# Patient Record
Sex: Male | Born: 1960 | Race: Black or African American | Hispanic: No | Marital: Single | Smoking: Never smoker
Health system: Southern US, Community
[De-identification: ages and names within clinical notes are randomized; demographics above are authoritative.]

## PROBLEM LIST (undated history)

## (undated) ENCOUNTER — Encounter

## (undated) ENCOUNTER — Encounter: Attending: Gastroenterology | Primary: Gastroenterology

## (undated) ENCOUNTER — Non-Acute Institutional Stay: Payer: PRIVATE HEALTH INSURANCE | Attending: Gastroenterology | Primary: Gastroenterology

## (undated) ENCOUNTER — Telehealth

## (undated) ENCOUNTER — Ambulatory Visit: Payer: PRIVATE HEALTH INSURANCE | Attending: Gastroenterology | Primary: Gastroenterology

## (undated) ENCOUNTER — Encounter: Payer: PRIVATE HEALTH INSURANCE | Attending: Gastroenterology | Primary: Gastroenterology

## (undated) ENCOUNTER — Ambulatory Visit

## (undated) ENCOUNTER — Telehealth: Attending: Gastroenterology | Primary: Gastroenterology

## (undated) ENCOUNTER — Ambulatory Visit: Payer: PRIVATE HEALTH INSURANCE

## (undated) ENCOUNTER — Ambulatory Visit: Attending: Gastroenterology | Primary: Gastroenterology

## (undated) DIAGNOSIS — I10 Essential (primary) hypertension: Secondary | ICD-10-CM

## (undated) DIAGNOSIS — I610 Nontraumatic intracerebral hemorrhage in hemisphere, subcortical: Secondary | ICD-10-CM

## (undated) DIAGNOSIS — Z973 Presence of spectacles and contact lenses: Secondary | ICD-10-CM

## (undated) DIAGNOSIS — Z8719 Personal history of other diseases of the digestive system: Secondary | ICD-10-CM

## (undated) DIAGNOSIS — IMO0002 Reserved for concepts with insufficient information to code with codable children: Secondary | ICD-10-CM

## (undated) DIAGNOSIS — M1990 Unspecified osteoarthritis, unspecified site: Secondary | ICD-10-CM

## (undated) DIAGNOSIS — K219 Gastro-esophageal reflux disease without esophagitis: Secondary | ICD-10-CM

## (undated) DIAGNOSIS — E785 Hyperlipidemia, unspecified: Secondary | ICD-10-CM

## (undated) HISTORY — DX: Reserved for concepts with insufficient information to code with codable children: IMO0002

## (undated) HISTORY — PX: APPENDECTOMY: SHX54

## (undated) HISTORY — PX: OTHER SURGICAL HISTORY: SHX169

## (undated) HISTORY — DX: Nontraumatic intracerebral hemorrhage in hemisphere, subcortical: I61.0

## (undated) HISTORY — DX: Gastro-esophageal reflux disease without esophagitis: K21.9

## (undated) HISTORY — DX: Hyperlipidemia, unspecified: E78.5

## (undated) HISTORY — PX: COLECTOMY: SHX59

## (undated) HISTORY — DX: Personal history of other diseases of the digestive system: Z87.19

## (undated) HISTORY — PX: HERNIA REPAIR: SHX51

## (undated) HISTORY — DX: Essential (primary) hypertension: I10

## (undated) HISTORY — DX: Unspecified osteoarthritis, unspecified site: M19.90

---
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
## 1997-11-23 ENCOUNTER — Inpatient Hospital Stay (HOSPITAL_COMMUNITY): Admission: EM | Admit: 1997-11-23 | Discharge: 1997-11-26 | Payer: Self-pay | Admitting: Emergency Medicine

## 1998-01-14 ENCOUNTER — Emergency Department (HOSPITAL_COMMUNITY): Admission: EM | Admit: 1998-01-14 | Discharge: 1998-01-14 | Payer: Self-pay | Admitting: Emergency Medicine

## 1998-01-14 ENCOUNTER — Ambulatory Visit (HOSPITAL_COMMUNITY): Admission: RE | Admit: 1998-01-14 | Discharge: 1998-01-14 | Payer: Self-pay | Admitting: General Surgery

## 1998-09-23 ENCOUNTER — Emergency Department (HOSPITAL_COMMUNITY): Admission: EM | Admit: 1998-09-23 | Discharge: 1998-09-23 | Payer: Self-pay | Admitting: Internal Medicine

## 1998-12-17 ENCOUNTER — Emergency Department (HOSPITAL_COMMUNITY): Admission: EM | Admit: 1998-12-17 | Discharge: 1998-12-17 | Payer: Self-pay | Admitting: Emergency Medicine

## 1999-02-25 ENCOUNTER — Encounter (INDEPENDENT_AMBULATORY_CARE_PROVIDER_SITE_OTHER): Payer: Self-pay | Admitting: Specialist

## 1999-02-25 ENCOUNTER — Other Ambulatory Visit: Admission: RE | Admit: 1999-02-25 | Discharge: 1999-02-25 | Payer: Self-pay | Admitting: Gastroenterology

## 2004-12-14 ENCOUNTER — Emergency Department (HOSPITAL_COMMUNITY): Admission: EM | Admit: 2004-12-14 | Discharge: 2004-12-14 | Payer: Self-pay | Admitting: Emergency Medicine

## 2004-12-20 ENCOUNTER — Inpatient Hospital Stay (HOSPITAL_COMMUNITY): Admission: EM | Admit: 2004-12-20 | Discharge: 2004-12-27 | Payer: Self-pay | Admitting: Emergency Medicine

## 2005-01-03 ENCOUNTER — Ambulatory Visit (HOSPITAL_COMMUNITY): Admission: AD | Admit: 2005-01-03 | Discharge: 2005-01-03 | Payer: Self-pay | Admitting: *Deleted

## 2005-02-22 ENCOUNTER — Emergency Department (HOSPITAL_COMMUNITY): Admission: EM | Admit: 2005-02-22 | Discharge: 2005-02-23 | Payer: Self-pay | Admitting: Emergency Medicine

## 2005-02-27 ENCOUNTER — Encounter: Admission: RE | Admit: 2005-02-27 | Discharge: 2005-02-27 | Payer: Self-pay | Admitting: *Deleted

## 2005-03-01 ENCOUNTER — Emergency Department (HOSPITAL_COMMUNITY): Admission: EM | Admit: 2005-03-01 | Discharge: 2005-03-01 | Payer: Self-pay | Admitting: Emergency Medicine

## 2005-05-20 ENCOUNTER — Encounter: Admission: RE | Admit: 2005-05-20 | Discharge: 2005-05-20 | Payer: Self-pay | Admitting: *Deleted

## 2005-06-12 ENCOUNTER — Encounter: Admission: RE | Admit: 2005-06-12 | Discharge: 2005-06-12 | Payer: Self-pay | Admitting: Surgery

## 2006-03-02 IMAGING — CR DG CHEST 1V PORT
1 series · 1 of 1 positions shown · non-contrast
Comparison: None

CLINICAL DATA: PICC placement

PORTABLE CHEST - 1 VIEW:

[view not recorded]
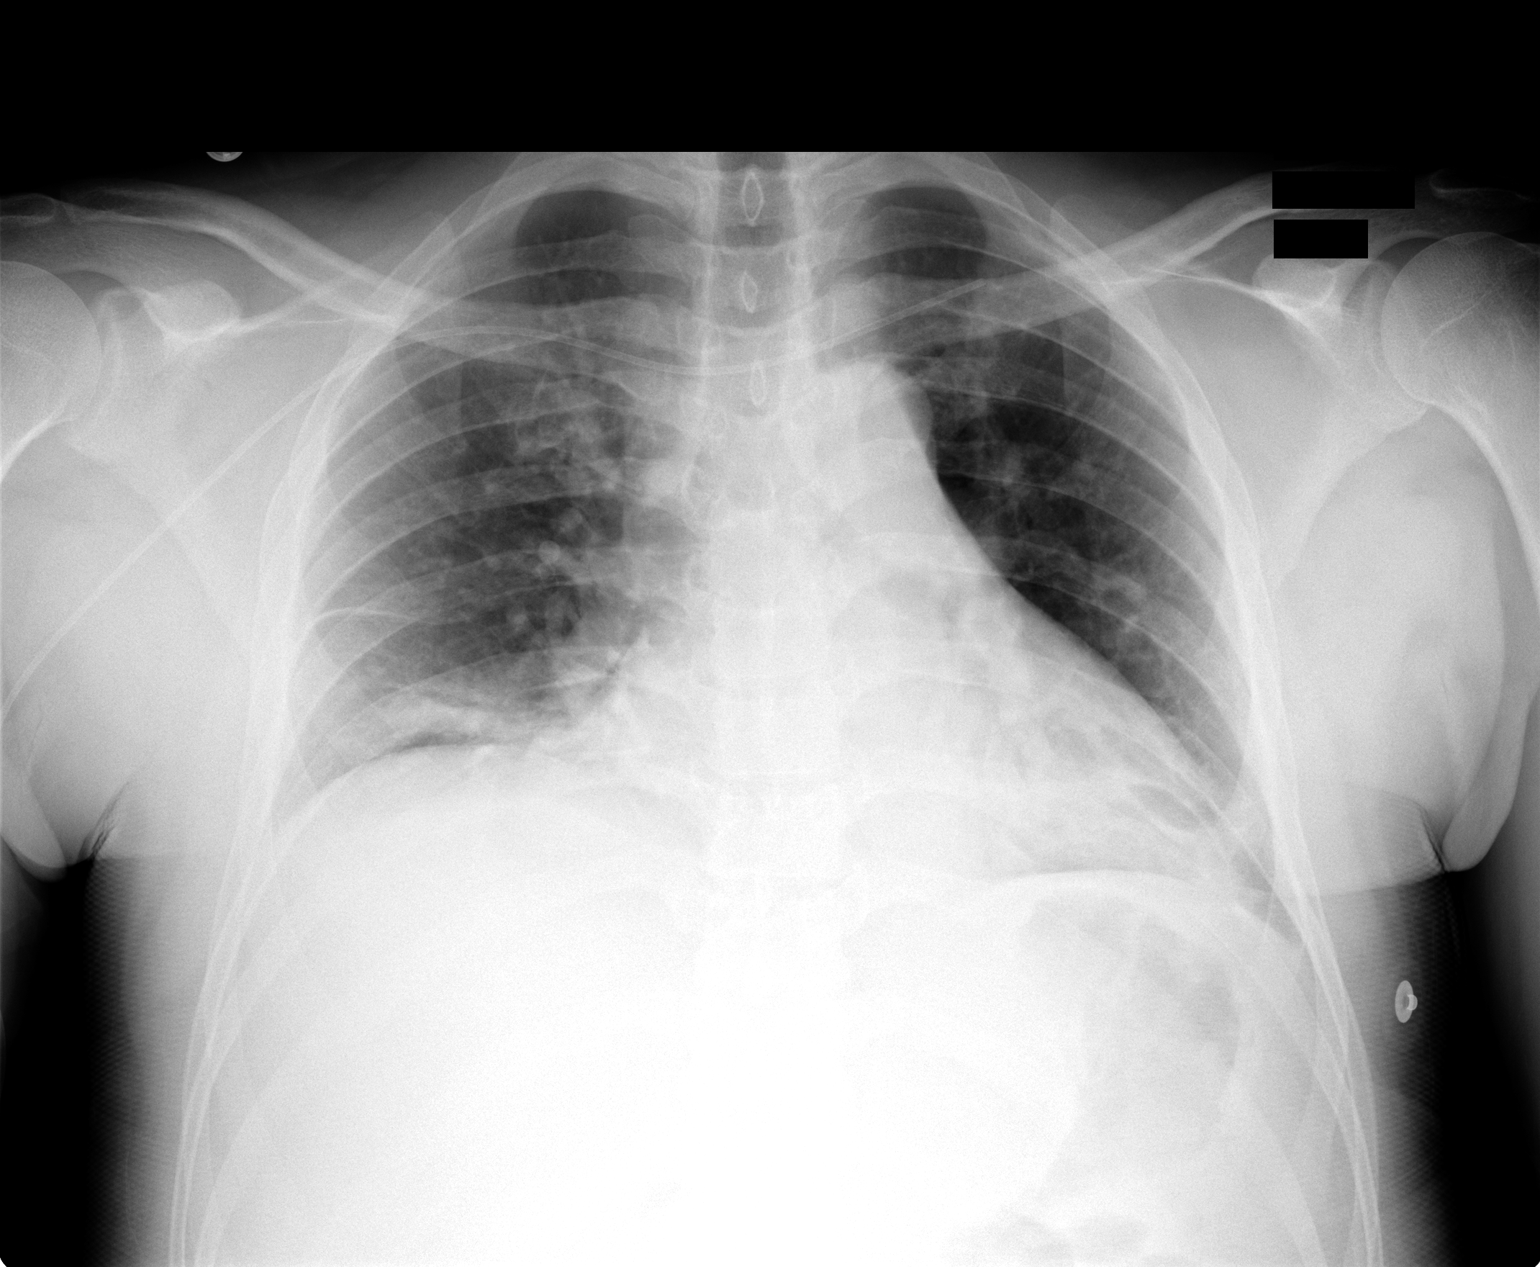

[1 of 1 positions shown; findings below may reference images not displayed]

FINDINGS: Right PICC line is in place. This crosses the midline and the tip
lies in the left subclavian vein. There is cardiomegaly. Bibasilar atelectasis
or infiltrates present.
IMPRESSION: Right PICC line crosses the midline with the tip in the left subclavian vein.
Recommend pulling back 12-15 cm and readvancing.

Bibasilar atelectasis or pneumonia. Cardiomegaly.

## 2006-06-22 ENCOUNTER — Emergency Department (HOSPITAL_COMMUNITY): Admission: EM | Admit: 2006-06-22 | Discharge: 2006-06-22 | Payer: Self-pay | Admitting: Emergency Medicine

## 2006-08-17 ENCOUNTER — Emergency Department (HOSPITAL_COMMUNITY): Admission: EM | Admit: 2006-08-17 | Discharge: 2006-08-17 | Payer: Self-pay | Admitting: Emergency Medicine

## 2006-08-23 ENCOUNTER — Emergency Department (HOSPITAL_COMMUNITY): Admission: EM | Admit: 2006-08-23 | Discharge: 2006-08-23 | Payer: Self-pay | Admitting: Emergency Medicine

## 2006-09-06 ENCOUNTER — Emergency Department (HOSPITAL_COMMUNITY): Admission: EM | Admit: 2006-09-06 | Discharge: 2006-09-06 | Payer: Self-pay | Admitting: Emergency Medicine

## 2007-01-18 ENCOUNTER — Emergency Department (HOSPITAL_COMMUNITY): Admission: EM | Admit: 2007-01-18 | Discharge: 2007-01-18 | Payer: Self-pay | Admitting: Emergency Medicine

## 2007-02-22 ENCOUNTER — Inpatient Hospital Stay (HOSPITAL_COMMUNITY): Admission: EM | Admit: 2007-02-22 | Discharge: 2007-02-23 | Payer: Self-pay | Admitting: Emergency Medicine

## 2007-10-11 ENCOUNTER — Emergency Department (HOSPITAL_COMMUNITY): Admission: EM | Admit: 2007-10-11 | Discharge: 2007-10-11 | Payer: Self-pay | Admitting: Emergency Medicine

## 2007-10-25 ENCOUNTER — Emergency Department (HOSPITAL_COMMUNITY): Admission: EM | Admit: 2007-10-25 | Discharge: 2007-10-25 | Payer: Self-pay | Admitting: Emergency Medicine

## 2007-10-29 ENCOUNTER — Ambulatory Visit: Payer: Self-pay | Admitting: Internal Medicine

## 2007-10-29 ENCOUNTER — Encounter: Payer: Self-pay | Admitting: Family Medicine

## 2007-11-01 ENCOUNTER — Ambulatory Visit: Payer: Self-pay | Admitting: *Deleted

## 2007-11-11 ENCOUNTER — Ambulatory Visit: Payer: Self-pay | Admitting: Internal Medicine

## 2007-11-12 IMAGING — CR DG ABDOMEN ACUTE W/ 1V CHEST
4 series · 4 of 4 positions shown · non-contrast
Comparison: 08/23/2006

CLINICAL DATA: Nausea and vomiting. History of ulcerative colitis with total
colectomy and prior small bowel resection.

ABDOMEN SERIES - 2 VIEW & CHEST - 1 VIEW

[w chest pa]
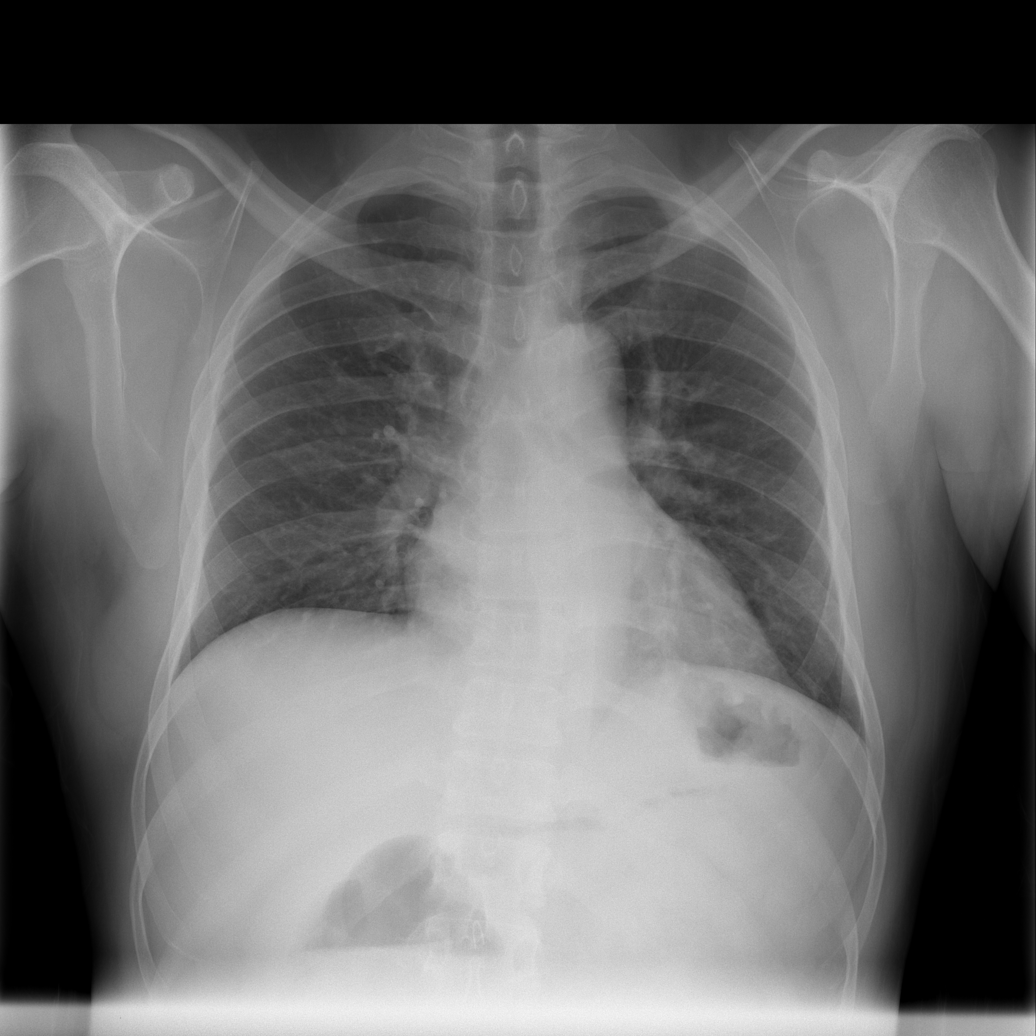

[w abdomen upright *]
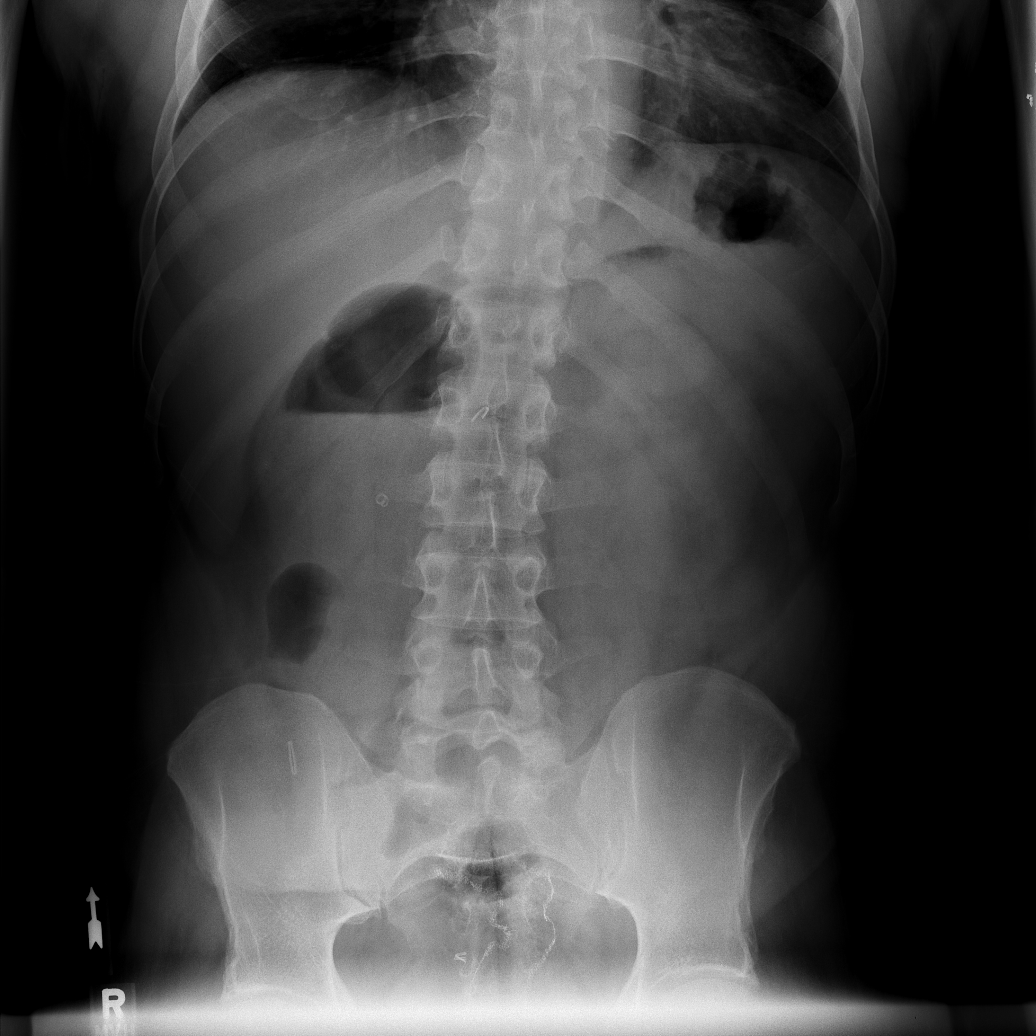

[t abdomen supine (1 of 2)]
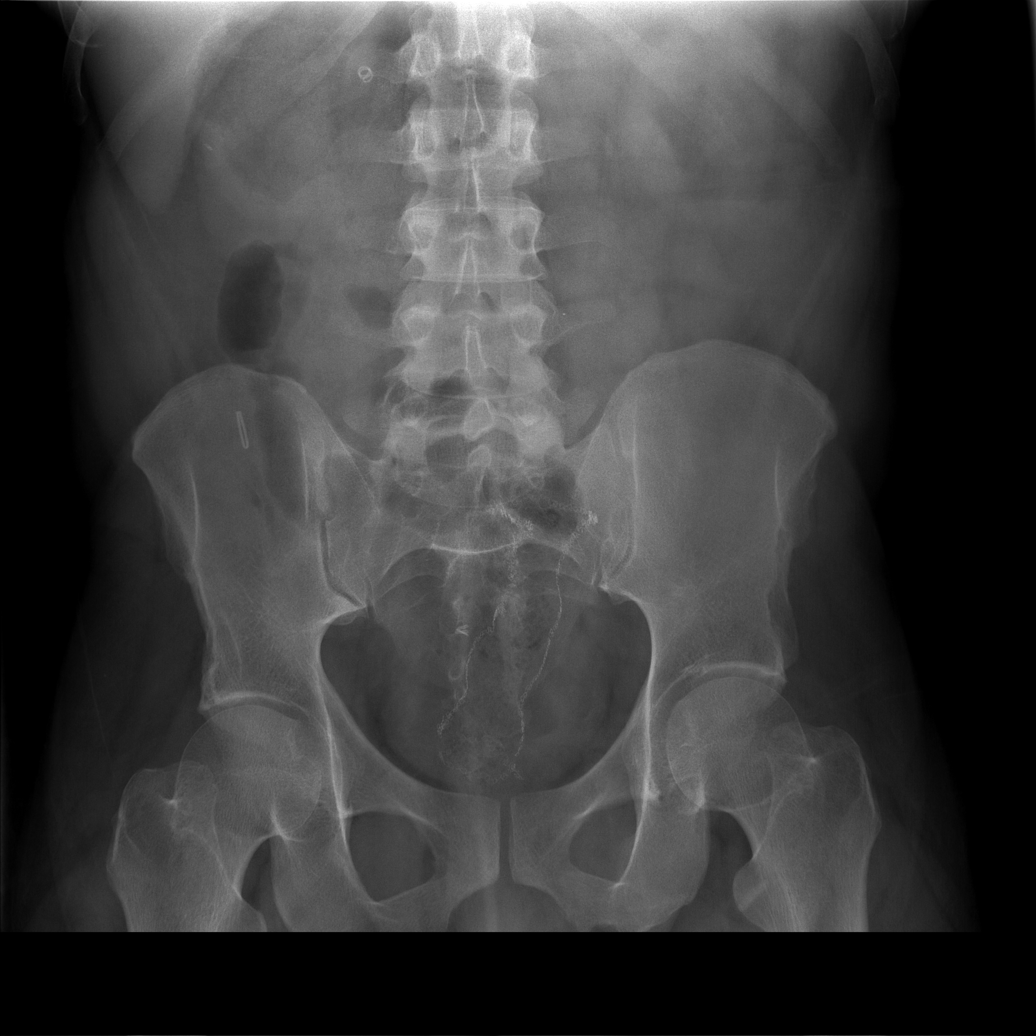

[t abdomen supine (2 of 2)]
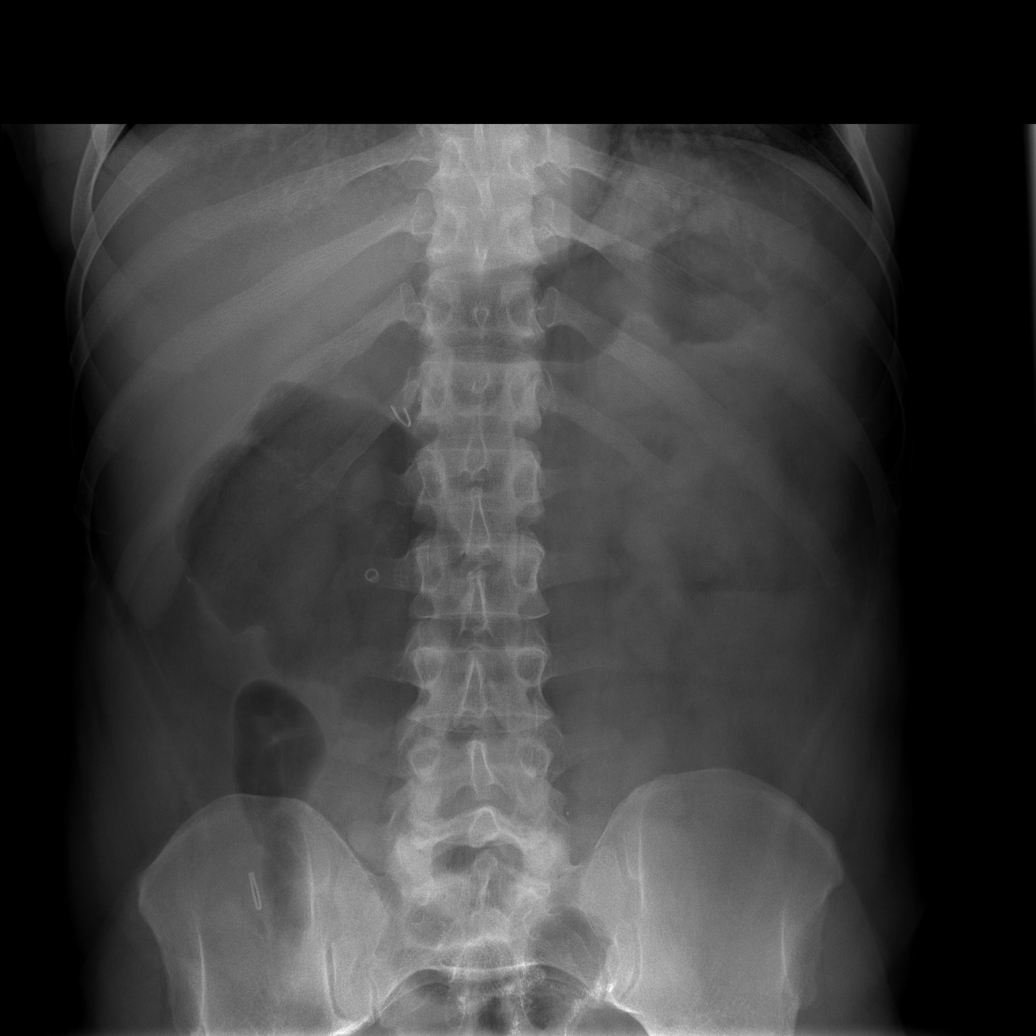

[4 of 4 positions shown; findings below may reference images not displayed]

FINDINGS: The lungs appear clear. The heart and mediastinum appear
unremarkable. No free intraperitoneal gas is identified. Clips are noted in the
pelvis and in the right upper quadrant. A chronically distended loop of small
bowel in the right upper quadrant with chronic air-fluid level is not
significantly changed from multiple prior examinations.

IMPRESSION

1. Stable chronic distention of a small bowel loop in the right abdomen with
chronic air-fluid level. No typical findings of obstruction. Postoperative
findings are present in the abdomen. Lungs appear clear.

## 2007-11-26 ENCOUNTER — Emergency Department (HOSPITAL_COMMUNITY): Admission: EM | Admit: 2007-11-26 | Discharge: 2007-11-26 | Payer: Self-pay | Admitting: Emergency Medicine

## 2007-12-22 ENCOUNTER — Emergency Department (HOSPITAL_COMMUNITY): Admission: EM | Admit: 2007-12-22 | Discharge: 2007-12-22 | Payer: Self-pay | Admitting: Emergency Medicine

## 2010-10-29 NOTE — H&P (Signed)
NAMELINCON, SAHLIN                 ACCOUNT NO.:  [id number]   MEDICAL RECORD NO.:  [id number]          PATIENT TYPE:  EMS   LOCATION:  ED                           FACILITY:  [facility]   PHYSICIAN:  Aquilla Hacker, M.D. DATE OF BIRTH:  02/19/1961   DATE OF ADMISSION:  02/22/2007  DATE OF DISCHARGE:                              HISTORY & PHYSICAL   PRIMARY CARE PHYSICIAN:  Unassigned.   CHIEF COMPLAINT:  Diarrhea.   HISTORY OF PRESENT ILLNESS:  Mr. Montoya is a 50 year old gentleman with a  past medical history of ulcerative colitis and is status post multiple  abdominal related surgeries who states that over the past 5 days he has  been having approximately 15-20 bowel movements a day.  He has also  experienced at least 2 episodes of vomiting over the past several days.  He has experienced diffuse abdominal cramps off and on over the past  several days.  His ability to sleep has been compromised secondary to  his having the numerous bowel movements.  He has been taking Lomotil  without relief.  He indicates that at baseline, he has approximately 7-9  bowel movements a day.  However, this is increased over the past 5 days.  He has noted an occasional blood streak in his stool, but he states that  this is not an appreciable amount of blood present.  With regards to the  abdominal cramps, he states that their intensity fluctuates.  He denies  any fevers or chills.  He states that he was seen approximately 3 weeks  ago in the ER with similar complaints.   PAST MEDICAL HISTORY:  1. Ulcerative colitis.  The patient is treated for this at the      [place].  2. Hypertension.  3. Glaucoma.  4. Steroid-dependent diabetes mellitus.  5. Pouchitis.  The patient states that he had a J pouch made from his      ileum.   PAST SURGICAL HISTORY:  1. Ileoanal anastomosis.  The patient indicates that his rectum was      completely removed.  2. Ileostomy bag was  placed in 2003, secondary to the patient having a      total colectomy.  The ileostomy bag has since been removed.  3. Partial small intestine resection.  4. Hernia repair times 5 or 6.  5. Tonsillectomy.   ALLERGIES:  1. ASACOL.  2. SULFA. Both of these cause the patient to feel hot and flushed.  3. PENICILLIN causes shortness of breath.   HOME MEDICATIONS:  1. Ciprofloxacin 500 mg p.o. b.i.d. for 5 years.  2. Atenolol.  3. Enalapril.  4. Pepcid over-the-counter.  5. Imodium.  6. Flagyl p.r.n.  7. Bentyl p.r.n.   SOCIAL HISTORY:  The patient works as a Tourist information centre manager.  Cigarettes:  Never.  Alcohol:  The patient drinks 3-4 beers a week.   FAMILY HISTORY:  Mother had diabetes mellitus.  Father diabetes  mellitus.   REVIEW OF SYSTEMS:  As per HPI.   PHYSICAL EXAMINATION:  GENERAL:  The patient is  awake.  He is talkative.  He is in no obvious respiratory distress .  VITAL SIGNS:  His temperature is 97.4, blood pressure 117/78, heart rate  94, respirations 18, O2 sat 97% on room air.  HEENT:  Normocephalic atraumatic.  Anicteric.  Extraocular movements are  intact.  Pupils are equally reactive to light.  Oral mucosa is pink,  dry.  No thrush.  No exudates.  NECK:  Supple.  No lymphadenopathy.  No thyromegaly.  CARDIAC:  S1 S2 present.  Regular rate and rhythm.  Heart sounds are  slightly distant.  RESPIRATORY:  No crackles or wheezes.  ABDOMEN:  There is a large well healed old midline surgical scar  present.  The patient also has multiple smaller old surgical wounds  present.  His abdomen is flat, soft.  Positive generalized tenderness in  all four quadrants, some slight involuntary guarding.  Bowel sounds are  present x4 quadrants.   LABORATORY:  Urinalysis is negative.  Lipase 20.  Sodium 142, potassium  4, chloride 113, CO2 26, glucose 88, BUN 10, creatinine 1.18.  Bilirubin  total 1, alk phos 67, SGOT 21, SGPT 18, total protein 6.8, albumin 3.8,  calcium 9.6.   White blood cell count is 5.1, hemoglobin 13.1, hematocrit  38.3, platelets 190.   ASSESSMENT/PLAN:  1. Acute on chronic diarrhea.  The etiology of this is questionable.      The differential includes acute gastroenteritis, versus duodenitis,      versus a relationship to the chronic antibiotics that the patient      has been taking, versus some otherwise undetermined etiology      related to the patient's prior multiple abdominal surgeries.  We      will check the patient's stools for Clostridium difficile as well      as Shigella, salmonella.  We will start the patient empirically on      Flagyl.  We will provide the patient with as needed pain      medications and we will consider a CT scan of the patient's      abdomen.  2. Hypertension.  The patient's blood pressure is currently stable.      We will therefore resume his previously prescribed antihypertensive      medications.  3. Nausea and vomiting.  We will provide the patient with as needed      Zofran for now.  4. Dehydration.  We will provide gentle intravenous fluid hydration      for now.  5. Gastrointestinal prophylaxis.  We will provide Protonix.  6. History of numerous abdominal surgeries.  Again, the relationship      that this is currently playing with regards to the patient's      abdominal pain as well as his diarrhea is questionable, therefore,      we will monitor this for now.     Aquilla Hacker, M.D.  Electronically Signed    OR/MEDQ  D:  02/22/2007  T:  02/22/2007  Job:  [id number]

## 2010-11-01 NOTE — Discharge Summary (Signed)
NAMEARIAS, WEINERT                 ACCOUNT NO.:  [id number]   MEDICAL RECORD NO.:  [id number]          PATIENT TYPE:  INP   LOCATION:  1245                         FACILITY[facility]  [facility]   PHYSICIAN:  Darrelyn Hillock, MDDATE OF BIRTH:  December 02, 1960   DATE OF ADMISSION:  12/20/2004  DATE OF DISCHARGE:  12/27/2004                                 DISCHARGE SUMMARY   ADMISSION DIAGNOSES:  1.  Small-bowel obstruction.  2.  Ventral hernia.   DISCHARGE DIAGNOSES:  1.  Small-bowel obstruction.  2.  Ventral hernia.  3.  Infected Gore-Tex and abdominal wall infection and large seroma.   Discharge is to home.  Condition on discharge is good and improved. Follow-  up is with me 2 weeks after discharge.   MEDICATIONS AT DISCHARGE:  Vancomycin 1 g IV daily.   HOSPITAL COURSE:  The patient was admitted, seen in the emergency room,  found to have what appeared to be a partial small-bowel obstruction with  ventral hernia. Was taken to the operating room where I found a large seroma  and infected Gore-Tex mesh that was removed. He was started on vancomycin  for MRSA with the wound. Over the next few days his NG tube was removed, his  diet was slowly advanced, and he was ready for discharge home.      Darrelyn Hillock, MD  Electronically Signed     KRH/MEDQ  D:  01/23/2005  T:  01/23/2005  Job:  [id number]

## 2010-11-01 NOTE — Op Note (Signed)
Matthew Francis, Matthew Francis                 ACCOUNT NO.:  [id number]   MEDICAL RECORD NO.:  [id number]          PATIENT TYPE:  INP   LOCATION:  5170                         FACILITY:  [facility]   PHYSICIAN:  Darrelyn Hillock, MDDATE OF BIRTH:  06-Jan-1961   DATE OF PROCEDURE:  12/21/2004  DATE OF DISCHARGE:                                 OPERATIVE REPORT   PREOPERATIVE DIAGNOSIS:  Probable ventral hernia.   POSTOPERATIVE DIAGNOSIS:  Infected mesh and seroma.   PROCEDURE:  Laparotomy, removal of previously placed Gore-Tex and drainage  of abdominal wall abscess.   SURGEON:  Darrelyn Hillock, MD.   ASSISTANTEdsel Petrin. Dalbert Batman, M.D.   DESCRIPTION OF PROCEDURE:  The patient was taken to the operating room,  placed in supine position after adequate anesthesia was induced using  endotracheal tube. The abdomen was prepped and draped in the normal sterile  fashion. Using the previously vertical midline incision, I dissected down to  the fascia superiorly and this was opened. A large amount of purulent  material was found in between the two layers of Gore-Tex mesh and this was  evacuated and sent for culture. On further examination, the area of fullness  in the left abdomen was more consistent with edema of the abdominal wall and  cellulitis and there was no evidence of ventral hernia. In the right lower  abdomen, a separate incision was made over this as well and that showed what  appeared to be an old seroma. Again there was no ventral hernia. After the  majority of the Gore-Tex mesh was removed, I closed him primarily with  interrupted #1 Novofil and #5 Ethibond retention sutures. The skin was left  open and packed. The patient tolerated the procedure well and went to PACU  in good condition.       KRH/MEDQ  D:  12/25/2004  T:  12/25/2004  Job:  [id number]

## 2010-11-01 NOTE — H&P (Signed)
Matthew Francis, Matthew Francis                 ACCOUNT NO.:  [id number]   MEDICAL RECORD NO.:  [id number]          PATIENT TYPE:  INP   LOCATION:  0105                         FACILITY:  [facility]   PHYSICIAN:  Darrelyn Hillock, MDDATE OF BIRTH:  1960-09-10   DATE OF ADMISSION:  12/20/2004  DATE OF DISCHARGE:                                HISTORY & PHYSICAL   CHIEF COMPLAINT:  Abdominal pain.  History of ventral hernia.   The patient presents as a 50 year old black male, status post what appears  to be a total abdominal colectomy for ulcerative colitis at [facility].  Per history, the patient underwent a diverting ileostomy and then a staged J-  pouch procedure.  Patient also underwent either laparoscopic or open ventral  hernia repair with mesh.  The patient presented to the emergency room six  days ago with abdominal discomfort, and CT scan showed a ventral hernia  which was easily reducible and no evidence of small bowel obstruction.  The  patient was discharged at that time.  The patient represents today with  continued pain, worsening in the left upper quadrant.  Acute abdominal  series shows ileus versus early partial small bowel obstruction.  The  patient is quite tender in his left upper quadrant, by his own history.   Under conscious sedation in the emergency room, I was able to mostly produce  the hernia in his left upper quadrant and right periumbilical regions.  The  patient tolerated this well with 150 mg of Demerol and 5 of Versed.  The  patient is now being admitted for observation and probable open ventral  hernia repair with mesh.   PAST MEDICAL HISTORY:  Significant for ulcerative colitis.   MEDICATIONS:  Per the patient, include Cipro only.   REVIEW OF SYSTEMS:  Significant for abdominal pain and mild nausea.  No  emesis.  No evidence of bowel obstruction.  Negative for respiratory,  cardiac, or GU symptomatology.   PHYSICAL EXAMINATION:  VITAL SIGNS:  Temperature  100.4, heart rate 104,  blood pressure 127/70.  GENERAL:  He is an age-appropriate black male in mild distress.  HEENT:  Benign.  Normocephalic and atraumatic.  Pupils are equal, round and  reactive to light and accommodation.  NECK:  Soft and supple without thyromegaly or cervical adenopathy.  LUNGS:  Clear to auscultation and percussion x2.  HEART:  Regular rate and rhythm without murmurs, rubs or gallops.  Normal  PMI.  ABDOMEN:  Soft but tender in the left upper quadrant with a difficult-to-  reduce hernia.  Was reproducible with conscious sedation, at least  partially.  He also has a separate hernia in the right periumbilical region.  EXTREMITIES:  Normal muscular tone and normal deep tendon reflexes.  There  is no evidence of axillary, supraclavicular, or inguinal adenopathy.   IMPRESSION:  History of ulcerative colitis, status post total abdominal  colectomy and J-pouch, probable ventral hernia.   PLAN:  Admission.  IV fluids.  Antibiotics.  Probable ventral hernia repair  in the next 24-48 hours.       KRH/MEDQ  D:  12/20/2004  T:  12/20/2004  Job:  [id number]

## 2011-01-29 ENCOUNTER — Encounter (INDEPENDENT_AMBULATORY_CARE_PROVIDER_SITE_OTHER): Payer: Self-pay | Admitting: Surgery

## 2011-02-06 ENCOUNTER — Emergency Department (HOSPITAL_COMMUNITY)
Admission: EM | Admit: 2011-02-06 | Discharge: 2011-02-06 | Disposition: A | Discharge status: Home / Self Care | Payer: Self-pay | Attending: Emergency Medicine | Admitting: Emergency Medicine

## 2011-02-06 DIAGNOSIS — K5289 Other specified noninfective gastroenteritis and colitis: Secondary | ICD-10-CM | POA: Insufficient documentation

## 2011-02-06 DIAGNOSIS — R197 Diarrhea, unspecified: Secondary | ICD-10-CM | POA: Insufficient documentation

## 2011-02-06 DIAGNOSIS — K625 Hemorrhage of anus and rectum: Secondary | ICD-10-CM | POA: Insufficient documentation

## 2011-02-06 DIAGNOSIS — I10 Essential (primary) hypertension: Secondary | ICD-10-CM | POA: Insufficient documentation

## 2011-02-06 DIAGNOSIS — K6289 Other specified diseases of anus and rectum: Secondary | ICD-10-CM | POA: Insufficient documentation

## 2011-02-06 DIAGNOSIS — K649 Unspecified hemorrhoids: Secondary | ICD-10-CM | POA: Insufficient documentation

## 2011-02-06 LAB — CBC
MCH: 29.4 pg (ref 26.0–34.0)
MCHC: 33 g/dL (ref 30.0–36.0)
Platelets: 178 10*3/uL (ref 150–400)

## 2011-02-06 LAB — URINALYSIS, ROUTINE W REFLEX MICROSCOPIC
Leukocytes, UA: NEGATIVE
Protein, ur: NEGATIVE mg/dL
Urobilinogen, UA: 0.2 mg/dL (ref 0.0–1.0)

## 2011-02-06 LAB — DIFFERENTIAL
Basophils Relative: 1 % (ref 0–1)
Eosinophils Absolute: 0.3 10*3/uL (ref 0.0–0.7)
Monocytes Absolute: 0.6 10*3/uL (ref 0.1–1.0)
Monocytes Relative: 11 % (ref 3–12)
Neutrophils Relative %: 43 % (ref 43–77)

## 2011-02-06 LAB — BASIC METABOLIC PANEL
Calcium: 9.4 mg/dL (ref 8.4–10.5)
GFR calc Af Amer: >60 mL/min (ref >60)
GFR calc non Af Amer: >60 mL/min (ref >60)
Sodium: 139 mEq/L (ref 135–145)

## 2011-02-06 LAB — OCCULT BLOOD X 1 CARD TO LAB, STOOL: Fecal Occult Bld: POSITIVE

## 2011-02-07 ENCOUNTER — Encounter (INDEPENDENT_AMBULATORY_CARE_PROVIDER_SITE_OTHER): Payer: Self-pay | Admitting: Surgery

## 2011-02-11 ENCOUNTER — Encounter (INDEPENDENT_AMBULATORY_CARE_PROVIDER_SITE_OTHER): Payer: Self-pay | Admitting: Surgery

## 2011-02-11 ENCOUNTER — Ambulatory Visit (INDEPENDENT_AMBULATORY_CARE_PROVIDER_SITE_OTHER): Payer: Self-pay | Admitting: Surgery

## 2011-02-11 VITALS — BP 150/110 | HR 68 | Temp 97.4°F | Ht 71.0 in | Wt 211.0 lb

## 2011-02-11 DIAGNOSIS — K4090 Unilateral inguinal hernia, without obstruction or gangrene, not specified as recurrent: Secondary | ICD-10-CM

## 2011-02-11 NOTE — Progress Notes (Signed)
Chief Complaint  Patient presents with  . Other    new pt- eval [facility]    HPI Matthew Francis is a 50 y.o. male.   HPI This is a very pleasant 50 year old gentleman who presents with a left groin bulge. He has noticed this bulge since April. He reports he is able to reduce it in the evenings. He has had mild discomfort but no tract symptoms. Otherwise he is without complaints. Past Medical History  Diagnosis Date  . Hypertension   . Inguinal hernia   . Hyperlipidemia   . Ulcer     ulcerative colitis  . Diabetes mellitus   . GERD (gastroesophageal reflux disease)   . Arthritis     Past Surgical History  Procedure Date  . Colectomy     Family History  Problem Relation Age of Onset  . Cancer Sister     Social History History  Substance Use Topics  . Smoking status: Never Smoker   . Smokeless tobacco: Never Used  . Alcohol Use: Yes    Allergies  Allergen Reactions  . Asacol (Mesalamine)   . Penicillins   . Sulfa Antibiotics     Current Outpatient Prescriptions  Medication Sig Dispense Refill  . atenolol (TENORMIN) 25 MG tablet Take 25 mg by mouth daily.        . ciprofloxacin (CIPRO) 500 MG/5ML (10%) suspension Take 500 mg by mouth 2 (two) times daily.        Marland Kitchen loperamide (IMODIUM) 1 MG/5ML solution Take by mouth 3 (three) times daily as needed.        . ranitidine (ZANTAC) 300 MG tablet Take 300 mg by mouth 2 (two) times daily.          Review of Systems Review of Systems  Constitutional: Negative.   HENT: Negative.   Eyes: Negative.   Respiratory: Negative.   Cardiovascular: Negative.   Gastrointestinal: Positive for heartburn and diarrhea.  Genitourinary: Negative.   Musculoskeletal: Positive for joint pain.  Skin: Negative.   Neurological: Negative.   Endo/Heme/Allergies: Negative.   Psychiatric/Behavioral: Negative.   All other systems reviewed and are negative.    Blood pressure 150/110, pulse 68, temperature 97.4 F (36.3 C), temperature source  Temporal, height 5' 11"  (1.803 m), weight 211 lb (95.709 kg).  Physical Exam Physical Exam  Constitutional: He is oriented to person, place, and time. He appears well-developed and well-nourished. No distress.  HENT:  Head: Normocephalic and atraumatic.  Right Ear: External ear normal.  Nose: Nose normal.  Mouth/Throat: Oropharynx is clear and moist. No oropharyngeal exudate.  Eyes: Conjunctivae are normal. Pupils are equal, round, and reactive to light. Left eye exhibits no discharge. No scleral icterus.  Neck: Normal range of motion. Neck supple. No JVD present. No tracheal deviation present. No thyromegaly present.  Cardiovascular: Normal rate, regular rhythm and intact distal pulses.   Murmur heard. Respiratory: Effort normal and breath sounds normal. No respiratory distress. He has no wheezes.  GI: Soft. He exhibits no distension and no mass. There is no tenderness. There is no rebound and no guarding. A hernia is present. Hernia confirmed positive in the ventral area and confirmed positive in the left inguinal area.       Multiple well healed scars  Musculoskeletal: Normal range of motion. He exhibits no edema and no tenderness.  Lymphadenopathy:    He has no cervical adenopathy.  Neurological: He is alert and oriented to person, place, and time.  Skin: Skin is warm. No  rash noted. No erythema.  Psychiatric: Thought content normal.     Data Reviewed   Assessment    Easily reducible left inguinal hernia    Plan       Repair is recommended with mesh. I discussed this with him in detail. Given his previous abdominal surgeries L. proceed with this open and not laparoscopic. He understands the risks of surgery which include but not limited to bleeding, infection, injury to surrounding structures, recurrence, nerve entrapment, chronic pain, et Ronney Asters. He wishes to proceed with surgery    BLACKMAN,DOUGLAS A 02/11/2011, 11:40 AM

## 2011-03-11 LAB — URINALYSIS, ROUTINE W REFLEX MICROSCOPIC
Bilirubin Urine: NEGATIVE
Ketones, ur: NEGATIVE
Nitrite: NEGATIVE
Urobilinogen, UA: 0.2
pH: 5

## 2011-03-11 LAB — COMPREHENSIVE METABOLIC PANEL
ALT: 23
Calcium: 9.2
Glucose, Bld: 73
Sodium: 140
Total Protein: 7

## 2011-03-11 LAB — CBC
Hemoglobin: 13
MCHC: 34.3
RDW: 14.6

## 2011-03-11 LAB — DIFFERENTIAL
Basophils Absolute: 0.1
Basophils Relative: 1
Eosinophils Absolute: 0.1
Eosinophils Relative: 2
Lymphocytes Relative: 42
Lymphs Abs: 2.1
Monocytes Absolute: 0.4
Monocytes Relative: 8
Myelocytes: 0
Neutro Abs: 2.2
Neutrophils Relative %: 47

## 2011-03-28 LAB — DIFFERENTIAL
Basophils Absolute: 0
Basophils Relative: 0
Lymphocytes Relative: 24
Monocytes Absolute: 0.2
Neutro Abs: 3.5
Neutrophils Relative %: 69

## 2011-03-28 LAB — URINALYSIS, ROUTINE W REFLEX MICROSCOPIC
Glucose, UA: NEGATIVE
Hgb urine dipstick: NEGATIVE
Ketones, ur: NEGATIVE
Protein, ur: NEGATIVE

## 2011-03-28 LAB — COMPREHENSIVE METABOLIC PANEL
Albumin: 3.8
BUN: 10
Chloride: 113 — ABNORMAL HIGH
Creatinine, Ser: 1.18
Glucose, Bld: 88
Total Bilirubin: 1
Total Protein: 6.8

## 2011-03-28 LAB — CBC
HCT: 38.3 — ABNORMAL LOW
Hemoglobin: 13.1
MCV: 89
Platelets: 190
RDW: 14.7 — ABNORMAL HIGH

## 2011-03-28 LAB — STOOL CULTURE

## 2011-03-28 LAB — CLOSTRIDIUM DIFFICILE EIA: C difficile Toxins A+B, EIA: NEGATIVE

## 2011-03-31 LAB — COMPREHENSIVE METABOLIC PANEL
AST: 21
BUN: 5 — ABNORMAL LOW
CO2: 21
Calcium: 9.3
Chloride: 111
Creatinine, Ser: 1.26
GFR calc Af Amer: >60
GFR calc non Af Amer: >60
Glucose, Bld: 112 — ABNORMAL HIGH
Total Bilirubin: 0.8

## 2011-03-31 LAB — CBC
HCT: 39.4
Hemoglobin: 13.5
MCHC: 34.2
MCV: 89.2
RBC: 4.42
WBC: 4.7

## 2011-03-31 LAB — DIFFERENTIAL
Basophils Absolute: 0
Eosinophils Relative: 5
Lymphocytes Relative: 24
Lymphs Abs: 1.1
Neutrophils Relative %: 64

## 2011-05-01 ENCOUNTER — Encounter (HOSPITAL_COMMUNITY): Payer: Self-pay | Admitting: Emergency Medicine

## 2011-05-01 ENCOUNTER — Emergency Department (HOSPITAL_COMMUNITY): Payer: Self-pay

## 2011-05-01 ENCOUNTER — Emergency Department (HOSPITAL_COMMUNITY)
Admission: EM | Admit: 2011-05-01 | Discharge: 2011-05-01 | Disposition: A | Discharge status: Home / Self Care | Payer: Self-pay | Attending: Emergency Medicine | Admitting: Emergency Medicine

## 2011-05-01 DIAGNOSIS — K6289 Other specified diseases of anus and rectum: Secondary | ICD-10-CM | POA: Insufficient documentation

## 2011-05-01 DIAGNOSIS — I10 Essential (primary) hypertension: Secondary | ICD-10-CM | POA: Insufficient documentation

## 2011-05-01 DIAGNOSIS — R109 Unspecified abdominal pain: Secondary | ICD-10-CM | POA: Insufficient documentation

## 2011-05-01 DIAGNOSIS — K219 Gastro-esophageal reflux disease without esophagitis: Secondary | ICD-10-CM | POA: Insufficient documentation

## 2011-05-01 DIAGNOSIS — Z79899 Other long term (current) drug therapy: Secondary | ICD-10-CM | POA: Insufficient documentation

## 2011-05-01 DIAGNOSIS — E119 Type 2 diabetes mellitus without complications: Secondary | ICD-10-CM | POA: Insufficient documentation

## 2011-05-01 DIAGNOSIS — E785 Hyperlipidemia, unspecified: Secondary | ICD-10-CM | POA: Insufficient documentation

## 2011-05-01 LAB — COMPREHENSIVE METABOLIC PANEL
AST: 23 U/L (ref 0–37)
CO2: 23 mEq/L (ref 19–32)
Calcium: 10.1 mg/dL (ref 8.4–10.5)
Creatinine, Ser: 1.1 mg/dL (ref 0.50–1.35)
GFR calc Af Amer: >90 mL/min (ref >90)
GFR calc non Af Amer: 78 mL/min — ABNORMAL LOW (ref >90)
Total Protein: 7.4 g/dL (ref 6.0–8.3)

## 2011-05-01 LAB — CBC
Hemoglobin: 13.4 g/dL (ref 13.0–17.0)
MCH: 30 pg (ref 26.0–34.0)
MCHC: 33.3 g/dL (ref 30.0–36.0)
MCV: 90.2 fL (ref 78.0–100.0)
RBC: 4.47 MIL/uL (ref 4.22–5.81)

## 2011-05-01 LAB — DIFFERENTIAL
Basophils Relative: 1 % (ref 0–1)
Eosinophils Absolute: 0.1 10*3/uL (ref 0.0–0.7)
Eosinophils Relative: 2 % (ref 0–5)
Lymphs Abs: 1.5 10*3/uL (ref 0.7–4.0)
Monocytes Absolute: 0.4 10*3/uL (ref 0.1–1.0)
Monocytes Relative: 8 % (ref 3–12)

## 2011-05-01 LAB — URINALYSIS, ROUTINE W REFLEX MICROSCOPIC
Bilirubin Urine: NEGATIVE
Glucose, UA: NEGATIVE mg/dL
Hgb urine dipstick: NEGATIVE
Nitrite: NEGATIVE
Specific Gravity, Urine: 1.011 (ref 1.005–1.030)
pH: 5 (ref 5.0–8.0)

## 2011-05-01 MED ORDER — ONDANSETRON HCL 4 MG/2ML IJ SOLN
4.0000 mg | INTRAMUSCULAR | Status: DC | PRN
  Administered 2011-05-01: 4 mg via INTRAVENOUS
  Filled 2011-05-01: qty 2

## 2011-05-01 MED ORDER — SODIUM CHLORIDE 0.9 % IV SOLN
INTRAVENOUS | Status: DC
  Administered 2011-05-01 (×2): via INTRAVENOUS

## 2011-05-01 MED ORDER — FENTANYL CITRATE 0.05 MG/ML IJ SOLN: 50.0000 ug | INTRAMUSCULAR | Status: DC | PRN

## 2011-05-01 MED ORDER — FENTANYL CITRATE 0.05 MG/ML IJ SOLN
100.0000 ug | INTRAMUSCULAR | Status: AC | PRN
  Administered 2011-05-01 (×2): 100 ug via INTRAVENOUS
  Filled 2011-05-01 (×2): qty 2

## 2011-05-01 MED ORDER — DICYCLOMINE HCL 10 MG/ML IM SOLN
20.0000 mg | Freq: Once | INTRAMUSCULAR | Status: AC
  Administered 2011-05-01: 20 mg via INTRAMUSCULAR
  Filled 2011-05-01: qty 2

## 2011-05-01 MED ORDER — HYDROMORPHONE HCL PF 1 MG/ML IJ SOLN
1.0000 mg | Freq: Once | INTRAMUSCULAR | Status: AC
  Administered 2011-05-01: 1 mg via INTRAVENOUS
  Filled 2011-05-01: qty 1

## 2011-05-01 MED ORDER — OXYCODONE-ACETAMINOPHEN 5-325 MG PO TABS: ORAL_TABLET | ORAL | Status: AC

## 2011-05-01 MED ORDER — DICYCLOMINE HCL 20 MG PO TABS: 20.0000 mg | ORAL_TABLET | Freq: Four times a day (QID) | ORAL | Status: DC

## 2011-05-01 NOTE — ED Provider Notes (Signed)
History     CSN: [id number] Arrival date & time: 05/01/2011  9:17 AM   Chief Complaint  Patient presents with  . Abdominal Pain  . Rectal Pain    HPI Pt was seen at 1005.  Per pt, c/o gradual onset and persistence of constant generalized abd "pain" x1 week.  Describes the pain as "spasms," and "like when my UC flairs up."  Has been assoc with nausea, loose stools, and occas blood in stools.  Denies fevers, no vomiting, no rash, no CP/SOB, no back pain.      Past Medical History  Diagnosis Date  . Hypertension   . Inguinal hernia   . Hyperlipidemia   . Ulcer     ulcerative colitis  . Diabetes mellitus   . GERD (gastroesophageal reflux disease)   . Arthritis   . Ulcerative colitis     Past Surgical History  Procedure Date  . Colectomy   . Ileoanal anastomosis   . Appendectomy     Family History  Problem Relation Age of Onset  . Cancer Sister     History  Substance Use Topics  . Smoking status: Never Smoker   . Smokeless tobacco: Never Used  . Alcohol Use: 2.4 oz/week    4 Cans of beer per week    Review of Systems ROS: Statement: All systems negative except as marked or noted in the HPI; Constitutional: Negative for fever and chills. ; ; Eyes: Negative for eye pain, redness and discharge. ; ; ENMT: Negative for ear pain, hoarseness, nasal congestion, sinus pressure and sore throat. ; ; Cardiovascular: Negative for chest pain, palpitations, diaphoresis, dyspnea and peripheral edema. ; ; Respiratory: Negative for cough, wheezing and stridor. ; ; Gastrointestinal:  +nausea, diarrhea, blood in stool, abd pain. Negative for vomiting, hematemesis, jaundice and rectal bleeding.; Genitourinary: Negative for dysuria, flank pain and hematuria. ; ; Musculoskeletal: Negative for back pain and neck pain. Negative for swelling and trauma.; ; Skin: Negative for pruritus, rash, abrasions, blisters, bruising and skin lesion.; ; Neuro: Negative for headache, lightheadedness and neck  stiffness. Negative for weakness, altered level of consciousness , altered mental status, extremity weakness, paresthesias, involuntary movement, seizure and syncope.     Allergies  Asacol; Penicillins; Sulfa antibiotics; and Iodinated diagnostic agents  Home Medications   Current Outpatient Rx  Name Route Sig Dispense Refill  . ACETAMINOPHEN 500 MG PO TABS Oral Take 1,000 mg by mouth every 6 (six) hours as needed. Pain      . ATENOLOL 25 MG PO TABS Oral Take 25 mg by mouth daily.      Marland Kitchen CIPROFLOXACIN HCL 500 MG PO TABS Oral Take 500 mg by mouth 2 (two) times daily.      Marland Kitchen LOPERAMIDE HCL 2 MG PO CAPS Oral Take 4 mg by mouth 3 (three) times daily.      Marland Kitchen PRAMOXINE HCL 1 % RE OINT Rectal Place 1 application rectally every 2 (two) hours as needed. hemorrhoids      . RANITIDINE HCL 300 MG PO TABS Oral Take 300 mg by mouth 2 (two) times daily.      Marland Kitchen DICYCLOMINE HCL 20 MG PO TABS Oral Take 1 tablet (20 mg total) by mouth every 6 (six) hours. 15 tablet 0  . OXYCODONE-ACETAMINOPHEN 5-325 MG PO TABS  1 or 2 tabs PO q6h prn pain 20 tablet 0    BP 120/69  Pulse 88  Temp(Src) 98.2 F (36.8 C) (Oral)  Resp 20  Ht 5'  11" (1.803 m)  Wt 200 lb (90.719 kg)  BMI 27.89 kg/m2  SpO2 95%  Physical Exam 1010: Physical examination:  Nursing notes reviewed; Vital signs and O2 SAT reviewed;  Constitutional: Well developed, Well nourished, Well hydrated, Uncomfortable appearing; Head:  Normocephalic, atraumatic; Eyes: EOMI, PERRL, No scleral icterus; ENMT: Mouth and pharynx normal, Mucous membranes moist; Neck: Supple, Full range of motion, No lymphadenopathy; Cardiovascular: Regular rate and rhythm, No murmur, rub, or gallop; Respiratory: Breath sounds clear & equal bilaterally, No rales, rhonchi, wheezes, or rub, Normal respiratory effort/excursion; Chest: Nontender, Movement normal; Abdomen: Soft, +diffusely tender to palp.  No rebound or guarding, Nondistended, Normal bowel sounds, multiple well healed  surgical scars on abd wall; Extremities: Pulses normal, No tenderness, No edema, No calf edema or asymmetry.; Neuro: AA&Ox3, Major CN grossly intact.  No gross focal motor or sensory deficits in extremities.; Skin: Color normal, Warm, Dry, no rash.    ED Course  Procedures   MDM  MDM Reviewed: nursing note and vitals Interpretation: labs, CT scan and x-ray   Results for orders placed during the hospital encounter of 05/01/11  CBC      Component Value Range   WBC 5.1  4.0 - 10.5 (K/uL)   RBC 4.47  4.22 - 5.81 (MIL/uL)   Hemoglobin 13.4  13.0 - 17.0 (g/dL)   HCT 40.3  39.0 - 52.0 (%)   MCV 90.2  78.0 - 100.0 (fL)   MCH 30.0  26.0 - 34.0 (pg)   MCHC 33.3  30.0 - 36.0 (g/dL)   RDW 14.7  11.5 - 15.5 (%)   Platelets 151  150 - 400 (K/uL)  DIFFERENTIAL      Component Value Range   Neutrophils Relative 61  43 - 77 (%)   Neutro Abs 3.1  1.7 - 7.7 (K/uL)   Lymphocytes Relative 29  12 - 46 (%)   Lymphs Abs 1.5  0.7 - 4.0 (K/uL)   Monocytes Relative 8  3 - 12 (%)   Monocytes Absolute 0.4  0.1 - 1.0 (K/uL)   Eosinophils Relative 2  0 - 5 (%)   Eosinophils Absolute 0.1  0.0 - 0.7 (K/uL)   Basophils Relative 1  0 - 1 (%)   Basophils Absolute 0.0  0.0 - 0.1 (K/uL)  LACTIC ACID, PLASMA      Component Value Range   Lactic Acid, Venous 1.6  0.5 - 2.2 (mmol/L)  LIPASE, BLOOD      Component Value Range   Lipase 24  11 - 59 (U/L)  URINALYSIS, ROUTINE W REFLEX MICROSCOPIC      Component Value Range   Color, Urine YELLOW  YELLOW    Appearance CLEAR  CLEAR    Specific Gravity, Urine 1.011  1.005 - 1.030    pH 5.0  5.0 - 8.0    Glucose, UA NEGATIVE  NEGATIVE (mg/dL)   Hgb urine dipstick NEGATIVE  NEGATIVE    Bilirubin Urine NEGATIVE  NEGATIVE    Ketones, ur NEGATIVE  NEGATIVE (mg/dL)   Protein, ur NEGATIVE  NEGATIVE (mg/dL)   Urobilinogen, UA 0.2  0.0 - 1.0 (mg/dL)   Nitrite NEGATIVE  NEGATIVE    Leukocytes, UA NEGATIVE  NEGATIVE   COMPREHENSIVE METABOLIC PANEL      Component Value  Range   Sodium 138  135 - 145 (mEq/L)   Potassium 3.9  3.5 - 5.1 (mEq/L)   Chloride 104  96 - 112 (mEq/L)   CO2 23  19 - 32 (mEq/L)  Glucose, Bld 91  70 - 99 (mg/dL)   BUN 10  6 - 23 (mg/dL)   Creatinine, Ser 1.10  0.50 - 1.35 (mg/dL)   Calcium 10.1  8.4 - 10.5 (mg/dL)   Total Protein 7.4  6.0 - 8.3 (g/dL)   Albumin 3.9  3.5 - 5.2 (g/dL)   AST 23  0 - 37 (U/L)   ALT 15  0 - 53 (U/L)   Alkaline Phosphatase 73  39 - 117 (U/L)   Total Bilirubin 0.5  0.3 - 1.2 (mg/dL)   GFR calc non Af Amer 78 (*) >90 (mL/min)   GFR calc Af Amer >90  >90 (mL/min)   Ct Abdomen Pelvis Wo Contrast  05/01/2011  *RADIOLOGY REPORT*  Clinical Data: Abdominal pain, history of ulcer colitis with colectomy  CT ABDOMEN AND PELVIS WITHOUT CONTRAST  Technique:  Multidetector CT imaging of the abdomen and pelvis was performed following the standard protocol without intravenous contrast.  Comparison: CT abdomen pelvis of 02/23/1999  Findings: The lung bases are clear.  There is mild cardiomegaly present.  The liver is unremarkable in the unenhanced state.  No calcified gallstones are seen.  The pancreas is normal in size and the pancreatic duct is not dilated.  The adrenal glands and spleen are unremarkable.  The stomach is moderately fluid distended with no abnormality noted.  On this unenhanced study, no renal calculi are seen and there is no evidence of hydronephrosis.  The abdominal aorta is normal in caliber.  No adenopathy is seen.  Multiple sutures are noted within the rectosigmoid colon from prior surgery for ulcerative colitis.  Much of the colon however has been resected.  The anastomosis of small bowel with rectosigmoid colon is unremarkable.  There is only minimal prominence of the mucosa of the remaining portion of the rectosigmoid colon but no definite edema is seen and this area not well distended.  No abnormality of small bowel is seen.  A prior ostomy site is noted with scarring in the superficial soft tissues of  the right lower quadrant.  The urinary bladder is unremarkable.  The distal ureters are normal in caliber.  The prostate is normal in size.  No fluid is seen within the pelvis.  No bony abnormality is seen.  IMPRESSION:  1.  The mucosa of the remaining portion of the rectosigmoid colon is minimally prominent but no definite edema is seen and no bowel obstruction is noted. 2.  Mild cardiomegaly.  Original Report Authenticated By: Joretta Bachelor, M.D.   Dg Abd Acute W/chest  05/01/2011  *RADIOLOGY REPORT*  Clinical Data: Rule out small bowel obstruction  ACUTE ABDOMEN SERIES (ABDOMEN 2 VIEW & CHEST 1 VIEW)  Comparison: 10/11/2007  Findings: Cardiomediastinal silhouette is stable.  No acute infiltrate or pleural effusion.  No pulmonary edema.  There is nonspecific nonobstructive bowel gas pattern.  No free abdominal air is noted.  Multiple sutures are again noted within midline pelvis.  No free abdominal air is noted.  IMPRESSION: No acute disease.  Nonspecific nonobstructive bowel gas pattern. No free abdominal air.  Original Report Authenticated By: Lahoma Crocker, M.D.   [id number]:  Improved after dilaudid IV.  Ambulating the hallways with steady upright gait, easy resps, VSS.  Dx testing d/w pt and family.  Questions answered.  Verb understanding.  States he "needs steroids and antibiotics when I get like this."  T/C to GI Dr. Collene Mares, case discussed, including:  HPI, pertinent PM/SHx, VS/PE, dx testing, ED course and treatment;  states there is no indication to start abx or steroids at this time with these lab and CT findings today; tx would be to start a 5-ASA suppository but pt states he is allergic to it, pt can be d/c to f/u at his GI MD office in [facility].  T/C to GI MD d/w pt and family, verb understanding.  Agreeable to d/c home.         [place], DO 05/02/11 2117

## 2011-05-01 NOTE — ED Notes (Signed)
Patient transported to X-ray 

## 2011-05-02 LAB — URINE CULTURE
Colony Count: NO GROWTH
Culture  Setup Time: 201211151412

## 2011-05-09 ENCOUNTER — Encounter (HOSPITAL_COMMUNITY): Payer: Self-pay | Admitting: *Deleted

## 2011-05-09 ENCOUNTER — Emergency Department (HOSPITAL_COMMUNITY)
Admission: EM | Admit: 2011-05-09 | Discharge: 2011-05-09 | Disposition: A | Discharge status: Home / Self Care | Payer: Self-pay | Attending: Emergency Medicine | Admitting: Emergency Medicine

## 2011-05-09 DIAGNOSIS — K219 Gastro-esophageal reflux disease without esophagitis: Secondary | ICD-10-CM | POA: Insufficient documentation

## 2011-05-09 DIAGNOSIS — Z79899 Other long term (current) drug therapy: Secondary | ICD-10-CM | POA: Insufficient documentation

## 2011-05-09 DIAGNOSIS — K5190 Ulcerative colitis, unspecified, without complications: Secondary | ICD-10-CM | POA: Insufficient documentation

## 2011-05-09 DIAGNOSIS — M129 Arthropathy, unspecified: Secondary | ICD-10-CM | POA: Insufficient documentation

## 2011-05-09 DIAGNOSIS — E785 Hyperlipidemia, unspecified: Secondary | ICD-10-CM | POA: Insufficient documentation

## 2011-05-09 DIAGNOSIS — E119 Type 2 diabetes mellitus without complications: Secondary | ICD-10-CM | POA: Insufficient documentation

## 2011-05-09 DIAGNOSIS — I10 Essential (primary) hypertension: Secondary | ICD-10-CM | POA: Insufficient documentation

## 2011-05-09 MED ORDER — PREDNISONE 20 MG PO TABS: ORAL_TABLET | ORAL | Status: AC

## 2011-05-09 MED ORDER — HYDROMORPHONE HCL PF 2 MG/ML IJ SOLN
2.0000 mg | Freq: Once | INTRAMUSCULAR | Status: AC
  Administered 2011-05-09: 2 mg via INTRAMUSCULAR
  Filled 2011-05-09: qty 1

## 2011-05-09 MED ORDER — PREDNISONE 20 MG PO TABS
60.0000 mg | ORAL_TABLET | Freq: Once | ORAL | Status: AC
  Administered 2011-05-09: 60 mg via ORAL
  Filled 2011-05-09: qty 3

## 2011-05-09 MED ORDER — HYDROMORPHONE HCL PF 1 MG/ML IJ SOLN: 1.0000 mg | Freq: Once | INTRAMUSCULAR | Status: DC

## 2011-05-09 MED ORDER — GI COCKTAIL ~~LOC~~
30.0000 mL | Freq: Once | ORAL | Status: AC
  Administered 2011-05-09: 30 mL via ORAL
  Filled 2011-05-09: qty 30

## 2011-05-09 NOTE — ED Notes (Signed)
Pt in c/o abd pain and diarrhea since last Thursday, also noted blood in stools, states he was dx with ulcerative colitis but symptoms are not improving

## 2011-05-09 NOTE — ED Provider Notes (Signed)
History     CSN: [id number] Arrival date & time: 05/09/2011  2:49 AM   First MD Initiated Contact with Patient 05/09/11 0345      Chief Complaint  Patient presents with  . Abdominal Pain    (Consider location/radiation/quality/duration/timing/severity/associated sxs/prior treatment) Patient is a 50 y.o. male presenting with abdominal pain. The history is provided by the patient.  Abdominal Pain The primary symptoms of the illness include abdominal pain, nausea and diarrhea. The primary symptoms of the illness do not include fever, shortness of breath or dysuria. Episode onset: About a week ago. The onset of the illness was gradual. The problem has not changed since onset. Associated with: Loose stools. The patient has had a change in bowel habit. Risk factors for an acute abdominal problem include a history of abdominal surgery. Symptoms associated with the illness do not include chills, anorexia, diaphoresis, heartburn, constipation, urgency, hematuria, frequency or back pain. Significant associated medical issues include inflammatory bowel disease.   patient with long-standing history of ulcerative colitis status post colectomy. His symptoms feel like a typical flare that he gets 3-4 times year. When he has a flare he is typically prescribed steroids to help with symptoms. Patient was seen here last week for the symptoms was not given steroids. He did take Percocet and Bentyl as prescribed with some minimal relief. Symptoms are moderate in nature. No new symptoms. No fevers or chills. Some nausea but no vomiting. No blood in stools. Pain described as sharp and burning in nature, hurts all over his abdomen. No radiation. No aggravating factors and no known alleviating factors otherwise. Patient had a CT scan and lab work on previous visit and declines any repeat blood work or CT scan at this time. No constipation.  Past Medical History  Diagnosis Date  . Hypertension   . Inguinal hernia   .  Hyperlipidemia   . Ulcer     ulcerative colitis  . Diabetes mellitus   . GERD (gastroesophageal reflux disease)   . Arthritis   . Ulcerative colitis     Past Surgical History  Procedure Date  . Colectomy   . Ileoanal anastomosis   . Appendectomy     Family History  Problem Relation Age of Onset  . Cancer Sister     History  Substance Use Topics  . Smoking status: Never Smoker   . Smokeless tobacco: Never Used  . Alcohol Use: 2.4 oz/week    4 Cans of beer per week      Review of Systems  Constitutional: Negative for fever, chills and diaphoresis.  HENT: Negative for neck pain and neck stiffness.   Eyes: Negative for pain.  Respiratory: Negative for shortness of breath.   Cardiovascular: Negative for chest pain.  Gastrointestinal: Positive for nausea, abdominal pain and diarrhea. Negative for heartburn, constipation, blood in stool and anorexia.  Genitourinary: Negative for dysuria, urgency, frequency and hematuria.  Musculoskeletal: Negative for back pain.  Skin: Negative for rash.  Neurological: Negative for headaches.  All other systems reviewed and are negative.    Allergies  Asacol; Penicillins; Sulfa antibiotics; and Iodinated diagnostic agents  Home Medications   Current Outpatient Rx  Name Route Sig Dispense Refill  . ACETAMINOPHEN 500 MG PO TABS Oral Take 1,000 mg by mouth every 6 (six) hours as needed. Pain      . ATENOLOL 25 MG PO TABS Oral Take 25 mg by mouth daily.      Marland Kitchen CIPROFLOXACIN HCL 500 MG PO TABS Oral  Take 500 mg by mouth 2 (two) times daily.      Marland Kitchen DICYCLOMINE HCL 20 MG PO TABS Oral Take 1 tablet (20 mg total) by mouth every 6 (six) hours. 15 tablet 0  . LOPERAMIDE HCL 2 MG PO CAPS Oral Take 4 mg by mouth 3 (three) times daily.      . OXYCODONE-ACETAMINOPHEN 5-325 MG PO TABS  1 or 2 tabs PO q6h prn pain 20 tablet 0  . PRAMOXINE HCL 1 % RE OINT Rectal Place 1 application rectally every 2 (two) hours as needed. hemorrhoids      .  RANITIDINE HCL 300 MG PO TABS Oral Take 300 mg by mouth 2 (two) times daily.        BP 154/111  Pulse 72  Temp(Src) 97.7 F (36.5 C) (Oral)  Resp 20  SpO2 100%  Physical Exam  Constitutional: He is oriented to person, place, and time. He appears well-developed and well-nourished.  HENT:  Head: Normocephalic and atraumatic.  Eyes: Conjunctivae and EOM are normal. Pupils are equal, round, and reactive to light.  Neck: Trachea normal. Neck supple. No thyromegaly present.  Cardiovascular: Normal rate, regular rhythm, S1 normal, S2 normal and normal pulses.     No systolic murmur is present   No diastolic murmur is present  Pulses:      Radial pulses are 2+ on the right side, and 2+ on the left side.  Pulmonary/Chest: Effort normal and breath sounds normal. He has no wheezes. He has no rhonchi. He has no rales. He exhibits no tenderness.  Abdominal: Soft. Normal appearance and bowel sounds are normal. There is no CVA tenderness and negative Murphy's sign.       Mild diffuse abdominal tenderness. Bowel sounds present. No distention. No rebound or guarding or peritonitis. Multiple abdominal scars present  Musculoskeletal:       BLE:s Calves nontender, no cords or erythema, negative Homans sign  Neurological: He is alert and oriented to person, place, and time. He has normal strength. No cranial nerve deficit or sensory deficit. GCS eye subscore is 4. GCS verbal subscore is 5. GCS motor subscore is 6.  Skin: Skin is warm and dry. No rash noted. He is not diaphoretic.  Psychiatric: His speech is normal.       Cooperative and appropriate    ED Course  Procedures (including critical care time)  Results for orders placed during the hospital encounter of 05/01/11  CBC      Component Value Range   WBC 5.1  4.0 - 10.5 (K/uL)   RBC 4.47  4.22 - 5.81 (MIL/uL)   Hemoglobin 13.4  13.0 - 17.0 (g/dL)   HCT 78.2  95.6 - 21.3 (%)   MCV 90.2  78.0 - 100.0 (fL)   MCH 30.0  26.0 - 34.0 (pg)   MCHC  33.3  30.0 - 36.0 (g/dL)   RDW 08.6  57.8 - 46.9 (%)   Platelets 151  150 - 400 (K/uL)  DIFFERENTIAL      Component Value Range   Neutrophils Relative 61  43 - 77 (%)   Neutro Abs 3.1  1.7 - 7.7 (K/uL)   Lymphocytes Relative 29  12 - 46 (%)   Lymphs Abs 1.5  0.7 - 4.0 (K/uL)   Monocytes Relative 8  3 - 12 (%)   Monocytes Absolute 0.4  0.1 - 1.0 (K/uL)   Eosinophils Relative 2  0 - 5 (%)   Eosinophils Absolute 0.1  0.0 -  0.7 (K/uL)   Basophils Relative 1  0 - 1 (%)   Basophils Absolute 0.0  0.0 - 0.1 (K/uL)  LACTIC ACID, PLASMA      Component Value Range   Lactic Acid, Venous 1.6  0.5 - 2.2 (mmol/L)  LIPASE, BLOOD      Component Value Range   Lipase 24  11 - 59 (U/L)  URINALYSIS, ROUTINE W REFLEX MICROSCOPIC      Component Value Range   Color, Urine YELLOW  YELLOW    Appearance CLEAR  CLEAR    Specific Gravity, Urine 1.011  1.005 - 1.030    pH 5.0  5.0 - 8.0    Glucose, UA NEGATIVE  NEGATIVE (mg/dL)   Hgb urine dipstick NEGATIVE  NEGATIVE    Bilirubin Urine NEGATIVE  NEGATIVE    Ketones, ur NEGATIVE  NEGATIVE (mg/dL)   Protein, ur NEGATIVE  NEGATIVE (mg/dL)   Urobilinogen, UA 0.2  0.0 - 1.0 (mg/dL)   Nitrite NEGATIVE  NEGATIVE    Leukocytes, UA NEGATIVE  NEGATIVE   URINE CULTURE      Component Value Range   Specimen Description URINE, RANDOM     Special Requests NONE     Setup Time [id number]     Colony Count NO GROWTH     Culture NO GROWTH     Report Status 05/02/2011 FINAL    COMPREHENSIVE METABOLIC PANEL      Component Value Range   Sodium 138  135 - 145 (mEq/L)   Potassium 3.9  3.5 - 5.1 (mEq/L)   Chloride 104  96 - 112 (mEq/L)   CO2 23  19 - 32 (mEq/L)   Glucose, Bld 91  70 - 99 (mg/dL)   BUN 10  6 - 23 (mg/dL)   Creatinine, Ser 9.56  0.50 - 1.35 (mg/dL)   Calcium 21.3  8.4 - 10.5 (mg/dL)   Total Protein 7.4  6.0 - 8.3 (g/dL)   Albumin 3.9  3.5 - 5.2 (g/dL)   AST 23  0 - 37 (U/L)   ALT 15  0 - 53 (U/L)   Alkaline Phosphatase 73  39 - 117 (U/L)   Total  Bilirubin 0.5  0.3 - 1.2 (mg/dL)   GFR calc non Af Amer 78 (*) >90 (mL/min)   GFR calc Af Amer >90  >90 (mL/min)   Ct Abdomen Pelvis Wo Contrast  05/01/2011  *RADIOLOGY REPORT*  Clinical Data: Abdominal pain, history of ulcer colitis with colectomy  CT ABDOMEN AND PELVIS WITHOUT CONTRAST  Technique:  Multidetector CT imaging of the abdomen and pelvis was performed following the standard protocol without intravenous contrast.  Comparison: CT abdomen pelvis of 02/23/1999  Findings: The lung bases are clear.  There is mild cardiomegaly present.  The liver is unremarkable in the unenhanced state.  No calcified gallstones are seen.  The pancreas is normal in size and the pancreatic duct is not dilated.  The adrenal glands and spleen are unremarkable.  The stomach is moderately fluid distended with no abnormality noted.  On this unenhanced study, no renal calculi are seen and there is no evidence of hydronephrosis.  The abdominal aorta is normal in caliber.  No adenopathy is seen.  Multiple sutures are noted within the rectosigmoid colon from prior surgery for ulcerative colitis.  Much of the colon however has been resected.  The anastomosis of small bowel with rectosigmoid colon is unremarkable.  There is only minimal prominence of the mucosa of the remaining portion of the rectosigmoid  colon but no definite edema is seen and this area not well distended.  No abnormality of small bowel is seen.  A prior ostomy site is noted with scarring in the superficial soft tissues of the right lower quadrant.  The urinary bladder is unremarkable.  The distal ureters are normal in caliber.  The prostate is normal in size.  No fluid is seen within the pelvis.  No bony abnormality is seen.  IMPRESSION:  1.  The mucosa of the remaining portion of the rectosigmoid colon is minimally prominent but no definite edema is seen and no bowel obstruction is noted. 2.  Mild cardiomegaly.  Original Report Authenticated By: Juline Patch,  M.D.   Dg Abd Acute W/chest  05/01/2011  *RADIOLOGY REPORT*  Clinical Data: Rule out small bowel obstruction  ACUTE ABDOMEN SERIES (ABDOMEN 2 VIEW & CHEST 1 VIEW)  Comparison: 10/11/2007  Findings: Cardiomediastinal silhouette is stable.  No acute infiltrate or pleural effusion.  No pulmonary edema.  There is nonspecific nonobstructive bowel gas pattern.  No free abdominal air is noted.  Multiple sutures are again noted within midline pelvis.  No free abdominal air is noted.  IMPRESSION: No acute disease.  Nonspecific nonobstructive bowel gas pattern. No free abdominal air.  Original Report Authenticated By: Natasha Mead, M.D.       MDM   Clinical presentation consistent with flare of ulcerative colitis and chronic recurrent symptoms. Old records reviewed as above. I do not feel that there is indication for repeat of CT scan or blood work at this time and patient concurs.  IM Dilaudid PO prednisone provided. No tachycardia or clinical dehydration. No fever or symptoms to suggest abscess or infectious etiology for pain. Patient is followed by GI at [facility] and agrees to close followup. Still for discharge home at this time with prednisone taper to. He agrees to continue sitz baths at home and declines any suppositories or Asacol.         Sunnie Nielsen, MD 05/09/11 [phone]

## 2011-05-14 ENCOUNTER — Emergency Department (HOSPITAL_COMMUNITY)
Admission: EM | Admit: 2011-05-14 | Discharge: 2011-05-14 | Disposition: A | Discharge status: Home / Self Care | Payer: Self-pay | Attending: Emergency Medicine | Admitting: Emergency Medicine

## 2011-05-14 ENCOUNTER — Encounter (HOSPITAL_COMMUNITY): Payer: Self-pay

## 2011-05-14 DIAGNOSIS — R197 Diarrhea, unspecified: Secondary | ICD-10-CM | POA: Insufficient documentation

## 2011-05-14 DIAGNOSIS — Z79899 Other long term (current) drug therapy: Secondary | ICD-10-CM | POA: Insufficient documentation

## 2011-05-14 DIAGNOSIS — R109 Unspecified abdominal pain: Secondary | ICD-10-CM | POA: Insufficient documentation

## 2011-05-14 DIAGNOSIS — E119 Type 2 diabetes mellitus without complications: Secondary | ICD-10-CM | POA: Insufficient documentation

## 2011-05-14 DIAGNOSIS — E785 Hyperlipidemia, unspecified: Secondary | ICD-10-CM | POA: Insufficient documentation

## 2011-05-14 DIAGNOSIS — K219 Gastro-esophageal reflux disease without esophagitis: Secondary | ICD-10-CM | POA: Insufficient documentation

## 2011-05-14 DIAGNOSIS — I10 Essential (primary) hypertension: Secondary | ICD-10-CM | POA: Insufficient documentation

## 2011-05-14 LAB — URINALYSIS, ROUTINE W REFLEX MICROSCOPIC
Ketones, ur: NEGATIVE mg/dL
Leukocytes, UA: NEGATIVE
Nitrite: NEGATIVE
Protein, ur: NEGATIVE mg/dL
pH: 5.5 (ref 5.0–8.0)

## 2011-05-14 LAB — COMPREHENSIVE METABOLIC PANEL
Alkaline Phosphatase: 73 U/L (ref 39–117)
BUN: 14 mg/dL (ref 6–23)
Chloride: 103 mEq/L (ref 96–112)
GFR calc Af Amer: 86 mL/min — ABNORMAL LOW (ref >90)
GFR calc non Af Amer: 74 mL/min — ABNORMAL LOW (ref >90)
Glucose, Bld: 93 mg/dL (ref 70–99)
Potassium: 3.7 mEq/L (ref 3.5–5.1)
Total Bilirubin: 0.9 mg/dL (ref 0.3–1.2)

## 2011-05-14 LAB — DIFFERENTIAL
Lymphs Abs: 1.6 10*3/uL (ref 0.7–4.0)
Monocytes Relative: 8 % (ref 3–12)
Neutro Abs: 6.4 10*3/uL (ref 1.7–7.7)
Neutrophils Relative %: 74 % (ref 43–77)

## 2011-05-14 LAB — CBC
Hemoglobin: 14.3 g/dL (ref 13.0–17.0)
MCH: 30.2 pg (ref 26.0–34.0)
RBC: 4.73 MIL/uL (ref 4.22–5.81)

## 2011-05-14 LAB — LIPASE, BLOOD: Lipase: 22 U/L (ref 11–59)

## 2011-05-14 MED ORDER — OXYCODONE-ACETAMINOPHEN 5-325 MG PO TABS: 2.0000 | ORAL_TABLET | Freq: Four times a day (QID) | ORAL | Status: AC | PRN

## 2011-05-14 MED ORDER — METRONIDAZOLE 500 MG PO TABS: 500.0000 mg | ORAL_TABLET | Freq: Three times a day (TID) | ORAL | Status: AC

## 2011-05-14 MED ORDER — OXYCODONE-ACETAMINOPHEN 5-325 MG PO TABS
2.0000 | ORAL_TABLET | Freq: Once | ORAL | Status: AC
  Administered 2011-05-14: 2 via ORAL
  Filled 2011-05-14: qty 2

## 2011-05-14 MED ORDER — HYDROMORPHONE HCL PF 1 MG/ML IJ SOLN
1.0000 mg | Freq: Once | INTRAMUSCULAR | Status: AC
  Administered 2011-05-14: 1 mg via INTRAVENOUS

## 2011-05-14 MED ORDER — METRONIDAZOLE 500 MG PO TABS
500.0000 mg | ORAL_TABLET | Freq: Once | ORAL | Status: AC
Start: 2011-05-14 — End: 2011-05-14
  Administered 2011-05-14: 500 mg via ORAL
  Filled 2011-05-14: qty 1

## 2011-05-14 MED ORDER — SODIUM CHLORIDE 0.9 % IV BOLUS (SEPSIS)
1000.0000 mL | Freq: Once | INTRAVENOUS | Status: AC
  Administered 2011-05-14: 1000 mL via INTRAVENOUS

## 2011-05-14 MED ORDER — DICYCLOMINE HCL 10 MG PO CAPS
20.0000 mg | ORAL_CAPSULE | ORAL | Status: AC
  Administered 2011-05-14: 20 mg via ORAL
  Filled 2011-05-14 (×2): qty 2

## 2011-05-14 MED ORDER — HYDROMORPHONE HCL PF 1 MG/ML IJ SOLN
INTRAMUSCULAR | Status: AC
  Filled 2011-05-14: qty 1

## 2011-05-14 MED ORDER — HYDROMORPHONE HCL PF 1 MG/ML IJ SOLN
1.0000 mg | Freq: Once | INTRAMUSCULAR | Status: AC
  Administered 2011-05-14: 1 mg via INTRAVENOUS
  Filled 2011-05-14: qty 1

## 2011-05-14 MED ORDER — ONDANSETRON HCL 4 MG/2ML IJ SOLN
4.0000 mg | Freq: Once | INTRAMUSCULAR | Status: AC
  Administered 2011-05-14: 4 mg via INTRAVENOUS
  Filled 2011-05-14: qty 2

## 2011-05-14 MED ORDER — DICYCLOMINE HCL 20 MG PO TABS: 20.0000 mg | ORAL_TABLET | Freq: Four times a day (QID) | ORAL | Status: DC | PRN

## 2011-05-14 NOTE — ED Notes (Signed)
Pt does not have iv in L hand at this time pt does not have abd incision.

## 2011-05-14 NOTE — ED Notes (Signed)
Pt aware of need of urine sample. Pt has urinal

## 2011-05-14 NOTE — ED Notes (Signed)
Patient reports that this is his 3rd visit in 2 weeks for this ulcerative colitis. Did get some relief with steroids but now having persistent diarrhea

## 2011-05-15 NOTE — ED Provider Notes (Signed)
History     CSN: [id number] Arrival date & time: 05/14/2011  3:15 PM   First MD Initiated Contact with Patient 05/14/11 1519      Chief Complaint  Patient presents with  . Abdominal Pain    (Consider location/radiation/quality/duration/timing/severity/associated sxs/prior treatment) HPI Patient is a 50 yo M with history of ulcerative colitis who complains of 10/10 abdominal pain as well as diarrhea, bloody stools and nausea for the past 2 weeks.  This is the patient's 3rd ED visit over that time period.  He has not seen a GI doctor though this was discussed at his last visit 2 days ago.  Since that time patient admits some improvement of his symptoms with prednisone use.  He denies fevers, chest pain, shortness of breath, or lightheadedness.  Bowel movements worsen his pain and nothing really seems to help. There are no other associated or modifying factors. Patient was last imaged 2 weeks ago and actually feels better than he did at that time. Past Medical History  Diagnosis Date  . Hypertension   . Inguinal hernia   . Hyperlipidemia   . Ulcer     ulcerative colitis  . Diabetes mellitus   . GERD (gastroesophageal reflux disease)   . Arthritis   . Ulcerative colitis     Past Surgical History  Procedure Date  . Colectomy   . Ileoanal anastomosis   . Appendectomy     Family History  Problem Relation Age of Onset  . Cancer Sister     History  Substance Use Topics  . Smoking status: Never Smoker   . Smokeless tobacco: Never Used  . Alcohol Use: 2.4 oz/week    4 Cans of beer per week      Review of Systems  Constitutional: Negative.   HENT: Negative.   Eyes: Negative.   Respiratory: Negative.   Cardiovascular: Negative.   Gastrointestinal: Positive for nausea, abdominal pain, diarrhea, blood in stool and rectal pain.  Genitourinary: Negative.   Musculoskeletal: Negative.   Skin: Negative.   Neurological: Negative.   Hematological: Negative.     Psychiatric/Behavioral: Negative.   All other systems reviewed and are negative.    Allergies  Asacol; Penicillins; Sulfa antibiotics; and Iodinated diagnostic agents  Home Medications   Current Outpatient Rx  Name Route Sig Dispense Refill  . ACETAMINOPHEN 500 MG PO TABS Oral Take 1,000 mg by mouth every 6 (six) hours as needed. Pain      . ATENOLOL 25 MG PO TABS Oral Take 25 mg by mouth daily.      Marland Kitchen CIPROFLOXACIN HCL 500 MG PO TABS Oral Take 500 mg by mouth 2 (two) times daily. Patient takes this on a regular basis has been on it for years uses it as an anit inflammatory    . DICYCLOMINE HCL 20 MG PO TABS Oral Take 1 tablet (20 mg total) by mouth every 6 (six) hours. 15 tablet 0  . LOPERAMIDE HCL 2 MG PO CAPS Oral Take 4 mg by mouth 3 (three) times daily.      Marland Kitchen PRAMOXINE HCL 1 % RE OINT Rectal Place 1 application rectally every 2 (two) hours as needed. hemorrhoids      . PREDNISONE 20 MG PO TABS  Day 1 through 4 take 3 tablets daily, day 5 through 8 take 2 tablets daily, day 9 through 12 take 1 tablet daily, day 13-16 take half tablet daily 28 tablet 0  . RANITIDINE HCL 300 MG PO TABS Oral Take 300  mg by mouth 2 (two) times daily.      Marland Kitchen DICYCLOMINE HCL 20 MG PO TABS Oral Take 1 tablet (20 mg total) by mouth 4 (four) times daily as needed. 28 tablet 0  . METRONIDAZOLE 500 MG PO TABS Oral Take 1 tablet (500 mg total) by mouth 3 (three) times daily. 30 tablet 0  . OXYCODONE-ACETAMINOPHEN 5-325 MG PO TABS Oral Take 2 tablets by mouth every 6 (six) hours as needed for pain. 30 tablet 0    BP 138/75  Pulse 77  Temp(Src) 98.3 F (36.8 C) (Oral)  Resp 18  SpO2 98%  Physical Exam  Nursing note and vitals reviewed. Constitutional: He is oriented to person, place, and time. He appears well-developed and well-nourished. No distress.  HENT:  Head: Normocephalic and atraumatic.  Eyes: Conjunctivae and EOM are normal. Pupils are equal, round, and reactive to light.  Neck: Normal range  of motion.  Cardiovascular: Normal rate, regular rhythm, normal heart sounds and intact distal pulses.  Exam reveals no gallop and no friction rub.   No murmur heard. Pulmonary/Chest: Effort normal. No respiratory distress. He has no wheezes. He has no rales.  Abdominal: Soft. Bowel sounds are normal. He exhibits no distension. There is generalized tenderness. There is no rebound and no guarding.  Genitourinary:       Rectum appeared normal with skin irritation surrounding this from frequent stool. No evidence of fistula or hemorrhoids  Musculoskeletal: Normal range of motion. He exhibits no edema and no tenderness.  Neurological: He is alert and oriented to person, place, and time. No cranial nerve deficit. He exhibits normal muscle tone. Coordination normal.  Skin: Skin is warm and dry. No rash noted.  Psychiatric: He has a normal mood and affect.    ED Course  Procedures (including critical care time)  Labs Reviewed  COMPREHENSIVE METABOLIC PANEL - Abnormal; Notable for the following:    GFR calc non Af Amer 74 (*)    GFR calc Af Amer 86 (*)    All other components within normal limits  CBC  DIFFERENTIAL  LIPASE, BLOOD  URINALYSIS, ROUTINE W REFLEX MICROSCOPIC   No results found.   1. Abdominal pain   2. Diarrhea       MDM  Patient was examined by myself.  He had abdominal tenderness that was diffuse without an acute abdomen.  CBC, CMP, lipase, UA were all normal.  Patient was given IVF and pain medication.  Patient had improvement in his symptoms.  I spoke with Dr. Russella Dar from [facility] where patient was last followed a few years ago by Dr. Arlyce Dice.  Patient had requested Flagyl prescription as this normally helps him.  Dr. Russella Dar and I discussed this.  He stated that the patient could follow-up in clinic with Dr. Arlyce Dice.  Though flagyl is normally given in Crohn's rather than [place] Dr. Russella Dar had no problem with the patient receiving this.  Patient was given a brief course of  percocet as well.  He was instructed to follow-up with GI.  Patient was able to tolerate po meds and was discharged home in good condition with flagyl and percocet prescriptions.        Cyndra Numbers, MD 05/15/11 [phone]

## 2011-05-16 ENCOUNTER — Telehealth: Payer: Self-pay | Admitting: Gastroenterology

## 2011-05-16 NOTE — Telephone Encounter (Signed)
Pt scheduled to see Amy Esterwood PA 05/20/11@1 :30pm. Left message for pt to call back.

## 2011-05-16 NOTE — Telephone Encounter (Signed)
Spoke with pt and offered him an appt for today with Dr. Deatra Ina but pt states he does not have transportation. Would like an appt with [facility] when available.

## 2011-05-19 NOTE — Telephone Encounter (Signed)
Spoke with pt and he is aware of appt date and time.

## 2011-05-20 ENCOUNTER — Ambulatory Visit: Payer: Self-pay | Admitting: Physician Assistant

## 2011-07-22 ENCOUNTER — Encounter (HOSPITAL_COMMUNITY): Payer: Self-pay | Admitting: Family Medicine

## 2011-07-22 ENCOUNTER — Emergency Department (HOSPITAL_COMMUNITY): Payer: Self-pay

## 2011-07-22 ENCOUNTER — Emergency Department (HOSPITAL_COMMUNITY)
Admission: EM | Admit: 2011-07-22 | Discharge: 2011-07-22 | Disposition: A | Discharge status: Home / Self Care | Payer: Self-pay | Attending: Emergency Medicine | Admitting: Emergency Medicine

## 2011-07-22 DIAGNOSIS — R110 Nausea: Secondary | ICD-10-CM | POA: Insufficient documentation

## 2011-07-22 DIAGNOSIS — L905 Scar conditions and fibrosis of skin: Secondary | ICD-10-CM | POA: Insufficient documentation

## 2011-07-22 DIAGNOSIS — Z9889 Other specified postprocedural states: Secondary | ICD-10-CM | POA: Insufficient documentation

## 2011-07-22 DIAGNOSIS — E785 Hyperlipidemia, unspecified: Secondary | ICD-10-CM | POA: Insufficient documentation

## 2011-07-22 DIAGNOSIS — I10 Essential (primary) hypertension: Secondary | ICD-10-CM | POA: Insufficient documentation

## 2011-07-22 DIAGNOSIS — Z79899 Other long term (current) drug therapy: Secondary | ICD-10-CM | POA: Insufficient documentation

## 2011-07-22 DIAGNOSIS — E119 Type 2 diabetes mellitus without complications: Secondary | ICD-10-CM | POA: Insufficient documentation

## 2011-07-22 DIAGNOSIS — K219 Gastro-esophageal reflux disease without esophagitis: Secondary | ICD-10-CM | POA: Insufficient documentation

## 2011-07-22 DIAGNOSIS — K5190 Ulcerative colitis, unspecified, without complications: Secondary | ICD-10-CM | POA: Insufficient documentation

## 2011-07-22 DIAGNOSIS — R197 Diarrhea, unspecified: Secondary | ICD-10-CM | POA: Insufficient documentation

## 2011-07-22 DIAGNOSIS — R109 Unspecified abdominal pain: Secondary | ICD-10-CM | POA: Insufficient documentation

## 2011-07-22 LAB — CBC
Hemoglobin: 13.5 g/dL (ref 13.0–17.0)
MCHC: 33.5 g/dL (ref 30.0–36.0)
RDW: 14.1 % (ref 11.5–15.5)
WBC: 4.7 10*3/uL (ref 4.0–10.5)

## 2011-07-22 LAB — COMPREHENSIVE METABOLIC PANEL
ALT: 15 U/L (ref 0–53)
AST: 22 U/L (ref 0–37)
Albumin: 3.8 g/dL (ref 3.5–5.2)
Alkaline Phosphatase: 75 U/L (ref 39–117)
Potassium: 3.6 mEq/L (ref 3.5–5.1)
Sodium: 139 mEq/L (ref 135–145)
Total Protein: 7.8 g/dL (ref 6.0–8.3)

## 2011-07-22 MED ORDER — ONDANSETRON HCL 4 MG/2ML IJ SOLN
4.0000 mg | Freq: Once | INTRAMUSCULAR | Status: AC
  Administered 2011-07-22: 4 mg via INTRAVENOUS
  Filled 2011-07-22: qty 2

## 2011-07-22 MED ORDER — SODIUM CHLORIDE 0.9 % IV BOLUS (SEPSIS)
1000.0000 mL | Freq: Once | INTRAVENOUS | Status: AC
  Administered 2011-07-22: 1000 mL via INTRAVENOUS

## 2011-07-22 MED ORDER — HYDROMORPHONE HCL PF 2 MG/ML IJ SOLN
2.0000 mg | Freq: Once | INTRAMUSCULAR | Status: AC
  Administered 2011-07-22: 2 mg via INTRAVENOUS
  Filled 2011-07-22: qty 1

## 2011-07-22 MED ORDER — OXYCODONE-ACETAMINOPHEN 5-325 MG PO TABS: 2.0000 | ORAL_TABLET | ORAL | Status: AC | PRN

## 2011-07-22 MED ORDER — PREDNISONE 10 MG PO TABS: 40.0000 mg | ORAL_TABLET | Freq: Every day | ORAL | Status: DC

## 2011-07-22 MED ORDER — MORPHINE SULFATE 4 MG/ML IJ SOLN
6.0000 mg | Freq: Once | INTRAMUSCULAR | Status: DC
  Filled 2011-07-22: qty 1

## 2011-07-22 MED ORDER — HYDROMORPHONE HCL PF 2 MG/ML IJ SOLN
INTRAMUSCULAR | Status: AC
  Administered 2011-07-22: 2 mg
  Filled 2011-07-22: qty 1

## 2011-07-22 MED ORDER — ONDANSETRON HCL 4 MG PO TABS: 8.0000 mg | ORAL_TABLET | Freq: Three times a day (TID) | ORAL | Status: AC | PRN

## 2011-07-22 MED ORDER — SODIUM CHLORIDE 0.9 % IV SOLN
Freq: Once | INTRAVENOUS | Status: AC
  Administered 2011-07-22: 09:00:00 via INTRAVENOUS

## 2011-07-22 NOTE — ED Notes (Signed)
Patient transported to CT 

## 2011-07-22 NOTE — ED Provider Notes (Signed)
History     CSN: [id number]  Arrival date & time 07/22/11  0730   First MD Initiated Contact with Patient 07/22/11 [phone]      Chief Complaint  Patient presents with  . Abdominal Pain    (Consider location/radiation/quality/duration/timing/severity/associated sxs/prior treatment) Patient is a 51 y.o. male presenting with abdominal pain. The history is provided by the patient.  Abdominal Pain The primary symptoms of the illness include abdominal pain.   the patient reports a long-standing history of ulcerative colitis status post hemicolectomy [place] several years ago.  He reports that time he had a J-pouch and developed chronic pouchitis and is on ciprofloxacin daily and has been for several years.  He currently is without a gastroenterologist.  He reports worsening abdominal cramping intermittent nausea and persistent diarrhea for approximately 2-3 weeks.  He reports now he is feeling generally weak and feels as though he may be dehydrated.  He reports this feels similar to prior flares of ulcerative colitis.  He denies fever or chills.  He does report increasing pain in his right lower quadrant.  He no longer sees his doctor in [place].  He called her gastroenterologist several months ago in [place] however that Dr. is not taking new patients.  Nothing worsens the symptoms.  Nothing improves his symptoms.  Symptoms are constant.  Past Medical History  Diagnosis Date  . Hypertension   . Inguinal hernia   . Hyperlipidemia   . Ulcer     ulcerative colitis  . Diabetes mellitus   . GERD (gastroesophageal reflux disease)   . Arthritis   . Ulcerative colitis     Past Surgical History  Procedure Date  . Colectomy   . Ileoanal anastomosis   . Appendectomy     Family History  Problem Relation Age of Onset  . Cancer Sister     History  Substance Use Topics  . Smoking status: Never Smoker   . Smokeless tobacco: Never Used  . Alcohol Use: 2.4 oz/week    4 Cans of beer  per week      Review of Systems  Gastrointestinal: Positive for abdominal pain.  All other systems reviewed and are negative.    Allergies  Asacol; Penicillins; Sulfa antibiotics; and Iodinated diagnostic agents  Home Medications   Current Outpatient Rx  Name Route Sig Dispense Refill  . ACETAMINOPHEN 500 MG PO TABS Oral Take 1,000 mg by mouth every 6 (six) hours as needed. Pain      . ATENOLOL 25 MG PO TABS Oral Take 25 mg by mouth daily.      Marland Kitchen CIPROFLOXACIN HCL 500 MG PO TABS Oral Take 500 mg by mouth 2 (two) times daily. Patient takes this on a regular basis has been on it for years uses it as an anit inflammatory    . DICYCLOMINE HCL 20 MG PO TABS Oral Take 1 tablet (20 mg total) by mouth every 6 (six) hours. 15 tablet 0  . DICYCLOMINE HCL 20 MG PO TABS Oral Take 1 tablet (20 mg total) by mouth 4 (four) times daily as needed. 28 tablet 0  . LOPERAMIDE HCL 2 MG PO CAPS Oral Take 4 mg by mouth 3 (three) times daily.      Marland Kitchen PRAMOXINE HCL 1 % RE OINT Rectal Place 1 application rectally every 2 (two) hours as needed. hemorrhoids      . RANITIDINE HCL 300 MG PO TABS Oral Take 300 mg by mouth 2 (two) times daily.  BP 137/98  Pulse 75  Temp(Src) 97.9 F (36.6 C) (Oral)  Resp 18  Ht 5' 11"  (1.803 m)  SpO2 100%  Physical Exam  Nursing note and vitals reviewed. Constitutional: He is oriented to person, place, and time. He appears well-developed and well-nourished.  HENT:  Head: Normocephalic and atraumatic.  Eyes: EOM are normal.  Neck: Normal range of motion.  Cardiovascular: Normal rate, regular rhythm, normal heart sounds and intact distal pulses.   Pulmonary/Chest: Effort normal and breath sounds normal. No respiratory distress.  Abdominal: Soft. He exhibits no distension. There is no tenderness.       Midline abdominal scar consistent with prior hemicolectomy.  No erythema or drainage from prior surgical scars.  No abdominal erythema.  Patient is tender in his  right lower quadrant without guarding or rebound  Musculoskeletal: Normal range of motion.  Neurological: He is alert and oriented to person, place, and time.  Skin: Skin is warm and dry.  Psychiatric: He has a normal mood and affect. Judgment normal.    ED Course  Procedures (including critical care time)  Labs Reviewed  COMPREHENSIVE METABOLIC PANEL - Abnormal; Notable for the following:    GFR calc non Af Amer 68 (*)    GFR calc Af Amer 79 (*)    All other components within normal limits  CBC  LIPASE, BLOOD   Ct Abdomen Pelvis Wo Contrast  07/22/2011  *RADIOLOGY REPORT*  Clinical Data: Diffuse abdominal pain  CT ABDOMEN AND PELVIS WITHOUT CONTRAST  Technique:  Multidetector CT imaging of the abdomen and pelvis was performed following the standard protocol without intravenous contrast.  Comparison: 05/01/2011  Findings: Liver, gallbladder, spleen, pancreas, adrenal glands, kidneys are within normal limits.  Status post sub total colectomy is again noted.  Small bowel anastomosis to the rectosigmoid is stable in appearance without evidence of focal recurrence.  There is very minimal wall thickening surrounding the rectum which is unchanged and therefore a chronic finding.  No obvious inflammatory changes adjacent to the rectum or sigmoid which remains.  Stranding in the right lower quadrant involving the sub tendinous fat and musculature is stable.  No free fluid.  Left inguinal hernia contains only adipose tissue.  IMPRESSION: Stable appearance of the abdomen pelvis with chronic and postoperative changes.  Original Report Authenticated By: Jamas Lav, M.D.    I personally reviewed his CT scan   1. Abdominal pain   2. Ulcerative colitis       MDM  The patient feels much better at this time.  This may represent a small flare of his ulcerative colitis.  He will be given gastroenterology followup.  He was discharged home with antibiotics pain medicine and a short course of  prednisone.        Hoy Morn, MD 07/22/11 1150

## 2011-07-22 NOTE — ED Notes (Signed)
Pt reports having severe abdominal pain with nausea and diarrhea x1 week. Reports hx of ulcerative colitis, states meds aren't working.

## 2011-07-28 ENCOUNTER — Encounter: Payer: Self-pay | Admitting: Gastroenterology

## 2011-08-02 ENCOUNTER — Emergency Department (HOSPITAL_COMMUNITY)
Admission: EM | Admit: 2011-08-02 | Discharge: 2011-08-02 | Disposition: A | Discharge status: Home / Self Care | Payer: Self-pay | Attending: Emergency Medicine | Admitting: Emergency Medicine

## 2011-08-02 ENCOUNTER — Encounter (HOSPITAL_COMMUNITY): Payer: Self-pay | Admitting: *Deleted

## 2011-08-02 DIAGNOSIS — R109 Unspecified abdominal pain: Secondary | ICD-10-CM | POA: Insufficient documentation

## 2011-08-02 DIAGNOSIS — K5190 Ulcerative colitis, unspecified, without complications: Secondary | ICD-10-CM | POA: Insufficient documentation

## 2011-08-02 DIAGNOSIS — R10819 Abdominal tenderness, unspecified site: Secondary | ICD-10-CM | POA: Insufficient documentation

## 2011-08-02 DIAGNOSIS — Z79899 Other long term (current) drug therapy: Secondary | ICD-10-CM | POA: Insufficient documentation

## 2011-08-02 LAB — CBC
HCT: 46.5 % (ref 39.0–52.0)
Hemoglobin: 16.3 g/dL (ref 13.0–17.0)
MCH: 28.6 pg (ref 26.0–34.0)
MCHC: 35.1 g/dL (ref 30.0–36.0)
MCV: 81.7 fL (ref 78.0–100.0)

## 2011-08-02 LAB — DIFFERENTIAL
Basophils Relative: 1 % (ref 0–1)
Eosinophils Absolute: 0 10*3/uL (ref 0.0–0.7)
Monocytes Absolute: 0.4 10*3/uL (ref 0.1–1.0)
Monocytes Relative: 7 % (ref 3–12)
Neutro Abs: 4.1 10*3/uL (ref 1.7–7.7)

## 2011-08-02 LAB — BASIC METABOLIC PANEL
BUN: 10 mg/dL (ref 6–23)
Chloride: 103 mEq/L (ref 96–112)
Creatinine, Ser: 1.09 mg/dL (ref 0.50–1.35)
GFR calc Af Amer: >90 mL/min (ref >90)

## 2011-08-02 MED ORDER — METHYLPREDNISOLONE SODIUM SUCC 125 MG IJ SOLR
125.0000 mg | Freq: Once | INTRAMUSCULAR | Status: AC
  Administered 2011-08-02: 125 mg via INTRAVENOUS
  Filled 2011-08-02: qty 2

## 2011-08-02 MED ORDER — PERCOCET 5-325 MG PO TABS: 1.0000 | ORAL_TABLET | ORAL | Status: AC | PRN

## 2011-08-02 MED ORDER — HYDROMORPHONE HCL PF 1 MG/ML IJ SOLN
1.0000 mg | Freq: Once | INTRAMUSCULAR | Status: AC
  Administered 2011-08-02: 1 mg via INTRAVENOUS
  Filled 2011-08-02: qty 1

## 2011-08-02 MED ORDER — PREDNISONE 10 MG PO TABS: 10.0000 mg | ORAL_TABLET | Freq: Every day | ORAL | Status: DC

## 2011-08-02 MED ORDER — ONDANSETRON HCL 4 MG/2ML IJ SOLN
4.0000 mg | Freq: Once | INTRAMUSCULAR | Status: AC
  Administered 2011-08-02: 4 mg via INTRAVENOUS
  Filled 2011-08-02: qty 2

## 2011-08-02 MED ORDER — PROMETHAZINE HCL 25 MG PO TABS: 25.0000 mg | ORAL_TABLET | Freq: Three times a day (TID) | ORAL | Status: DC | PRN

## 2011-08-02 MED ORDER — SODIUM CHLORIDE 0.9 % IV BOLUS (SEPSIS)
1000.0000 mL | Freq: Once | INTRAVENOUS | Status: AC
  Administered 2011-08-02: 1000 mL via INTRAVENOUS

## 2011-08-02 NOTE — ED Provider Notes (Signed)
History     CSN: [id number]  Arrival date & time 08/02/11  1324   First MD Initiated Contact with Patient 08/02/11 1421      Chief Complaint  Patient presents with  . Abdominal Pain    (Consider location/radiation/quality/duration/timing/severity/associated sxs/prior treatment) HPI Comments: Patient with ulcerative colitis s/p colectomy reports increased abdominal pain and diarrhea, associated nausea since February 11.  Is having 20-25 bowel movements/day, all liquid.  Has had decreased urinary output, only urinating once a day.  Oral intake has decreased over the past 2 days due to pain and nausea. Temperature has been 99.7 at highest, has stayed between 99.1-99.7. Patient was seen in ED Feb 5 and d/c home with prednisone and pain medication.  States this this helped a little bit, but the prednisone dose was lower than what he usually takes and it usually helps when he gets steroids by IV while in the ED.  States this feels like prior UC flare.    The history is provided by the patient.    Past Medical History  Diagnosis Date  . Hypertension   . Inguinal hernia   . Hyperlipidemia   . Ulcer     ulcerative colitis  . Diabetes mellitus   . GERD (gastroesophageal reflux disease)   . Arthritis   . Ulcerative colitis     Past Surgical History  Procedure Date  . Colectomy   . Ileoanal anastomosis   . Appendectomy     Family History  Problem Relation Age of Onset  . Cancer Sister     History  Substance Use Topics  . Smoking status: Never Smoker   . Smokeless tobacco: Never Used  . Alcohol Use: 2.4 oz/week    4 Cans of beer per week      Review of Systems  Constitutional: Negative for chills.  Gastrointestinal: Negative for vomiting.  All other systems reviewed and are negative.    Allergies  Asacol; Penicillins; Sulfa antibiotics; and Iodinated diagnostic agents  Home Medications   Current Outpatient Rx  Name Route Sig Dispense Refill  . ACETAMINOPHEN 500  MG PO TABS Oral Take 1,000 mg by mouth every 6 (six) hours as needed. Pain      . ATENOLOL 25 MG PO TABS Oral Take 25 mg by mouth daily.      Marland Kitchen LANSOPRAZOLE 30 MG PO CPDR Oral Take 30 mg by mouth daily.    Marland Kitchen LOPERAMIDE HCL 2 MG PO CAPS Oral Take 4 mg by mouth 3 (three) times daily.      Marland Kitchen PRAMOXINE HCL 1 % RE OINT Rectal Place 1 application rectally every 2 (two) hours as needed. hemorrhoids     . RANITIDINE HCL 300 MG PO TABS Oral Take 300 mg by mouth 2 (two) times daily.      Marland Kitchen CIPROFLOXACIN HCL 500 MG PO TABS Oral Take 500 mg by mouth 2 (two) times daily. Patient takes this on a regular basis has been on it for years uses it as an anit inflammatory    . OXYCODONE-ACETAMINOPHEN 5-325 MG PO TABS Oral Take 2 tablets by mouth every 4 (four) hours as needed for pain. 15 tablet 0    BP 129/97  Pulse 96  Temp(Src) 98.5 F (36.9 C) (Oral)  Resp 20  SpO2 100%  Physical Exam  Nursing note and vitals reviewed. Constitutional: He is oriented to person, place, and time. He appears well-developed and well-nourished.  HENT:  Head: Normocephalic and atraumatic.  Neck: Neck supple.  Cardiovascular: Normal rate, regular rhythm and normal heart sounds.   Pulmonary/Chest: Breath sounds normal. No respiratory distress. He has no wheezes. He has no rales. He exhibits no tenderness.  Abdominal: Soft. Bowel sounds are normal. He exhibits no distension. There is tenderness in the right lower quadrant, suprapubic area and left lower quadrant. There is no rigidity, no rebound and no guarding.       Moderate diffuse lower abdominal pain  Neurological: He is alert and oriented to person, place, and time.  Psychiatric: He has a normal mood and affect. His behavior is normal. Judgment and thought content normal.    ED Course  Procedures (including critical care time)  Labs Reviewed  BASIC METABOLIC PANEL - Abnormal; Notable for the following:    GFR calc non Af Amer 79 (*)    All other components within  normal limits  CBC  DIFFERENTIAL   No results found.  Patient reports he is feeling much better after solu-medrol and pain medication.  I have discussed patient with Dr Wyvonnia Dusky who agrees with workup and plan.  Patient agrees with d/c home.  He has GI follow up schedule with Dr Deatra Ina for later this month.  Patient states what usually works for him is a 2 week prednisone taper.    1. Ulcerative colitis       MDM  Patient with hx ulcerative colitis recently seen in ED p/w continued pain, increased watery stools and nausea.  Patient's abdominal exam is nonfocal, patient is afebrile, pt improved quickly with medications in ED.  Patient had CT scan on previous visit on 07/22/11 that showed no abnormalities.  I did not rescan the patient as presentation did not suggest any worsening of his course.  Patient states he does better with longer steroid taper and I have d/c him home with this as well as pain and nausea medications.  Patient has follow up scheduled with Dr Deatra Ina later this month.  Patient verbalizes understanding and agrees with plan.         [place], [place] 08/02/11 1950

## 2011-08-02 NOTE — Discharge Instructions (Signed)
Please follow up with Dr Deatra Ina as previously planned.  Return to the ER immediately if you develop fevers greater than 100.4, uncontrolled pain, inability to tolerate fluids by mouth.  You may return to the ER at any time for worsening condition or any new symptoms that concern you.    Ulcerative Colitis Ulcerative colitis is a long lasting swelling and soreness (inflammation) of the colon (large intestine). In patients with ulcerative colitis, sores (ulcers) and inflammation of the inner lining of the colon lead to illness. Ulcerative colitis can also cause problems outside the digestive tract.  Ulcerative colitis is closely related to another condition of inflammation of the intestines called Crohn's disease. Together, they are frequently referred to as inflammatory bowel disease (IBD). Ulcerative colitis and Crohn's diseases are conditions that can last years to decades. Men and women are affected equally. They most commonly begin during adolescence and early adulthood. SYMPTOMS  Common symptoms of ulcerative colitis include rectal bleeding and diarrhea. There is a wide range of symptoms among patients with this disease depending on how severe the disease is. Some of these symptoms are:  Abdominal pain or cramping.   Diarrhea.   Fever.   Tiredness (fatigue).   Weight loss.   Night sweats.   Rectal pain.   Feeling the immediate need to have a bowel movement (rectal urgency).  CAUSES  Ulcerative colitis is caused by increased activity of the immune system in the intestines. The immune system is the system that protects the body against disease such as harmful bacteria, viruses, fungi, and other foreign invaders. When the immune system overacts, it causes inflammation. The cause of the increased immune system activity is not known. This over activity causes long-lasting inflammation and ulceration. This condition may be passed down from your parents (inherited). Brothers, sisters, children,  and parents of patients with IBD are more likely to develop these diseases. It is not contagious. This means you cannot catch it from someone else. DIAGNOSIS  Your caregiver may suspect ulcerative colitis based on your symptoms and exam. Blood tests may confirm that there is a problem. You may be asked to submit a stool specimen for examination. X-rays and CT scans may be necessary. Ultimately, the diagnosis is usually made after a flexible tube is inserted via your anus and your colon is examined under sedation (colonoscopy). With this test, the specialist can take a tiny tissue sample from inside the bowel (biopsy). Examination of this biopsy tissue under a microscopy can reveal ulcerative colitis as the cause of your symptoms. TREATMENT   There is no cure for ulcerative colitis.   Complications such as massive bleeding from the colon (hemorrhage), development of a hole in the colon (perforation), or the development of precancerous or cancerous changes of the colon may require surgery.   Medications are often used to decrease inflammation and control the immune system. These include medicines related to aspirin, steroid medications, and newer and stronger medications to slow down the immune system. Some medications may be used as suppositories or enemas. A number of other medications are used or have been studied. Your caregiver will make specific recommendations.  HOME CARE INSTRUCTIONS   There is no cure for ulcerative colitis disease. The best treatment is frequent checkups with your caregiver. Periodic reevaluation is important.   Symptoms such as diarrhea can be controlled with medications. Avoid foods that have a laxative effect such fresh fruit and vegetables and dairy products. During flare ups, you can rest your bowel by staying  away from solid foods. Drink clear liquids frequently during the day. Electrolyte or rehydrating fluids are best. Your caregiver can help you with suggestions. Drink  often to prevent dehydration. When diarrhea has cleared, eat smaller meals and more often. Avoid food additives and stimulants such as caffeine (coffee, tea, many sodas, or chocolate). Avoid dairy products. Enzyme supplements may help if you develop intolerance to a sugar in dairy products (lactose). Ask your caregiver or dietitian about specific dietary instructions.   If you had surgery, be sure you understand your care instructions thoroughly, including proper care of any surgical wounds.   Take any medications exactly as prescribed.   Try to maintain a positive attitude. Learn relaxation techniques such as self hypnosis, mental imaging, and muscle relaxation. If possible, avoid stresses that aggravate your condition. Exercise regularly. Follow your diet. Always get plenty of rest.  SEEK MEDICAL CARE IF:   Your symptoms fail to improve after a week or two of new treatment.   You experience continued weight loss.   You have ongoing crampy digestion or loose bowels.   You develop a new skin rash, skin sores, or eye problems.  SEEK IMMEDIATE MEDICAL CARE IF:   You have worsening of your symptoms or develop new symptoms.   You have an oral temperature above 102 F (38.9 C), not controlled by medicine.   You develop bloody diarrhea.   You have severe abdominal pain.  Document Released: 03/12/2005 Document Revised: 02/12/2011 Document Reviewed: 02/09/2007 [facility] Patient Information 2012 [employer].

## 2011-08-02 NOTE — ED Provider Notes (Signed)
Medical screening examination/treatment/procedure(s) were performed by non-physician practitioner and as supervising physician I was immediately available for consultation/collaboration.   Ezequiel Essex, MD 08/02/11 [phone]

## 2011-08-02 NOTE — ED Notes (Signed)
Pt in c/o abd pain with diarrhea and nausea, history of ulcerative colitis

## 2011-08-05 ENCOUNTER — Emergency Department (HOSPITAL_COMMUNITY)
Admission: EM | Admit: 2011-08-05 | Discharge: 2011-08-05 | Disposition: A | Discharge status: Home / Self Care | Payer: Self-pay | Attending: Emergency Medicine | Admitting: Emergency Medicine

## 2011-08-05 ENCOUNTER — Encounter (HOSPITAL_COMMUNITY): Payer: Self-pay | Admitting: *Deleted

## 2011-08-05 DIAGNOSIS — R110 Nausea: Secondary | ICD-10-CM | POA: Insufficient documentation

## 2011-08-05 DIAGNOSIS — Z79899 Other long term (current) drug therapy: Secondary | ICD-10-CM | POA: Insufficient documentation

## 2011-08-05 DIAGNOSIS — K625 Hemorrhage of anus and rectum: Secondary | ICD-10-CM | POA: Insufficient documentation

## 2011-08-05 DIAGNOSIS — M129 Arthropathy, unspecified: Secondary | ICD-10-CM | POA: Insufficient documentation

## 2011-08-05 DIAGNOSIS — K219 Gastro-esophageal reflux disease without esophagitis: Secondary | ICD-10-CM | POA: Insufficient documentation

## 2011-08-05 DIAGNOSIS — E119 Type 2 diabetes mellitus without complications: Secondary | ICD-10-CM | POA: Insufficient documentation

## 2011-08-05 DIAGNOSIS — K5190 Ulcerative colitis, unspecified, without complications: Secondary | ICD-10-CM | POA: Insufficient documentation

## 2011-08-05 DIAGNOSIS — E785 Hyperlipidemia, unspecified: Secondary | ICD-10-CM | POA: Insufficient documentation

## 2011-08-05 DIAGNOSIS — I10 Essential (primary) hypertension: Secondary | ICD-10-CM | POA: Insufficient documentation

## 2011-08-05 DIAGNOSIS — R10819 Abdominal tenderness, unspecified site: Secondary | ICD-10-CM | POA: Insufficient documentation

## 2011-08-05 DIAGNOSIS — K921 Melena: Secondary | ICD-10-CM | POA: Insufficient documentation

## 2011-08-05 LAB — PROTIME-INR
INR: 0.89 (ref 0.00–1.49)
Prothrombin Time: 12.2 seconds (ref 11.6–15.2)

## 2011-08-05 LAB — DIFFERENTIAL
Basophils Relative: 0 % (ref 0–1)
Eosinophils Absolute: 0 10*3/uL (ref 0.0–0.7)
Eosinophils Relative: 0 % (ref 0–5)
Monocytes Relative: 4 % (ref 3–12)
Neutrophils Relative %: 83 % — ABNORMAL HIGH (ref 43–77)

## 2011-08-05 LAB — COMPREHENSIVE METABOLIC PANEL
Albumin: 4.2 g/dL (ref 3.5–5.2)
Alkaline Phosphatase: 70 U/L (ref 39–117)
BUN: 13 mg/dL (ref 6–23)
Calcium: 10.1 mg/dL (ref 8.4–10.5)
Creatinine, Ser: 1.22 mg/dL (ref 0.50–1.35)
GFR calc Af Amer: 79 mL/min — ABNORMAL LOW (ref >90)
Potassium: 4.3 mEq/L (ref 3.5–5.1)
Total Protein: 7.8 g/dL (ref 6.0–8.3)

## 2011-08-05 LAB — SAMPLE TO BLOOD BANK

## 2011-08-05 LAB — CBC
Hemoglobin: 13.6 g/dL (ref 13.0–17.0)
MCH: 30.4 pg (ref 26.0–34.0)
MCHC: 33.6 g/dL (ref 30.0–36.0)
MCV: 90.4 fL (ref 78.0–100.0)

## 2011-08-05 LAB — APTT: aPTT: 32 seconds (ref 24–37)

## 2011-08-05 MED ORDER — HYDROMORPHONE HCL PF 1 MG/ML IJ SOLN
2.0000 mg | Freq: Once | INTRAMUSCULAR | Status: AC
  Administered 2011-08-05: 2 mg via INTRAVENOUS
  Filled 2011-08-05: qty 2

## 2011-08-05 MED ORDER — SODIUM CHLORIDE 0.9 % IV BOLUS (SEPSIS)
1000.0000 mL | Freq: Once | INTRAVENOUS | Status: AC
  Administered 2011-08-05: 1000 mL via INTRAVENOUS

## 2011-08-05 MED ORDER — ONDANSETRON HCL 4 MG/2ML IJ SOLN
4.0000 mg | Freq: Once | INTRAMUSCULAR | Status: AC
  Administered 2011-08-05: 4 mg via INTRAVENOUS
  Filled 2011-08-05: qty 2

## 2011-08-05 MED ORDER — OXYCODONE-ACETAMINOPHEN 5-325 MG PO TABS: 2.0000 | ORAL_TABLET | ORAL | Status: AC | PRN

## 2011-08-05 MED ORDER — SODIUM CHLORIDE 0.9 % IV SOLN: Freq: Once | INTRAVENOUS | Status: DC

## 2011-08-05 NOTE — ED Notes (Signed)
Pt states he was seen on sat for the same thing. Pt states he having diarrhea and pink tinge to water. Pt states he is having abdominal pain and nausea but no vomiting. Pt states he was seen on sat and the pain has not gotten any better

## 2011-08-05 NOTE — ED Provider Notes (Signed)
History     CSN: [id number]  Arrival date & time 08/05/11  1718   First MD Initiated Contact with Patient 08/05/11 1904      Chief Complaint  Patient presents with  . Rectal Bleeding    (Consider location/radiation/quality/duration/timing/severity/associated sxs/prior treatment) Patient is a 51 y.o. male presenting with hematochezia. The history is provided by the patient.  Rectal Bleeding    patient presents with lower abdominal pain x4 days. History of ulcerative colitis seen here recently for same and placed on prednisone and antibiotics. Patient denies vomiting notes nausea. No bloody stools noted. Denies any dizziness when standing. Pain is sharp in nature. No urinary symptoms at this time. Pain medication get some transient relief and nothing makes the symptoms worse  Past Medical History  Diagnosis Date  . Hypertension   . Inguinal hernia   . Hyperlipidemia   . Ulcer     ulcerative colitis  . Diabetes mellitus   . GERD (gastroesophageal reflux disease)   . Arthritis   . Ulcerative colitis     Past Surgical History  Procedure Date  . Colectomy   . Ileoanal anastomosis   . Appendectomy     Family History  Problem Relation Age of Onset  . Cancer Sister     History  Substance Use Topics  . Smoking status: Never Smoker   . Smokeless tobacco: Never Used  . Alcohol Use: 2.4 oz/week    4 Cans of beer per week      Review of Systems  Gastrointestinal: Positive for hematochezia.  All other systems reviewed and are negative.    Allergies  Asacol; Penicillins; Sulfa antibiotics; and Iodinated diagnostic agents  Home Medications   Current Outpatient Rx  Name Route Sig Dispense Refill  . ATENOLOL 25 MG PO TABS Oral Take 25 mg by mouth daily.      Marland Kitchen CIPROFLOXACIN HCL 500 MG PO TABS Oral Take 500 mg by mouth 2 (two) times daily. Patient takes this on a regular basis has been on it for years uses it as an anit inflammatory    . LANSOPRAZOLE 30 MG PO CPDR  Oral Take 30 mg by mouth daily.    Marland Kitchen LOPERAMIDE HCL 2 MG PO CAPS Oral Take 4 mg by mouth 3 (three) times daily.      Marland Kitchen PERCOCET 5-325 MG PO TABS Oral Take 1 tablet by mouth every 4 (four) hours as needed for pain. 20 tablet 0    Dispense as written.  Marland Kitchen PRAMOXINE HCL 1 % RE OINT Rectal Place 1 application rectally every 2 (two) hours as needed. hemorrhoids     . PREDNISONE 10 MG PO TABS Oral Take 10 mg by mouth See admin instructions. Started on 2/17 for 14 days. Tapered dose. Pt states he took his day two of 61m.    .Marland KitchenPROMETHAZINE HCL 25 MG PO TABS Oral Take 1 tablet (25 mg total) by mouth every 8 (eight) hours as needed for nausea. 20 tablet 0  . RANITIDINE HCL 300 MG PO TABS Oral Take 150 mg by mouth as needed. For acid reflux      BP 125/93  Pulse 88  Temp(Src) 98.8 F (37.1 C) (Oral)  Resp 20  Ht 5' 11"  (1.803 m)  Wt 198 lb (89.812 kg)  BMI 27.62 kg/m2  SpO2 97%  Physical Exam  Nursing note and vitals reviewed. Constitutional: He is oriented to person, place, and time. Vital signs are normal. He appears well-developed and well-nourished.  Non-toxic  appearance. No distress.  HENT:  Head: Normocephalic and atraumatic.  Eyes: Conjunctivae, EOM and lids are normal. Pupils are equal, round, and reactive to light.  Neck: Normal range of motion. Neck supple. No tracheal deviation present. No mass present.  Cardiovascular: Normal rate, regular rhythm and normal heart sounds.  Exam reveals no gallop.   No murmur heard. Pulmonary/Chest: Effort normal and breath sounds normal. No stridor. No respiratory distress. He has no decreased breath sounds. He has no wheezes. He has no rhonchi. He has no rales.  Abdominal: Soft. Normal appearance and bowel sounds are normal. He exhibits no distension. There is tenderness in the left upper quadrant and left lower quadrant. There is no rigidity, no rebound, no guarding and no CVA tenderness.  Musculoskeletal: Normal range of motion. He exhibits no  edema and no tenderness.  Neurological: He is alert and oriented to person, place, and time. He has normal strength. No cranial nerve deficit or sensory deficit. GCS eye subscore is 4. GCS verbal subscore is 5. GCS motor subscore is 6.  Skin: Skin is warm and dry. No abrasion and no rash noted.  Psychiatric: He has a normal mood and affect. His speech is normal and behavior is normal.    ED Course  Procedures (including critical care time)   Labs Reviewed  CBC  DIFFERENTIAL  COMPREHENSIVE METABOLIC PANEL  PROTIME-INR  APTT  SAMPLE TO BLOOD BANK   No results found.   No diagnosis found.    MDM  Patient given IV fluids and pain medication. He feels better at this time. He will continue his prednisone and will follow up with his gastroenterologist as needed        Leota Jacobsen, MD 08/05/11 2150

## 2011-08-05 NOTE — Discharge Instructions (Signed)
Ulcerative Colitis Ulcerative colitis is a long lasting swelling and soreness (inflammation) of the colon (large intestine). In patients with ulcerative colitis, sores (ulcers) and inflammation of the inner lining of the colon lead to illness. Ulcerative colitis can also cause problems outside the digestive tract.  Ulcerative colitis is closely related to another condition of inflammation of the intestines called Crohn's disease. Together, they are frequently referred to as inflammatory bowel disease (IBD). Ulcerative colitis and Crohn's diseases are conditions that can last years to decades. Men and women are affected equally. They most commonly begin during adolescence and early adulthood. SYMPTOMS  Common symptoms of ulcerative colitis include rectal bleeding and diarrhea. There is a wide range of symptoms among patients with this disease depending on how severe the disease is. Some of these symptoms are:  Abdominal pain or cramping.   Diarrhea.   Fever.   Tiredness (fatigue).   Weight loss.   Night sweats.   Rectal pain.   Feeling the immediate need to have a bowel movement (rectal urgency).  CAUSES  Ulcerative colitis is caused by increased activity of the immune system in the intestines. The immune system is the system that protects the body against disease such as harmful bacteria, viruses, fungi, and other foreign invaders. When the immune system overacts, it causes inflammation. The cause of the increased immune system activity is not known. This over activity causes long-lasting inflammation and ulceration. This condition may be passed down from your parents (inherited). Brothers, sisters, children, and parents of patients with IBD are more likely to develop these diseases. It is not contagious. This means you cannot catch it from someone else. DIAGNOSIS  Your caregiver may suspect ulcerative colitis based on your symptoms and exam. Blood tests may confirm that there is a problem.  You may be asked to submit a stool specimen for examination. X-rays and CT scans may be necessary. Ultimately, the diagnosis is usually made after a flexible tube is inserted via your anus and your colon is examined under sedation (colonoscopy). With this test, the specialist can take a tiny tissue sample from inside the bowel (biopsy). Examination of this biopsy tissue under a microscopy can reveal ulcerative colitis as the cause of your symptoms. TREATMENT   There is no cure for ulcerative colitis.   Complications such as massive bleeding from the colon (hemorrhage), development of a hole in the colon (perforation), or the development of precancerous or cancerous changes of the colon may require surgery.   Medications are often used to decrease inflammation and control the immune system. These include medicines related to aspirin, steroid medications, and newer and stronger medications to slow down the immune system. Some medications may be used as suppositories or enemas. A number of other medications are used or have been studied. Your caregiver will make specific recommendations.  HOME CARE INSTRUCTIONS   There is no cure for ulcerative colitis disease. The best treatment is frequent checkups with your caregiver. Periodic reevaluation is important.   Symptoms such as diarrhea can be controlled with medications. Avoid foods that have a laxative effect such fresh fruit and vegetables and dairy products. During flare ups, you can rest your bowel by staying away from solid foods. Drink clear liquids frequently during the day. Electrolyte or rehydrating fluids are best. Your caregiver can help you with suggestions. Drink often to prevent dehydration. When diarrhea has cleared, eat smaller meals and more often. Avoid food additives and stimulants such as caffeine (coffee, tea, many sodas, or chocolate).  Avoid dairy products. Enzyme supplements may help if you develop intolerance to a sugar in dairy  products (lactose). Ask your caregiver or dietitian about specific dietary instructions.   If you had surgery, be sure you understand your care instructions thoroughly, including proper care of any surgical wounds.   Take any medications exactly as prescribed.   Try to maintain a positive attitude. Learn relaxation techniques such as self hypnosis, mental imaging, and muscle relaxation. If possible, avoid stresses that aggravate your condition. Exercise regularly. Follow your diet. Always get plenty of rest.  SEEK MEDICAL CARE IF:   Your symptoms fail to improve after a week or two of new treatment.   You experience continued weight loss.   You have ongoing crampy digestion or loose bowels.   You develop a new skin rash, skin sores, or eye problems.  SEEK IMMEDIATE MEDICAL CARE IF:   You have worsening of your symptoms or develop new symptoms.   You have an oral temperature above 102 F (38.9 C), not controlled by medicine.   You develop bloody diarrhea.   You have severe abdominal pain.  Document Released: 03/12/2005 Document Revised: 02/12/2011 Document Reviewed: 02/09/2007 [facility] Patient Information 2012 [place].

## 2011-08-13 ENCOUNTER — Ambulatory Visit: Payer: Self-pay | Admitting: Gastroenterology

## 2011-09-02 ENCOUNTER — Ambulatory Visit: Payer: Self-pay | Admitting: Gastroenterology

## 2011-10-02 ENCOUNTER — Emergency Department (HOSPITAL_COMMUNITY)
Admission: EM | Admit: 2011-10-02 | Discharge: 2011-10-03 | Disposition: A | Discharge status: Home / Self Care | Payer: Self-pay | Attending: Emergency Medicine | Admitting: Emergency Medicine

## 2011-10-02 ENCOUNTER — Encounter (HOSPITAL_COMMUNITY): Payer: Self-pay | Admitting: Emergency Medicine

## 2011-10-02 DIAGNOSIS — E119 Type 2 diabetes mellitus without complications: Secondary | ICD-10-CM | POA: Insufficient documentation

## 2011-10-02 DIAGNOSIS — Z79899 Other long term (current) drug therapy: Secondary | ICD-10-CM | POA: Insufficient documentation

## 2011-10-02 DIAGNOSIS — E785 Hyperlipidemia, unspecified: Secondary | ICD-10-CM | POA: Insufficient documentation

## 2011-10-02 DIAGNOSIS — I10 Essential (primary) hypertension: Secondary | ICD-10-CM | POA: Insufficient documentation

## 2011-10-02 DIAGNOSIS — M7989 Other specified soft tissue disorders: Secondary | ICD-10-CM | POA: Insufficient documentation

## 2011-10-02 DIAGNOSIS — L98499 Non-pressure chronic ulcer of skin of other sites with unspecified severity: Secondary | ICD-10-CM | POA: Insufficient documentation

## 2011-10-02 DIAGNOSIS — M129 Arthropathy, unspecified: Secondary | ICD-10-CM | POA: Insufficient documentation

## 2011-10-02 DIAGNOSIS — R609 Edema, unspecified: Secondary | ICD-10-CM | POA: Insufficient documentation

## 2011-10-02 DIAGNOSIS — K219 Gastro-esophageal reflux disease without esophagitis: Secondary | ICD-10-CM | POA: Insufficient documentation

## 2011-10-02 MED ORDER — CEPHALEXIN 500 MG PO CAPS
500.0000 mg | ORAL_CAPSULE | Freq: Once | ORAL | Status: AC
  Administered 2011-10-02: 500 mg via ORAL
  Filled 2011-10-02: qty 1

## 2011-10-02 MED ORDER — OXYCODONE-ACETAMINOPHEN 5-325 MG PO TABS
1.0000 | ORAL_TABLET | Freq: Once | ORAL | Status: AC
  Administered 2011-10-02: 1 via ORAL
  Filled 2011-10-02: qty 1

## 2011-10-02 NOTE — ED Provider Notes (Signed)
History     CSN: [id number]  Arrival date & time 10/02/11  2220   First MD Initiated Contact with Patient 10/02/11 2234      Chief Complaint  Patient presents with  . Wound Check  . Wound Infection    (Consider location/radiation/quality/duration/timing/severity/associated sxs/prior treatment) Patient is a 51 y.o. male presenting with wound check. The history is provided by the patient.  Wound Check   the pt is a 46 y male with ulcerative colitis.  He c/o right lower ext. Skin lesion for 4 d with mild temp increase. No hx of trauma. No n/v/ chills, sweating.  Takes cipro chronically for his UC.    Past Medical History  Diagnosis Date  . Hypertension   . Inguinal hernia   . Hyperlipidemia   . Ulcer     ulcerative colitis  . Diabetes mellitus   . GERD (gastroesophageal reflux disease)   . Arthritis   . Ulcerative colitis     Past Surgical History  Procedure Date  . Colectomy   . Ileoanal anastomosis   . Appendectomy     Family History  Problem Relation Age of Onset  . Cancer Sister     History  Substance Use Topics  . Smoking status: Never Smoker   . Smokeless tobacco: Never Used  . Alcohol Use: 2.4 oz/week    4 Cans of beer per week      Review of Systems  Constitutional: Positive for fever. Negative for chills.  Gastrointestinal: Negative for nausea and vomiting.  Musculoskeletal:       Right lower leg skin lesion  Skin:       2 cm erythematous lesion with central ulceration approx. 3 mm.     Allergies  Asacol; Penicillins; Sulfa antibiotics; and Iodinated diagnostic agents  Home Medications   Current Outpatient Rx  Name Route Sig Dispense Refill  . ATENOLOL 25 MG PO TABS Oral Take 25 mg by mouth daily.      Marland Kitchen CIPROFLOXACIN HCL 500 MG PO TABS Oral Take 500 mg by mouth 2 (two) times daily. Patient takes this on a regular basis has been on it for years uses it as an anit inflammatory    . FAMOTIDINE 20 MG PO TABS Oral Take 20 mg by mouth 2 (two)  times daily.    Marland Kitchen LOPERAMIDE HCL 2 MG PO CAPS Oral Take 4 mg by mouth 3 (three) times daily.        BP 150/113  Pulse 114  Resp 20  Wt 187 lb 3.2 oz (84.913 kg)  SpO2 99%  Physical Exam  Vitals reviewed. Constitutional: He is oriented to person, place, and time. He appears well-developed and well-nourished. No distress.  HENT:  Head: Normocephalic and atraumatic.  Eyes: Conjunctivae are normal.  Neck: Normal range of motion.  Pulmonary/Chest: Effort normal.  Abdominal: He exhibits no distension.  Musculoskeletal: Normal range of motion. He exhibits edema and tenderness.       Medial prox. Tibia. 2 cm diam. Erythematous lesion with minimal swelling and mild ttp around small 3 mm central ulceration.    Neurological: He is alert and oriented to person, place, and time.  Skin: Skin is warm and dry. There is erythema.  Psychiatric: He has a normal mood and affect. Thought content normal.    ED Course  Procedures (including critical care time) Superficial skin ulceration  In pt with UC Not toxic. No distress.   Labs Reviewed - No data to display No results found.  No diagnosis found.    MDM  Superficial skin ulceration , right lower leg.        Barbara Cower, MD 10/02/11 [phone]

## 2011-10-02 NOTE — ED Notes (Addendum)
Pt presented to the ER with c/o right leg pain 3-4/10 at this time secondary to the wound to the same area,pt states it is possible spider bite, noted swelling,redness and open with some puss d/c from the site. 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

## 2011-10-03 MED ORDER — CEPHALEXIN 500 MG PO CAPS: 500.0000 mg | ORAL_CAPSULE | Freq: Four times a day (QID) | ORAL | Status: AC

## 2011-10-03 MED ORDER — BACITRACIN ZINC 500 UNIT/GM EX OINT
TOPICAL_OINTMENT | CUTANEOUS | Status: AC
  Filled 2011-10-03: qty 0.9

## 2011-10-03 MED ORDER — OXYCODONE-ACETAMINOPHEN 5-325 MG PO TABS: 1.0000 | ORAL_TABLET | Freq: Four times a day (QID) | ORAL | Status: AC | PRN

## 2011-10-03 NOTE — Discharge Instructions (Signed)
Take medications as prescribed.  Follow up with your doctor for recheck in 3-5 days.  Return the emergency department for worsening condition or new concerning symptoms.

## 2011-10-26 ENCOUNTER — Encounter (HOSPITAL_COMMUNITY): Payer: Self-pay | Admitting: Emergency Medicine

## 2011-10-26 ENCOUNTER — Emergency Department (HOSPITAL_COMMUNITY)
Admission: EM | Admit: 2011-10-26 | Discharge: 2011-10-27 | Disposition: A | Discharge status: Home / Self Care | Payer: Self-pay | Attending: Emergency Medicine | Admitting: Emergency Medicine

## 2011-10-26 DIAGNOSIS — K625 Hemorrhage of anus and rectum: Secondary | ICD-10-CM | POA: Insufficient documentation

## 2011-10-26 DIAGNOSIS — K219 Gastro-esophageal reflux disease without esophagitis: Secondary | ICD-10-CM | POA: Insufficient documentation

## 2011-10-26 DIAGNOSIS — E785 Hyperlipidemia, unspecified: Secondary | ICD-10-CM | POA: Insufficient documentation

## 2011-10-26 DIAGNOSIS — E119 Type 2 diabetes mellitus without complications: Secondary | ICD-10-CM | POA: Insufficient documentation

## 2011-10-26 DIAGNOSIS — R109 Unspecified abdominal pain: Secondary | ICD-10-CM | POA: Insufficient documentation

## 2011-10-26 DIAGNOSIS — K5190 Ulcerative colitis, unspecified, without complications: Secondary | ICD-10-CM | POA: Insufficient documentation

## 2011-10-26 DIAGNOSIS — I10 Essential (primary) hypertension: Secondary | ICD-10-CM | POA: Insufficient documentation

## 2011-10-26 NOTE — ED Notes (Signed)
Pt alert, nad, arrives from home, c/o bright rectal bleeding, onset a week ago, seen several times with same c/o hx of several bowel sx, resp even unlabored, skin pwd

## 2011-10-27 ENCOUNTER — Emergency Department (HOSPITAL_COMMUNITY): Payer: Self-pay

## 2011-10-27 LAB — BASIC METABOLIC PANEL
BUN: 9 mg/dL (ref 6–23)
Chloride: 105 mEq/L (ref 96–112)
GFR calc Af Amer: 79 mL/min — ABNORMAL LOW (ref >90)
GFR calc non Af Amer: 69 mL/min — ABNORMAL LOW (ref >90)
Potassium: 3.9 mEq/L (ref 3.5–5.1)

## 2011-10-27 LAB — DIFFERENTIAL
Basophils Absolute: 0 10*3/uL (ref 0.0–0.1)
Basophils Relative: 0 % (ref 0–1)
Eosinophils Absolute: 0.1 10*3/uL (ref 0.0–0.7)
Neutro Abs: 2.4 10*3/uL (ref 1.7–7.7)
Neutrophils Relative %: 46 % (ref 43–77)

## 2011-10-27 LAB — CBC
MCH: 30.7 pg (ref 26.0–34.0)
MCHC: 33.7 g/dL (ref 30.0–36.0)
Platelets: 164 10*3/uL (ref 150–400)
RDW: 14.3 % (ref 11.5–15.5)

## 2011-10-27 LAB — OCCULT BLOOD, POC DEVICE: Fecal Occult Bld: POSITIVE

## 2011-10-27 MED ORDER — DICYCLOMINE HCL 20 MG PO TABS: 20.0000 mg | ORAL_TABLET | Freq: Four times a day (QID) | ORAL | Status: DC | PRN

## 2011-10-27 MED ORDER — ONDANSETRON HCL 4 MG/2ML IJ SOLN
4.0000 mg | Freq: Once | INTRAMUSCULAR | Status: AC
  Administered 2011-10-27: 4 mg via INTRAVENOUS
  Filled 2011-10-27: qty 2

## 2011-10-27 MED ORDER — HYDROMORPHONE HCL PF 1 MG/ML IJ SOLN
1.0000 mg | Freq: Once | INTRAMUSCULAR | Status: AC
  Administered 2011-10-27: 1 mg via INTRAVENOUS
  Filled 2011-10-27: qty 1

## 2011-10-27 MED ORDER — PREDNISONE 20 MG PO TABS: 60.0000 mg | ORAL_TABLET | Freq: Every day | ORAL | Status: AC

## 2011-10-27 MED ORDER — OXYCODONE-ACETAMINOPHEN 5-325 MG PO TABS: 2.0000 | ORAL_TABLET | ORAL | Status: AC | PRN

## 2011-10-27 MED ORDER — IOHEXOL 300 MG/ML  SOLN
100.0000 mL | Freq: Once | INTRAMUSCULAR | Status: AC | PRN
  Administered 2011-10-27: 100 mL via INTRAVENOUS

## 2011-10-27 MED ORDER — ONDANSETRON HCL 4 MG PO TABS: 4.0000 mg | ORAL_TABLET | Freq: Four times a day (QID) | ORAL | Status: AC

## 2011-10-27 NOTE — ED Notes (Signed)
Patient transported to CT and returned

## 2011-10-27 NOTE — Discharge Instructions (Signed)
Please take the patient as prescribed. Contact Dr. Deatra Ina for followup in the next week. Return to the emergency department for fevers, vomiting, worsening pain or other concerning symptoms.  Ulcerative Colitis Ulcerative colitis is a long lasting swelling and soreness (inflammation) of the colon (large intestine). In patients with ulcerative colitis, sores (ulcers) and inflammation of the inner lining of the colon lead to illness. Ulcerative colitis can also cause problems outside the digestive tract.  Ulcerative colitis is closely related to another condition of inflammation of the intestines called Crohn's disease. Together, they are frequently referred to as inflammatory bowel disease (IBD). Ulcerative colitis and Crohn's diseases are conditions that can last years to decades. Men and women are affected equally. They most commonly begin during adolescence and early adulthood. SYMPTOMS  Common symptoms of ulcerative colitis include rectal bleeding and diarrhea. There is a wide range of symptoms among patients with this disease depending on how severe the disease is. Some of these symptoms are:  Abdominal pain or cramping.   Diarrhea.   Fever.   Tiredness (fatigue).   Weight loss.   Night sweats.   Rectal pain.   Feeling the immediate need to have a bowel movement (rectal urgency).  CAUSES  Ulcerative colitis is caused by increased activity of the immune system in the intestines. The immune system is the system that protects the body against disease such as harmful bacteria, viruses, fungi, and other foreign invaders. When the immune system overacts, it causes inflammation. The cause of the increased immune system activity is not known. This over activity causes long-lasting inflammation and ulceration. This condition may be passed down from your parents (inherited). Brothers, sisters, children, and parents of patients with IBD are more likely to develop these diseases. It is not  contagious. This means you cannot catch it from someone else. DIAGNOSIS  Your caregiver may suspect ulcerative colitis based on your symptoms and exam. Blood tests may confirm that there is a problem. You may be asked to submit a stool specimen for examination. X-rays and CT scans may be necessary. Ultimately, the diagnosis is usually made after a flexible tube is inserted via your anus and your colon is examined under sedation (colonoscopy). With this test, the specialist can take a tiny tissue sample from inside the bowel (biopsy). Examination of this biopsy tissue under a microscopy can reveal ulcerative colitis as the cause of your symptoms. TREATMENT   There is no cure for ulcerative colitis.   Complications such as massive bleeding from the colon (hemorrhage), development of a hole in the colon (perforation), or the development of precancerous or cancerous changes of the colon may require surgery.   Medications are often used to decrease inflammation and control the immune system. These include medicines related to aspirin, steroid medications, and newer and stronger medications to slow down the immune system. Some medications may be used as suppositories or enemas. A number of other medications are used or have been studied. Your caregiver will make specific recommendations.  HOME CARE INSTRUCTIONS   There is no cure for ulcerative colitis disease. The best treatment is frequent checkups with your caregiver. Periodic reevaluation is important.   Symptoms such as diarrhea can be controlled with medications. Avoid foods that have a laxative effect such fresh fruit and vegetables and dairy products. During flare ups, you can rest your bowel by staying away from solid foods. Drink clear liquids frequently during the day. Electrolyte or rehydrating fluids are best. Your caregiver can help you with  suggestions. Drink often to prevent dehydration. When diarrhea has cleared, eat smaller meals and more  often. Avoid food additives and stimulants such as caffeine (coffee, tea, many sodas, or chocolate). Avoid dairy products. Enzyme supplements may help if you develop intolerance to a sugar in dairy products (lactose). Ask your caregiver or dietitian about specific dietary instructions.   If you had surgery, be sure you understand your care instructions thoroughly, including proper care of any surgical wounds.   Take any medications exactly as prescribed.   Try to maintain a positive attitude. Learn relaxation techniques such as self hypnosis, mental imaging, and muscle relaxation. If possible, avoid stresses that aggravate your condition. Exercise regularly. Follow your diet. Always get plenty of rest.  SEEK MEDICAL CARE IF:   Your symptoms fail to improve after a week or two of new treatment.   You experience continued weight loss.   You have ongoing crampy digestion or loose bowels.   You develop a new skin rash, skin sores, or eye problems.  SEEK IMMEDIATE MEDICAL CARE IF:   You have worsening of your symptoms or develop new symptoms.   You have an oral temperature above 102 F (38.9 C), not controlled by medicine.   You develop bloody diarrhea.   You have severe abdominal pain.  Document Released: 03/12/2005 Document Revised: 05/22/2011 Document Reviewed: 02/09/2007 [facility] Patient Information 2012 [place].

## 2011-10-27 NOTE — ED Provider Notes (Signed)
History     CSN: [id number]  Arrival date & time 10/26/11  2116   First MD Initiated Contact with Patient 10/26/11 2310      Chief Complaint  Patient presents with  . Rectal Bleeding    (Consider location/radiation/quality/duration/timing/severity/associated sxs/prior treatment) HPI 51 year old male presents to emergency room complaining of one week of intermittent rectal bleeding. Patient with history of ulcerative colitis. Patient currently does not have a gastroenterologist, but has been referred to [facility], Dr. Deatra Ina. He has not had the funds to have his first evaluation there. Patient also complains of pain in his left inguinal region secondary to a hernia. Patient has been unable to schedule surgical repair of his inguinal hernia. Patient reports he is currently wearing his binder all the time to help with pain. Patient also complaining of a "knot" in his mid lower abdomen just adjacent to one of his surgical scars. This knot has been increasing in pain over the last day. Patient reports he has frank blood at times with bowel movements and other times no blood. He has had no fever. Patient has chronic diarrhea status post colectomy. Patient has been had nausea without vomiting. Patient has had decreased appetite. Patient is followed at [facility] by Dr Launa Flight   Past Medical History  Diagnosis Date  . Hypertension   . Inguinal hernia   . Hyperlipidemia   . Ulcer     ulcerative colitis  . Diabetes mellitus   . GERD (gastroesophageal reflux disease)   . Arthritis   . Ulcerative colitis     Past Surgical History  Procedure Date  . Colectomy   . Ileoanal anastomosis   . Appendectomy     Family History  Problem Relation Age of Onset  . Cancer Sister     History  Substance Use Topics  . Smoking status: Never Smoker   . Smokeless tobacco: Never Used  . Alcohol Use: 2.4 oz/week    4 Cans of beer per week      Review of Systems  All other systems reviewed and are  negative.    Allergies  Asacol; Penicillins; Sulfa antibiotics; and Iodinated diagnostic agents  Home Medications   Current Outpatient Rx  Name Route Sig Dispense Refill  . ATENOLOL 25 MG PO TABS Oral Take 25 mg by mouth daily.      Marland Kitchen CIPROFLOXACIN HCL 500 MG PO TABS Oral Take 500 mg by mouth 2 (two) times daily. Patient takes this on a regular basis has been on it for years uses it as an anit inflammatory    . FAMOTIDINE 20 MG PO TABS Oral Take 20 mg by mouth 2 (two) times daily.    Marland Kitchen LOPERAMIDE HCL 2 MG PO CAPS Oral Take 4 mg by mouth 3 (three) times daily.        BP 149/89  Pulse 87  Temp(Src) 98 F (36.7 C) (Oral)  Resp 16  Wt 187 lb (84.823 kg)  SpO2 99%  Physical Exam  Nursing note and vitals reviewed. Constitutional: He appears well-developed and well-nourished. He appears distressed.  HENT:  Head: Normocephalic and atraumatic.  Eyes: Conjunctivae and EOM are normal. Pupils are equal, round, and reactive to light.  Neck: Normal range of motion. Neck supple. No JVD present. No tracheal deviation present. No thyromegaly present.  Cardiovascular: Normal rate, regular rhythm, normal heart sounds and intact distal pulses.  Exam reveals no gallop and no friction rub.   No murmur heard. Pulmonary/Chest: Effort normal and breath sounds  normal. No stridor. No respiratory distress. He has no wheezes. He has no rales. He exhibits no tenderness.  Abdominal: Soft. He exhibits mass. He exhibits no distension. There is tenderness. There is no rebound and no guarding.       Has hyperactive bowel sounds. Multiple surgical scars noted. Patient with hard 3 cm mass just medial to a surgical scar in his right mid lower abdomen that is tender to palpation and I am unable to reduce if it is a incisional hernia. Patient noted to have left inguinal hernia that is reducible. Patient with diffuse abdominal pain over entire abdomen without rebound or guarding  Genitourinary: Guaiac positive stool.    Musculoskeletal: Normal range of motion. He exhibits no edema and no tenderness.  Lymphadenopathy:    He has no cervical adenopathy.  Skin: Skin is warm and dry. No rash noted. No erythema. No pallor.    ED Course  Procedures (including critical care time)  Labs Reviewed  CBC - Abnormal; Notable for the following:    HCT 38.6 (*)    All other components within normal limits  BASIC METABOLIC PANEL - Abnormal; Notable for the following:    GFR calc non Af Amer 69 (*)    GFR calc Af Amer 79 (*)    All other components within normal limits  DIFFERENTIAL  OCCULT BLOOD, POC DEVICE   Ct Abdomen Pelvis W Contrast  10/27/2011  *RADIOLOGY REPORT*  Clinical Data: Abdominal pain.  Rectal bleeding.  History of ulcerative colitis post colectomy.  CT ABDOMEN AND PELVIS WITH CONTRAST  Technique:  Multidetector CT imaging of the abdomen and pelvis was performed following the standard protocol during bolus administration of intravenous contrast.  Contrast: 135m OMNIPAQUE IOHEXOL 300 MG/ML  SOLN  Comparison: 07/22/2011  Findings: Dependent atelectasis in the lung bases.  The liver, spleen, gallbladder, pancreas, adrenal glands, abdominal aorta, kidneys, and retroperitoneal lymph nodes are unremarkable. No free air or free fluid in the abdomen.  Stomach and small bowel are not distended.  Pelvis:  Postoperative changes with colonic resection and ileal anal anastomoses.  No bowel obstruction.  No free air or free fluid in the pelvis.  Prostate gland is not enlarged.  Bladder wall is not thickened.  Fat in the inguinal canals.  No significant changes since the previous study.  IMPRESSION: Postoperative changes with colonic resection and ileal anal anastomoses.  Stable appearance since previous study.  No focal acute process demonstrated in the abdomen or pelvis.  Original Report Authenticated By: WNeale Burly M.D.     1. Rectal bleeding   2. Ulcerative colitis   3. Abdominal pain       MDM   Patient with ulcerative colitis with rectal bleeding in stool for the last 7-10 days. Normal H&H. Concern for possible incisional hernia. To have CT abdomen pelvis    5:02 AM No acute findings on workup today.  Will d/c on prednisone, percocet, bentyl.  Pt to f/u with Dr KDeatra Ina    OKalman Drape MD 10/27/11 [id number]

## 2011-12-01 ENCOUNTER — Emergency Department (HOSPITAL_COMMUNITY)
Admission: EM | Admit: 2011-12-01 | Discharge: 2011-12-01 | Disposition: A | Discharge status: Home / Self Care | Payer: Self-pay | Attending: Emergency Medicine | Admitting: Emergency Medicine

## 2011-12-01 ENCOUNTER — Encounter (HOSPITAL_COMMUNITY): Payer: Self-pay | Admitting: *Deleted

## 2011-12-01 DIAGNOSIS — Z79899 Other long term (current) drug therapy: Secondary | ICD-10-CM | POA: Insufficient documentation

## 2011-12-01 DIAGNOSIS — K219 Gastro-esophageal reflux disease without esophagitis: Secondary | ICD-10-CM | POA: Insufficient documentation

## 2011-12-01 DIAGNOSIS — W260XXA Contact with knife, initial encounter: Secondary | ICD-10-CM | POA: Insufficient documentation

## 2011-12-01 DIAGNOSIS — I10 Essential (primary) hypertension: Secondary | ICD-10-CM | POA: Insufficient documentation

## 2011-12-01 DIAGNOSIS — Z882 Allergy status to sulfonamides status: Secondary | ICD-10-CM | POA: Insufficient documentation

## 2011-12-01 DIAGNOSIS — W261XXA Contact with sword or dagger, initial encounter: Secondary | ICD-10-CM | POA: Insufficient documentation

## 2011-12-01 DIAGNOSIS — S61211A Laceration without foreign body of left index finger without damage to nail, initial encounter: Secondary | ICD-10-CM

## 2011-12-01 DIAGNOSIS — Z880 Allergy status to penicillin: Secondary | ICD-10-CM | POA: Insufficient documentation

## 2011-12-01 DIAGNOSIS — K5190 Ulcerative colitis, unspecified, without complications: Secondary | ICD-10-CM | POA: Insufficient documentation

## 2011-12-01 DIAGNOSIS — Z91041 Radiographic dye allergy status: Secondary | ICD-10-CM | POA: Insufficient documentation

## 2011-12-01 DIAGNOSIS — E119 Type 2 diabetes mellitus without complications: Secondary | ICD-10-CM | POA: Insufficient documentation

## 2011-12-01 DIAGNOSIS — S61209A Unspecified open wound of unspecified finger without damage to nail, initial encounter: Secondary | ICD-10-CM | POA: Insufficient documentation

## 2011-12-01 DIAGNOSIS — Z980 Intestinal bypass and anastomosis status: Secondary | ICD-10-CM | POA: Insufficient documentation

## 2011-12-01 MED ORDER — LIDOCAINE HCL (PF) 2 % IJ SOLN: 10.0000 mL | Freq: Once | INTRAMUSCULAR | Status: DC

## 2011-12-01 MED ORDER — LIDOCAINE HCL 2 % IJ SOLN
INTRAMUSCULAR | Status: AC
  Filled 2011-12-01: qty 1

## 2011-12-01 NOTE — ED Notes (Signed)
Pt accidentally cut left index finger with a pocket knife.  Thinks tetanus shot is uptodate

## 2011-12-01 NOTE — ED Provider Notes (Signed)
History   This chart was scribed for Matthew Fuel, MD by [name]. The patient was seen in room STRE2/STRE2. Patient's care was started at 1819.     CSN: [id number]  Arrival date & time 12/01/11  1819   None     Chief Complaint  Patient presents with  . Laceration    left index    (Consider location/radiation/quality/duration/timing/severity/associated sxs/prior treatment) Patient is a 51 y.o. male presenting with skin laceration. The history is provided by the patient. No language interpreter was used.  Laceration  The incident occurred 1 to 2 hours ago. The laceration is located on the left hand.   Matthew Francis is a 51 y.o. male who presents to the Emergency Department complaining of a laceration and mild pain to the left index finger after patient accidentally cut himself with a pocket knife. Patient says that wound bled when laceration first occurred and is still draining a minimal amount of blood.   Patient with h/o HTN, inguinal hernia, hyperlipidemia, ulcer, diabetes, GERD, arthritis, and ulcerative colitis. Patient with surgical h/o colectomy, ileoanal anastomosis and appendectomy. Patient has never smoked.   Past Medical History  Diagnosis Date  . Hypertension   . Inguinal hernia   . Hyperlipidemia   . Ulcer     ulcerative colitis  . Diabetes mellitus   . GERD (gastroesophageal reflux disease)   . Arthritis   . Ulcerative colitis     Past Surgical History  Procedure Date  . Colectomy   . Ileoanal anastomosis   . Appendectomy     Family History  Problem Relation Age of Onset  . Cancer Sister     History  Substance Use Topics  . Smoking status: Never Smoker   . Smokeless tobacco: Never Used  . Alcohol Use: 2.4 oz/week    4 Cans of beer per week      Review of Systems A complete 10 system review of systems was obtained and all systems are negative except as noted in the HPI and PMH.   Allergies  Asacol; Penicillins; Sulfa antibiotics; and  Iodinated diagnostic agents  Home Medications   Current Outpatient Rx  Name Route Sig Dispense Refill  . ATENOLOL 25 MG PO TABS Oral Take 25 mg by mouth daily.      Marland Kitchen CIPROFLOXACIN HCL 500 MG PO TABS Oral Take 500 mg by mouth 2 (two) times daily. Patient takes this on a regular basis has been on it for years uses it as an anit inflammatory    . FAMOTIDINE 20 MG PO TABS Oral Take 20 mg by mouth 2 (two) times daily.    Marland Kitchen LOPERAMIDE HCL 2 MG PO CAPS Oral Take 4 mg by mouth 3 (three) times daily.        BP 129/93  Pulse 92  Temp 98.3 F (36.8 C) (Oral)  Resp 18  SpO2 99%  Physical Exam  Nursing note and vitals reviewed. Constitutional: He is oriented to person, place, and time. He appears well-developed and well-nourished. No distress.  HENT:  Head: Normocephalic and atraumatic.  Eyes: Conjunctivae and EOM are normal.  Neck: Neck supple.  Cardiovascular: Normal rate.   Pulmonary/Chest: Effort normal.  Musculoskeletal: Normal range of motion.       Laceration to distal phalanx of the left finger, radial side.  Neurological: He is alert and oriented to person, place, and time. No sensory deficit.  Skin: Skin is dry.  Psychiatric: He has a normal mood and affect. His behavior  is normal.    ED Course  Procedures (including critical care time) DIAGNOSTIC STUDIES: Oxygen Saturation is 99% on room air, normal by my interpretation.    COORDINATION OF CARE: 7:26PM- Patient informed of current plan for treatment and evaluation and agrees with plan at this time.   7:45PM- Performed laceration repair with sutures. Recommended patient take Tylenol or Ibuprofen to manage pain. Informed patient that he should not immerse sutured finger in water. Informed patient that he can visit the ED, PCP or Urgent Care to have stiches removed in 1 week.  LACERATION REPAIR Performed by: Matthew Fuel MD Consent: Verbal consent obtained. Risks and benefits: risks, benefits and alternatives were  discussed Patient identity confirmed: provided demographic data Time out performed prior to procedure Prepped and Draped in normal sterile fashion Wound explored  Laceration Location: distal phalanx of left finger, radial side  Laceration Length: 2 cm  No Foreign Bodies seen or palpated  Anesthesia: local infiltration  Local anesthetic: lidocaine 2*% w/o epinephrine  Anesthetic total: 10 ml  Irrigation method: syringe Amount of cleaning: standard  Skin closure: Close  Number of sutures or staples: 4 (5.0 Prolene)  Technique: Simple  Patient tolerance: Patient tolerated the procedure well with no immediate complications.  Labs Reviewed - No data to display No results found.   1. Laceration of index finger of left hand without complication       MDM  Laceration left second finger requiring suture closure.      I personally performed the services described in this documentation, which was scribed in my presence. The recorded information has been reviewed and considered.      Matthew Fuel, MD 47/15/95 [id number]

## 2011-12-01 NOTE — Discharge Instructions (Signed)
Stitches need to be removed in 7 days. That can be done here, at an urgent care center, or at your doctor's office.  Laceration Care, Adult A laceration is a cut or lesion that goes through all layers of the skin and into the tissue just beneath the skin. TREATMENT  Some lacerations may not require closure. Some lacerations may not be able to be closed due to an increased risk of infection. It is important to see your caregiver as soon as possible after an injury to minimize the risk of infection and maximize the opportunity for successful closure. If closure is appropriate, pain medicines may be given, if needed. The wound will be cleaned to help prevent infection. Your caregiver will use stitches (sutures), staples, wound glue (adhesive), or skin adhesive strips to repair the laceration. These tools bring the skin edges together to allow for faster healing and a better cosmetic outcome. However, all wounds will heal with a scar. Once the wound has healed, scarring can be minimized by covering the wound with sunscreen during the day for 1 full year. HOME CARE INSTRUCTIONS  For sutures or staples:  Keep the wound clean and dry.   If you were given a bandage (dressing), you should change it at least once a day. Also, change the dressing if it becomes wet or dirty, or as directed by your caregiver.   Wash the wound with soap and water 2 times a day. Rinse the wound off with water to remove all soap. Pat the wound dry with a clean towel.   After cleaning, apply a thin layer of the antibiotic ointment as recommended by your caregiver. This will help prevent infection and keep the dressing from sticking.   You may shower as usual after the first 24 hours. Do not soak the wound in water until the sutures are removed.   Only take over-the-counter or prescription medicines for pain, discomfort, or fever as directed by your caregiver.   Get your sutures or staples removed as directed by your caregiver.    For skin adhesive strips:  Keep the wound clean and dry.   Do not get the skin adhesive strips wet. You may bathe carefully, using caution to keep the wound dry.   If the wound gets wet, pat it dry with a clean towel.   Skin adhesive strips will fall off on their own. You may trim the strips as the wound heals. Do not remove skin adhesive strips that are still stuck to the wound. They will fall off in time.  For wound adhesive:  You may briefly wet your wound in the shower or bath. Do not soak or scrub the wound. Do not swim. Avoid periods of heavy perspiration until the skin adhesive has fallen off on its own. After showering or bathing, gently pat the wound dry with a clean towel.   Do not apply liquid medicine, cream medicine, or ointment medicine to your wound while the skin adhesive is in place. This may loosen the film before your wound is healed.   If a dressing is placed over the wound, be careful not to apply tape directly over the skin adhesive. This may cause the adhesive to be pulled off before the wound is healed.   Avoid prolonged exposure to sunlight or tanning lamps while the skin adhesive is in place. Exposure to ultraviolet light in the first year will darken the scar.   The skin adhesive will usually remain in place for 5 to  10 days, then naturally fall off the skin. Do not pick at the adhesive film.  You may need a tetanus shot if:  You cannot remember when you had your last tetanus shot.   You have never had a tetanus shot.  If you get a tetanus shot, your arm may swell, get red, and feel warm to the touch. This is common and not a problem. If you need a tetanus shot and you choose not to have one, there is a rare chance of getting tetanus. Sickness from tetanus can be serious. SEEK MEDICAL CARE IF:   You have redness, swelling, or increasing pain in the wound.   You see a red line that goes away from the wound.   You have yellowish-white fluid (pus) coming from  the wound.   You have a fever.   You notice a bad smell coming from the wound or dressing.   Your wound breaks open before or after sutures have been removed.   You notice something coming out of the wound such as wood or glass.   Your wound is on your hand or foot and you cannot move a finger or toe.  SEEK IMMEDIATE MEDICAL CARE IF:   Your pain is not controlled with prescribed medicine.   You have severe swelling around the wound causing pain and numbness or a change in color in your arm, hand, leg, or foot.   Your wound splits open and starts bleeding.   You have worsening numbness, weakness, or loss of function of any joint around or beyond the wound.   You develop painful lumps near the wound or on the skin anywhere on your body.  MAKE SURE YOU:   Understand these instructions.   Will watch your condition.   Will get help right away if you are not doing well or get worse.  Document Released: 06/02/2005 Document Revised: 05/22/2011 Document Reviewed: 11/26/2010 [facility] Patient Information 2012 [place].

## 2011-12-01 NOTE — ED Notes (Signed)
Dr. Roxanne Mins at bedside to suture pt

## 2011-12-01 NOTE — ED Notes (Signed)
No rx given, pt voiced understanding to f/u in 1 week for stitch removal.

## 2011-12-25 ENCOUNTER — Emergency Department (HOSPITAL_COMMUNITY)
Admission: EM | Admit: 2011-12-25 | Discharge: 2011-12-25 | Disposition: A | Discharge status: Home / Self Care | Payer: Self-pay | Attending: Emergency Medicine | Admitting: Emergency Medicine

## 2011-12-25 ENCOUNTER — Emergency Department (HOSPITAL_COMMUNITY): Payer: Self-pay

## 2011-12-25 DIAGNOSIS — K5190 Ulcerative colitis, unspecified, without complications: Secondary | ICD-10-CM | POA: Insufficient documentation

## 2011-12-25 DIAGNOSIS — E119 Type 2 diabetes mellitus without complications: Secondary | ICD-10-CM | POA: Insufficient documentation

## 2011-12-25 DIAGNOSIS — R110 Nausea: Secondary | ICD-10-CM | POA: Insufficient documentation

## 2011-12-25 DIAGNOSIS — R109 Unspecified abdominal pain: Secondary | ICD-10-CM | POA: Insufficient documentation

## 2011-12-25 DIAGNOSIS — E785 Hyperlipidemia, unspecified: Secondary | ICD-10-CM | POA: Insufficient documentation

## 2011-12-25 DIAGNOSIS — K219 Gastro-esophageal reflux disease without esophagitis: Secondary | ICD-10-CM | POA: Insufficient documentation

## 2011-12-25 DIAGNOSIS — M129 Arthropathy, unspecified: Secondary | ICD-10-CM | POA: Insufficient documentation

## 2011-12-25 DIAGNOSIS — I10 Essential (primary) hypertension: Secondary | ICD-10-CM | POA: Insufficient documentation

## 2011-12-25 LAB — CBC WITH DIFFERENTIAL/PLATELET
Lymphocytes Relative: 30 % (ref 12–46)
Lymphs Abs: 1.4 10*3/uL (ref 0.7–4.0)
Neutrophils Relative %: 62 % (ref 43–77)
Platelets: 211 10*3/uL (ref 150–400)
RBC: 4.6 MIL/uL (ref 4.22–5.81)
WBC: 4.6 10*3/uL (ref 4.0–10.5)

## 2011-12-25 LAB — COMPREHENSIVE METABOLIC PANEL
ALT: 12 U/L (ref 0–53)
Alkaline Phosphatase: 77 U/L (ref 39–117)
CO2: 24 mEq/L (ref 19–32)
Chloride: 104 mEq/L (ref 96–112)
GFR calc Af Amer: 81 mL/min — ABNORMAL LOW (ref >90)
GFR calc non Af Amer: 70 mL/min — ABNORMAL LOW (ref >90)
Glucose, Bld: 101 mg/dL — ABNORMAL HIGH (ref 70–99)
Potassium: 4.2 mEq/L (ref 3.5–5.1)
Sodium: 138 mEq/L (ref 135–145)
Total Bilirubin: 0.5 mg/dL (ref 0.3–1.2)

## 2011-12-25 LAB — URINE MICROSCOPIC-ADD ON

## 2011-12-25 LAB — URINALYSIS, ROUTINE W REFLEX MICROSCOPIC
Bilirubin Urine: NEGATIVE
Ketones, ur: NEGATIVE mg/dL
Nitrite: NEGATIVE
pH: 6 (ref 5.0–8.0)

## 2011-12-25 MED ORDER — HYDROMORPHONE HCL PF 1 MG/ML IJ SOLN
1.0000 mg | Freq: Once | INTRAMUSCULAR | Status: AC
  Administered 2011-12-25: 1 mg via INTRAVENOUS
  Filled 2011-12-25: qty 1

## 2011-12-25 MED ORDER — SODIUM CHLORIDE 0.9 % IV BOLUS (SEPSIS)
1000.0000 mL | Freq: Once | INTRAVENOUS | Status: AC
  Administered 2011-12-25: 1000 mL via INTRAVENOUS

## 2011-12-25 MED ORDER — SODIUM CHLORIDE 0.9 % IV SOLN
INTRAVENOUS | Status: DC
  Administered 2011-12-25: 19:00:00 via INTRAVENOUS

## 2011-12-25 MED ORDER — PREDNISONE 20 MG PO TABS: 20.0000 mg | ORAL_TABLET | Freq: Every day | ORAL | Status: AC

## 2011-12-25 MED ORDER — PREDNISONE 20 MG PO TABS: 20.0000 mg | ORAL_TABLET | Freq: Every day | ORAL | Status: DC

## 2011-12-25 MED ORDER — ONDANSETRON HCL 8 MG PO TABS: 8.0000 mg | ORAL_TABLET | ORAL | Status: AC | PRN

## 2011-12-25 MED ORDER — METHYLPREDNISOLONE SODIUM SUCC 125 MG IJ SOLR
125.0000 mg | Freq: Once | INTRAMUSCULAR | Status: AC
  Administered 2011-12-25: 125 mg via INTRAVENOUS
  Filled 2011-12-25: qty 2

## 2011-12-25 MED ORDER — OXYCODONE-ACETAMINOPHEN 5-325 MG PO TABS: 1.0000 | ORAL_TABLET | Freq: Four times a day (QID) | ORAL | Status: AC | PRN

## 2011-12-25 MED ORDER — ONDANSETRON HCL 4 MG/2ML IJ SOLN
4.0000 mg | Freq: Once | INTRAMUSCULAR | Status: AC
  Administered 2011-12-25: 4 mg via INTRAVENOUS
  Filled 2011-12-25: qty 2

## 2011-12-25 NOTE — ED Notes (Signed)
Pt made aware of need for urine and given a urinal.  Pt states "I cant tell you the last time I had to pee, but I'll try."

## 2011-12-25 NOTE — ED Provider Notes (Signed)
History     CSN: [id number]  Arrival date & time 12/25/11  1636   First MD Initiated Contact with Patient 12/25/11 1705      No chief complaint on file.   (Consider location/radiation/quality/duration/timing/severity/associated sxs/prior treatment) HPI.... lower abdominal pain for approximately one week.  Pain is crampy, colicky, associated with nausea. Status post diagnosis of ulcerative colitis in 1995 leading to J-pouch at [facility] in 2003 patient has had multiple surgeries and multiple CT scans. No fever sweats or chills, no bloody stool, no hematemesis. Steroids usually makes symptoms better. No radiation of pain. Pain is moderate  Past Medical History  Diagnosis Date  . Hypertension   . Inguinal hernia   . Hyperlipidemia   . Ulcer     ulcerative colitis  . Diabetes mellitus   . GERD (gastroesophageal reflux disease)   . Arthritis   . Ulcerative colitis     Past Surgical History  Procedure Date  . Colectomy   . Ileoanal anastomosis   . Appendectomy     Family History  Problem Relation Age of Onset  . Cancer Sister     History  Substance Use Topics  . Smoking status: Never Smoker   . Smokeless tobacco: Never Used  . Alcohol Use: 2.4 oz/week    4 Cans of beer per week      Review of Systems  All other systems reviewed and are negative.    Allergies  Asacol; Penicillins; Sulfa antibiotics; and Iodinated diagnostic agents  Home Medications   Current Outpatient Rx  Name Route Sig Dispense Refill  . CIPROFLOXACIN HCL 500 MG PO TABS Oral Take 500 mg by mouth 2 (two) times daily. Patient takes this on a regular basis has been on it for years uses it as an anit inflammatory    . FAMOTIDINE 20 MG PO TABS Oral Take 20 mg by mouth 2 (two) times daily.    Marland Kitchen LOPERAMIDE HCL 2 MG PO CAPS Oral Take 4 mg by mouth 3 (three) times daily.      . ADULT MULTIVITAMIN W/MINERALS CH Oral Take 1 tablet by mouth daily.    . ATENOLOL 25 MG PO TABS Oral Take 25 mg by  mouth daily.      Marland Kitchen ONDANSETRON HCL 8 MG PO TABS Oral Take 1 tablet (8 mg total) by mouth every 4 (four) hours as needed for nausea. 10 tablet 0  . OXYCODONE-ACETAMINOPHEN 5-325 MG PO TABS Oral Take 1-2 tablets by mouth every 6 (six) hours as needed for pain. 30 tablet 0  . PREDNISONE 20 MG PO TABS Oral Take 1 tablet (20 mg total) by mouth daily. 25 tablet 0    60 mg for 5 days, 40 mg for 5 days, 20 mg for 5 da ...  . PREDNISONE 20 MG PO TABS Oral Take 1 tablet (20 mg total) by mouth daily. 25 tablet 0    BP 131/83  Pulse 93  Temp 98.8 F (37.1 C)  Resp 16  SpO2 100%  Physical Exam  Nursing note and vitals reviewed. Constitutional: He is oriented to person, place, and time. He appears well-developed and well-nourished.  HENT:  Head: Normocephalic and atraumatic.  Eyes: Conjunctivae and EOM are normal. Pupils are equal, round, and reactive to light.  Neck: Normal range of motion. Neck supple.  Cardiovascular: Normal rate and regular rhythm.   Pulmonary/Chest: Effort normal and breath sounds normal.  Abdominal: Soft. Bowel sounds are normal.       Minimal  lower abdominal tenderness.  Musculoskeletal: Normal range of motion.  Neurological: He is alert and oriented to person, place, and time.  Skin: Skin is warm and dry.  Psychiatric: He has a normal mood and affect.    ED Course  Procedures (including critical care time)  Labs Reviewed  COMPREHENSIVE METABOLIC PANEL - Abnormal; Notable for the following:    Glucose, Bld 101 (*)     GFR calc non Af Amer 70 (*)     GFR calc Af Amer 81 (*)     All other components within normal limits  URINALYSIS, ROUTINE W REFLEX MICROSCOPIC - Abnormal; Notable for the following:    APPearance CLOUDY (*)     Leukocytes, UA SMALL (*)     All other components within normal limits  URINE MICROSCOPIC-ADD ON - Abnormal; Notable for the following:    Squamous Epithelial / LPF MANY (*)     All other components within normal limits  CBC WITH  DIFFERENTIAL   Dg Abd Acute W/chest  12/25/2011  *RADIOLOGY REPORT*  Clinical Data: 51 year old male with severe abdominal pain and nausea.  History of ulcerative colitis.  ACUTE ABDOMEN SERIES (ABDOMEN 2 VIEW & CHEST 1 VIEW)  Comparison: 10/27/2011 and earlier.  Findings: Improved lung volumes.  No pneumothorax or pneumoperitoneum.  Cardiac size and mediastinal contours are within normal limits.  Except for mild retrocardiac streaky opacity which most resembles atelectasis the lungs are clear.  Stable bowel gas pattern in this patient status post subtotal colectomy with reanastomosis. Unchanged gas filled residual large bowel segment in the mid abdomen.  No dilated small bowel loops. Postoperative changes to the mid abdomen pelvis again noted. No acute osseous abnormality identified.  IMPRESSION: 1.  Stable and nonobstructed bowel gas pattern in this patient who is status post sub total colectomy with reanastomosis.  No free air. 2.  Mild retrocardiac atelectasis.  Original Report Authenticated By: Harley Hallmark, M.D.     1. Ulcerative colitis       MDM  Patient feeling better after IV fluids and pain medication. No acute abdomen. X-ray shows no free air. Discharged on Percocet, Zofran, prednisone for 2 weeks         Donnetta Hutching, MD 12/25/11 2127

## 2011-12-25 NOTE — ED Notes (Signed)
C/O Abd. Pain, nausea, diarrhea x 2 days. Hx of ulcerative colitis

## 2012-01-02 ENCOUNTER — Emergency Department (HOSPITAL_COMMUNITY)
Admission: EM | Admit: 2012-01-02 | Discharge: 2012-01-02 | Disposition: A | Discharge status: Home / Self Care | Payer: Self-pay | Attending: Emergency Medicine | Admitting: Emergency Medicine

## 2012-01-02 ENCOUNTER — Encounter (HOSPITAL_COMMUNITY): Payer: Self-pay | Admitting: *Deleted

## 2012-01-02 DIAGNOSIS — I10 Essential (primary) hypertension: Secondary | ICD-10-CM | POA: Insufficient documentation

## 2012-01-02 DIAGNOSIS — K219 Gastro-esophageal reflux disease without esophagitis: Secondary | ICD-10-CM | POA: Insufficient documentation

## 2012-01-02 DIAGNOSIS — Z8739 Personal history of other diseases of the musculoskeletal system and connective tissue: Secondary | ICD-10-CM | POA: Insufficient documentation

## 2012-01-02 DIAGNOSIS — E119 Type 2 diabetes mellitus without complications: Secondary | ICD-10-CM | POA: Insufficient documentation

## 2012-01-02 DIAGNOSIS — R109 Unspecified abdominal pain: Secondary | ICD-10-CM | POA: Insufficient documentation

## 2012-01-02 DIAGNOSIS — Z79899 Other long term (current) drug therapy: Secondary | ICD-10-CM | POA: Insufficient documentation

## 2012-01-02 DIAGNOSIS — Z9089 Acquired absence of other organs: Secondary | ICD-10-CM | POA: Insufficient documentation

## 2012-01-02 DIAGNOSIS — E785 Hyperlipidemia, unspecified: Secondary | ICD-10-CM | POA: Insufficient documentation

## 2012-01-02 LAB — COMPREHENSIVE METABOLIC PANEL
BUN: 13 mg/dL (ref 6–23)
CO2: 25 mEq/L (ref 19–32)
Chloride: 105 mEq/L (ref 96–112)
Creatinine, Ser: 1.22 mg/dL (ref 0.50–1.35)
GFR calc Af Amer: 79 mL/min — ABNORMAL LOW (ref >90)
GFR calc non Af Amer: 69 mL/min — ABNORMAL LOW (ref >90)
Glucose, Bld: 95 mg/dL (ref 70–99)
Total Bilirubin: 0.7 mg/dL (ref 0.3–1.2)

## 2012-01-02 LAB — CBC WITH DIFFERENTIAL/PLATELET
Basophils Relative: 1 % (ref 0–1)
HCT: 41.2 % (ref 39.0–52.0)
Hemoglobin: 13.5 g/dL (ref 13.0–17.0)
Lymphocytes Relative: 30 % (ref 12–46)
MCHC: 32.8 g/dL (ref 30.0–36.0)
Monocytes Absolute: 0.3 10*3/uL (ref 0.1–1.0)
Monocytes Relative: 7 % (ref 3–12)
Neutro Abs: 2.5 10*3/uL (ref 1.7–7.7)

## 2012-01-02 LAB — URINALYSIS, ROUTINE W REFLEX MICROSCOPIC
Ketones, ur: NEGATIVE mg/dL
Leukocytes, UA: NEGATIVE
Nitrite: NEGATIVE
Protein, ur: NEGATIVE mg/dL

## 2012-01-02 LAB — LIPASE, BLOOD: Lipase: 35 U/L (ref 11–59)

## 2012-01-02 MED ORDER — PREDNISONE 10 MG PO TABS: 20.0000 mg | ORAL_TABLET | Freq: Every day | ORAL | Status: DC

## 2012-01-02 MED ORDER — OXYCODONE-ACETAMINOPHEN 5-325 MG PO TABS: 2.0000 | ORAL_TABLET | ORAL | Status: AC | PRN

## 2012-01-02 MED ORDER — HYDROMORPHONE HCL PF 1 MG/ML IJ SOLN
1.0000 mg | Freq: Once | INTRAMUSCULAR | Status: AC
  Administered 2012-01-02: 1 mg via INTRAVENOUS
  Filled 2012-01-02: qty 1

## 2012-01-02 MED ORDER — SODIUM CHLORIDE 0.9 % IV BOLUS (SEPSIS)
1000.0000 mL | Freq: Once | INTRAVENOUS | Status: AC
  Administered 2012-01-02 (×2): 1000 mL via INTRAVENOUS

## 2012-01-02 MED ORDER — PROMETHAZINE HCL 25 MG PO TABS: 25.0000 mg | ORAL_TABLET | Freq: Four times a day (QID) | ORAL | Status: DC | PRN

## 2012-01-02 MED ORDER — ONDANSETRON HCL 4 MG/2ML IJ SOLN
4.0000 mg | Freq: Once | INTRAMUSCULAR | Status: AC
  Administered 2012-01-02: 4 mg via INTRAVENOUS
  Filled 2012-01-02: qty 2

## 2012-01-02 MED ORDER — SODIUM CHLORIDE 0.9 % IV BOLUS (SEPSIS)
1000.0000 mL | Freq: Once | INTRAVENOUS | Status: AC
  Administered 2012-01-02: 1000 mL via INTRAVENOUS

## 2012-01-02 NOTE — ED Notes (Signed)
Patient given urinal.

## 2012-01-02 NOTE — ED Provider Notes (Signed)
History     CSN: [id number]  Arrival date & time 01/02/12  1621   First MD Initiated Contact with Patient 01/02/12 1731      Chief Complaint  Patient presents with  . Abdominal Pain    (Consider location/radiation/quality/duration/timing/severity/associated sxs/prior treatment) HPI Comments: Patient presents with abdominal pain that he states feels like an exacerbation of his ulcerative colitis. He states she's had ulcerative colitis for about 12 years and has intermittent flareups. He describes an abdominal bloating as well as a constant burning feeling throughout his abdomen particularly in the right lower abdomen. He's had some nausea and vomiting some intermittent diarrhea. He has some intermittent bloody stools which she also says is similar to his past ulcerative colitis flareups. He had a little bit more blood than usual yesterday but denies any blood in his stools today. Denies any fevers or chills. Denies any urinary symptoms. Denies any dizziness. He was given a prescription for prednisone which she typically takes for his ulcerative colitis flareups but states he hasn't been able to keep his medications down including the Percocet and he was not able to get a Zofran filled given that it was too expensive  Patient is a 51 y.o. male presenting with abdominal pain.  Abdominal Pain The primary symptoms of the illness include abdominal pain, nausea, vomiting and diarrhea. The primary symptoms of the illness do not include fever, fatigue or shortness of breath.  Symptoms associated with the illness do not include chills, diaphoresis, hematuria, frequency or back pain.    Past Medical History  Diagnosis Date  . Hypertension   . Inguinal hernia   . Hyperlipidemia   . Ulcer     ulcerative colitis  . Diabetes mellitus   . GERD (gastroesophageal reflux disease)   . Arthritis   . Ulcerative colitis     Past Surgical History  Procedure Date  . Colectomy   . Ileoanal anastomosis     . Appendectomy     Family History  Problem Relation Age of Onset  . Cancer Sister     History  Substance Use Topics  . Smoking status: Never Smoker   . Smokeless tobacco: Never Used  . Alcohol Use: 2.4 oz/week    4 Cans of beer per week      Review of Systems  Constitutional: Negative for fever, chills, diaphoresis and fatigue.  HENT: Negative for congestion, rhinorrhea and sneezing.   Eyes: Negative.   Respiratory: Negative for cough, chest tightness and shortness of breath.   Cardiovascular: Negative for chest pain and leg swelling.  Gastrointestinal: Positive for nausea, vomiting, abdominal pain, diarrhea, blood in stool and abdominal distention.  Genitourinary: Negative for frequency, hematuria, flank pain and difficulty urinating.  Musculoskeletal: Negative for back pain and arthralgias.  Skin: Negative for rash.  Neurological: Negative for dizziness, speech difficulty, weakness, numbness and headaches.    Allergies  Asacol; Penicillins; Sulfa antibiotics; and Iodinated diagnostic agents  Home Medications   Current Outpatient Rx  Name Route Sig Dispense Refill  . ACETAMINOPHEN 500 MG PO TABS Oral Take 1,000 mg by mouth every 6 (six) hours as needed. pain    . ATENOLOL 25 MG PO TABS Oral Take 25 mg by mouth daily.      Marland Kitchen CIPROFLOXACIN HCL 500 MG PO TABS Oral Take 500 mg by mouth 2 (two) times daily. Patient takes this on a regular basis has been on it for years uses it as an anit inflammatory    . FAMOTIDINE 20  MG PO TABS Oral Take 20 mg by mouth 2 (two) times daily.    Marland Kitchen LOPERAMIDE HCL 2 MG PO CAPS Oral Take 4 mg by mouth 3 (three) times daily.      . ADULT MULTIVITAMIN W/MINERALS CH Oral Take 1 tablet by mouth daily.    . OXYCODONE-ACETAMINOPHEN 5-325 MG PO TABS Oral Take 1-2 tablets by mouth every 6 (six) hours as needed for pain. 30 tablet 0  . PREDNISONE 20 MG PO TABS Oral Take 1 tablet (20 mg total) by mouth daily. 25 tablet 0    60 mg for 5 days, 40 mg for 5  days, 20 mg for 5 da ...  . ONDANSETRON HCL 8 MG PO TABS Oral Take 1 tablet (8 mg total) by mouth every 4 (four) hours as needed for nausea. 10 tablet 0  . OXYCODONE-ACETAMINOPHEN 5-325 MG PO TABS Oral Take 2 tablets by mouth every 4 (four) hours as needed for pain. 15 tablet 0  . PREDNISONE 10 MG PO TABS Oral Take 2 tablets (20 mg total) by mouth daily. 10 tablet 0  . PROMETHAZINE HCL 25 MG PO TABS Oral Take 1 tablet (25 mg total) by mouth every 8 (eight) hours as needed for nausea. 20 tablet 0  . PROMETHAZINE HCL 25 MG PO TABS Oral Take 1 tablet (25 mg total) by mouth every 6 (six) hours as needed for nausea. 30 tablet 0    BP 141/85  Pulse 66  Temp 98.7 F (37.1 C) (Oral)  Resp 18  SpO2 100%  Physical Exam  Constitutional: He is oriented to person, place, and time. He appears well-developed and well-nourished.  HENT:  Head: Normocephalic and atraumatic.  Mouth/Throat: Oropharynx is clear and moist.  Eyes: Pupils are equal, round, and reactive to light.  Neck: Normal range of motion. Neck supple.  Cardiovascular: Normal rate, regular rhythm and normal heart sounds.   Pulmonary/Chest: Effort normal and breath sounds normal. No respiratory distress. He has no wheezes. He has no rales. He exhibits no tenderness.  Abdominal: Soft. Bowel sounds are normal. There is tenderness (Mild diffuse tenderness). There is no rebound and no guarding.  Musculoskeletal: Normal range of motion. He exhibits no edema.  Lymphadenopathy:    He has no cervical adenopathy.  Neurological: He is alert and oriented to person, place, and time.  Skin: Skin is warm and dry. No rash noted.  Psychiatric: He has a normal mood and affect.    ED Course  Procedures (including critical care time)  Results for orders placed during the hospital encounter of 01/02/12  CBC WITH DIFFERENTIAL      Component Value Range   WBC 4.1  4.0 - 10.5 K/uL   RBC 4.47  4.22 - 5.81 MIL/uL   Hemoglobin 13.5  13.0 - 17.0 g/dL   HCT  29.5  62.1 - 30.8 %   MCV 92.2  78.0 - 100.0 fL   MCH 30.2  26.0 - 34.0 pg   MCHC 32.8  30.0 - 36.0 g/dL   RDW 65.7  84.6 - 96.2 %   Platelets 170  150 - 400 K/uL   Neutrophils Relative 61  43 - 77 %   Neutro Abs 2.5  1.7 - 7.7 K/uL   Lymphocytes Relative 30  12 - 46 %   Lymphs Abs 1.2  0.7 - 4.0 K/uL   Monocytes Relative 7  3 - 12 %   Monocytes Absolute 0.3  0.1 - 1.0 K/uL   Eosinophils Relative  3  0 - 5 %   Eosinophils Absolute 0.1  0.0 - 0.7 K/uL   Basophils Relative 1  0 - 1 %   Basophils Absolute 0.0  0.0 - 0.1 K/uL  COMPREHENSIVE METABOLIC PANEL      Component Value Range   Sodium 140  135 - 145 mEq/L   Potassium 4.3  3.5 - 5.1 mEq/L   Chloride 105  96 - 112 mEq/L   CO2 25  19 - 32 mEq/L   Glucose, Bld 95  70 - 99 mg/dL   BUN 13  6 - 23 mg/dL   Creatinine, Ser 1.61  0.50 - 1.35 mg/dL   Calcium 9.7  8.4 - 09.6 mg/dL   Total Protein 7.5  6.0 - 8.3 g/dL   Albumin 3.8  3.5 - 5.2 g/dL   AST 18  0 - 37 U/L   ALT 10  0 - 53 U/L   Alkaline Phosphatase 72  39 - 117 U/L   Total Bilirubin 0.7  0.3 - 1.2 mg/dL   GFR calc non Af Amer 69 (*) >90 mL/min   GFR calc Af Amer 79 (*) >90 mL/min  LIPASE, BLOOD      Component Value Range   Lipase 35  11 - 59 U/L  URINALYSIS, ROUTINE W REFLEX MICROSCOPIC      Component Value Range   Color, Urine YELLOW  YELLOW   APPearance CLOUDY (*) CLEAR   Specific Gravity, Urine 1.023  1.005 - 1.030   pH 6.0  5.0 - 8.0   Glucose, UA NEGATIVE  NEGATIVE mg/dL   Hgb urine dipstick NEGATIVE  NEGATIVE   Bilirubin Urine NEGATIVE  NEGATIVE   Ketones, ur NEGATIVE  NEGATIVE mg/dL   Protein, ur NEGATIVE  NEGATIVE mg/dL   Urobilinogen, UA 0.2  0.0 - 1.0 mg/dL   Nitrite NEGATIVE  NEGATIVE   Leukocytes, UA NEGATIVE  NEGATIVE      1. Abdominal pain       MDM  She was given IV fluids, pain medicines, nausea medicines. He's feeling much better here his abdominal pain has resolved. Do not feel that further imaging is indicated as his pain is consistent  with his past flareups. We'll prescribe him Phenergan and one more course of pain medicines and five-day course of prednisone        Rolan Bucco, MD 01/02/12 2118

## 2012-01-02 NOTE — ED Notes (Signed)
Pt c/o abd pain states was seen here last Friday for same, has hx of Ulcerative Colitis, states percocet causing n/v even with eating. Pt also reports blood in stools more than with usual flare.

## 2012-01-02 NOTE — ED Notes (Signed)
Urinals given pt unable to void at this time

## 2012-01-29 ENCOUNTER — Emergency Department (HOSPITAL_COMMUNITY): Payer: Self-pay

## 2012-01-29 ENCOUNTER — Encounter (HOSPITAL_COMMUNITY): Payer: Self-pay | Admitting: Emergency Medicine

## 2012-01-29 ENCOUNTER — Emergency Department (HOSPITAL_COMMUNITY)
Admission: EM | Admit: 2012-01-29 | Discharge: 2012-01-29 | Disposition: A | Discharge status: Home / Self Care | Payer: Self-pay | Attending: Emergency Medicine | Admitting: Emergency Medicine

## 2012-01-29 DIAGNOSIS — R197 Diarrhea, unspecified: Secondary | ICD-10-CM | POA: Insufficient documentation

## 2012-01-29 DIAGNOSIS — R109 Unspecified abdominal pain: Secondary | ICD-10-CM | POA: Insufficient documentation

## 2012-01-29 DIAGNOSIS — K5190 Ulcerative colitis, unspecified, without complications: Secondary | ICD-10-CM | POA: Insufficient documentation

## 2012-01-29 DIAGNOSIS — E119 Type 2 diabetes mellitus without complications: Secondary | ICD-10-CM | POA: Insufficient documentation

## 2012-01-29 DIAGNOSIS — I10 Essential (primary) hypertension: Secondary | ICD-10-CM | POA: Insufficient documentation

## 2012-01-29 DIAGNOSIS — E785 Hyperlipidemia, unspecified: Secondary | ICD-10-CM | POA: Insufficient documentation

## 2012-01-29 DIAGNOSIS — Z79899 Other long term (current) drug therapy: Secondary | ICD-10-CM | POA: Insufficient documentation

## 2012-01-29 DIAGNOSIS — K219 Gastro-esophageal reflux disease without esophagitis: Secondary | ICD-10-CM | POA: Insufficient documentation

## 2012-01-29 LAB — COMPREHENSIVE METABOLIC PANEL
ALT: 11 U/L (ref 0–53)
AST: 23 U/L (ref 0–37)
CO2: 26 mEq/L (ref 19–32)
Chloride: 103 mEq/L (ref 96–112)
Creatinine, Ser: 1.21 mg/dL (ref 0.50–1.35)
GFR calc non Af Amer: 69 mL/min — ABNORMAL LOW (ref >90)
Total Bilirubin: 0.4 mg/dL (ref 0.3–1.2)

## 2012-01-29 LAB — CBC WITH DIFFERENTIAL/PLATELET
Basophils Absolute: 0 10*3/uL (ref 0.0–0.1)
Basophils Relative: 1 % (ref 0–1)
Eosinophils Absolute: 0.1 10*3/uL (ref 0.0–0.7)
MCH: 30.5 pg (ref 26.0–34.0)
MCHC: 33.1 g/dL (ref 30.0–36.0)
Neutrophils Relative %: 64 % (ref 43–77)
Platelets: 179 10*3/uL (ref 150–400)
RBC: 4.56 MIL/uL (ref 4.22–5.81)
RDW: 14.5 % (ref 11.5–15.5)

## 2012-01-29 LAB — URINALYSIS, ROUTINE W REFLEX MICROSCOPIC
Leukocytes, UA: NEGATIVE
Protein, ur: NEGATIVE mg/dL
Urobilinogen, UA: 0.2 mg/dL (ref 0.0–1.0)

## 2012-01-29 MED ORDER — HYDROMORPHONE HCL PF 1 MG/ML IJ SOLN
1.0000 mg | Freq: Once | INTRAMUSCULAR | Status: AC
  Administered 2012-01-29: 1 mg via INTRAVENOUS
  Filled 2012-01-29: qty 1

## 2012-01-29 MED ORDER — SODIUM CHLORIDE 0.9 % IV BOLUS (SEPSIS)
1000.0000 mL | Freq: Once | INTRAVENOUS | Status: AC
  Administered 2012-01-29: 1000 mL via INTRAVENOUS

## 2012-01-29 MED ORDER — SODIUM CHLORIDE 0.9 % IV SOLN
INTRAVENOUS | Status: DC
  Administered 2012-01-29: 18:00:00 via INTRAVENOUS

## 2012-01-29 MED ORDER — OXYCODONE-ACETAMINOPHEN 5-325 MG PO TABS: 2.0000 | ORAL_TABLET | ORAL | Status: DC | PRN

## 2012-01-29 MED ORDER — METHYLPREDNISOLONE SODIUM SUCC 125 MG IJ SOLR
125.0000 mg | Freq: Once | INTRAMUSCULAR | Status: AC
  Administered 2012-01-29: 125 mg via INTRAVENOUS
  Filled 2012-01-29: qty 2

## 2012-01-29 MED ORDER — ONDANSETRON HCL 4 MG/2ML IJ SOLN
4.0000 mg | Freq: Once | INTRAMUSCULAR | Status: AC
  Administered 2012-01-29: 4 mg via INTRAVENOUS
  Filled 2012-01-29: qty 2

## 2012-01-29 MED ORDER — ONDANSETRON HCL 4 MG/2ML IJ SOLN: 4.0000 mg | Freq: Once | INTRAMUSCULAR | Status: DC

## 2012-01-29 MED ORDER — PREDNISONE 10 MG PO TABS: 20.0000 mg | ORAL_TABLET | Freq: Every day | ORAL | Status: DC

## 2012-01-29 MED ORDER — SODIUM CHLORIDE 0.9 % IV SOLN: 1000.0000 mL | INTRAVENOUS | Status: DC

## 2012-01-29 MED ORDER — IOHEXOL 300 MG/ML  SOLN
80.0000 mL | Freq: Once | INTRAMUSCULAR | Status: AC | PRN
  Administered 2012-01-29: 80 mL via INTRAVENOUS

## 2012-01-29 NOTE — ED Notes (Signed)
MD at bedside. Dr. Allen at bedside.  

## 2012-01-29 NOTE — ED Notes (Signed)
Patient transported to CT 

## 2012-01-29 NOTE — ED Provider Notes (Signed)
History     CSN: [id number]  Arrival date & time 01/29/12  1447   First MD Initiated Contact with Patient 01/29/12 1607      Chief Complaint  Patient presents with  . Abdominal Pain  . Diarrhea  . Dysuria    (Consider location/radiation/quality/duration/timing/severity/associated sxs/prior treatment) The history is provided by the patient.   Patient here with abdominal pain and feels like his prior exacerbation of ulcerative colitis. He is stated that he has drainage from a small area of his right lower quadrant. No fever, vomiting. Some nausea. No bloody stools. Has been seen multiple times in the past for this and usually can go home after having IV fluids, steroids, pain medications. Does have a gastroenterologist whom he sees. Has used his home medications without relief. Past Medical History  Diagnosis Date  . Hypertension   . Inguinal hernia   . Hyperlipidemia   . Ulcer     ulcerative colitis  . Diabetes mellitus   . GERD (gastroesophageal reflux disease)   . Arthritis   . Ulcerative colitis     Past Surgical History  Procedure Date  . Colectomy   . Ileoanal anastomosis   . Appendectomy     Family History  Problem Relation Age of Onset  . Cancer Sister     History  Substance Use Topics  . Smoking status: Never Smoker   . Smokeless tobacco: Never Used  . Alcohol Use: 2.4 oz/week    4 Cans of beer per week      Review of Systems  All other systems reviewed and are negative.    Allergies  Asacol; Penicillins; Sulfa antibiotics; and Iodinated diagnostic agents  Home Medications   Current Outpatient Rx  Name Route Sig Dispense Refill  . ACETAMINOPHEN 500 MG PO TABS Oral Take 1,000 mg by mouth every 6 (six) hours as needed. pain    . CIPROFLOXACIN HCL 500 MG PO TABS Oral Take 500 mg by mouth 2 (two) times daily. Patient takes this on a regular basis has been on it for years uses it as an anit inflammatory    . FAMOTIDINE 20 MG PO TABS Oral Take 20  mg by mouth 2 (two) times daily.    Marland Kitchen LOPERAMIDE HCL 2 MG PO CAPS Oral Take 4 mg by mouth 3 (three) times daily.      Marland Kitchen METOPROLOL TARTRATE 25 MG PO TABS Oral Take 25 mg by mouth daily.    . ADULT MULTIVITAMIN W/MINERALS CH Oral Take 1 tablet by mouth daily.    Marland Kitchen PREDNISONE 10 MG PO TABS Oral Take 2 tablets (20 mg total) by mouth daily. 10 tablet 0    BP 136/85  Pulse 70  Temp 98.8 F (37.1 C) (Oral)  Resp 17  SpO2 100%  Physical Exam  Nursing note and vitals reviewed. Constitutional: He is oriented to person, place, and time. He appears well-developed and well-nourished.  Non-toxic appearance. No distress.  HENT:  Head: Normocephalic and atraumatic.  Eyes: Conjunctivae, EOM and lids are normal. Pupils are equal, round, and reactive to light.  Neck: Normal range of motion. Neck supple. No tracheal deviation present. No mass present.  Cardiovascular: Normal rate, regular rhythm and normal heart sounds.  Exam reveals no gallop.   No murmur heard. Pulmonary/Chest: Effort normal and breath sounds normal. No stridor. No respiratory distress. He has no decreased breath sounds. He has no wheezes. He has no rhonchi. He has no rales.  Abdominal: Soft. Normal appearance and  bowel sounds are normal. He exhibits no distension. There is no tenderness. There is no rebound and no CVA tenderness.       Small draining area at right lq without erythema--drainage has been yellow  Musculoskeletal: Normal range of motion. He exhibits no edema and no tenderness.  Neurological: He is alert and oriented to person, place, and time. He has normal strength. No cranial nerve deficit or sensory deficit. GCS eye subscore is 4. GCS verbal subscore is 5. GCS motor subscore is 6.  Skin: Skin is warm and dry. No abrasion and no rash noted.  Psychiatric: He has a normal mood and affect. His speech is normal and behavior is normal.    ED Course  Procedures (including critical care time)   Labs Reviewed    COMPREHENSIVE METABOLIC PANEL  CBC WITH DIFFERENTIAL  URINALYSIS, ROUTINE W REFLEX MICROSCOPIC   No results found.   No diagnosis found.    MDM  Pt given pain meds and steroids, feels better, abd ct neg, pts abd rechecked and benign--stable for d/c        Leota Jacobsen, MD 01/29/12 2019

## 2012-01-29 NOTE — ED Notes (Signed)
Pt states he has some pus drainage from an old surgical scar to his R lower abd. Pt has a drsg on wound. Small amount of yellow pus drainage noted.

## 2012-01-29 NOTE — ED Notes (Signed)
Pt presenting to ed with c/o abdominal pain pt states feels like a possible colitis flare. Pt states he noticed yellowish drainage from his old incision site pt states it looks like the site has opened back up. Pt also states positive diarrhea with small amount of blood. Pt states positive nausea no vomiting. Pt states dysuria and difficulty urinating

## 2012-02-06 ENCOUNTER — Emergency Department (HOSPITAL_COMMUNITY)
Admission: EM | Admit: 2012-02-06 | Discharge: 2012-02-06 | Disposition: A | Discharge status: Home / Self Care | Payer: Self-pay | Attending: Emergency Medicine | Admitting: Emergency Medicine

## 2012-02-06 ENCOUNTER — Telehealth (INDEPENDENT_AMBULATORY_CARE_PROVIDER_SITE_OTHER): Payer: Self-pay | Admitting: General Surgery

## 2012-02-06 ENCOUNTER — Encounter (HOSPITAL_COMMUNITY): Payer: Self-pay | Admitting: Emergency Medicine

## 2012-02-06 ENCOUNTER — Emergency Department (HOSPITAL_COMMUNITY): Payer: Self-pay

## 2012-02-06 DIAGNOSIS — I10 Essential (primary) hypertension: Secondary | ICD-10-CM | POA: Insufficient documentation

## 2012-02-06 DIAGNOSIS — T148XXA Other injury of unspecified body region, initial encounter: Secondary | ICD-10-CM

## 2012-02-06 DIAGNOSIS — Z9089 Acquired absence of other organs: Secondary | ICD-10-CM | POA: Insufficient documentation

## 2012-02-06 DIAGNOSIS — E785 Hyperlipidemia, unspecified: Secondary | ICD-10-CM | POA: Insufficient documentation

## 2012-02-06 DIAGNOSIS — K219 Gastro-esophageal reflux disease without esophagitis: Secondary | ICD-10-CM | POA: Insufficient documentation

## 2012-02-06 DIAGNOSIS — L089 Local infection of the skin and subcutaneous tissue, unspecified: Secondary | ICD-10-CM | POA: Insufficient documentation

## 2012-02-06 DIAGNOSIS — Z8739 Personal history of other diseases of the musculoskeletal system and connective tissue: Secondary | ICD-10-CM | POA: Insufficient documentation

## 2012-02-06 DIAGNOSIS — E119 Type 2 diabetes mellitus without complications: Secondary | ICD-10-CM | POA: Insufficient documentation

## 2012-02-06 DIAGNOSIS — Z79899 Other long term (current) drug therapy: Secondary | ICD-10-CM | POA: Insufficient documentation

## 2012-02-06 LAB — CBC WITH DIFFERENTIAL/PLATELET
Basophils Absolute: 0 10*3/uL (ref 0.0–0.1)
Basophils Relative: 1 % (ref 0–1)
Eosinophils Relative: 1 % (ref 0–5)
Lymphocytes Relative: 34 % (ref 12–46)
MCHC: 33.3 g/dL (ref 30.0–36.0)
Neutro Abs: 2.9 10*3/uL (ref 1.7–7.7)
Platelets: 190 10*3/uL (ref 150–400)
RDW: 14.4 % (ref 11.5–15.5)
WBC: 5.1 10*3/uL (ref 4.0–10.5)

## 2012-02-06 LAB — BASIC METABOLIC PANEL WITH GFR
BUN: 9 mg/dL (ref 6–23)
CO2: 28 meq/L (ref 19–32)
Calcium: 10.2 mg/dL (ref 8.4–10.5)
Chloride: 101 meq/L (ref 96–112)
Creatinine, Ser: 1.12 mg/dL (ref 0.50–1.35)
GFR calc Af Amer: 88 mL/min — ABNORMAL LOW
GFR calc non Af Amer: 76 mL/min — ABNORMAL LOW
Glucose, Bld: 108 mg/dL — ABNORMAL HIGH (ref 70–99)
Potassium: 3.9 meq/L (ref 3.5–5.1)
Sodium: 138 meq/L (ref 135–145)

## 2012-02-06 MED ORDER — HYDROMORPHONE HCL PF 1 MG/ML IJ SOLN
1.0000 mg | Freq: Once | INTRAMUSCULAR | Status: AC
  Administered 2012-02-06: 1 mg via INTRAVENOUS
  Filled 2012-02-06: qty 1

## 2012-02-06 MED ORDER — CEPHALEXIN 500 MG PO CAPS: 500.0000 mg | ORAL_CAPSULE | Freq: Four times a day (QID) | ORAL | Status: AC

## 2012-02-06 MED ORDER — OXYCODONE-ACETAMINOPHEN 5-325 MG PO TABS: 1.0000 | ORAL_TABLET | Freq: Four times a day (QID) | ORAL | Status: AC | PRN

## 2012-02-06 NOTE — Telephone Encounter (Signed)
Dr. Effie Shy called to see if we could see this gentleman in our clinic next week for evaluation of a chronically draining abdominal wound.  He says that he is not toxic and he did not request surgery consult today.  I offered to come in to the ER to see the patient tonight but he said that was not necessary.  I explained that we do not have a system to guarantee that he is seen in the office by any particular date.  His best option would be to see if he could be seen in our urgent office on Monday.

## 2012-02-06 NOTE — ED Notes (Signed)
Pt reports having an object protruding from his abdomen.  Pt reports noticing this wound about 2 weeks ago.  States that he was seen here and was diagnosed with a surface wound.  Pt rates pain 10/10.  Wound tender to touch, swelling and drainage noticed.

## 2012-02-06 NOTE — ED Provider Notes (Signed)
Medical screening examination/treatment/procedure(s) were conducted as a shared visit with non-physician practitioner(s) and myself.  I personally evaluated the patient during the encounter He has hx of UC.  He c/o rlq pain and tenderness with a white cord like object coming from area where he previously had an abscess.   No fever but + chills and sweating.  Mild ttp over rlq.  No guarding. No signs infection.  Will check labs and do [place] to check for subq abscess.  Cheri Guppy, MD 02/06/12 [phone]

## 2012-02-06 NOTE — ED Provider Notes (Signed)
History     CSN: [id number]  Arrival date & time 02/06/12  1139   First MD Initiated Contact with Patient 02/06/12 1534      Chief Complaint  Patient presents with  . Wound Infection    (Consider location/radiation/quality/duration/timing/severity/associated sxs/prior treatment) HPI Comments: Matthew Francis 51 y.o. male   The chief complaint is: Patient presents with:   Wound Infection   The patient has medical history significant for:   Past Medical History:   Hypertension                                                 Inguinal hernia                                              Hyperlipidemia                                               Ulcer                                                          Comment:ulcerative colitis   Diabetes mellitus                                            GERD (gastroesophageal reflux disease)                       Arthritis                                                    Ulcerative colitis                                          Patient presents for wound infection that was evaluated 01/29/12. He states he had a boil on the right side on his body that burst last week. He was evaluated by Dr. Freida Busman who did a scan to rule out a deeper abdominal infection. Patient states that his wound has gotten worse and draining yellowish pus and blood. He also states that foreign body that appears like nylon string is coming out of the infection site. He states he has tried wound care and tylenol for his symptoms without improved. Associated symptoms chills, night sweats, nausea. Denies fever, vomiting, or diarrhea.       The history is provided by the patient.    Past Medical History  Diagnosis Date  . Hypertension   . Inguinal hernia   . Hyperlipidemia   . Ulcer     ulcerative colitis  .  Diabetes mellitus   . GERD (gastroesophageal reflux disease)   . Arthritis   . Ulcerative colitis     Past Surgical History  Procedure Date  .  Colectomy   . Ileoanal anastomosis   . Appendectomy     Family History  Problem Relation Age of Onset  . Cancer Sister     History  Substance Use Topics  . Smoking status: Never Smoker   . Smokeless tobacco: Never Used  . Alcohol Use: 2.4 oz/week    4 Cans of beer per week      Review of Systems  Constitutional: Positive for chills and diaphoresis. Negative for fever.  Gastrointestinal: Positive for nausea, vomiting and abdominal pain. Negative for diarrhea, constipation, blood in stool, abdominal distention and anal bleeding.  Skin: Positive for wound.    Allergies  Asacol; Penicillins; Sulfa antibiotics; and Iodinated diagnostic agents  Home Medications   Current Outpatient Rx  Name Route Sig Dispense Refill  . ACETAMINOPHEN 500 MG PO TABS Oral Take 1,000 mg by mouth every 6 (six) hours as needed. pain    . CIPROFLOXACIN HCL 500 MG PO TABS Oral Take 500 mg by mouth 2 (two) times daily. Patient takes this on a regular basis has been on it for years uses it as an anit inflammatory    . FAMOTIDINE 20 MG PO TABS Oral Take 20 mg by mouth 2 (two) times daily.    Marland Kitchen LOPERAMIDE HCL 2 MG PO CAPS Oral Take 4 mg by mouth 3 (three) times daily.      Marland Kitchen METOPROLOL TARTRATE 25 MG PO TABS Oral Take 25 mg by mouth daily.    . ADULT MULTIVITAMIN W/MINERALS CH Oral Take 1 tablet by mouth daily.    . OXYCODONE-ACETAMINOPHEN 5-325 MG PO TABS Oral Take 2 tablets by mouth every 4 (four) hours as needed for pain. 10 tablet 0  . PREDNISONE 10 MG PO TABS Oral Take 2 tablets (20 mg total) by mouth daily. 10 tablet 0  . PREDNISONE 10 MG PO TABS Oral Take 2 tablets (20 mg total) by mouth daily. 10 tablet 0    BP 118/83  Pulse 84  Temp 98.7 F (37.1 C) (Oral)  Resp 20  Ht 5\' 11"  (1.803 m)  SpO2 98%  Physical Exam  Nursing note and vitals reviewed. Constitutional: He appears well-developed and well-nourished. No distress.  HENT:  Head: Normocephalic and atraumatic.  Mouth/Throat:  Oropharynx is clear and moist.  Eyes: Conjunctivae and EOM are normal. No scleral icterus.  Neck: Normal range of motion. Neck supple.  Cardiovascular: Normal rate, regular rhythm and normal heart sounds.   Pulmonary/Chest: Effort normal and breath sounds normal.  Abdominal: Soft. Bowel sounds are normal. There is tenderness.    Lymphadenopathy:    He has no cervical adenopathy.  Neurological: He is alert.    ED Course  Procedures (including critical care time)   Labs Reviewed  CBC WITH DIFFERENTIAL  BASIC METABOLIC PANEL   Results for orders placed during the hospital encounter of 02/06/12  CBC WITH DIFFERENTIAL      Component Value Range   WBC 5.1  4.0 - 10.5 K/uL   RBC 4.78  4.22 - 5.81 MIL/uL   Hemoglobin 14.8  13.0 - 17.0 g/dL   HCT 40.9  81.1 - 91.4 %   MCV 92.9  78.0 - 100.0 fL   MCH 31.0  26.0 - 34.0 pg   MCHC 33.3  30.0 - 36.0 g/dL   RDW 78.2  11.5 - 15.5 %   Platelets 190  150 - 400 K/uL   Neutrophils Relative 58  43 - 77 %   Neutro Abs 2.9  1.7 - 7.7 K/uL   Lymphocytes Relative 34  12 - 46 %   Lymphs Abs 1.7  0.7 - 4.0 K/uL   Monocytes Relative 6  3 - 12 %   Monocytes Absolute 0.3  0.1 - 1.0 K/uL   Eosinophils Relative 1  0 - 5 %   Eosinophils Absolute 0.1  0.0 - 0.7 K/uL   Basophils Relative 1  0 - 1 %   Basophils Absolute 0.0  0.0 - 0.1 K/uL  BASIC METABOLIC PANEL      Component Value Range   Sodium 138  135 - 145 mEq/L   Potassium 3.9  3.5 - 5.1 mEq/L   Chloride 101  96 - 112 mEq/L   CO2 28  19 - 32 mEq/L   Glucose, Bld 108 (*) 70 - 99 mg/dL   BUN 9  6 - 23 mg/dL   Creatinine, Ser 4.09  0.50 - 1.35 mg/dL   Calcium 81.1  8.4 - 91.4 mg/dL   GFR calc non Af Amer 76 (*) >90 mL/min   GFR calc Af Amer 88 (*) >90 mL/min    US Pelvis Limited  02/06/2012  *RADIOLOGY REPORT*  Clinical Data: 51 year old male with history of numerous prior abdominal surgeries.  Draining area in the right lower quadrant, reportedly present at the time of CT scan 8 days ago  (01/29/2012).  US PELVIS LIMITED OR FOLLOW UP  Comparison: CT abdomen pelvis 01/29/2012 and earlier.  Findings: At the clinical area of yellowish fluid drainage there is a hypo echoic tract identified which extends through the right ventral abdominal wall.  Furthermore, there is evidence of a small plastic catheter (technologist reports this resembles a pediatric feeding tube).  Interestingly, no foreign body in this area was identified on the recent comparison.  There is a linear area in the right lower quadrant on that study which resembles scarring but may in fact represent a fistulous tract into the abdomen (right of midline series 2 images 54 through 59).  IMPRESSION: 1.  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 2.  Small catheter type tubing identified within the presumed fistulous tract, both at the bedside and by ultrasound (image 1). This can not be identified on the recent CT.  Study discussed by telephone with Dr. Mancel Bale on 02/06/2012 at 1715 hours.   Original Report Authenticated By: Harley Hallmark, M.D.      1. Wound infection       MDM  Patient presented with wound infection after being assessed in ED 01/29/12. CT abdomen pelvis done on this visit negative. Patient in pain 8/10.Pain and nausea medication given in the ED. Abdominal ultrasound ordered to rule out abscess. CBC, BMP: unremarkable. Ultrasound: suspicious for fistula secondary to surgical materials. Patient will follow-up with [facility] and see Dr. Kae Heller on Monday at 3:30 in urgent care. Patient will be discharged on percocet and ABX with instructions about wound care. No red flags for peritonitis or acute abdomen. Return precautions given in discharge summary.         Pixie Casino, PA-C 02/06/12 2018

## 2012-02-06 NOTE — ED Notes (Signed)
Pt reports still being in pain without any relief from Dilaudid.

## 2012-02-06 NOTE — Telephone Encounter (Signed)
I offered to have him come into our clinic Monday at 3pm for evaluation if the ER physician did not need [place] to see him tonight.

## 2012-02-06 NOTE — ED Notes (Signed)
Patient came to [facility] ED a week ago for an infected right-sided abdominal wound and colitis flare up.  Patient states wound has not improved and is now draining pus and blood.  Pt. Reports that there is a foreign object coming out of the wound.  Pt. Has been cleaning the infected site with alcohol, topical anti-biotic ointment and keeping the area covered with a bandaid.

## 2012-02-07 NOTE — ED Provider Notes (Signed)
Medical screening examination/treatment/procedure(s) were conducted as a shared visit with non-physician practitioner(s) and myself.  I personally evaluated the patient during the encounter  Auria Mckinlay, MD 02/07/12 0711 

## 2012-02-09 ENCOUNTER — Encounter (INDEPENDENT_AMBULATORY_CARE_PROVIDER_SITE_OTHER): Payer: Self-pay | Admitting: General Surgery

## 2012-02-09 ENCOUNTER — Ambulatory Visit (INDEPENDENT_AMBULATORY_CARE_PROVIDER_SITE_OTHER): Payer: Self-pay | Admitting: General Surgery

## 2012-02-09 ENCOUNTER — Telehealth (INDEPENDENT_AMBULATORY_CARE_PROVIDER_SITE_OTHER): Payer: Self-pay | Admitting: General Surgery

## 2012-02-09 VITALS — BP 148/88 | HR 82 | Temp 97.4°F | Resp 18 | Ht 71.0 in | Wt 169.4 lb

## 2012-02-09 DIAGNOSIS — M795 Residual foreign body in soft tissue: Secondary | ICD-10-CM

## 2012-02-09 HISTORY — DX: Residual foreign body in soft tissue: M79.5

## 2012-02-09 NOTE — Telephone Encounter (Signed)
Addendum to previous encounter...patient was ok with appt time of 3:00 02/09/12 and stated he would be here

## 2012-02-09 NOTE — Telephone Encounter (Signed)
Called patient to give him and appt time per staff message from dr.Layton to be seen this day for abd wound drainage

## 2012-02-09 NOTE — Progress Notes (Signed)
Patient ID: Matthew Francis, male   DOB: Oct 20, 1960, 51 y.o.   MRN: [id number]  Chief Complaint  Patient presents with  . Follow-up    reche abd wound/drainage    HPI Matthew Francis is a 51 y.o. male.   HPI  He is referred by Dr. Eulis Foster from the emergency department for evaluation of a right lower quadrant wound with a protruding foreign body. He has a very complex abdominal surgical history. He underwent a total abdominal colectomy with IPAA for ulcerative colitis many years ago. This was complicated by an incisional hernia. He has had multiple incisional hernia repairs. He had a severe infection from one of these hernia repairs and eventually had to have the prosthetic mesh extracted. It was replaced with biologic mesh and he did fairly well from that. He had a chronic draining right lower quadrant abdominal wound but eventually sealed but has reopened and is painful. The CT scan did not demonstrate any definite foreign body but it was visible at the bedside as well as on ultrasound.  Past Medical History  Diagnosis Date  . Hypertension   . Inguinal hernia   . Hyperlipidemia   . Ulcer     ulcerative colitis  . Diabetes mellitus   . GERD (gastroesophageal reflux disease)   . Arthritis   . Ulcerative colitis   . H/O ulcerative colitis     Past Surgical History  Procedure Date  . Ileoanal anastomosis   . Appendectomy   . Colectomy     Proctocolectomy with IPAA  . Colectomy   . Hernia repair     Multiple incisional hernias.    Family History  Problem Relation Age of Onset  . Cancer Sister     Social History History  Substance Use Topics  . Smoking status: Never Smoker   . Smokeless tobacco: Never Used  . Alcohol Use: 2.4 oz/week    4 Cans of beer per week    Allergies  Allergen Reactions  . Asacol (Mesalamine) Swelling    Throat   . Penicillins Swelling    In throat  . Sulfa Antibiotics Swelling    throat  . Iodinated Diagnostic Agents     [facility]. Rapid heartbeat and dyspnea    Current Outpatient Prescriptions  Medication Sig Dispense Refill  . acetaminophen (TYLENOL) 500 MG tablet Take 1,000 mg by mouth every 6 (six) hours as needed. pain      . cephALEXin (KEFLEX) 500 MG capsule Take 1 capsule (500 mg total) by mouth 4 (four) times daily.  28 capsule  0  . ciprofloxacin (CIPRO) 500 MG tablet Take 500 mg by mouth 2 (two) times daily. Patient takes this on a regular basis has been on it for years uses it as an anit inflammatory      . famotidine (PEPCID) 20 MG tablet Take 20 mg by mouth 2 (two) times daily.      Marland Kitchen loperamide (IMODIUM) 2 MG capsule Take 4 mg by mouth 3 (three) times daily.        . metoprolol tartrate (LOPRESSOR) 25 MG tablet Take 25 mg by mouth daily.      . Multiple Vitamin (MULTIVITAMIN WITH MINERALS) TABS Take 1 tablet by mouth daily.      Marland Kitchen oxyCODONE-acetaminophen (PERCOCET/ROXICET) 5-325 MG per tablet Take 1 tablet by mouth every 6 (six) hours as needed for pain.  15 tablet  0    Review of Systems Review of Systems  Constitutional: Negative for fever and chills.  Gastrointestinal: Positive for abdominal pain (rlq area) and diarrhea.    Blood pressure 148/88, pulse 82, temperature 97.4 F (36.3 C), temperature source Temporal, resp. rate 18, height 5' 11"  (1.803 m), weight 169 lb 6.4 oz (76.839 kg).  Physical Exam Physical Exam  Constitutional: He appears well-developed and well-nourished. No distress.  Cardiovascular: Normal rate and regular rhythm.   Pulmonary/Chest: Effort normal and breath sounds normal.  Abdominal: Soft.       Sunken in midline scar. Small right lower quadrant wound with whitish protruding foreign body it is not able to be removed at the bedside.  Genitourinary:       Moderate-sized left inguinal bulge that is reducible. Smaller right inguinal bulge.    Data Reviewed CT scan, ultrasound, emergency department visit note  Assessment    Chronic draining right lower quadrant  wound now with visible foreign body.    Plan    I recommended going to the operating room for an abdominal wound exploration and removal of foreign body and he is agreeable to this. We discussed the procedure and the risks. Risks include but limited to bleeding, infection, poor wound healing, need further operations, injury to intestine.       Hudson Lehmkuhl J 02/09/2012, 4:40 PM

## 2012-02-09 NOTE — Patient Instructions (Signed)
We will schedule your surgery as soon as possible.

## 2012-02-10 ENCOUNTER — Encounter (HOSPITAL_BASED_OUTPATIENT_CLINIC_OR_DEPARTMENT_OTHER): Payer: Self-pay | Admitting: Anesthesiology

## 2012-02-10 ENCOUNTER — Encounter (HOSPITAL_BASED_OUTPATIENT_CLINIC_OR_DEPARTMENT_OTHER): Payer: Self-pay | Admitting: *Deleted

## 2012-02-10 NOTE — Progress Notes (Signed)
Very nice young man with multiple abd surgeries for ulcerative colitis-chronic diarrhea. Cannot keep job due to this. Will come back for labs Had MRSA 2007-abd wound-treated-this wound cultured-not mrsa

## 2012-02-10 NOTE — Anesthesia Preprocedure Evaluation (Addendum)
Anesthesia Evaluation  Patient identified by MRN, date of birth, ID band Patient awake    Reviewed: Allergy & Precautions, H&P , NPO status , Patient's Chart, lab work & pertinent test results, reviewed documented beta blocker date and time   Airway Mallampati: I TM Distance: >3 FB Neck ROM: Full    Dental   Pulmonary neg pulmonary ROS,  breath sounds clear to auscultation  Pulmonary exam normal       Cardiovascular hypertension, On Medications and On Home Beta Blockers Rhythm:regular     Neuro/Psych negative neurological ROS  negative psych ROS   GI/Hepatic Neg liver ROS, PUD, GERD-  Medicated and Controlled,  Endo/Other  negative endocrine ROS  Renal/GU negative Renal ROS  negative genitourinary   Musculoskeletal   Abdominal   Peds  Hematology negative hematology ROS (+)   Anesthesia Other Findings See surgeon's H&P   Reproductive/Obstetrics negative OB ROS                          Anesthesia Physical Anesthesia Plan  ASA: II  Anesthesia Plan: General   Post-op Pain Management:    Induction: Intravenous  Airway Management Planned: LMA  Additional Equipment:   Intra-op Plan:   Post-operative Plan: Extubation in OR  Informed Consent: I have reviewed the patients History and Physical, chart, labs and discussed the procedure including the risks, benefits and alternatives for the proposed anesthesia with the patient or authorized representative who has indicated his/her understanding and acceptance.   Dental Advisory Given  Plan Discussed with: CRNA and Surgeon  Anesthesia Plan Comments:        Anesthesia Quick Evaluation

## 2012-02-11 ENCOUNTER — Telehealth (INDEPENDENT_AMBULATORY_CARE_PROVIDER_SITE_OTHER): Payer: Self-pay

## 2012-02-11 NOTE — Telephone Encounter (Signed)
Pt calling requesting refill of pain medication.  His surgery was cancelled and rescheduled for 02/17/12, and he is out of medicine.  Pls advise.

## 2012-02-12 ENCOUNTER — Telehealth (INDEPENDENT_AMBULATORY_CARE_PROVIDER_SITE_OTHER): Payer: Self-pay

## 2012-02-12 NOTE — Telephone Encounter (Signed)
Made patient aware Rx for Vicodin 5/325 #30 was called to [facility] [address].

## 2012-02-13 ENCOUNTER — Encounter (HOSPITAL_BASED_OUTPATIENT_CLINIC_OR_DEPARTMENT_OTHER): Payer: Self-pay | Admitting: *Deleted

## 2012-02-13 ENCOUNTER — Encounter (HOSPITAL_BASED_OUTPATIENT_CLINIC_OR_DEPARTMENT_OTHER)
Admission: RE | Admit: 2012-02-13 | Discharge: 2012-02-13 | Disposition: A | Discharge status: Home / Self Care | Payer: Self-pay | Source: Ambulatory Visit | Attending: General Surgery | Admitting: General Surgery

## 2012-02-13 ENCOUNTER — Other Ambulatory Visit: Payer: Self-pay

## 2012-02-13 LAB — BASIC METABOLIC PANEL
GFR calc Af Amer: 73 mL/min — ABNORMAL LOW (ref >90)
GFR calc non Af Amer: 63 mL/min — ABNORMAL LOW (ref >90)
Potassium: 4.3 mEq/L (ref 3.5–5.1)
Sodium: 138 mEq/L (ref 135–145)

## 2012-02-17 ENCOUNTER — Encounter (HOSPITAL_BASED_OUTPATIENT_CLINIC_OR_DEPARTMENT_OTHER): Payer: Self-pay | Admitting: Anesthesiology

## 2012-02-17 ENCOUNTER — Ambulatory Visit (HOSPITAL_BASED_OUTPATIENT_CLINIC_OR_DEPARTMENT_OTHER)
Admission: RE | Admit: 2012-02-17 | Discharge: 2012-02-17 | Disposition: A | Discharge status: Home / Self Care | Payer: Self-pay | Source: Ambulatory Visit | Attending: General Surgery | Admitting: General Surgery

## 2012-02-17 ENCOUNTER — Encounter (HOSPITAL_BASED_OUTPATIENT_CLINIC_OR_DEPARTMENT_OTHER)
Admission: RE | Disposition: A | Discharge status: Home / Self Care | Payer: Self-pay | Source: Ambulatory Visit | Attending: General Surgery

## 2012-02-17 ENCOUNTER — Other Ambulatory Visit (INDEPENDENT_AMBULATORY_CARE_PROVIDER_SITE_OTHER): Payer: Self-pay | Admitting: General Surgery

## 2012-02-17 ENCOUNTER — Encounter (HOSPITAL_BASED_OUTPATIENT_CLINIC_OR_DEPARTMENT_OTHER): Payer: Self-pay | Admitting: Certified Registered Nurse Anesthetist

## 2012-02-17 ENCOUNTER — Ambulatory Visit (HOSPITAL_BASED_OUTPATIENT_CLINIC_OR_DEPARTMENT_OTHER): Payer: Self-pay | Admitting: Anesthesiology

## 2012-02-17 ENCOUNTER — Encounter (HOSPITAL_BASED_OUTPATIENT_CLINIC_OR_DEPARTMENT_OTHER): Payer: Self-pay | Admitting: *Deleted

## 2012-02-17 DIAGNOSIS — I10 Essential (primary) hypertension: Secondary | ICD-10-CM | POA: Insufficient documentation

## 2012-02-17 DIAGNOSIS — M795 Residual foreign body in soft tissue: Secondary | ICD-10-CM

## 2012-02-17 DIAGNOSIS — K219 Gastro-esophageal reflux disease without esophagitis: Secondary | ICD-10-CM | POA: Insufficient documentation

## 2012-02-17 DIAGNOSIS — E119 Type 2 diabetes mellitus without complications: Secondary | ICD-10-CM | POA: Insufficient documentation

## 2012-02-17 DIAGNOSIS — Z9089 Acquired absence of other organs: Secondary | ICD-10-CM | POA: Insufficient documentation

## 2012-02-17 DIAGNOSIS — M129 Arthropathy, unspecified: Secondary | ICD-10-CM | POA: Insufficient documentation

## 2012-02-17 DIAGNOSIS — E785 Hyperlipidemia, unspecified: Secondary | ICD-10-CM | POA: Insufficient documentation

## 2012-02-17 HISTORY — PX: FOREIGN BODY REMOVAL ABDOMINAL: SHX5319

## 2012-02-17 HISTORY — DX: Presence of spectacles and contact lenses: Z97.3

## 2012-02-17 SURGERY — REMOVAL, FOREIGN BODY, ABDOMEN
Anesthesia: General | Site: Abdomen | Wound class: Contaminated

## 2012-02-17 MED ORDER — METOCLOPRAMIDE HCL 5 MG/ML IJ SOLN: 10.0000 mg | Freq: Once | INTRAMUSCULAR | Status: DC | PRN

## 2012-02-17 MED ORDER — HYDROMORPHONE HCL PF 1 MG/ML IJ SOLN
0.2500 mg | INTRAMUSCULAR | Status: DC | PRN
  Administered 2012-02-17 (×3): 0.5 mg via INTRAVENOUS

## 2012-02-17 MED ORDER — ONDANSETRON HCL 4 MG/2ML IJ SOLN
INTRAMUSCULAR | Status: DC | PRN
  Administered 2012-02-17: 4 mg via INTRAVENOUS

## 2012-02-17 MED ORDER — BUPIVACAINE HCL (PF) 0.5 % IJ SOLN
INTRAMUSCULAR | Status: DC | PRN
  Administered 2012-02-17: 10 mL

## 2012-02-17 MED ORDER — MIDAZOLAM HCL 2 MG/2ML IJ SOLN: 1.0000 mg | INTRAMUSCULAR | Status: DC | PRN

## 2012-02-17 MED ORDER — MIDAZOLAM HCL 5 MG/5ML IJ SOLN
INTRAMUSCULAR | Status: DC | PRN
  Administered 2012-02-17: 2 mg via INTRAVENOUS

## 2012-02-17 MED ORDER — OXYCODONE HCL 5 MG/5ML PO SOLN: 5.0000 mg | Freq: Once | ORAL | Status: AC | PRN

## 2012-02-17 MED ORDER — DEXAMETHASONE SODIUM PHOSPHATE 4 MG/ML IJ SOLN
INTRAMUSCULAR | Status: DC | PRN
  Administered 2012-02-17: 10 mg via INTRAVENOUS

## 2012-02-17 MED ORDER — VANCOMYCIN HCL IN DEXTROSE 1-5 GM/200ML-% IV SOLN
1000.0000 mg | INTRAVENOUS | Status: AC
  Administered 2012-02-17: 1000 mg via INTRAVENOUS

## 2012-02-17 MED ORDER — HYDROCODONE-ACETAMINOPHEN 5-325 MG PO TABS: 1.0000 | ORAL_TABLET | Freq: Four times a day (QID) | ORAL | Status: DC | PRN

## 2012-02-17 MED ORDER — HYDROCODONE-ACETAMINOPHEN 5-325 MG PO TABS: 1.0000 | ORAL_TABLET | Freq: Four times a day (QID) | ORAL | Status: AC | PRN

## 2012-02-17 MED ORDER — LACTATED RINGERS IV SOLN
INTRAVENOUS | Status: DC
  Administered 2012-02-17: 14:00:00 via INTRAVENOUS

## 2012-02-17 MED ORDER — OXYCODONE HCL 5 MG PO TABS
5.0000 mg | ORAL_TABLET | Freq: Once | ORAL | Status: AC | PRN
  Administered 2012-02-17: 5 mg via ORAL

## 2012-02-17 MED ORDER — FENTANYL CITRATE 0.05 MG/ML IJ SOLN: 50.0000 ug | INTRAMUSCULAR | Status: DC | PRN

## 2012-02-17 MED ORDER — PROPOFOL 10 MG/ML IV BOLUS
INTRAVENOUS | Status: DC | PRN
  Administered 2012-02-17: 20 mg via INTRAVENOUS

## 2012-02-17 MED ORDER — METOCLOPRAMIDE HCL 5 MG/ML IJ SOLN
INTRAMUSCULAR | Status: DC | PRN
  Administered 2012-02-17: 10 mg via INTRAVENOUS

## 2012-02-17 MED ORDER — FENTANYL CITRATE 0.05 MG/ML IJ SOLN
INTRAMUSCULAR | Status: DC | PRN
  Administered 2012-02-17 (×2): 50 ug via INTRAVENOUS

## 2012-02-17 SURGICAL SUPPLY — 41 items
APL SKNCLS STERI-STRIP NONHPOA (GAUZE/BANDAGES/DRESSINGS)
BENZOIN TINCTURE PRP APPL 2/3 (GAUZE/BANDAGES/DRESSINGS) IMPLANT
BLADE SURG 10 STRL SS (BLADE) ×2 IMPLANT
BLADE SURG ROTATE 9660 (MISCELLANEOUS) IMPLANT
CANISTER SUCTION 1200CC (MISCELLANEOUS) IMPLANT
CHLORAPREP W/TINT 26ML (MISCELLANEOUS) ×2 IMPLANT
CLEANER CAUTERY TIP 5X5 PAD (MISCELLANEOUS) ×1 IMPLANT
CLOTH BEACON ORANGE TIMEOUT ST (SAFETY) ×2 IMPLANT
COVER MAYO STAND STRL (DRAPES) ×2 IMPLANT
COVER TABLE BACK 60X90 (DRAPES) ×2 IMPLANT
DECANTER SPIKE VIAL GLASS SM (MISCELLANEOUS) IMPLANT
DRAPE PED LAPAROTOMY (DRAPES) ×2 IMPLANT
DRSG TEGADERM 4X4.75 (GAUZE/BANDAGES/DRESSINGS) IMPLANT
ELECT REM PT RETURN 9FT ADLT (ELECTROSURGICAL) ×2
ELECTRODE REM PT RTRN 9FT ADLT (ELECTROSURGICAL) ×1 IMPLANT
GAUZE SPONGE 4X4 12PLY STRL LF (GAUZE/BANDAGES/DRESSINGS) IMPLANT
GAUZE SPONGE 4X4 16PLY XRAY LF (GAUZE/BANDAGES/DRESSINGS) IMPLANT
GLOVE BIO SURGEON STRL SZ 6.5 (GLOVE) ×2 IMPLANT
GLOVE BIOGEL PI IND STRL 7.0 (GLOVE) IMPLANT
GLOVE BIOGEL PI IND STRL 8.5 (GLOVE) ×1 IMPLANT
GLOVE BIOGEL PI INDICATOR 7.0 (GLOVE) ×1
GLOVE BIOGEL PI INDICATOR 8.5 (GLOVE) ×1
GLOVE ECLIPSE 8.0 STRL XLNG CF (GLOVE) ×2 IMPLANT
GOWN PREVENTION PLUS XLARGE (GOWN DISPOSABLE) ×2 IMPLANT
NDL HYPO 25X1 1.5 SAFETY (NEEDLE) ×1 IMPLANT
NEEDLE HYPO 25X1 1.5 SAFETY (NEEDLE) ×2 IMPLANT
NS IRRIG 1000ML POUR BTL (IV SOLUTION) ×1 IMPLANT
PACK BASIN DAY SURGERY FS (CUSTOM PROCEDURE TRAY) ×2 IMPLANT
PAD CLEANER CAUTERY TIP 5X5 (MISCELLANEOUS) ×1
PENCIL BUTTON HOLSTER BLD 10FT (ELECTRODE) ×2 IMPLANT
STRIP CLOSURE SKIN 1/2X4 (GAUZE/BANDAGES/DRESSINGS) IMPLANT
SUT MON AB 4-0 PC3 18 (SUTURE) IMPLANT
SUT PROLENE 2 0 CT2 30 (SUTURE) IMPLANT
SUT VIC AB 4-0 SH 18 (SUTURE) IMPLANT
SUT VIC AB 4-0 SH 27 (SUTURE)
SUT VIC AB 4-0 SH 27XANBCTRL (SUTURE) IMPLANT
SYR CONTROL 10ML LL (SYRINGE) ×2 IMPLANT
TOWEL OR 17X24 6PK STRL BLUE (TOWEL DISPOSABLE) ×2 IMPLANT
TUBE CONNECTING 20X1/4 (TUBING) IMPLANT
WATER STERILE IRR 1000ML POUR (IV SOLUTION) IMPLANT
YANKAUER SUCT BULB TIP NO VENT (SUCTIONS) IMPLANT

## 2012-02-17 NOTE — Anesthesia Postprocedure Evaluation (Signed)
Anesthesia Post Note  Patient: Matthew Francis  Procedure(s) Performed: Procedure(s) (LRB): REMOVAL FOREIGN BODY ABDOMINAL (N/A)  Anesthesia type: General  Patient location: PACU  Post pain: Pain level controlled  Post assessment: Patient's Cardiovascular Status Stable  Last Vitals:  Filed Vitals:   02/17/12 1635  BP: 133/80  Pulse: 53  Temp: 36.7 C  Resp: 16    Post vital signs: Reviewed and stable  Level of consciousness: alert  Complications: No apparent anesthesia complications

## 2012-02-17 NOTE — Anesthesia Procedure Notes (Signed)
Procedure Name: LMA Insertion Date/Time: 02/17/2012 2:49 PM Performed by: Kay Shippy D Pre-anesthesia Checklist: Patient identified, Emergency Drugs available, Suction available and Patient being monitored Patient Re-evaluated:Patient Re-evaluated prior to inductionOxygen Delivery Method: Circle System Utilized Preoxygenation: Pre-oxygenation with 100% oxygen Intubation Type: IV induction Ventilation: Mask ventilation without difficulty LMA: LMA inserted LMA Size: 5.0 Number of attempts: 1 Airway Equipment and Method: bite block Placement Confirmation: positive ETCO2 Tube secured with: Tape Dental Injury: Teeth and Oropharynx as per pre-operative assessment

## 2012-02-17 NOTE — H&P (View-Only) (Signed)
Patient ID: Matthew Francis, male   DOB: December 02, 1960, 51 y.o.   MRN: [id number]  Chief Complaint  Patient presents with  . Follow-up    reche abd wound/drainage    HPI Matthew Francis is a 51 y.o. male.   HPI  He is referred by Dr. Eulis Foster from the emergency department for evaluation of a right lower quadrant wound with a protruding foreign body. He has a very complex abdominal surgical history. He underwent a total abdominal colectomy with IPAA for ulcerative colitis many years ago. This was complicated by an incisional hernia. He has had multiple incisional hernia repairs. He had a severe infection from one of these hernia repairs and eventually had to have the prosthetic mesh extracted. It was replaced with biologic mesh and he did fairly well from that. He had a chronic draining right lower quadrant abdominal wound but eventually sealed but has reopened and is painful. The CT scan did not demonstrate any definite foreign body but it was visible at the bedside as well as on ultrasound.  Past Medical History  Diagnosis Date  . Hypertension   . Inguinal hernia   . Hyperlipidemia   . Ulcer     ulcerative colitis  . Diabetes mellitus   . GERD (gastroesophageal reflux disease)   . Arthritis   . Ulcerative colitis   . H/O ulcerative colitis     Past Surgical History  Procedure Date  . Ileoanal anastomosis   . Appendectomy   . Colectomy     Proctocolectomy with IPAA  . Colectomy   . Hernia repair     Multiple incisional hernias.    Family History  Problem Relation Age of Onset  . Cancer Sister     Social History History  Substance Use Topics  . Smoking status: Never Smoker   . Smokeless tobacco: Never Used  . Alcohol Use: 2.4 oz/week    4 Cans of beer per week    Allergies  Allergen Reactions  . Asacol (Mesalamine) Swelling    Throat   . Penicillins Swelling    In throat  . Sulfa Antibiotics Swelling    throat  . Iodinated Diagnostic Agents     [facility] at [facility]. Rapid heartbeat and dyspnea    Current Outpatient Prescriptions  Medication Sig Dispense Refill  . acetaminophen (TYLENOL) 500 MG tablet Take 1,000 mg by mouth every 6 (six) hours as needed. pain      . cephALEXin (KEFLEX) 500 MG capsule Take 1 capsule (500 mg total) by mouth 4 (four) times daily.  28 capsule  0  . ciprofloxacin (CIPRO) 500 MG tablet Take 500 mg by mouth 2 (two) times daily. Patient takes this on a regular basis has been on it for years uses it as an anit inflammatory      . famotidine (PEPCID) 20 MG tablet Take 20 mg by mouth 2 (two) times daily.      Marland Kitchen loperamide (IMODIUM) 2 MG capsule Take 4 mg by mouth 3 (three) times daily.        . metoprolol tartrate (LOPRESSOR) 25 MG tablet Take 25 mg by mouth daily.      . Multiple Vitamin (MULTIVITAMIN WITH MINERALS) TABS Take 1 tablet by mouth daily.      Marland Kitchen oxyCODONE-acetaminophen (PERCOCET/ROXICET) 5-325 MG per tablet Take 1 tablet by mouth every 6 (six) hours as needed for pain.  15 tablet  0    Review of Systems Review of Systems  Constitutional: Negative for fever and chills.  Gastrointestinal: Positive for abdominal pain (rlq area) and diarrhea.    Blood pressure 148/88, pulse 82, temperature 97.4 F (36.3 C), temperature source Temporal, resp. rate 18, height 5' 11"  (1.803 m), weight 169 lb 6.4 oz (76.839 kg).  Physical Exam Physical Exam  Constitutional: He appears well-developed and well-nourished. No distress.  Cardiovascular: Normal rate and regular rhythm.   Pulmonary/Chest: Effort normal and breath sounds normal.  Abdominal: Soft.       Sunken in midline scar. Small right lower quadrant wound with whitish protruding foreign body it is not able to be removed at the bedside.  Genitourinary:       Moderate-sized left inguinal bulge that is reducible. Smaller right inguinal bulge.    Data Reviewed CT scan, ultrasound, emergency department visit note  Assessment    Chronic draining right lower quadrant  wound now with visible foreign body.    Plan    I recommended going to the operating room for an abdominal wound exploration and removal of foreign body and he is agreeable to this. We discussed the procedure and the risks. Risks include but limited to bleeding, infection, poor wound healing, need further operations, injury to intestine.       Naomee Nowland J 02/09/2012, 4:40 PM

## 2012-02-17 NOTE — Op Note (Signed)
Operative Note  Matthew Francis male 51 y.o. 02/17/2012  PREOPERATIVE DX:  Foreign body abdominal wall  POSTOPERATIVE DX:  Same (suture-?PTFE)  PROCEDURE:  Abdominal wall expiration and removal of foreign body         Surgeon: Odis Hollingshead   Assistants: Jiles Harold, PA-S  Anesthesia: General LMA anesthesia  Indications:   This is a 51 year old male which had multiple abdominal hernia repairs in the past. Some of the mesh as been removed in the past. He says a problem with a chronic small nonhealing wound in the right lower quadrant. Recently, he noticed white material extruding from the wound. When I saw him in the office it appeared he had a plastic-type foreign body that was protruding through the skin. It could not be removed in the office because he was so uncomfortable for him with manipulation. He is now brought to the operating room profound wall exploration and foreign body removal.    Procedure Detail:  He was seen in the holding area in the right lower quadrant abdominal wall LaMarca migrations. He was then brought to the operating room placed supine on the operating table and given a general anesthetic. The abdominal wall was sterilely prepped and draped.  The foreign body was grasped with a hemostat. Using some careful blunt and sharp dissection I created a track around the foreign body and retracted into the abdominal wall. Eventually I was able free it up. I then was able to remove what appeared to be a suture. Possibly PTFE suture as it had the consistency of plastic.  The wounds and hemostatic. I anesthetized the wound with Marcaine local anesthetic. I then packed the open wound with saline moistened gauze followed by dry dressing as the wound was contaminated.  He tolerated the procedure well without any apparent complications and was taken to the recovery room in satisfactory condition.   Estimated Blood Loss:  Minimal         Drains: none  Blood Given: none     Specimens: foreign body        Complications:  * No complications entered in OR log *         Disposition: PACU - hemodynamically stable.         Condition: stable

## 2012-02-17 NOTE — Interval H&P Note (Signed)
History and Physical Interval Note:  02/17/2012 2:39 PM  Matthew Francis  has presented today for surgery, with the diagnosis of Foreign body in abdominal wall  The various methods of treatment have been discussed with the patient and family. After consideration of risks, benefits and other options for treatment, the patient has consented to  Procedure(s) (LRB) with comments: REMOVAL FOREIGN BODY ABDOMINAL (N/A) - abdominal wound exploration and removal of foreign body as a surgical intervention .  The patient's history has been reviewed, patient examined, no change in status, stable for surgery.  I have reviewed the patient's chart and labs.  Questions were answered to the patient's satisfaction.     Gracelynn Bircher Lenna Sciara

## 2012-02-17 NOTE — Progress Notes (Signed)
Patient c/o itching and slight red rash noted above IV site. Vancomycin stopped.  Will notify surgeon

## 2012-02-17 NOTE — Transfer of Care (Signed)
Immediate Anesthesia Transfer of Care Note  Patient: Matthew Francis  Procedure(s) Performed: Procedure(s) (LRB) with comments: REMOVAL FOREIGN BODY ABDOMINAL (N/A) - abdominal wound exploration and removal of foreign body  Patient Location: PACU  Anesthesia Type: General  Level of Consciousness: awake, alert , oriented and patient cooperative  Airway & Oxygen Therapy: Patient Spontanous Breathing and Patient connected to face mask oxygen  Post-op Assessment: Report given to PACU RN and Post -op Vital signs reviewed and stable  Post vital signs: Reviewed and stable  Complications: No apparent anesthesia complications

## 2012-02-18 ENCOUNTER — Encounter (HOSPITAL_BASED_OUTPATIENT_CLINIC_OR_DEPARTMENT_OTHER): Payer: Self-pay | Admitting: General Surgery

## 2012-02-18 LAB — POCT HEMOGLOBIN-HEMACUE: Hemoglobin: 12.7 g/dL — ABNORMAL LOW (ref 13.0–17.0)

## 2012-02-18 NOTE — Addendum Note (Signed)
Addendum  created 02/18/12 0845 by Jewel Baize Dejae Bernet, CRNA   Modules edited:Anesthesia Responsible Staff

## 2012-02-18 NOTE — Addendum Note (Signed)
Addendum  created 02/18/12 0845 by Mikki Ziff D Adlene Adduci, CRNA   Modules edited:Anesthesia Responsible Staff    

## 2012-02-20 ENCOUNTER — Encounter (HOSPITAL_BASED_OUTPATIENT_CLINIC_OR_DEPARTMENT_OTHER): Payer: Self-pay

## 2012-02-27 ENCOUNTER — Encounter (INDEPENDENT_AMBULATORY_CARE_PROVIDER_SITE_OTHER): Payer: Self-pay | Admitting: General Surgery

## 2012-03-02 ENCOUNTER — Encounter (HOSPITAL_COMMUNITY): Payer: Self-pay

## 2012-03-02 ENCOUNTER — Emergency Department (HOSPITAL_COMMUNITY)
Admission: EM | Admit: 2012-03-02 | Discharge: 2012-03-02 | Disposition: A | Discharge status: Home / Self Care | Payer: Self-pay | Attending: Emergency Medicine | Admitting: Emergency Medicine

## 2012-03-02 ENCOUNTER — Emergency Department (HOSPITAL_COMMUNITY): Payer: Self-pay

## 2012-03-02 DIAGNOSIS — Z9089 Acquired absence of other organs: Secondary | ICD-10-CM | POA: Insufficient documentation

## 2012-03-02 DIAGNOSIS — R112 Nausea with vomiting, unspecified: Secondary | ICD-10-CM | POA: Insufficient documentation

## 2012-03-02 DIAGNOSIS — I10 Essential (primary) hypertension: Secondary | ICD-10-CM | POA: Insufficient documentation

## 2012-03-02 DIAGNOSIS — R109 Unspecified abdominal pain: Secondary | ICD-10-CM | POA: Insufficient documentation

## 2012-03-02 DIAGNOSIS — Z79899 Other long term (current) drug therapy: Secondary | ICD-10-CM | POA: Insufficient documentation

## 2012-03-02 LAB — COMPREHENSIVE METABOLIC PANEL
ALT: 9 U/L (ref 0–53)
AST: 16 U/L (ref 0–37)
Albumin: 3.4 g/dL — ABNORMAL LOW (ref 3.5–5.2)
Alkaline Phosphatase: 62 U/L (ref 39–117)
BUN: 7 mg/dL (ref 6–23)
CO2: 24 mEq/L (ref 19–32)
Calcium: 9.4 mg/dL (ref 8.4–10.5)
Chloride: 106 mEq/L (ref 96–112)
Creatinine, Ser: 1.19 mg/dL (ref 0.50–1.35)
GFR calc Af Amer: 82 mL/min — ABNORMAL LOW (ref >90)
GFR calc non Af Amer: 71 mL/min — ABNORMAL LOW (ref >90)
Glucose, Bld: 104 mg/dL — ABNORMAL HIGH (ref 70–99)
Potassium: 4 mEq/L (ref 3.5–5.1)
Sodium: 139 mEq/L (ref 135–145)
Total Bilirubin: 0.6 mg/dL (ref 0.3–1.2)
Total Protein: 6.6 g/dL (ref 6.0–8.3)

## 2012-03-02 LAB — CBC WITH DIFFERENTIAL/PLATELET
Basophils Absolute: 0 10*3/uL (ref 0.0–0.1)
Basophils Relative: 1 % (ref 0–1)
Eosinophils Absolute: 0.1 10*3/uL (ref 0.0–0.7)
Eosinophils Relative: 2 % (ref 0–5)
HCT: 37.3 % — ABNORMAL LOW (ref 39.0–52.0)
Hemoglobin: 12.6 g/dL — ABNORMAL LOW (ref 13.0–17.0)
Lymphocytes Relative: 27 % (ref 12–46)
Lymphs Abs: 1.1 10*3/uL (ref 0.7–4.0)
MCH: 30.5 pg (ref 26.0–34.0)
MCHC: 33.8 g/dL (ref 30.0–36.0)
MCV: 90.3 fL (ref 78.0–100.0)
Monocytes Absolute: 0.5 10*3/uL (ref 0.1–1.0)
Monocytes Relative: 12 % (ref 3–12)
Neutro Abs: 2.4 10*3/uL (ref 1.7–7.7)
Neutrophils Relative %: 59 % (ref 43–77)
Platelets: 179 10*3/uL (ref 150–400)
RBC: 4.13 MIL/uL — ABNORMAL LOW (ref 4.22–5.81)
RDW: 14.3 % (ref 11.5–15.5)
WBC: 4.1 10*3/uL (ref 4.0–10.5)

## 2012-03-02 LAB — LIPASE, BLOOD: Lipase: 23 U/L (ref 11–59)

## 2012-03-02 MED ORDER — HYDROMORPHONE HCL PF 1 MG/ML IJ SOLN
1.0000 mg | Freq: Once | INTRAMUSCULAR | Status: AC
  Administered 2012-03-02: 1 mg via INTRAVENOUS
  Filled 2012-03-02: qty 1

## 2012-03-02 MED ORDER — INFLUENZA VIRUS VACC SPLIT PF IM SUSP
0.5000 mL | Freq: Once | INTRAMUSCULAR | Status: AC
  Administered 2012-03-02: 0.5 mL via INTRAMUSCULAR
  Filled 2012-03-02: qty 0.5

## 2012-03-02 MED ORDER — OXYCODONE-ACETAMINOPHEN 5-325 MG PO TABS: 1.0000 | ORAL_TABLET | Freq: Four times a day (QID) | ORAL | Status: DC | PRN

## 2012-03-02 MED ORDER — MORPHINE SULFATE 4 MG/ML IJ SOLN: 6.0000 mg | Freq: Once | INTRAMUSCULAR | Status: DC

## 2012-03-02 MED ORDER — SODIUM CHLORIDE 0.9 % IV BOLUS (SEPSIS)
1000.0000 mL | Freq: Once | INTRAVENOUS | Status: AC
  Administered 2012-03-02: 1000 mL via INTRAVENOUS

## 2012-03-02 MED ORDER — INFLUENZA VIRUS VACC SPLIT PF IM SUSP: 0.5000 mL | INTRAMUSCULAR | Status: DC

## 2012-03-02 MED ORDER — ONDANSETRON HCL 4 MG/2ML IJ SOLN
4.0000 mg | Freq: Once | INTRAMUSCULAR | Status: AC
  Administered 2012-03-02: 4 mg via INTRAVENOUS
  Filled 2012-03-02: qty 2

## 2012-03-02 MED ORDER — PROMETHAZINE HCL 25 MG PO TABS: 25.0000 mg | ORAL_TABLET | Freq: Four times a day (QID) | ORAL | Status: DC | PRN

## 2012-03-02 NOTE — ED Notes (Signed)
Pt presents with no acute distress- generalized stomach pain and N/V without fever.  Recent stomach surgery 2 weeks ago for removal of FB

## 2012-03-02 NOTE — Progress Notes (Signed)
confirmed with [facility] in [facility] that pt is eligible for annual [facility] medication assistance program

## 2012-03-02 NOTE — ED Provider Notes (Signed)
History     CSN: [id number]  Arrival date & time 03/02/12  1213   First MD Initiated Contact with Patient 03/02/12 1234      Chief Complaint  Patient presents with  . Nausea  . Emesis  . Abdominal Pain    abdominal surgery at [facility] for removal of FB    (Consider location/radiation/quality/duration/timing/severity/associated sxs/prior treatment) HPI  The patient is 51 yo male that presents with abdominal pain.  The patient has an extensive history of GI surgeries including a foreign body removal from the abdomen 2 weeks ago.  The abdominal pain along with intermittent emesis and diarrhea began about one week ago.  The patient cannot pinpoint any specific trigger for his symptoms and has not found much relief with acetaminophen and loperamide.  The patient also reports headache. The patient denies fever, night sweats, dizziness, congestion, chest pain SOB, hematemesis, hematochezia, constipation, and urinary symptoms.  The patient reports eating a sandwich today without emesis.  He also reports drinking lots of fluids including water, Gatorade, and tea.    Past Medical History  Diagnosis Date  . Hypertension   . Inguinal hernia   . Hyperlipidemia   . Ulcer     ulcerative colitis  . GERD (gastroesophageal reflux disease)   . Arthritis   . Ulcerative colitis   . H/O ulcerative colitis   . Wears glasses     Past Surgical History  Procedure Date  . Ileoanal anastomosis   . Appendectomy   . Colectomy     Proctocolectomy with IPAA  . Colectomy   . Hernia repair     Multiple incisional hernias.  . Foreign body removal abdominal 02/17/2012    Procedure: REMOVAL FOREIGN BODY ABDOMINAL;  Surgeon: Adolph Pollack, MD;  Location: [facility];  Service: General;  Laterality: N/A;  abdominal wound exploration and removal of foreign body    Family History  Problem Relation Age of Onset  . Cancer Sister     History  Substance Use Topics  . Smoking status: Never Smoker    . Smokeless tobacco: Never Used  . Alcohol Use: 2.4 oz/week    4 Cans of beer per week     occ      Review of Systems  All other systems negative except as documented in the HPI. All pertinent positives and negatives as reviewed in the HPI.   Allergies  Asacol; Penicillins; Sulfa antibiotics; and Iodinated diagnostic agents  Home Medications   Current Outpatient Rx  Name Route Sig Dispense Refill  . ACETAMINOPHEN 500 MG PO TABS Oral Take 500 mg by mouth every 6 (six) hours as needed. pain    . CIPROFLOXACIN HCL 500 MG PO TABS Oral Take 500 mg by mouth 2 (two) times daily. Patient takes this on a regular basis has been on it for years uses it as an anit inflammatory    . FAMOTIDINE 20 MG PO TABS Oral Take 20 mg by mouth 2 (two) times daily.    Marland Kitchen HYDROCODONE-ACETAMINOPHEN 5-325 MG PO TABS Oral Take 1-2 tablets by mouth every 6 (six) hours as needed. Pain    . LOPERAMIDE HCL 2 MG PO CAPS Oral Take 4 mg by mouth 3 (three) times daily.     Marland Kitchen METOPROLOL TARTRATE 25 MG PO TABS Oral Take 25 mg by mouth daily.    . ADULT MULTIVITAMIN W/MINERALS CH Oral Take 1 tablet by mouth daily.      BP 122/83  Pulse 85  Temp 98.2 F (36.8 C)  Resp 20  SpO2 99%  Physical Exam  Nursing note and vitals reviewed. Constitutional: He appears well-developed and well-nourished. No distress.  HENT:  Head: Normocephalic and atraumatic.  Mouth/Throat: Oropharynx is clear and moist.  Eyes: Conjunctivae normal and EOM are normal. Pupils are equal, round, and reactive to light.  Neck: Normal range of motion. Neck supple. No thyromegaly present.  Cardiovascular: Normal rate, regular rhythm and normal heart sounds.   Pulmonary/Chest: Effort normal and breath sounds normal. No respiratory distress. He has no wheezes. He has no rales. He exhibits no tenderness.  Abdominal: Soft. Bowel sounds are normal. He exhibits no distension and no mass. There is tenderness. There is no rigidity, no rebound and no  guarding. No hernia.         Several post surgical scars on the abdomen. The patient is tender over the area where this object was removed. There is no rigidity or guarding.   Lymphadenopathy:    He has no cervical adenopathy.  Skin: Skin is warm and dry. No rash noted. He is not diaphoretic.    ED Course  Procedures (including critical care time)   Patient assessed and vitals stable.  CBC, CMP, Lipase, and UA to assess for infection and electrolyte abnormalities.  IV Fluids given for hydration.  Zofran given for symptomatic relief.  I spoke with Dr. Ezzard Standing, who is on for general surgery and he felt that based on the patient's testing and vital signs of the followup in the office.  Patient is advised return here for any worsening in his condition.  Patient is feeling improved after pain medications.  MDM  MDM Reviewed: nursing note and vitals Interpretation: labs and x-ray Consults: general surgery     Date: 03/02/2012  Rate: 66  Rhythm: normal sinus rhythm  QRS Axis: normal  Intervals: normal  ST/T Wave abnormalities: normal  Conduction Disutrbances:none  Narrative Interpretation:   Old EKG Reviewed: unchanged            Carlyle Dolly, PA-C 03/02/12 1743

## 2012-03-02 NOTE — Progress Notes (Signed)
ED Cm spoke with pt who prefers to [facility]/12 to purchase phenergan and percocet for $9 each (price quoted by [facility] outpatient pharmacist). Refused to wait to have [facility] pharmacy fill phenergan rx (25 mg for 10 tabs) Pt states he has to get home to get "food in my house"  "I will return tomorrow." CM reviewed list of self pay [place] providers, medicine resources and financial assistance. Pt voiced understanding and appreciation of services and resources offered

## 2012-03-05 NOTE — ED Provider Notes (Signed)
Medical screening examination/treatment/procedure(s) were performed by non-physician practitioner and as supervising physician I was immediately available for consultation/collaboration.   Itzy Adler Y. Jewelz Kobus, MD 03/05/12 1425 

## 2012-03-08 ENCOUNTER — Encounter (INDEPENDENT_AMBULATORY_CARE_PROVIDER_SITE_OTHER): Payer: Self-pay | Admitting: General Surgery

## 2012-03-08 ENCOUNTER — Ambulatory Visit (INDEPENDENT_AMBULATORY_CARE_PROVIDER_SITE_OTHER): Payer: Self-pay | Admitting: General Surgery

## 2012-03-08 VITALS — BP 116/80 | HR 84 | Temp 97.1°F | Resp 16 | Ht 71.0 in | Wt 171.6 lb

## 2012-03-08 DIAGNOSIS — Z09 Encounter for follow-up examination after completed treatment for conditions other than malignant neoplasm: Secondary | ICD-10-CM

## 2012-03-08 NOTE — Progress Notes (Signed)
Operation:  Removal of right lower quadrant abdominal wall foreign body  Date:  February 17, 2012  Pathology: Suture material  HPI:  He is here for his first postoperative visit. The first week he was doing very well and had no pain. Last week however he had an episode of severe discomfort and ended up in the emergency department for this. His wound is completely healed but he feels a tender nodule just beneath the skin.   Physical Exam: Abdomen-multiple scars are present. In the right lower quadrant there is a small scar with a firm nodule just deep to it.  Assessment: Status post removal of foreign body from abdominal wall-he may be having some fibrosis or scarring versus chronic inflammatory changes. I did not aggressively debride this area at the time.  Plan: Warm moist compresses and massage as to the area. He continues to have symptoms and we talked about doing an aggressive debridement of the abdominal wall area and leaving an open wound to heal in by secondary intention. He understands that there is no guarantee that will help him with his pain. I've asked him to call me back if he does not get relief of the pain.

## 2012-03-08 NOTE — Patient Instructions (Signed)
Warm moist compresses to the area.  Call if you are still having problems after 2 weeks.

## 2012-03-15 ENCOUNTER — Telehealth (INDEPENDENT_AMBULATORY_CARE_PROVIDER_SITE_OTHER): Payer: Self-pay | Admitting: General Surgery

## 2012-03-15 DIAGNOSIS — R109 Unspecified abdominal pain: Secondary | ICD-10-CM

## 2012-03-15 DIAGNOSIS — R110 Nausea: Secondary | ICD-10-CM

## 2012-03-15 MED ORDER — TRAMADOL HCL 50 MG PO TABS: 50.0000 mg | ORAL_TABLET | ORAL | Status: AC | PRN

## 2012-03-15 NOTE — Telephone Encounter (Signed)
MR Rapozo CALLED IN TO REPORT TO DR. Abbey Chatters  THAT HE HAS AN ALLERGY TO TRAMADOL, WHICH WAS CALLED TO PHARMACY EARLIER TODAY. I NOTIFIED DR. Abbey Chatters AND HE SAID HE WOULD WRITE A RX AND LEAVE AT FRONT DESK FOR PICK-UP/ MESSAGE LEFT ON PHONE RE PICK-UP/GY

## 2012-03-15 NOTE — Telephone Encounter (Signed)
Rx for Tramadol 50mg  #30 w/ 1 refill sent to pharmacy.

## 2012-03-15 NOTE — Telephone Encounter (Signed)
Pt called to report he is in a lot of pain.  No fever, but some new swelling.  He states the Tylenol he has been taking no longer is effective for pain control.  (He has severe nausea and vomiting with Hydrocodone.)  If Dr. Abbey Chatters can order him some pain medication, please call to [facility]:  [phone].

## 2012-03-15 NOTE — Telephone Encounter (Signed)
Pt cannot take Tramadol.  Rx for Percocet 5/325 #30 written.  Pt contacted to p/u.

## 2012-03-24 ENCOUNTER — Telehealth (INDEPENDENT_AMBULATORY_CARE_PROVIDER_SITE_OTHER): Payer: Self-pay

## 2012-03-24 NOTE — Telephone Encounter (Signed)
Patient called in requesting a refill on his pain medication.  Patient rec'd an RX for Oxycodone 5/325mg  on 03/02/12, #15 from [facility], [place], [place] and Hydrocodone 5/500mg  02/17/12,  # 30 from Dr. Abbey Chatters.

## 2012-03-31 ENCOUNTER — Other Ambulatory Visit (INDEPENDENT_AMBULATORY_CARE_PROVIDER_SITE_OTHER): Payer: Self-pay | Admitting: General Surgery

## 2012-03-31 DIAGNOSIS — IMO0002 Reserved for concepts with insufficient information to code with codable children: Secondary | ICD-10-CM

## 2012-03-31 MED ORDER — HYDROCODONE-ACETAMINOPHEN 5-500 MG PO TABS: 2.0000 | ORAL_TABLET | Freq: Four times a day (QID) | ORAL | Status: AC | PRN

## 2012-04-12 ENCOUNTER — Ambulatory Visit (INDEPENDENT_AMBULATORY_CARE_PROVIDER_SITE_OTHER): Payer: Self-pay | Admitting: General Surgery

## 2012-04-26 ENCOUNTER — Encounter (INDEPENDENT_AMBULATORY_CARE_PROVIDER_SITE_OTHER): Payer: Self-pay | Admitting: General Surgery

## 2017-06-03 ENCOUNTER — Ambulatory Visit
Admission: RE | Admit: 2017-06-03 | Discharge: 2017-06-03 | Disposition: A | Discharge status: Home / Self Care | Payer: Commercial Managed Care - PPO | Attending: Gastroenterology | Admitting: Gastroenterology

## 2017-06-03 DIAGNOSIS — K91850 Pouchitis: Principal | ICD-10-CM

## 2017-06-03 DIAGNOSIS — I10 Essential (primary) hypertension: Secondary | ICD-10-CM

## 2017-06-03 MED ORDER — CIPROFLOXACIN 500 MG TABLET: 500 mg | tablet | Freq: Two times a day (BID) | 2 refills | 0 days | Status: AC

## 2017-06-03 MED ORDER — HYOSCYAMINE ER 0.375 MG TABLET,EXTENDED RELEASE,12 HR
ORAL_TABLET | Freq: Two times a day (BID) | ORAL | 2 refills | 0.00000 days | Status: CP | PRN
Start: 2017-06-03 — End: 2018-01-16

## 2017-06-03 MED ORDER — LOPERAMIDE 2 MG CAPSULE
ORAL_CAPSULE | Freq: Four times a day (QID) | ORAL | 3 refills | 0.00000 days | Status: CP | PRN
Start: 2017-06-03 — End: 2017-09-01

## 2017-06-03 MED ORDER — LOPERAMIDE 2 MG CAPSULE: 2 mg | capsule | Freq: Four times a day (QID) | 0 refills | 0 days | Status: AC

## 2017-06-03 MED ORDER — DESIPRAMINE 10 MG TABLET
ORAL_TABLET | Freq: Every day | ORAL | 3 refills | 0.00000 days | Status: CP
Start: 2017-06-03 — End: 2017-09-01

## 2017-06-03 MED ORDER — CIPROFLOXACIN 500 MG TABLET
ORAL_TABLET | Freq: Two times a day (BID) | ORAL | 2 refills | 0.00000 days | Status: CP
Start: 2017-06-03 — End: 2017-06-03

## 2017-06-03 MED ORDER — AMLODIPINE 5 MG TABLET
ORAL_TABLET | Freq: Every day | ORAL | 2 refills | 0 days | Status: CP
Start: 2017-06-03 — End: 2017-08-06

## 2017-06-03 MED ORDER — OMEPRAZOLE 20 MG CAPSULE,DELAYED RELEASE
ORAL_CAPSULE | Freq: Every day | ORAL | 2 refills | 0.00000 days | Status: CP
Start: 2017-06-03 — End: 2017-12-02

## 2017-08-06 MED ORDER — AMLODIPINE 5 MG TABLET
ORAL_TABLET | 2 refills | 0 days | Status: CP
Start: 2017-08-06 — End: 2018-04-15

## 2017-08-27 MED ORDER — CIPROFLOXACIN 500 MG TABLET
ORAL_TABLET | Freq: Two times a day (BID) | ORAL | 2 refills | 0.00000 days | Status: CP
Start: 2017-08-27 — End: 2017-12-02

## 2017-09-24 MED ORDER — AMLODIPINE 5 MG TABLET
ORAL_TABLET | 0 refills | 0 days | Status: CP
Start: 2017-09-24 — End: 2017-10-21

## 2017-10-22 MED ORDER — AMLODIPINE 5 MG TABLET
ORAL_TABLET | 0 refills | 0 days | Status: CP
Start: 2017-10-22 — End: 2017-11-27

## 2017-11-27 MED ORDER — AMLODIPINE 5 MG TABLET
ORAL_TABLET | 0 refills | 0 days | Status: CP
Start: 2017-11-27 — End: 2017-12-02

## 2017-12-02 ENCOUNTER — Encounter
Admit: 2017-12-02 | Discharge: 2017-12-03 | Payer: PRIVATE HEALTH INSURANCE | Attending: Gastroenterology | Primary: Gastroenterology

## 2017-12-02 DIAGNOSIS — K219 Gastro-esophageal reflux disease without esophagitis: Secondary | ICD-10-CM

## 2017-12-02 DIAGNOSIS — K91850 Pouchitis: Principal | ICD-10-CM

## 2017-12-02 DIAGNOSIS — I10 Essential (primary) hypertension: Secondary | ICD-10-CM

## 2017-12-02 MED ORDER — AMLODIPINE 5 MG TABLET
ORAL_TABLET | Freq: Every day | ORAL | 2 refills | 0 days | Status: CP
Start: 2017-12-02 — End: 2018-04-15

## 2017-12-02 MED ORDER — CIPROFLOXACIN 500 MG TABLET
ORAL_TABLET | Freq: Two times a day (BID) | ORAL | 2 refills | 0 days | Status: CP
Start: 2017-12-02 — End: 2018-08-13

## 2017-12-02 MED ORDER — HYOSCYAMINE ER 0.375 MG TABLET,EXTENDED RELEASE,12 HR
ORAL_TABLET | Freq: Two times a day (BID) | ORAL | 3 refills | 0 days | Status: CP | PRN
Start: 2017-12-02 — End: 2018-03-02

## 2017-12-02 MED ORDER — OMEPRAZOLE 20 MG CAPSULE,DELAYED RELEASE
ORAL_CAPSULE | Freq: Every day | ORAL | 2 refills | 0 days | Status: CP
Start: 2017-12-02 — End: 2019-01-11

## 2017-12-02 MED ORDER — DESIPRAMINE 10 MG TABLET
ORAL_TABLET | Freq: Every day | ORAL | 3 refills | 0.00000 days | Status: CP
Start: 2017-12-02 — End: 2018-04-15

## 2017-12-09 MED ORDER — CHOLECALCIFEROL (VITAMIN D3) 1,250 MCG (50,000 UNIT) CAPSULE
ORAL_CAPSULE | ORAL | 0 refills | 0.00000 days | Status: CP
Start: 2017-12-09 — End: 2018-04-19

## 2018-01-18 MED ORDER — HYOSCYAMINE ER 0.375 MG TABLET,EXTENDED RELEASE,12 HR
ORAL_TABLET | 2 refills | 0 days | Status: CP
Start: 2018-01-18 — End: 2018-04-15

## 2018-03-31 ENCOUNTER — Emergency Department (HOSPITAL_COMMUNITY)
Admission: EM | Admit: 2018-03-31 | Discharge: 2018-03-31 | Disposition: A | Discharge status: Home / Self Care | Payer: 59 | Attending: Emergency Medicine | Admitting: Emergency Medicine

## 2018-03-31 ENCOUNTER — Other Ambulatory Visit: Payer: Self-pay

## 2018-03-31 ENCOUNTER — Encounter (HOSPITAL_COMMUNITY): Payer: Self-pay | Admitting: Emergency Medicine

## 2018-03-31 ENCOUNTER — Emergency Department (HOSPITAL_COMMUNITY): Payer: 59

## 2018-03-31 DIAGNOSIS — K5190 Ulcerative colitis, unspecified, without complications: Secondary | ICD-10-CM | POA: Diagnosis not present

## 2018-03-31 DIAGNOSIS — I129 Hypertensive chronic kidney disease with stage 1 through stage 4 chronic kidney disease, or unspecified chronic kidney disease: Secondary | ICD-10-CM | POA: Diagnosis not present

## 2018-03-31 DIAGNOSIS — R1030 Lower abdominal pain, unspecified: Secondary | ICD-10-CM

## 2018-03-31 DIAGNOSIS — Z79899 Other long term (current) drug therapy: Secondary | ICD-10-CM | POA: Insufficient documentation

## 2018-03-31 DIAGNOSIS — N189 Chronic kidney disease, unspecified: Secondary | ICD-10-CM

## 2018-03-31 DIAGNOSIS — R197 Diarrhea, unspecified: Secondary | ICD-10-CM | POA: Insufficient documentation

## 2018-03-31 DIAGNOSIS — K529 Noninfective gastroenteritis and colitis, unspecified: Secondary | ICD-10-CM

## 2018-03-31 LAB — URINALYSIS, ROUTINE W REFLEX MICROSCOPIC
BILIRUBIN URINE: NEGATIVE
Glucose, UA: NEGATIVE mg/dL
Hgb urine dipstick: NEGATIVE
KETONES UR: NEGATIVE mg/dL
LEUKOCYTES UA: NEGATIVE
NITRITE: NEGATIVE
PH: 6 (ref 5.0–8.0)
Protein, ur: NEGATIVE mg/dL
Specific Gravity, Urine: 1.011 (ref 1.005–1.030)

## 2018-03-31 LAB — COMPREHENSIVE METABOLIC PANEL
ALT: 15 U/L (ref 0–44)
AST: 27 U/L (ref 15–41)
Albumin: 4.5 g/dL (ref 3.5–5.0)
Alkaline Phosphatase: 75 U/L (ref 38–126)
Anion gap: 9 (ref 5–15)
BILIRUBIN TOTAL: 1 mg/dL (ref 0.3–1.2)
BUN: 10 mg/dL (ref 6–20)
CHLORIDE: 108 mmol/L (ref 98–111)
CO2: 27 mmol/L (ref 22–32)
CREATININE: 1.29 mg/dL — AB (ref 0.61–1.24)
Calcium: 10.5 mg/dL — ABNORMAL HIGH (ref 8.9–10.3)
Glucose, Bld: 129 mg/dL — ABNORMAL HIGH (ref 70–99)
POTASSIUM: 4.1 mmol/L (ref 3.5–5.1)
Sodium: 144 mmol/L (ref 135–145)
TOTAL PROTEIN: 8.5 g/dL — AB (ref 6.5–8.1)

## 2018-03-31 LAB — LIPASE, BLOOD: LIPASE: 44 U/L (ref 11–51)

## 2018-03-31 LAB — CBC
HEMATOCRIT: 45.3 % (ref 39.0–52.0)
HEMOGLOBIN: 14.4 g/dL (ref 13.0–17.0)
MCH: 30 pg (ref 26.0–34.0)
MCHC: 31.8 g/dL (ref 30.0–36.0)
MCV: 94.4 fL (ref 80.0–100.0)
NRBC: 0 % (ref 0.0–0.2)
Platelets: 201 10*3/uL (ref 150–400)
RBC: 4.8 MIL/uL (ref 4.22–5.81)
RDW: 14.2 % (ref 11.5–15.5)
WBC: 5.7 10*3/uL (ref 4.0–10.5)

## 2018-03-31 LAB — C-REACTIVE PROTEIN: CRP: 1.4 mg/dL — AB (ref <1.0)

## 2018-03-31 MED ORDER — METRONIDAZOLE 500 MG PO TABS: 500.0000 mg | ORAL_TABLET | Freq: Three times a day (TID) | ORAL | 0 refills | Status: AC

## 2018-03-31 MED ORDER — METHYLPREDNISOLONE SODIUM SUCC 40 MG IJ SOLR
40.0000 mg | Freq: Once | INTRAMUSCULAR | Status: AC
  Administered 2018-03-31: 40 mg via INTRAVENOUS
  Filled 2018-03-31: qty 1

## 2018-03-31 MED ORDER — HYDROMORPHONE HCL 1 MG/ML IJ SOLN
1.0000 mg | Freq: Once | INTRAMUSCULAR | Status: AC
  Administered 2018-03-31: 1 mg via INTRAVENOUS
  Filled 2018-03-31: qty 1

## 2018-03-31 MED ORDER — CIPROFLOXACIN HCL 500 MG PO TABS
500.0000 mg | ORAL_TABLET | Freq: Once | ORAL | Status: AC
  Administered 2018-03-31: 500 mg via ORAL
  Filled 2018-03-31: qty 1

## 2018-03-31 MED ORDER — METRONIDAZOLE IN NACL 5-0.79 MG/ML-% IV SOLN
500.0000 mg | Freq: Once | INTRAVENOUS | Status: AC
  Administered 2018-03-31: 500 mg via INTRAVENOUS
  Filled 2018-03-31: qty 100

## 2018-03-31 MED ORDER — SODIUM CHLORIDE 0.9 % IV BOLUS
2000.0000 mL | Freq: Once | INTRAVENOUS | Status: AC
  Administered 2018-03-31: 2000 mL via INTRAVENOUS

## 2018-03-31 MED ORDER — PANTOPRAZOLE SODIUM 40 MG PO TBEC
40.0000 mg | DELAYED_RELEASE_TABLET | Freq: Every day | ORAL | Status: DC
  Administered 2018-03-31: 40 mg via ORAL
  Filled 2018-03-31: qty 1

## 2018-03-31 MED ORDER — HYDROCODONE-ACETAMINOPHEN 5-325 MG PO TABS: 1.0000 | ORAL_TABLET | Freq: Four times a day (QID) | ORAL | 0 refills | Status: DC | PRN

## 2018-03-31 MED ORDER — CIPROFLOXACIN HCL 500 MG PO TABS: 500.0000 mg | ORAL_TABLET | Freq: Two times a day (BID) | ORAL | 0 refills | Status: DC

## 2018-03-31 NOTE — ED Provider Notes (Signed)
[facility]-EMERGENCY DEPT Provider Note   CSN: [id number] Arrival date & time: 03/31/18  1449     History   Chief Complaint Chief Complaint  Patient presents with  . Abdominal Pain    HPI Matthew Francis is a 57 y.o. male.  HPI  Patient is a 57 year old male with a history of ulcerative colitis status post total colectomy and 3-stage IPAA, hyperlipidemia, hypertension, and GERD presenting for lower abdominal pain, and mucus type diarrhea.  Patient reports that this is been progressively worsening over the last 2 weeks and reports that it feels identical to his prior ulcerative colitis flares.  Patient reports most recently approximately 10 stools per day.  Patient reports that he has tried a number of medication changes with his gastroenterologist, Dr. Gwenith Spitz at [facility] including trying to taper from ciprofloxacin twice daily to Cipro ofloxacin once daily.  Patient reports that he has seen streaks of blood in the stool but mostly watery mucus.  He reports decreased urination over the past 48 hours.  Patient denies fevers.  Patient reports nausea without vomiting.  Past Medical History:  Diagnosis Date  . Arthritis   . GERD (gastroesophageal reflux disease)   . H/O ulcerative colitis   . Hyperlipidemia   . Hypertension   . Inguinal hernia   . Ulcer    ulcerative colitis  . Ulcerative colitis   . Wears glasses     Patient Active Problem List   Diagnosis Date Noted  . 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 02/09/2012    Past Surgical History:  Procedure Laterality Date  . APPENDECTOMY    . COLECTOMY     Proctocolectomy with IPAA  . COLECTOMY    . FOREIGN BODY REMOVAL ABDOMINAL  02/17/2012   Procedure: REMOVAL FOREIGN BODY ABDOMINAL;  Surgeon: Adolph Pollack, MD;  Location: [facility];  Service: General;  Laterality: N/A;  abdominal wound exploration and removal of foreign body  . HERNIA REPAIR     Multiple incisional  hernias.  . ileoanal anastomosis          Home Medications    Prior to Admission medications   Medication Sig Start Date End Date Taking? Authorizing Provider  acetaminophen (TYLENOL) 500 MG tablet Take 500 mg by mouth every 6 (six) hours as needed. pain    [provider]  ciprofloxacin (CIPRO) 500 MG tablet Take 500 mg by mouth 2 (two) times daily. Patient takes this on a regular basis has been on it for years uses it as an anit inflammatory    [provider]  famotidine (PEPCID) 20 MG tablet Take 20 mg by mouth 2 (two) times daily.    [provider]  HYDROcodone-acetaminophen (NORCO/VICODIN) 5-325 MG per tablet Take 1-2 tablets by mouth every 6 (six) hours as needed. Pain 02/17/12   Avel Peace, MD  loperamide (IMODIUM) 2 MG capsule Take 4 mg by mouth 3 (three) times daily.     [provider]  metoprolol tartrate (LOPRESSOR) 25 MG tablet Take 25 mg by mouth daily.    [provider]  Multiple Vitamin (MULTIVITAMIN WITH MINERALS) TABS Take 1 tablet by mouth daily.    [provider]  oxyCODONE-acetaminophen (PERCOCET/ROXICET) 5-325 MG per tablet Take 1 tablet by mouth every 6 (six) hours as needed for pain. 03/02/12   Lawyer, Cristal Deer, PA-C  promethazine (PHENERGAN) 25 MG tablet Take 1 tablet (25 mg total) by mouth every 6 (six) hours as needed for  nausea. 03/02/12   Charlestine Night, PA-C    Family History Family History  Problem Relation Age of Onset  . Cancer Sister     Social History Social History   Tobacco Use  . Smoking status: Never Smoker  . Smokeless tobacco: Never Used  Substance Use Topics  . Alcohol use: Yes    Alcohol/week: 4.0 standard drinks    Types: 4 Cans of beer per week    Comment: 4 cans of beer per week  . Drug use: No     Allergies   Asacol [mesalamine]; Penicillins; Sulfa antibiotics; and Iodinated diagnostic agents   Review of Systems Review of Systems  Constitutional: Negative  for chills and fever.  HENT: Negative for congestion and sore throat.   Eyes: Negative for visual disturbance.  Respiratory: Negative for cough, chest tightness and shortness of breath.   Cardiovascular: Negative for chest pain.  Gastrointestinal: Positive for abdominal pain, blood in stool, diarrhea and nausea. Negative for vomiting.  Genitourinary: Negative for dysuria and flank pain.  Musculoskeletal: Negative for back pain and myalgias.  Skin: Negative for rash.  Neurological: Negative for dizziness, syncope, light-headedness and headaches.     Physical Exam Updated Vital Signs BP 130/90   Pulse 91   Temp 98.1 F (36.7 C)   Resp 14   SpO2 99%   Physical Exam  Constitutional: He appears well-developed and well-nourished. No distress.  HENT:  Head: Normocephalic and atraumatic.  Mouth/Throat: Oropharynx is clear and moist.  Eyes: Pupils are equal, round, and reactive to light. Conjunctivae and EOM are normal.  Neck: Normal range of motion. Neck supple.  Cardiovascular: Normal rate, regular rhythm, S1 normal and S2 normal.  No murmur heard. Pulmonary/Chest: Effort normal and breath sounds normal. He has no wheezes. He has no rales.  Abdominal: Soft. He exhibits no distension. Bowel sounds are increased. There is tenderness. There is no guarding.  Well-healed ventral abdominal surgical scar present.  Lower abdominal tenderness to palpation without guarding or rebound.  No focal nature to tenderness.  Musculoskeletal: Normal range of motion. He exhibits no edema or deformity.  Lymphadenopathy:    He has no cervical adenopathy.  Neurological: He is alert.  Cranial nerves grossly intact. Patient moves extremities symmetrically and with good coordination.  Skin: Skin is warm and dry. No rash noted. No erythema.  Psychiatric: He has a normal mood and affect. His behavior is normal. Judgment and thought content normal.  Nursing note and vitals reviewed.    ED Treatments /  Results  Labs (all labs ordered are listed, but only abnormal results are displayed) Labs Reviewed  COMPREHENSIVE METABOLIC PANEL - Abnormal; Notable for the following components:      Result Value   Glucose, Bld 129 (*)    Creatinine, Ser 1.29 (*)    Calcium 10.5 (*)    Total Protein 8.5 (*)    All other components within normal limits  URINALYSIS, ROUTINE W REFLEX MICROSCOPIC - Abnormal; Notable for the following components:   Color, Urine STRAW (*)    All other components within normal limits  C-REACTIVE PROTEIN - Abnormal; Notable for the following components:   CRP 1.4 (*)    All other components within normal limits  LIPASE, BLOOD  CBC    EKG None  Radiology Dg Abdomen Acute W/chest  Result Date: 03/31/2018 CLINICAL DATA:  57 year old male with lower abdominal pain and diarrhea. History of ulcerative colitis. EXAM: DG ABDOMEN ACUTE W/ 1V CHEST COMPARISON:  03/02/2012 chest  and abdominal radiographs. FINDINGS: Lung volumes and mediastinal contours are stable and within normal limits. Visualized tracheal air column is within normal limits. No pneumothorax or pneumoperitoneum. Both lungs appear stable in clear. Postoperative changes with staple lines in the pelvis. Surgical clips in the right abdomen. Similar prominent right upper quadrant large bowel gas. Nondilated small bowel gas in the right lower abdomen. Paucity of bowel gas elsewhere. Abdominal and pelvic visceral contours appear stable. No acute osseous abnormality identified. IMPRESSION: 1. Similar bowel-gas pattern to that in 2013 without evidence of obstruction or free air. 2.  No acute cardiopulmonary abnormality. Electronically Signed   By: Odessa Fleming M.D.   On: 03/31/2018 18:48    Procedures Procedures (including critical care time)  Medications Ordered in ED Medications  sodium chloride 0.9 % bolus 2,000 mL (has no administration in time range)  HYDROmorphone (DILAUDID) injection 1 mg (has no administration in time  range)  metroNIDAZOLE (FLAGYL) IVPB 500 mg (has no administration in time range)  methylPREDNISolone sodium succinate (SOLU-MEDROL) 40 mg/mL injection 40 mg (has no administration in time range)     Initial Impression / Assessment and Plan / ED Course  I have reviewed the triage vital signs and the nursing notes.  Pertinent labs & imaging results that were available during my care of the patient were reviewed by me and considered in my medical decision making (see chart for details).     Patient is nontoxic-appearing, afebrile, and with nonsurgical abdomen.  Differential diagnosis includes UC flare, bowel obstruction, infectious colitis.  Given patient's history and symptoms consistent with prior, will initiate therapy consistent with prior ulcerative colitis flares.  Lab Results  Component Value Date   CREATININE 1.29 (H) 03/31/2018   CREATININE 1.19 03/02/2012   CREATININE 1.31 02/13/2012   Work-up revealed slightly elevated creatinine, but not elevated BUN.  No leukocytosis.  Total calcium and protein slightly elevated.  CRP 1.4.  X-ray acute abdomen which is demonstrating nonobstructive bowel gas pattern and postsurgical changes.  Patient takes daily ciprofloxacin, and increases to twice daily with flares.  Reading prior gastroenterology notes from [facility], patient is placed on Flagyl during acute flares.  Patient has previously had tapering steroids for acute flares, however this does not appear to be most recent practice per gastroenterology notes.  Patient agreed to call his gastroenterologist first thing tomorrow to update on emergency department visit, and discuss further management.  Return precautions were given for any fever, worsening pain, focal pain, intractable nausea or vomiting, or bright red blood per rectum.  Patient is in understanding and agrees with the plan of care.  This is a shared visit with Dr. Tilden Fossa. Patient was independently evaluated/examined by this  attending physician. Attending physician consulted in evaluation and discharge management.  Final Clinical Impressions(s) / ED Diagnoses   Final diagnoses:  Lower abdominal pain  Diarrhea, unspecified type  IBD (inflammatory bowel disease)  Chronic kidney disease, unspecified CKD stage    ED Discharge Orders         Ordered    ciprofloxacin (CIPRO) 500 MG tablet  2 times daily     03/31/18 2010    metroNIDAZOLE (FLAGYL) 500 MG tablet  3 times daily     03/31/18 2010    HYDROcodone-acetaminophen (NORCO/VICODIN) 5-325 MG tablet  Every 6 hours PRN     03/31/18 2012           Delia Chimes 04/01/18 0041    Tilden Fossa, MD 04/04/18 [phone]

## 2018-03-31 NOTE — Discharge Instructions (Addendum)
Please see the information and instructions below regarding your visit.  Your diagnoses today include:  1. Lower abdominal pain   2. Diarrhea, unspecified type   3. IBD (inflammatory bowel disease)     Tests performed today include: See side panel of your discharge paperwork for testing performed today. Vital signs are listed at the bottom of these instructions.   Medications prescribed:    Take any prescribed medications only as prescribed, and any over the counter medications only as directed on the packaging.  Please start taking ciprofloxacin twice a day for the next 7 to 10 days.  You will take Flagyl 3 times a day for the next 7-10 days.  Please be in close contact with your gastroenterologist during this period of medical management.  Please take all of your antibiotics until finished.   You may develop abdominal discomfort or nausea from the antibiotic. If this occurs, you may take it with food. Some patients also get diarrhea with antibiotics. You may help offset this with probiotics which you can buy or get in yogurt. Do not eat or take the probiotics until 2 hours after your antibiotic.    Some people develop allergies to antibiotics. Symptoms of antibiotic allergy can be mild and include a flat rash and itching. They can also be more serious and include:  ?Hives - Hives are raised, red patches of skin that are usually very itchy.  ?Lip or tongue swelling  ?Trouble swallowing or breathing  ?Blistering of the skin or mouth.  If you have any of these serious symptoms, please seek emergency medical care immediately.  You have been prescribed Norco for pain. This is an opioid pain medication. You may take this medication every 4-6 hours as needed for pain. Only take this medication if you need it for breakthrough pain.   Do not combine this medication with Tylenol, as it may increase the risk of liver problems.  Do not combine this medication with alcohol.  Please be advised  to avoid driving or operating heavy machinery while taking this medication, as it may make you drowsy or impair judgment.    Home care instructions:  Please follow any educational materials contained in this packet.   Follow-up instructions: Please follow-up with Dr. Gwenith Spitz as soon as possible, preferably calling him tomorrow to let me know you are in the emergency department.  Return instructions:  Please return to the Emergency Department if you experience worsening symptoms.  Return to the emergency department immediately if you have worsening pain, you see bright red blood mixed into the stool, you have persistent vomiting, chest pain, shortness of breath, or feeling that you are going to pass out. Please return if you have any other emergent concerns.  Additional Information:   Your vital signs today were: BP (!) 148/98    Pulse 71    Temp 98.1 F (36.7 C)    Resp 15    SpO2 100%  If your blood pressure (BP) was elevated on multiple readings during this visit above 130 for the top number or above 80 for the bottom number, please have this repeated by your primary care provider within one month. --------------  Thank you for allowing [place] to participate in your care today.

## 2018-03-31 NOTE — ED Notes (Signed)
ED Provider at bedside. 

## 2018-03-31 NOTE — ED Triage Notes (Signed)
Patient c/o lower abdominal pain with diarrhea x2 weeks. Hx colitis. Denies N/V.

## 2018-04-15 ENCOUNTER — Encounter
Admit: 2018-04-15 | Discharge: 2018-04-15 | Payer: PRIVATE HEALTH INSURANCE | Attending: Gastroenterology | Primary: Gastroenterology

## 2018-04-15 DIAGNOSIS — K91850 Pouchitis: Principal | ICD-10-CM

## 2018-04-15 DIAGNOSIS — R7309 Other abnormal glucose: Secondary | ICD-10-CM

## 2018-04-15 MED ORDER — HYOSCYAMINE ER 0.375 MG TABLET,EXTENDED RELEASE,12 HR
ORAL_TABLET | Freq: Two times a day (BID) | ORAL | 2 refills | 0.00000 days | Status: CP | PRN
Start: 2018-04-15 — End: 2018-07-14

## 2018-04-15 MED ORDER — DESIPRAMINE 10 MG TABLET
ORAL_TABLET | Freq: Every day | ORAL | 3 refills | 0.00000 days | Status: SS
Start: 2018-04-15 — End: 2018-09-01

## 2018-04-15 MED ORDER — CEFDINIR 300 MG CAPSULE
ORAL_CAPSULE | Freq: Two times a day (BID) | ORAL | 0 refills | 0 days | Status: CP
Start: 2018-04-15 — End: 2018-04-21

## 2018-04-15 MED ORDER — AMLODIPINE 5 MG TABLET
ORAL_TABLET | Freq: Every day | ORAL | 2 refills | 0 days | Status: CP
Start: 2018-04-15 — End: ?

## 2018-04-19 MED ORDER — CHOLECALCIFEROL (VITAMIN D3) 1,250 MCG (50,000 UNIT) CAPSULE
ORAL_CAPSULE | ORAL | 0 refills | 0 days | Status: CP
Start: 2018-04-19 — End: 2018-10-27

## 2018-04-21 MED ORDER — CEFDINIR 300 MG CAPSULE
ORAL_CAPSULE | Freq: Every day | ORAL | 5 refills | 0 days | Status: SS
Start: 2018-04-21 — End: 2018-09-01

## 2018-06-14 ENCOUNTER — Encounter (HOSPITAL_COMMUNITY): Payer: Self-pay | Admitting: *Deleted

## 2018-06-14 ENCOUNTER — Emergency Department (HOSPITAL_COMMUNITY)
Admission: EM | Admit: 2018-06-14 | Discharge: 2018-06-14 | Disposition: A | Discharge status: Home / Self Care | Payer: 59 | Attending: Emergency Medicine | Admitting: Emergency Medicine

## 2018-06-14 DIAGNOSIS — I10 Essential (primary) hypertension: Secondary | ICD-10-CM | POA: Diagnosis not present

## 2018-06-14 DIAGNOSIS — Z79899 Other long term (current) drug therapy: Secondary | ICD-10-CM | POA: Diagnosis not present

## 2018-06-14 DIAGNOSIS — R109 Unspecified abdominal pain: Secondary | ICD-10-CM

## 2018-06-14 DIAGNOSIS — R252 Cramp and spasm: Secondary | ICD-10-CM | POA: Diagnosis not present

## 2018-06-14 LAB — URINALYSIS, ROUTINE W REFLEX MICROSCOPIC
Bacteria, UA: NONE SEEN
Bilirubin Urine: NEGATIVE
Glucose, UA: NEGATIVE mg/dL
Hgb urine dipstick: NEGATIVE
KETONES UR: NEGATIVE mg/dL
Leukocytes, UA: NEGATIVE
Nitrite: NEGATIVE
PH: 5 (ref 5.0–8.0)
Protein, ur: 100 mg/dL — AB
Specific Gravity, Urine: 1.028 (ref 1.005–1.030)

## 2018-06-14 LAB — COMPREHENSIVE METABOLIC PANEL
ALBUMIN: 4.5 g/dL (ref 3.5–5.0)
ALK PHOS: 77 U/L (ref 38–126)
ALT: 29 U/L (ref 0–44)
AST: 33 U/L (ref 15–41)
Anion gap: 9 (ref 5–15)
BUN: 11 mg/dL (ref 6–20)
CALCIUM: 10 mg/dL (ref 8.9–10.3)
CO2: 24 mmol/L (ref 22–32)
CREATININE: 1.22 mg/dL (ref 0.61–1.24)
Chloride: 106 mmol/L (ref 98–111)
GFR calc Af Amer: >60 mL/min (ref >60)
GFR calc non Af Amer: >60 mL/min (ref >60)
GLUCOSE: 100 mg/dL — AB (ref 70–99)
Potassium: 4.3 mmol/L (ref 3.5–5.1)
SODIUM: 139 mmol/L (ref 135–145)
Total Bilirubin: 0.9 mg/dL (ref 0.3–1.2)
Total Protein: 8.7 g/dL — ABNORMAL HIGH (ref 6.5–8.1)

## 2018-06-14 LAB — CBC
HCT: 46.5 % (ref 39.0–52.0)
Hemoglobin: 14.8 g/dL (ref 13.0–17.0)
MCH: 30.5 pg (ref 26.0–34.0)
MCHC: 31.8 g/dL (ref 30.0–36.0)
MCV: 95.7 fL (ref 80.0–100.0)
PLATELETS: 219 10*3/uL (ref 150–400)
RBC: 4.86 MIL/uL (ref 4.22–5.81)
RDW: 14.3 % (ref 11.5–15.5)
WBC: 6.6 10*3/uL (ref 4.0–10.5)
nRBC: 0 % (ref 0.0–0.2)

## 2018-06-14 LAB — LIPASE, BLOOD: Lipase: 40 U/L (ref 11–51)

## 2018-06-14 MED ORDER — DICYCLOMINE HCL 20 MG PO TABS: 20.0000 mg | ORAL_TABLET | Freq: Two times a day (BID) | ORAL | 0 refills | Status: DC | PRN

## 2018-06-14 MED ORDER — DICYCLOMINE HCL 10 MG PO CAPS
10.0000 mg | ORAL_CAPSULE | Freq: Once | ORAL | Status: AC
  Administered 2018-06-14: 10 mg via ORAL
  Filled 2018-06-14: qty 1

## 2018-06-14 NOTE — ED Provider Notes (Signed)
[facility]-EMERGENCY DEPT Provider Note   CSN: [id number] Arrival date & time: 06/14/18  1600     History   Chief Complaint Chief Complaint  Patient presents with  . Abdominal Pain    HPI Matthew Francis Traum is a 57 y.o. male resenting for evaluation of abdominal pain.  Patient states with past 4 days, he has been having right-sided abdominal spasm.  Patient states spasms are worse when he tries to use his stomach muscles.  He has had 11 stomach surgeries due to colitis, infections, and hernias.  He is currently followed with [facility].  He reports a history of ulcerative colitis, GERD, and hypertension.  Patient states he has had similar spasms before, which have done well with Bentyl.  He has not needed Bentyl for many years.  He denies fall, trauma, or injury.  He denies fevers, chills, chest pain, shortness breath, nausea, vomiting, urinary symptoms, abnormal bowel movements.  He has taken Tylenol with mild improvement of his symptoms.  He has not tried anything else.  HPI  Past Medical History:  Diagnosis Date  . Arthritis   . GERD (gastroesophageal reflux disease)   . H/O ulcerative colitis   . Hyperlipidemia   . Hypertension   . Inguinal hernia   . Ulcer    ulcerative colitis  . Ulcerative colitis   . Wears glasses     Patient Active Problem List   Diagnosis Date Noted  . 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 02/09/2012    Past Surgical History:  Procedure Laterality Date  . APPENDECTOMY    . COLECTOMY     Proctocolectomy with IPAA  . COLECTOMY    . FOREIGN BODY REMOVAL ABDOMINAL  02/17/2012   Procedure: REMOVAL FOREIGN BODY ABDOMINAL;  Surgeon: Adolph Pollackodd J Rosenbower, MD;  Location: [facility];  Service: General;  Laterality: N/A;  abdominal wound exploration and removal of foreign body  . HERNIA REPAIR     Multiple incisional hernias.  . ileoanal anastomosis          Home Medications    Prior to Admission  medications   Medication Sig Start Date End Date Taking? Authorizing Provider  acetaminophen (TYLENOL) 500 MG tablet Take 500 mg by mouth every 6 (six) hours as needed for mild pain.    Yes [provider]  amLODipine (NORVASC) 5 MG tablet Take 5 mg by mouth daily. 02/19/18  Yes [provider]  cetirizine (ZYRTEC) 10 MG tablet Take 10 mg by mouth daily as needed for allergies.    Yes [provider]  ciprofloxacin (CIPRO) 500 MG tablet Take 1 tablet (500 mg total) by mouth 2 (two) times daily. Take twice daily for one week, then resume once daily. Patient takes this on a regular basis has been on it for years uses it as an anti inflammatory 03/31/18  Yes Dayton ScrapeMurray, Alyssa B, PA-C  desipramine (NOPRAMIN) 10 MG tablet Take 10 mg by mouth daily.   Yes [provider]  esomeprazole (NEXIUM) 20 MG capsule Take 20 mg by mouth daily as needed (indigestion).   Yes [provider]  famotidine (PEPCID) 20 MG tablet Take 20 mg by mouth daily as needed for heartburn or indigestion.   Yes [provider]  Glucosamine HCl (GLUCOSAMINE PO) Take 1 tablet by mouth daily.   Yes [provider]  hyoscyamine (LEVBID) 0.375 MG 12 hr tablet Take 0.375 mg by mouth every 12 (twelve) hours as needed for cramping.  Yes [provider]  Multiple Vitamin (MULTIVITAMIN WITH MINERALS) TABS Take 1 tablet by mouth daily.   Yes [provider]  Omega-3 Fatty Acids (FISH OIL) 1000 MG CAPS Take 1 capsule by mouth 2 (two) times daily.   Yes [provider]  omeprazole (PRILOSEC) 20 MG capsule Take 20 mg by mouth daily. 03/24/18  Yes [provider]  psyllium (METAMUCIL) 58.6 % packet Take 1 packet by mouth daily.   Yes [provider]  Cholecalciferol (VITAMIN D3) 50000 units CAPS Take 50,000 Units by mouth once a week. 03/05/18   [provider]  dicyclomine (BENTYL) 20 MG tablet Take 1 tablet (20 mg total) by mouth 2 (two)  times daily as needed for spasms. 06/14/18   Shakiyla Kook, PA-C  HYDROcodone-acetaminophen (NORCO/VICODIN) 5-325 MG tablet Take 1 tablet by mouth every 6 (six) hours as needed. Patient not taking: Reported on 06/14/2018 03/31/18   Aviva KluverMurray, Alyssa B, PA-C  loperamide (IMODIUM A-Francis) 2 MG tablet Take 2 mg by mouth 2 (two) times daily.     [provider]  oxyCODONE-acetaminophen (PERCOCET/ROXICET) 5-325 MG per tablet Take 1 tablet by mouth every 6 (six) hours as needed for pain. Patient not taking: Reported on 03/31/2018 03/02/12   Charlestine NightLawyer, Christopher, PA-C  promethazine (PHENERGAN) 25 MG tablet Take 1 tablet (25 mg total) by mouth every 6 (six) hours as needed for nausea. Patient not taking: Reported on 03/31/2018 03/02/12   Charlestine NightLawyer, Christopher, PA-C    Family History Family History  Problem Relation Age of Onset  . Cancer Sister     Social History Social History   Tobacco Use  . Smoking status: Never Smoker  . Smokeless tobacco: Never Used  Substance Use Topics  . Alcohol use: Yes    Alcohol/week: 4.0 standard drinks    Types: 4 Cans of beer per week    Comment: 4 cans of beer per week  . Drug use: No     Allergies   Asacol [mesalamine]; Penicillins; Sulfa antibiotics; Bee venom; and Iodinated diagnostic agents   Review of Systems Review of Systems  Gastrointestinal: Positive for abdominal pain.  All other systems reviewed and are negative.    Physical Exam Updated Vital Signs BP (!) 144/106   Pulse 94   Temp 97.8 F (36.6 C) (Oral)   Resp 18   Ht 5\' 10"  (1.778 m)   Wt 89.8 kg   SpO2 100%   BMI 28.41 kg/m   Physical Exam Vitals signs and nursing note reviewed.  Constitutional:      General: He is not in acute distress.    Appearance: He is well-developed.     Comments: Appears nontoxic  HENT:     Head: Normocephalic and atraumatic.  Eyes:     Conjunctiva/sclera: Conjunctivae normal.     Pupils: Pupils are equal, round, and reactive to light.    Neck:     Musculoskeletal: Normal range of motion and neck supple.  Cardiovascular:     Rate and Rhythm: Normal rate and regular rhythm.  Pulmonary:     Effort: Pulmonary effort is normal. No respiratory distress.     Breath sounds: Normal breath sounds. No wheezing.  Abdominal:     General: There is no distension.     Palpations: Abdomen is soft.     Tenderness: There is abdominal tenderness.     Comments: Multiple surgical scars noted.  Mild tenderness palpation of the right mid abdomen without rigidity, guarding, distention.  Negative rebound.  No masses.  No obvious hernia.  Musculoskeletal: Normal range of motion.  Skin:    General: Skin is warm and dry.     Capillary Refill: Capillary refill takes less than 2 seconds.  Neurological:     Mental Status: He is alert and oriented to person, place, and time.      ED Treatments / Results  Labs (all labs ordered are listed, but only abnormal results are displayed) Labs Reviewed  COMPREHENSIVE METABOLIC PANEL - Abnormal; Notable for the following components:      Result Value   Glucose, Bld 100 (*)    Total Protein 8.7 (*)    All other components within normal limits  URINALYSIS, ROUTINE W REFLEX MICROSCOPIC - Abnormal; Notable for the following components:   Protein, ur 100 (*)    All other components within normal limits  LIPASE, BLOOD  CBC    EKG None  Radiology No results found.  Procedures Procedures (including critical care time)  Medications Ordered in ED Medications  dicyclomine (BENTYL) capsule 10 mg (10 mg Oral Given 06/14/18 2024)     Initial Impression / Assessment and Plan / ED Course  I have reviewed the triage vital signs and the nursing notes.  Pertinent labs & imaging results that were available during my care of the patient were reviewed by me and considered in my medical decision making (see chart for details).     Patient presenting for evaluation of abdominal spasms.  Physical  examination, he is afebrile not tachycardic.  Appears nontoxic.  Mild tenderness palpation of the abdomen.  Labs reassuring, no leukocytosis.  Kidney, liver, pancreatic function reassuring.  Urine without infection.  At this time, doubt intra-abdominal infection, perforation, obstruction, or surgical abdomen.  Patient mildly tachycardic, this improved without intervention.  Will give Bentyl and reassess.  On reassessment, patient reports asthma is much improved.  Discussed importance of follow-up with his GI doctor for further evaluation.  Prescription for Bentyl given.  Discussed use of Tylenol and heating pads for further pain control.  At this time, patient appears safe for discharge.  Return precautions given.  Patient states he understands agrees plan.   Final Clinical Impressions(s) / ED Diagnoses   Final diagnoses:  Abdominal spasms    ED Discharge Orders         Ordered    dicyclomine (BENTYL) 20 MG tablet  2 times daily PRN     06/14/18 2057           Alveria Apley, PA-C 06/14/18 2238    Azalia Bilis, MD 06/14/18 2345

## 2018-06-14 NOTE — ED Triage Notes (Signed)
Pt complains of right sided abdominal pain x 4 days. Pt believes the pain is muscular. Pt has had multiple abdominal surgeries and has IBS, which he states is also flaring up.

## 2018-06-14 NOTE — Discharge Instructions (Addendum)
Continue taking home medications. Use bentyl as needed for spasm Use tylenol as needed for pain.  Follow up with your stomach doctor for further evaluation of your symptoms.  Return to the ER with any new, worsening, or concerning symptoms.

## 2018-08-13 MED ORDER — CIPROFLOXACIN 500 MG TABLET
ORAL_TABLET | Freq: Two times a day (BID) | ORAL | 1 refills | 0.00000 days | Status: CP
Start: 2018-08-13 — End: 2018-10-28

## 2018-08-23 DIAGNOSIS — R197 Diarrhea, unspecified: Principal | ICD-10-CM

## 2018-08-30 DIAGNOSIS — K91850 Pouchitis: Principal | ICD-10-CM

## 2018-08-30 DIAGNOSIS — Z7982 Long term (current) use of aspirin: Principal | ICD-10-CM

## 2018-08-30 DIAGNOSIS — Z9049 Acquired absence of other specified parts of digestive tract: Principal | ICD-10-CM

## 2018-08-30 DIAGNOSIS — Z980 Intestinal bypass and anastomosis status: Principal | ICD-10-CM

## 2018-08-30 DIAGNOSIS — Z882 Allergy status to sulfonamides status: Principal | ICD-10-CM

## 2018-08-30 DIAGNOSIS — K219 Gastro-esophageal reflux disease without esophagitis: Principal | ICD-10-CM

## 2018-08-30 DIAGNOSIS — R197 Diarrhea, unspecified: Principal | ICD-10-CM

## 2018-08-30 DIAGNOSIS — Z79899 Other long term (current) drug therapy: Principal | ICD-10-CM

## 2018-08-30 DIAGNOSIS — Z8719 Personal history of other diseases of the digestive system: Principal | ICD-10-CM

## 2018-08-30 DIAGNOSIS — I10 Essential (primary) hypertension: Principal | ICD-10-CM

## 2018-08-30 DIAGNOSIS — Z880 Allergy status to penicillin: Principal | ICD-10-CM

## 2018-09-01 ENCOUNTER — Encounter
Admit: 2018-09-01 | Discharge: 2018-09-01 | Payer: PRIVATE HEALTH INSURANCE | Attending: Anesthesiology | Primary: Anesthesiology

## 2018-09-01 ENCOUNTER — Encounter: Admit: 2018-09-01 | Discharge: 2018-09-01 | Payer: PRIVATE HEALTH INSURANCE

## 2018-09-01 DIAGNOSIS — K91850 Pouchitis: Principal | ICD-10-CM

## 2018-09-01 DIAGNOSIS — R197 Diarrhea, unspecified: Principal | ICD-10-CM

## 2018-09-01 DIAGNOSIS — Z7982 Long term (current) use of aspirin: Principal | ICD-10-CM

## 2018-09-01 DIAGNOSIS — Z880 Allergy status to penicillin: Principal | ICD-10-CM

## 2018-09-01 DIAGNOSIS — Z9049 Acquired absence of other specified parts of digestive tract: Principal | ICD-10-CM

## 2018-09-01 DIAGNOSIS — Z882 Allergy status to sulfonamides status: Principal | ICD-10-CM

## 2018-09-01 DIAGNOSIS — Z79899 Other long term (current) drug therapy: Principal | ICD-10-CM

## 2018-09-01 DIAGNOSIS — I10 Essential (primary) hypertension: Principal | ICD-10-CM

## 2018-09-01 DIAGNOSIS — K219 Gastro-esophageal reflux disease without esophagitis: Principal | ICD-10-CM

## 2018-09-01 DIAGNOSIS — Z980 Intestinal bypass and anastomosis status: Principal | ICD-10-CM

## 2018-09-01 DIAGNOSIS — K5190 Ulcerative colitis, unspecified, without complications: Principal | ICD-10-CM

## 2018-09-01 DIAGNOSIS — Z8719 Personal history of other diseases of the digestive system: Principal | ICD-10-CM

## 2018-09-01 MED ORDER — HYOSCYAMINE ER 0.375 MG TABLET,EXTENDED RELEASE,12 HR: 0 mg | tablet | Freq: Two times a day (BID) | 12 refills | 0 days | Status: AC

## 2018-09-01 MED ORDER — BUDESONIDE DR - ER 3 MG CAPSULE,DELAYED,EXTENDED RELEASE
ORAL_CAPSULE | Freq: Every morning | ORAL | 0 refills | 0.00000 days | Status: CP
Start: 2018-09-01 — End: 2018-09-01

## 2018-09-01 MED ORDER — BUDESONIDE DR - ER 3 MG CAPSULE,DELAYED,EXTENDED RELEASE: 9 mg | capsule | Freq: Every morning | 0 refills | 0 days | Status: AC

## 2018-09-01 MED ORDER — HYOSCYAMINE ER 0.375 MG TABLET,EXTENDED RELEASE,12 HR
ORAL_TABLET | Freq: Two times a day (BID) | ORAL | 12 refills | 0.00000 days | Status: CP | PRN
Start: 2018-09-01 — End: 2018-09-01

## 2018-09-09 DIAGNOSIS — I10 Essential (primary) hypertension: Principal | ICD-10-CM

## 2018-09-09 DIAGNOSIS — Z9049 Acquired absence of other specified parts of digestive tract: Principal | ICD-10-CM

## 2018-09-09 DIAGNOSIS — K91850 Pouchitis: Principal | ICD-10-CM

## 2018-09-09 DIAGNOSIS — K5190 Ulcerative colitis, unspecified, without complications: Principal | ICD-10-CM

## 2018-09-09 DIAGNOSIS — K219 Gastro-esophageal reflux disease without esophagitis: Principal | ICD-10-CM

## 2018-10-12 ENCOUNTER — Ambulatory Visit (INDEPENDENT_AMBULATORY_CARE_PROVIDER_SITE_OTHER): Payer: Self-pay | Admitting: Licensed Clinical Social Worker

## 2018-10-12 ENCOUNTER — Encounter (HOSPITAL_COMMUNITY): Payer: Self-pay | Admitting: Licensed Clinical Social Worker

## 2018-10-12 DIAGNOSIS — F1420 Cocaine dependence, uncomplicated: Secondary | ICD-10-CM
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
## 2018-10-19 ENCOUNTER — Telehealth
Admit: 2018-10-19 | Discharge: 2018-10-20 | Payer: PRIVATE HEALTH INSURANCE | Attending: Gastroenterology | Primary: Gastroenterology

## 2018-10-19 MED ORDER — TINIDAZOLE 250 MG TABLET
ORAL_TABLET | Freq: Two times a day (BID) | ORAL | 0 refills | 0 days | Status: CP
Start: 2018-10-19 — End: 2018-11-18

## 2018-10-19 MED ORDER — DIPHENOXYLATE-ATROPINE 2.5 MG-0.025 MG TABLET
ORAL_TABLET | Freq: Four times a day (QID) | ORAL | 6 refills | 0 days | Status: CP
Start: 2018-10-19 — End: 2018-11-18

## 2018-10-28 ENCOUNTER — Telehealth
Admit: 2018-10-28 | Discharge: 2018-10-29 | Payer: PRIVATE HEALTH INSURANCE | Attending: Gastroenterology | Primary: Gastroenterology

## 2018-10-28 MED ORDER — CIPROFLOXACIN 500 MG TABLET
ORAL_TABLET | Freq: Two times a day (BID) | ORAL | 1 refills | 0 days | Status: CP
Start: 2018-10-28 — End: 2019-02-10

## 2018-11-15 MED ORDER — BUDESONIDE DR - ER 3 MG CAPSULE,DELAYED,EXTENDED RELEASE
ORAL_CAPSULE | Freq: Every morning | ORAL | 0 refills | 0.00000 days | Status: CP
Start: 2018-11-15 — End: 2019-02-07

## 2018-12-13 MED ORDER — HYDROCORTISONE 2.5 % TOPICAL CREAM WITH PERINEAL APPLICATOR
Freq: Two times a day (BID) | RECTAL | 0 refills | 0.00000 days | Status: CP
Start: 2018-12-13 — End: ?

## 2019-01-11 MED ORDER — OMEPRAZOLE 20 MG CAPSULE,DELAYED RELEASE
ORAL_CAPSULE | 2 refills | 0 days | Status: CP
Start: 2019-01-11 — End: ?

## 2019-01-18 MED ORDER — CHOLECALCIFEROL (VITAMIN D3) 1,250 MCG (50,000 UNIT) CAPSULE
ORAL_CAPSULE | 0 refills | 0 days | Status: CP
Start: 2019-01-18 — End: 2019-02-07

## 2019-02-03 MED ORDER — DOXYCYCLINE HYCLATE 100 MG TABLET
ORAL_TABLET | Freq: Two times a day (BID) | ORAL | 0 refills | 14 days | Status: CP
Start: 2019-02-03 — End: 2019-02-17

## 2019-02-03 MED ORDER — CEFDINIR 300 MG CAPSULE
ORAL_CAPSULE | Freq: Two times a day (BID) | ORAL | 0 refills | 14 days | Status: CP
Start: 2019-02-03 — End: 2019-02-03

## 2019-02-03 MED ORDER — AMITRIPTYLINE 25 MG TABLET
ORAL_TABLET | Freq: Every evening | ORAL | 3 refills | 90 days | Status: CP
Start: 2019-02-03 — End: 2020-02-03

## 2019-02-03 MED ORDER — TRAMADOL 50 MG TABLET
ORAL_TABLET | Freq: Four times a day (QID) | ORAL | 0 refills | 15 days | Status: CP | PRN
Start: 2019-02-03 — End: 2019-02-04

## 2019-02-04 MED ORDER — TRAMADOL 50 MG TABLET
ORAL_TABLET | Freq: Four times a day (QID) | ORAL | 0 refills | 15.00000 days | Status: CP | PRN
Start: 2019-02-04 — End: ?

## 2019-02-07 MED ORDER — BUDESONIDE DR - ER 3 MG CAPSULE,DELAYED,EXTENDED RELEASE: 9 mg | capsule | Freq: Every morning | 0 refills | 60 days | Status: AC

## 2019-02-07 MED ORDER — CHOLECALCIFEROL (VITAMIN D3) 1,250 MCG (50,000 UNIT) CAPSULE
ORAL_CAPSULE | Freq: Every day | ORAL | 0 refills | 0.00000 days | Status: CP
Start: 2019-02-07 — End: 2019-03-05

## 2019-02-07 MED ORDER — BUDESONIDE DR - ER 3 MG CAPSULE,DELAYED,EXTENDED RELEASE
ORAL_CAPSULE | Freq: Every morning | ORAL | 1 refills | 0.00000 days | Status: CP
Start: 2019-02-07 — End: 2019-05-08

## 2019-02-10 ENCOUNTER — Encounter
Admit: 2019-02-10 | Discharge: 2019-02-11 | Payer: PRIVATE HEALTH INSURANCE | Attending: Gastroenterology | Primary: Gastroenterology

## 2019-02-10 DIAGNOSIS — E611 Iron deficiency: Secondary | ICD-10-CM

## 2019-02-10 DIAGNOSIS — K91850 Pouchitis: Principal | ICD-10-CM

## 2019-02-10 DIAGNOSIS — E559 Vitamin D deficiency, unspecified: Secondary | ICD-10-CM

## 2019-02-10 MED ORDER — CIPROFLOXACIN 500 MG TABLET
ORAL_TABLET | Freq: Two times a day (BID) | ORAL | 0 refills | 90.00000 days | Status: CP
Start: 2019-02-10 — End: 2019-05-11

## 2019-02-10 MED ORDER — DICYCLOMINE 10 MG CAPSULE
ORAL_CAPSULE | Freq: Four times a day (QID) | ORAL | 11 refills | 30.00000 days | Status: CP
Start: 2019-02-10 — End: 2020-02-10

## 2019-02-25 MED ORDER — PREDNISONE 10 MG TABLET
ORAL_TABLET | 0 refills | 0 days | Status: CP
Start: 2019-02-25 — End: ?

## 2019-03-01 ENCOUNTER — Encounter: Admit: 2019-03-01 | Discharge: 2019-03-02 | Payer: PRIVATE HEALTH INSURANCE

## 2019-03-01 DIAGNOSIS — K91850 Pouchitis: Secondary | ICD-10-CM

## 2019-03-01 DIAGNOSIS — K4090 Unilateral inguinal hernia, without obstruction or gangrene, not specified as recurrent: Secondary | ICD-10-CM

## 2019-03-01 DIAGNOSIS — K56699 Other intestinal obstruction unspecified as to partial versus complete obstruction: Secondary | ICD-10-CM

## 2019-03-01 DIAGNOSIS — Z9049 Acquired absence of other specified parts of digestive tract: Secondary | ICD-10-CM

## 2019-03-04 DIAGNOSIS — R7303 Prediabetes: Secondary | ICD-10-CM

## 2019-03-04 MED ORDER — LIRAGLUTIDE 0.6 MG/0.1 ML (18 MG/3 ML) SUBCUTANEOUS PEN INJECTOR
SUBCUTANEOUS | 0 refills | 28 days | Status: CP
Start: 2019-03-04 — End: 2019-04-01

## 2019-03-14 ENCOUNTER — Telehealth
Admit: 2019-03-14 | Discharge: 2019-03-15 | Payer: PRIVATE HEALTH INSURANCE | Attending: Gastroenterology | Primary: Gastroenterology

## 2019-03-14 DIAGNOSIS — K91850 Pouchitis: Secondary | ICD-10-CM

## 2019-03-14 DIAGNOSIS — E559 Vitamin D deficiency, unspecified: Secondary | ICD-10-CM

## 2019-03-23 MED ORDER — PEN NEEDLE, DIABETIC 32 GAUGE X 5/32" (4 MM)
Freq: Every day | 0 refills | 1 days | Status: CP
Start: 2019-03-23 — End: ?

## 2019-04-07 ENCOUNTER — Encounter
Admit: 2019-04-07 | Discharge: 2019-04-08 | Payer: PRIVATE HEALTH INSURANCE | Attending: Gastroenterology | Primary: Gastroenterology

## 2019-04-07 MED ORDER — LEVOFLOXACIN 500 MG TABLET: 500 mg | tablet | Freq: Every day | 0 refills | 10 days | Status: AC

## 2019-04-25 MED ORDER — AMLODIPINE 5 MG TABLET
ORAL_TABLET | Freq: Every day | ORAL | 2 refills | 90.00000 days | Status: CP
Start: 2019-04-25 — End: ?

## 2019-05-04 ENCOUNTER — Ambulatory Visit: Admit: 2019-05-04 | Discharge: 2019-05-04 | Payer: PRIVATE HEALTH INSURANCE

## 2019-05-04 ENCOUNTER — Ambulatory Visit
Admit: 2019-05-04 | Discharge: 2019-05-04 | Payer: PRIVATE HEALTH INSURANCE | Attending: Gastroenterology | Primary: Gastroenterology

## 2019-05-04 DIAGNOSIS — K91850 Pouchitis: Principal | ICD-10-CM

## 2019-05-04 DIAGNOSIS — E611 Iron deficiency: Principal | ICD-10-CM

## 2019-05-04 MED ORDER — CIPROFLOXACIN 500 MG TABLET
ORAL_TABLET | Freq: Two times a day (BID) | ORAL | 0 refills | 90 days | Status: CP
Start: 2019-05-04 — End: 2019-08-02

## 2019-07-22 MED ORDER — AMLODIPINE 5 MG TABLET
ORAL_TABLET | Freq: Every day | ORAL | 2 refills | 90 days | Status: CP
Start: 2019-07-22 — End: ?

## 2019-08-20 ENCOUNTER — Ambulatory Visit: Payer: Self-pay | Attending: Internal Medicine

## 2019-08-20 DIAGNOSIS — Z23 Encounter for immunization: Secondary | ICD-10-CM | POA: Insufficient documentation

## 2019-08-20 NOTE — Progress Notes (Signed)
   Covid-19 Vaccination Clinic  Name:  Matthew Francis    MRN: [id number] DOB: [birth year]  08/20/2019  [name] was observed post Covid-19 immunization for 15 minutes without incident. He was provided with Vaccine Information Sheet and instruction to access the V-Safe system.   [name] was instructed to call 911 with any severe reactions post vaccine: Marland Kitchen Difficulty breathing  . Swelling of face and throat  . A fast heartbeat  . A bad rash all over body  . Dizziness and weakness   Immunizations Administered    Name Date Dose VIS Date Route   Pfizer COVID-19 Vaccine 08/20/2019  9:12 AM 0.3 mL 05/27/2019 Intramuscular   Manufacturer: [place]   Lot: [id number]   [place]: [id number]

## 2019-09-10 ENCOUNTER — Ambulatory Visit: Payer: Self-pay | Attending: Internal Medicine

## 2019-09-10 DIAGNOSIS — Z23 Encounter for immunization: Secondary | ICD-10-CM

## 2019-09-10 NOTE — Progress Notes (Signed)
   Covid-19 Vaccination Clinic  Name:  MANUAL NAVARRA    MRN: [id number] DOB: 26-Jan-1961  09/10/2019  Mr. Tillman was observed post Covid-19 immunization for 15 minutes without incident. He was provided with Vaccine Information Sheet and instruction to access the V-Safe system.   Mr. Clugston was instructed to call 911 with any severe reactions post vaccine: Marland Kitchen Difficulty breathing  . Swelling of face and throat  . A fast heartbeat  . A bad rash all over body  . Dizziness and weakness   Immunizations Administered    Name Date Dose VIS Date Route   Pfizer COVID-19 Vaccine 09/10/2019  9:51 AM 0.3 mL 05/27/2019 Intramuscular   Manufacturer: [place]   Lot: [id number]   [place]: [id number]

## 2019-10-03 MED ORDER — CHOLECALCIFEROL (VITAMIN D3) 1,250 MCG (50,000 UNIT) CAPSULE
ORAL_CAPSULE | ORAL | 0 refills | 28.00000 days | Status: CP
Start: 2019-10-03 — End: 2020-10-02

## 2019-11-01 MED ORDER — BUDESONIDE DR - ER 3 MG CAPSULE,DELAYED,EXTENDED RELEASE
ORAL_CAPSULE | Freq: Every morning | ORAL | 1 refills | 30.00000 days | Status: CP
Start: 2019-11-01 — End: 2020-10-31

## 2019-11-23 ENCOUNTER — Ambulatory Visit: Admit: 2019-11-23 | Attending: Gastroenterology | Primary: Gastroenterology

## 2019-12-28 ENCOUNTER — Ambulatory Visit
Admit: 2019-12-28 | Discharge: 2019-12-29 | Payer: PRIVATE HEALTH INSURANCE | Attending: Gastroenterology | Primary: Gastroenterology

## 2019-12-28 DIAGNOSIS — R7309 Other abnormal glucose: Principal | ICD-10-CM

## 2019-12-28 DIAGNOSIS — E611 Iron deficiency: Principal | ICD-10-CM

## 2019-12-28 DIAGNOSIS — K91850 Pouchitis: Principal | ICD-10-CM

## 2019-12-28 MED ORDER — PREDNISONE 10 MG TABLET
ORAL_TABLET | 0 refills | 0 days | Status: CP
Start: 2019-12-28 — End: ?

## 2019-12-29 MED ORDER — POLYETHYLENE GLYCOL 3350 17 GRAM/DOSE ORAL POWDER
0 refills | 0 days | Status: CP
Start: 2019-12-29 — End: ?
  Filled 2020-01-02: qty 238, 1d supply, fill #0

## 2020-01-02 MED FILL — POLYETHYLENE GLYCOL 3350 17 GRAM/DOSE ORAL POWDER: 1 days supply | Qty: 238 | Fill #0 | Status: AC

## 2020-01-06 MED ORDER — BUDESONIDE DR - ER 3 MG CAPSULE,DELAYED,EXTENDED RELEASE
ORAL_CAPSULE | Freq: Every morning | ORAL | 1 refills | 30 days | Status: CP
Start: 2020-01-06 — End: 2021-01-05

## 2020-01-17 ENCOUNTER — Emergency Department (HOSPITAL_COMMUNITY): Payer: 59

## 2020-01-17 ENCOUNTER — Encounter (HOSPITAL_COMMUNITY): Payer: Self-pay | Admitting: Internal Medicine

## 2020-01-17 ENCOUNTER — Other Ambulatory Visit: Payer: Self-pay

## 2020-01-17 ENCOUNTER — Inpatient Hospital Stay (HOSPITAL_COMMUNITY)
Admission: EM | Admit: 2020-01-17 | Discharge: 2020-01-24 | DRG: 872 | Disposition: A | Discharge status: Home / Self Care | Payer: 59 | Attending: Internal Medicine | Admitting: Internal Medicine

## 2020-01-17 DIAGNOSIS — Z91041 Radiographic dye allergy status: Secondary | ICD-10-CM | POA: Diagnosis not present

## 2020-01-17 DIAGNOSIS — D649 Anemia, unspecified: Secondary | ICD-10-CM | POA: Diagnosis present

## 2020-01-17 DIAGNOSIS — R6520 Severe sepsis without septic shock: Secondary | ICD-10-CM

## 2020-01-17 DIAGNOSIS — N179 Acute kidney failure, unspecified: Secondary | ICD-10-CM | POA: Diagnosis present

## 2020-01-17 DIAGNOSIS — R739 Hyperglycemia, unspecified: Secondary | ICD-10-CM | POA: Diagnosis present

## 2020-01-17 DIAGNOSIS — Z1612 Extended spectrum beta lactamase (ESBL) resistance: Secondary | ICD-10-CM | POA: Diagnosis present

## 2020-01-17 DIAGNOSIS — K91850 Pouchitis: Secondary | ICD-10-CM | POA: Diagnosis present

## 2020-01-17 DIAGNOSIS — B9620 Unspecified Escherichia coli [E. coli] as the cause of diseases classified elsewhere: Secondary | ICD-10-CM | POA: Diagnosis not present

## 2020-01-17 DIAGNOSIS — A4151 Sepsis due to Escherichia coli [E. coli]: Secondary | ICD-10-CM | POA: Diagnosis not present

## 2020-01-17 DIAGNOSIS — D696 Thrombocytopenia, unspecified: Secondary | ICD-10-CM | POA: Diagnosis present

## 2020-01-17 DIAGNOSIS — K51911 Ulcerative colitis, unspecified with rectal bleeding: Secondary | ICD-10-CM | POA: Diagnosis present

## 2020-01-17 DIAGNOSIS — R7881 Bacteremia: Secondary | ICD-10-CM | POA: Insufficient documentation

## 2020-01-17 DIAGNOSIS — K5289 Other specified noninfective gastroenteritis and colitis: Secondary | ICD-10-CM | POA: Diagnosis not present

## 2020-01-17 DIAGNOSIS — E785 Hyperlipidemia, unspecified: Secondary | ICD-10-CM | POA: Diagnosis present

## 2020-01-17 DIAGNOSIS — B9629 Other Escherichia coli [E. coli] as the cause of diseases classified elsewhere: Secondary | ICD-10-CM | POA: Diagnosis present

## 2020-01-17 DIAGNOSIS — K219 Gastro-esophageal reflux disease without esophagitis: Secondary | ICD-10-CM | POA: Diagnosis present

## 2020-01-17 DIAGNOSIS — E872 Acidosis: Secondary | ICD-10-CM | POA: Diagnosis present

## 2020-01-17 DIAGNOSIS — R6510 Systemic inflammatory response syndrome (SIRS) of non-infectious origin without acute organ dysfunction: Secondary | ICD-10-CM

## 2020-01-17 DIAGNOSIS — Z882 Allergy status to sulfonamides status: Secondary | ICD-10-CM

## 2020-01-17 DIAGNOSIS — Z9049 Acquired absence of other specified parts of digestive tract: Secondary | ICD-10-CM | POA: Diagnosis not present

## 2020-01-17 DIAGNOSIS — Z20822 Contact with and (suspected) exposure to covid-19: Secondary | ICD-10-CM | POA: Diagnosis present

## 2020-01-17 DIAGNOSIS — I10 Essential (primary) hypertension: Secondary | ICD-10-CM | POA: Diagnosis present

## 2020-01-17 DIAGNOSIS — Z79899 Other long term (current) drug therapy: Secondary | ICD-10-CM | POA: Diagnosis not present

## 2020-01-17 DIAGNOSIS — A419 Sepsis, unspecified organism: Principal | ICD-10-CM | POA: Diagnosis present

## 2020-01-17 DIAGNOSIS — I4891 Unspecified atrial fibrillation: Secondary | ICD-10-CM | POA: Diagnosis not present

## 2020-01-17 DIAGNOSIS — Z888 Allergy status to other drugs, medicaments and biological substances status: Secondary | ICD-10-CM

## 2020-01-17 DIAGNOSIS — Z7952 Long term (current) use of systemic steroids: Secondary | ICD-10-CM | POA: Diagnosis not present

## 2020-01-17 DIAGNOSIS — N390 Urinary tract infection, site not specified: Secondary | ICD-10-CM | POA: Diagnosis present

## 2020-01-17 DIAGNOSIS — Z91030 Bee allergy status: Secondary | ICD-10-CM | POA: Diagnosis not present

## 2020-01-17 DIAGNOSIS — Z880 Allergy status to penicillin: Secondary | ICD-10-CM | POA: Diagnosis not present

## 2020-01-17 DIAGNOSIS — K5190 Ulcerative colitis, unspecified, without complications: Secondary | ICD-10-CM | POA: Diagnosis present

## 2020-01-17 DIAGNOSIS — K51919 Ulcerative colitis, unspecified with unspecified complications: Secondary | ICD-10-CM | POA: Diagnosis not present

## 2020-01-17 HISTORY — DX: Sepsis, unspecified organism: R65.20

## 2020-01-17 HISTORY — DX: Thrombocytopenia, unspecified: D69.6

## 2020-01-17 LAB — CBC WITH DIFFERENTIAL/PLATELET
Abs Immature Granulocytes: 0.07 10*3/uL (ref 0.00–0.07)
Basophils Absolute: 0 10*3/uL (ref 0.0–0.1)
Basophils Relative: 0 %
Eosinophils Absolute: 0 10*3/uL (ref 0.0–0.5)
Eosinophils Relative: 0 %
HCT: 40.6 % (ref 39.0–52.0)
Hemoglobin: 12.9 g/dL — ABNORMAL LOW (ref 13.0–17.0)
Immature Granulocytes: 1 %
Lymphocytes Relative: 9 %
Lymphs Abs: 0.8 10*3/uL (ref 0.7–4.0)
MCH: 30.9 pg (ref 26.0–34.0)
MCHC: 31.8 g/dL (ref 30.0–36.0)
MCV: 97.1 fL (ref 80.0–100.0)
Monocytes Absolute: 0.1 10*3/uL (ref 0.1–1.0)
Monocytes Relative: 1 %
Neutro Abs: 8.2 10*3/uL — ABNORMAL HIGH (ref 1.7–7.7)
Neutrophils Relative %: 89 %
Platelets: 128 10*3/uL — ABNORMAL LOW (ref 150–400)
RBC: 4.18 MIL/uL — ABNORMAL LOW (ref 4.22–5.81)
RDW: 14.9 % (ref 11.5–15.5)
WBC: 9.2 10*3/uL (ref 4.0–10.5)
nRBC: 0 % (ref 0.0–0.2)

## 2020-01-17 LAB — CBC
HCT: 38.4 % — ABNORMAL LOW (ref 39.0–52.0)
Hemoglobin: 12.1 g/dL — ABNORMAL LOW (ref 13.0–17.0)
MCH: 31.8 pg (ref 26.0–34.0)
MCHC: 31.5 g/dL (ref 30.0–36.0)
MCV: 100.8 fL — ABNORMAL HIGH (ref 80.0–100.0)
Platelets: 129 10*3/uL — ABNORMAL LOW (ref 150–400)
RBC: 3.81 MIL/uL — ABNORMAL LOW (ref 4.22–5.81)
RDW: 15.3 % (ref 11.5–15.5)
WBC: 20.7 10*3/uL — ABNORMAL HIGH (ref 4.0–10.5)
nRBC: 0 % (ref 0.0–0.2)

## 2020-01-17 LAB — CREATININE, SERUM
Creatinine, Ser: 2.04 mg/dL — ABNORMAL HIGH (ref 0.61–1.24)
GFR calc Af Amer: 41 mL/min — ABNORMAL LOW (ref >60)
GFR calc non Af Amer: 35 mL/min — ABNORMAL LOW (ref >60)

## 2020-01-17 LAB — COMPREHENSIVE METABOLIC PANEL
ALT: 64 U/L — ABNORMAL HIGH (ref 0–44)
AST: 77 U/L — ABNORMAL HIGH (ref 15–41)
Albumin: 3.9 g/dL (ref 3.5–5.0)
Alkaline Phosphatase: 83 U/L (ref 38–126)
Anion gap: 12 (ref 5–15)
BUN: 19 mg/dL (ref 6–20)
CO2: 20 mmol/L — ABNORMAL LOW (ref 22–32)
Calcium: 9.7 mg/dL (ref 8.9–10.3)
Chloride: 108 mmol/L (ref 98–111)
Creatinine, Ser: 1.58 mg/dL — ABNORMAL HIGH (ref 0.61–1.24)
GFR calc Af Amer: 56 mL/min — ABNORMAL LOW (ref >60)
GFR calc non Af Amer: 48 mL/min — ABNORMAL LOW (ref >60)
Glucose, Bld: 154 mg/dL — ABNORMAL HIGH (ref 70–99)
Potassium: 3.8 mmol/L (ref 3.5–5.1)
Sodium: 140 mmol/L (ref 135–145)
Total Bilirubin: 1.5 mg/dL — ABNORMAL HIGH (ref 0.3–1.2)
Total Protein: 7.1 g/dL (ref 6.5–8.1)

## 2020-01-17 LAB — SARS CORONAVIRUS 2 BY RT PCR (HOSPITAL ORDER, PERFORMED IN ~~LOC~~ HOSPITAL LAB): SARS Coronavirus 2: NEGATIVE

## 2020-01-17 LAB — URINALYSIS, ROUTINE W REFLEX MICROSCOPIC
Bilirubin Urine: NEGATIVE
Glucose, UA: NEGATIVE mg/dL
Ketones, ur: NEGATIVE mg/dL
Nitrite: NEGATIVE
Protein, ur: 30 mg/dL — AB
Specific Gravity, Urine: 1.006 (ref 1.005–1.030)
pH: 5 (ref 5.0–8.0)

## 2020-01-17 LAB — LACTIC ACID, PLASMA
Lactic Acid, Venous: 3 mmol/L (ref 0.5–1.9)
Lactic Acid, Venous: 2.6 mmol/L (ref 0.5–1.9)
Lactic Acid, Venous: 2.6 mmol/L (ref 0.5–1.9)

## 2020-01-17 LAB — LIPASE, BLOOD: Lipase: 34 U/L (ref 11–51)

## 2020-01-17 LAB — PROTIME-INR
INR: 0.9 (ref 0.8–1.2)
Prothrombin Time: 11.7 seconds (ref 11.4–15.2)

## 2020-01-17 LAB — APTT: aPTT: 27 seconds (ref 24–36)

## 2020-01-17 LAB — PROCALCITONIN: Procalcitonin: 24.62 ng/mL

## 2020-01-17 MED ORDER — PANTOPRAZOLE SODIUM 40 MG PO TBEC
40.0000 mg | DELAYED_RELEASE_TABLET | Freq: Every day | ORAL | Status: DC
  Administered 2020-01-18 – 2020-01-24 (×7): 40 mg via ORAL
  Filled 2020-01-17 (×7): qty 1

## 2020-01-17 MED ORDER — FISH OIL 1000 MG PO CAPS: 1.0000 | ORAL_CAPSULE | Freq: Two times a day (BID) | ORAL | Status: DC

## 2020-01-17 MED ORDER — VANCOMYCIN HCL 1750 MG/350ML IV SOLN
1750.0000 mg | Freq: Once | INTRAVENOUS | Status: AC
  Administered 2020-01-17: 1750 mg via INTRAVENOUS
  Filled 2020-01-17: qty 350

## 2020-01-17 MED ORDER — EZETIMIBE 10 MG PO TABS
10.0000 mg | ORAL_TABLET | Freq: Every day | ORAL | Status: DC
  Filled 2020-01-17: qty 1

## 2020-01-17 MED ORDER — ACETAMINOPHEN 325 MG PO TABS
650.0000 mg | ORAL_TABLET | Freq: Once | ORAL | Status: AC
  Administered 2020-01-17: 650 mg via ORAL
  Filled 2020-01-17: qty 2

## 2020-01-17 MED ORDER — ASCORBIC ACID 500 MG PO TABS
500.0000 mg | ORAL_TABLET | Freq: Every day | ORAL | Status: DC
  Administered 2020-01-18 – 2020-01-24 (×7): 500 mg via ORAL
  Filled 2020-01-17 (×7): qty 1

## 2020-01-17 MED ORDER — ONDANSETRON HCL 4 MG PO TABS: 4.0000 mg | ORAL_TABLET | Freq: Four times a day (QID) | ORAL | Status: DC | PRN

## 2020-01-17 MED ORDER — GABAPENTIN 300 MG PO CAPS
300.0000 mg | ORAL_CAPSULE | Freq: Two times a day (BID) | ORAL | Status: DC
  Administered 2020-01-17 – 2020-01-24 (×14): 300 mg via ORAL
  Filled 2020-01-17 (×14): qty 1

## 2020-01-17 MED ORDER — HYDRALAZINE HCL 25 MG PO TABS
25.0000 mg | ORAL_TABLET | Freq: Two times a day (BID) | ORAL | Status: DC
  Administered 2020-01-17: 25 mg via ORAL
  Filled 2020-01-17: qty 1

## 2020-01-17 MED ORDER — METRONIDAZOLE IN NACL 5-0.79 MG/ML-% IV SOLN
500.0000 mg | Freq: Once | INTRAVENOUS | Status: AC
  Administered 2020-01-17: 500 mg via INTRAVENOUS
  Filled 2020-01-17: qty 100

## 2020-01-17 MED ORDER — VITAMIN D3 1.25 MG (50000 UT) PO CAPS: 50000.0000 [IU] | ORAL_CAPSULE | ORAL | Status: DC

## 2020-01-17 MED ORDER — ATENOLOL 25 MG PO TABS
25.0000 mg | ORAL_TABLET | Freq: Every day | ORAL | Status: DC
  Filled 2020-01-17: qty 1

## 2020-01-17 MED ORDER — VANCOMYCIN HCL 1250 MG/250ML IV SOLN: 1250.0000 mg | INTRAVENOUS | Status: DC

## 2020-01-17 MED ORDER — ONDANSETRON HCL 4 MG/2ML IJ SOLN
4.0000 mg | Freq: Once | INTRAMUSCULAR | Status: AC
  Administered 2020-01-17: 4 mg via INTRAVENOUS
  Filled 2020-01-17: qty 2

## 2020-01-17 MED ORDER — VITAMIN D (ERGOCALCIFEROL) 1.25 MG (50000 UNIT) PO CAPS
50000.0000 [IU] | ORAL_CAPSULE | ORAL | Status: DC
  Administered 2020-01-19: 50000 [IU] via ORAL
  Filled 2020-01-17: qty 1

## 2020-01-17 MED ORDER — SODIUM CHLORIDE 0.9 % IV BOLUS: 1000.0000 mL | Freq: Once | INTRAVENOUS | Status: DC

## 2020-01-17 MED ORDER — HYDROMORPHONE HCL 1 MG/ML IJ SOLN
0.5000 mg | INTRAMUSCULAR | Status: DC | PRN
  Administered 2020-01-17 – 2020-01-18 (×5): 0.5 mg via INTRAVENOUS
  Filled 2020-01-17 (×5): qty 1

## 2020-01-17 MED ORDER — ROSUVASTATIN CALCIUM 10 MG PO TABS
10.0000 mg | ORAL_TABLET | Freq: Every day | ORAL | Status: DC
  Filled 2020-01-17: qty 1

## 2020-01-17 MED ORDER — PSYLLIUM 95 % PO PACK
1.0000 | PACK | Freq: Every day | ORAL | Status: DC | PRN
  Filled 2020-01-17: qty 1

## 2020-01-17 MED ORDER — SODIUM CHLORIDE 0.9 % IV SOLN: INTRAVENOUS | Status: AC

## 2020-01-17 MED ORDER — SODIUM CHLORIDE 0.9 % IV SOLN
2.0000 g | Freq: Three times a day (TID) | INTRAVENOUS | Status: DC
  Administered 2020-01-18: 2 g via INTRAVENOUS
  Filled 2020-01-17: qty 2

## 2020-01-17 MED ORDER — OMEGA-3-ACID ETHYL ESTERS 1 G PO CAPS
1.0000 g | ORAL_CAPSULE | Freq: Two times a day (BID) | ORAL | Status: DC
  Administered 2020-01-17 – 2020-01-24 (×14): 1 g via ORAL
  Filled 2020-01-17 (×14): qty 1

## 2020-01-17 MED ORDER — LACTATED RINGERS IV BOLUS
500.0000 mL | Freq: Once | INTRAVENOUS | Status: AC
  Administered 2020-01-17: 500 mL via INTRAVENOUS

## 2020-01-17 MED ORDER — VANCOMYCIN HCL IN DEXTROSE 1-5 GM/200ML-% IV SOLN: 1000.0000 mg | Freq: Once | INTRAVENOUS | Status: DC

## 2020-01-17 MED ORDER — ENOXAPARIN SODIUM 40 MG/0.4ML ~~LOC~~ SOLN
40.0000 mg | SUBCUTANEOUS | Status: DC
  Administered 2020-01-18 – 2020-01-23 (×6): 40 mg via SUBCUTANEOUS
  Filled 2020-01-17 (×5): qty 0.4

## 2020-01-17 MED ORDER — AMLODIPINE BESYLATE 5 MG PO TABS: 5.0000 mg | ORAL_TABLET | Freq: Every day | ORAL | Status: DC

## 2020-01-17 MED ORDER — ONDANSETRON HCL 4 MG/2ML IJ SOLN: 4.0000 mg | Freq: Four times a day (QID) | INTRAMUSCULAR | Status: DC | PRN

## 2020-01-17 MED ORDER — LACTATED RINGERS IV BOLUS (SEPSIS)
1000.0000 mL | Freq: Once | INTRAVENOUS | Status: AC
  Administered 2020-01-17: 1000 mL via INTRAVENOUS

## 2020-01-17 MED ORDER — PSYLLIUM 58.6 % PO PACK: 1.0000 | PACK | Freq: Every day | ORAL | Status: DC | PRN

## 2020-01-17 MED ORDER — HYDROMORPHONE HCL 1 MG/ML IJ SOLN
1.0000 mg | Freq: Once | INTRAMUSCULAR | Status: AC
  Administered 2020-01-17: 1 mg via INTRAVENOUS
  Filled 2020-01-17: qty 1

## 2020-01-17 MED ORDER — LOPERAMIDE HCL 2 MG PO CAPS
2.0000 mg | ORAL_CAPSULE | Freq: Two times a day (BID) | ORAL | Status: DC
  Administered 2020-01-17 – 2020-01-22 (×10): 2 mg via ORAL
  Filled 2020-01-17 (×9): qty 1

## 2020-01-17 MED ORDER — SODIUM CHLORIDE 0.9 % IV SOLN
2.0000 g | Freq: Once | INTRAVENOUS | Status: AC
  Administered 2020-01-17: 2 g via INTRAVENOUS
  Filled 2020-01-17: qty 2

## 2020-01-17 MED ORDER — HYDROCORTISONE NA SUCCINATE PF 100 MG IJ SOLR
50.0000 mg | Freq: Three times a day (TID) | INTRAMUSCULAR | Status: DC
  Administered 2020-01-17 – 2020-01-19 (×5): 50 mg via INTRAVENOUS
  Filled 2020-01-17: qty 1
  Filled 2020-01-17 (×4): qty 2
  Filled 2020-01-17: qty 1

## 2020-01-17 MED ORDER — DICYCLOMINE HCL 20 MG PO TABS
20.0000 mg | ORAL_TABLET | Freq: Two times a day (BID) | ORAL | Status: DC | PRN
  Administered 2020-01-18 – 2020-01-24 (×5): 20 mg via ORAL
  Filled 2020-01-17 (×8): qty 1

## 2020-01-17 MED ORDER — VITAMIN B-12 100 MCG PO TABS
100.0000 ug | ORAL_TABLET | Freq: Every day | ORAL | Status: DC
  Administered 2020-01-18 – 2020-01-24 (×7): 100 ug via ORAL
  Filled 2020-01-17 (×7): qty 1

## 2020-01-17 MED ORDER — ADULT MULTIVITAMIN W/MINERALS CH
1.0000 | ORAL_TABLET | Freq: Every day | ORAL | Status: DC
  Administered 2020-01-18 – 2020-01-24 (×7): 1 via ORAL
  Filled 2020-01-17 (×7): qty 1

## 2020-01-17 NOTE — ED Triage Notes (Signed)
Pt arrived via EMS c/o lower back pain and malodorous urine since yesterday, back pain radiating to abd.   130 HR  36 RR 99.9 temp

## 2020-01-17 NOTE — H&P (Addendum)
History and Physical    Matthew Francis [id number] DOB: [birth year] DOA: 01/17/2020  PCP: Charline Bills, MD  Patient coming from: Home.  Chief Complaint: Back pain and fever chills.  HPI: Matthew Francis is a 59 y.o. male with history of ulcerative colitis with total colectomy in 9532 complicated by pouchitis for which patient was recently started on prednisone by patient's gastroenterologist in [facility].  Patient is also on Cipro for the same reason.  Start developing fever chills and low back pain since this morning.  Pain was coming in waves with radiation to the lower abdomen.  Given the symptom the patient decided to come to the ER.  Patient did not have any productive cough vomiting or chest pain.  ED Course: In the ER patient had a fever 100.3 with lactic acid of 3 labs show platelets of 128 which appears to be new when compared to his old labs AST and ALT were elevated at 77 and 64 total bili 1.5 creatinine 1.5 UA is consistent with UTI.  CT abdomen pelvis done without contrast shows perinephric stranding.  Patient was started on empiric antibiotics for sepsis secondary UTI.  EKG shows sinus tachycardia.  Covid test was negative.  Review of Systems: As per HPI, rest all negative.   Past Medical History:  Diagnosis Date  . Arthritis   . GERD (gastroesophageal reflux disease)   . H/O ulcerative colitis   . Hyperlipidemia   . Hypertension   . Inguinal hernia   . Ulcer    ulcerative colitis  . Ulcerative colitis   . Wears glasses     Past Surgical History:  Procedure Laterality Date  . APPENDECTOMY    . COLECTOMY     Proctocolectomy with IPAA  . COLECTOMY    . FOREIGN BODY REMOVAL ABDOMINAL  02/17/2012   Procedure: REMOVAL FOREIGN BODY ABDOMINAL;  Surgeon: Odis Hollingshead, MD;  Location: [facility];  Service: General;  Laterality: N/A;  abdominal wound exploration and removal of foreign body  . HERNIA REPAIR     Multiple incisional hernias.  .  ileoanal anastomosis       reports that he has never smoked. He has never used smokeless tobacco. He reports current alcohol use of about 4.0 standard drinks of alcohol per week. He reports that he does not use drugs.  Allergies  Allergen Reactions  . Asacol [Mesalamine] Swelling    Throat   . Penicillins Anaphylaxis    Has patient had a PCN reaction causing immediate rash, facial/tongue/throat swelling, SOB or lightheadedness with hypotension: Yes Has patient had a PCN reaction causing severe rash involving mucus membranes or skin necrosis: No Has patient had a PCN reaction that required hospitalization: Yes Has patient had a PCN reaction occurring within the last 10 years: No If all of the above answers are "NO", then may proceed with Cephalosporin use.  . Sulfa Antibiotics Anaphylaxis  . Bee Venom Hives and Swelling  . Iodinated Diagnostic Agents     Pt reports allergy at [place]. Rapid heartbeat and dyspnea    Family History  Problem Relation Age of Onset  . Cancer Sister     Prior to Admission medications   Medication Sig Start Date End Date Taking? Authorizing Provider  acetaminophen (TYLENOL) 500 MG tablet Take 500 mg by mouth every 6 (six) hours as needed for mild pain.    Yes [provider]  amLODipine (NORVASC) 5 MG tablet Take 5 mg by mouth  daily. 02/19/18  Yes [provider]  ascorbic acid (VITAMIN C) 500 MG tablet Take 500 mg by mouth daily.   Yes [provider]  atenolol (TENORMIN) 25 MG tablet Take 1 tablet by mouth daily.   Yes [provider]  cetirizine (ZYRTEC) 10 MG tablet Take 10 mg by mouth daily as needed for allergies.    Yes [provider]  Cholecalciferol (VITAMIN D3) 50000 units CAPS Take 50,000 Units by mouth once a week. 03/05/18  Yes [provider]  ciprofloxacin (CIPRO) 500 MG tablet Take 1 tablet (500 mg total) by mouth 2 (two) times daily. Take twice daily for one week, then resume once  daily. Patient takes this on a regular basis has been on it for years uses it as an anti inflammatory 03/31/18  Yes Valere Dross, Alyssa B, PA-C  dicyclomine (BENTYL) 20 MG tablet Take 1 tablet (20 mg total) by mouth 2 (two) times daily as needed for spasms. 06/14/18  Yes Caccavale, Sophia, PA-C  esomeprazole (NEXIUM) 20 MG capsule Take 20 mg by mouth daily.    Yes [provider]  ezetimibe (ZETIA) 10 MG tablet Take 10 mg by mouth daily. 01/06/20  Yes [provider]  gabapentin (NEURONTIN) 300 MG capsule Take 300 mg by mouth 2 (two) times daily.  11/28/19  Yes [provider]  Glucosamine HCl (GLUCOSAMINE PO) Take 1 tablet by mouth daily.   Yes [provider]  hydrALAZINE (APRESOLINE) 25 MG tablet Take 25 mg by mouth in the morning and at bedtime. 10/10/19  Yes [provider]  loperamide (IMODIUM A-D) 2 MG tablet Take 2 mg by mouth 2 (two) times daily.    Yes [provider]  Multiple Vitamin (MULTIVITAMIN WITH MINERALS) TABS Take 1 tablet by mouth daily.   Yes [provider]  Omega-3 Fatty Acids (FISH OIL) 1000 MG CAPS Take 1 capsule by mouth 2 (two) times daily.   Yes [provider]  predniSONE (DELTASONE) 10 MG tablet Take 10 mg by mouth See admin instructions. Take Prednisone 40 mg daily by mouth for 2 weeks, then 30 mg, 20 mg, 15 mg, 10 mg, 5 mg, daily each for 1 week. Patient sates he is on the 30 mg course as of today 01-17-20 12/28/19  Yes [provider]  psyllium (METAMUCIL) 58.6 % packet Take 1 packet by mouth daily as needed (For fiber).    Yes [provider]  rosuvastatin (CRESTOR) 10 MG tablet Take 1 tablet by mouth daily.  12/08/19  Yes [provider]  Turmeric (QC TUMERIC COMPLEX PO) Take 1 tablet by mouth daily as needed (For inflammatory).   Yes [provider]  vitamin B-12 (CYANOCOBALAMIN) 100 MCG tablet Take 100 mcg by mouth daily.   Yes [provider]    HYDROcodone-acetaminophen (NORCO/VICODIN) 5-325 MG tablet Take 1 tablet by mouth every 6 (six) hours as needed. Patient not taking: Reported on 01/17/2020 03/31/18   Langston Masker B, PA-C  omeprazole (PRILOSEC) 20 MG capsule Take 20 mg by mouth daily. Patient not taking: Reported on 01/17/2020 03/24/18   [provider]  oxyCODONE-acetaminophen (PERCOCET/ROXICET) 5-325 MG per tablet Take 1 tablet by mouth every 6 (six) hours as needed for pain. Patient not taking: Reported on 03/31/2018 03/02/12   Dalia Heading, PA-C  promethazine (PHENERGAN) 25 MG tablet Take 1 tablet (25 mg total) by mouth every 6 (six) hours as needed for nausea. Patient not taking: Reported on 03/31/2018 03/02/12   Dalia Heading,  PA-C    Physical Exam: Constitutional: Moderately built and nourished. Vitals:   01/17/20 1745 01/17/20 1830 01/17/20 2000 01/17/20 2130  BP: 129/77 (!) 144/94 (!) 132/91 (!) 149/100  Pulse: (!) 124 (!) 114 (!) 103 93  Resp: 18 (!) 21 18 (!) 25  Temp:      TempSrc:      SpO2: 91% 94% 97% 95%   Eyes: Anicteric no pallor. ENMT: No discharge from the ears eyes nose or mouth. Neck: No mass felt.  No neck rigidity. Respiratory: No rhonchi or crepitations. Cardiovascular: S1-S2 heard. Abdomen: Soft nontender bowel sounds present. Musculoskeletal: No edema. Skin: No rash. Neurologic: Alert awake oriented to time place and person.  Moves all extremities. Psychiatric: Appears normal.  Normal affect.   Labs on Admission: I have personally reviewed following labs and imaging studies  CBC: Recent Labs  Lab 01/17/20 1547  WBC 9.2  NEUTROABS 8.2*  HGB 12.9*  HCT 40.6  MCV 97.1  PLT 528*   Basic Metabolic Panel: Recent Labs  Lab 01/17/20 1547  NA 140  K 3.8  CL 108  CO2 20*  GLUCOSE 154*  BUN 19  CREATININE 1.58*  CALCIUM 9.7   GFR: CrCl cannot be calculated (Unknown ideal weight.). Liver Function Tests: Recent Labs  Lab 01/17/20 1547  AST 77*  ALT 64*   ALKPHOS 83  BILITOT 1.5*  PROT 7.1  ALBUMIN 3.9   Recent Labs  Lab 01/17/20 1548  LIPASE 34   No results for input(s): AMMONIA in the last 168 hours. Coagulation Profile: Recent Labs  Lab 01/17/20 1547  INR 0.9   Cardiac Enzymes: No results for input(s): CKTOTAL, CKMB, CKMBINDEX, TROPONINI in the last 168 hours. BNP (last 3 results) No results for input(s): PROBNP in the last 8760 hours. HbA1C: No results for input(s): HGBA1C in the last 72 hours. CBG: No results for input(s): GLUCAP in the last 168 hours. Lipid Profile: No results for input(s): CHOL, HDL, LDLCALC, TRIG, CHOLHDL, LDLDIRECT in the last 72 hours. Thyroid Function Tests: No results for input(s): TSH, T4TOTAL, FREET4, T3FREE, THYROIDAB in the last 72 hours. Anemia Panel: No results for input(s): VITAMINB12, FOLATE, FERRITIN, TIBC, IRON, RETICCTPCT in the last 72 hours. Urine analysis:    Component Value Date/Time   COLORURINE YELLOW 01/17/2020 1547   APPEARANCEUR CLEAR 01/17/2020 1547   LABSPEC 1.006 01/17/2020 1547   PHURINE 5.0 01/17/2020 1547   GLUCOSEU NEGATIVE 01/17/2020 1547   HGBUR LARGE (A) 01/17/2020 1547   BILIRUBINUR NEGATIVE 01/17/2020 1547   KETONESUR NEGATIVE 01/17/2020 1547   PROTEINUR 30 (A) 01/17/2020 1547   UROBILINOGEN 0.2 01/29/2012 1733   NITRITE NEGATIVE 01/17/2020 1547   LEUKOCYTESUR MODERATE (A) 01/17/2020 1547   Sepsis Labs: @LABRCNTIP (procalcitonin:4,lacticidven:4) ) Recent Results (from the past 240 hour(s))  SARS Coronavirus 2 by RT PCR (hospital order, performed in [place] hospital lab) Nasopharyngeal Nasopharyngeal Swab     Status: None   Collection Time: 01/17/20  6:40 PM   Specimen: Nasopharyngeal Swab  Result Value Ref Range Status   SARS Coronavirus 2 NEGATIVE NEGATIVE Final    Comment: (NOTE) SARS-CoV-2 target nucleic acids are NOT DETECTED.  The SARS-CoV-2 RNA is generally detectable in upper and lower respiratory specimens during the acute phase of  infection. The lowest concentration of SARS-CoV-2 viral copies this assay can detect is 250 copies / mL. A negative result does not preclude SARS-CoV-2 infection and should not be used as the sole basis for treatment or other patient management decisions.  A negative result may occur with improper specimen collection / handling, submission of specimen other than nasopharyngeal swab, presence of viral mutation(s) within the areas targeted by this assay, and inadequate number of viral copies (<250 copies / mL). A negative result must be combined with clinical observations, patient history, and epidemiological information.  Fact Sheet for Patients:   StrictlyIdeas.no  Fact Sheet for Healthcare Providers: BankingDealers.co.za  This test is not yet approved or  cleared by the [place] FDA and has been authorized for detection and/or diagnosis of SARS-CoV-2 by FDA under an Emergency Use Authorization (EUA).  This EUA will remain in effect (meaning this test can be used) for the duration of the COVID-19 declaration under Section 564(b)(1) of the Act, 21 U.S.C. section 360bbb-3(b)(1), unless the authorization is terminated or revoked sooner.  Performed at [facility], [place] [address] [place], [place] [address]      Radiological Exams on Admission: CT ABDOMEN PELVIS WO CONTRAST  Result Date: 01/17/2020 CLINICAL DATA:  59 year old male with sepsis. EXAM: CT ABDOMEN AND PELVIS WITHOUT CONTRAST TECHNIQUE: Multidetector CT imaging of the abdomen and pelvis was performed following the standard protocol without IV contrast. COMPARISON:  CT abdomen pelvis dated 01/29/2012. FINDINGS: Evaluation of this exam is limited in the absence of intravenous contrast. Lower chest: The visualized lung bases are clear. No intra-abdominal free air or free fluid. Hepatobiliary: No focal liver abnormality is seen. No gallstones, gallbladder wall  thickening, or biliary dilatation. Pancreas: Unremarkable. No pancreatic ductal dilatation or surrounding inflammatory changes. Spleen: Normal in size without focal abnormality. Adrenals/Urinary Tract: The adrenal glands unremarkable. There is no hydronephrosis or nephrolithiasis on either side. Mild bilateral perinephric stranding, nonspecific. Correlation with urinalysis recommended to exclude UTI. The visualized ureters and urinary bladder appear unremarkable. Stomach/Bowel: Postsurgical changes of colectomy. There is no bowel obstruction or active inflammation. Vascular/Lymphatic: The abdominal aorta and IVC are unremarkable. No portal venous gas. There is no adenopathy. Reproductive: The prostate and seminal vesicles are grossly unremarkable. Other: Midline vertical anterior abdominal wall incisional scar. Musculoskeletal: No acute or significant osseous findings. IMPRESSION: 1. No acute intra-abdominopelvic pathology. 2. Mild nonspecific perinephric stranding. Correlation with urinalysis recommended to exclude UTI. 3. Postsurgical changes of colectomy. No bowel obstruction. Electronically Signed   By: Anner Crete M.D.   On: 01/17/2020 17:53   DG Chest 2 View  Result Date: 01/17/2020 CLINICAL DATA:  59 year old male with sepsis. EXAM: CHEST - 2 VIEW COMPARISON:  Chest radiograph dated 12/25/2004. FINDINGS: Increased density over the posterior lower lobes, seen on the lateral view, may be artifactual and related to superimposition of the soft tissues or atelectasis. Infiltrate is less likely but not excluded. Clinical correlation is recommended. No lobar consolidation, pleural effusion, or pneumothorax. Top-normal cardiac silhouette. No acute osseous pathology. IMPRESSION: Atelectasis versus developing infiltrate involving the posterior lower lobes. Clinical correlation recommended. Electronically Signed   By: Anner Crete M.D.   On: 01/17/2020 16:05    EKG: Independently reviewed.  Sinus  tachycardia.  Assessment/Plan Principal Problem:   Sepsis ([place]) Active Problems:   Acute lower UTI   ARF (acute renal failure) (HCC)   Thrombocytopenia (HCC)   Essential hypertension   Ulcerative colitis ([place])    1. Sepsis secondary to urinary tract infection for which patient is placed on empiric antibiotics follow cultures continue hydration follow procalcitonin lactic acid levels. 2. Acute renal failure likely from sepsis.  Continue with aggressive hydration follow intake output metabolic panel. 3. Hypertension recently started on hydralazine  and will continue patient's beta-blockers and amlodipine. 4. History of ulcerative colitis status post total colectomy with pouchitis presently started on prednisone for which I will keep patient on IV hydrocortisone as stress dose.  Patient also on scheduled dose of Imodium which will be continued.  Patient also started on Cipro presently on empiric antibiotics for sepsis. 5. Hyperglycemia likely from recent starting of prednisone. 6. Anemia appears to be chronic follow CBC. 7. Thrombocytopenia appears to be new likely from septic picture.  Follow CBC closely. 8. Elevated LFTs could be from sepsis.  Will hold statins and Zetia for now.  If there is no further worsening may restart statins. 9. Non-anion gap metabolic acidosis likely from sepsis.  Follow metabolic panel.  Since patient has septic picture on presentation will need more than 2 midnight stay in inpatient status.   DVT prophylaxis: Lovenox. Code Status: Full code. Family Communication: Discussed with patient. Disposition Plan: Home. Consults called: None. Admission status: Inpatient.   Rise Patience MD [facility] Pager [phone].  If 7PM-7AM, please contact night-coverage [email or web address] Password [facility]  01/17/2020, 10:21 PM

## 2020-01-17 NOTE — Sepsis Progress Note (Signed)
Notified bedside nurse of need to administer antibiotics and fluid bolus.

## 2020-01-17 NOTE — Progress Notes (Addendum)
A consult was received from an ED physician for cefepime per pharmacy dosing.  The patient's profile has been reviewed for ht/wt/allergies/indication/available labs.   A one time order has been placed for cefepime 2 g per EDP. Will order vancomyin 1750 mg iv once (previouis ht: 70 in. Wt 93 kg).  Further antibiotics/pharmacy consults should be ordered by admitting physician if indicated.                       Thank you, Napoleon Form 01/17/2020  3:48 PM

## 2020-01-17 NOTE — Progress Notes (Signed)
Notified bedside nurse of need to draw repeat lactic acid. 

## 2020-01-17 NOTE — Progress Notes (Addendum)
Pharmacy Antibiotic Note  Matthew Francis is a 59 y.o. male admitted on 01/17/2020 with sepsis-presumed urinary source.  Pharmacy has been consulted for Cefepime dosing.  Tm 1002.F WBC WNL, LA elevated Scr slightly elevated from patient's baseline (~1.2) Unknown source- imaging, cx data pending  Plan: Cefepime 2gm IV q12h Monitor renal function and cx data    Height: 5' 10.75" (179.7 cm) Weight: 104.3 kg (230 lb) IBW/kg (Calculated) : 74.73  Temp (24hrs), Avg:100.2 F (37.9 C), Min:100.2 F (37.9 C), Max:100.2 F (37.9 C)  Recent Labs  Lab 01/17/20 1547 01/17/20 1800  WBC 9.2  --   CREATININE 1.58*  --   LATICACIDVEN 3.0* 2.6*    Estimated Creatinine Clearance: 63.9 mL/min (A) (by C-G formula based on SCr of 1.58 mg/dL (H)).    Allergies  Allergen Reactions  . Asacol [Mesalamine] Swelling    Throat   . Penicillins Anaphylaxis    Has patient had a PCN reaction causing immediate rash, facial/tongue/throat swelling, SOB or lightheadedness with hypotension: Yes Has patient had a PCN reaction causing severe rash involving mucus membranes or skin necrosis: No Has patient had a PCN reaction that required hospitalization: Yes Has patient had a PCN reaction occurring within the last 10 years: No If all of the above answers are "NO", then may proceed with Cephalosporin use.  . Sulfa Antibiotics Anaphylaxis  . Bee Venom Hives and Swelling  . Iodinated Diagnostic Agents     Pt reports allergy at [place]. Rapid heartbeat and dyspnea    Antimicrobials this admission: 8/3 Cefepime >>  8/3 Vancomycin x1 8/3 Flagyl x1  Dose adjustments this admission:  Microbiology results: 8/3 BCx:  8/3 UCx:   8/3 COVID: negative  Thank you for allowing pharmacy to be a part of this patient's care.  Netta Cedars PharmD, BCPS 01/17/2020 11:02 PM

## 2020-01-17 NOTE — Progress Notes (Signed)
Notified bedside nurse of need to administer antibiotics.  

## 2020-01-17 NOTE — ED Notes (Signed)
Lactic acid 3.0

## 2020-01-17 NOTE — ED Provider Notes (Addendum)
[facility] DEPT Provider Note   CSN: [id number] Arrival date & time: 01/17/20  1458     History No chief complaint on file.   Matthew Francis is a 59 y.o. 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 Back pain "where my kidneys are".  Was initially mild, laid down and it felt better. Woke up this morning and it was severe. Pain is constant but intermittently spasm like comes in waves, radiates to lower abdomen.  Associated with subjective fevers, chills, nausea.  Reports changes in stools.  Usually stools are mushy with occasional blood but states stools now are watery with small amount of blood.  Also reports urine has felt more warm than usual with some cloudiness.  Denies cough, chest pain or SOB. Fully vaccinated for COVID. Of note patient seen last month by GI for "pouchitis", he was started on prednisone taper.  Scheduled to have repeat pouchoscopy on 8/13.   HPI     Past Medical History:  Diagnosis Date  . Arthritis   . GERD (gastroesophageal reflux disease)   . H/O ulcerative colitis   . Hyperlipidemia   . Hypertension   . Inguinal hernia   . Ulcer    ulcerative colitis  . Ulcerative colitis   . Wears glasses     Patient Active Problem List   Diagnosis Date Noted  . 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 02/09/2012    Past Surgical History:  Procedure Laterality Date  . APPENDECTOMY    . COLECTOMY     Proctocolectomy with IPAA  . COLECTOMY    . FOREIGN BODY REMOVAL ABDOMINAL  02/17/2012   Procedure: REMOVAL FOREIGN BODY ABDOMINAL;  Surgeon: Odis Hollingshead, MD;  Location: [facility];  Service: General;  Laterality: N/A;  abdominal wound exploration and removal of foreign body  . HERNIA REPAIR     Multiple incisional hernias.  . ileoanal anastomosis         Family  History  Problem Relation Age of Onset  . Cancer Sister     Social History   Tobacco Use  . Smoking status: Never Smoker  . Smokeless tobacco: Never Used  Substance Use Topics  . Alcohol use: Yes    Alcohol/week: 4.0 standard drinks    Types: 4 Cans of beer per week    Comment: 4 cans of beer per week  . Drug use: No    Home Medications Prior to Admission medications   Medication Sig Start Date End Date Taking? Authorizing Provider  acetaminophen (TYLENOL) 500 MG tablet Take 500 mg by mouth every 6 (six) hours as needed for mild pain.    Yes [provider]  amLODipine (NORVASC) 5 MG tablet Take 5 mg by mouth daily. 02/19/18  Yes [provider]  ascorbic acid (VITAMIN C) 500 MG tablet Take 500 mg by mouth daily.   Yes [provider]  atenolol (TENORMIN) 25 MG tablet Take 1 tablet by mouth daily.   Yes [provider]  cetirizine (ZYRTEC) 10 MG tablet Take 10 mg by mouth daily as needed for allergies.    Yes [provider]  Cholecalciferol (VITAMIN D3) 50000 units CAPS Take 50,000 Units by mouth once a week. 03/05/18  Yes [provider]  ciprofloxacin (CIPRO) 500 MG tablet Take 1 tablet (500 mg total) by mouth 2 (  two) times daily. Take twice daily for one week, then resume once daily. Patient takes this on a regular basis has been on it for years uses it as an anti inflammatory 03/31/18  Yes Valere Dross, Alyssa B, PA-C  dicyclomine (BENTYL) 20 MG tablet Take 1 tablet (20 mg total) by mouth 2 (two) times daily as needed for spasms. 06/14/18  Yes Caccavale, Sophia, PA-C  esomeprazole (NEXIUM) 20 MG capsule Take 20 mg by mouth daily.    Yes [provider]  ezetimibe (ZETIA) 10 MG tablet Take 10 mg by mouth daily. 01/06/20  Yes [provider]  gabapentin (NEURONTIN) 300 MG capsule Take 300 mg by mouth 2 (two) times daily.  11/28/19  Yes [provider]  Glucosamine HCl (GLUCOSAMINE PO) Take 1 tablet by mouth  daily.   Yes [provider]  hydrALAZINE (APRESOLINE) 25 MG tablet Take 25 mg by mouth in the morning and at bedtime. 10/10/19  Yes [provider]  loperamide (IMODIUM A-D) 2 MG tablet Take 2 mg by mouth 2 (two) times daily.    Yes [provider]  Multiple Vitamin (MULTIVITAMIN WITH MINERALS) TABS Take 1 tablet by mouth daily.   Yes [provider]  Omega-3 Fatty Acids (FISH OIL) 1000 MG CAPS Take 1 capsule by mouth 2 (two) times daily.   Yes [provider]  predniSONE (DELTASONE) 10 MG tablet Take 10 mg by mouth See admin instructions. Take Prednisone 40 mg daily by mouth for 2 weeks, then 30 mg, 20 mg, 15 mg, 10 mg, 5 mg, daily each for 1 week. Patient sates he is on the 30 mg course as of today 01-17-20 12/28/19  Yes [provider]  psyllium (METAMUCIL) 58.6 % packet Take 1 packet by mouth daily as needed (For fiber).    Yes [provider]  rosuvastatin (CRESTOR) 10 MG tablet Take 1 tablet by mouth daily.  12/08/19  Yes [provider]  Turmeric (QC TUMERIC COMPLEX PO) Take 1 tablet by mouth daily as needed (For inflammatory).   Yes [provider]  vitamin B-12 (CYANOCOBALAMIN) 100 MCG tablet Take 100 mcg by mouth daily.   Yes [provider]  HYDROcodone-acetaminophen (NORCO/VICODIN) 5-325 MG tablet Take 1 tablet by mouth every 6 (six) hours as needed. Patient not taking: Reported on 01/17/2020 03/31/18   Langston Masker B, PA-C  omeprazole (PRILOSEC) 20 MG capsule Take 20 mg by mouth daily. Patient not taking: Reported on 01/17/2020 03/24/18   [provider]  oxyCODONE-acetaminophen (PERCOCET/ROXICET) 5-325 MG per tablet Take 1 tablet by mouth every 6 (six) hours as needed for pain. Patient not taking: Reported on 03/31/2018 03/02/12   Dalia Heading, PA-C  promethazine (PHENERGAN) 25 MG tablet Take 1 tablet (25 mg total) by mouth every 6 (six) hours as needed for nausea. Patient not taking:  Reported on 03/31/2018 03/02/12   Dalia Heading, PA-C    Allergies    Asacol [mesalamine], Penicillins, Sulfa antibiotics, Bee venom, and Iodinated diagnostic agents  Review of Systems   Review of Systems  Constitutional: Positive for chills, diaphoresis and fever.  Gastrointestinal: Positive for abdominal pain, diarrhea and nausea.  Genitourinary: Positive for difficulty urinating.  Musculoskeletal: Positive for back pain.  Allergic/Immunologic: Positive for immunocompromised state.  All other systems reviewed and are negative.   Physical Exam Updated Vital Signs BP 129/77   Pulse (!) 124   Temp 100.2 F (37.9 C) (Oral)   Resp 18   SpO2 91%   Physical Exam  Vitals and nursing note reviewed.  Constitutional:      General: He is not in acute distress.    Appearance: He is well-developed.     Comments: Non toxic.  Wants a towel to cover his eyes from light states light is making his abdominal pain worse   HENT:     Head: Normocephalic and atraumatic.     Right Ear: External ear normal.     Left Ear: External ear normal.     Nose: Nose normal.  Eyes:     General: No scleral icterus.    Conjunctiva/sclera: Conjunctivae normal.  Cardiovascular:     Rate and Rhythm: Regular rhythm. Tachycardia present.     Heart sounds: Normal heart sounds. No murmur heard.      Comments: Sinus tachycardia on monitor.  No LE edema  Pulmonary:     Effort: Pulmonary effort is normal.     Breath sounds: Normal breath sounds.  Abdominal:     Palpations: Abdomen is soft.     Tenderness: There is abdominal tenderness. There is right CVA tenderness and left CVA tenderness.     Comments: Large ventral surgical scar noted.  Diffuse but mostly periumbilical and lower abdominal tenderness with subjective guarding. Unable to auscultate bowel sounds. Negative McBurney's. Negative Murphy's.    Musculoskeletal:        General: No deformity. Normal range of motion.     Cervical back: Normal range  of motion and neck supple.  Skin:    General: Skin is warm and dry.     Capillary Refill: Capillary refill takes less than 2 seconds.  Neurological:     Mental Status: He is alert and oriented to person, place, and time.  Psychiatric:        Behavior: Behavior normal.        Thought Content: Thought content normal.        Judgment: Judgment normal.     ED Results / Procedures / Treatments   Labs (all labs ordered are listed, but only abnormal results are displayed) Labs Reviewed  COMPREHENSIVE METABOLIC PANEL - Abnormal; Notable for the following components:      Result Value   CO2 20 (*)    Glucose, Bld 154 (*)    Creatinine, Ser 1.58 (*)    AST 77 (*)    ALT 64 (*)    Total Bilirubin 1.5 (*)    GFR calc non Af Amer 48 (*)    GFR calc Af Amer 56 (*)    All other components within normal limits  LACTIC ACID, PLASMA - Abnormal; Notable for the following components:   Lactic Acid, Venous 3.0 (*)    All other components within normal limits  CBC WITH DIFFERENTIAL/PLATELET - Abnormal; Notable for the following components:   RBC 4.18 (*)    Hemoglobin 12.9 (*)    Platelets 128 (*)    Neutro Abs 8.2 (*)    All other components within normal limits  URINALYSIS, ROUTINE W REFLEX MICROSCOPIC - Abnormal; Notable for the following components:   Hgb urine dipstick LARGE (*)    Protein, ur 30 (*)    Leukocytes,Ua MODERATE (*)    Bacteria, UA RARE (*)    All other components within normal limits  CULTURE, BLOOD (ROUTINE X 2)  CULTURE, BLOOD (ROUTINE X 2)  URINE CULTURE  SARS CORONAVIRUS 2 BY RT PCR (HOSPITAL ORDER, [facility])  C DIFFICILE QUICK SCREEN W PCR REFLEX  PROTIME-INR  APTT  LIPASE, BLOOD  LACTIC ACID, PLASMA    EKG EKG Interpretation  Date/Time:  Tuesday January 17 2020 15:30:48 EDT Ventricular Rate:  133 PR Interval:    QRS Duration: 81 QT Interval:  280 QTC Calculation: 417 R Axis:   67 Text Interpretation: Sinus tachycardia  Consider left ventricular hypertrophy Borderline T abnormalities, inferior leads SINCE LAST TRACING HEART RATE HAS INCREASED Confirmed by Malvin Johns [phone]) on 01/17/2020 5:19:09 PM   Radiology CT ABDOMEN PELVIS WO CONTRAST  Result Date: 01/17/2020 CLINICAL DATA:  59 year old male with sepsis. EXAM: CT ABDOMEN AND PELVIS WITHOUT CONTRAST TECHNIQUE: Multidetector CT imaging of the abdomen and pelvis was performed following the standard protocol without IV contrast. COMPARISON:  CT abdomen pelvis dated 01/29/2012. FINDINGS: Evaluation of this exam is limited in the absence of intravenous contrast. Lower chest: The visualized lung bases are clear. No intra-abdominal free air or free fluid. Hepatobiliary: No focal liver abnormality is seen. No gallstones, gallbladder wall thickening, or biliary dilatation. Pancreas: Unremarkable. No pancreatic ductal dilatation or surrounding inflammatory changes. Spleen: Normal in size without focal abnormality. Adrenals/Urinary Tract: The adrenal glands unremarkable. There is no hydronephrosis or nephrolithiasis on either side. Mild bilateral perinephric stranding, nonspecific. Correlation with urinalysis recommended to exclude UTI. The visualized ureters and urinary bladder appear unremarkable. Stomach/Bowel: Postsurgical changes of colectomy. There is no bowel obstruction or active inflammation. Vascular/Lymphatic: The abdominal aorta and IVC are unremarkable. No portal venous gas. There is no adenopathy. Reproductive: The prostate and seminal vesicles are grossly unremarkable. Other: Midline vertical anterior abdominal wall incisional scar. Musculoskeletal: No acute or significant osseous findings. IMPRESSION: 1. No acute intra-abdominopelvic pathology. 2. Mild nonspecific perinephric stranding. Correlation with urinalysis recommended to exclude UTI. 3. Postsurgical changes of colectomy. No bowel obstruction. Electronically Signed   By: Anner Crete M.D.   On: 01/17/2020  17:53   DG Chest 2 View  Result Date: 01/17/2020 CLINICAL DATA:  59 year old male with sepsis. EXAM: CHEST - 2 VIEW COMPARISON:  Chest radiograph dated 12/25/2004. FINDINGS: Increased density over the posterior lower lobes, seen on the lateral view, may be artifactual and related to superimposition of the soft tissues or atelectasis. Infiltrate is less likely but not excluded. Clinical correlation is recommended. No lobar consolidation, pleural effusion, or pneumothorax. Top-normal cardiac silhouette. No acute osseous pathology. IMPRESSION: Atelectasis versus developing infiltrate involving the posterior lower lobes. Clinical correlation recommended. Electronically Signed   By: Anner Crete M.D.   On: 01/17/2020 16:05    Procedures .Critical Care Performed by: Kinnie Feil, PA-C Authorized by: Kinnie Feil, PA-C   Critical care provider statement:    Critical care time (minutes):  45   Critical care was necessary to treat or prevent imminent or life-threatening deterioration of the following conditions:  Sepsis   Critical care was time spent personally by me on the following activities:  Discussions with consultants, evaluation of patient's response to treatment, examination of patient, ordering and performing treatments and interventions, ordering and review of laboratory studies, ordering and review of radiographic studies, pulse oximetry, re-evaluation of patient's condition, obtaining history from patient or surrogate, review of old charts and development of treatment plan with patient or surrogate   I assumed direction of critical care for this patient from another provider in my specialty: no     (including critical care time)  Medications Ordered in ED Medications  vancomycin (VANCOREADY) IVPB 1750 mg/350 mL (has no administration in time range)  lactated ringers bolus 500 mL (has no administration  in time range)  lactated ringers bolus 1,000 mL (1,000 mLs Intravenous  New Bag/Given 01/17/20 1703)  ceFEPIme (MAXIPIME) 2 g in sodium chloride 0.9 % 100 mL IVPB (2 g Intravenous New Bag/Given 01/17/20 1803)  metroNIDAZOLE (FLAGYL) IVPB 500 mg (500 mg Intravenous New Bag/Given 01/17/20 1703)  acetaminophen (TYLENOL) tablet 650 mg (650 mg Oral Given 01/17/20 1703)  HYDROmorphone (DILAUDID) injection 1 mg (1 mg Intravenous Given 01/17/20 1703)  ondansetron (ZOFRAN) injection 4 mg (4 mg Intravenous Given 01/17/20 1704)    ED Course  I have reviewed the triage vital signs and the nursing notes.  Pertinent labs & imaging results that were available during my care of the patient were reviewed by me and considered in my medical decision making (see chart for details).  Clinical Course as of Jan 17 1843  Tue Jan 17, 2020  1653 Chalmers Guest): MODERATE [CG]  1653 RBC / HPF: 21-50 [CG]  1653 WBC, UA: 21-50 [CG]  1653 Bacteria, UA(!): RARE [CG]  1653 Squamous Epithelial / LPF: 0-5 [CG]  1654 Increased density over the posterior lower lobes, seen on the lateral view, may be artifactual and related to superimposition of the soft tissues or atelectasis. Infiltrate is less likely but not excluded. Clinical correlation is recommended. No lobar consolidation, pleural effusion, or pneumothorax. Top-normal cardiac silhouette. No acute osseous pathology.  DG Chest 2 View [CG]  1728 Pulse Rate(!): 133 [CG]  1728 Resp(!): 36 [CG]  1728 Temp: 100.2 F (37.9 C) [CG]  1728 WBC: 9.2 [CG]  1728 Prior to IVF   Lactic Acid, Venous(!!): 3.0 [CG]  1728 Creatinine(!): 1.58 [CG]  1728 AST(!): 77 [CG]  1728 ALT(!): 64 [CG]  1728 Total Bilirubin(!): 1.5 [CG]  1728 GFR, Est African American(!): 56 [CG]  1728 Lipase: 34 [CG]  1729 Sinus tachycardia Consider left ventricular hypertrophy Borderline T abnormalities, inferior leads SINCE LAST TRACING HEART RATE HAS INCREASED Confirmed by Malvin Johns [phone]) on 01/17/2020 5:19:09 PM  ED EKG 12-Lead [CG]  1748 BP: 129/77 [CG]  1802  IMPRESSION: 1. No acute intra-abdominopelvic pathology. 2. Mild nonspecific perinephric stranding. Correlation with urinalysis recommended to exclude UTI. 3. Postsurgical changes of colectomy. No bowel obstruction.  CT ABDOMEN PELVIS WO CONTRAST [CG]    Clinical Course User Index [CG] Arlean Hopping   MDM Rules/Calculators/A&P                          Available medical records including recent GI visit last month, nursing and triage notes reviewed to obtain more history and assist with MDM.   Patient meets SIRS criteria with tachycardia, tachypnea. Oral temp 100.2 but he has rigors and chills here.  HD stable.   Sepsis order set utilized. Source of infection unclear but likely intraabdominal given urinary symptoms. GI source also possible given history of refractory UC on chronic ciprofloxacin, changes in stool, prednisone taper, abdominal pain.   Intraabdominal antibiotics cefepime, flagyl, vancomycin ordered. C. Diff ordered.  1 L LR ordered. Appear uncomfortable but non toxic, HD stable, pending lactic acid and other labs.    Dilaudid, APAP, zofran ordered.   Continuous cardiac monitoring, pulse ox. NPO.   CTAP wo contrast ordered given extensive abdominal surgeries, tenderness, changes in BM.    1830: ER work up personally interpreted.    Lactic acidosis 3.0, normal WBC.  Slight elevation in creatinine but less than 0.5, LFT, total bilirubin.   UA with moderate leukocyties, 21-50 WBC and RBCs, rare bacteria.  CTAP shows bilateral hydronephrosis, no stones.  CXR with questionable infiltrate but CT with clear lower lobes, no respiratory symptoms.   1638: Patient re-evaluated and no clinical decline.  Actual improvement in tachcyardia, hypertension. Patient feels "better'.  No hypotension and overall clinical improvement with IVF. No signs of septic shock to warrant 30 cc/kg IVF. 500 cc LR ordered.   Will admit for severe sepsis due to UTI/pyelonephritis.  Pending  blood and urine cultures, repeat lactic.   Final Clinical Impression(s) / ED Diagnoses Final diagnoses:  SIRS (systemic inflammatory response syndrome) [facility])    Rx / DC Orders ED Discharge Orders    None         Kinnie Feil, PA-C 01/17/20 1844    Malvin Johns, MD 01/17/20 1931

## 2020-01-18 DIAGNOSIS — R7881 Bacteremia: Secondary | ICD-10-CM

## 2020-01-18 DIAGNOSIS — B9620 Unspecified Escherichia coli [E. coli] as the cause of diseases classified elsewhere: Secondary | ICD-10-CM | POA: Insufficient documentation

## 2020-01-18 LAB — C DIFFICILE QUICK SCREEN W PCR REFLEX
C Diff antigen: NEGATIVE
C Diff interpretation: NOT DETECTED
C Diff toxin: NEGATIVE

## 2020-01-18 LAB — BLOOD CULTURE ID PANEL (REFLEXED) - BCID2

## 2020-01-18 LAB — BASIC METABOLIC PANEL
Anion gap: 11 (ref 5–15)
BUN: 22 mg/dL — ABNORMAL HIGH (ref 6–20)
CO2: 19 mmol/L — ABNORMAL LOW (ref 22–32)
Calcium: 9.5 mg/dL (ref 8.9–10.3)
Chloride: 109 mmol/L (ref 98–111)
Creatinine, Ser: 1.75 mg/dL — ABNORMAL HIGH (ref 0.61–1.24)
GFR calc Af Amer: 49 mL/min — ABNORMAL LOW (ref >60)
GFR calc non Af Amer: 43 mL/min — ABNORMAL LOW (ref >60)
Glucose, Bld: 229 mg/dL — ABNORMAL HIGH (ref 70–99)
Potassium: 4.3 mmol/L (ref 3.5–5.1)
Sodium: 139 mmol/L (ref 135–145)

## 2020-01-18 LAB — LACTIC ACID, PLASMA: Lactic Acid, Venous: 1.7 mmol/L (ref 0.5–1.9)

## 2020-01-18 LAB — CBC
HCT: 37.8 % — ABNORMAL LOW (ref 39.0–52.0)
Hemoglobin: 11.9 g/dL — ABNORMAL LOW (ref 13.0–17.0)
MCH: 31.2 pg (ref 26.0–34.0)
MCHC: 31.5 g/dL (ref 30.0–36.0)
MCV: 99.2 fL (ref 80.0–100.0)
Platelets: 110 10*3/uL — ABNORMAL LOW (ref 150–400)
RBC: 3.81 MIL/uL — ABNORMAL LOW (ref 4.22–5.81)
RDW: 15.3 % (ref 11.5–15.5)
WBC: 20.8 10*3/uL — ABNORMAL HIGH (ref 4.0–10.5)
nRBC: 0 % (ref 0.0–0.2)

## 2020-01-18 LAB — HIV ANTIBODY (ROUTINE TESTING W REFLEX): HIV Screen 4th Generation wRfx: NONREACTIVE

## 2020-01-18 MED ORDER — HYDROMORPHONE HCL 1 MG/ML IJ SOLN
0.5000 mg | INTRAMUSCULAR | Status: DC | PRN
  Administered 2020-01-18 – 2020-01-19 (×10): 0.5 mg via INTRAVENOUS
  Filled 2020-01-18: qty 0.5
  Filled 2020-01-18 (×2): qty 1
  Filled 2020-01-18 (×2): qty 0.5
  Filled 2020-01-18: qty 1
  Filled 2020-01-18: qty 0.5
  Filled 2020-01-18 (×3): qty 1

## 2020-01-18 MED ORDER — SODIUM CHLORIDE 0.9 % IV BOLUS
1000.0000 mL | Freq: Once | INTRAVENOUS | Status: AC
  Administered 2020-01-18: 1000 mL via INTRAVENOUS

## 2020-01-18 MED ORDER — LABETALOL HCL 5 MG/ML IV SOLN
10.0000 mg | INTRAVENOUS | Status: DC | PRN
  Administered 2020-01-19: 10 mg via INTRAVENOUS
  Filled 2020-01-18: qty 4

## 2020-01-18 MED ORDER — SODIUM CHLORIDE 0.9 % IV SOLN: 2.0000 g | Freq: Two times a day (BID) | INTRAVENOUS | Status: DC

## 2020-01-18 MED ORDER — SODIUM CHLORIDE 0.9 % IV SOLN
1.0000 g | Freq: Three times a day (TID) | INTRAVENOUS | Status: DC
  Administered 2020-01-18 – 2020-01-24 (×19): 1 g via INTRAVENOUS
  Filled 2020-01-18 (×24): qty 1

## 2020-01-18 MED ORDER — HYDRALAZINE HCL 25 MG PO TABS: 25.0000 mg | ORAL_TABLET | ORAL | Status: DC | PRN

## 2020-01-18 NOTE — Progress Notes (Addendum)
PROGRESS NOTE    Matthew Francis   [id number]  DOB: 1960-11-17  DOA: 01/17/2020     1  PCP: Charline Bills, MD  CC: Abdominal pain, fevers, chills, back pain  Hospital Course: Matthew Francis is a 59 y.o. male with history of ulcerative colitis with total colectomy in 7564 complicated by pouchitis for which patient was recently started on prednisone by patient's gastroenterologist in [facility].  Patient is also on Cipro for anti-inflammatory properties for the same reason.   He began developing fevers, chills, low back pain, and worsening abdominal pain which prompted him to come to the ER for further evaluation. He denied any cough, shortness of breath, vomiting, nor chest pain. In the ER he was found to be mildly febrile, 100.3 with elevated lactic acid, 3. He also had leukocytosis, 20.7 and elevated creatinine, 1.58. Urinalysis was concerning for UTI and as he appeared septic, he was started on vancomycin, cefepime, Flagyl. Blood cultures were also collected. COVID-19 test was also performed and was negative. Soon after admission, blood cultures returned positive for 3/4 bottles with ESBL E. coli. His antibiotics were modified to monotherapy meropenem.   Interval History:  Seen in the ER, was admitted overnight. He was continuing to endorse ongoing abdominal pain as well as lower back pain. Overall he was feeling better than when he came in but still requiring Dilaudid for his pain control.  Old records reviewed in assessment of this patient  ROS: Constitutional: positive for chills, fevers and malaise, Respiratory: negative for cough, Cardiovascular: negative for chest pain and Gastrointestinal: positive for abdominal pain  Assessment & Plan: Severe sepsis ([facility] [place]) - met SIRS on admission with source of infection at this time felt to be urinary and has now translocated to blood (E. Coli ESBL). HR 133, RR 36, WBC 20.7, Lactic 3 initially - s/p vanc, cefepime, and Flagyl on  admission. This was changed to meropenem as blood cultures resulted with ESBL E. coli -Continue trending lactic until normalized -Continue fluids -Continue pain control -Will repeat blood cultures in a.m. and follow for clearance -Started on hydrocortisone on admission as well for severe sepsis, will continue for now and taper quickly as able  Acute lower UTI -See severe sepsis  AKI (acute kidney injury) ([place]) -Continue fluids -Repeat BMP in a.m.  Thrombocytopenia ([place]) -Considered due to sepsis -Will trend as infection is treated  Essential hypertension -Use labetalol or hydralazine as needed. 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 Patient on chronic Cipro for anti-inflammatory properties and intermittent prednisone -Continue hydrocortisone for now in setting of severe sepsis  Bacteremia due to Gram-negative bacteria -See severe sepsis -Admission cultures growing 3/4 bottles with ESBL E. coli -Repeat blood cultures on 01/19/2020 and follow for clearance   Antimicrobials: Vancomycin, cefepime, Flagyl 01/17/2020 -01/18/2020 Meropenem 01/18/2020>> present  DVT prophylaxis: Lovenox Code Status: Full Family Communication: None present Disposition Plan:  Status is: Inpatient  Remains inpatient appropriate because:Ongoing active pain requiring inpatient pain management, Unsafe d/c plan, IV treatments appropriate due to intensity of illness or inability to take PO and Inpatient level of care appropriate due to severity of illness   Dispo: The patient is from: Home              Anticipated d/c is to: Home              Anticipated d/c date is: > 3 days  Patient currently is not medically stable to d/c.       Objective: Blood pressure 131/88, pulse 82, temperature 98 F (36.7 C), resp. rate 18, height 5' 10.75" (1.797 m), weight 104.3 kg, SpO2 100 %.   Examination: General appearance: Adult man laying in bed appearing moderately uncomfortable but in no obvious distress Head: Normocephalic, without obvious abnormality, atraumatic Eyes: EOMI Lungs: clear to auscultation bilaterally Heart: Tachycardic, regular rhythm, S1-S2 present Abdomen: Vertical scar appreciated from epigastrium to umbilicus with indentation in the midline and other scattered scars. Soft, nondistended, bowel sounds present. Tenderness to palpation throughout with no rebound or guarding.  Back: Bilateral CVA tenderness Extremities: No edema Skin: mobility and turgor normal Neurologic: Grossly normal   Consultants:   None  Procedures:   None  Data Reviewed: I have personally reviewed following labs and imaging studies Results for orders placed or performed during the hospital encounter of 01/17/20 (from the past 24 hour(s))  Lactic acid, plasma     Status: Abnormal   Collection Time: 01/17/20  6:00 PM  Result Value Ref Range   Lactic Acid, Venous 2.6 (HH) 0.5 - 1.9 mmol/L  SARS Coronavirus 2 by RT PCR (hospital order, performed in [place] hospital lab) Nasopharyngeal Nasopharyngeal Swab     Status: None   Collection Time: 01/17/20  6:40 PM   Specimen: Nasopharyngeal Swab  Result Value Ref Range   SARS Coronavirus 2 NEGATIVE NEGATIVE  CBC     Status: Abnormal   Collection Time: 01/17/20 10:20 PM  Result Value Ref Range   WBC 20.7 (H) 4.0 - 10.5 K/uL   RBC 3.81 (L) 4.22 - 5.81 MIL/uL   Hemoglobin 12.1 (L) 13.0 - 17.0 g/dL   HCT 38.4 (L) 39 - 52 %   MCV 100.8 (H) 80.0 - 100.0 fL   MCH 31.8 26.0 - 34.0 pg   MCHC 31.5 30.0 - 36.0 g/dL   RDW 15.3 11.5 - 15.5 %   Platelets 129 (L) 150 - 400 K/uL   nRBC 0.0 0.0 - 0.2 %  Creatinine, serum     Status: Abnormal   Collection Time: 01/17/20 10:20 PM  Result Value Ref Range   Creatinine, Ser 2.04 (H) 0.61 - 1.24 mg/dL   GFR calc non Af Amer 35 (L) >60 mL/min   GFR calc Af Amer 41 (L) >60 mL/min   Procalcitonin     Status: None   Collection Time: 01/17/20 10:20 PM  Result Value Ref Range   Procalcitonin 24.62 ng/mL  Lactic acid, plasma     Status: Abnormal   Collection Time: 01/17/20 10:23 PM  Result Value Ref Range   Lactic Acid, Venous 2.6 (HH) 0.5 - 1.9 mmol/L  Lactic acid, plasma     Status: None   Collection Time: 01/18/20 12:23 AM  Result Value Ref Range   Lactic Acid, Venous 1.7 0.5 - 1.9 mmol/L  Basic metabolic panel     Status: Abnormal   Collection Time: 01/18/20  5:24 AM  Result Value Ref Range   Sodium 139 135 - 145 mmol/L   Potassium 4.3 3.5 - 5.1 mmol/L   Chloride 109 98 - 111 mmol/L   CO2 19 (L) 22 - 32 mmol/L   Glucose, Bld 229 (H) 70 - 99 mg/dL   BUN 22 (H) 6 - 20 mg/dL   Creatinine, Ser 1.75 (H) 0.61 - 1.24 mg/dL   Calcium 9.5 8.9 - 10.3 mg/dL   GFR calc non Af Amer 43 (L) >60 mL/min  GFR calc Af Amer 49 (L) >60 mL/min   Anion gap 11 5 - 15  CBC     Status: Abnormal   Collection Time: 01/18/20  5:24 AM  Result Value Ref Range   WBC 20.8 (H) 4.0 - 10.5 K/uL   RBC 3.81 (L) 4.22 - 5.81 MIL/uL   Hemoglobin 11.9 (L) 13.0 - 17.0 g/dL   HCT 37.8 (L) 39 - 52 %   MCV 99.2 80.0 - 100.0 fL   MCH 31.2 26.0 - 34.0 pg   MCHC 31.5 30.0 - 36.0 g/dL   RDW 15.3 11.5 - 15.5 %   Platelets 110 (L) 150 - 400 K/uL   nRBC 0.0 0.0 - 0.2 %  HIV Antibody (routine testing w rflx)     Status: None   Collection Time: 01/18/20  5:24 AM  Result Value Ref Range   HIV Screen 4th Generation wRfx Non Reactive Non Reactive    Recent Results (from the past 240 hour(s))  C Difficile Quick Screen w PCR reflex     Status: None   Collection Time: 01/17/20 12:54 PM   Specimen: STOOL  Result Value Ref Range Status   C Diff antigen NEGATIVE NEGATIVE Final   C Diff toxin NEGATIVE NEGATIVE Final   C Diff interpretation No C. difficile detected.  Final    Comment: Performed at [facility], [place] [address], [place], [place] [address]  Blood Culture (routine x 2)      Status: None (Preliminary result)   Collection Time: 01/17/20  3:48 PM   Specimen: BLOOD RIGHT HAND  Result Value Ref Range Status   Specimen Description   Final    BLOOD RIGHT HAND Performed at [facility] [address], [place], [place] [address]    Special Requests   Final    BOTTLES DRAWN AEROBIC AND ANAEROBIC Blood Culture adequate volume Performed at [facility] [address], [place], [place] [address]    Culture  Setup Time   Final    GRAM NEGATIVE RODS ANAEROBIC BOTTLE ONLY Organism ID to follow CRITICAL RESULT CALLED TO, READ BACK BY AND VERIFIED WITH: D. Wofford PharmD 9:15 01/18/20 ([email or web address]) Performed at [facility], [place] [address] [place], [place] [address]    Culture GRAM NEGATIVE RODS  Final   Report Status PENDING  Incomplete  Blood Culture (routine x 2)     Status: None (Preliminary result)   Collection Time: 01/17/20  3:48 PM   Specimen: BLOOD LEFT HAND  Result Value Ref Range Status   Specimen Description   Final    BLOOD LEFT HAND Performed at [facility] [address] [place], [place] [address]    Special Requests   Final    BOTTLES DRAWN AEROBIC AND ANAEROBIC Blood Culture adequate volume Performed at [facility] [address] [place], [place] [address]    Culture  Setup Time   Final    GRAM NEGATIVE RODS IN BOTH AEROBIC AND ANAEROBIC BOTTLES CRITICAL VALUE NOTED.  VALUE IS CONSISTENT WITH PREVIOUSLY REPORTED AND CALLED VALUE. Performed at [facility], [place] [address] [place], [place] [address]    Culture GRAM NEGATIVE RODS  Final   Report Status PENDING  Incomplete  Blood Culture ID Panel (Reflexed)     Status: Abnormal   Collection Time: 01/17/20  3:48 PM  Result Value Ref Range Status   Enterococcus faecalis NOT DETECTED NOT DETECTED Final  Enterococcus Faecium NOT DETECTED NOT DETECTED Final   Listeria monocytogenes NOT DETECTED NOT DETECTED  Final   Staphylococcus species NOT DETECTED NOT DETECTED Final   Staphylococcus aureus (BCID) NOT DETECTED NOT DETECTED Final   Staphylococcus epidermidis NOT DETECTED NOT DETECTED Final   Staphylococcus lugdunensis NOT DETECTED NOT DETECTED Final   Streptococcus species NOT DETECTED NOT DETECTED Final   Streptococcus agalactiae NOT DETECTED NOT DETECTED Final   Streptococcus pneumoniae NOT DETECTED NOT DETECTED Final   Streptococcus pyogenes NOT DETECTED NOT DETECTED Final   A.calcoaceticus-baumannii NOT DETECTED NOT DETECTED Final   Bacteroides fragilis NOT DETECTED NOT DETECTED Final   Enterobacterales DETECTED (A) NOT DETECTED Final    Comment: Enterobacterales represent a large order of gram negative bacteria, not a single organism.   Enterobacter cloacae complex NOT DETECTED NOT DETECTED Final   Escherichia coli DETECTED (A) NOT DETECTED Final    Comment: CRITICAL RESULT CALLED TO, READ BACK BY AND VERIFIED WITH: D. Wofford PharmD 9:15 01/18/20 ([email or web address])    Klebsiella aerogenes NOT DETECTED NOT DETECTED Final   Klebsiella oxytoca NOT DETECTED NOT DETECTED Final   Klebsiella pneumoniae NOT DETECTED NOT DETECTED Final   Proteus species NOT DETECTED NOT DETECTED Final   Salmonella species NOT DETECTED NOT DETECTED Final   Serratia marcescens NOT DETECTED NOT DETECTED Final   Haemophilus influenzae NOT DETECTED NOT DETECTED Final   Neisseria meningitidis NOT DETECTED NOT DETECTED Final   Pseudomonas aeruginosa NOT DETECTED NOT DETECTED Final   Stenotrophomonas maltophilia NOT DETECTED NOT DETECTED Final   Candida albicans NOT DETECTED NOT DETECTED Final   Candida auris NOT DETECTED NOT DETECTED Final   Candida glabrata NOT DETECTED NOT DETECTED Final   Candida krusei NOT DETECTED NOT DETECTED Final   Candida parapsilosis NOT DETECTED NOT DETECTED Final   Candida tropicalis NOT DETECTED NOT DETECTED Final   Cryptococcus neoformans/gattii NOT DETECTED NOT DETECTED Final   CTX-M ESBL  DETECTED (A) NOT DETECTED Final    Comment: CRITICAL RESULT CALLED TO, READ BACK BY AND VERIFIED WITH: D. Wofford PharmD 9:15 01/18/20 ([email or web address]) (NOTE) Extended spectrum beta-lactamase detected. Recommend a carbapenem as initial therapy.      Carbapenem resistance IMP NOT DETECTED NOT DETECTED Final   Carbapenem resistance KPC NOT DETECTED NOT DETECTED Final   Carbapenem resistance NDM NOT DETECTED NOT DETECTED Final   Carbapenem resist OXA 48 LIKE NOT DETECTED NOT DETECTED Final   Carbapenem resistance VIM NOT DETECTED NOT DETECTED Final    Comment: Performed at [facility], [place] [address], [place], [place] [address]  SARS Coronavirus 2 by RT PCR (hospital order, performed in [facility] hospital lab) Nasopharyngeal Nasopharyngeal Swab     Status: None   Collection Time: 01/17/20  6:40 PM   Specimen: Nasopharyngeal Swab  Result Value Ref Range Status   SARS Coronavirus 2 NEGATIVE NEGATIVE Final    Comment: (NOTE) SARS-CoV-2 target nucleic acids are NOT DETECTED.  The SARS-CoV-2 RNA is generally detectable in upper and lower respiratory specimens during the acute phase of infection. The lowest concentration of SARS-CoV-2 viral copies this assay can detect is 250 copies / mL. A negative result does not preclude SARS-CoV-2 infection and should not be used as the sole basis for treatment or other patient management decisions.  A negative result may occur with improper specimen collection / handling, submission of specimen other than nasopharyngeal swab, presence of viral mutation(s) within the areas targeted by this assay, and inadequate number of viral copies (<250 copies / mL).  A negative result must be combined with clinical observations, patient history, and epidemiological information.  Fact Sheet for Patients:   StrictlyIdeas.no  Fact Sheet for Healthcare Providers: BankingDealers.co.za  This test is not yet approved or   cleared by the [place] FDA and has been authorized for detection and/or diagnosis of SARS-CoV-2 by FDA under an Emergency Use Authorization (EUA).  This EUA will remain in effect (meaning this test can be used) for the duration of the COVID-19 declaration under Section 564(b)(1) of the Act, 21 U.S.C. section 360bbb-3(b)(1), unless the authorization is terminated or revoked sooner.  Performed at [facility], [place] [address], [place], [place] [address]      Radiology Studies: CT ABDOMEN PELVIS WO CONTRAST  Result Date: 01/17/2020 CLINICAL DATA:  59 year old male with sepsis. EXAM: CT ABDOMEN AND PELVIS WITHOUT CONTRAST TECHNIQUE: Multidetector CT imaging of the abdomen and pelvis was performed following the standard protocol without IV contrast. COMPARISON:  CT abdomen pelvis dated 01/29/2012. FINDINGS: Evaluation of this exam is limited in the absence of intravenous contrast. Lower chest: The visualized lung bases are clear. No intra-abdominal free air or free fluid. Hepatobiliary: No focal liver abnormality is seen. No gallstones, gallbladder wall thickening, or biliary dilatation. Pancreas: Unremarkable. No pancreatic ductal dilatation or surrounding inflammatory changes. Spleen: Normal in size without focal abnormality. Adrenals/Urinary Tract: The adrenal glands unremarkable. There is no hydronephrosis or nephrolithiasis on either side. Mild bilateral perinephric stranding, nonspecific. Correlation with urinalysis recommended to exclude UTI. The visualized ureters and urinary bladder appear unremarkable. Stomach/Bowel: Postsurgical changes of colectomy. There is no bowel obstruction or active inflammation. Vascular/Lymphatic: The abdominal aorta and IVC are unremarkable. No portal venous gas. There is no adenopathy. Reproductive: The prostate and seminal vesicles are grossly unremarkable. Other: Midline vertical anterior abdominal wall incisional scar. Musculoskeletal: No  acute or significant osseous findings. IMPRESSION: 1. No acute intra-abdominopelvic pathology. 2. Mild nonspecific perinephric stranding. Correlation with urinalysis recommended to exclude UTI. 3. Postsurgical changes of colectomy. No bowel obstruction. Electronically Signed   By: Anner Crete M.D.   On: 01/17/2020 17:53   DG Chest 2 View  Result Date: 01/17/2020 CLINICAL DATA:  59 year old male with sepsis. EXAM: CHEST - 2 VIEW COMPARISON:  Chest radiograph dated 12/25/2004. FINDINGS: Increased density over the posterior lower lobes, seen on the lateral view, may be artifactual and related to superimposition of the soft tissues or atelectasis. Infiltrate is less likely but not excluded. Clinical correlation is recommended. No lobar consolidation, pleural effusion, or pneumothorax. Top-normal cardiac silhouette. No acute osseous pathology. IMPRESSION: Atelectasis versus developing infiltrate involving the posterior lower lobes. Clinical correlation recommended. Electronically Signed   By: Anner Crete M.D.   On: 01/17/2020 16:05   CT ABDOMEN PELVIS WO CONTRAST  Final Result    DG Chest 2 View  Final Result       Scheduled Meds: . ascorbic acid  500 mg Oral Daily  . enoxaparin (LOVENOX) injection  40 mg Subcutaneous Q24H  . gabapentin  300 mg Oral BID  . hydrocortisone sod succinate (SOLU-CORTEF) inj  50 mg Intravenous Q8H  . loperamide  2 mg Oral BID  . multivitamin with minerals  1 tablet Oral Daily  . omega-3 acid ethyl esters  1 g Oral BID  . pantoprazole  40 mg Oral Daily  . vitamin B-12  100 mcg Oral Daily  . [START ON 01/19/2020] Vitamin D (Ergocalciferol)  50,000 Units Oral Q7 days   PRN Meds: dicyclomine, hydrALAZINE, HYDROmorphone (DILAUDID) injection,  labetalol, ondansetron **OR** ondansetron (ZOFRAN) IV, psyllium Continuous Infusions: . sodium chloride 125 mL/hr at 01/18/20 1659  . meropenem (MERREM) IV Stopped (01/18/20 1202)      LOS: 1 day  Time spent: Greater  than 50% of the 35 minute visit was spent in counseling/coordination of care for the patient as laid out in the A&P.   Dwyane Dee, MD [facility] 01/18/2020, 5:21 PM   Contact via secure chat.  To contact the attending provider between 7A-7P or the covering provider during after hours 7P-7A, please log into the web site [email or web address] and access using universal [place] password for that web site. If you do not have the password, please call the hospital operator.

## 2020-01-18 NOTE — Assessment & Plan Note (Addendum)
-  See severe sepsis -Admission cultures growing 3/4 bottles with ESBL E. coli -Repeat blood cultures on 01/19/2020 and follow for clearance -Appreciate ID consult.  7-day course of meropenem recommended.  End date 01/24/2020

## 2020-01-18 NOTE — Assessment & Plan Note (Addendum)
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 Patient on chronic Cipro for anti-inflammatory properties and intermittent prednisone - recently has been on long prednisone taper at home; on admission was on prednisone 30 mg daily (see med rec) - resume prednisone 30 mg daily on 8/9; continue taper as previously doing at discharge

## 2020-01-18 NOTE — Plan of Care (Signed)

## 2020-01-18 NOTE — Assessment & Plan Note (Addendum)
-  Renal function improved with IVF; now maintaining diet -Repeat BMP in a.m.

## 2020-01-18 NOTE — ED Notes (Signed)
Patients cousin Emilia Beck called to let the patient know that his two phones and car are at his moms house.

## 2020-01-18 NOTE — ED Notes (Signed)
Patient received lunch tray 

## 2020-01-18 NOTE — Assessment & Plan Note (Signed)
-  Use labetalol or hydralazine as needed. Hold scheduled medications in setting of severe sepsis

## 2020-01-18 NOTE — Assessment & Plan Note (Addendum)
-  Considered due to sepsis; platelets have been recovering with infection being treated

## 2020-01-18 NOTE — Assessment & Plan Note (Signed)
-  See severe sepsis

## 2020-01-18 NOTE — Hospital Course (Addendum)
Matthew Francis is a 59 y.o. male with history of ulcerative colitis with total colectomy in 6301 complicated by pouchitis for which patient was recently started on prednisone by patient's gastroenterologist in [facility].  Patient is also on Cipro for anti-inflammatory properties for the same reason.   He began developing fevers, chills, low back pain, and worsening abdominal pain which prompted him to come to the ER for further evaluation. He denied any cough, shortness of breath, vomiting, nor chest pain. In the ER he was found to be mildly febrile, 100.3 with elevated lactic acid, 3. He also had leukocytosis, 20.7 and elevated creatinine, 1.58. Urinalysis was concerning for UTI and as he appeared septic, he was started on vancomycin, cefepime, Flagyl. Blood cultures were also collected. COVID-19 test was also performed and was negative. Soon after admission, blood cultures returned positive for 3/4 bottles with ESBL E. coli. His antibiotics were modified to monotherapy meropenem. Repeat blood cultures were drawn on 01/19/2020.  Infectious disease was also consulted for further recommendations regarding length of course of antibiotics.  He was recommended to complete a 7-day course total of meropenem, for end date of 01/24/2020.  He also converted into A. fib with RVR during hospitalization.  This was considered provoked in setting of severe sepsis infection.  He had no prior history of A. fib.  He was started on a Cardizem infusion and spontaneously converted back to NSR on 01/20/2020.  Cardizem infusion was tapered after conversion and he remained in NSR and was able to be transferred out of SDU to tele.

## 2020-01-18 NOTE — ED Notes (Signed)
Patient received breakfast tray 

## 2020-01-18 NOTE — Assessment & Plan Note (Addendum)
-  met SIRS on admission with source of infection at this time felt to be urinary and has now translocated to blood (E. Coli ESBL). HR 133, RR 36, WBC 20.7, Lactic 3 initially -Hydrocortisone initially started on admission has been tapered off.  BP remained stable - s/p vanc, cefepime, [name]. This was changed to meropenem as blood cultures resulted with ESBL E. coli -Lactic normalized with trending -IVF off, he is eating well -Continue pain control.  Pain has been difficult to control with modification to regimen as needed -Cultures repeated on 01/19/2020.  Continue following. Remain NGTD -Tentative stop date for meropenem will be 01/24/2020 (7-day course) as per ID recommendation.

## 2020-01-18 NOTE — Progress Notes (Signed)
PHARMACY - PHYSICIAN COMMUNICATION CRITICAL VALUE ALERT - BLOOD CULTURE IDENTIFICATION (BCID)  Matthew Francis is an 59 y.o. male who presented to [facility] on 01/17/2020 with a chief complaint of urosepsis  Assessment:  3/4 BCx bottles with ESBL E coli (suspect urinary source)  Name of physician (or Provider) Contacted: [place]  Current antibiotics: Cefepime  Changes to prescribed antibiotics recommended: Switch cefepime to meropenem Recommendations accepted by provider  Results for orders placed or performed during the hospital encounter of 01/17/20  Blood Culture ID Panel (Reflexed) (Collected: 01/17/2020  3:48 PM)  Result Value Ref Range   Enterococcus faecalis NOT DETECTED NOT DETECTED   Enterococcus Faecium NOT DETECTED NOT DETECTED   Listeria monocytogenes NOT DETECTED NOT DETECTED   Staphylococcus species NOT DETECTED NOT DETECTED   Staphylococcus aureus (BCID) NOT DETECTED NOT DETECTED   Staphylococcus epidermidis NOT DETECTED NOT DETECTED   Staphylococcus lugdunensis NOT DETECTED NOT DETECTED   Streptococcus species NOT DETECTED NOT DETECTED   Streptococcus agalactiae NOT DETECTED NOT DETECTED   Streptococcus pneumoniae NOT DETECTED NOT DETECTED   Streptococcus pyogenes NOT DETECTED NOT DETECTED   A.calcoaceticus-baumannii NOT DETECTED NOT DETECTED   Bacteroides fragilis NOT DETECTED NOT DETECTED   Enterobacterales DETECTED (A) NOT DETECTED   Enterobacter cloacae complex NOT DETECTED NOT DETECTED   Escherichia coli DETECTED (A) NOT DETECTED   Klebsiella aerogenes NOT DETECTED NOT DETECTED   Klebsiella oxytoca NOT DETECTED NOT DETECTED   Klebsiella pneumoniae NOT DETECTED NOT DETECTED   Proteus species NOT DETECTED NOT DETECTED   Salmonella species NOT DETECTED NOT DETECTED   Serratia marcescens NOT DETECTED NOT DETECTED   Haemophilus influenzae NOT DETECTED NOT DETECTED   Neisseria meningitidis NOT DETECTED NOT DETECTED   Pseudomonas aeruginosa NOT DETECTED NOT DETECTED    Stenotrophomonas maltophilia NOT DETECTED NOT DETECTED   Candida albicans NOT DETECTED NOT DETECTED   Candida auris NOT DETECTED NOT DETECTED   Candida glabrata NOT DETECTED NOT DETECTED   Candida krusei NOT DETECTED NOT DETECTED   Candida parapsilosis NOT DETECTED NOT DETECTED   Candida tropicalis NOT DETECTED NOT DETECTED   Cryptococcus neoformans/gattii NOT DETECTED NOT DETECTED   CTX-M ESBL DETECTED (A) NOT DETECTED   Carbapenem resistance IMP NOT DETECTED NOT DETECTED   Carbapenem resistance KPC NOT DETECTED NOT DETECTED   Carbapenem resistance NDM NOT DETECTED NOT DETECTED   Carbapenem resist OXA 48 LIKE NOT DETECTED NOT DETECTED   Carbapenem resistance VIM NOT DETECTED NOT DETECTED    Cadey Bazile A 01/18/2020  9:32 AM

## 2020-01-19 DIAGNOSIS — N390 Urinary tract infection, site not specified: Secondary | ICD-10-CM

## 2020-01-19 DIAGNOSIS — R6520 Severe sepsis without septic shock: Secondary | ICD-10-CM

## 2020-01-19 DIAGNOSIS — B9629 Other Escherichia coli [E. coli] as the cause of diseases classified elsewhere: Secondary | ICD-10-CM

## 2020-01-19 DIAGNOSIS — I4891 Unspecified atrial fibrillation: Secondary | ICD-10-CM

## 2020-01-19 DIAGNOSIS — K5289 Other specified noninfective gastroenteritis and colitis: Secondary | ICD-10-CM

## 2020-01-19 DIAGNOSIS — Z1612 Extended spectrum beta lactamase (ESBL) resistance: Secondary | ICD-10-CM

## 2020-01-19 DIAGNOSIS — D696 Thrombocytopenia, unspecified: Secondary | ICD-10-CM

## 2020-01-19 DIAGNOSIS — N179 Acute kidney failure, unspecified: Secondary | ICD-10-CM

## 2020-01-19 DIAGNOSIS — I10 Essential (primary) hypertension: Secondary | ICD-10-CM

## 2020-01-19 DIAGNOSIS — A4151 Sepsis due to Escherichia coli [E. coli]: Secondary | ICD-10-CM

## 2020-01-19 HISTORY — DX: Urinary tract infection, site not specified: N39.0

## 2020-01-19 HISTORY — DX: Urinary tract infection, site not specified: B96.29

## 2020-01-19 LAB — BASIC METABOLIC PANEL
Anion gap: 6 (ref 5–15)
BUN: 18 mg/dL (ref 6–20)
CO2: 22 mmol/L (ref 22–32)
Calcium: 9.3 mg/dL (ref 8.9–10.3)
Chloride: 110 mmol/L (ref 98–111)
Creatinine, Ser: 1.51 mg/dL — ABNORMAL HIGH (ref 0.61–1.24)
GFR calc Af Amer: 59 mL/min — ABNORMAL LOW (ref >60)
GFR calc non Af Amer: 51 mL/min — ABNORMAL LOW (ref >60)
Glucose, Bld: 282 mg/dL — ABNORMAL HIGH (ref 70–99)
Potassium: 3.9 mmol/L (ref 3.5–5.1)
Sodium: 138 mmol/L (ref 135–145)

## 2020-01-19 LAB — URINE CULTURE: Culture: >=100000 — AB

## 2020-01-19 LAB — CBC WITH DIFFERENTIAL/PLATELET
Abs Immature Granulocytes: 0.23 10*3/uL — ABNORMAL HIGH (ref 0.00–0.07)
Basophils Absolute: 0 10*3/uL (ref 0.0–0.1)
Basophils Relative: 0 %
Eosinophils Absolute: 0 10*3/uL (ref 0.0–0.5)
Eosinophils Relative: 0 %
HCT: 37.4 % — ABNORMAL LOW (ref 39.0–52.0)
Hemoglobin: 11.7 g/dL — ABNORMAL LOW (ref 13.0–17.0)
Immature Granulocytes: 2 %
Lymphocytes Relative: 4 %
Lymphs Abs: 0.6 10*3/uL — ABNORMAL LOW (ref 0.7–4.0)
MCH: 31.1 pg (ref 26.0–34.0)
MCHC: 31.3 g/dL (ref 30.0–36.0)
MCV: 99.5 fL (ref 80.0–100.0)
Monocytes Absolute: 0.4 10*3/uL (ref 0.1–1.0)
Monocytes Relative: 3 %
Neutro Abs: 13.9 10*3/uL — ABNORMAL HIGH (ref 1.7–7.7)
Neutrophils Relative %: 91 %
Platelets: 119 10*3/uL — ABNORMAL LOW (ref 150–400)
RBC: 3.76 MIL/uL — ABNORMAL LOW (ref 4.22–5.81)
RDW: 15 % (ref 11.5–15.5)
WBC: 15.1 10*3/uL — ABNORMAL HIGH (ref 4.0–10.5)
nRBC: 0 % (ref 0.0–0.2)

## 2020-01-19 LAB — MAGNESIUM: Magnesium: 2.5 mg/dL — ABNORMAL HIGH (ref 1.7–2.4)

## 2020-01-19 LAB — PROCALCITONIN: Procalcitonin: 8.57 ng/mL

## 2020-01-19 LAB — PHOSPHORUS: Phosphorus: 3.2 mg/dL (ref 2.5–4.6)

## 2020-01-19 LAB — MRSA PCR SCREENING: MRSA by PCR: POSITIVE — AB

## 2020-01-19 MED ORDER — MUPIROCIN 2 % EX OINT
1.0000 "application " | TOPICAL_OINTMENT | Freq: Two times a day (BID) | CUTANEOUS | Status: AC
  Administered 2020-01-19 – 2020-01-23 (×10): 1 via NASAL
  Filled 2020-01-19 (×2): qty 22

## 2020-01-19 MED ORDER — CHLORHEXIDINE GLUCONATE CLOTH 2 % EX PADS
6.0000 | MEDICATED_PAD | Freq: Every day | CUTANEOUS | Status: AC
  Administered 2020-01-20 – 2020-01-24 (×5): 6 via TOPICAL

## 2020-01-19 MED ORDER — SODIUM CHLORIDE 0.9 % IV BOLUS
500.0000 mL | Freq: Once | INTRAVENOUS | Status: AC
  Administered 2020-01-19: 500 mL via INTRAVENOUS

## 2020-01-19 MED ORDER — METOPROLOL TARTRATE 5 MG/5ML IV SOLN
5.0000 mg | INTRAVENOUS | Status: AC | PRN
  Administered 2020-01-19 (×2): 5 mg via INTRAVENOUS

## 2020-01-19 MED ORDER — DILTIAZEM HCL-DEXTROSE 125-5 MG/125ML-% IV SOLN (PREMIX)
5.0000 mg/h | INTRAVENOUS | Status: DC
  Administered 2020-01-19: 12.5 mg/h via INTRAVENOUS
  Administered 2020-01-19: 5 mg/h via INTRAVENOUS
  Administered 2020-01-20: 12.5 mg/h via INTRAVENOUS
  Filled 2020-01-19 (×3): qty 125

## 2020-01-19 MED ORDER — ACETAMINOPHEN 500 MG PO TABS
1000.0000 mg | ORAL_TABLET | Freq: Four times a day (QID) | ORAL | Status: DC | PRN
  Administered 2020-01-20 – 2020-01-22 (×3): 1000 mg via ORAL
  Filled 2020-01-19 (×3): qty 2

## 2020-01-19 MED ORDER — METOPROLOL TARTRATE 5 MG/5ML IV SOLN
INTRAVENOUS | Status: AC
  Filled 2020-01-19: qty 5

## 2020-01-19 MED ORDER — METOPROLOL TARTRATE 5 MG/5ML IV SOLN: 5.0000 mg | INTRAVENOUS | Status: DC | PRN

## 2020-01-19 MED ORDER — FENTANYL CITRATE (PF) 100 MCG/2ML IJ SOLN
25.0000 ug | INTRAMUSCULAR | Status: DC | PRN
  Administered 2020-01-19: 25 ug via INTRAVENOUS
  Filled 2020-01-19: qty 2

## 2020-01-19 MED ORDER — TRAMADOL HCL 50 MG PO TABS
50.0000 mg | ORAL_TABLET | Freq: Four times a day (QID) | ORAL | Status: DC | PRN
  Administered 2020-01-19 – 2020-01-20 (×3): 50 mg via ORAL
  Filled 2020-01-19 (×4): qty 1

## 2020-01-19 MED ORDER — ORAL CARE MOUTH RINSE: 15.0000 mL | Freq: Two times a day (BID) | OROMUCOSAL | Status: DC

## 2020-01-19 MED ORDER — HYDROMORPHONE HCL 1 MG/ML IJ SOLN
1.0000 mg | INTRAMUSCULAR | Status: DC | PRN
  Administered 2020-01-19 – 2020-01-20 (×5): 1 mg via INTRAVENOUS
  Filled 2020-01-19 (×5): qty 1

## 2020-01-19 MED ORDER — ORAL CARE MOUTH RINSE
15.0000 mL | Freq: Two times a day (BID) | OROMUCOSAL | Status: DC
  Administered 2020-01-19 – 2020-01-24 (×10): 15 mL via OROMUCOSAL

## 2020-01-19 MED ORDER — CHLORHEXIDINE GLUCONATE CLOTH 2 % EX PADS
6.0000 | MEDICATED_PAD | Freq: Every day | CUTANEOUS | Status: DC
  Administered 2020-01-19 – 2020-01-22 (×4): 6 via TOPICAL

## 2020-01-19 NOTE — Assessment & Plan Note (Addendum)
-  Patient converted into A. fib on night of 01/18/2020.  No prior history of A. fib and he has had a cardiac work-up in the past for other reasons with no arrhythmia noted at that time -Etiology is considered in setting of severe sepsis infection with bacteremia.  No further work-up at this time unless reoccurs outside setting of severe infection.  Discussed with patient as well -Continue Cardizem and will titrate off as able; converted to NSR on 01/20/2020 - remains in NSR, occasional sinus tach not unexpected in setting of resolving infection and some deconditioning - transfer to tele

## 2020-01-19 NOTE — Consult Note (Signed)
[name] for Infectious Disease    Date of Admission:  01/17/2020    Total days of antibiotics 3        Day 2 meropenem              Reason for Consult: ESBL E. coli bacteremia complicating a UTI    Referring Provider: Dr. Dwyane Dee  Assessment: He is improving on therapy for ESBL producing E. coli bacteremia and UTI.  I recommend a total of 7 days of IV meropenem.  If he is ready for discharge before completion he could have a midline catheter placed to complete therapy at home.  He had a PICC line in the past when he had MRSA infection and managed it well.  Plan: 1. Continue meropenem for 5 more days  Principal Problem:   Severe sepsis ([place]) Active Problems:   E coli bacteremia   UTI due to extended-spectrum beta lactamase (ESBL) producing Escherichia coli   AKI (acute kidney injury) ([place])   Thrombocytopenia (HCC)   Essential hypertension   Ulcerative colitis ([place])   Atrial fibrillation with RVR (HCC)   Scheduled Meds: . ascorbic acid  500 mg Oral Daily  . Chlorhexidine Gluconate Cloth  6 each Topical Daily  . [START ON 01/20/2020] Chlorhexidine Gluconate Cloth  6 each Topical Q0600  . enoxaparin (LOVENOX) injection  40 mg Subcutaneous Q24H  . gabapentin  300 mg Oral BID  . loperamide  2 mg Oral BID  . mouth rinse  15 mL Mouth Rinse BID  . multivitamin with minerals  1 tablet Oral Daily  . mupirocin ointment  1 application Nasal BID  . omega-3 acid ethyl esters  1 g Oral BID  . pantoprazole  40 mg Oral Daily  . vitamin B-12  100 mcg Oral Daily  . Vitamin D (Ergocalciferol)  50,000 Units Oral Q7 days   Continuous Infusions: . diltiazem (CARDIZEM) infusion 12.5 mg/hr (01/19/20 1508)  . meropenem (MERREM) IV Stopped (01/19/20 1029)   PRN Meds:.dicyclomine, hydrALAZINE, HYDROmorphone (DILAUDID) injection, labetalol, metoprolol tartrate, ondansetron **OR** ondansetron (ZOFRAN) IV, psyllium  HPI: Matthew Francis is a 59 y.o. male with history of  ulcerative colitis.  He recently developed extreme fatigue, fever, chills and a warm sensation upon urination.  He had some lower back and low abdominal pain leading to admission 2 days ago.  His temperature was 100.2 degrees.  He had pyuria.  He was started on empiric antibiotic therapy.  Blood and urine cultures are growing an ESBL producing E. coli.  He was changed to meropenem yesterday.  He is now afebrile and feeling much better.  He has been on oral ciprofloxacin since 2003.  He tells me that his gastroenterologist is using it for its anti-inflammatory properties.  Not surprisingly his E. coli is resistant to quinolone antibiotics.   Review of Systems: Review of Systems  Constitutional: Negative for chills, diaphoresis and fever.  Gastrointestinal: Positive for diarrhea. Negative for abdominal pain, nausea and vomiting.  Genitourinary: Negative for dysuria, frequency and urgency.    Past Medical History:  Diagnosis Date  . Arthritis   . GERD (gastroesophageal reflux disease)   . H/O ulcerative colitis   . Hyperlipidemia   . Hypertension   . Inguinal hernia   . Ulcer    ulcerative colitis  . Ulcerative colitis   . Wears glasses     Social History   Tobacco Use  . Smoking status: Never Smoker  .  Smokeless tobacco: Never Used  Vaping Use  . Vaping Use: Never used  Substance Use Topics  . Alcohol use: Yes    Alcohol/week: 4.0 standard drinks    Types: 4 Cans of beer per week    Comment: 4 cans of beer per week  . Drug use: No    Family History  Problem Relation Age of Onset  . Cancer Sister    Allergies  Allergen Reactions  . Asacol [Mesalamine] Swelling    Throat   . Penicillins Anaphylaxis    Has patient had a PCN reaction causing immediate rash, facial/tongue/throat swelling, SOB or lightheadedness with hypotension: Yes Has patient had a PCN reaction causing severe rash involving mucus membranes or skin necrosis: No Has patient had a PCN reaction that required  hospitalization: Yes Has patient had a PCN reaction occurring within the last 10 years: No If all of the above answers are "NO", then may proceed with Cephalosporin use.  . Sulfa Antibiotics Anaphylaxis  . Bee Venom Hives and Swelling  . Iodinated Diagnostic Agents     Pt reports allergy at [place]. Rapid heartbeat and dyspnea    OBJECTIVE: Blood pressure (!) 112/91, pulse (!) 107, temperature 97.8 F (36.6 C), temperature source Oral, resp. rate 11, height 5' 10.75" (1.797 m), weight 104.3 kg, SpO2 98 %.  Physical Exam Constitutional:      Comments: He is quite jovial and very talkative.  Cardiovascular:     Rate and Rhythm: Tachycardia present. Rhythm irregular.     Heart sounds: No murmur heard.   Pulmonary:     Effort: Pulmonary effort is normal.     Breath sounds: Normal breath sounds.  Abdominal:     Palpations: Abdomen is soft.     Tenderness: There is no abdominal tenderness. There is no right CVA tenderness or left CVA tenderness.     Comments: Healed incisions.  Psychiatric:        Mood and Affect: Mood normal.     Lab Results Lab Results  Component Value Date   WBC 15.1 (H) 01/19/2020   HGB 11.7 (L) 01/19/2020   HCT 37.4 (L) 01/19/2020   MCV 99.5 01/19/2020   PLT 119 (L) 01/19/2020    Lab Results  Component Value Date   CREATININE 1.51 (H) 01/19/2020   BUN 18 01/19/2020   NA 138 01/19/2020   K 3.9 01/19/2020   CL 110 01/19/2020   CO2 22 01/19/2020    Lab Results  Component Value Date   ALT 64 (H) 01/17/2020   AST 77 (H) 01/17/2020   ALKPHOS 83 01/17/2020   BILITOT 1.5 (H) 01/17/2020     Microbiology: Recent Results (from the past 240 hour(s))  C Difficile Quick Screen w PCR reflex     Status: None   Collection Time: 01/17/20 12:54 PM   Specimen: STOOL  Result Value Ref Range Status   C Diff antigen NEGATIVE NEGATIVE Final   C Diff toxin NEGATIVE NEGATIVE Final   C Diff interpretation No C. difficile detected.  Final    Comment:  Performed at [facility], [place] [address] [place], [place] [address]  Blood Culture (routine x 2)     Status: None (Preliminary result)   Collection Time: 01/17/20  3:48 PM   Specimen: BLOOD RIGHT HAND  Result Value Ref Range Status   Specimen Description   Final    BLOOD RIGHT HAND Performed at [facility] [address] [place], [place] [address]  Special Requests   Final    BOTTLES DRAWN AEROBIC AND ANAEROBIC Blood Culture adequate volume Performed at [facility] [address], [place], [place] [address]    Culture  Setup Time   Final    GRAM NEGATIVE RODS ANAEROBIC BOTTLE ONLY Organism ID to follow CRITICAL RESULT CALLED TO, READ BACK BY AND VERIFIED WITH: D. Wofford PharmD 9:15 01/18/20 ([email or web address]) Performed at [facility], [place] [address] [place], [place] [address]    Culture GRAM NEGATIVE RODS  Final   Report Status PENDING  Incomplete  Blood Culture (routine x 2)     Status: Abnormal (Preliminary result)   Collection Time: 01/17/20  3:48 PM   Specimen: BLOOD LEFT HAND  Result Value Ref Range Status   Specimen Description   Final    BLOOD LEFT HAND Performed at [facility] [address] [place], [place] [address]    Special Requests   Final    BOTTLES DRAWN AEROBIC AND ANAEROBIC Blood Culture adequate volume Performed at [facility] [address], [place], [place] [address]    Culture  Setup Time   Final    GRAM NEGATIVE RODS IN BOTH AEROBIC AND ANAEROBIC BOTTLES CRITICAL VALUE NOTED.  VALUE IS CONSISTENT WITH PREVIOUSLY REPORTED AND CALLED VALUE. Performed at [facility], [place] [address] [place], [place] [address]    Culture ESCHERICHIA COLI (A)  Final   Report Status PENDING  Incomplete  Urine culture     Status: Abnormal   Collection Time: 01/17/20  3:48 PM   Specimen: In/Out Cath Urine  Result Value Ref Range Status   Specimen Description    Final    IN/OUT CATH URINE Performed at [facility] [address], [place], [place] [address]    Special Requests   Final    NONE Performed at [facility], [place] [address] [place],  [address]    Culture (A)  Final    >=100,000 COLONIES/mL ESCHERICHIA COLI Confirmed Extended Spectrum Beta-Lactamase Producer (ESBL).  In bloodstream infections from ESBL organisms, carbapenems are preferred over piperacillin/tazobactam. They are shown to have a lower risk of mortality.    Report Status 01/19/2020 FINAL  Final   Organism ID, Bacteria ESCHERICHIA COLI (A)  Final      Susceptibility   Escherichia coli - MIC*    AMPICILLIN >=32 RESISTANT Resistant     CEFAZOLIN >=64 RESISTANT Resistant     CEFTRIAXONE >=64 RESISTANT Resistant     CIPROFLOXACIN >=4 RESISTANT Resistant     GENTAMICIN <=1 SENSITIVE Sensitive     IMIPENEM <=0.25 SENSITIVE Sensitive     NITROFURANTOIN 32 SENSITIVE Sensitive     TRIMETH/SULFA >=320 RESISTANT Resistant     AMPICILLIN/SULBACTAM >=32 RESISTANT Resistant     PIP/TAZO 8 SENSITIVE Sensitive     * >=100,000 COLONIES/mL ESCHERICHIA COLI  Blood Culture ID Panel (Reflexed)     Status: Abnormal   Collection Time: 01/17/20  3:48 PM  Result Value Ref Range Status   Enterococcus faecalis NOT DETECTED NOT DETECTED Final   Enterococcus Faecium NOT DETECTED NOT DETECTED Final   Listeria monocytogenes NOT DETECTED NOT DETECTED Final   Staphylococcus species NOT DETECTED NOT DETECTED Final   Staphylococcus aureus (BCID) NOT DETECTED NOT DETECTED Final   Staphylococcus epidermidis NOT DETECTED NOT DETECTED Final   Staphylococcus lugdunensis NOT DETECTED NOT DETECTED Final   Streptococcus species NOT DETECTED NOT DETECTED Final   Streptococcus agalactiae  NOT DETECTED NOT DETECTED Final   Streptococcus pneumoniae NOT DETECTED NOT DETECTED Final   Streptococcus pyogenes NOT DETECTED NOT DETECTED Final   A.calcoaceticus-baumannii  NOT DETECTED NOT DETECTED Final   Bacteroides fragilis NOT DETECTED NOT DETECTED Final   Enterobacterales DETECTED (A) NOT DETECTED Final    Comment: Enterobacterales represent a large order of gram negative bacteria, not a single organism.   Enterobacter cloacae complex NOT DETECTED NOT DETECTED Final   Escherichia coli DETECTED (A) NOT DETECTED Final    Comment: CRITICAL RESULT CALLED TO, READ BACK BY AND VERIFIED WITH: D. Wofford PharmD 9:15 01/18/20 ([email or web address])    Klebsiella aerogenes NOT DETECTED NOT DETECTED Final   Klebsiella oxytoca NOT DETECTED NOT DETECTED Final   Klebsiella pneumoniae NOT DETECTED NOT DETECTED Final   Proteus species NOT DETECTED NOT DETECTED Final   Salmonella species NOT DETECTED NOT DETECTED Final   Serratia marcescens NOT DETECTED NOT DETECTED Final   Haemophilus influenzae NOT DETECTED NOT DETECTED Final   Neisseria meningitidis NOT DETECTED NOT DETECTED Final   Pseudomonas aeruginosa NOT DETECTED NOT DETECTED Final   Stenotrophomonas maltophilia NOT DETECTED NOT DETECTED Final   Candida albicans NOT DETECTED NOT DETECTED Final   Candida auris NOT DETECTED NOT DETECTED Final   Candida glabrata NOT DETECTED NOT DETECTED Final   Candida krusei NOT DETECTED NOT DETECTED Final   Candida parapsilosis NOT DETECTED NOT DETECTED Final   Candida tropicalis NOT DETECTED NOT DETECTED Final   Cryptococcus neoformans/gattii NOT DETECTED NOT DETECTED Final   CTX-M ESBL DETECTED (A) NOT DETECTED Final    Comment: CRITICAL RESULT CALLED TO, READ BACK BY AND VERIFIED WITH: D. Wofford PharmD 9:15 01/18/20 ([email or web address]) (NOTE) Extended spectrum beta-lactamase detected. Recommend a carbapenem as initial therapy.      Carbapenem resistance IMP NOT DETECTED NOT DETECTED Final   Carbapenem resistance KPC NOT DETECTED NOT DETECTED Final   Carbapenem resistance NDM NOT DETECTED NOT DETECTED Final   Carbapenem resist OXA 48 LIKE NOT DETECTED NOT DETECTED Final   Carbapenem  resistance VIM NOT DETECTED NOT DETECTED Final    Comment: Performed at [facility], [place] [address] [place], [place] [address]  SARS Coronavirus 2 by RT PCR (hospital order, performed in [facility] hospital lab) Nasopharyngeal Nasopharyngeal Swab     Status: None   Collection Time: 01/17/20  6:40 PM   Specimen: Nasopharyngeal Swab  Result Value Ref Range Status   SARS Coronavirus 2 NEGATIVE NEGATIVE Final    Comment: (NOTE) SARS-CoV-2 target nucleic acids are NOT DETECTED.  The SARS-CoV-2 RNA is generally detectable in upper and lower respiratory specimens during the acute phase of infection. The lowest concentration of SARS-CoV-2 viral copies this assay can detect is 250 copies / mL. A negative result does not preclude SARS-CoV-2 infection and should not be used as the sole basis for treatment or other patient management decisions.  A negative result may occur with improper specimen collection / handling, submission of specimen other than nasopharyngeal swab, presence of viral mutation(s) within the areas targeted by this assay, and inadequate number of viral copies (<250 copies / mL). A negative result must be combined with clinical observations, patient history, and epidemiological information.  Fact Sheet for Patients:   StrictlyIdeas.no  Fact Sheet for Healthcare Providers: BankingDealers.co.za  This test is not yet approved or  cleared by the [place] FDA and has been authorized for detection and/or diagnosis of SARS-CoV-2 by FDA under an Emergency Use Authorization (EUA).  This EUA will  remain in effect (meaning this test can be used) for the duration of the COVID-19 declaration under Section 564(b)(1) of the Act, 21 U.S.C. section 360bbb-3(b)(1), unless the authorization is terminated or revoked sooner.  Performed at [facility], [place] [address] [place], [place] [address]   MRSA PCR  Screening     Status: Abnormal   Collection Time: 01/19/20  7:46 AM   Specimen: Nasal Mucosa; Nasopharyngeal  Result Value Ref Range Status   MRSA by PCR POSITIVE (A) NEGATIVE Final    Comment:        The GeneXpert MRSA Assay (FDA approved for NASAL specimens only), is one component of a comprehensive MRSA colonization surveillance program. It is not intended to diagnose MRSA infection nor to guide or monitor treatment for MRSA infections. RESULT CALLED TO, READ BACK BY AND VERIFIED WITH: H.SEGERS AT 1341 ON 01/19/20 BY N.THOMPSON Performed at [facility] [place], [place] [address], [place], [place] [address]     Michel Bickers, [facility] [phone] pager   [phone] cell 01/19/2020, 3:16 PM

## 2020-01-19 NOTE — Progress Notes (Signed)
VAST consulted to obtain 2nd IV access. Pt has had 5 Piv's in less than 3 days. If he continues to need multiple points of access, he should be considered for PICC or CL placement. Pt stated he previously had a PICC in his right arm for 8 months.

## 2020-01-19 NOTE — Progress Notes (Signed)
At around 2340 pt up to bathroom independently. Call from tele that HR 180's and in vtach. RN to patients room. Pt awake alert on toilet. Stated he felt his heart beating fast. Patient assisted back to bed lead placement checked. Pt was SOB, place don 4L o2. Pt remained with elevated heart rate after being placed into bed, monitor checked and on o2. On monitor patient appear to be in Afib. RRT was made aware and came and assessed patient. During RRT provider made aware EKG, 573m NS bolus, PRN labetolol given and 522mlopressor x2 given. Pt did not respond to treatment and was transferred to ICU room 1229 at around 01Kenwood EstatesReport given to DaAndee PolesRN.

## 2020-01-19 NOTE — Significant Event (Addendum)
Rapid Response Event Note  Reason for Call : Primary nurse called regarding patient in Afib, and 5 beats of V tach.    Initial Focused Assessment:  Upon arrival to room nurse reported that patient just ambulated to bathroom, he states  that his heart feels  like it was going to jump out of his chest.  Denies chest pain, increased shortness of breath at this time, primary nurse placed patient on 2 liters [place], due to being slightly short of breath with episode  He states again that I can feel my heart beating really fast, his heart rate  147 Afib per monitor and EKG      Interventions:  EKG : R 138 Afib RVR  Paged Provider Labetalol 10 mg   500 mL Bolus NS  Lopressor 5 mg x 2 Transfer to ICU for       Plan of Care:  Transfer to ICU for Cardizem Gtt and closer monitoring.    MD Notified:  M. Sharlet Salina NP  Call Time:  0005  Arrival Time: 0100 in ICU at transfer  End Time: [place] Keagon Glascoe MSN, RN-BC  [place]

## 2020-01-19 NOTE — TOC Initial Note (Signed)
Transition of Care [facility]) - Initial/Assessment Note    Patient Details  Name: Matthew Francis MRN: [id number] Date of Birth: 1960-07-31  [facility] CM/SW Contact:    Leeroy Cha, RN Phone Number: 01/19/2020, 7:56 AM  Clinical Narrative:                 Pt admitted with severe sepsis, wbc 15.1,from home has pcp, plan is to return to home. Iv abx, iv solu cortef. o2 at 2l/min via . Following for progression and tov needs. Expected Discharge Plan: Home/Self Care Barriers to Discharge: Continued Medical Work up   Patient Goals and CMS Choice Patient states their goals for this hospitalization and ongoing recovery are:: to go home and feel normal CMS Medicare.gov Compare Post Acute Care list provided to:: Patient    Expected Discharge Plan and Services Expected Discharge Plan: Home/Self Care   Discharge Planning Services: CM Consult   Living arrangements for the past 2 months: Single Family Home                                      Prior Living Arrangements/Services Living arrangements for the past 2 months: Single Family Home Lives with:: Self Patient language and need for interpreter reviewed:: Yes Do you feel safe going back to the place where you live?: Yes      Need for Family Participation in Patient Care: Yes (Comment) Care giver support system in place?: Yes (comment)   Criminal Activity/Legal Involvement Pertinent to Current Situation/Hospitalization: No - Comment as needed  Activities of Daily Living Home Assistive Devices/Equipment: Eyeglasses ADL Screening (condition at time of admission) Patient's cognitive ability adequate to safely complete daily activities?: Yes Is the patient deaf or have difficulty hearing?: No Does the patient have difficulty seeing, even when wearing glasses/contacts?: No Does the patient have difficulty concentrating, remembering, or making decisions?: No Patient able to express need for assistance with  ADLs?: Yes Does the patient have difficulty dressing or bathing?: No Independently performs ADLs?: Yes (appropriate for developmental age) Does the patient have difficulty walking or climbing stairs?: No Weakness of Legs: Both Weakness of Arms/Hands: Both  Permission Sought/Granted                  Emotional Assessment Appearance:: Appears stated age Attitude/Demeanor/Rapport: Engaged Affect (typically observed): Calm Orientation: : Oriented to Self, Oriented to Place, Oriented to  Time, Oriented to Situation Alcohol / Substance Use: Not Applicable Psych Involvement: No (comment)  Admission diagnosis:  SIRS (systemic inflammatory response syndrome) ([place]) [R65.10] Sepsis ([place]) [A41.9] Patient Active Problem List   Diagnosis Date Noted  . Bacteremia due to Gram-negative bacteria 01/18/2020  . Severe sepsis ([place]) 01/17/2020  . Acute lower UTI 01/17/2020  . AKI (acute kidney injury) ([place]) 01/17/2020  . Thrombocytopenia ([place]) 01/17/2020  . Essential hypertension 01/17/2020  . Ulcerative colitis ([place]) 01/17/2020  . 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 02/09/2012   PCP:  Charline Bills, MD Pharmacy:   [facility] [place], [place] [address] Phone: [phone] Fax: [phone]     Social Determinants of Health (SDOH) Interventions    Readmission Risk Interventions No flowsheet data found.

## 2020-01-19 NOTE — Progress Notes (Signed)
PROGRESS NOTE    GRAVES NIPP   [id number]  DOB: 07-30-1960  DOA: 01/17/2020     2  PCP: Charline Bills, MD  CC: Abdominal pain, fevers, chills, back pain  Hospital Course: Matthew Francis is a 59 y.o. male with history of ulcerative colitis with total colectomy in 5498 complicated by pouchitis for which patient was recently started on prednisone by patient's gastroenterologist in [facility].  Patient is also on Cipro for anti-inflammatory properties for the same reason.   He began developing fevers, chills, low back pain, and worsening abdominal pain which prompted him to come to the ER for further evaluation. He denied any cough, shortness of breath, vomiting, nor chest pain. In the ER he was found to be mildly febrile, 100.3 with elevated lactic acid, 3. He also had leukocytosis, 20.7 and elevated creatinine, 1.58. Urinalysis was concerning for UTI and as he appeared septic, he was started on vancomycin, cefepime, Flagyl. Blood cultures were also collected. COVID-19 test was also performed and was negative. Soon after admission, blood cultures returned positive for 3/4 bottles with ESBL E. coli. His antibiotics were modified to monotherapy meropenem. Repeat blood cultures were drawn on 01/19/2020.  Infectious disease was also consulted for further recommendations regarding length of course of antibiotics.   Interval History:  Developed A. fib overnight with RVR.  No prior history of such.  Discussed with him that this is likely due to his underlying infection and is provoked A. fib.  He was started on a Cardizem infusion which we will taper off as able.  He denies any chest pain but states he did feel his heart beating fast overnight when he was RVR.  He also did have a small bowel movement overnight and continues to have flatus. He does endorse that his abdominal pain is better than yesterday as well as back pain.  Old records reviewed in assessment of this  patient  ROS: Constitutional: positive for chills, fevers and malaise, Respiratory: negative for cough, Cardiovascular: negative for chest pain and Gastrointestinal: positive for abdominal pain  Assessment & Plan: Severe sepsis ([place]) - met SIRS on admission with source of infection at this time felt to be urinary and has now translocated to blood (E. Coli ESBL). HR 133, RR 36, WBC 20.7, Lactic 3 initially - s/p vanc, cefepime, [name]. This was changed to meropenem as blood cultures resulted with ESBL E. coli -Continue trending lactic until normalized -Continue fluids -Continue pain control -Will repeat blood cultures in a.m. and follow for clearance; cultures repeated 8/5 -Started on hydrocortisone on admission as well for severe sepsis, will continue for now and taper quickly as able  Acute lower UTI -See severe sepsis  AKI (acute kidney injury) ([place]) -Continue fluids -Repeat BMP in a.m.  Thrombocytopenia ([place]) -Considered due to sepsis -Will trend as infection is treated  Essential hypertension -Use labetalol or hydralazine as needed. 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 Patient on chronic Cipro for anti-inflammatory properties and intermittent prednisone -Continue hydrocortisone for now in setting of severe sepsis  Bacteremia due to Gram-negative bacteria -See severe sepsis -Admission cultures growing 3/4 bottles with ESBL E. coli -Repeat blood cultures on 01/19/2020 and follow for clearance - ID consult for rec's regarding length of course of abx in setting of patient on chronic cipro at home (see UC)  Atrial fibrillation with RVR ([place]) -Patient converted into A. fib  on night of 01/18/2020.  No prior history of A. fib and he has had a cardiac work-up in the past for other reasons with no arrhythmia noted at that time -Etiology is considered in setting of  severe sepsis infection with bacteremia.  No further work-up at this time unless reoccurs outside setting of severe infection.  Discussed with patient as well -Continue Cardizem and will titrate off as able   Antimicrobials: Vancomycin, cefepime, Flagyl 01/17/2020 -01/18/2020 Meropenem 01/18/2020>> present  DVT prophylaxis: Lovenox Code Status: Full Family Communication: None present Disposition Plan:  Status is: Inpatient  Remains inpatient appropriate because:Ongoing active pain requiring inpatient pain management, Unsafe d/c plan, IV treatments appropriate due to intensity of illness or inability to take PO and Inpatient level of care appropriate due to severity of illness   Dispo: The patient is from: Home              Anticipated d/c is to: Home              Anticipated d/c date is: > 3 days              Patient currently is not medically stable to d/c.   Objective: Blood pressure 131/87, pulse 99, temperature 97.8 F (36.6 C), temperature source Oral, resp. rate 20, height 5' 10.75" (1.797 m), weight 104.3 kg, SpO2 97 %.  Examination: General appearance: Adult man resting in bed in no distress and appears much more comfortable than yesterday. Head: Normocephalic, without obvious abnormality, atraumatic Eyes: EOMI Lungs: clear to auscultation bilaterally Heart: irregularly irregular rhythm and S1, S2 normal Abdomen: Vertical scar appreciated from epigastrium to umbilicus with indentation in the midline and other scattered scars. Soft, nondistended, bowel sounds present. Tenderness to palpation throughout which is much improved with no rebound or guarding.  Back: Bilateral CVA tenderness now minimal compared to previous exam Extremities: No edema Skin: mobility and turgor normal Neurologic: Grossly normal   Consultants:   ID  Procedures:   None  Data Reviewed: I have personally reviewed following labs and imaging studies Results for orders placed or performed during the  hospital encounter of 01/17/20 (from the past 24 hour(s))  Basic metabolic panel     Status: Abnormal   Collection Time: 01/19/20  2:23 AM  Result Value Ref Range   Sodium 138 135 - 145 mmol/L   Potassium 3.9 3.5 - 5.1 mmol/L   Chloride 110 98 - 111 mmol/L   CO2 22 22 - 32 mmol/L   Glucose, Bld 282 (H) 70 - 99 mg/dL   BUN 18 6 - 20 mg/dL   Creatinine, Ser 1.51 (H) 0.61 - 1.24 mg/dL   Calcium 9.3 8.9 - 10.3 mg/dL   GFR calc non Af Amer 51 (L) >60 mL/min   GFR calc Af Amer 59 (L) >60 mL/min   Anion gap 6 5 - 15  CBC with Differential/Platelet     Status: Abnormal   Collection Time: 01/19/20  2:23 AM  Result Value Ref Range   WBC 15.1 (H) 4.0 - 10.5 K/uL   RBC 3.76 (L) 4.22 - 5.81 MIL/uL   Hemoglobin 11.7 (L) 13.0 - 17.0 g/dL   HCT 37.4 (L) 39 - 52 %   MCV 99.5 80.0 - 100.0 fL   MCH 31.1 26.0 - 34.0 pg   MCHC 31.3 30.0 - 36.0 g/dL   RDW 15.0 11.5 - 15.5 %   Platelets 119 (L) 150 - 400 K/uL   nRBC 0.0 0.0 - 0.2 %  Neutrophils Relative % 91 %   Neutro Abs 13.9 (H) 1.7 - 7.7 K/uL   Lymphocytes Relative 4 %   Lymphs Abs 0.6 (L) 0.7 - 4.0 K/uL   Monocytes Relative 3 %   Monocytes Absolute 0.4 0 - 1 K/uL   Eosinophils Relative 0 %   Eosinophils Absolute 0.0 0 - 0 K/uL   Basophils Relative 0 %   Basophils Absolute 0.0 0 - 0 K/uL   Immature Granulocytes 2 %   Abs Immature Granulocytes 0.23 (H) 0.00 - 0.07 K/uL  Magnesium     Status: Abnormal   Collection Time: 01/19/20  2:23 AM  Result Value Ref Range   Magnesium 2.5 (H) 1.7 - 2.4 mg/dL  Phosphorus     Status: None   Collection Time: 01/19/20  2:23 AM  Result Value Ref Range   Phosphorus 3.2 2.5 - 4.6 mg/dL    Recent Results (from the past 240 hour(s))  C Difficile Quick Screen w PCR reflex     Status: None   Collection Time: 01/17/20 12:54 PM   Specimen: STOOL  Result Value Ref Range Status   C Diff antigen NEGATIVE NEGATIVE Final   C Diff toxin NEGATIVE NEGATIVE Final   C Diff interpretation No C. difficile detected.   Final    Comment: Performed at [facility], [place] [address], [place], [place] [address]  Blood Culture (routine x 2)     Status: None (Preliminary result)   Collection Time: 01/17/20  3:48 PM   Specimen: BLOOD RIGHT HAND  Result Value Ref Range Status   Specimen Description   Final    BLOOD RIGHT HAND Performed at [place] [address], [place], [place] [address]    Special Requests   Final    BOTTLES DRAWN AEROBIC AND ANAEROBIC Blood Culture adequate volume Performed at [place] [address] [place], [place] [address]    Culture  Setup Time   Final    GRAM NEGATIVE RODS ANAEROBIC BOTTLE ONLY Organism ID to follow CRITICAL RESULT CALLED TO, READ BACK BY AND VERIFIED WITH: D. Wofford PharmD 9:15 01/18/20 ([email or web address]) Performed at [facility], [place] [address], [place], [place] [address]    Culture GRAM NEGATIVE RODS  Final   Report Status PENDING  Incomplete  Blood Culture (routine x 2)     Status: Abnormal (Preliminary result)   Collection Time: 01/17/20  3:48 PM   Specimen: BLOOD LEFT HAND  Result Value Ref Range Status   Specimen Description   Final    BLOOD LEFT HAND Performed at [place] [address], [place], [place] [address]    Special Requests   Final    BOTTLES DRAWN AEROBIC AND ANAEROBIC Blood Culture adequate volume Performed at [place] [address], [place], [place] [address]    Culture  Setup Time   Final    GRAM NEGATIVE RODS IN BOTH AEROBIC AND ANAEROBIC BOTTLES CRITICAL VALUE NOTED.  VALUE IS CONSISTENT WITH PREVIOUSLY REPORTED AND CALLED VALUE. Performed at [facility], [place] [address] [place], [place] [address]    Culture ESCHERICHIA COLI (A)  Final   Report Status PENDING  Incomplete  Urine culture     Status: Abnormal   Collection Time: 01/17/20  3:48 PM   Specimen: In/Out Cath Urine  Result Value Ref Range Status    Specimen Description   Final    IN/OUT CATH URINE Performed  at [facility], [place] [address] [place], [place] [address]    Special Requests   Final    NONE Performed at [facility], [place] [address] [place], [place] [address]    Culture (A)  Final    >=100,000 COLONIES/mL ESCHERICHIA COLI Confirmed Extended Spectrum Beta-Lactamase Producer (ESBL).  In bloodstream infections from ESBL organisms, carbapenems are preferred over piperacillin/tazobactam. They are shown to have a lower risk of mortality.    Report Status 01/19/2020 FINAL  Final   Organism ID, Bacteria ESCHERICHIA COLI (A)  Final      Susceptibility   Escherichia coli - MIC*    AMPICILLIN >=32 RESISTANT Resistant     CEFAZOLIN >=64 RESISTANT Resistant     CEFTRIAXONE >=64 RESISTANT Resistant     CIPROFLOXACIN >=4 RESISTANT Resistant     GENTAMICIN <=1 SENSITIVE Sensitive     IMIPENEM <=0.25 SENSITIVE Sensitive     NITROFURANTOIN 32 SENSITIVE Sensitive     TRIMETH/SULFA >=320 RESISTANT Resistant     AMPICILLIN/SULBACTAM >=32 RESISTANT Resistant     PIP/TAZO 8 SENSITIVE Sensitive     * >=100,000 COLONIES/mL ESCHERICHIA COLI  Blood Culture ID Panel (Reflexed)     Status: Abnormal   Collection Time: 01/17/20  3:48 PM  Result Value Ref Range Status   Enterococcus faecalis NOT DETECTED NOT DETECTED Final   Enterococcus Faecium NOT DETECTED NOT DETECTED Final   Listeria monocytogenes NOT DETECTED NOT DETECTED Final   Staphylococcus species NOT DETECTED NOT DETECTED Final   Staphylococcus aureus (BCID) NOT DETECTED NOT DETECTED Final   Staphylococcus epidermidis NOT DETECTED NOT DETECTED Final   Staphylococcus lugdunensis NOT DETECTED NOT DETECTED Final   Streptococcus species NOT DETECTED NOT DETECTED Final   Streptococcus agalactiae NOT DETECTED NOT DETECTED Final   Streptococcus pneumoniae NOT DETECTED NOT DETECTED Final   Streptococcus pyogenes NOT DETECTED NOT DETECTED Final    A.calcoaceticus-baumannii NOT DETECTED NOT DETECTED Final   Bacteroides fragilis NOT DETECTED NOT DETECTED Final   Enterobacterales DETECTED (A) NOT DETECTED Final    Comment: Enterobacterales represent a large order of gram negative bacteria, not a single organism.   Enterobacter cloacae complex NOT DETECTED NOT DETECTED Final   Escherichia coli DETECTED (A) NOT DETECTED Final    Comment: CRITICAL RESULT CALLED TO, READ BACK BY AND VERIFIED WITH: D. Wofford PharmD 9:15 01/18/20 ([email or web address])    Klebsiella aerogenes NOT DETECTED NOT DETECTED Final   Klebsiella oxytoca NOT DETECTED NOT DETECTED Final   Klebsiella pneumoniae NOT DETECTED NOT DETECTED Final   Proteus species NOT DETECTED NOT DETECTED Final   Salmonella species NOT DETECTED NOT DETECTED Final   Serratia marcescens NOT DETECTED NOT DETECTED Final   Haemophilus influenzae NOT DETECTED NOT DETECTED Final   Neisseria meningitidis NOT DETECTED NOT DETECTED Final   Pseudomonas aeruginosa NOT DETECTED NOT DETECTED Final   Stenotrophomonas maltophilia NOT DETECTED NOT DETECTED Final   Candida albicans NOT DETECTED NOT DETECTED Final   Candida auris NOT DETECTED NOT DETECTED Final   Candida glabrata NOT DETECTED NOT DETECTED Final   Candida krusei NOT DETECTED NOT DETECTED Final   Candida parapsilosis NOT DETECTED NOT DETECTED Final   Candida tropicalis NOT DETECTED NOT DETECTED Final   Cryptococcus neoformans/gattii NOT DETECTED NOT DETECTED Final   CTX-M ESBL DETECTED (A) NOT DETECTED Final    Comment: CRITICAL RESULT CALLED TO, READ BACK BY AND VERIFIED WITH: D. Wofford PharmD 9:15 01/18/20 ([email or web address]) (NOTE) Extended spectrum beta-lactamase detected. Recommend a carbapenem as initial therapy.  Carbapenem resistance IMP NOT DETECTED NOT DETECTED Final   Carbapenem resistance KPC NOT DETECTED NOT DETECTED Final   Carbapenem resistance NDM NOT DETECTED NOT DETECTED Final   Carbapenem resist OXA 48 LIKE NOT DETECTED NOT DETECTED  Final   Carbapenem resistance VIM NOT DETECTED NOT DETECTED Final    Comment: Performed at [facility], [place] [address] [place], [place] [address]  SARS Coronavirus 2 by RT PCR (hospital order, performed in [facility] hospital lab) Nasopharyngeal Nasopharyngeal Swab     Status: None   Collection Time: 01/17/20  6:40 PM   Specimen: Nasopharyngeal Swab  Result Value Ref Range Status   SARS Coronavirus 2 NEGATIVE NEGATIVE Final    Comment: (NOTE) SARS-CoV-2 target nucleic acids are NOT DETECTED.  The SARS-CoV-2 RNA is generally detectable in upper and lower respiratory specimens during the acute phase of infection. The lowest concentration of SARS-CoV-2 viral copies this assay can detect is 250 copies / mL. A negative result does not preclude SARS-CoV-2 infection and should not be used as the sole basis for treatment or other patient management decisions.  A negative result may occur with improper specimen collection / handling, submission of specimen other than nasopharyngeal swab, presence of viral mutation(s) within the areas targeted by this assay, and inadequate number of viral copies (<250 copies / mL). A negative result must be combined with clinical observations, patient history, and epidemiological information.  Fact Sheet for Patients:   StrictlyIdeas.no  Fact Sheet for Healthcare Providers: BankingDealers.co.za  This test is not yet approved or  cleared by the [place] FDA and has been authorized for detection and/or diagnosis of SARS-CoV-2 by FDA under an Emergency Use Authorization (EUA).  This EUA will remain in effect (meaning this test can be used) for the duration of the COVID-19 declaration under Section 564(b)(1) of the Act, 21 U.S.C. section 360bbb-3(b)(1), unless the authorization is terminated or revoked sooner.  Performed at [facility], [place] [address], [place], [place] [address]       Radiology Studies: CT ABDOMEN PELVIS WO CONTRAST  Result Date: 01/17/2020 CLINICAL DATA:  59 year old male with sepsis. EXAM: CT ABDOMEN AND PELVIS WITHOUT CONTRAST TECHNIQUE: Multidetector CT imaging of the abdomen and pelvis was performed following the standard protocol without IV contrast. COMPARISON:  CT abdomen pelvis dated 01/29/2012. FINDINGS: Evaluation of this exam is limited in the absence of intravenous contrast. Lower chest: The visualized lung bases are clear. No intra-abdominal free air or free fluid. Hepatobiliary: No focal liver abnormality is seen. No gallstones, gallbladder wall thickening, or biliary dilatation. Pancreas: Unremarkable. No pancreatic ductal dilatation or surrounding inflammatory changes. Spleen: Normal in size without focal abnormality. Adrenals/Urinary Tract: The adrenal glands unremarkable. There is no hydronephrosis or nephrolithiasis on either side. Mild bilateral perinephric stranding, nonspecific. Correlation with urinalysis recommended to exclude UTI. The visualized ureters and urinary bladder appear unremarkable. Stomach/Bowel: Postsurgical changes of colectomy. There is no bowel obstruction or active inflammation. Vascular/Lymphatic: The abdominal aorta and IVC are unremarkable. No portal venous gas. There is no adenopathy. Reproductive: The prostate and seminal vesicles are grossly unremarkable. Other: Midline vertical anterior abdominal wall incisional scar. Musculoskeletal: No acute or significant osseous findings. IMPRESSION: 1. No acute intra-abdominopelvic pathology. 2. Mild nonspecific perinephric stranding. Correlation with urinalysis recommended to exclude UTI. 3. Postsurgical changes of colectomy. No bowel obstruction. Electronically Signed   By: Anner Crete M.D.   On: 01/17/2020 17:53   DG Chest 2 View  Result Date: 01/17/2020 CLINICAL DATA:  59 year old male with sepsis. EXAM: CHEST - 2 VIEW COMPARISON:  Chest radiograph dated 12/25/2004.  FINDINGS: Increased density over the posterior lower lobes, seen on the lateral view, may be artifactual and related to superimposition of the soft tissues or atelectasis. Infiltrate is less likely but not excluded. Clinical correlation is recommended. No lobar consolidation, pleural effusion, or pneumothorax. Top-normal cardiac silhouette. No acute osseous pathology. IMPRESSION: Atelectasis versus developing infiltrate involving the posterior lower lobes. Clinical correlation recommended. Electronically Signed   By: Anner Crete M.D.   On: 01/17/2020 16:05   CT ABDOMEN PELVIS WO CONTRAST  Final Result    DG Chest 2 View  Final Result       Scheduled Meds: . ascorbic acid  500 mg Oral Daily  . Chlorhexidine Gluconate Cloth  6 each Topical Daily  . enoxaparin (LOVENOX) injection  40 mg Subcutaneous Q24H  . gabapentin  300 mg Oral BID  . loperamide  2 mg Oral BID  . mouth rinse  15 mL Mouth Rinse BID  . multivitamin with minerals  1 tablet Oral Daily  . omega-3 acid ethyl esters  1 g Oral BID  . pantoprazole  40 mg Oral Daily  . vitamin B-12  100 mcg Oral Daily  . Vitamin D (Ergocalciferol)  50,000 Units Oral Q7 days   PRN Meds: dicyclomine, hydrALAZINE, HYDROmorphone (DILAUDID) injection, labetalol, metoprolol tartrate, ondansetron **OR** ondansetron (ZOFRAN) IV, psyllium Continuous Infusions: . diltiazem (CARDIZEM) infusion 10 mg/hr (01/19/20 1001)  . meropenem (MERREM) IV Stopped (01/19/20 1053)      LOS: 2 days  Time spent: Greater than 50% of the 35 minute visit was spent in counseling/coordination of care for the patient as laid out in the A&P.   Dwyane Dee, MD [facility] 01/19/2020, 1:04 PM   Contact via secure chat.  To contact the attending provider between 7A-7P or the covering provider during after hours 7P-7A, please log into the web site [email or web address] and access using universal [place] password for that web site. If you do not have the password,  please call the hospital operator.

## 2020-01-20 DIAGNOSIS — B9620 Unspecified Escherichia coli [E. coli] as the cause of diseases classified elsewhere: Secondary | ICD-10-CM

## 2020-01-20 LAB — CBC WITH DIFFERENTIAL/PLATELET
Abs Immature Granulocytes: 0.05 10*3/uL (ref 0.00–0.07)
Basophils Absolute: 0 10*3/uL (ref 0.0–0.1)
Basophils Relative: 0 %
Eosinophils Absolute: 0.1 10*3/uL (ref 0.0–0.5)
Eosinophils Relative: 1 %
HCT: 39.5 % (ref 39.0–52.0)
Hemoglobin: 12 g/dL — ABNORMAL LOW (ref 13.0–17.0)
Immature Granulocytes: 1 %
Lymphocytes Relative: 17 %
Lymphs Abs: 1.6 10*3/uL (ref 0.7–4.0)
MCH: 30.8 pg (ref 26.0–34.0)
MCHC: 30.4 g/dL (ref 30.0–36.0)
MCV: 101.3 fL — ABNORMAL HIGH (ref 80.0–100.0)
Monocytes Absolute: 0.5 10*3/uL (ref 0.1–1.0)
Monocytes Relative: 6 %
Neutro Abs: 7 10*3/uL (ref 1.7–7.7)
Neutrophils Relative %: 75 %
Platelets: 129 10*3/uL — ABNORMAL LOW (ref 150–400)
RBC: 3.9 MIL/uL — ABNORMAL LOW (ref 4.22–5.81)
RDW: 15.1 % (ref 11.5–15.5)
WBC: 9.3 10*3/uL (ref 4.0–10.5)
nRBC: 0 % (ref 0.0–0.2)

## 2020-01-20 LAB — BASIC METABOLIC PANEL
Anion gap: 10 (ref 5–15)
BUN: 17 mg/dL (ref 6–20)
CO2: 22 mmol/L (ref 22–32)
Calcium: 9.9 mg/dL (ref 8.9–10.3)
Chloride: 110 mmol/L (ref 98–111)
Creatinine, Ser: 1.52 mg/dL — ABNORMAL HIGH (ref 0.61–1.24)
GFR calc Af Amer: 59 mL/min — ABNORMAL LOW (ref >60)
GFR calc non Af Amer: 50 mL/min — ABNORMAL LOW (ref >60)
Glucose, Bld: 235 mg/dL — ABNORMAL HIGH (ref 70–99)
Potassium: 4 mmol/L (ref 3.5–5.1)
Sodium: 142 mmol/L (ref 135–145)

## 2020-01-20 LAB — MAGNESIUM: Magnesium: 2.2 mg/dL (ref 1.7–2.4)

## 2020-01-20 LAB — CULTURE, BLOOD (ROUTINE X 2)
Special Requests: ADEQUATE
Special Requests: ADEQUATE

## 2020-01-20 LAB — PHOSPHORUS: Phosphorus: 2 mg/dL — ABNORMAL LOW (ref 2.5–4.6)

## 2020-01-20 LAB — PROCALCITONIN: Procalcitonin: 6.81 ng/mL

## 2020-01-20 MED ORDER — LOPERAMIDE HCL 2 MG PO CAPS
2.0000 mg | ORAL_CAPSULE | Freq: Once | ORAL | Status: AC
  Administered 2020-01-20: 2 mg via ORAL
  Filled 2020-01-20: qty 1

## 2020-01-20 MED ORDER — K PHOS MONO-SOD PHOS DI & MONO 155-852-130 MG PO TABS
500.0000 mg | ORAL_TABLET | Freq: Once | ORAL | Status: AC
  Administered 2020-01-20: 500 mg via ORAL
  Filled 2020-01-20: qty 2

## 2020-01-20 MED ORDER — MELATONIN 3 MG PO TABS
9.0000 mg | ORAL_TABLET | Freq: Every evening | ORAL | Status: DC | PRN
  Administered 2020-01-20 – 2020-01-22 (×4): 9 mg via ORAL
  Filled 2020-01-20 (×4): qty 3

## 2020-01-20 MED ORDER — HYDROMORPHONE HCL 2 MG/ML IJ SOLN
1.5000 mg | INTRAMUSCULAR | Status: DC | PRN
  Administered 2020-01-20 – 2020-01-21 (×8): 1.5 mg via INTRAVENOUS
  Filled 2020-01-20 (×8): qty 1

## 2020-01-20 MED ORDER — SENNOSIDES-DOCUSATE SODIUM 8.6-50 MG PO TABS
1.0000 | ORAL_TABLET | Freq: Two times a day (BID) | ORAL | Status: DC
  Filled 2020-01-20: qty 1

## 2020-01-20 NOTE — Progress Notes (Signed)
Patient ID: Matthew Francis, male   DOB: [birth year], 59 y.o.   MRN: [id number]         [facility]  Date of Admission:  01/17/2020           Day 3 meropenem ASSESSMENT: He is improving on therapy for ESBL E. coli bacteremia.  PLAN: 1. Continue meropenem for 4 more days 2. Please call me for any infectious disease questions this weekend  Principal Problem:   E coli bacteremia Active Problems:   UTI due to extended-spectrum beta lactamase (ESBL) producing Escherichia coli   Severe sepsis (HCC)   AKI (acute kidney injury) (HCC)   Thrombocytopenia (HCC)   Essential hypertension   Ulcerative colitis ([place])   Atrial fibrillation with RVR (HCC)   Scheduled Meds: . ascorbic acid  500 mg Oral Daily  . Chlorhexidine Gluconate Cloth  6 each Topical Daily  . Chlorhexidine Gluconate Cloth  6 each Topical Q0600  . enoxaparin (LOVENOX) injection  40 mg Subcutaneous Q24H  . gabapentin  300 mg Oral BID  . loperamide  2 mg Oral BID  . mouth rinse  15 mL Mouth Rinse BID  . multivitamin with minerals  1 tablet Oral Daily  . mupirocin ointment  1 application Nasal BID  . omega-3 acid ethyl esters  1 g Oral BID  . pantoprazole  40 mg Oral Daily  . senna-docusate  1 tablet Oral BID  . vitamin B-12  100 mcg Oral Daily  . Vitamin D (Ergocalciferol)  50,000 Units Oral Q7 days   Continuous Infusions: . diltiazem (CARDIZEM) infusion 10 mg/hr (01/20/20 1000)  . meropenem (MERREM) IV Stopped (01/20/20 0956)   PRN Meds:.acetaminophen, dicyclomine, hydrALAZINE, HYDROmorphone (DILAUDID) injection, labetalol, melatonin, metoprolol tartrate, ondansetron **OR** ondansetron (ZOFRAN) IV, psyllium, traMADol   SUBJECTIVE: He had a bad night with episodes of lower abdominal pain but is feeling much better now.  Review of Systems: Review of Systems  Constitutional: Negative for fever.  Respiratory: Negative for cough.   Cardiovascular: Negative for chest pain.    Gastrointestinal: Positive for abdominal pain and diarrhea. Negative for nausea and vomiting.  Genitourinary: Negative for dysuria.    Allergies  Allergen Reactions  . Asacol [Mesalamine] Swelling    Throat   . Penicillins Anaphylaxis    Has patient had a PCN reaction causing immediate rash, facial/tongue/throat swelling, SOB or lightheadedness with hypotension: Yes Has patient had a PCN reaction causing severe rash involving mucus membranes or skin necrosis: No Has patient had a PCN reaction that required hospitalization: Yes Has patient had a PCN reaction occurring within the last 10 years: No If all of the above answers are "NO", then may proceed with Cephalosporin use.  . Sulfa Antibiotics Anaphylaxis  . Bee Venom Hives and Swelling  . Iodinated Diagnostic Agents     Pt reports allergy at [facility]. Rapid heartbeat and dyspnea    OBJECTIVE: Vitals:   01/20/20 0630 01/20/20 0800 01/20/20 0900 01/20/20 1000  BP: 115/70 120/73 122/85 129/90  Pulse: (!) 109 (!) 59 87 77  Resp: 10  16 19   Temp:      TempSrc:      SpO2: 97% 98% 96% 99%  Weight:      Height:       Body mass index is 32.31 kg/m.  Physical Exam Constitutional:      General: He is not in acute distress.    Comments: He is in good spirits.  Cardiovascular:  Rate and Rhythm: Normal rate and regular rhythm.     Heart sounds: No murmur heard.   Pulmonary:     Effort: Pulmonary effort is normal.     Breath sounds: Normal breath sounds.  Abdominal:     Palpations: Abdomen is soft.     Tenderness: There is no abdominal tenderness.  Psychiatric:        Mood and Affect: Mood normal.     Lab Results Lab Results  Component Value Date   WBC 9.3 01/20/2020   HGB 12.0 (L) 01/20/2020   HCT 39.5 01/20/2020   MCV 101.3 (H) 01/20/2020   PLT 129 (L) 01/20/2020    Lab Results  Component Value Date   CREATININE 1.52 (H) 01/20/2020   BUN 17 01/20/2020   NA 142 01/20/2020   K 4.0 01/20/2020   CL 110  01/20/2020   CO2 22 01/20/2020    Lab Results  Component Value Date   ALT 64 (H) 01/17/2020   AST 77 (H) 01/17/2020   ALKPHOS 83 01/17/2020   BILITOT 1.5 (H) 01/17/2020     Microbiology: Recent Results (from the past 240 hour(s))  C Difficile Quick Screen w PCR reflex     Status: None   Collection Time: 01/17/20 12:54 PM   Specimen: STOOL  Result Value Ref Range Status   C Diff antigen NEGATIVE NEGATIVE Final   C Diff toxin NEGATIVE NEGATIVE Final   C Diff interpretation No C. difficile detected.  Final    Comment: Performed at [facility], [place] [address], [place], [place] [address]  Blood Culture (routine x 2)     Status: Abnormal   Collection Time: 01/17/20  3:48 PM   Specimen: BLOOD RIGHT HAND  Result Value Ref Range Status   Specimen Description   Final    BLOOD RIGHT HAND Performed at [facility] [address], [place], [place] [address]    Special Requests   Final    BOTTLES DRAWN AEROBIC AND ANAEROBIC Blood Culture adequate volume Performed at [facility] [address], [place], [place] [address]    Culture  Setup Time   Final    GRAM NEGATIVE RODS ANAEROBIC BOTTLE ONLY Organism ID to follow CRITICAL RESULT CALLED TO, READ BACK BY AND VERIFIED WITH: D. Wofford PharmD 9:15 01/18/20 ([email or web address])    Culture (A)  Final    ESCHERICHIA COLI SUSCEPTIBILITIES PERFORMED ON PREVIOUS CULTURE WITHIN THE LAST 5 DAYS. Performed at [facility], [place] [address] [place], [place] [address]    Report Status 01/20/2020 FINAL  Final  Blood Culture (routine x 2)     Status: Abnormal   Collection Time: 01/17/20  3:48 PM   Specimen: BLOOD LEFT HAND  Result Value Ref Range Status   Specimen Description   Final    BLOOD LEFT HAND Performed at [facility] [address], [place], [place] [address]    Special Requests   Final    BOTTLES DRAWN AEROBIC AND ANAEROBIC Blood Culture adequate  volume Performed at [facility] [address] [place], [place] [address]    Culture  Setup Time   Final    GRAM NEGATIVE RODS IN BOTH AEROBIC AND ANAEROBIC BOTTLES CRITICAL VALUE NOTED.  VALUE IS CONSISTENT WITH PREVIOUSLY REPORTED AND CALLED VALUE. Performed at [facility], [place] [address] [place], [place] [address]    Culture (A)  Final    ESCHERICHIA COLI Confirmed Extended  Spectrum Beta-Lactamase Producer (ESBL).  In bloodstream infections from ESBL organisms, carbapenems are preferred over piperacillin/tazobactam. They are shown to have a lower risk of mortality.    Report Status 01/20/2020 FINAL  Final   Organism ID, Bacteria ESCHERICHIA COLI  Final      Susceptibility   Escherichia coli - MIC*    AMPICILLIN >=32 RESISTANT Resistant     CEFAZOLIN >=64 RESISTANT Resistant     CEFEPIME >=32 RESISTANT Resistant     CEFTAZIDIME >=64 RESISTANT Resistant     CEFTRIAXONE >=64 RESISTANT Resistant     CIPROFLOXACIN >=4 RESISTANT Resistant     GENTAMICIN <=1 SENSITIVE Sensitive     IMIPENEM <=0.25 SENSITIVE Sensitive     TRIMETH/SULFA >=320 RESISTANT Resistant     AMPICILLIN/SULBACTAM >=32 RESISTANT Resistant     PIP/TAZO 8 SENSITIVE Sensitive     * ESCHERICHIA COLI  Urine culture     Status: Abnormal   Collection Time: 01/17/20  3:48 PM   Specimen: In/Out Cath Urine  Result Value Ref Range Status   Specimen Description   Final    IN/OUT CATH URINE Performed at [facility] [address], [place], [place] [address]    Special Requests   Final    NONE Performed at [facility], [place] [address], [place], [place] [address]    Culture (A)  Final    >=100,000 COLONIES/mL ESCHERICHIA COLI Confirmed Extended Spectrum Beta-Lactamase Producer (ESBL).  In bloodstream infections from ESBL organisms, carbapenems are preferred over piperacillin/tazobactam. They are shown to have a lower risk of mortality.    Report  Status 01/19/2020 FINAL  Final   Organism ID, Bacteria ESCHERICHIA COLI (A)  Final      Susceptibility   Escherichia coli - MIC*    AMPICILLIN >=32 RESISTANT Resistant     CEFAZOLIN >=64 RESISTANT Resistant     CEFTRIAXONE >=64 RESISTANT Resistant     CIPROFLOXACIN >=4 RESISTANT Resistant     GENTAMICIN <=1 SENSITIVE Sensitive     IMIPENEM <=0.25 SENSITIVE Sensitive     NITROFURANTOIN 32 SENSITIVE Sensitive     TRIMETH/SULFA >=320 RESISTANT Resistant     AMPICILLIN/SULBACTAM >=32 RESISTANT Resistant     PIP/TAZO 8 SENSITIVE Sensitive     * >=100,000 COLONIES/mL ESCHERICHIA COLI  Blood Culture ID Panel (Reflexed)     Status: Abnormal   Collection Time: 01/17/20  3:48 PM  Result Value Ref Range Status   Enterococcus faecalis NOT DETECTED NOT DETECTED Final   Enterococcus Faecium NOT DETECTED NOT DETECTED Final   Listeria monocytogenes NOT DETECTED NOT DETECTED Final   Staphylococcus species NOT DETECTED NOT DETECTED Final   Staphylococcus aureus (BCID) NOT DETECTED NOT DETECTED Final   Staphylococcus epidermidis NOT DETECTED NOT DETECTED Final   Staphylococcus lugdunensis NOT DETECTED NOT DETECTED Final   Streptococcus species NOT DETECTED NOT DETECTED Final   Streptococcus agalactiae NOT DETECTED NOT DETECTED Final   Streptococcus pneumoniae NOT DETECTED NOT DETECTED Final   Streptococcus pyogenes NOT DETECTED NOT DETECTED Final   A.calcoaceticus-baumannii NOT DETECTED NOT DETECTED Final   Bacteroides fragilis NOT DETECTED NOT DETECTED Final   Enterobacterales DETECTED (A) NOT DETECTED Final    Comment: Enterobacterales represent a large order of gram negative bacteria, not a single organism.   Enterobacter cloacae complex NOT DETECTED NOT DETECTED Final   Escherichia coli DETECTED (A) NOT DETECTED Final    Comment: CRITICAL RESULT CALLED TO, READ BACK BY AND VERIFIED WITH: D. Wofford PharmD 9:15 01/18/20 ([email or web address])  Klebsiella aerogenes NOT DETECTED NOT DETECTED Final    Klebsiella oxytoca NOT DETECTED NOT DETECTED Final   Klebsiella pneumoniae NOT DETECTED NOT DETECTED Final   Proteus species NOT DETECTED NOT DETECTED Final   Salmonella species NOT DETECTED NOT DETECTED Final   Serratia marcescens NOT DETECTED NOT DETECTED Final   Haemophilus influenzae NOT DETECTED NOT DETECTED Final   Neisseria meningitidis NOT DETECTED NOT DETECTED Final   Pseudomonas aeruginosa NOT DETECTED NOT DETECTED Final   Stenotrophomonas maltophilia NOT DETECTED NOT DETECTED Final   Candida albicans NOT DETECTED NOT DETECTED Final   Candida auris NOT DETECTED NOT DETECTED Final   Candida glabrata NOT DETECTED NOT DETECTED Final   Candida krusei NOT DETECTED NOT DETECTED Final   Candida parapsilosis NOT DETECTED NOT DETECTED Final   Candida tropicalis NOT DETECTED NOT DETECTED Final   Cryptococcus neoformans/gattii NOT DETECTED NOT DETECTED Final   CTX-M ESBL DETECTED (A) NOT DETECTED Final    Comment: CRITICAL RESULT CALLED TO, READ BACK BY AND VERIFIED WITH: D. Wofford PharmD 9:15 01/18/20 ([email or web address]) (NOTE) Extended spectrum beta-lactamase detected. Recommend a carbapenem as initial therapy.      Carbapenem resistance IMP NOT DETECTED NOT DETECTED Final   Carbapenem resistance KPC NOT DETECTED NOT DETECTED Final   Carbapenem resistance NDM NOT DETECTED NOT DETECTED Final   Carbapenem resist OXA 48 LIKE NOT DETECTED NOT DETECTED Final   Carbapenem resistance VIM NOT DETECTED NOT DETECTED Final    Comment: Performed at [facility], [place] [address] [place], [place] [address]  SARS Coronavirus 2 by RT PCR (hospital order, performed in [facility] hospital lab) Nasopharyngeal Nasopharyngeal Swab     Status: None   Collection Time: 01/17/20  6:40 PM   Specimen: Nasopharyngeal Swab  Result Value Ref Range Status   SARS Coronavirus 2 NEGATIVE NEGATIVE Final    Comment: (NOTE) SARS-CoV-2 target nucleic acids are NOT DETECTED.  The SARS-CoV-2 RNA is generally detectable  in upper and lower respiratory specimens during the acute phase of infection. The lowest concentration of SARS-CoV-2 viral copies this assay can detect is 250 copies / mL. A negative result does not preclude SARS-CoV-2 infection and should not be used as the sole basis for treatment or other patient management decisions.  A negative result may occur with improper specimen collection / handling, submission of specimen other than nasopharyngeal swab, presence of viral mutation(s) within the areas targeted by this assay, and inadequate number of viral copies (<250 copies / mL). A negative result must be combined with clinical observations, patient history, and epidemiological information.  Fact Sheet for Patients:   StrictlyIdeas.no  Fact Sheet for Healthcare Providers: BankingDealers.co.za  This test is not yet approved or  cleared by the [place] FDA and has been authorized for detection and/or diagnosis of SARS-CoV-2 by FDA under an Emergency Use Authorization (EUA).  This EUA will remain in effect (meaning this test can be used) for the duration of the COVID-19 declaration under Section 564(b)(1) of the Act, 21 U.S.C. section 360bbb-3(b)(1), unless the authorization is terminated or revoked sooner.  Performed at [facility], [place] [address] [place],  [address]   MRSA PCR Screening     Status: Abnormal   Collection Time: 01/19/20  7:46 AM   Specimen: Nasal Mucosa; Nasopharyngeal  Result Value Ref Range Status   MRSA by PCR POSITIVE (A) NEGATIVE Final    Comment:        The GeneXpert MRSA Assay (FDA approved for NASAL  specimens only), is one component of a comprehensive MRSA colonization surveillance program. It is not intended to diagnose MRSA infection nor to guide or monitor treatment for MRSA infections. RESULT CALLED TO, READ BACK BY AND VERIFIED WITH: H.SEGERS AT 1341 ON 01/19/20 BY  N.THOMPSON Performed at [facility], [place] [address], [place], [place] [address]     Michel Bickers, [facility] [phone] pager   [phone] cell 01/20/2020, 11:08 AM

## 2020-01-20 NOTE — Progress Notes (Signed)
PROGRESS NOTE    Matthew Francis   [id number]  DOB: 1961-05-24  DOA: 01/17/2020     3  PCP: Charline Bills, MD  CC: Abdominal pain, fevers, chills, back pain  Hospital Course: Matthew Francis is a 59 y.o. male with history of ulcerative colitis with total colectomy in 7564 complicated by pouchitis for which patient was recently started on prednisone by patient's gastroenterologist in [facility].  Patient is also on Cipro for anti-inflammatory properties for the same reason.   He began developing fevers, chills, low back pain, and worsening abdominal pain which prompted him to come to the ER for further evaluation. He denied any cough, shortness of breath, vomiting, nor chest pain. In the ER he was found to be mildly febrile, 100.3 with elevated lactic acid, 3. He also had leukocytosis, 20.7 and elevated creatinine, 1.58. Urinalysis was concerning for UTI and as he appeared septic, he was started on vancomycin, cefepime, Flagyl. Blood cultures were also collected. COVID-19 test was also performed and was negative. Soon after admission, blood cultures returned positive for 3/4 bottles with ESBL E. coli. His antibiotics were modified to monotherapy meropenem. Repeat blood cultures were drawn on 01/19/2020.  Infectious disease was also consulted for further recommendations regarding length of course of antibiotics.  He was recommended to complete a 7-day course total of meropenem, which would make the end date 01/25/2020 (first day of negative cultures considered 01/19/2020).  He also converted into A. fib with RVR during hospitalization.  This was considered provoked in setting of severe sepsis infection.  He had no prior history of A. fib.  He was started on a Cardizem infusion and spontaneously converted back to NSR on 01/20/2020.  Cardizem infusion was tapered after conversion.   Interval History:  Patient had difficulty getting help from staff yesterday evening he states.  Was trying to get  assistance using the bathroom, then ultimately soiled the bed and was then changed.  Validated the patient's concerns and complaints this morning and apologized on behalf of the hospital. He was forgiving and understood time constraints of staff.  We stated that we would try to address this to prevent any recurrence in the future. He also endorses ongoing abdominal pain and required addition of fentanyl overnight in addition to his Dilaudid.  Stated that we would increase his Dilaudid dose and try to remain on one opioid only.  He was also okay with this plan. Overall he feels much better than on admission with improved but as noted still ongoing abdominal pain.  Back pain has substantially improved as well.  He is also having bowel movements and passing flatus.  Denies any nausea or vomiting.  Old records reviewed in assessment of this patient  ROS: Constitutional: positive for chills, fevers and malaise, Respiratory: negative for cough, Cardiovascular: negative for chest pain and Gastrointestinal: positive for abdominal pain  Assessment & Plan: Severe sepsis ([place]) - met SIRS on admission with source of infection at this time felt to be urinary and has now translocated to blood (E. Coli ESBL). HR 133, RR 36, WBC 20.7, Lactic 3 initially - s/p vanc, cefepime, [name]. This was changed to meropenem as blood cultures resulted with ESBL E. coli -Lactic normalized with trending -Continue fluids -Continue pain control.  Pain has been difficult to control with modification to regimen as needed -Cultures repeated on 01/19/2020.  Continue following -Tentative stop date for meropenem will be 01/25/2020 (7-day course) as per ID recommendation. -Hydrocortisone  initially started on admission has been tapered off.  BP remained stable  Acute lower UTI -See severe sepsis  AKI (acute kidney injury) ([place]) -Continue fluids -Renal function improving with fluids -Repeat BMP in  a.m.  Thrombocytopenia ([place]) -Considered due to sepsis; platelets have been recovering with infection being treated  Essential hypertension -Use labetalol or hydralazine as needed. 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 Patient on chronic Cipro for anti-inflammatory properties and intermittent prednisone  E coli bacteremia -See severe sepsis -Admission cultures growing 3/4 bottles with ESBL E. coli -Repeat blood cultures on 01/19/2020 and follow for clearance -Appreciate ID consult.  7-day course of meropenem recommended.  End date 01/25/2020  Atrial fibrillation with RVR ([place]) -Patient converted into A. fib on night of 01/18/2020.  No prior history of A. fib and he has had a cardiac work-up in the past for other reasons with no arrhythmia noted at that time -Etiology is considered in setting of severe sepsis infection with bacteremia.  No further work-up at this time unless reoccurs outside setting of severe infection.  Discussed with patient as well -Continue Cardizem and will titrate off as able; converted to NSR on 01/20/2020  UTI due to extended-spectrum beta lactamase (ESBL) producing Escherichia coli - see severe sepsis   Antimicrobials: Vancomycin, cefepime, Flagyl 01/17/2020 -01/18/2020 Meropenem 01/18/2020>> present.  Tentative stop date 01/25/2020  DVT prophylaxis: Lovenox Code Status: Full Family Communication: None present Disposition Plan:  Status is: Inpatient  Remains inpatient appropriate because:Ongoing active pain requiring inpatient pain management, Unsafe d/c plan, IV treatments appropriate due to intensity of illness or inability to take PO and Inpatient level of care appropriate due to severity of illness   Dispo: The patient is from: Home              Anticipated d/c is to: Home              Anticipated d/c date is: > 3 days              Patient currently is not  medically stable to d/c.   Objective: Blood pressure 129/90, pulse 77, temperature 97.9 F (36.6 C), temperature source Oral, resp. rate 19, height 5' 10.75" (1.797 m), weight 104.3 kg, SpO2 99 %.  Examination: General appearance: Adult man resting in bed in no distress and appears much more comfortable than yesterday. Head: Normocephalic, without obvious abnormality, atraumatic Eyes: EOMI Lungs: clear to auscultation bilaterally Heart: irregularly irregular rhythm and S1, S2 normal Abdomen: Vertical scar appreciated from epigastrium to umbilicus with indentation in the midline and other scattered scars. Soft, nondistended, bowel sounds present. Tenderness to palpation throughout which is much improved with no rebound or guarding.  Back: Bilateral CVA tenderness now minimal compared to previous exam Extremities: No edema Skin: mobility and turgor normal Neurologic: Grossly normal   Consultants:   ID  Procedures:   None  Data Reviewed: I have personally reviewed following labs and imaging studies Results for orders placed or performed during the hospital encounter of 01/17/20 (from the past 24 hour(s))  Procalcitonin - Baseline     Status: None   Collection Time: 01/19/20  1:28 PM  Result Value Ref Range   Procalcitonin 8.57 ng/mL  Basic metabolic panel     Status: Abnormal   Collection Time: 01/20/20  2:22 AM  Result Value Ref Range   Sodium 142 135 - 145 mmol/L   Potassium 4.0 3.5 -  5.1 mmol/L   Chloride 110 98 - 111 mmol/L   CO2 22 22 - 32 mmol/L   Glucose, Bld 235 (H) 70 - 99 mg/dL   BUN 17 6 - 20 mg/dL   Creatinine, Ser 1.52 (H) 0.61 - 1.24 mg/dL   Calcium 9.9 8.9 - 10.3 mg/dL   GFR calc non Af Amer 50 (L) >60 mL/min   GFR calc Af Amer 59 (L) >60 mL/min   Anion gap 10 5 - 15  CBC with Differential/Platelet     Status: Abnormal   Collection Time: 01/20/20  2:22 AM  Result Value Ref Range   WBC 9.3 4.0 - 10.5 K/uL   RBC 3.90 (L) 4.22 - 5.81 MIL/uL   Hemoglobin  12.0 (L) 13.0 - 17.0 g/dL   HCT 39.5 39 - 52 %   MCV 101.3 (H) 80.0 - 100.0 fL   MCH 30.8 26.0 - 34.0 pg   MCHC 30.4 30.0 - 36.0 g/dL   RDW 15.1 11.5 - 15.5 %   Platelets 129 (L) 150 - 400 K/uL   nRBC 0.0 0.0 - 0.2 %   Neutrophils Relative % 75 %   Neutro Abs 7.0 1.7 - 7.7 K/uL   Lymphocytes Relative 17 %   Lymphs Abs 1.6 0.7 - 4.0 K/uL   Monocytes Relative 6 %   Monocytes Absolute 0.5 0 - 1 K/uL   Eosinophils Relative 1 %   Eosinophils Absolute 0.1 0 - 0 K/uL   Basophils Relative 0 %   Basophils Absolute 0.0 0 - 0 K/uL   Immature Granulocytes 1 %   Abs Immature Granulocytes 0.05 0.00 - 0.07 K/uL  Magnesium     Status: None   Collection Time: 01/20/20  2:22 AM  Result Value Ref Range   Magnesium 2.2 1.7 - 2.4 mg/dL  Phosphorus     Status: Abnormal   Collection Time: 01/20/20  2:22 AM  Result Value Ref Range   Phosphorus 2.0 (L) 2.5 - 4.6 mg/dL  Procalcitonin     Status: None   Collection Time: 01/20/20  2:22 AM  Result Value Ref Range   Procalcitonin 6.81 ng/mL    Recent Results (from the past 240 hour(s))  C Difficile Quick Screen w PCR reflex     Status: None   Collection Time: 01/17/20 12:54 PM   Specimen: STOOL  Result Value Ref Range Status   C Diff antigen NEGATIVE NEGATIVE Final   C Diff toxin NEGATIVE NEGATIVE Final   C Diff interpretation No C. difficile detected.  Final    Comment: Performed at [facility], [place] [address] [place], [place] [address]  Blood Culture (routine x 2)     Status: Abnormal   Collection Time: 01/17/20  3:48 PM   Specimen: BLOOD RIGHT HAND  Result Value Ref Range Status   Specimen Description   Final    BLOOD RIGHT HAND Performed at [facility] [address] [place], [place] [address]    Special Requests   Final    BOTTLES DRAWN AEROBIC AND ANAEROBIC Blood Culture adequate volume Performed at [facility] [address], [place], [place] [address]    Culture  Setup  Time   Final    GRAM NEGATIVE RODS ANAEROBIC BOTTLE ONLY Organism ID to follow CRITICAL RESULT CALLED TO, READ BACK BY AND VERIFIED WITH: D. Wofford PharmD 9:15 01/18/20 ([email or web address])    Culture (A)  Final    ESCHERICHIA COLI SUSCEPTIBILITIES PERFORMED ON PREVIOUS  CULTURE WITHIN THE LAST 5 DAYS. Performed at [facility], [place] [address], [place], [place] [address]    Report Status 01/20/2020 FINAL  Final  Blood Culture (routine x 2)     Status: Abnormal   Collection Time: 01/17/20  3:48 PM   Specimen: BLOOD LEFT HAND  Result Value Ref Range Status   Specimen Description   Final    BLOOD LEFT HAND Performed at [facility] [address] [place], [place] [address]    Special Requests   Final    BOTTLES DRAWN AEROBIC AND ANAEROBIC Blood Culture adequate volume Performed at [facility] [address] [place], [place] [address]    Culture  Setup Time   Final    GRAM NEGATIVE RODS IN BOTH AEROBIC AND ANAEROBIC BOTTLES CRITICAL VALUE NOTED.  VALUE IS CONSISTENT WITH PREVIOUSLY REPORTED AND CALLED VALUE. Performed at [facility], [place] [address] [place], [place] [address]    Culture (A)  Final    ESCHERICHIA COLI Confirmed Extended Spectrum Beta-Lactamase Producer (ESBL).  In bloodstream infections from ESBL organisms, carbapenems are preferred over piperacillin/tazobactam. They are shown to have a lower risk of mortality.    Report Status 01/20/2020 FINAL  Final   Organism ID, Bacteria ESCHERICHIA COLI  Final      Susceptibility   Escherichia coli - MIC*    AMPICILLIN >=32 RESISTANT Resistant     CEFAZOLIN >=64 RESISTANT Resistant     CEFEPIME >=32 RESISTANT Resistant     CEFTAZIDIME >=64 RESISTANT Resistant     CEFTRIAXONE >=64 RESISTANT Resistant     CIPROFLOXACIN >=4 RESISTANT Resistant     GENTAMICIN <=1 SENSITIVE Sensitive     IMIPENEM <=0.25 SENSITIVE Sensitive     TRIMETH/SULFA >=320 RESISTANT Resistant      AMPICILLIN/SULBACTAM >=32 RESISTANT Resistant     PIP/TAZO 8 SENSITIVE Sensitive     * ESCHERICHIA COLI  Urine culture     Status: Abnormal   Collection Time: 01/17/20  3:48 PM   Specimen: In/Out Cath Urine  Result Value Ref Range Status   Specimen Description   Final    IN/OUT CATH URINE Performed at [facility] [address] [place], [place] [address]    Special Requests   Final    NONE Performed at [facility], [place] [address], [place], [place] [address]    Culture (A)  Final    >=100,000 COLONIES/mL ESCHERICHIA COLI Confirmed Extended Spectrum Beta-Lactamase Producer (ESBL).  In bloodstream infections from ESBL organisms, carbapenems are preferred over piperacillin/tazobactam. They are shown to have a lower risk of mortality.    Report Status 01/19/2020 FINAL  Final   Organism ID, Bacteria ESCHERICHIA COLI (A)  Final      Susceptibility   Escherichia coli - MIC*    AMPICILLIN >=32 RESISTANT Resistant     CEFAZOLIN >=64 RESISTANT Resistant     CEFTRIAXONE >=64 RESISTANT Resistant     CIPROFLOXACIN >=4 RESISTANT Resistant     GENTAMICIN <=1 SENSITIVE Sensitive     IMIPENEM <=0.25 SENSITIVE Sensitive     NITROFURANTOIN 32 SENSITIVE Sensitive     TRIMETH/SULFA >=320 RESISTANT Resistant     AMPICILLIN/SULBACTAM >=32 RESISTANT Resistant     PIP/TAZO 8 SENSITIVE Sensitive     * >=100,000 COLONIES/mL ESCHERICHIA COLI  Blood Culture ID Panel (Reflexed)     Status: Abnormal   Collection Time: 01/17/20  3:48 PM  Result Value Ref Range Status  Enterococcus faecalis NOT DETECTED NOT DETECTED Final   Enterococcus Faecium NOT DETECTED NOT DETECTED Final   Listeria monocytogenes NOT DETECTED NOT DETECTED Final   Staphylococcus species NOT DETECTED NOT DETECTED Final   Staphylococcus aureus (BCID) NOT DETECTED NOT DETECTED Final   Staphylococcus epidermidis NOT DETECTED NOT DETECTED Final   Staphylococcus lugdunensis NOT DETECTED NOT DETECTED  Final   Streptococcus species NOT DETECTED NOT DETECTED Final   Streptococcus agalactiae NOT DETECTED NOT DETECTED Final   Streptococcus pneumoniae NOT DETECTED NOT DETECTED Final   Streptococcus pyogenes NOT DETECTED NOT DETECTED Final   A.calcoaceticus-baumannii NOT DETECTED NOT DETECTED Final   Bacteroides fragilis NOT DETECTED NOT DETECTED Final   Enterobacterales DETECTED (A) NOT DETECTED Final    Comment: Enterobacterales represent a large order of gram negative bacteria, not a single organism.   Enterobacter cloacae complex NOT DETECTED NOT DETECTED Final   Escherichia coli DETECTED (A) NOT DETECTED Final    Comment: CRITICAL RESULT CALLED TO, READ BACK BY AND VERIFIED WITH: D. Wofford PharmD 9:15 01/18/20 ([email or web address])    Klebsiella aerogenes NOT DETECTED NOT DETECTED Final   Klebsiella oxytoca NOT DETECTED NOT DETECTED Final   Klebsiella pneumoniae NOT DETECTED NOT DETECTED Final   Proteus species NOT DETECTED NOT DETECTED Final   Salmonella species NOT DETECTED NOT DETECTED Final   Serratia marcescens NOT DETECTED NOT DETECTED Final   Haemophilus influenzae NOT DETECTED NOT DETECTED Final   Neisseria meningitidis NOT DETECTED NOT DETECTED Final   Pseudomonas aeruginosa NOT DETECTED NOT DETECTED Final   Stenotrophomonas maltophilia NOT DETECTED NOT DETECTED Final   Candida albicans NOT DETECTED NOT DETECTED Final   Candida auris NOT DETECTED NOT DETECTED Final   Candida glabrata NOT DETECTED NOT DETECTED Final   Candida krusei NOT DETECTED NOT DETECTED Final   Candida parapsilosis NOT DETECTED NOT DETECTED Final   Candida tropicalis NOT DETECTED NOT DETECTED Final   Cryptococcus neoformans/gattii NOT DETECTED NOT DETECTED Final   CTX-M ESBL DETECTED (A) NOT DETECTED Final    Comment: CRITICAL RESULT CALLED TO, READ BACK BY AND VERIFIED WITH: D. Wofford PharmD 9:15 01/18/20 ([email or web address]) (NOTE) Extended spectrum beta-lactamase detected. Recommend a carbapenem as initial therapy.       Carbapenem resistance IMP NOT DETECTED NOT DETECTED Final   Carbapenem resistance KPC NOT DETECTED NOT DETECTED Final   Carbapenem resistance NDM NOT DETECTED NOT DETECTED Final   Carbapenem resist OXA 48 LIKE NOT DETECTED NOT DETECTED Final   Carbapenem resistance VIM NOT DETECTED NOT DETECTED Final    Comment: Performed at [facility], [place] [address], [place], [place] [address]  SARS Coronavirus 2 by RT PCR (hospital order, performed in [facility] hospital lab) Nasopharyngeal Nasopharyngeal Swab     Status: None   Collection Time: 01/17/20  6:40 PM   Specimen: Nasopharyngeal Swab  Result Value Ref Range Status   SARS Coronavirus 2 NEGATIVE NEGATIVE Final    Comment: (NOTE) SARS-CoV-2 target nucleic acids are NOT DETECTED.  The SARS-CoV-2 RNA is generally detectable in upper and lower respiratory specimens during the acute phase of infection. The lowest concentration of SARS-CoV-2 viral copies this assay can detect is 250 copies / mL. A negative result does not preclude SARS-CoV-2 infection and should not be used as the sole basis for treatment or other patient management decisions.  A negative result may occur with improper specimen collection / handling, submission of specimen other than nasopharyngeal swab, presence of viral mutation(s) within the areas targeted by this assay, and  inadequate number of viral copies (<250 copies / mL). A negative result must be combined with clinical observations, patient history, and epidemiological information.  Fact Sheet for Patients:   StrictlyIdeas.no  Fact Sheet for Healthcare Providers: BankingDealers.co.za  This test is not yet approved or  cleared by the [place] FDA and has been authorized for detection and/or diagnosis of SARS-CoV-2 by FDA under an Emergency Use Authorization (EUA).  This EUA will remain in effect (meaning this test can be used) for the duration of  the COVID-19 declaration under Section 564(b)(1) of the Act, 21 U.S.C. section 360bbb-3(b)(1), unless the authorization is terminated or revoked sooner.  Performed at [facility], [place] [address] [place], [place] [address]   MRSA PCR Screening     Status: Abnormal   Collection Time: 01/19/20  7:46 AM   Specimen: Nasal Mucosa; Nasopharyngeal  Result Value Ref Range Status   MRSA by PCR POSITIVE (A) NEGATIVE Final    Comment:        The GeneXpert MRSA Assay (FDA approved for NASAL specimens only), is one component of a comprehensive MRSA colonization surveillance program. It is not intended to diagnose MRSA infection nor to guide or monitor treatment for MRSA infections. RESULT CALLED TO, READ BACK BY AND VERIFIED WITH: H.SEGERS AT 1341 ON 01/19/20 BY N.THOMPSON Performed at [facility], [place] [address], [place], [place] [address]      Radiology Studies: No results found. CT ABDOMEN PELVIS WO CONTRAST  Final Result    DG Chest 2 View  Final Result       Scheduled Meds: . ascorbic acid  500 mg Oral Daily  . Chlorhexidine Gluconate Cloth  6 each Topical Daily  . Chlorhexidine Gluconate Cloth  6 each Topical Q0600  . enoxaparin (LOVENOX) injection  40 mg Subcutaneous Q24H  . gabapentin  300 mg Oral BID  . loperamide  2 mg Oral BID  . mouth rinse  15 mL Mouth Rinse BID  . multivitamin with minerals  1 tablet Oral Daily  . mupirocin ointment  1 application Nasal BID  . omega-3 acid ethyl esters  1 g Oral BID  . pantoprazole  40 mg Oral Daily  . senna-docusate  1 tablet Oral BID  . vitamin B-12  100 mcg Oral Daily  . Vitamin D (Ergocalciferol)  50,000 Units Oral Q7 days   PRN Meds: acetaminophen, dicyclomine, hydrALAZINE, HYDROmorphone (DILAUDID) injection, labetalol, melatonin, metoprolol tartrate, ondansetron **OR** ondansetron (ZOFRAN) IV, psyllium, traMADol Continuous Infusions: . diltiazem (CARDIZEM) infusion 10 mg/hr  (01/20/20 1000)  . meropenem (MERREM) IV Stopped (01/20/20 0956)      LOS: 3 days  Time spent: Greater than 50% of the 35 minute visit was spent in counseling/coordination of care for the patient as laid out in the A&P.   Matthew Dee, MD [facility] 01/20/2020, 12:06 PM   Contact via secure chat.  To contact the attending provider between 7A-7P or the covering provider during after hours 7P-7A, please log into the web site [email or web address] and access using universal [place] password for that web site. If you do not have the password, please call the hospital operator.

## 2020-01-20 NOTE — Assessment & Plan Note (Signed)
-   see severe sepsis

## 2020-01-21 LAB — BASIC METABOLIC PANEL
Anion gap: 10 (ref 5–15)
BUN: 14 mg/dL (ref 6–20)
CO2: 22 mmol/L (ref 22–32)
Calcium: 9.9 mg/dL (ref 8.9–10.3)
Chloride: 107 mmol/L (ref 98–111)
Creatinine, Ser: 1.42 mg/dL — ABNORMAL HIGH (ref 0.61–1.24)
GFR calc Af Amer: >60 mL/min (ref >60)
GFR calc non Af Amer: 55 mL/min — ABNORMAL LOW (ref >60)
Glucose, Bld: 142 mg/dL — ABNORMAL HIGH (ref 70–99)
Potassium: 3.8 mmol/L (ref 3.5–5.1)
Sodium: 139 mmol/L (ref 135–145)

## 2020-01-21 LAB — PHOSPHORUS: Phosphorus: 2.9 mg/dL (ref 2.5–4.6)

## 2020-01-21 LAB — CBC WITH DIFFERENTIAL/PLATELET
Abs Immature Granulocytes: 0.05 10*3/uL (ref 0.00–0.07)
Basophils Absolute: 0 10*3/uL (ref 0.0–0.1)
Basophils Relative: 0 %
Eosinophils Absolute: 0.1 10*3/uL (ref 0.0–0.5)
Eosinophils Relative: 2 %
HCT: 36.9 % — ABNORMAL LOW (ref 39.0–52.0)
Hemoglobin: 11.4 g/dL — ABNORMAL LOW (ref 13.0–17.0)
Immature Granulocytes: 1 %
Lymphocytes Relative: 19 %
Lymphs Abs: 1.2 10*3/uL (ref 0.7–4.0)
MCH: 30.9 pg (ref 26.0–34.0)
MCHC: 30.9 g/dL (ref 30.0–36.0)
MCV: 100 fL (ref 80.0–100.0)
Monocytes Absolute: 0.5 10*3/uL (ref 0.1–1.0)
Monocytes Relative: 7 %
Neutro Abs: 4.5 10*3/uL (ref 1.7–7.7)
Neutrophils Relative %: 71 %
Platelets: 141 10*3/uL — ABNORMAL LOW (ref 150–400)
RBC: 3.69 MIL/uL — ABNORMAL LOW (ref 4.22–5.81)
RDW: 14.6 % (ref 11.5–15.5)
WBC: 6.4 10*3/uL (ref 4.0–10.5)
nRBC: 0 % (ref 0.0–0.2)

## 2020-01-21 LAB — MAGNESIUM: Magnesium: 2.1 mg/dL (ref 1.7–2.4)

## 2020-01-21 LAB — PROCALCITONIN: Procalcitonin: 4.25 ng/mL

## 2020-01-21 MED ORDER — SENNOSIDES-DOCUSATE SODIUM 8.6-50 MG PO TABS: 1.0000 | ORAL_TABLET | Freq: Every evening | ORAL | Status: DC | PRN

## 2020-01-21 MED ORDER — HYDROMORPHONE HCL 1 MG/ML IJ SOLN
1.5000 mg | INTRAMUSCULAR | Status: DC | PRN
  Administered 2020-01-21 – 2020-01-22 (×4): 1.5 mg via INTRAVENOUS
  Filled 2020-01-21 (×4): qty 1.5

## 2020-01-21 NOTE — Progress Notes (Signed)
PROGRESS NOTE    Matthew Francis   [id number]  DOB: Jun 03, 1961  DOA: 01/17/2020     4  PCP: Charline Bills, MD  CC: Abdominal pain, fevers, chills, back pain  Hospital Course: Matthew Francis is a 59 y.o. male with history of ulcerative colitis with total colectomy in 6387 complicated by pouchitis for which patient was recently started on prednisone by patient's gastroenterologist in [facility].  Patient is also on Cipro for anti-inflammatory properties for the same reason.   He began developing fevers, chills, low back pain, and worsening abdominal pain which prompted him to come to the ER for further evaluation. He denied any cough, shortness of breath, vomiting, nor chest pain. In the ER he was found to be mildly febrile, 100.3 with elevated lactic acid, 3. He also had leukocytosis, 20.7 and elevated creatinine, 1.58. Urinalysis was concerning for UTI and as he appeared septic, he was started on vancomycin, cefepime, Flagyl. Blood cultures were also collected. COVID-19 test was also performed and was negative. Soon after admission, blood cultures returned positive for 3/4 bottles with ESBL E. coli. His antibiotics were modified to monotherapy meropenem. Repeat blood cultures were drawn on 01/19/2020.  Infectious disease was also consulted for further recommendations regarding length of course of antibiotics.  He was recommended to complete a 7-day course total of meropenem, for end date of 01/24/2020.  He also converted into A. fib with RVR during hospitalization.  This was considered provoked in setting of severe sepsis infection.  He had no prior history of A. fib.  He was started on a Cardizem infusion and spontaneously converted back to NSR on 01/20/2020.  Cardizem infusion was tapered after conversion and he remained in NSR and was able to be transferred out of SDU to tele.    Interval History:  No events overnight. Says he actually is finally starting to feel better. Still has abd pain  but more controlled now on current regimen; no constipation, actually some diarrhea consistent with his typical colitis. No nausea. Still some back pain B/L but getting better daily.   Old records reviewed in assessment of this patient  ROS: Constitutional: positive for chills, fevers and malaise, Respiratory: negative for cough, Cardiovascular: negative for chest pain and Gastrointestinal: positive for abdominal pain  Assessment & Plan: Severe sepsis ([place]) - met SIRS on admission with source of infection at this time felt to be urinary and has now translocated to blood (E. Coli ESBL). HR 133, RR 36, WBC 20.7, Lactic 3 initially -Hydrocortisone initially started on admission has been tapered off.  BP remained stable - s/p vanc, cefepime, [name]. This was changed to meropenem as blood cultures resulted with ESBL E. coli -Lactic normalized with trending -IVF off, he is eating well -Continue pain control.  Pain has been difficult to control with modification to regimen as needed -Cultures repeated on 01/19/2020.  Continue following. Remain NGTD -Tentative stop date for meropenem will be 01/24/2020 (7-day course) as per ID recommendation.  Acute lower UTI -See severe sepsis  AKI (acute kidney injury) ([place]) -Renal function improved with IVF; now maintaining diet -Repeat BMP in a.m.  Thrombocytopenia ([place]) -Considered due to sepsis; platelets have been recovering with infection being treated  Essential hypertension -Use labetalol or hydralazine as needed. 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 Patient on chronic Cipro for anti-inflammatory properties and intermittent prednisone  E coli bacteremia -  See severe sepsis -Admission cultures growing 3/4 bottles with ESBL E. coli -Repeat blood cultures on 01/19/2020 and follow for clearance -Appreciate ID consult.  7-day course of  meropenem recommended.  End date 01/24/2020  Atrial fibrillation with RVR ([name]) -Patient converted into A. fib on night of 01/18/2020.  No prior history of A. fib and he has had a cardiac work-up in the past for other reasons with no arrhythmia noted at that time -Etiology is considered in setting of severe sepsis infection with bacteremia.  No further work-up at this time unless reoccurs outside setting of severe infection.  Discussed with patient as well -Continue Cardizem and will titrate off as able; converted to NSR on 01/20/2020 - remains in NSR, occasional sinus tach not unexpected in setting of resolving infection and some deconditioning - transfer to tele  UTI due to extended-spectrum beta lactamase (ESBL) producing Escherichia coli - see severe sepsis   Antimicrobials: Vancomycin, cefepime, Flagyl 01/17/2020 -01/18/2020 Meropenem 01/18/2020>> present.  Tentative stop date 01/24/2020  DVT prophylaxis: Lovenox Code Status: Full Family Communication: None present Disposition Plan:  Status is: Inpatient  Remains inpatient appropriate because:Ongoing active pain requiring inpatient pain management, Unsafe d/c plan, IV treatments appropriate due to intensity of illness or inability to take PO and Inpatient level of care appropriate due to severity of illness   Dispo: The patient is from: Home              Anticipated d/c is to: Home              Anticipated d/c date is: 01/24/20              Patient currently is not medically stable to d/c.   Objective: Blood pressure (!) 134/98, pulse (!) 103, temperature 98.1 F (36.7 C), temperature source Oral, resp. rate 10, height 5' 10.75" (1.797 m), weight 104.3 kg, SpO2 95 %.  Examination: General appearance: Adult man resting in bed in no distress and appears much more comfortable than yesterday. Head: Normocephalic, without obvious abnormality, atraumatic Eyes: EOMI Lungs: clear to auscultation bilaterally Heart: regular rate and rhythm and  S1, S2 normal Abdomen: Vertical scar appreciated from epigastrium to umbilicus with indentation in the midline and other scattered scars. Soft, nondistended, bowel sounds present. Tenderness to palpation throughout which is much improved with no rebound or guarding.  Back: Bilateral CVA tenderness now minimal compared to previous exam Extremities: No edema Skin: mobility and turgor normal Neurologic: Grossly normal   Consultants:   ID  Procedures:   None  Data Reviewed: I have personally reviewed following labs and imaging studies Results for orders placed or performed during the hospital encounter of 01/17/20 (from the past 24 hour(s))  Basic metabolic panel     Status: Abnormal   Collection Time: 01/21/20  2:17 AM  Result Value Ref Range   Sodium 139 135 - 145 mmol/L   Potassium 3.8 3.5 - 5.1 mmol/L   Chloride 107 98 - 111 mmol/L   CO2 22 22 - 32 mmol/L   Glucose, Bld 142 (H) 70 - 99 mg/dL   BUN 14 6 - 20 mg/dL   Creatinine, Ser 1.42 (H) 0.61 - 1.24 mg/dL   Calcium 9.9 8.9 - 10.3 mg/dL   GFR calc non Af Amer 55 (L) >60 mL/min   GFR calc Af Amer >60 >60 mL/min   Anion gap 10 5 - 15  CBC with Differential/Platelet     Status: Abnormal   Collection Time: 01/21/20  2:17  AM  Result Value Ref Range   WBC 6.4 4.0 - 10.5 K/uL   RBC 3.69 (L) 4.22 - 5.81 MIL/uL   Hemoglobin 11.4 (L) 13.0 - 17.0 g/dL   HCT 36.9 (L) 39 - 52 %   MCV 100.0 80.0 - 100.0 fL   MCH 30.9 26.0 - 34.0 pg   MCHC 30.9 30.0 - 36.0 g/dL   RDW 14.6 11.5 - 15.5 %   Platelets 141 (L) 150 - 400 K/uL   nRBC 0.0 0.0 - 0.2 %   Neutrophils Relative % 71 %   Neutro Abs 4.5 1.7 - 7.7 K/uL   Lymphocytes Relative 19 %   Lymphs Abs 1.2 0.7 - 4.0 K/uL   Monocytes Relative 7 %   Monocytes Absolute 0.5 0 - 1 K/uL   Eosinophils Relative 2 %   Eosinophils Absolute 0.1 0 - 0 K/uL   Basophils Relative 0 %   Basophils Absolute 0.0 0 - 0 K/uL   Immature Granulocytes 1 %   Abs Immature Granulocytes 0.05 0.00 - 0.07 K/uL    Magnesium     Status: None   Collection Time: 01/21/20  2:17 AM  Result Value Ref Range   Magnesium 2.1 1.7 - 2.4 mg/dL  Phosphorus     Status: None   Collection Time: 01/21/20  2:17 AM  Result Value Ref Range   Phosphorus 2.9 2.5 - 4.6 mg/dL  Procalcitonin     Status: None   Collection Time: 01/21/20  2:17 AM  Result Value Ref Range   Procalcitonin 4.25 ng/mL    Recent Results (from the past 240 hour(s))  C Difficile Quick Screen w PCR reflex     Status: None   Collection Time: 01/17/20 12:54 PM   Specimen: STOOL  Result Value Ref Range Status   C Diff antigen NEGATIVE NEGATIVE Final   C Diff toxin NEGATIVE NEGATIVE Final   C Diff interpretation No C. difficile detected.  Final    Comment: Performed at [facility], [place] [address] [place], [place] [address]  Blood Culture (routine x 2)     Status: Abnormal   Collection Time: 01/17/20  3:48 PM   Specimen: BLOOD RIGHT HAND  Result Value Ref Range Status   Specimen Description   Final    BLOOD RIGHT HAND Performed at [facility] [address], [place], [place] [address]    Special Requests   Final    BOTTLES DRAWN AEROBIC AND ANAEROBIC Blood Culture adequate volume Performed at [facility] [address] [place], [place] [address]    Culture  Setup Time   Final    GRAM NEGATIVE RODS ANAEROBIC BOTTLE ONLY Organism ID to follow CRITICAL RESULT CALLED TO, READ BACK BY AND VERIFIED WITH: D. Wofford PharmD 9:15 01/18/20 ([email or web address])    Culture (A)  Final    ESCHERICHIA COLI SUSCEPTIBILITIES PERFORMED ON PREVIOUS CULTURE WITHIN THE LAST 5 DAYS. Performed at [facility], [place] [address] [place], [place] [address]    Report Status 01/20/2020 FINAL  Final  Blood Culture (routine x 2)     Status: Abnormal   Collection Time: 01/17/20  3:48 PM   Specimen: BLOOD LEFT HAND  Result Value Ref Range Status   Specimen Description   Final    BLOOD LEFT HAND Performed  at [facility] [address], [place], [place] [address]    Special Requests   Final    BOTTLES DRAWN AEROBIC  AND ANAEROBIC Blood Culture adequate volume Performed at [facility] [address], [place], [place] [address]    Culture  Setup Time   Final    GRAM NEGATIVE RODS IN BOTH AEROBIC AND ANAEROBIC BOTTLES CRITICAL VALUE NOTED.  VALUE IS CONSISTENT WITH PREVIOUSLY REPORTED AND CALLED VALUE. Performed at [facility], [place] [address] [place], [place] [address]    Culture (A)  Final    ESCHERICHIA COLI Confirmed Extended Spectrum Beta-Lactamase Producer (ESBL).  In bloodstream infections from ESBL organisms, carbapenems are preferred over piperacillin/tazobactam. They are shown to have a lower risk of mortality.    Report Status 01/20/2020 FINAL  Final   Organism ID, Bacteria ESCHERICHIA COLI  Final      Susceptibility   Escherichia coli - MIC*    AMPICILLIN >=32 RESISTANT Resistant     CEFAZOLIN >=64 RESISTANT Resistant     CEFEPIME >=32 RESISTANT Resistant     CEFTAZIDIME >=64 RESISTANT Resistant     CEFTRIAXONE >=64 RESISTANT Resistant     CIPROFLOXACIN >=4 RESISTANT Resistant     GENTAMICIN <=1 SENSITIVE Sensitive     IMIPENEM <=0.25 SENSITIVE Sensitive     TRIMETH/SULFA >=320 RESISTANT Resistant     AMPICILLIN/SULBACTAM >=32 RESISTANT Resistant     PIP/TAZO 8 SENSITIVE Sensitive     * ESCHERICHIA COLI  Urine culture     Status: Abnormal   Collection Time: 01/17/20  3:48 PM   Specimen: In/Out Cath Urine  Result Value Ref Range Status   Specimen Description   Final    IN/OUT CATH URINE Performed at [facility] [address] [place], [place] [address]    Special Requests   Final    NONE Performed at [facility], [place] [address] [place], [place] [address]    Culture (A)  Final    >=100,000 COLONIES/mL ESCHERICHIA COLI Confirmed Extended Spectrum Beta-Lactamase Producer (ESBL).   In bloodstream infections from ESBL organisms, carbapenems are preferred over piperacillin/tazobactam. They are shown to have a lower risk of mortality.    Report Status 01/19/2020 FINAL  Final   Organism ID, Bacteria ESCHERICHIA COLI (A)  Final      Susceptibility   Escherichia coli - MIC*    AMPICILLIN >=32 RESISTANT Resistant     CEFAZOLIN >=64 RESISTANT Resistant     CEFTRIAXONE >=64 RESISTANT Resistant     CIPROFLOXACIN >=4 RESISTANT Resistant     GENTAMICIN <=1 SENSITIVE Sensitive     IMIPENEM <=0.25 SENSITIVE Sensitive     NITROFURANTOIN 32 SENSITIVE Sensitive     TRIMETH/SULFA >=320 RESISTANT Resistant     AMPICILLIN/SULBACTAM >=32 RESISTANT Resistant     PIP/TAZO 8 SENSITIVE Sensitive     * >=100,000 COLONIES/mL ESCHERICHIA COLI  Blood Culture ID Panel (Reflexed)     Status: Abnormal   Collection Time: 01/17/20  3:48 PM  Result Value Ref Range Status   Enterococcus faecalis NOT DETECTED NOT DETECTED Final   Enterococcus Faecium NOT DETECTED NOT DETECTED Final   Listeria monocytogenes NOT DETECTED NOT DETECTED Final   Staphylococcus species NOT DETECTED NOT DETECTED Final   Staphylococcus aureus (BCID) NOT DETECTED NOT DETECTED Final   Staphylococcus epidermidis NOT DETECTED NOT DETECTED Final   Staphylococcus lugdunensis NOT DETECTED NOT DETECTED Final   Streptococcus species NOT DETECTED NOT DETECTED Final   Streptococcus agalactiae NOT DETECTED NOT DETECTED Final   Streptococcus pneumoniae NOT DETECTED NOT DETECTED Final   Streptococcus pyogenes NOT DETECTED NOT DETECTED Final  A.calcoaceticus-baumannii NOT DETECTED NOT DETECTED Final   Bacteroides fragilis NOT DETECTED NOT DETECTED Final   Enterobacterales DETECTED (A) NOT DETECTED Final    Comment: Enterobacterales represent a large order of gram negative bacteria, not a single organism.   Enterobacter cloacae complex NOT DETECTED NOT DETECTED Final   Escherichia coli DETECTED (A) NOT DETECTED Final    Comment:  CRITICAL RESULT CALLED TO, READ BACK BY AND VERIFIED WITH: D. Wofford PharmD 9:15 01/18/20 ([email or web address])    Klebsiella aerogenes NOT DETECTED NOT DETECTED Final   Klebsiella oxytoca NOT DETECTED NOT DETECTED Final   Klebsiella pneumoniae NOT DETECTED NOT DETECTED Final   Proteus species NOT DETECTED NOT DETECTED Final   Salmonella species NOT DETECTED NOT DETECTED Final   Serratia marcescens NOT DETECTED NOT DETECTED Final   Haemophilus influenzae NOT DETECTED NOT DETECTED Final   Neisseria meningitidis NOT DETECTED NOT DETECTED Final   Pseudomonas aeruginosa NOT DETECTED NOT DETECTED Final   Stenotrophomonas maltophilia NOT DETECTED NOT DETECTED Final   Candida albicans NOT DETECTED NOT DETECTED Final   Candida auris NOT DETECTED NOT DETECTED Final   Candida glabrata NOT DETECTED NOT DETECTED Final   Candida krusei NOT DETECTED NOT DETECTED Final   Candida parapsilosis NOT DETECTED NOT DETECTED Final   Candida tropicalis NOT DETECTED NOT DETECTED Final   Cryptococcus neoformans/gattii NOT DETECTED NOT DETECTED Final   CTX-M ESBL DETECTED (A) NOT DETECTED Final    Comment: CRITICAL RESULT CALLED TO, READ BACK BY AND VERIFIED WITH: D. Wofford PharmD 9:15 01/18/20 ([email or web address]) (NOTE) Extended spectrum beta-lactamase detected. Recommend a carbapenem as initial therapy.      Carbapenem resistance IMP NOT DETECTED NOT DETECTED Final   Carbapenem resistance KPC NOT DETECTED NOT DETECTED Final   Carbapenem resistance NDM NOT DETECTED NOT DETECTED Final   Carbapenem resist OXA 48 LIKE NOT DETECTED NOT DETECTED Final   Carbapenem resistance VIM NOT DETECTED NOT DETECTED Final    Comment: Performed at [facility], [place] [address] [place],  [address]  SARS Coronavirus 2 by RT PCR (hospital order, performed in [facility] hospital lab) Nasopharyngeal Nasopharyngeal Swab     Status: None   Collection Time: 01/17/20  6:40 PM   Specimen: Nasopharyngeal Swab  Result Value Ref Range Status    SARS Coronavirus 2 NEGATIVE NEGATIVE Final    Comment: (NOTE) SARS-CoV-2 target nucleic acids are NOT DETECTED.  The SARS-CoV-2 RNA is generally detectable in upper and lower respiratory specimens during the acute phase of infection. The lowest concentration of SARS-CoV-2 viral copies this assay can detect is 250 copies / mL. A negative result does not preclude SARS-CoV-2 infection and should not be used as the sole basis for treatment or other patient management decisions.  A negative result may occur with improper specimen collection / handling, submission of specimen other than nasopharyngeal swab, presence of viral mutation(s) within the areas targeted by this assay, and inadequate number of viral copies (<250 copies / mL). A negative result must be combined with clinical observations, patient history, and epidemiological information.  Fact Sheet for Patients:   StrictlyIdeas.no  Fact Sheet for Healthcare Providers: BankingDealers.co.za  This test is not yet approved or  cleared by the [place] FDA and has been authorized for detection and/or diagnosis of SARS-CoV-2 by FDA under an Emergency Use Authorization (EUA).  This EUA will remain in effect (meaning this test can be used) for the duration of the COVID-19 declaration under Section 564(b)(1) of the Act, 21 U.S.C. section  360bbb-3(b)(1), unless the authorization is terminated or revoked sooner.  Performed at [facility], [place] [address], [place], [place] [address]   MRSA PCR Screening     Status: Abnormal   Collection Time: 01/19/20  7:46 AM   Specimen: Nasal Mucosa; Nasopharyngeal  Result Value Ref Range Status   MRSA by PCR POSITIVE (A) NEGATIVE Final    Comment:        The GeneXpert MRSA Assay (FDA approved for NASAL specimens only), is one component of a comprehensive MRSA colonization surveillance program. It is not intended to diagnose  MRSA infection nor to guide or monitor treatment for MRSA infections. RESULT CALLED TO, READ BACK BY AND VERIFIED WITH: H.SEGERS AT 1341 ON 01/19/20 BY N.THOMPSON Performed at [facility], [place] [address] [place], [place] [address]   Culture, blood (routine x 2)     Status: None (Preliminary result)   Collection Time: 01/19/20 10:19 AM   Specimen: BLOOD RIGHT HAND  Result Value Ref Range Status   Specimen Description   Final    BLOOD RIGHT HAND Performed at [facility] [address] [place], [place] [address]    Special Requests   Final    BOTTLES DRAWN AEROBIC ONLY Blood Culture results may not be optimal due to an inadequate volume of blood received in culture bottles Performed at [facility] [address], [place], [place] [address]    Culture   Final    NO GROWTH 1 DAY Performed at [facility], [place] [address], [place], [place] [address]    Report Status PENDING  Incomplete  Culture, blood (routine x 2)     Status: None (Preliminary result)   Collection Time: 01/19/20 10:19 AM   Specimen: BLOOD LEFT HAND  Result Value Ref Range Status   Specimen Description   Final    BLOOD LEFT HAND Performed at [facility] [address] [place], [place] [address]    Special Requests   Final    BOTTLES DRAWN AEROBIC AND ANAEROBIC Blood Culture adequate volume Performed at [facility] [address], [place],  [address]    Culture   Final    NO GROWTH 1 DAY Performed at [facility], [place] [address] [place],  [address]    Report Status PENDING  Incomplete     Radiology Studies: No results found. CT ABDOMEN PELVIS WO CONTRAST  Final Result    DG Chest 2 View  Final Result       Scheduled Meds: . ascorbic acid  500 mg Oral Daily  . Chlorhexidine Gluconate Cloth  6 each Topical Daily  . Chlorhexidine Gluconate Cloth  6 each Topical Q0600  .  enoxaparin (LOVENOX) injection  40 mg Subcutaneous Q24H  . gabapentin  300 mg Oral BID  . loperamide  2 mg Oral BID  . mouth rinse  15 mL Mouth Rinse BID  . multivitamin with minerals  1 tablet Oral Daily  . mupirocin ointment  1 application Nasal BID  . omega-3 acid ethyl esters  1 g Oral BID  . pantoprazole  40 mg Oral Daily  . vitamin B-12  100 mcg Oral Daily  . Vitamin D (Ergocalciferol)  50,000 Units Oral Q7 days   PRN Meds: acetaminophen, dicyclomine, hydrALAZINE, HYDROmorphone (DILAUDID) injection, labetalol, melatonin, metoprolol tartrate, ondansetron **OR** ondansetron (ZOFRAN) IV, psyllium, senna-docusate, traMADol Continuous Infusions: . meropenem (MERREM) IV Stopped (01/21/20 0242)  LOS: 4 days  Time spent: Greater than 50% of the 35 minute visit was spent in counseling/coordination of care for the patient as laid out in the A&P.   Dwyane Dee, MD [facility] 01/21/2020, 7:58 AM   Contact via secure chat.  To contact the attending provider between 7A-7P or the covering provider during after hours 7P-7A, please log into the web site [email or web address] and access using universal [place] password for that web site. If you do not have the password, please call the hospital operator.

## 2020-01-22 LAB — CBC WITH DIFFERENTIAL/PLATELET
Abs Immature Granulocytes: 0.02 10*3/uL (ref 0.00–0.07)
Basophils Absolute: 0 10*3/uL (ref 0.0–0.1)
Basophils Relative: 0 %
Eosinophils Absolute: 0.1 10*3/uL (ref 0.0–0.5)
Eosinophils Relative: 2 %
HCT: 32.9 % — ABNORMAL LOW (ref 39.0–52.0)
Hemoglobin: 10.3 g/dL — ABNORMAL LOW (ref 13.0–17.0)
Immature Granulocytes: 0 %
Lymphocytes Relative: 24 %
Lymphs Abs: 1.4 10*3/uL (ref 0.7–4.0)
MCH: 30.7 pg (ref 26.0–34.0)
MCHC: 31.3 g/dL (ref 30.0–36.0)
MCV: 98.2 fL (ref 80.0–100.0)
Monocytes Absolute: 0.5 10*3/uL (ref 0.1–1.0)
Monocytes Relative: 9 %
Neutro Abs: 3.7 10*3/uL (ref 1.7–7.7)
Neutrophils Relative %: 65 %
Platelets: 150 10*3/uL (ref 150–400)
RBC: 3.35 MIL/uL — ABNORMAL LOW (ref 4.22–5.81)
RDW: 14.6 % (ref 11.5–15.5)
WBC: 5.7 10*3/uL (ref 4.0–10.5)
nRBC: 0 % (ref 0.0–0.2)

## 2020-01-22 LAB — BASIC METABOLIC PANEL
Anion gap: 9 (ref 5–15)
BUN: 16 mg/dL (ref 6–20)
CO2: 24 mmol/L (ref 22–32)
Calcium: 9.6 mg/dL (ref 8.9–10.3)
Chloride: 106 mmol/L (ref 98–111)
Creatinine, Ser: 1.33 mg/dL — ABNORMAL HIGH (ref 0.61–1.24)
GFR calc Af Amer: >60 mL/min (ref >60)
GFR calc non Af Amer: 59 mL/min — ABNORMAL LOW (ref >60)
Glucose, Bld: 221 mg/dL — ABNORMAL HIGH (ref 70–99)
Potassium: 3.7 mmol/L (ref 3.5–5.1)
Sodium: 139 mmol/L (ref 135–145)

## 2020-01-22 LAB — PHOSPHORUS: Phosphorus: 3.2 mg/dL (ref 2.5–4.6)

## 2020-01-22 LAB — MAGNESIUM: Magnesium: 2 mg/dL (ref 1.7–2.4)

## 2020-01-22 MED ORDER — LOPERAMIDE HCL 2 MG PO CAPS
2.0000 mg | ORAL_CAPSULE | Freq: Four times a day (QID) | ORAL | Status: DC | PRN
  Administered 2020-01-22 – 2020-01-24 (×7): 2 mg via ORAL
  Filled 2020-01-22 (×7): qty 1

## 2020-01-22 MED ORDER — HYDROMORPHONE HCL 1 MG/ML IJ SOLN
1.5000 mg | INTRAMUSCULAR | Status: DC | PRN
  Administered 2020-01-22 – 2020-01-24 (×7): 1.5 mg via INTRAVENOUS
  Filled 2020-01-22 (×7): qty 1.5

## 2020-01-22 MED ORDER — OXYCODONE HCL 5 MG PO TABS
5.0000 mg | ORAL_TABLET | ORAL | Status: DC | PRN
  Administered 2020-01-22 – 2020-01-24 (×5): 5 mg via ORAL
  Filled 2020-01-22 (×5): qty 1

## 2020-01-22 MED ORDER — ACETAMINOPHEN 500 MG PO TABS
1000.0000 mg | ORAL_TABLET | Freq: Four times a day (QID) | ORAL | Status: DC
  Administered 2020-01-22 – 2020-01-24 (×7): 1000 mg via ORAL
  Filled 2020-01-22 (×7): qty 2

## 2020-01-22 MED ORDER — ZOLPIDEM TARTRATE 5 MG PO TABS
5.0000 mg | ORAL_TABLET | Freq: Once | ORAL | Status: AC
  Administered 2020-01-23: 5 mg via ORAL
  Filled 2020-01-22: qty 1

## 2020-01-22 NOTE — Plan of Care (Signed)
  Problem: Education: Goal: Knowledge of General Education information will improve Description: Including pain rating scale, medication(s)/side effects and non-pharmacologic comfort measures 01/22/2020 0003 by Josepha Pigg, RN Outcome: Progressing 01/22/2020 0003 by Josepha Pigg, RN Outcome: Progressing   Problem: Health Behavior/Discharge Planning: Goal: Ability to manage health-related needs will improve 01/22/2020 0003 by Josepha Pigg, RN Outcome: Progressing 01/22/2020 0003 by Josepha Pigg, RN Outcome: Progressing   Problem: Clinical Measurements: Goal: Ability to maintain clinical measurements within normal limits will improve 01/22/2020 0003 by Josepha Pigg, RN Outcome: Progressing 01/22/2020 0003 by Josepha Pigg, RN Outcome: Progressing Goal: Will remain free from infection 01/22/2020 0003 by Josepha Pigg, RN Outcome: Progressing 01/22/2020 0003 by Josepha Pigg, RN Outcome: Progressing Goal: Diagnostic test results will improve 01/22/2020 0003 by Josepha Pigg, RN Outcome: Progressing 01/22/2020 0003 by Josepha Pigg, RN Outcome: Progressing Goal: Respiratory complications will improve 01/22/2020 0003 by Josepha Pigg, RN Outcome: Progressing 01/22/2020 0003 by Josepha Pigg, RN Outcome: Progressing Goal: Cardiovascular complication will be avoided 01/22/2020 0003 by Josepha Pigg, RN Outcome: Progressing 01/22/2020 0003 by Josepha Pigg, RN Outcome: Progressing   Problem: Activity: Goal: Risk for activity intolerance will decrease 01/22/2020 0003 by Josepha Pigg, RN Outcome: Progressing 01/22/2020 0003 by Josepha Pigg, RN Outcome: Progressing   Problem: Nutrition: Goal: Adequate nutrition will be maintained 01/22/2020 0003 by Josepha Pigg, RN Outcome: Progressing 01/22/2020 0003 by Josepha Pigg, RN Outcome: Progressing   Problem: Coping: Goal: Level of anxiety will decrease 01/22/2020  0003 by Josepha Pigg, RN Outcome: Progressing 01/22/2020 0003 by Josepha Pigg, RN Outcome: Progressing   Problem: Elimination: Goal: Will not experience complications related to bowel motility 01/22/2020 0003 by Josepha Pigg, RN Outcome: Progressing 01/22/2020 0003 by Josepha Pigg, RN Outcome: Progressing Goal: Will not experience complications related to urinary retention 01/22/2020 0003 by Josepha Pigg, RN Outcome: Progressing 01/22/2020 0003 by Josepha Pigg, RN Outcome: Progressing   Problem: Pain Managment: Goal: General experience of comfort will improve 01/22/2020 0003 by Josepha Pigg, RN Outcome: Progressing 01/22/2020 0003 by Josepha Pigg, RN Outcome: Progressing   Problem: Safety: Goal: Ability to remain free from injury will improve 01/22/2020 0003 by Josepha Pigg, RN Outcome: Progressing 01/22/2020 0003 by Josepha Pigg, RN Outcome: Progressing   Problem: Skin Integrity: Goal: Risk for impaired skin integrity will decrease 01/22/2020 0003 by Josepha Pigg, RN Outcome: Progressing 01/22/2020 0003 by Josepha Pigg, RN Outcome: Progressing

## 2020-01-22 NOTE — Progress Notes (Signed)
PROGRESS NOTE    BANNER HUCKABA   [id number]  DOB: Oct 22, 1960  DOA: 01/17/2020     5  PCP: Charline Bills, MD  CC: Abdominal pain, fevers, chills, back pain  Hospital Course: Matthew Francis is a 59 y.o. male with history of ulcerative colitis with total colectomy in 8984 complicated by pouchitis for which patient was recently started on prednisone by patient's gastroenterologist in [facility].  Patient is also on Cipro for anti-inflammatory properties for the same reason.   He began developing fevers, chills, low back pain, and worsening abdominal pain which prompted him to come to the ER for further evaluation. He denied any cough, shortness of breath, vomiting, nor chest pain. In the ER he was found to be mildly febrile, 100.3 with elevated lactic acid, 3. He also had leukocytosis, 20.7 and elevated creatinine, 1.58. Urinalysis was concerning for UTI and as he appeared septic, he was started on vancomycin, cefepime, Flagyl. Blood cultures were also collected. COVID-19 test was also performed and was negative. Soon after admission, blood cultures returned positive for 3/4 bottles with ESBL E. coli. His antibiotics were modified to monotherapy meropenem. Repeat blood cultures were drawn on 01/19/2020.  Infectious disease was also consulted for further recommendations regarding length of course of antibiotics.  He was recommended to complete a 7-day course total of meropenem, for end date of 01/24/2020.  He also converted into A. fib with RVR during hospitalization.  This was considered provoked in setting of severe sepsis infection.  He had no prior history of A. fib.  He was started on a Cardizem infusion and spontaneously converted back to NSR on 01/20/2020.  Cardizem infusion was tapered after conversion and he remained in NSR and was able to be transferred out of SDU to tele.    Interval History:  No events overnight.  Pain control still seems to be the biggest obstacle.  We had attempted  discussing reducing dose of Dilaudid which he was resistant to however was okay with starting on oral oxycodone and trying to limit frequency of use of Dilaudid.  He understands tentative plan is for discharging home which he cannot do if remaining on Dilaudid.  He states he plans to try and continuing tapering down.  Old records reviewed in assessment of this patient  ROS: Constitutional: positive for chills, fevers and malaise, Respiratory: negative for cough, Cardiovascular: negative for chest pain and Gastrointestinal: positive for abdominal pain  Assessment & Plan: Severe sepsis ([place]) - met SIRS on admission with source of infection at this time felt to be urinary and has now translocated to blood (E. Coli ESBL). HR 133, RR 36, WBC 20.7, Lactic 3 initially -Hydrocortisone initially started on admission has been tapered off.  BP remained stable - s/p vanc, cefepime, [name]. This was changed to meropenem as blood cultures resulted with ESBL E. coli -Lactic normalized with trending -IVF off, he is eating well -Continue pain control.  Pain has been difficult to control with modification to regimen as needed -Cultures repeated on 01/19/2020.  Continue following. Remain NGTD -Tentative stop date for meropenem will be 01/24/2020 (7-day course) as per ID recommendation.  Acute lower UTI -See severe sepsis  AKI (acute kidney injury) ([place]) -Renal function improved with IVF; now maintaining diet -Repeat BMP in a.m.  Thrombocytopenia ([place]) -Considered due to sepsis; platelets have been recovering with infection being treated  Essential hypertension -Use labetalol or hydralazine as needed. 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 Patient on chronic Cipro for anti-inflammatory properties and intermittent prednisone  E coli bacteremia -See severe sepsis -Admission cultures  growing 3/4 bottles with ESBL E. coli -Repeat blood cultures on 01/19/2020 and follow for clearance -Appreciate ID consult.  7-day course of meropenem recommended.  End date 01/24/2020  Atrial fibrillation with RVR ([place]) -Patient converted into A. fib on night of 01/18/2020.  No prior history of A. fib and he has had a cardiac work-up in the past for other reasons with no arrhythmia noted at that time -Etiology is considered in setting of severe sepsis infection with bacteremia.  No further work-up at this time unless reoccurs outside setting of severe infection.  Discussed with patient as well -Continue Cardizem and will titrate off as able; converted to NSR on 01/20/2020 - remains in NSR, occasional sinus tach not unexpected in setting of resolving infection and some deconditioning - transfer to tele  UTI due to extended-spectrum beta lactamase (ESBL) producing Escherichia coli - see severe sepsis   Antimicrobials: Vancomycin, cefepime, Flagyl 01/17/2020 -01/18/2020 Meropenem 01/18/2020>> present.  Tentative stop date 01/24/2020  DVT prophylaxis: Lovenox Code Status: Full Family Communication: None present Disposition Plan:  Status is: Inpatient  Remains inpatient appropriate because:Ongoing active pain requiring inpatient pain management, Unsafe d/c plan, IV treatments appropriate due to intensity of illness or inability to take PO and Inpatient level of care appropriate due to severity of illness   Dispo: The patient is from: Home              Anticipated d/c is to: Home              Anticipated d/c date is: 01/24/20              Patient currently is not medically stable to d/c.   Objective: Blood pressure 137/90, pulse 93, temperature 99 F (37.2 C), temperature source Oral, resp. rate 17, height 5' 10.5" (1.791 m), weight 108.3 kg, SpO2 99 %.  Examination: General appearance: Adult man resting in bed in no distress and appears much more comfortable than yesterday. Head: Normocephalic,  without obvious abnormality, atraumatic Eyes: EOMI Lungs: clear to auscultation bilaterally Heart: regular rate and rhythm and S1, S2 normal Abdomen: Vertical scar appreciated from epigastrium to umbilicus with indentation in the midline and other scattered scars. Soft, nondistended, bowel sounds present. Tenderness to palpation throughout which is much improved with no rebound or guarding.  Back: Bilateral CVA tenderness now minimal compared to previous exam Extremities: No edema Skin: mobility and turgor normal Neurologic: Grossly normal   Consultants:   ID  Procedures:   None  Data Reviewed: I have personally reviewed following labs and imaging studies Results for orders placed or performed during the hospital encounter of 01/17/20 (from the past 24 hour(s))  Basic metabolic panel     Status: Abnormal   Collection Time: 01/22/20  5:28 AM  Result Value Ref Range   Sodium 139 135 - 145 mmol/L   Potassium 3.7 3.5 - 5.1 mmol/L   Chloride 106 98 - 111 mmol/L   CO2 24 22 - 32 mmol/L   Glucose, Bld 221 (H) 70 - 99 mg/dL   BUN 16 6 - 20 mg/dL   Creatinine, Ser 1.33 (H) 0.61 - 1.24 mg/dL   Calcium 9.6 8.9 - 10.3 mg/dL   GFR calc non Af Amer 59 (L) >60 mL/min   GFR calc Af Amer >60 >60 mL/min  Anion gap 9 5 - 15  CBC with Differential/Platelet     Status: Abnormal   Collection Time: 01/22/20  5:28 AM  Result Value Ref Range   WBC 5.7 4.0 - 10.5 K/uL   RBC 3.35 (L) 4.22 - 5.81 MIL/uL   Hemoglobin 10.3 (L) 13.0 - 17.0 g/dL   HCT 32.9 (L) 39 - 52 %   MCV 98.2 80.0 - 100.0 fL   MCH 30.7 26.0 - 34.0 pg   MCHC 31.3 30.0 - 36.0 g/dL   RDW 14.6 11.5 - 15.5 %   Platelets 150 150 - 400 K/uL   nRBC 0.0 0.0 - 0.2 %   Neutrophils Relative % 65 %   Neutro Abs 3.7 1.7 - 7.7 K/uL   Lymphocytes Relative 24 %   Lymphs Abs 1.4 0.7 - 4.0 K/uL   Monocytes Relative 9 %   Monocytes Absolute 0.5 0 - 1 K/uL   Eosinophils Relative 2 %   Eosinophils Absolute 0.1 0 - 0 K/uL   Basophils Relative  0 %   Basophils Absolute 0.0 0 - 0 K/uL   Immature Granulocytes 0 %   Abs Immature Granulocytes 0.02 0.00 - 0.07 K/uL  Magnesium     Status: None   Collection Time: 01/22/20  5:28 AM  Result Value Ref Range   Magnesium 2.0 1.7 - 2.4 mg/dL  Phosphorus     Status: None   Collection Time: 01/22/20  5:28 AM  Result Value Ref Range   Phosphorus 3.2 2.5 - 4.6 mg/dL    Recent Results (from the past 240 hour(s))  C Difficile Quick Screen w PCR reflex     Status: None   Collection Time: 01/17/20 12:54 PM   Specimen: STOOL  Result Value Ref Range Status   C Diff antigen NEGATIVE NEGATIVE Final   C Diff toxin NEGATIVE NEGATIVE Final   C Diff interpretation No C. difficile detected.  Final    Comment: Performed at [facility], [place] [address] [place], [place] [address]  Blood Culture (routine x 2)     Status: Abnormal   Collection Time: 01/17/20  3:48 PM   Specimen: BLOOD RIGHT HAND  Result Value Ref Range Status   Specimen Description   Final    BLOOD RIGHT HAND Performed at [facility] [address], [place], [place] [address]    Special Requests   Final    BOTTLES DRAWN AEROBIC AND ANAEROBIC Blood Culture adequate volume Performed at [facility] [address], [place], [place] [address]    Culture  Setup Time   Final    GRAM NEGATIVE RODS ANAEROBIC BOTTLE ONLY Organism ID to follow CRITICAL RESULT CALLED TO, READ BACK BY AND VERIFIED WITH: D. Wofford PharmD 9:15 01/18/20 ([email or web address])    Culture (A)  Final    ESCHERICHIA COLI SUSCEPTIBILITIES PERFORMED ON PREVIOUS CULTURE WITHIN THE LAST 5 DAYS. Performed at [facility], [place] [address], [place], [place] [address]    Report Status 01/20/2020 FINAL  Final  Blood Culture (routine x 2)     Status: Abnormal   Collection Time: 01/17/20  3:48 PM   Specimen: BLOOD LEFT HAND  Result Value Ref Range Status   Specimen Description   Final    BLOOD LEFT HAND Performed at  [facility] [address], [place], [place] [address]    Special Requests   Final    BOTTLES DRAWN AEROBIC AND ANAEROBIC Blood Culture adequate  volume Performed at [facility], [place] [address], [place], [place] [address]    Culture  Setup Time   Final    GRAM NEGATIVE RODS IN BOTH AEROBIC AND ANAEROBIC BOTTLES CRITICAL VALUE NOTED.  VALUE IS CONSISTENT WITH PREVIOUSLY REPORTED AND CALLED VALUE. Performed at [facility], [place] [address], [place], [place] [address]    Culture (A)  Final    ESCHERICHIA COLI Confirmed Extended Spectrum Beta-Lactamase Producer (ESBL).  In bloodstream infections from ESBL organisms, carbapenems are preferred over piperacillin/tazobactam. They are shown to have a lower risk of mortality.    Report Status 01/20/2020 FINAL  Final   Organism ID, Bacteria ESCHERICHIA COLI  Final      Susceptibility   Escherichia coli - MIC*    AMPICILLIN >=32 RESISTANT Resistant     CEFAZOLIN >=64 RESISTANT Resistant     CEFEPIME >=32 RESISTANT Resistant     CEFTAZIDIME >=64 RESISTANT Resistant     CEFTRIAXONE >=64 RESISTANT Resistant     CIPROFLOXACIN >=4 RESISTANT Resistant     GENTAMICIN <=1 SENSITIVE Sensitive     IMIPENEM <=0.25 SENSITIVE Sensitive     TRIMETH/SULFA >=320 RESISTANT Resistant     AMPICILLIN/SULBACTAM >=32 RESISTANT Resistant     PIP/TAZO 8 SENSITIVE Sensitive     * ESCHERICHIA COLI  Urine culture     Status: Abnormal   Collection Time: 01/17/20  3:48 PM   Specimen: In/Out Cath Urine  Result Value Ref Range Status   Specimen Description   Final    IN/OUT CATH URINE Performed at [facility] [address], [place], [place] [address]    Special Requests   Final    NONE Performed at [facility], [place] [address], [place], [place] [address]    Culture (A)  Final    >=100,000 COLONIES/mL ESCHERICHIA COLI Confirmed Extended Spectrum Beta-Lactamase Producer (ESBL).   In bloodstream infections from ESBL organisms, carbapenems are preferred over piperacillin/tazobactam. They are shown to have a lower risk of mortality.    Report Status 01/19/2020 FINAL  Final   Organism ID, Bacteria ESCHERICHIA COLI (A)  Final      Susceptibility   Escherichia coli - MIC*    AMPICILLIN >=32 RESISTANT Resistant     CEFAZOLIN >=64 RESISTANT Resistant     CEFTRIAXONE >=64 RESISTANT Resistant     CIPROFLOXACIN >=4 RESISTANT Resistant     GENTAMICIN <=1 SENSITIVE Sensitive     IMIPENEM <=0.25 SENSITIVE Sensitive     NITROFURANTOIN 32 SENSITIVE Sensitive     TRIMETH/SULFA >=320 RESISTANT Resistant     AMPICILLIN/SULBACTAM >=32 RESISTANT Resistant     PIP/TAZO 8 SENSITIVE Sensitive     * >=100,000 COLONIES/mL ESCHERICHIA COLI  Blood Culture ID Panel (Reflexed)     Status: Abnormal   Collection Time: 01/17/20  3:48 PM  Result Value Ref Range Status   Enterococcus faecalis NOT DETECTED NOT DETECTED Final   Enterococcus Faecium NOT DETECTED NOT DETECTED Final   Listeria monocytogenes NOT DETECTED NOT DETECTED Final   Staphylococcus species NOT DETECTED NOT DETECTED Final   Staphylococcus aureus (BCID) NOT DETECTED NOT DETECTED Final   Staphylococcus epidermidis NOT DETECTED NOT DETECTED Final   Staphylococcus lugdunensis NOT DETECTED NOT DETECTED Final   Streptococcus species NOT DETECTED NOT DETECTED Final   Streptococcus agalactiae NOT DETECTED NOT DETECTED Final   Streptococcus pneumoniae NOT DETECTED NOT DETECTED Final   Streptococcus pyogenes NOT DETECTED NOT DETECTED Final   A.calcoaceticus-baumannii NOT DETECTED NOT  DETECTED Final   Bacteroides fragilis NOT DETECTED NOT DETECTED Final   Enterobacterales DETECTED (A) NOT DETECTED Final    Comment: Enterobacterales represent a large order of gram negative bacteria, not a single organism.   Enterobacter cloacae complex NOT DETECTED NOT DETECTED Final   Escherichia coli DETECTED (A) NOT DETECTED Final    Comment:  CRITICAL RESULT CALLED TO, READ BACK BY AND VERIFIED WITH: D. Wofford PharmD 9:15 01/18/20 ([email or web address])    Klebsiella aerogenes NOT DETECTED NOT DETECTED Final   Klebsiella oxytoca NOT DETECTED NOT DETECTED Final   Klebsiella pneumoniae NOT DETECTED NOT DETECTED Final   Proteus species NOT DETECTED NOT DETECTED Final   Salmonella species NOT DETECTED NOT DETECTED Final   Serratia marcescens NOT DETECTED NOT DETECTED Final   Haemophilus influenzae NOT DETECTED NOT DETECTED Final   Neisseria meningitidis NOT DETECTED NOT DETECTED Final   Pseudomonas aeruginosa NOT DETECTED NOT DETECTED Final   Stenotrophomonas maltophilia NOT DETECTED NOT DETECTED Final   Candida albicans NOT DETECTED NOT DETECTED Final   Candida auris NOT DETECTED NOT DETECTED Final   Candida glabrata NOT DETECTED NOT DETECTED Final   Candida krusei NOT DETECTED NOT DETECTED Final   Candida parapsilosis NOT DETECTED NOT DETECTED Final   Candida tropicalis NOT DETECTED NOT DETECTED Final   Cryptococcus neoformans/gattii NOT DETECTED NOT DETECTED Final   CTX-M ESBL DETECTED (A) NOT DETECTED Final    Comment: CRITICAL RESULT CALLED TO, READ BACK BY AND VERIFIED WITH: D. Wofford PharmD 9:15 01/18/20 ([email or web address]) (NOTE) Extended spectrum beta-lactamase detected. Recommend a carbapenem as initial therapy.      Carbapenem resistance IMP NOT DETECTED NOT DETECTED Final   Carbapenem resistance KPC NOT DETECTED NOT DETECTED Final   Carbapenem resistance NDM NOT DETECTED NOT DETECTED Final   Carbapenem resist OXA 48 LIKE NOT DETECTED NOT DETECTED Final   Carbapenem resistance VIM NOT DETECTED NOT DETECTED Final    Comment: Performed at [facility], [place] [address], [place], [place] [address]  SARS Coronavirus 2 by RT PCR (hospital order, performed in [facility] hospital lab) Nasopharyngeal Nasopharyngeal Swab     Status: None   Collection Time: 01/17/20  6:40 PM   Specimen: Nasopharyngeal Swab  Result Value Ref Range Status    SARS Coronavirus 2 NEGATIVE NEGATIVE Final    Comment: (NOTE) SARS-CoV-2 target nucleic acids are NOT DETECTED.  The SARS-CoV-2 RNA is generally detectable in upper and lower respiratory specimens during the acute phase of infection. The lowest concentration of SARS-CoV-2 viral copies this assay can detect is 250 copies / mL. A negative result does not preclude SARS-CoV-2 infection and should not be used as the sole basis for treatment or other patient management decisions.  A negative result may occur with improper specimen collection / handling, submission of specimen other than nasopharyngeal swab, presence of viral mutation(s) within the areas targeted by this assay, and inadequate number of viral copies (<250 copies / mL). A negative result must be combined with clinical observations, patient history, and epidemiological information.  Fact Sheet for Patients:   StrictlyIdeas.no  Fact Sheet for Healthcare Providers: BankingDealers.co.za  This test is not yet approved or  cleared by the [place] FDA and has been authorized for detection and/or diagnosis of SARS-CoV-2 by FDA under an Emergency Use Authorization (EUA).  This EUA will remain in effect (meaning this test can be used) for the duration of the COVID-19 declaration under Section 564(b)(1) of the Act, 21 U.S.C. section 360bbb-3(b)(1), unless the authorization  is terminated or revoked sooner.  Performed at [facility], [place] [address], [place], [place] [address]   MRSA PCR Screening     Status: Abnormal   Collection Time: 01/19/20  7:46 AM   Specimen: Nasal Mucosa; Nasopharyngeal  Result Value Ref Range Status   MRSA by PCR POSITIVE (A) NEGATIVE Final    Comment:        The GeneXpert MRSA Assay (FDA approved for NASAL specimens only), is one component of a comprehensive MRSA colonization surveillance program. It is not intended to diagnose  MRSA infection nor to guide or monitor treatment for MRSA infections. RESULT CALLED TO, READ BACK BY AND VERIFIED WITH: H.SEGERS AT 1341 ON 01/19/20 BY N.THOMPSON Performed at [facility], [place] [address], [place], [place] [address]   Culture, blood (routine x 2)     Status: None (Preliminary result)   Collection Time: 01/19/20 10:19 AM   Specimen: BLOOD RIGHT HAND  Result Value Ref Range Status   Specimen Description   Final    BLOOD RIGHT HAND Performed at [facility] [address] [place], [place] [address]    Special Requests   Final    BOTTLES DRAWN AEROBIC ONLY Blood Culture results may not be optimal due to an inadequate volume of blood received in culture bottles Performed at [facility] [address], [place], [place] [address]    Culture   Final    NO GROWTH 3 DAYS Performed at [facility], [place] [address] [place], [place] [address]    Report Status PENDING  Incomplete  Culture, blood (routine x 2)     Status: None (Preliminary result)   Collection Time: 01/19/20 10:19 AM   Specimen: BLOOD LEFT HAND  Result Value Ref Range Status   Specimen Description   Final    BLOOD LEFT HAND Performed at [facility] [address] [place], [place] [address]    Special Requests   Final    BOTTLES DRAWN AEROBIC AND ANAEROBIC Blood Culture adequate volume Performed at [facility] [address], [place], [place] [address]    Culture   Final    NO GROWTH 3 DAYS Performed at [facility], [place] [address] [place], [place] [address]    Report Status PENDING  Incomplete     Radiology Studies: No results found. CT ABDOMEN PELVIS WO CONTRAST  Final Result    DG Chest 2 View  Final Result       Scheduled Meds: . acetaminophen  1,000 mg Oral Q6H  . ascorbic acid  500 mg Oral Daily  . Chlorhexidine Gluconate Cloth  6 each Topical Daily  . Chlorhexidine  Gluconate Cloth  6 each Topical Q0600  . enoxaparin (LOVENOX) injection  40 mg Subcutaneous Q24H  . gabapentin  300 mg Oral BID  . loperamide  2 mg Oral BID  . mouth rinse  15 mL Mouth Rinse BID  . multivitamin with minerals  1 tablet Oral Daily  . mupirocin ointment  1 application Nasal BID  . omega-3 acid ethyl esters  1 g Oral BID  . pantoprazole  40 mg Oral Daily  . vitamin B-12  100 mcg Oral Daily  . Vitamin D (Ergocalciferol)  50,000 Units Oral Q7 days   PRN Meds: dicyclomine, hydrALAZINE, HYDROmorphone (DILAUDID) injection, labetalol, melatonin, metoprolol tartrate, ondansetron **OR** ondansetron (ZOFRAN) IV, oxyCODONE, psyllium, senna-docusate Continuous Infusions: . meropenem (MERREM) IV 1 g (  01/22/20 1032)      LOS: 5 days  Time spent: Greater than 50% of the 35 minute visit was spent in counseling/coordination of care for the patient as laid out in the A&P.   Dwyane Dee, MD [facility] 01/22/2020, 1:27 PM   Contact via secure chat.  To contact the attending provider between 7A-7P or the covering provider during after hours 7P-7A, please log into the web site [email or web address] and access using universal [place] password for that web site. If you do not have the password, please call the hospital operator.

## 2020-01-22 NOTE — Progress Notes (Signed)
Patient complains of swelling in his abdomen due to not receiving prednisone since being admitted to the hospital. Patient states his dose of prednisone was just tapered down to 47m daily which he takes at lunch time. He states this medication is being given to him due to his ulcerative colitis.   RN notified XJeannette Corpus NP of the above information.

## 2020-01-23 DIAGNOSIS — K51919 Ulcerative colitis, unspecified with unspecified complications: Secondary | ICD-10-CM

## 2020-01-23 LAB — CBC WITH DIFFERENTIAL/PLATELET
Abs Immature Granulocytes: 0.04 10*3/uL (ref 0.00–0.07)
Basophils Absolute: 0 10*3/uL (ref 0.0–0.1)
Basophils Relative: 0 %
Eosinophils Absolute: 0.1 10*3/uL (ref 0.0–0.5)
Eosinophils Relative: 2 %
HCT: 34 % — ABNORMAL LOW (ref 39.0–52.0)
Hemoglobin: 10.6 g/dL — ABNORMAL LOW (ref 13.0–17.0)
Immature Granulocytes: 1 %
Lymphocytes Relative: 24 %
Lymphs Abs: 1.7 10*3/uL (ref 0.7–4.0)
MCH: 30.5 pg (ref 26.0–34.0)
MCHC: 31.2 g/dL (ref 30.0–36.0)
MCV: 98 fL (ref 80.0–100.0)
Monocytes Absolute: 0.6 10*3/uL (ref 0.1–1.0)
Monocytes Relative: 9 %
Neutro Abs: 4.5 10*3/uL (ref 1.7–7.7)
Neutrophils Relative %: 64 %
Platelets: 161 10*3/uL (ref 150–400)
RBC: 3.47 MIL/uL — ABNORMAL LOW (ref 4.22–5.81)
RDW: 14.5 % (ref 11.5–15.5)
WBC: 6.9 10*3/uL (ref 4.0–10.5)
nRBC: 0 % (ref 0.0–0.2)

## 2020-01-23 LAB — MAGNESIUM: Magnesium: 1.9 mg/dL (ref 1.7–2.4)

## 2020-01-23 LAB — BASIC METABOLIC PANEL
Anion gap: 8 (ref 5–15)
BUN: 15 mg/dL (ref 6–20)
CO2: 26 mmol/L (ref 22–32)
Calcium: 9.9 mg/dL (ref 8.9–10.3)
Chloride: 106 mmol/L (ref 98–111)
Creatinine, Ser: 1.34 mg/dL — ABNORMAL HIGH (ref 0.61–1.24)
GFR calc Af Amer: >60 mL/min (ref >60)
GFR calc non Af Amer: 59 mL/min — ABNORMAL LOW (ref >60)
Glucose, Bld: 122 mg/dL — ABNORMAL HIGH (ref 70–99)
Potassium: 4 mmol/L (ref 3.5–5.1)
Sodium: 140 mmol/L (ref 135–145)

## 2020-01-23 LAB — PHOSPHORUS: Phosphorus: 3 mg/dL (ref 2.5–4.6)

## 2020-01-23 LAB — SEDIMENTATION RATE: Sed Rate: 110 mm/hr — ABNORMAL HIGH (ref 0–16)

## 2020-01-23 MED ORDER — PREDNISONE 5 MG PO TABS
30.0000 mg | ORAL_TABLET | Freq: Every day | ORAL | Status: DC
  Administered 2020-01-23 – 2020-01-24 (×2): 30 mg via ORAL
  Filled 2020-01-23 (×2): qty 1

## 2020-01-23 NOTE — Progress Notes (Signed)
PROGRESS NOTE    Matthew Francis   [id number]  DOB: 09/17/1960  DOA: 01/17/2020     6  PCP: Charline Bills, MD  CC: Abdominal pain, fevers, chills, back pain  Hospital Course: Matthew Francis is a 59 y.o. male with history of ulcerative colitis with total colectomy in 7989 complicated by pouchitis for which patient was recently started on prednisone by patient's gastroenterologist in [facility].  Patient is also on Cipro for anti-inflammatory properties for the same reason.   He began developing fevers, chills, low back pain, and worsening abdominal pain which prompted him to come to the ER for further evaluation. He denied any cough, shortness of breath, vomiting, nor chest pain. In the ER he was found to be mildly febrile, 100.3 with elevated lactic acid, 3. He also had leukocytosis, 20.7 and elevated creatinine, 1.58. Urinalysis was concerning for UTI and as he appeared septic, he was started on vancomycin, cefepime, Flagyl. Blood cultures were also collected. COVID-19 test was also performed and was negative. Soon after admission, blood cultures returned positive for 3/4 bottles with ESBL E. coli. His antibiotics were modified to monotherapy meropenem. Repeat blood cultures were drawn on 01/19/2020.  Infectious disease was also consulted for further recommendations regarding length of course of antibiotics.  He was recommended to complete a 7-day course total of meropenem, for end date of 01/24/2020.  He also converted into A. fib with RVR during hospitalization.  This was considered provoked in setting of severe sepsis infection.  He had no prior history of A. fib.  He was started on a Cardizem infusion and spontaneously converted back to NSR on 01/20/2020.  Cardizem infusion was tapered after conversion and he remained in NSR and was able to be transferred out of SDU to tele.    Interval History:  No events overnight.  Patient states his UC pain is worse lately and he had some streaks of  blood in his stool overnight.  Otherwise he is resting comfortably in bed.   Old records reviewed in assessment of this patient  ROS: Constitutional: positive for chills, fevers and malaise, Respiratory: negative for cough, Cardiovascular: negative for chest pain and Gastrointestinal: positive for abdominal pain  Assessment & Plan: Severe sepsis ([place]) - met SIRS on admission with source of infection at this time felt to be urinary and has now translocated to blood (E. Coli ESBL). HR 133, RR 36, WBC 20.7, Lactic 3 initially -Hydrocortisone initially started on admission has been tapered off.  BP remained stable - s/p vanc, cefepime, [name]. This was changed to meropenem as blood cultures resulted with ESBL E. coli -Lactic normalized with trending -IVF off, he is eating well -Continue pain control.  Pain has been difficult to control with modification to regimen as needed -Cultures repeated on 01/19/2020.  Continue following. Remain NGTD -Tentative stop date for meropenem will be 01/24/2020 (7-day course) as per ID recommendation.  Acute lower UTI -See severe sepsis  AKI (acute kidney injury) ([place]) -Renal function improved with IVF; now maintaining diet -Repeat BMP in a.m.  Thrombocytopenia ([place]) -Considered due to sepsis; platelets have been recovering with infection being treated  Essential hypertension -Use labetalol or hydralazine as needed. 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 Patient on chronic Cipro for anti-inflammatory properties and intermittent prednisone - recently has been on long prednisone taper at home; on admission was on prednisone 30 mg  daily (see med rec) - resume prednisone 30 mg daily on 8/9; continue taper as previously doing at discharge   E coli bacteremia -See severe sepsis -Admission cultures growing 3/4 bottles with ESBL E. coli -Repeat  blood cultures on 01/19/2020 and follow for clearance -Appreciate ID consult.  7-day course of meropenem recommended.  End date 01/24/2020  Atrial fibrillation with RVR ([place]) -Patient converted into A. fib on night of 01/18/2020.  No prior history of A. fib and he has had a cardiac work-up in the past for other reasons with no arrhythmia noted at that time -Etiology is considered in setting of severe sepsis infection with bacteremia.  No further work-up at this time unless reoccurs outside setting of severe infection.  Discussed with patient as well -Continue Cardizem and will titrate off as able; converted to NSR on 01/20/2020 - remains in NSR, occasional sinus tach not unexpected in setting of resolving infection and some deconditioning - transfer to tele  UTI due to extended-spectrum beta lactamase (ESBL) producing Escherichia coli - see severe sepsis   Antimicrobials: Vancomycin, cefepime, Flagyl 01/17/2020 -01/18/2020 Meropenem 01/18/2020>> present.  Tentative stop date 01/24/2020  DVT prophylaxis: Lovenox Code Status: Full Family Communication: None present Disposition Plan:  Status is: Inpatient  Remains inpatient appropriate because:Ongoing active pain requiring inpatient pain management, Unsafe d/c plan, IV treatments appropriate due to intensity of illness or inability to take PO and Inpatient level of care appropriate due to severity of illness   Dispo: The patient is from: Home              Anticipated d/c is to: Home              Anticipated d/c date is: 01/24/20              Patient currently is not medically stable to d/c.   Objective: Blood pressure (!) 132/95, pulse 83, temperature 98.2 F (36.8 C), temperature source Oral, resp. rate 16, height 5' 10.5" (1.791 m), weight 108.3 kg, SpO2 99 %.  Examination: General appearance: Adult man resting in bed in no distress and appears much more comfortable than yesterday. Head: Normocephalic, without obvious abnormality,  atraumatic Eyes: EOMI Lungs: clear to auscultation bilaterally Heart: regular rate and rhythm and S1, S2 normal Abdomen: Vertical scar appreciated from epigastrium to umbilicus with indentation in the midline and other scattered scars. Soft, nondistended, bowel sounds present. Tenderness to palpation throughout which is much improved with no rebound or guarding.  Back: Bilateral CVA tenderness now minimal compared to previous exam Extremities: No edema Skin: mobility and turgor normal Neurologic: Grossly normal   Consultants:   ID  Procedures:   None  Data Reviewed: I have personally reviewed following labs and imaging studies Results for orders placed or performed during the hospital encounter of 01/17/20 (from the past 24 hour(s))  Basic metabolic panel     Status: Abnormal   Collection Time: 01/23/20  5:28 AM  Result Value Ref Range   Sodium 140 135 - 145 mmol/L   Potassium 4.0 3.5 - 5.1 mmol/L   Chloride 106 98 - 111 mmol/L   CO2 26 22 - 32 mmol/L   Glucose, Bld 122 (H) 70 - 99 mg/dL   BUN 15 6 - 20 mg/dL   Creatinine, Ser 1.34 (H) 0.61 - 1.24 mg/dL   Calcium 9.9 8.9 - 10.3 mg/dL   GFR calc non Af Amer 59 (L) >60 mL/min   GFR calc Af Amer >60 >60 mL/min  Anion gap 8 5 - 15  CBC with Differential/Platelet     Status: Abnormal   Collection Time: 01/23/20  5:28 AM  Result Value Ref Range   WBC 6.9 4.0 - 10.5 K/uL   RBC 3.47 (L) 4.22 - 5.81 MIL/uL   Hemoglobin 10.6 (L) 13.0 - 17.0 g/dL   HCT 34.0 (L) 39 - 52 %   MCV 98.0 80.0 - 100.0 fL   MCH 30.5 26.0 - 34.0 pg   MCHC 31.2 30.0 - 36.0 g/dL   RDW 14.5 11.5 - 15.5 %   Platelets 161 150 - 400 K/uL   nRBC 0.0 0.0 - 0.2 %   Neutrophils Relative % 64 %   Neutro Abs 4.5 1.7 - 7.7 K/uL   Lymphocytes Relative 24 %   Lymphs Abs 1.7 0.7 - 4.0 K/uL   Monocytes Relative 9 %   Monocytes Absolute 0.6 0 - 1 K/uL   Eosinophils Relative 2 %   Eosinophils Absolute 0.1 0 - 0 K/uL   Basophils Relative 0 %   Basophils Absolute 0.0  0 - 0 K/uL   Immature Granulocytes 1 %   Abs Immature Granulocytes 0.04 0.00 - 0.07 K/uL  Magnesium     Status: None   Collection Time: 01/23/20  5:28 AM  Result Value Ref Range   Magnesium 1.9 1.7 - 2.4 mg/dL  Phosphorus     Status: None   Collection Time: 01/23/20  5:28 AM  Result Value Ref Range   Phosphorus 3.0 2.5 - 4.6 mg/dL  Sedimentation rate     Status: Abnormal   Collection Time: 01/23/20  5:28 AM  Result Value Ref Range   Sed Rate 110 (H) 0 - 16 mm/hr    Recent Results (from the past 240 hour(s))  C Difficile Quick Screen w PCR reflex     Status: None   Collection Time: 01/17/20 12:54 PM   Specimen: STOOL  Result Value Ref Range Status   C Diff antigen NEGATIVE NEGATIVE Final   C Diff toxin NEGATIVE NEGATIVE Final   C Diff interpretation No C. difficile detected.  Final    Comment: Performed at [facility], [place] [address], [place], [place] [address]  Blood Culture (routine x 2)     Status: Abnormal   Collection Time: 01/17/20  3:48 PM   Specimen: BLOOD RIGHT HAND  Result Value Ref Range Status   Specimen Description   Final    BLOOD RIGHT HAND Performed at [facility] [address], [place], [place] [address]    Special Requests   Final    BOTTLES DRAWN AEROBIC AND ANAEROBIC Blood Culture adequate volume Performed at [facility] [address] [place], [place] [address]    Culture  Setup Time   Final    GRAM NEGATIVE RODS ANAEROBIC BOTTLE ONLY Organism ID to follow CRITICAL RESULT CALLED TO, READ BACK BY AND VERIFIED WITH: D. Wofford PharmD 9:15 01/18/20 ([email or web address])    Culture (A)  Final    ESCHERICHIA COLI SUSCEPTIBILITIES PERFORMED ON PREVIOUS CULTURE WITHIN THE LAST 5 DAYS. Performed at [facility], [place] [address] [place], [place] [address]    Report Status 01/20/2020 FINAL  Final  Blood Culture (routine x 2)     Status: Abnormal   Collection Time: 01/17/20  3:48 PM   Specimen: BLOOD  LEFT HAND  Result Value Ref Range Status   Specimen Description   Final    BLOOD LEFT HAND  Performed at [facility], [place] [address] [place], [place] [address]    Special Requests   Final    BOTTLES DRAWN AEROBIC AND ANAEROBIC Blood Culture adequate volume Performed at [place] [facility] [address], [place], [place] [address]    Culture  Setup Time   Final    GRAM NEGATIVE RODS IN BOTH AEROBIC AND ANAEROBIC BOTTLES CRITICAL VALUE NOTED.  VALUE IS CONSISTENT WITH PREVIOUSLY REPORTED AND CALLED VALUE. Performed at [facility], [place] [address], [place], [place] [address]    Culture (A)  Final    ESCHERICHIA COLI Confirmed Extended Spectrum Beta-Lactamase Producer (ESBL).  In bloodstream infections from ESBL organisms, carbapenems are preferred over piperacillin/tazobactam. They are shown to have a lower risk of mortality.    Report Status 01/20/2020 FINAL  Final   Organism ID, Bacteria ESCHERICHIA COLI  Final      Susceptibility   Escherichia coli - MIC*    AMPICILLIN >=32 RESISTANT Resistant     CEFAZOLIN >=64 RESISTANT Resistant     CEFEPIME >=32 RESISTANT Resistant     CEFTAZIDIME >=64 RESISTANT Resistant     CEFTRIAXONE >=64 RESISTANT Resistant     CIPROFLOXACIN >=4 RESISTANT Resistant     GENTAMICIN <=1 SENSITIVE Sensitive     IMIPENEM <=0.25 SENSITIVE Sensitive     TRIMETH/SULFA >=320 RESISTANT Resistant     AMPICILLIN/SULBACTAM >=32 RESISTANT Resistant     PIP/TAZO 8 SENSITIVE Sensitive     * ESCHERICHIA COLI  Urine culture     Status: Abnormal   Collection Time: 01/17/20  3:48 PM   Specimen: In/Out Cath Urine  Result Value Ref Range Status   Specimen Description   Final    IN/OUT CATH URINE Performed at [facility] [address], [place], [place] [address]    Special Requests   Final    NONE Performed at [facility], [place] [address] [place], [place] [address]    Culture (A)   Final    >=100,000 COLONIES/mL ESCHERICHIA COLI Confirmed Extended Spectrum Beta-Lactamase Producer (ESBL).  In bloodstream infections from ESBL organisms, carbapenems are preferred over piperacillin/tazobactam. They are shown to have a lower risk of mortality.    Report Status 01/19/2020 FINAL  Final   Organism ID, Bacteria ESCHERICHIA COLI (A)  Final      Susceptibility   Escherichia coli - MIC*    AMPICILLIN >=32 RESISTANT Resistant     CEFAZOLIN >=64 RESISTANT Resistant     CEFTRIAXONE >=64 RESISTANT Resistant     CIPROFLOXACIN >=4 RESISTANT Resistant     GENTAMICIN <=1 SENSITIVE Sensitive     IMIPENEM <=0.25 SENSITIVE Sensitive     NITROFURANTOIN 32 SENSITIVE Sensitive     TRIMETH/SULFA >=320 RESISTANT Resistant     AMPICILLIN/SULBACTAM >=32 RESISTANT Resistant     PIP/TAZO 8 SENSITIVE Sensitive     * >=100,000 COLONIES/mL ESCHERICHIA COLI  Blood Culture ID Panel (Reflexed)     Status: Abnormal   Collection Time: 01/17/20  3:48 PM  Result Value Ref Range Status   Enterococcus faecalis NOT DETECTED NOT DETECTED Final   Enterococcus Faecium NOT DETECTED NOT DETECTED Final   Listeria monocytogenes NOT DETECTED NOT DETECTED Final   Staphylococcus species NOT DETECTED NOT DETECTED Final   Staphylococcus aureus (BCID) NOT DETECTED NOT DETECTED Final   Staphylococcus epidermidis NOT DETECTED NOT DETECTED Final   Staphylococcus lugdunensis NOT DETECTED NOT DETECTED Final   Streptococcus species NOT DETECTED NOT DETECTED Final  Streptococcus agalactiae NOT DETECTED NOT DETECTED Final   Streptococcus pneumoniae NOT DETECTED NOT DETECTED Final   Streptococcus pyogenes NOT DETECTED NOT DETECTED Final   A.calcoaceticus-baumannii NOT DETECTED NOT DETECTED Final   Bacteroides fragilis NOT DETECTED NOT DETECTED Final   Enterobacterales DETECTED (A) NOT DETECTED Final    Comment: Enterobacterales represent a large order of gram negative bacteria, not a single organism.   Enterobacter  cloacae complex NOT DETECTED NOT DETECTED Final   Escherichia coli DETECTED (A) NOT DETECTED Final    Comment: CRITICAL RESULT CALLED TO, READ BACK BY AND VERIFIED WITH: D. Wofford PharmD 9:15 01/18/20 ([email or web address])    Klebsiella aerogenes NOT DETECTED NOT DETECTED Final   Klebsiella oxytoca NOT DETECTED NOT DETECTED Final   Klebsiella pneumoniae NOT DETECTED NOT DETECTED Final   Proteus species NOT DETECTED NOT DETECTED Final   Salmonella species NOT DETECTED NOT DETECTED Final   Serratia marcescens NOT DETECTED NOT DETECTED Final   Haemophilus influenzae NOT DETECTED NOT DETECTED Final   Neisseria meningitidis NOT DETECTED NOT DETECTED Final   Pseudomonas aeruginosa NOT DETECTED NOT DETECTED Final   Stenotrophomonas maltophilia NOT DETECTED NOT DETECTED Final   Candida albicans NOT DETECTED NOT DETECTED Final   Candida auris NOT DETECTED NOT DETECTED Final   Candida glabrata NOT DETECTED NOT DETECTED Final   Candida krusei NOT DETECTED NOT DETECTED Final   Candida parapsilosis NOT DETECTED NOT DETECTED Final   Candida tropicalis NOT DETECTED NOT DETECTED Final   Cryptococcus neoformans/gattii NOT DETECTED NOT DETECTED Final   CTX-M ESBL DETECTED (A) NOT DETECTED Final    Comment: CRITICAL RESULT CALLED TO, READ BACK BY AND VERIFIED WITH: D. Wofford PharmD 9:15 01/18/20 ([email or web address]) (NOTE) Extended spectrum beta-lactamase detected. Recommend a carbapenem as initial therapy.      Carbapenem resistance IMP NOT DETECTED NOT DETECTED Final   Carbapenem resistance KPC NOT DETECTED NOT DETECTED Final   Carbapenem resistance NDM NOT DETECTED NOT DETECTED Final   Carbapenem resist OXA 48 LIKE NOT DETECTED NOT DETECTED Final   Carbapenem resistance VIM NOT DETECTED NOT DETECTED Final    Comment: Performed at [facility], [place] [address], [place], [place] [address]  SARS Coronavirus 2 by RT PCR (hospital order, performed in [facility] hospital lab) Nasopharyngeal Nasopharyngeal Swab      Status: None   Collection Time: 01/17/20  6:40 PM   Specimen: Nasopharyngeal Swab  Result Value Ref Range Status   SARS Coronavirus 2 NEGATIVE NEGATIVE Final    Comment: (NOTE) SARS-CoV-2 target nucleic acids are NOT DETECTED.  The SARS-CoV-2 RNA is generally detectable in upper and lower respiratory specimens during the acute phase of infection. The lowest concentration of SARS-CoV-2 viral copies this assay can detect is 250 copies / mL. A negative result does not preclude SARS-CoV-2 infection and should not be used as the sole basis for treatment or other patient management decisions.  A negative result may occur with improper specimen collection / handling, submission of specimen other than nasopharyngeal swab, presence of viral mutation(s) within the areas targeted by this assay, and inadequate number of viral copies (<250 copies / mL). A negative result must be combined with clinical observations, patient history, and epidemiological information.  Fact Sheet for Patients:   StrictlyIdeas.no  Fact Sheet for Healthcare Providers: BankingDealers.co.za  This test is not yet approved or  cleared by the [place] FDA and has been authorized for detection and/or diagnosis of SARS-CoV-2 by FDA under an Emergency Use Authorization (EUA).  This  EUA will remain in effect (meaning this test can be used) for the duration of the COVID-19 declaration under Section 564(b)(1) of the Act, 21 U.S.C. section 360bbb-3(b)(1), unless the authorization is terminated or revoked sooner.  Performed at [facility], [place] [address] [place], [place] [address]   MRSA PCR Screening     Status: Abnormal   Collection Time: 01/19/20  7:46 AM   Specimen: Nasal Mucosa; Nasopharyngeal  Result Value Ref Range Status   MRSA by PCR POSITIVE (A) NEGATIVE Final    Comment:        The GeneXpert MRSA Assay (FDA approved for NASAL  specimens only), is one component of a comprehensive MRSA colonization surveillance program. It is not intended to diagnose MRSA infection nor to guide or monitor treatment for MRSA infections. RESULT CALLED TO, READ BACK BY AND VERIFIED WITH: H.SEGERS AT 1341 ON 01/19/20 BY N.THOMPSON Performed at [facility], [place] [address] [place], [place] [address]   Culture, blood (routine x 2)     Status: None (Preliminary result)   Collection Time: 01/19/20 10:19 AM   Specimen: BLOOD RIGHT HAND  Result Value Ref Range Status   Specimen Description   Final    BLOOD RIGHT HAND Performed at [facility] [address], [place], [place] [address]    Special Requests   Final    BOTTLES DRAWN AEROBIC ONLY Blood Culture results may not be optimal due to an inadequate volume of blood received in culture bottles Performed at [facility] [address] [place], [place] [address]    Culture   Final    NO GROWTH 4 DAYS Performed at [facility], [place] [address] [place], [place] [address]    Report Status PENDING  Incomplete  Culture, blood (routine x 2)     Status: None (Preliminary result)   Collection Time: 01/19/20 10:19 AM   Specimen: BLOOD LEFT HAND  Result Value Ref Range Status   Specimen Description   Final    BLOOD LEFT HAND Performed at [place] [address], [place], [place] [address]    Special Requests   Final    BOTTLES DRAWN AEROBIC AND ANAEROBIC Blood Culture adequate volume Performed at [place] [address], [place], [place] [address]    Culture   Final    NO GROWTH 4 DAYS Performed at [facility], [place] [address], [place], [place] [address]    Report Status PENDING  Incomplete     Radiology Studies: No results found. CT ABDOMEN PELVIS WO CONTRAST  Final Result    DG Chest 2 View  Final Result       Scheduled Meds: . acetaminophen  1,000  mg Oral Q6H  . ascorbic acid  500 mg Oral Daily  . Chlorhexidine Gluconate Cloth  6 each Topical Daily  . Chlorhexidine Gluconate Cloth  6 each Topical Q0600  . enoxaparin (LOVENOX) injection  40 mg Subcutaneous Q24H  . gabapentin  300 mg Oral BID  . mouth rinse  15 mL Mouth Rinse BID  . multivitamin with minerals  1 tablet Oral Daily  . mupirocin ointment  1 application Nasal BID  . omega-3 acid ethyl esters  1 g Oral BID  . pantoprazole  40 mg Oral Daily  . predniSONE  30 mg Oral Q breakfast  . vitamin B-12  100 mcg Oral Daily  . Vitamin D (Ergocalciferol)  50,000 Units  Oral Q7 days  . zolpidem  5 mg Oral Once   PRN Meds: dicyclomine, hydrALAZINE, HYDROmorphone (DILAUDID) injection, labetalol, loperamide, melatonin, metoprolol tartrate, ondansetron **OR** ondansetron (ZOFRAN) IV, oxyCODONE, psyllium, senna-docusate Continuous Infusions: . meropenem (MERREM) IV 1 g (01/23/20 1006)      LOS: 6 days  Time spent: Greater than 50% of the 35 minute visit was spent in counseling/coordination of care for the patient as laid out in the A&P.   Dwyane Dee, MD [facility] 01/23/2020, 1:21 PM   Contact via secure chat.  To contact the attending provider between 7A-7P or the covering provider during after hours 7P-7A, please log into the web site [email or web address] and access using universal [place] password for that web site. If you do not have the password, please call the hospital operator.

## 2020-01-23 NOTE — Progress Notes (Signed)
Patient ID: Matthew Francis, male   DOB: March 26, 1961, 59 y.o.   MRN: [id number]         [facility] for Infectious Disease  Date of Admission:  01/17/2020           Day 6 meropenem ASSESSMENT: He is improving on therapy for ESBL E. coli bacteremia.  PLAN: 1. Continue meropenem for 1 more day 2. I will sign off now  Principal Problem:   E coli bacteremia Active Problems:   UTI due to extended-spectrum beta lactamase (ESBL) producing Escherichia coli   Severe sepsis (HCC)   AKI (acute kidney injury) (HCC)   Thrombocytopenia (HCC)   Essential hypertension   Ulcerative colitis ([place])   Scheduled Meds: . acetaminophen  1,000 mg Oral Q6H  . ascorbic acid  500 mg Oral Daily  . Chlorhexidine Gluconate Cloth  6 each Topical Daily  . Chlorhexidine Gluconate Cloth  6 each Topical Q0600  . enoxaparin (LOVENOX) injection  40 mg Subcutaneous Q24H  . gabapentin  300 mg Oral BID  . mouth rinse  15 mL Mouth Rinse BID  . multivitamin with minerals  1 tablet Oral Daily  . mupirocin ointment  1 application Nasal BID  . omega-3 acid ethyl esters  1 g Oral BID  . pantoprazole  40 mg Oral Daily  . predniSONE  30 mg Oral Q breakfast  . vitamin B-12  100 mcg Oral Daily  . Vitamin D (Ergocalciferol)  50,000 Units Oral Q7 days  . zolpidem  5 mg Oral Once   Continuous Infusions: . meropenem (MERREM) IV 1 g (01/23/20 1006)   PRN Meds:.dicyclomine, hydrALAZINE, HYDROmorphone (DILAUDID) injection, labetalol, loperamide, melatonin, metoprolol tartrate, ondansetron **OR** ondansetron (ZOFRAN) IV, oxyCODONE, psyllium, senna-docusate   SUBJECTIVE: He feels much better.  Review of Systems: Review of Systems  Constitutional: Negative for fever.  Respiratory: Negative for cough.   Cardiovascular: Negative for chest pain.  Gastrointestinal: Positive for diarrhea. Negative for abdominal pain, nausea and vomiting.  Genitourinary: Negative for dysuria.    Allergies  Allergen Reactions  . Asacol  [Mesalamine] Swelling    Throat   . Penicillins Anaphylaxis    Has patient had a PCN reaction causing immediate rash, facial/tongue/throat swelling, SOB or lightheadedness with hypotension: Yes Has patient had a PCN reaction causing severe rash involving mucus membranes or skin necrosis: No Has patient had a PCN reaction that required hospitalization: Yes Has patient had a PCN reaction occurring within the last 10 years: No If all of the above answers are "NO", then may proceed with Cephalosporin use.  . Sulfa Antibiotics Anaphylaxis  . Bee Venom Hives and Swelling  . Iodinated Diagnostic Agents     Pt reports allergy at [facility]. Rapid heartbeat and dyspnea    OBJECTIVE: Vitals:   01/22/20 1358 01/22/20 2012 01/23/20 0550 01/23/20 1353  BP: (!) 122/91 (!) 138/96 (!) 132/95 (!) 138/103  Pulse: 90 89 83 87  Resp: 15 18 16 20   Temp: 98.5 F (36.9 C) 98.2 F (36.8 C) 98.2 F (36.8 C) 98 F (36.7 C)  TempSrc: Oral Oral Oral Oral  SpO2: 98% 100% 99% 99%  Weight:      Height:       Body mass index is 33.78 kg/m.  Physical Exam Constitutional:      General: He is not in acute distress.    Comments: He is in good spirits.  Cardiovascular:     Rate and Rhythm: Normal rate and regular rhythm.  Heart sounds: No murmur heard.   Pulmonary:     Effort: Pulmonary effort is normal.     Breath sounds: Normal breath sounds.  Abdominal:     Palpations: Abdomen is soft.     Tenderness: There is no abdominal tenderness.  Psychiatric:        Mood and Affect: Mood normal.     Lab Results Lab Results  Component Value Date   WBC 6.9 01/23/2020   HGB 10.6 (L) 01/23/2020   HCT 34.0 (L) 01/23/2020   MCV 98.0 01/23/2020   PLT 161 01/23/2020    Lab Results  Component Value Date   CREATININE 1.34 (H) 01/23/2020   BUN 15 01/23/2020   NA 140 01/23/2020   K 4.0 01/23/2020   CL 106 01/23/2020   CO2 26 01/23/2020    Lab Results  Component Value Date   ALT 64 (H) 01/17/2020     AST 77 (H) 01/17/2020   ALKPHOS 83 01/17/2020   BILITOT 1.5 (H) 01/17/2020     Microbiology: Recent Results (from the past 240 hour(s))  C Difficile Quick Screen w PCR reflex     Status: None   Collection Time: 01/17/20 12:54 PM   Specimen: STOOL  Result Value Ref Range Status   C Diff antigen NEGATIVE NEGATIVE Final   C Diff toxin NEGATIVE NEGATIVE Final   C Diff interpretation No C. difficile detected.  Final    Comment: Performed at [facility], [place] [address] [place], [place] [address]  Blood Culture (routine x 2)     Status: Abnormal   Collection Time: 01/17/20  3:48 PM   Specimen: BLOOD RIGHT HAND  Result Value Ref Range Status   Specimen Description   Final    BLOOD RIGHT HAND Performed at [facility] [address] [place], [place] [address]    Special Requests   Final    BOTTLES DRAWN AEROBIC AND ANAEROBIC Blood Culture adequate volume Performed at [facility] [address] [place], [place] [address]    Culture  Setup Time   Final    GRAM NEGATIVE RODS ANAEROBIC BOTTLE ONLY Organism ID to follow CRITICAL RESULT CALLED TO, READ BACK BY AND VERIFIED WITH: D. Wofford PharmD 9:15 01/18/20 ([email or web address])    Culture (A)  Final    ESCHERICHIA COLI SUSCEPTIBILITIES PERFORMED ON PREVIOUS CULTURE WITHIN THE LAST 5 DAYS. Performed at [facility], [place] [address] [place], [place] [address]    Report Status 01/20/2020 FINAL  Final  Blood Culture (routine x 2)     Status: Abnormal   Collection Time: 01/17/20  3:48 PM   Specimen: BLOOD LEFT HAND  Result Value Ref Range Status   Specimen Description   Final    BLOOD LEFT HAND Performed at [facility] [address], [place], [place] [address]    Special Requests   Final    BOTTLES DRAWN AEROBIC AND ANAEROBIC Blood Culture adequate volume Performed at [facility] [address], [place], [place] [address]     Culture  Setup Time   Final    GRAM NEGATIVE RODS IN BOTH AEROBIC AND ANAEROBIC BOTTLES CRITICAL VALUE NOTED.  VALUE IS CONSISTENT WITH PREVIOUSLY REPORTED AND CALLED VALUE. Performed at [facility], [place] [address] [place], [place] [address]    Culture (A)  Final    ESCHERICHIA COLI Confirmed Extended Spectrum Beta-Lactamase Producer (ESBL).  In bloodstream infections from ESBL organisms, carbapenems  are preferred over piperacillin/tazobactam. They are shown to have a lower risk of mortality.    Report Status 01/20/2020 FINAL  Final   Organism ID, Bacteria ESCHERICHIA COLI  Final      Susceptibility   Escherichia coli - MIC*    AMPICILLIN >=32 RESISTANT Resistant     CEFAZOLIN >=64 RESISTANT Resistant     CEFEPIME >=32 RESISTANT Resistant     CEFTAZIDIME >=64 RESISTANT Resistant     CEFTRIAXONE >=64 RESISTANT Resistant     CIPROFLOXACIN >=4 RESISTANT Resistant     GENTAMICIN <=1 SENSITIVE Sensitive     IMIPENEM <=0.25 SENSITIVE Sensitive     TRIMETH/SULFA >=320 RESISTANT Resistant     AMPICILLIN/SULBACTAM >=32 RESISTANT Resistant     PIP/TAZO 8 SENSITIVE Sensitive     * ESCHERICHIA COLI  Urine culture     Status: Abnormal   Collection Time: 01/17/20  3:48 PM   Specimen: In/Out Cath Urine  Result Value Ref Range Status   Specimen Description   Final    IN/OUT CATH URINE Performed at [facility] [address] [place], [place] [address]    Special Requests   Final    NONE Performed at [facility], [place] [address] [place], [place] [address]    Culture (A)  Final    >=100,000 COLONIES/mL ESCHERICHIA COLI Confirmed Extended Spectrum Beta-Lactamase Producer (ESBL).  In bloodstream infections from ESBL organisms, carbapenems are preferred over piperacillin/tazobactam. They are shown to have a lower risk of mortality.    Report Status 01/19/2020 FINAL  Final   Organism ID, Bacteria ESCHERICHIA COLI (A)  Final      Susceptibility    Escherichia coli - MIC*    AMPICILLIN >=32 RESISTANT Resistant     CEFAZOLIN >=64 RESISTANT Resistant     CEFTRIAXONE >=64 RESISTANT Resistant     CIPROFLOXACIN >=4 RESISTANT Resistant     GENTAMICIN <=1 SENSITIVE Sensitive     IMIPENEM <=0.25 SENSITIVE Sensitive     NITROFURANTOIN 32 SENSITIVE Sensitive     TRIMETH/SULFA >=320 RESISTANT Resistant     AMPICILLIN/SULBACTAM >=32 RESISTANT Resistant     PIP/TAZO 8 SENSITIVE Sensitive     * >=100,000 COLONIES/mL ESCHERICHIA COLI  Blood Culture ID Panel (Reflexed)     Status: Abnormal   Collection Time: 01/17/20  3:48 PM  Result Value Ref Range Status   Enterococcus faecalis NOT DETECTED NOT DETECTED Final   Enterococcus Faecium NOT DETECTED NOT DETECTED Final   Listeria monocytogenes NOT DETECTED NOT DETECTED Final   Staphylococcus species NOT DETECTED NOT DETECTED Final   Staphylococcus aureus (BCID) NOT DETECTED NOT DETECTED Final   Staphylococcus epidermidis NOT DETECTED NOT DETECTED Final   Staphylococcus lugdunensis NOT DETECTED NOT DETECTED Final   Streptococcus species NOT DETECTED NOT DETECTED Final   Streptococcus agalactiae NOT DETECTED NOT DETECTED Final   Streptococcus pneumoniae NOT DETECTED NOT DETECTED Final   Streptococcus pyogenes NOT DETECTED NOT DETECTED Final   A.calcoaceticus-baumannii NOT DETECTED NOT DETECTED Final   Bacteroides fragilis NOT DETECTED NOT DETECTED Final   Enterobacterales DETECTED (A) NOT DETECTED Final    Comment: Enterobacterales represent a large order of gram negative bacteria, not a single organism.   Enterobacter cloacae complex NOT DETECTED NOT DETECTED Final   Escherichia coli DETECTED (A) NOT DETECTED Final    Comment: CRITICAL RESULT CALLED TO, READ BACK BY AND VERIFIED WITH: D. Wofford PharmD 9:15 01/18/20 ([email or web address])    Klebsiella aerogenes NOT DETECTED NOT DETECTED Final   Klebsiella  oxytoca NOT DETECTED NOT DETECTED Final   Klebsiella pneumoniae NOT DETECTED NOT DETECTED Final    Proteus species NOT DETECTED NOT DETECTED Final   Salmonella species NOT DETECTED NOT DETECTED Final   Serratia marcescens NOT DETECTED NOT DETECTED Final   Haemophilus influenzae NOT DETECTED NOT DETECTED Final   Neisseria meningitidis NOT DETECTED NOT DETECTED Final   Pseudomonas aeruginosa NOT DETECTED NOT DETECTED Final   Stenotrophomonas maltophilia NOT DETECTED NOT DETECTED Final   Candida albicans NOT DETECTED NOT DETECTED Final   Candida auris NOT DETECTED NOT DETECTED Final   Candida glabrata NOT DETECTED NOT DETECTED Final   Candida krusei NOT DETECTED NOT DETECTED Final   Candida parapsilosis NOT DETECTED NOT DETECTED Final   Candida tropicalis NOT DETECTED NOT DETECTED Final   Cryptococcus neoformans/gattii NOT DETECTED NOT DETECTED Final   CTX-M ESBL DETECTED (A) NOT DETECTED Final    Comment: CRITICAL RESULT CALLED TO, READ BACK BY AND VERIFIED WITH: D. Wofford PharmD 9:15 01/18/20 ([email or web address]) (NOTE) Extended spectrum beta-lactamase detected. Recommend a carbapenem as initial therapy.      Carbapenem resistance IMP NOT DETECTED NOT DETECTED Final   Carbapenem resistance KPC NOT DETECTED NOT DETECTED Final   Carbapenem resistance NDM NOT DETECTED NOT DETECTED Final   Carbapenem resist OXA 48 LIKE NOT DETECTED NOT DETECTED Final   Carbapenem resistance VIM NOT DETECTED NOT DETECTED Final    Comment: Performed at [facility], [place] [address] [place], [place] [address]  SARS Coronavirus 2 by RT PCR (hospital order, performed in [facility] hospital lab) Nasopharyngeal Nasopharyngeal Swab     Status: None   Collection Time: 01/17/20  6:40 PM   Specimen: Nasopharyngeal Swab  Result Value Ref Range Status   SARS Coronavirus 2 NEGATIVE NEGATIVE Final    Comment: (NOTE) SARS-CoV-2 target nucleic acids are NOT DETECTED.  The SARS-CoV-2 RNA is generally detectable in upper and lower respiratory specimens during the acute phase of infection. The lowest concentration of  SARS-CoV-2 viral copies this assay can detect is 250 copies / mL. A negative result does not preclude SARS-CoV-2 infection and should not be used as the sole basis for treatment or other patient management decisions.  A negative result may occur with improper specimen collection / handling, submission of specimen other than nasopharyngeal swab, presence of viral mutation(s) within the areas targeted by this assay, and inadequate number of viral copies (<250 copies / mL). A negative result must be combined with clinical observations, patient history, and epidemiological information.  Fact Sheet for Patients:   StrictlyIdeas.no  Fact Sheet for Healthcare Providers: BankingDealers.co.za  This test is not yet approved or  cleared by the [place] FDA and has been authorized for detection and/or diagnosis of SARS-CoV-2 by FDA under an Emergency Use Authorization (EUA).  This EUA will remain in effect (meaning this test can be used) for the duration of the COVID-19 declaration under Section 564(b)(1) of the Act, 21 U.S.C. section 360bbb-3(b)(1), unless the authorization is terminated or revoked sooner.  Performed at [facility], [place] [address], [place], [place] [address]   MRSA PCR Screening     Status: Abnormal   Collection Time: 01/19/20  7:46 AM   Specimen: Nasal Mucosa; Nasopharyngeal  Result Value Ref Range Status   MRSA by PCR POSITIVE (A) NEGATIVE Final    Comment:        The GeneXpert MRSA Assay (FDA approved for NASAL specimens only), is one component of a comprehensive MRSA colonization  surveillance program. It is not intended to diagnose MRSA infection nor to guide or monitor treatment for MRSA infections. RESULT CALLED TO, READ BACK BY AND VERIFIED WITH: H.SEGERS AT 1341 ON 01/19/20 BY N.THOMPSON Performed at [facility], [place] [address], [place], [place] [address]   Culture,  blood (routine x 2)     Status: None (Preliminary result)   Collection Time: 01/19/20 10:19 AM   Specimen: BLOOD RIGHT HAND  Result Value Ref Range Status   Specimen Description   Final    BLOOD RIGHT HAND Performed at [facility] [address], [place], [place] [address]    Special Requests   Final    BOTTLES DRAWN AEROBIC ONLY Blood Culture results may not be optimal due to an inadequate volume of blood received in culture bottles Performed at [facility] [address] [place], [place] [address]    Culture   Final    NO GROWTH 4 DAYS Performed at [facility], [place] [address], [place], [place] [address]    Report Status PENDING  Incomplete  Culture, blood (routine x 2)     Status: None (Preliminary result)   Collection Time: 01/19/20 10:19 AM   Specimen: BLOOD LEFT HAND  Result Value Ref Range Status   Specimen Description   Final    BLOOD LEFT HAND Performed at [facility] [address] [place], [place] [address]    Special Requests   Final    BOTTLES DRAWN AEROBIC AND ANAEROBIC Blood Culture adequate volume Performed at [facility] [address], [place], [place] [address]    Culture   Final    NO GROWTH 4 DAYS Performed at [facility], [place] [address] [place], [place] [address]    Report Status PENDING  Incomplete    Michel Bickers, MD [facility] for [facility] [phone] pager   [phone] cell 01/23/2020, 4:01 PM

## 2020-01-23 NOTE — Plan of Care (Signed)
  Problem: Education: Goal: Knowledge of General Education information will improve Description: Including pain rating scale, medication(s)/side effects and non-pharmacologic comfort measures Outcome: Progressing   Problem: Health Behavior/Discharge Planning: Goal: Ability to manage health-related needs will improve Outcome: Progressing   Problem: Clinical Measurements: Goal: Ability to maintain clinical measurements within normal limits will improve Outcome: Progressing Goal: Respiratory complications will improve Outcome: Progressing Goal: Cardiovascular complication will be avoided Outcome: Progressing   Problem: Activity: Goal: Risk for activity intolerance will decrease Outcome: Progressing   Problem: Elimination: Goal: Will not experience complications related to bowel motility Outcome: Progressing Goal: Will not experience complications related to urinary retention Outcome: Progressing   Problem: Pain Managment: Goal: General experience of comfort will improve Outcome: Progressing   Problem: Safety: Goal: Ability to remain free from injury will improve Outcome: Progressing

## 2020-01-23 NOTE — Progress Notes (Signed)
Patient stated he would like RN to tell the physician that he is having a ulcerative colitis flareup and this could have been avoided if he was receiving his prednisone. Patient states he is also having some blood in his stool. RN placed a hat in the toilet for the next BM.   Jeannette Corpus notified of the above information. Orders received - CBC with Diff & hold blood thinners.

## 2020-01-24 LAB — BASIC METABOLIC PANEL WITH GFR
Anion gap: 9 (ref 5–15)
BUN: 13 mg/dL (ref 6–20)
CO2: 29 mmol/L (ref 22–32)
Calcium: 9.7 mg/dL (ref 8.9–10.3)
Chloride: 103 mmol/L (ref 98–111)
Creatinine, Ser: 1.08 mg/dL (ref 0.61–1.24)
GFR calc Af Amer: >60 mL/min
GFR calc non Af Amer: >60 mL/min
Glucose, Bld: 145 mg/dL — ABNORMAL HIGH (ref 70–99)
Potassium: 3.7 mmol/L (ref 3.5–5.1)
Sodium: 141 mmol/L (ref 135–145)

## 2020-01-24 LAB — CULTURE, BLOOD (ROUTINE X 2)
Culture: NO GROWTH
Culture: NO GROWTH
Special Requests: ADEQUATE

## 2020-01-24 LAB — CBC WITH DIFFERENTIAL/PLATELET
Abs Immature Granulocytes: 0.07 K/uL (ref 0.00–0.07)
Basophils Absolute: 0 K/uL (ref 0.0–0.1)
Basophils Relative: 0 %
Eosinophils Absolute: 0.1 K/uL (ref 0.0–0.5)
Eosinophils Relative: 1 %
HCT: 33.6 % — ABNORMAL LOW (ref 39.0–52.0)
Hemoglobin: 10.4 g/dL — ABNORMAL LOW (ref 13.0–17.0)
Immature Granulocytes: 1 %
Lymphocytes Relative: 24 %
Lymphs Abs: 1.8 K/uL (ref 0.7–4.0)
MCH: 30.2 pg (ref 26.0–34.0)
MCHC: 31 g/dL (ref 30.0–36.0)
MCV: 97.7 fL (ref 80.0–100.0)
Monocytes Absolute: 0.7 K/uL (ref 0.1–1.0)
Monocytes Relative: 9 %
Neutro Abs: 5 K/uL (ref 1.7–7.7)
Neutrophils Relative %: 65 %
Platelets: 185 K/uL (ref 150–400)
RBC: 3.44 MIL/uL — ABNORMAL LOW (ref 4.22–5.81)
RDW: 14.5 % (ref 11.5–15.5)
WBC: 7.6 K/uL (ref 4.0–10.5)
nRBC: 0 % (ref 0.0–0.2)

## 2020-01-24 LAB — C-REACTIVE PROTEIN: CRP: 1.3 mg/dL — ABNORMAL HIGH (ref <1.0)

## 2020-01-24 LAB — MAGNESIUM: Magnesium: 2 mg/dL (ref 1.7–2.4)

## 2020-01-24 NOTE — Discharge Summary (Signed)
Physician Discharge Summary  Matthew Francis [id number] DOB: [birth year] DOA: 01/17/2020  PCP: Charline Bills, MD  Admit date: 01/17/2020 Discharge date: 01/24/2020  Admitted From: Home Disposition:  Home  Discharge Condition:Stable CODE STATUS:FULL Diet recommendation: Heart Healthy   Brief/Interim Summary: Matthew D Banksis a 59 y.o.malewithhistory of ulcerative colitis with total colectomy in 5732 complicated by pouchitis for which patient was recently started on prednisone by patient's gastroenterologist in [facility]. Patient is also on Cipro for anti-inflammatory properties for the same reason.  He began developing fevers, chills, low back pain, and worsening abdominal pain which prompted him to come to the ER for further evaluation. He denied any cough, shortness of breath, vomiting, nor chest pain. In the ER he was found to be mildly febrile, 100.3 with elevated lactic acid, 3. He also had leukocytosis, 20.7 and elevated creatinine, 1.58. Urinalysis was concerning for UTI and as he appeared septic, he was started on vancomycin, cefepime, Flagyl. Blood cultures were also collected. COVID-19 test was also performed and was negative. Soon after admission, blood cultures returned positive for 3/4 bottles with ESBL E. coli. His antibiotics were modified to monotherapy meropenem. Repeat blood cultures were drawn on 01/19/2020.  Infectious disease was also consulted for further recommendations regarding length of course of antibiotics.  He was recommended to complete a 7-day course total of meropenem, for end date of 01/24/2020.  He is hemodynamically stable for discharge home today.  Following problems were addressed during his hospitalization:  Severe sepsis ([place]) - met SIRS on admission with source of infection at this time felt to be urinary and has now translocated to blood (E. Coli ESBL). HR 133, RR 36, WBC 20.7, Lactic 3 initially -Hydrocortisone initially started on admission has  been tapered off.  BP remained stable - s/p vanc, cefepime, [name]. This was changed to meropenem as blood cultures resulted with ESBL E. 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 Patient on chronic Cipro for anti-inflammatory properties and intermittent prednisone - recently has been on long prednisone taper at home; on admission was on prednisone 30 mg daily (see med rec) - resume prednisone 30 mg daily on 8/9; continue taper as previously doing at discharge   E coli bacteremia -See severe sepsis -Admission cultures growing 3/4 bottles with ESBL E. coli -Repeat blood cultures on 01/19/2020 and follow for clearance -7-day course of meropenem . End date 01/24/2020  Atrial fibrillation with RVR ([place]) -Patient converted into A. fib on night of 01/18/2020.  No prior history of A. fib and he has had a cardiac work-up in the past for other reasons with no arrhythmia noted at that time -Etiology is considered in setting of severe sepsis infection with bacteremia.  No further work-up at this time unless reoccurs outside setting of severe infection.  Discussed with patient as well -Continue Cardizem and will titrate off as able; converted to NSR on 01/20/2020 - remains in NSR, occasional sinus tach not unexpected in setting of resolving infection and some deconditioning    Discharge Diagnoses:  Principal Problem:   E coli bacteremia Active Problems:   Severe sepsis ([place])   AKI (acute kidney injury) ([place])  Thrombocytopenia ([place])   Essential hypertension   Ulcerative colitis ([place])   UTI due to extended-spectrum  beta lactamase (ESBL) producing Escherichia coli    Discharge Instructions  Discharge Instructions    Diet - low sodium heart healthy   Complete by: As directed    Discharge instructions   Complete by: As directed    1)Continue your home medications 2)Follow up with your PCP in a week   Increase activity slowly   Complete by: As directed      Allergies as of 01/24/2020      Reactions   Asacol [mesalamine] Swelling   Throat   Penicillins Anaphylaxis   Has patient had a PCN reaction causing immediate rash, facial/tongue/throat swelling, SOB or lightheadedness with hypotension: Yes Has patient had a PCN reaction causing severe rash involving mucus membranes or skin necrosis: No Has patient had a PCN reaction that required hospitalization: Yes Has patient had a PCN reaction occurring within the last 10 years: No If all of the above answers are "NO", then may proceed with Cephalosporin use.   Sulfa Antibiotics Anaphylaxis   Bee Venom Hives, Swelling   Iodinated Diagnostic Agents    Pt reports allergy at [facility]. Rapid heartbeat and dyspnea      Medication List    STOP taking these medications   ciprofloxacin 500 MG tablet Commonly known as: CIPRO   HYDROcodone-acetaminophen 5-325 MG tablet Commonly known as: NORCO/VICODIN   omeprazole 20 MG capsule Commonly known as: PRILOSEC   oxyCODONE-acetaminophen 5-325 MG tablet Commonly known as: PERCOCET/ROXICET     TAKE these medications   acetaminophen 500 MG tablet Commonly known as: TYLENOL Take 500 mg by mouth every 6 (six) hours as needed for mild pain.   amLODipine 5 MG tablet Commonly known as: NORVASC Take 5 mg by mouth daily.   ascorbic acid 500 MG tablet Commonly known as: VITAMIN C Take 500 mg by mouth daily.   atenolol 25 MG tablet Commonly known as: TENORMIN Take 1 tablet by mouth daily.   cetirizine 10 MG tablet Commonly known as: ZYRTEC Take 10 mg by mouth daily as needed for allergies.    dicyclomine 20 MG tablet Commonly known as: BENTYL Take 1 tablet (20 mg total) by mouth 2 (two) times daily as needed for spasms.   esomeprazole 20 MG capsule Commonly known as: NEXIUM Take 20 mg by mouth daily.   ezetimibe 10 MG tablet Commonly known as: ZETIA Take 10 mg by mouth daily.   Fish Oil 1000 MG Caps Take 1 capsule by mouth 2 (two) times daily.   gabapentin 300 MG capsule Commonly known as: NEURONTIN Take 300 mg by mouth 2 (two) times daily.   GLUCOSAMINE PO Take 1 tablet by mouth daily.   hydrALAZINE 25 MG tablet Commonly known as: APRESOLINE Take 25 mg by mouth in the morning and at bedtime.   loperamide 2 MG tablet Commonly known as: IMODIUM A-D Take 2 mg by mouth 2 (two) times daily.   multivitamin with minerals Tabs tablet Take 1 tablet by mouth daily.   predniSONE 10 MG tablet Commonly known as: DELTASONE Take 10 mg by mouth See admin instructions. Take Prednisone 40 mg daily by mouth for 2 weeks, then 30 mg, 20 mg, 15 mg, 10 mg, 5 mg, daily each for 1 week. Patient sates he is on the 30 mg course as of today 01-17-20   promethazine 25 MG tablet Commonly known as: PHENERGAN Take 1 tablet (25 mg total) by mouth  every 6 (six) hours as needed for nausea.   psyllium 58.6 % packet Commonly known as: METAMUCIL Take 1 packet by mouth daily as needed (For fiber).   QC TUMERIC COMPLEX PO Take 1 tablet by mouth daily as needed (For inflammatory).   rosuvastatin 10 MG tablet Commonly known as: CRESTOR Take 1 tablet by mouth daily.   vitamin B-12 100 MCG tablet Commonly known as: CYANOCOBALAMIN Take 100 mcg by mouth daily.   Vitamin D3 1.25 MG (50000 UT) Caps Take 50,000 Units by mouth once a week.       Follow-up Information    Charline Bills, MD. Schedule an appointment as soon as possible for a visit in 1 week(s).   Specialty: Family Medicine Contact information: [address] [place] [address] [place] [address] [phone]               Allergies  Allergen Reactions  . Asacol [Mesalamine] Swelling    Throat   . Penicillins Anaphylaxis    Has patient had a PCN reaction causing immediate rash, facial/tongue/throat swelling, SOB or lightheadedness with hypotension: Yes Has patient had a PCN reaction causing severe rash involving mucus membranes or skin necrosis: No Has patient had a PCN reaction that required hospitalization: Yes Has patient had a PCN reaction occurring within the last 10 years: No If all of the above answers are "NO", then may proceed with Cephalosporin use.  . Sulfa Antibiotics Anaphylaxis  . Bee Venom Hives and Swelling  . Iodinated Diagnostic Agents     Pt reports allergy at [facility]. Rapid heartbeat and dyspnea    Consultations:  ID   Procedures/Studies: CT ABDOMEN PELVIS WO CONTRAST  Result Date: 01/17/2020 CLINICAL DATA:  59 year old male with sepsis. EXAM: CT ABDOMEN AND PELVIS WITHOUT CONTRAST TECHNIQUE: Multidetector CT imaging of the abdomen and pelvis was performed following the standard protocol without IV contrast. COMPARISON:  CT abdomen pelvis dated 01/29/2012. FINDINGS: Evaluation of this exam is limited in the absence of intravenous contrast. Lower chest: The visualized lung bases are clear. No intra-abdominal free air or free fluid. Hepatobiliary: No focal liver abnormality is seen. No gallstones, gallbladder wall thickening, or biliary dilatation. Pancreas: Unremarkable. No pancreatic ductal dilatation or surrounding inflammatory changes. Spleen: Normal in size without focal abnormality. Adrenals/Urinary Tract: The adrenal glands unremarkable. There is no hydronephrosis or nephrolithiasis on either side. Mild bilateral perinephric stranding, nonspecific. Correlation with urinalysis recommended to exclude UTI. The visualized ureters and urinary bladder appear unremarkable. Stomach/Bowel: Postsurgical changes of colectomy. There is no bowel obstruction or active inflammation.  Vascular/Lymphatic: The abdominal aorta and IVC are unremarkable. No portal venous gas. There is no adenopathy. Reproductive: The prostate and seminal vesicles are grossly unremarkable. Other: Midline vertical anterior abdominal wall incisional scar. Musculoskeletal: No acute or significant osseous findings. IMPRESSION: 1. No acute intra-abdominopelvic pathology. 2. Mild nonspecific perinephric stranding. Correlation with urinalysis recommended to exclude UTI. 3. Postsurgical changes of colectomy. No bowel obstruction. Electronically Signed   By: Anner Crete M.D.   On: 01/17/2020 17:53   DG Chest 2 View  Result Date: 01/17/2020 CLINICAL DATA:  59 year old male with sepsis. EXAM: CHEST - 2 VIEW COMPARISON:  Chest radiograph dated 12/25/2004. FINDINGS: Increased density over the posterior lower lobes, seen on the lateral view, may be artifactual and related to superimposition of the soft tissues or atelectasis. Infiltrate is less likely but not excluded. Clinical correlation is recommended. No lobar consolidation, pleural effusion, or pneumothorax. Top-normal cardiac silhouette. No acute osseous pathology. IMPRESSION: Atelectasis  versus developing infiltrate involving the posterior lower lobes. Clinical correlation recommended. Electronically Signed   By: Anner Crete M.D.   On: 01/17/2020 16:05       Subjective: Patient seen and examined at bedside today.  Hemodynamically stable for discharge.  Discharge Exam: Vitals:   01/23/20 2020 01/24/20 0427  BP: (!) 148/98 (!) 129/94  Pulse: 93 73  Resp: 20 19  Temp: 98.2 F (36.8 C) 98.4 F (36.9 C)  SpO2: 100% 100%   Vitals:   01/23/20 1353 01/23/20 1654 01/23/20 2020 01/24/20 0427  BP: (!) 138/103 (!) 132/95 (!) 148/98 (!) 129/94  Pulse: 87 78 93 73  Resp: _0 Temp: 98 F (36.7 C)  98.2 F (36.8 C) 98.4 F (36.9 C)  TempSrc: Oral  Oral Oral  SpO2: 99% 98% 100% 100%  Weight:      Height:        General: Pt is alert,  awake, not in acute distress Cardiovascular: RRR, S1/S2 +, no rubs, no gallops Respiratory: CTA bilaterally, no wheezing, no rhonchi Abdominal: Soft, NT, ND, bowel sounds + Extremities: no edema, no cyanosis    The results of significant diagnostics from this hospitalization (including imaging, microbiology, ancillary and laboratory) are listed below for reference.     Microbiology: Recent Results (from the past 240 hour(s))  C Difficile Quick Screen w PCR reflex     Status: None   Collection Time: 01/17/20 12:54 PM   Specimen: STOOL  Result Value Ref Range Status   C Diff antigen NEGATIVE NEGATIVE Final   C Diff toxin NEGATIVE NEGATIVE Final   C Diff interpretation No C. difficile detected.  Final    Comment: Performed at [facility], [place] [address] [place], [place] [address]  Blood Culture (routine x 2)     Status: Abnormal   Collection Time: 01/17/20  3:48 PM   Specimen: BLOOD RIGHT HAND  Result Value Ref Range Status   Specimen Description   Final    BLOOD RIGHT HAND Performed at [facility] [address] [place], [place] [address]    Special Requests   Final    BOTTLES DRAWN AEROBIC AND ANAEROBIC Blood Culture adequate volume Performed at [facility] [address], [place], [place] [address]    Culture  Setup Time   Final    GRAM NEGATIVE RODS ANAEROBIC BOTTLE ONLY Organism ID to follow CRITICAL RESULT CALLED TO, READ BACK BY AND VERIFIED WITH: D. Wofford PharmD 9:15 01/18/20 ([email or web address])    Culture (A)  Final    ESCHERICHIA COLI SUSCEPTIBILITIES PERFORMED ON PREVIOUS CULTURE WITHIN THE LAST 5 DAYS. Performed at [facility], [place] [address] [place], [place] [address]    Report Status 01/20/2020 FINAL  Final  Blood Culture (routine x 2)     Status: Abnormal   Collection Time: 01/17/20  3:48 PM   Specimen: BLOOD LEFT HAND  Result Value Ref Range Status   Specimen Description   Final    BLOOD LEFT  HAND Performed at [facility] [address], [place], [place] [address]    Special Requests   Final    BOTTLES DRAWN AEROBIC AND ANAEROBIC Blood Culture adequate volume Performed at [facility] [address] [place], [place] [address]    Culture  Setup Time   Final    GRAM NEGATIVE RODS IN BOTH AEROBIC AND ANAEROBIC BOTTLES CRITICAL VALUE NOTED.  VALUE IS CONSISTENT  WITH PREVIOUSLY REPORTED AND CALLED VALUE. Performed at [facility], [place] [address], [place], [place] [address]    Culture (A)  Final    ESCHERICHIA COLI Confirmed Extended Spectrum Beta-Lactamase Producer (ESBL).  In bloodstream infections from ESBL organisms, carbapenems are preferred over piperacillin/tazobactam. They are shown to have a lower risk of mortality.    Report Status 01/20/2020 FINAL  Final   Organism ID, Bacteria ESCHERICHIA COLI  Final      Susceptibility   Escherichia coli - MIC*    AMPICILLIN >=32 RESISTANT Resistant     CEFAZOLIN >=64 RESISTANT Resistant     CEFEPIME >=32 RESISTANT Resistant     CEFTAZIDIME >=64 RESISTANT Resistant     CEFTRIAXONE >=64 RESISTANT Resistant     CIPROFLOXACIN >=4 RESISTANT Resistant     GENTAMICIN <=1 SENSITIVE Sensitive     IMIPENEM <=0.25 SENSITIVE Sensitive     TRIMETH/SULFA >=320 RESISTANT Resistant     AMPICILLIN/SULBACTAM >=32 RESISTANT Resistant     PIP/TAZO 8 SENSITIVE Sensitive     * ESCHERICHIA COLI  Urine culture     Status: Abnormal   Collection Time: 01/17/20  3:48 PM   Specimen: In/Out Cath Urine  Result Value Ref Range Status   Specimen Description   Final    IN/OUT CATH URINE Performed at [facility] [address], [place], [place] [address]    Special Requests   Final    NONE Performed at [facility], [place] [address], [place], [place] [address]    Culture (A)  Final    >=100,000 COLONIES/mL ESCHERICHIA COLI Confirmed Extended Spectrum Beta-Lactamase  Producer (ESBL).  In bloodstream infections from ESBL organisms, carbapenems are preferred over piperacillin/tazobactam. They are shown to have a lower risk of mortality.    Report Status 01/19/2020 FINAL  Final   Organism ID, Bacteria ESCHERICHIA COLI (A)  Final      Susceptibility   Escherichia coli - MIC*    AMPICILLIN >=32 RESISTANT Resistant     CEFAZOLIN >=64 RESISTANT Resistant     CEFTRIAXONE >=64 RESISTANT Resistant     CIPROFLOXACIN >=4 RESISTANT Resistant     GENTAMICIN <=1 SENSITIVE Sensitive     IMIPENEM <=0.25 SENSITIVE Sensitive     NITROFURANTOIN 32 SENSITIVE Sensitive     TRIMETH/SULFA >=320 RESISTANT Resistant     AMPICILLIN/SULBACTAM >=32 RESISTANT Resistant     PIP/TAZO 8 SENSITIVE Sensitive     * >=100,000 COLONIES/mL ESCHERICHIA COLI  Blood Culture ID Panel (Reflexed)     Status: Abnormal   Collection Time: 01/17/20  3:48 PM  Result Value Ref Range Status   Enterococcus faecalis NOT DETECTED NOT DETECTED Final   Enterococcus Faecium NOT DETECTED NOT DETECTED Final   Listeria monocytogenes NOT DETECTED NOT DETECTED Final   Staphylococcus species NOT DETECTED NOT DETECTED Final   Staphylococcus aureus (BCID) NOT DETECTED NOT DETECTED Final   Staphylococcus epidermidis NOT DETECTED NOT DETECTED Final   Staphylococcus lugdunensis NOT DETECTED NOT DETECTED Final   Streptococcus species NOT DETECTED NOT DETECTED Final   Streptococcus agalactiae NOT DETECTED NOT DETECTED Final   Streptococcus pneumoniae NOT DETECTED NOT DETECTED Final   Streptococcus pyogenes NOT DETECTED NOT DETECTED Final   A.calcoaceticus-baumannii NOT DETECTED NOT DETECTED Final   Bacteroides fragilis NOT DETECTED NOT DETECTED Final   Enterobacterales DETECTED (A) NOT DETECTED Final    Comment: Enterobacterales represent a large order of gram negative bacteria, not a single organism.   Enterobacter cloacae complex NOT DETECTED  NOT DETECTED Final   Escherichia coli DETECTED (A) NOT DETECTED Final     Comment: CRITICAL RESULT CALLED TO, READ BACK BY AND VERIFIED WITH: D. Wofford PharmD 9:15 01/18/20 ([email or web address])    Klebsiella aerogenes NOT DETECTED NOT DETECTED Final   Klebsiella oxytoca NOT DETECTED NOT DETECTED Final   Klebsiella pneumoniae NOT DETECTED NOT DETECTED Final   Proteus species NOT DETECTED NOT DETECTED Final   Salmonella species NOT DETECTED NOT DETECTED Final   Serratia marcescens NOT DETECTED NOT DETECTED Final   Haemophilus influenzae NOT DETECTED NOT DETECTED Final   Neisseria meningitidis NOT DETECTED NOT DETECTED Final   Pseudomonas aeruginosa NOT DETECTED NOT DETECTED Final   Stenotrophomonas maltophilia NOT DETECTED NOT DETECTED Final   Candida albicans NOT DETECTED NOT DETECTED Final   Candida auris NOT DETECTED NOT DETECTED Final   Candida glabrata NOT DETECTED NOT DETECTED Final   Candida krusei NOT DETECTED NOT DETECTED Final   Candida parapsilosis NOT DETECTED NOT DETECTED Final   Candida tropicalis NOT DETECTED NOT DETECTED Final   Cryptococcus neoformans/gattii NOT DETECTED NOT DETECTED Final   CTX-M ESBL DETECTED (A) NOT DETECTED Final    Comment: CRITICAL RESULT CALLED TO, READ BACK BY AND VERIFIED WITH: D. Wofford PharmD 9:15 01/18/20 ([email or web address]) (NOTE) Extended spectrum beta-lactamase detected. Recommend a carbapenem as initial therapy.      Carbapenem resistance IMP NOT DETECTED NOT DETECTED Final   Carbapenem resistance KPC NOT DETECTED NOT DETECTED Final   Carbapenem resistance NDM NOT DETECTED NOT DETECTED Final   Carbapenem resist OXA 48 LIKE NOT DETECTED NOT DETECTED Final   Carbapenem resistance VIM NOT DETECTED NOT DETECTED Final    Comment: Performed at [facility], [place] [address], [place], [place] [address]  SARS Coronavirus 2 by RT PCR (hospital order, performed in [facility] hospital lab) Nasopharyngeal Nasopharyngeal Swab     Status: None   Collection Time: 01/17/20  6:40 PM   Specimen: Nasopharyngeal Swab  Result Value Ref  Range Status   SARS Coronavirus 2 NEGATIVE NEGATIVE Final    Comment: (NOTE) SARS-CoV-2 target nucleic acids are NOT DETECTED.  The SARS-CoV-2 RNA is generally detectable in upper and lower respiratory specimens during the acute phase of infection. The lowest concentration of SARS-CoV-2 viral copies this assay can detect is 250 copies / mL. A negative result does not preclude SARS-CoV-2 infection and should not be used as the sole basis for treatment or other patient management decisions.  A negative result may occur with improper specimen collection / handling, submission of specimen other than nasopharyngeal swab, presence of viral mutation(s) within the areas targeted by this assay, and inadequate number of viral copies (<250 copies / mL). A negative result must be combined with clinical observations, patient history, and epidemiological information.  Fact Sheet for Patients:   StrictlyIdeas.no  Fact Sheet for Healthcare Providers: BankingDealers.co.za  This test is not yet approved or  cleared by the [place] FDA and has been authorized for detection and/or diagnosis of SARS-CoV-2 by FDA under an Emergency Use Authorization (EUA).  This EUA will remain in effect (meaning this test can be used) for the duration of the COVID-19 declaration under Section 564(b)(1) of the Act, 21 U.S.C. section 360bbb-3(b)(1), unless the authorization is terminated or revoked sooner.  Performed at [facility], [place] [address], [place], [place] [address]   MRSA PCR Screening     Status: Abnormal   Collection Time: 01/19/20  7:46 AM   Specimen: Nasal Mucosa;  Nasopharyngeal  Result Value Ref Range Status   MRSA by PCR POSITIVE (A) NEGATIVE Final    Comment:        The GeneXpert MRSA Assay (FDA approved for NASAL specimens only), is one component of a comprehensive MRSA colonization surveillance program. It is not intended  to diagnose MRSA infection nor to guide or monitor treatment for MRSA infections. RESULT CALLED TO, READ BACK BY AND VERIFIED WITH: H.SEGERS AT 1341 ON 01/19/20 BY N.THOMPSON Performed at [facility], [place] [address] [place], [place] [address]   Culture, blood (routine x 2)     Status: None (Preliminary result)   Collection Time: 01/19/20 10:19 AM   Specimen: BLOOD RIGHT HAND  Result Value Ref Range Status   Specimen Description   Final    BLOOD RIGHT HAND Performed at [facility] [address], [place], [place] [address]    Special Requests   Final    BOTTLES DRAWN AEROBIC ONLY Blood Culture results may not be optimal due to an inadequate volume of blood received in culture bottles Performed at [facility] [address] [place], [place] [address]    Culture   Final    NO GROWTH 4 DAYS Performed at [facility], [place] [address] [place], [place] [address]    Report Status PENDING  Incomplete  Culture, blood (routine x 2)     Status: None (Preliminary result)   Collection Time: 01/19/20 10:19 AM   Specimen: BLOOD LEFT HAND  Result Value Ref Range Status   Specimen Description   Final    BLOOD LEFT HAND Performed at [facility] [address], [place], [place] [address]    Special Requests   Final    BOTTLES DRAWN AEROBIC AND ANAEROBIC Blood Culture adequate volume Performed at [facility] [address], [place], [place] [address]    Culture   Final    NO GROWTH 4 DAYS Performed at [facility], [place] [address] [place], [place] [address]    Report Status PENDING  Incomplete     Labs: BNP (last 3 results) No results for input(s): BNP in the last 8760 hours. Basic Metabolic Panel: Recent Labs  Lab 01/19/20 0223 01/19/20 0223 01/20/20 0222 01/21/20 0217 01/22/20 0528 01/23/20 0528 01/24/20 0552  NA 138   < > 142 139 139 140 141  K 3.9   < > 4.0 3.8  3.7 4.0 3.7  CL 110   < > 110 107 106 106 103  CO2 22   < > _0 GLUCOSE 282*   < > 235* 142* 221* 122* 145*  BUN 18   < > _1 CREATININE 1.51*   < > 1.52* 1.42* 1.33* 1.34* 1.08  CALCIUM 9.3   < > 9.9 9.9 9.6 9.9 9.7  MG 2.5*   < > 2.2 2.1 2.0 1.9 2.0  PHOS 3.2  --  2.0* 2.9 3.2 3.0  --    < > = values in this interval not displayed.   Liver Function Tests: Recent Labs  Lab 01/17/20 1547  AST 77*  ALT 64*  ALKPHOS 83  BILITOT 1.5*  PROT 7.1  ALBUMIN 3.9   Recent Labs  Lab 01/17/20 1548  LIPASE 34   No results for input(s): AMMONIA in the last 168 hours. CBC: Recent Labs  Lab 01/20/20 0222 01/21/20 0217 01/22/20 0601 01/23/20 5615  01/24/20 0552  WBC 9.3 6.4 5.7 6.9 7.6  NEUTROABS 7.0 4.5 3.7 4.5 5.0  HGB 12.0* 11.4* 10.3* 10.6* 10.4*  HCT 39.5 36.9* 32.9* 34.0* 33.6*  MCV 101.3* 100.0 98.2 98.0 97.7  PLT 129* 141* 150 161 185   Cardiac Enzymes: No results for input(s): CKTOTAL, CKMB, CKMBINDEX, TROPONINI in the last 168 hours. BNP: Invalid input(s): POCBNP CBG: No results for input(s): GLUCAP in the last 168 hours. D-Dimer No results for input(s): DDIMER in the last 72 hours. Hgb A1c No results for input(s): HGBA1C in the last 72 hours. Lipid Profile No results for input(s): CHOL, HDL, LDLCALC, TRIG, CHOLHDL, LDLDIRECT in the last 72 hours. Thyroid function studies No results for input(s): TSH, T4TOTAL, T3FREE, THYROIDAB in the last 72 hours.  Invalid input(s): FREET3 Anemia work up No results for input(s): VITAMINB12, FOLATE, FERRITIN, TIBC, IRON, RETICCTPCT in the last 72 hours. Urinalysis    Component Value Date/Time   COLORURINE YELLOW 01/17/2020 1547   APPEARANCEUR CLEAR 01/17/2020 1547   LABSPEC 1.006 01/17/2020 1547   PHURINE 5.0 01/17/2020 1547   GLUCOSEU NEGATIVE 01/17/2020 1547   HGBUR LARGE (A) 01/17/2020 1547   BILIRUBINUR NEGATIVE 01/17/2020 1547   KETONESUR NEGATIVE 01/17/2020 1547   PROTEINUR 30 (A)  01/17/2020 1547   UROBILINOGEN 0.2 01/29/2012 1733   NITRITE NEGATIVE 01/17/2020 1547   LEUKOCYTESUR MODERATE (A) 01/17/2020 1547   Sepsis Labs Invalid input(s): PROCALCITONIN,  WBC,  LACTICIDVEN Microbiology Recent Results (from the past 240 hour(s))  C Difficile Quick Screen w PCR reflex     Status: None   Collection Time: 01/17/20 12:54 PM   Specimen: STOOL  Result Value Ref Range Status   C Diff antigen NEGATIVE NEGATIVE Final   C Diff toxin NEGATIVE NEGATIVE Final   C Diff interpretation No C. difficile detected.  Final    Comment: Performed at [facility], [place] [address], [place], [place] [address]  Blood Culture (routine x 2)     Status: Abnormal   Collection Time: 01/17/20  3:48 PM   Specimen: BLOOD RIGHT HAND  Result Value Ref Range Status   Specimen Description   Final    BLOOD RIGHT HAND Performed at [facility] [address] [place], [place] [address]    Special Requests   Final    BOTTLES DRAWN AEROBIC AND ANAEROBIC Blood Culture adequate volume Performed at [facility] [address], [place], [place] [address]    Culture  Setup Time   Final    GRAM NEGATIVE RODS ANAEROBIC BOTTLE ONLY Organism ID to follow CRITICAL RESULT CALLED TO, READ BACK BY AND VERIFIED WITH: D. Wofford PharmD 9:15 01/18/20 ([email or web address])    Culture (A)  Final    ESCHERICHIA COLI SUSCEPTIBILITIES PERFORMED ON PREVIOUS CULTURE WITHIN THE LAST 5 DAYS. Performed at [facility], [place] [address] [place], [place] [address]    Report Status 01/20/2020 FINAL  Final  Blood Culture (routine x 2)     Status: Abnormal   Collection Time: 01/17/20  3:48 PM   Specimen: BLOOD LEFT HAND  Result Value Ref Range Status   Specimen Description   Final    BLOOD LEFT HAND Performed at [facility] [address], [place], [place] [address]    Special Requests   Final    BOTTLES DRAWN AEROBIC AND ANAEROBIC Blood Culture  adequate volume Performed at [facility] [address], [place], [place] [address]  Culture  Setup Time   Final    GRAM NEGATIVE RODS IN BOTH AEROBIC AND ANAEROBIC BOTTLES CRITICAL VALUE NOTED.  VALUE IS CONSISTENT WITH PREVIOUSLY REPORTED AND CALLED VALUE. Performed at [facility], [place] [address], [place], [place] [address]    Culture (A)  Final    ESCHERICHIA COLI Confirmed Extended Spectrum Beta-Lactamase Producer (ESBL).  In bloodstream infections from ESBL organisms, carbapenems are preferred over piperacillin/tazobactam. They are shown to have a lower risk of mortality.    Report Status 01/20/2020 FINAL  Final   Organism ID, Bacteria ESCHERICHIA COLI  Final      Susceptibility   Escherichia coli - MIC*    AMPICILLIN >=32 RESISTANT Resistant     CEFAZOLIN >=64 RESISTANT Resistant     CEFEPIME >=32 RESISTANT Resistant     CEFTAZIDIME >=64 RESISTANT Resistant     CEFTRIAXONE >=64 RESISTANT Resistant     CIPROFLOXACIN >=4 RESISTANT Resistant     GENTAMICIN <=1 SENSITIVE Sensitive     IMIPENEM <=0.25 SENSITIVE Sensitive     TRIMETH/SULFA >=320 RESISTANT Resistant     AMPICILLIN/SULBACTAM >=32 RESISTANT Resistant     PIP/TAZO 8 SENSITIVE Sensitive     * ESCHERICHIA COLI  Urine culture     Status: Abnormal   Collection Time: 01/17/20  3:48 PM   Specimen: In/Out Cath Urine  Result Value Ref Range Status   Specimen Description   Final    IN/OUT CATH URINE Performed at [facility] [address], [place], [place] [address]    Special Requests   Final    NONE Performed at [facility], [place] [address], [place],  [address]    Culture (A)  Final    >=100,000 COLONIES/mL ESCHERICHIA COLI Confirmed Extended Spectrum Beta-Lactamase Producer (ESBL).  In bloodstream infections from ESBL organisms, carbapenems are preferred over piperacillin/tazobactam. They are shown to have a lower risk of mortality.     Report Status 01/19/2020 FINAL  Final   Organism ID, Bacteria ESCHERICHIA COLI (A)  Final      Susceptibility   Escherichia coli - MIC*    AMPICILLIN >=32 RESISTANT Resistant     CEFAZOLIN >=64 RESISTANT Resistant     CEFTRIAXONE >=64 RESISTANT Resistant     CIPROFLOXACIN >=4 RESISTANT Resistant     GENTAMICIN <=1 SENSITIVE Sensitive     IMIPENEM <=0.25 SENSITIVE Sensitive     NITROFURANTOIN 32 SENSITIVE Sensitive     TRIMETH/SULFA >=320 RESISTANT Resistant     AMPICILLIN/SULBACTAM >=32 RESISTANT Resistant     PIP/TAZO 8 SENSITIVE Sensitive     * >=100,000 COLONIES/mL ESCHERICHIA COLI  Blood Culture ID Panel (Reflexed)     Status: Abnormal   Collection Time: 01/17/20  3:48 PM  Result Value Ref Range Status   Enterococcus faecalis NOT DETECTED NOT DETECTED Final   Enterococcus Faecium NOT DETECTED NOT DETECTED Final   Listeria monocytogenes NOT DETECTED NOT DETECTED Final   Staphylococcus species NOT DETECTED NOT DETECTED Final   Staphylococcus aureus (BCID) NOT DETECTED NOT DETECTED Final   Staphylococcus epidermidis NOT DETECTED NOT DETECTED Final   Staphylococcus lugdunensis NOT DETECTED NOT DETECTED Final   Streptococcus species NOT DETECTED NOT DETECTED Final   Streptococcus agalactiae NOT DETECTED NOT DETECTED Final   Streptococcus pneumoniae NOT DETECTED NOT DETECTED Final   Streptococcus pyogenes NOT DETECTED NOT DETECTED Final   A.calcoaceticus-baumannii NOT DETECTED NOT DETECTED Final   Bacteroides fragilis NOT DETECTED NOT DETECTED Final   Enterobacterales DETECTED (A) NOT  DETECTED Final    Comment: Enterobacterales represent a large order of gram negative bacteria, not a single organism.   Enterobacter cloacae complex NOT DETECTED NOT DETECTED Final   Escherichia coli DETECTED (A) NOT DETECTED Final    Comment: CRITICAL RESULT CALLED TO, READ BACK BY AND VERIFIED WITH: D. Wofford PharmD 9:15 01/18/20 ([email or web address])    Klebsiella aerogenes NOT DETECTED NOT DETECTED Final    Klebsiella oxytoca NOT DETECTED NOT DETECTED Final   Klebsiella pneumoniae NOT DETECTED NOT DETECTED Final   Proteus species NOT DETECTED NOT DETECTED Final   Salmonella species NOT DETECTED NOT DETECTED Final   Serratia marcescens NOT DETECTED NOT DETECTED Final   Haemophilus influenzae NOT DETECTED NOT DETECTED Final   Neisseria meningitidis NOT DETECTED NOT DETECTED Final   Pseudomonas aeruginosa NOT DETECTED NOT DETECTED Final   Stenotrophomonas maltophilia NOT DETECTED NOT DETECTED Final   Candida albicans NOT DETECTED NOT DETECTED Final   Candida auris NOT DETECTED NOT DETECTED Final   Candida glabrata NOT DETECTED NOT DETECTED Final   Candida krusei NOT DETECTED NOT DETECTED Final   Candida parapsilosis NOT DETECTED NOT DETECTED Final   Candida tropicalis NOT DETECTED NOT DETECTED Final   Cryptococcus neoformans/gattii NOT DETECTED NOT DETECTED Final   CTX-M ESBL DETECTED (A) NOT DETECTED Final    Comment: CRITICAL RESULT CALLED TO, READ BACK BY AND VERIFIED WITH: D. Wofford PharmD 9:15 01/18/20 ([email or web address]) (NOTE) Extended spectrum beta-lactamase detected. Recommend a carbapenem as initial therapy.      Carbapenem resistance IMP NOT DETECTED NOT DETECTED Final   Carbapenem resistance KPC NOT DETECTED NOT DETECTED Final   Carbapenem resistance NDM NOT DETECTED NOT DETECTED Final   Carbapenem resist OXA 48 LIKE NOT DETECTED NOT DETECTED Final   Carbapenem resistance VIM NOT DETECTED NOT DETECTED Final    Comment: Performed at [facility], [place] [address], [place], [place] [address]  SARS Coronavirus 2 by RT PCR (hospital order, performed in [facility] hospital lab) Nasopharyngeal Nasopharyngeal Swab     Status: None   Collection Time: 01/17/20  6:40 PM   Specimen: Nasopharyngeal Swab  Result Value Ref Range Status   SARS Coronavirus 2 NEGATIVE NEGATIVE Final    Comment: (NOTE) SARS-CoV-2 target nucleic acids are NOT DETECTED.  The SARS-CoV-2 RNA is generally detectable  in upper and lower respiratory specimens during the acute phase of infection. The lowest concentration of SARS-CoV-2 viral copies this assay can detect is 250 copies / mL. A negative result does not preclude SARS-CoV-2 infection and should not be used as the sole basis for treatment or other patient management decisions.  A negative result may occur with improper specimen collection / handling, submission of specimen other than nasopharyngeal swab, presence of viral mutation(s) within the areas targeted by this assay, and inadequate number of viral copies (<250 copies / mL). A negative result must be combined with clinical observations, patient history, and epidemiological information.  Fact Sheet for Patients:   StrictlyIdeas.no  Fact Sheet for Healthcare Providers: BankingDealers.co.za  This test is not yet approved or  cleared by the [place] FDA and has been authorized for detection and/or diagnosis of SARS-CoV-2 by FDA under an Emergency Use Authorization (EUA).  This EUA will remain in effect (meaning this test can be used) for the duration of the COVID-19 declaration under Section 564(b)(1) of the Act, 21 U.S.C. section 360bbb-3(b)(1), unless the authorization is terminated or revoked sooner.  Performed at [facility], [address] [address] [place],  [place] [address]   MRSA PCR Screening     Status: Abnormal   Collection Time: 01/19/20  7:46 AM   Specimen: Nasal Mucosa; Nasopharyngeal  Result Value Ref Range Status   MRSA by PCR POSITIVE (A) NEGATIVE Final    Comment:        The GeneXpert MRSA Assay (FDA approved for NASAL specimens only), is one component of a comprehensive MRSA colonization surveillance program. It is not intended to diagnose MRSA infection nor to guide or monitor treatment for MRSA infections. RESULT CALLED TO, READ BACK BY AND VERIFIED WITH: H.SEGERS AT 1341 ON 01/19/20 BY  N.THOMPSON Performed at [facility], [place] [address], [place], [place] [address]   Culture, blood (routine x 2)     Status: None (Preliminary result)   Collection Time: 01/19/20 10:19 AM   Specimen: BLOOD RIGHT HAND  Result Value Ref Range Status   Specimen Description   Final    BLOOD RIGHT HAND Performed at [facility] [address], [place], [place] [address]    Special Requests   Final    BOTTLES DRAWN AEROBIC ONLY Blood Culture results may not be optimal due to an inadequate volume of blood received in culture bottles Performed at [facility] [address] [place], [place] [address]    Culture   Final    NO GROWTH 4 DAYS Performed at [facility], [place] [address] [place], [place] [address]    Report Status PENDING  Incomplete  Culture, blood (routine x 2)     Status: None (Preliminary result)   Collection Time: 01/19/20 10:19 AM   Specimen: BLOOD LEFT HAND  Result Value Ref Range Status   Specimen Description   Final    BLOOD LEFT HAND Performed at [facility] [address] [place], [place] [address]    Special Requests   Final    BOTTLES DRAWN AEROBIC AND ANAEROBIC Blood Culture adequate volume Performed at [facility] [address], [place], [place] [address]    Culture   Final    NO GROWTH 4 DAYS Performed at [facility], [place] [address], [place], [place] [address]    Report Status PENDING  Incomplete    Please note: You were cared for by a hospitalist during your hospital stay. Once you are discharged, your primary care physician will handle any further medical issues. Please note that NO REFILLS for any discharge medications will be authorized once you are discharged, as it is imperative that you return to your primary care physician (or establish a relationship with a primary care physician if you do not have one) for your post hospital discharge  needs so that they can reassess your need for medications and monitor your lab values.    Time coordinating discharge: 40 minutes  SIGNED:   Shelly Coss, MD  [facility] 01/24/2020, 8:38 AM Pager [phone]  If 7PM-7AM, please contact night-coverage [email or web address] Password [email or web address]

## 2020-01-24 NOTE — Progress Notes (Signed)
Pt discharged to home in stable, ambulatory condition with no complaints or questions. Verbalized understanding of the AVS instructions using teach back.  Coolidge Breeze, RN 01/24/2020

## 2020-02-02 ENCOUNTER — Ambulatory Visit: Payer: Self-pay | Admitting: General Surgery

## 2020-02-02 NOTE — H&P (Signed)
History of Present Illness Ralene Ok MD; 02/02/2020 10:45 AM) The patient is a 59 year old male who presents with an inguinal hernia. Chief Complaint: Right inguinal hernia  Patient is a 59 year old male who comes in secondary to a right inguinal hernia. Patient states he was recently hospitalized secondary to ESBL infection. Patient states he had a bout of A. fib in the hospital. He states he did have some convulsions that time and potentially could've caused his hernia. He states that he's had some tingling and numbness to the right inguinal area as well as right medial thigh. He does notice a bulge in the right inguinal area.  He's had a previous open left inguinal hernia repair in the past that failed. Subsequently was fix robotically. Patient also had multiple midline laparotomy secondary to his ulcerative colitis.  Patient sees Dr. Ayesha Rumpf cardiologist.   Past Surgical History Emeline Gins, [place]; 02/02/2020 10:35 AM) Laparoscopic Inguinal Hernia Surgery  Left. Open Inguinal Hernia Surgery  Left. Resection of Small Bowel   Diagnostic Studies History Emeline Gins, [place]; 02/02/2020 10:35 AM) Colonoscopy  1-5 years ago  Allergies Emeline Gins, CMA; 02/02/2020 10:37 AM) Penicillins  Sulfa Drugs  Asacol *GASTROINTESTINAL AGENTS - MISC.*  Iodinated Contrast Media  BEE VENOM  Allergies Reconciled   Medication History Emeline Gins, CMA; 02/02/2020 10:38 AM) Aspirin (81MG Tablet, Oral) Active. Atenolol (25MG Tablet, Oral) Active. Ezetimibe (10MG Tablet, Oral) Active. predniSONE (10MG Tablet, Oral) Active. Rosuvastatin Calcium (10MG Tablet, Oral) Active. Gabapentin (300MG Capsule, Oral) Active. hydrALAZINE HCl (50MG Tablet, Oral) Active. Dicyclomine HCl (10MG Capsule, Oral) Active. Ciprofloxacin HCl (500MG Tablet, Oral) Active. Glucosamine (500MG Tablet, Oral) Active. Budesonide (3MG Capsule DR Part, Oral) Active. D3-50 (1.25  MG(50000 UT) Capsule, Oral) Active. amLODIPine Besylate (10MG Tablet, Oral) Active. traMADol HCl (50MG Tablet, Oral) Active. Medications Reconciled  Other Problems Emeline Gins, CMA; 02/02/2020 10:35 AM) Arthritis  Atrial Fibrillation  Back Pain  Depression  Gastroesophageal Reflux Disease  Heart murmur  Hemorrhoids  Hypercholesterolemia  Inguinal Hernia  Ulcerative Colitis     Review of Systems Ralene Ok MD; 02/02/2020 10:44 AM) General Not Present- Appetite Loss, Chills, Fatigue, Fever, Night Sweats, Weight Gain and Weight Loss. Skin Present- Rash. Not Present- Change in Wart/Mole, Dryness, Hives, Jaundice, New Lesions, Non-Healing Wounds and Ulcer. HEENT Present- Hoarseness, Seasonal Allergies and Wears glasses/contact lenses. Not Present- Earache, Hearing Loss, Nose Bleed, Oral Ulcers, Ringing in the Ears, Sinus Pain, Sore Throat, Visual Disturbances and Yellow Eyes. Cardiovascular Present- Shortness of Breath. Not Present- Chest Pain, Difficulty Breathing Lying Down, Leg Cramps, Palpitations, Rapid Heart Rate and Swelling of Extremities. Gastrointestinal Present- Abdominal Pain, Bloating, Bloody Stool, Excessive gas and Nausea. Not Present- Change in Bowel Habits, Chronic diarrhea, Constipation, Difficulty Swallowing, Gets full quickly at meals, Hemorrhoids, Indigestion, Rectal Pain and Vomiting. Musculoskeletal Present- Back Pain and Muscle Weakness. Not Present- Joint Pain, Joint Stiffness, Muscle Pain and Swelling of Extremities. Neurological Present- Numbness, Tingling and Weakness. Not Present- Decreased Memory, Fainting, Headaches, Seizures, Tremor and Trouble walking. Psychiatric Present- Change in Sleep Pattern and Depression. Not Present- Anxiety, Bipolar, Fearful and Frequent crying. All other systems negative  Vitals Emeline Gins CMA; 02/02/2020 10:35 AM) 02/02/2020 10:35 AM Weight: 226.4 lb Height: 71in Body Surface Area: 2.22 m Body  Mass Index: 31.58 kg/m  Temp.: 97.30F  Pulse: 105 (Regular)  BP: 138/82(Sitting, Left Arm, Standard)       Physical Exam Ralene Ok MD; 02/02/2020 10:46 AM) The physical exam findings are as follows: Note: Constitutional: No acute distress,  conversant, appears stated age  Eyes: Anicteric sclerae, moist conjunctiva, no lid lag  Neck: No thyromegaly, trachea midline, no cervical lymphadenopathy  Lungs: Clear to auscultation biilaterally, normal respiratory effot  Cardiovascular: regular rate & rhythm, no murmurs, no peripheal edema, pedal pulses 2+  GI: Soft, no masses or hepatosplenomegaly, non-tender to palpation  MSK: Normal gait, no clubbing cyanosis, edema  Skin: No rashes, palpation reveals normal skin turgor  Psychiatric: Appropriate judgment and insight, oriented to person, place, and time  Abdomen Inspection Hernias - Right - Inguinal hernia - Reducible - Right.    Assessment & Plan Ralene Ok MD; 02/02/2020 10:46 AM) RIGHT INGUINAL HERNIA (K40.90) Impression: Patient is a 59 year old male with a right inguinal hernia, history of ulcerative colitis on steroids, has had multiple previous abdominal surgeries  1. The patient will like to proceed to the operating room for open right inguinal hernia repair with mesh.  2. I discussed with the patient the signs and symptoms of incarceration and strangulation and the need to proceed to the ER should they occur.  3. I discussed with the patient the risks and benefits of the procedure to include but not limited to: Infection, bleeding, damage to surrounding structures, possible need for further surgery, possible nerve pain, and possible recurrence. The patient was understanding and wishes to proceed.

## 2020-02-19 ENCOUNTER — Emergency Department (HOSPITAL_COMMUNITY): Payer: 59

## 2020-02-19 ENCOUNTER — Other Ambulatory Visit: Payer: Self-pay

## 2020-02-19 ENCOUNTER — Emergency Department (HOSPITAL_COMMUNITY)
Admission: EM | Admit: 2020-02-19 | Discharge: 2020-02-19 | Disposition: A | Discharge status: Home / Self Care | Payer: 59 | Attending: Emergency Medicine | Admitting: Emergency Medicine

## 2020-02-19 DIAGNOSIS — R350 Frequency of micturition: Secondary | ICD-10-CM | POA: Diagnosis not present

## 2020-02-19 DIAGNOSIS — E860 Dehydration: Secondary | ICD-10-CM

## 2020-02-19 DIAGNOSIS — Z79899 Other long term (current) drug therapy: Secondary | ICD-10-CM | POA: Insufficient documentation

## 2020-02-19 DIAGNOSIS — R739 Hyperglycemia, unspecified: Secondary | ICD-10-CM | POA: Diagnosis not present

## 2020-02-19 DIAGNOSIS — I10 Essential (primary) hypertension: Secondary | ICD-10-CM | POA: Insufficient documentation

## 2020-02-19 DIAGNOSIS — R5383 Other fatigue: Secondary | ICD-10-CM | POA: Diagnosis present

## 2020-02-19 LAB — COMPREHENSIVE METABOLIC PANEL
ALT: 28 U/L (ref 0–44)
AST: 24 U/L (ref 15–41)
Albumin: 4.5 g/dL (ref 3.5–5.0)
Alkaline Phosphatase: 68 U/L (ref 38–126)
Anion gap: 15 (ref 5–15)
BUN: 21 mg/dL — ABNORMAL HIGH (ref 6–20)
CO2: 22 mmol/L (ref 22–32)
Calcium: 10.1 mg/dL (ref 8.9–10.3)
Chloride: 96 mmol/L — ABNORMAL LOW (ref 98–111)
Creatinine, Ser: 1.66 mg/dL — ABNORMAL HIGH (ref 0.61–1.24)
GFR calc Af Amer: 53 mL/min — ABNORMAL LOW (ref >60)
GFR calc non Af Amer: 45 mL/min — ABNORMAL LOW (ref >60)
Glucose, Bld: 477 mg/dL — ABNORMAL HIGH (ref 70–99)
Potassium: 3.8 mmol/L (ref 3.5–5.1)
Sodium: 133 mmol/L — ABNORMAL LOW (ref 135–145)
Total Bilirubin: 1.2 mg/dL (ref 0.3–1.2)
Total Protein: 8.1 g/dL (ref 6.5–8.1)

## 2020-02-19 LAB — CBG MONITORING, ED
Glucose-Capillary: 456 mg/dL — ABNORMAL HIGH (ref 70–99)
Glucose-Capillary: 303 mg/dL — ABNORMAL HIGH (ref 70–99)
Glucose-Capillary: 222 mg/dL — ABNORMAL HIGH (ref 70–99)

## 2020-02-19 LAB — CBC
HCT: 42.1 % (ref 39.0–52.0)
Hemoglobin: 14.2 g/dL (ref 13.0–17.0)
MCH: 31.3 pg (ref 26.0–34.0)
MCHC: 33.7 g/dL (ref 30.0–36.0)
MCV: 92.7 fL (ref 80.0–100.0)
Platelets: 173 10*3/uL (ref 150–400)
RBC: 4.54 MIL/uL (ref 4.22–5.81)
RDW: 14.1 % (ref 11.5–15.5)
WBC: 9 10*3/uL (ref 4.0–10.5)
nRBC: 0 % (ref 0.0–0.2)

## 2020-02-19 LAB — URINALYSIS, ROUTINE W REFLEX MICROSCOPIC
Bacteria, UA: NONE SEEN
Bilirubin Urine: NEGATIVE
Glucose, UA: >=500 mg/dL — AB
Ketones, ur: NEGATIVE mg/dL
Leukocytes,Ua: NEGATIVE
Nitrite: NEGATIVE
Protein, ur: 30 mg/dL — AB
Specific Gravity, Urine: 1.035 — ABNORMAL HIGH (ref 1.005–1.030)
pH: 6 (ref 5.0–8.0)

## 2020-02-19 LAB — LIPASE, BLOOD: Lipase: 43 U/L (ref 11–51)

## 2020-02-19 MED ORDER — LORAZEPAM 2 MG/ML IJ SOLN
0.5000 mg | Freq: Once | INTRAMUSCULAR | Status: AC
  Administered 2020-02-19: 0.5 mg via INTRAVENOUS
  Filled 2020-02-19: qty 1

## 2020-02-19 MED ORDER — ONDANSETRON HCL 4 MG/2ML IJ SOLN
4.0000 mg | Freq: Once | INTRAMUSCULAR | Status: AC
  Administered 2020-02-19: 4 mg via INTRAVENOUS
  Filled 2020-02-19: qty 2

## 2020-02-19 MED ORDER — ONDANSETRON 4 MG PO TBDP: 4.0000 mg | ORAL_TABLET | Freq: Three times a day (TID) | ORAL | 0 refills | Status: DC | PRN

## 2020-02-19 MED ORDER — INSULIN ASPART 100 UNIT/ML ~~LOC~~ SOLN
10.0000 [IU] | SUBCUTANEOUS | Status: AC
  Administered 2020-02-19: 10 [IU] via SUBCUTANEOUS
  Filled 2020-02-19: qty 0.1

## 2020-02-19 MED ORDER — LACTATED RINGERS IV BOLUS
1000.0000 mL | Freq: Once | INTRAVENOUS | Status: AC
  Administered 2020-02-19: 1000 mL via INTRAVENOUS

## 2020-02-19 MED ORDER — PANTOPRAZOLE SODIUM 40 MG IV SOLR
40.0000 mg | Freq: Once | INTRAVENOUS | Status: AC
  Administered 2020-02-19: 40 mg via INTRAVENOUS
  Filled 2020-02-19: qty 40

## 2020-02-19 NOTE — ED Provider Notes (Signed)
[facility] DEPT Provider Note   CSN: [id number] Arrival date & time: 02/19/20  5449     History Chief Complaint  Patient presents with  . Blurred Vision  . Fatigue    Jorma D Shrout is a 59 y.o. male.  HPI Patient is a 59 year old male with a past medical history of hypertension, ulcerative colitis status post total colectomy, HLD, acid reflux.  Patient has a complex surgical history that includes greater than 10 abdominal surgeries.  He states that he is followed by gastroenterology at [facility].  This is where he has most of his care time.  He has suffered from ulcerative colitis for some time and complications from this including severe chronic diarrhea.  He has tried several 5-ASA medications in the past and not had relief from them.  He has been on steroids for the past month he is tapering down currently on 5 mg daily.  He states that his blood sugars have been elevated he has had frequent urination, has occasionally had some blurry vision he states he does wear glasses but he states that this intermittent blurry vision is new.  He denies any headache lightheadedness or dizziness.  Denies any chest pain but states he has felt somewhat short of breath for the past 3 months.  He states no changes today to the symptoms.  No vomiting.  He has had some intermittent nausea.     Past Medical History:  Diagnosis Date  . Arthritis   . GERD (gastroesophageal reflux disease)   . H/O ulcerative colitis   . Hyperlipidemia   . Hypertension   . Inguinal hernia   . Ulcer    ulcerative colitis  . Ulcerative colitis   . Wears glasses     Patient Active Problem List   Diagnosis Date Noted  . UTI due to extended-spectrum beta lactamase (ESBL) producing Escherichia coli 01/19/2020  . E coli bacteremia 01/18/2020  . Severe sepsis ([place]) 01/17/2020  . AKI (acute kidney injury) ([place]) 01/17/2020  . Thrombocytopenia ([place]) 01/17/2020  . Essential hypertension  01/17/2020  . Ulcerative colitis ([place]) 01/17/2020  . 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 02/09/2012    Past Surgical History:  Procedure Laterality Date  . APPENDECTOMY    . COLECTOMY     Proctocolectomy with IPAA  . COLECTOMY    . FOREIGN BODY REMOVAL ABDOMINAL  02/17/2012   Procedure: REMOVAL FOREIGN BODY ABDOMINAL;  Surgeon: Odis Hollingshead, MD;  Location: [facility];  Service: General;  Laterality: N/A;  abdominal wound exploration and removal of foreign body  . HERNIA REPAIR     Multiple incisional hernias.  . ileoanal anastomosis         Family History  Problem Relation Age of Onset  . Cancer Sister     Social History   Tobacco Use  . Smoking status: Never Smoker  . Smokeless tobacco: Never Used  Vaping Use  . Vaping Use: Never used  Substance Use Topics  . Alcohol use: Yes    Alcohol/week: 4.0 standard drinks    Types: 4 Cans of beer per week    Comment: 4 cans of beer per week  . Drug use: No    Home Medications Prior to Admission medications   Medication Sig Start Date End Date Taking? Authorizing Provider  acetaminophen (TYLENOL) 500 MG tablet Take 500 mg by mouth every 6 (six) hours as needed for mild pain.    Yes [provider]  amLODipine (NORVASC) 10 MG tablet Take 10 mg by mouth daily.  02/19/18  Yes [provider]  ascorbic acid (VITAMIN C) 500 MG tablet Take 500 mg by mouth daily.   Yes [provider]  atenolol (TENORMIN) 25 MG tablet Take 25 mg by mouth daily.    Yes [provider]  budesonide (ENTOCORT EC) 3 MG 24 hr capsule Take 9 mg by mouth daily. 02/15/20  Yes [provider]  cetirizine (ZYRTEC) 10 MG tablet Take 10 mg by mouth daily as needed for allergies.    Yes [provider]  Cholecalciferol (VITAMIN D3) 50000 units CAPS Take 50,000 Units by mouth every Wednesday.  03/05/18  Yes [provider]  dicyclomine (BENTYL) 20 MG tablet Take 1 tablet (20  mg total) by mouth 2 (two) times daily as needed for spasms. 06/14/18  Yes Caccavale, Sophia, PA-C  esomeprazole (NEXIUM) 20 MG capsule Take 20 mg by mouth daily.    Yes [provider]  ezetimibe (ZETIA) 10 MG tablet Take 10 mg by mouth daily. 01/06/20  Yes [provider]  gabapentin (NEURONTIN) 300 MG capsule Take 300 mg by mouth 3 (three) times daily as needed (nerve pain).  11/28/19  Yes [provider]  Glucosamine HCl (GLUCOSAMINE PO) Take 1 tablet by mouth daily.   Yes [provider]  hydrALAZINE (APRESOLINE) 50 MG tablet Take 50 mg by mouth in the morning and at bedtime.  10/10/19  Yes [provider]  loperamide (IMODIUM A-D) 2 MG tablet Take 2 mg by mouth 2 (two) times daily.    Yes [provider]  Multiple Vitamin (MULTIVITAMIN WITH MINERALS) TABS Take 1 tablet by mouth daily.   Yes [provider]  Omega-3 Fatty Acids (FISH OIL) 1000 MG CAPS Take 1 capsule by mouth daily.    Yes [provider]  predniSONE (DELTASONE) 10 MG tablet Take 10 mg by mouth See admin instructions. Take Prednisone 40 mg daily by mouth for 2 weeks, then 30 mg, 20 mg, 15 mg, 10 mg, 5 mg, daily each for 1 week. Patient sates he is on the 30 mg course as of today 01-17-20 12/28/19  Yes [provider]  psyllium (METAMUCIL) 58.6 % packet Take 1 packet by mouth daily as needed (For fiber/thickener).    Yes [provider]  rosuvastatin (CRESTOR) 10 MG tablet Take 10 mg by mouth every other day.  12/08/19  Yes [provider]  Turmeric (QC TUMERIC COMPLEX PO) Take 1 tablet by mouth daily as needed (For inflammatory).   Yes [provider]  vitamin B-12 (CYANOCOBALAMIN) 100 MCG tablet Take 100 mcg by mouth daily.   Yes [provider]  ondansetron (ZOFRAN ODT) 4 MG disintegrating tablet Take 1 tablet (4 mg total) by mouth every 8 (eight) hours as needed for nausea or vomiting. 02/19/20   Tedd Sias, PA    promethazine (PHENERGAN) 25 MG tablet Take 1 tablet (25 mg total) by mouth every 6 (six) hours as needed for nausea. Patient not taking: Reported on 03/31/2018 03/02/12   Dalia Heading, PA-C    Allergies    Asacol [mesalamine], Penicillins, Sulfa antibiotics, Bee pollen, Bee venom, and Iodinated diagnostic agents  Review of Systems   Review of Systems  Constitutional: Negative for chills and fever.  HENT: Negative for congestion.   Eyes: Positive for visual disturbance. Negative for pain.  Respiratory: Negative for cough and shortness of breath.   Cardiovascular: Negative for chest pain and  leg swelling.  Gastrointestinal: Positive for abdominal pain and nausea. Negative for diarrhea and vomiting.  Genitourinary: Positive for frequency. Negative for dysuria.  Musculoskeletal: Negative for myalgias.  Skin: Negative for rash.  Neurological: Negative for dizziness and headaches.    Physical Exam Updated Vital Signs BP 114/81 (BP Location: Right Arm)   Pulse 98   Temp 98.5 F (36.9 C) (Oral)   Resp 18   Ht 5' 10.5" (1.791 m)   Wt 108 kg   SpO2 95%   BMI 33.68 kg/m   Physical Exam Vitals and nursing note reviewed.  Constitutional:      General: He is not in acute distress.    Comments: Pleasant 59 year old male in no acute distress sitting comfortably in bed  HENT:     Head: Normocephalic and atraumatic.     Nose: Nose normal.     Mouth/Throat:     Mouth: Mucous membranes are dry.  Eyes:     General: No scleral icterus. Cardiovascular:     Rate and Rhythm: Normal rate and regular rhythm.     Pulses: Normal pulses.     Heart sounds: Normal heart sounds.  Pulmonary:     Effort: Pulmonary effort is normal. No respiratory distress.     Breath sounds: No wheezing.  Abdominal:     Palpations: Abdomen is soft.     Tenderness: There is no abdominal tenderness. There is no right CVA tenderness, left CVA tenderness, guarding or rebound.     Comments: Remote well-healed  surgical scars  Musculoskeletal:     Cervical back: Normal range of motion.     Right lower leg: No edema.     Left lower leg: No edema.  Skin:    General: Skin is warm and dry.     Capillary Refill: Capillary refill takes less than 2 seconds.  Neurological:     Mental Status: He is alert. Mental status is at baseline.  Psychiatric:        Mood and Affect: Mood normal.        Behavior: Behavior normal.     ED Results / Procedures / Treatments   Labs (all labs ordered are listed, but only abnormal results are displayed) Labs Reviewed  URINALYSIS, ROUTINE W REFLEX MICROSCOPIC - Abnormal; Notable for the following components:      Result Value   Specific Gravity, Urine 1.035 (*)    Glucose, UA >=500 (*)    Hgb urine dipstick MODERATE (*)    Protein, ur 30 (*)    All other components within normal limits  COMPREHENSIVE METABOLIC PANEL - Abnormal; Notable for the following components:   Sodium 133 (*)    Chloride 96 (*)    Glucose, Bld 477 (*)    BUN 21 (*)    Creatinine, Ser 1.66 (*)    GFR calc non Af Amer 45 (*)    GFR calc Af Amer 53 (*)    All other components within normal limits  CBG MONITORING, ED - Abnormal; Notable for the following components:   Glucose-Capillary 456 (*)    All other components within normal limits  CBG MONITORING, ED - Abnormal; Notable for the following components:   Glucose-Capillary 303 (*)    All other components within normal limits  CBG MONITORING, ED - Abnormal; Notable for the following components:   Glucose-Capillary 222 (*)    All other components within normal limits  CBC  LIPASE, BLOOD    EKG EKG Interpretation  Date/Time:  Sunday February 19 2020 09:45:48 EDT Ventricular Rate:  107 PR Interval:    QRS Duration: 96 QT Interval:  347 QTC Calculation: 463 R Axis:   36 Text Interpretation: Sinus tachycardia Probable left ventricular hypertrophy Confirmed by Malvin Johns [phone]) on 02/19/2020 12:16:38 PM   Radiology CT  HEAD WO CONTRAST  Result Date: 02/19/2020 CLINICAL DATA:  Diplopia, dizziness, fatigue EXAM: CT HEAD WITHOUT CONTRAST TECHNIQUE: Contiguous axial images were obtained from the base of the skull through the vertex without intravenous contrast. COMPARISON:  06/22/2006 FINDINGS: Brain: No evidence of acute infarction, hemorrhage, hydrocephalus, extra-axial collection or mass lesion/mass effect. Mild subcortical white matter and periventricular small vessel ischemic changes. Vascular: No hyperdense vessel or unexpected calcification. Skull: Normal. Negative for fracture or focal lesion. Sinuses/Orbits: The visualized paranasal sinuses are essentially clear. The mastoid air cells are unopacified. Other: None. IMPRESSION: No evidence of acute intracranial abnormality. Mild small vessel ischemic changes. Electronically Signed   By: Julian Hy M.D.   On: 02/19/2020 08:02   DG Chest Portable 1 View  Result Date: 02/19/2020 CLINICAL DATA:  Patient reported fatigue, blurred vision, and that his balance has been off. Pt also reported a blood sugar of 500 this morning. Medical hx of HTN and GERD. EXAM: PORTABLE CHEST 1 VIEW COMPARISON:  01/17/2020 FINDINGS: Cardiac silhouette is normal in size. No mediastinal or hilar masses or evidence of adenopathy. Clear lungs.  No pleural effusion or pneumothorax. Skeletal structures are grossly intact. IMPRESSION: No active disease. Electronically Signed   By: Lajean Manes M.D.   On: 02/19/2020 10:13    Procedures Procedures (including critical care time)  Medications Ordered in ED Medications  pantoprazole (PROTONIX) injection 40 mg (40 mg Intravenous Given 02/19/20 1104)  ondansetron (ZOFRAN) injection 4 mg (4 mg Intravenous Given 02/19/20 1105)  lactated ringers bolus 1,000 mL (0 mLs Intravenous Stopped 02/19/20 1300)  LORazepam (ATIVAN) injection 0.5 mg (0.5 mg Intravenous Given 02/19/20 1105)  insulin aspart (novoLOG) injection 10 Units (10 Units Subcutaneous Given  02/19/20 1141)    ED Course  I have reviewed the triage vital signs and the nursing notes.  Pertinent labs & imaging results that were available during my care of the patient were reviewed by me and considered in my medical decision making (see chart for details).    MDM Rules/Calculators/A&P                          Patient is a 59 year old gentleman with a past medical history detailed above presented today for some abdominal pain, urinary frequency, feeling of dehydration, dry mouth, feeling antsy and hyper after he has been on prednisone for approximately 1 month. He is on this because of ulcerative colitis however he has had for a total colectomy at this point.  Physical exam is completely reassuring he has no tachycardia on my exam however he did have some tachycardia earlier. Has no abdominal tenderness. His overall reassuring.  CMP notable for significantly elevated blood sugar which is confirmed with CMP which shows BS of 477. His creatinine is very mildly bumped from his usual 1.3 baseline. It is 1.6 today. Will provide patient with fluids and small aliquot of 10 units of subcu insulin. No other electrolyte abnormalities apart from some very mild hyponatremia which corrects to normal given his hyperglycemia. CBC without leukocytosis or anemia. Lipase is minimally limits doubt pancreatitis. Urinalysis showed some hematuria which I discussed with him. He will follow-up with his PCP to  recheck this. He will discontinue the half tablet of prednisone he is currently on and will follow up with his primary care doctor and his [facility] gastroenterologist.  Patient given a precautions. The medical records were personally reviewed by myself. I personally reviewed all lab results and interpreted all imaging studies and either concurred with their official read or contacted radiology for clarification. Additional history obtained from old records.  This patient appears reasonably screened and I  doubt any other medical condition requiring further workup, evaluation, or treatment in the ED at this time prior to discharge.   Patient's vitals are WNL apart from vital sign abnormalities discussed above, patient is in NAD, and able to ambulate in the ED at their baseline and able to tolerate PO.  Pain has been managed or a plan has been made for home management and has no complaints prior to discharge. Patient is comfortable with above plan and for discharge at this time. All questions were answered prior to disposition. Results from the ER workup discussed with the patient face to face and all questions answered to the best of my ability. The patient is safe for discharge with strict return precautions. Patient appears safe for discharge with appropriate follow-up. Conveyed my impression with the patient and they voiced understanding and are agreeable to plan.   An After Visit Summary was printed and given to the patient.  Portions of this note were generated with [employer]. Dictation errors may occur despite best attempts at proofreading.  I discussed this case with my attending physician who cosigned this note including patient's presenting symptoms, physical exam, and planned diagnostics and interventions. Attending physician stated agreement with plan or made changes to plan which were implemented.    Final Clinical Impression(s) / ED Diagnoses Final diagnoses:  Dehydration  Hyperglycemia    Rx / DC Orders ED Discharge Orders         Ordered    ondansetron (ZOFRAN ODT) 4 MG disintegrating tablet  Every 8 hours PRN        02/19/20 1346           Pati Gallo [place], [place] 02/19/20 1804    Malvin Johns, MD 02/20/20 [phone]

## 2020-02-19 NOTE — Discharge Instructions (Addendum)
Please follow-up this week with your gastroenterologist.  Please follow-up your primary care doctor as well.  Her blood sugars were elevated likely secondary to dehydration and the steroids.  I given you a nausea medication called Zofran.  Please take this as prescribed.  Please have your blood work rechecked by your primary care doctor in the next week.  You have a small amount of blood in your urine but you have no symptoms of a kidney stone I believe you should follow-up with urology to have this evaluated if you continue to have blood in your urine you may need to have further evaluation such as a cystoscopy where they use a camera to look up your urethra into your bladder.  As this could be a sign of cancer it is important that you get this rechecked.  You may have your first rechecked in by your primary care doctor however I have given you a referral to urologist in case this comes back positive for blood. It is very important that you drink plenty of water.  You do appear dehydrated and this can significantly worsen your high blood sugar.  I provided you with a work note for the next few days.  I recommend you stop using prednisone as you are already on a low dose and I suspect that this is exacerbating her symptoms.  Your gastroenterologist in [place] may have additional recommendations however at this time I think stopping the prednisone is the best course of action.

## 2020-02-19 NOTE — ED Triage Notes (Signed)
Patient reports fatigue, blurred vision, and that his balance has been off. Patient reports he was recently hospitalized for ESBL infection

## 2020-03-08 ENCOUNTER — Telehealth
Admit: 2020-03-08 | Discharge: 2020-03-09 | Payer: PRIVATE HEALTH INSURANCE | Attending: Gastroenterology | Primary: Gastroenterology

## 2020-03-28 DIAGNOSIS — K91850 Pouchitis: Principal | ICD-10-CM

## 2020-03-28 MED ORDER — CIPROFLOXACIN 500 MG TABLET
ORAL_TABLET | Freq: Two times a day (BID) | ORAL | 2 refills | 30 days | Status: CP
Start: 2020-03-28 — End: ?

## 2020-04-09 MED ORDER — DICYCLOMINE 10 MG CAPSULE
ORAL_CAPSULE | Freq: Four times a day (QID) | ORAL | 2 refills | 30.00000 days | Status: CP
Start: 2020-04-09 — End: 2021-04-09

## 2020-04-16 MED ORDER — ESOMEPRAZOLE MAGNESIUM 20 MG CAPSULE,DELAYED RELEASE
ORAL_CAPSULE | Freq: Every day | ORAL | 3 refills | 90.00000 days | Status: CP
Start: 2020-04-16 — End: ?

## 2020-04-17 MED ORDER — CHOLECALCIFEROL (VITAMIN D3) 1,250 MCG (50,000 UNIT) CAPSULE
ORAL_CAPSULE | ORAL | 0 refills | 182.00000 days | Status: CP
Start: 2020-04-17 — End: 2020-05-13

## 2020-04-30 MED ORDER — BUDESONIDE DR - ER 3 MG CAPSULE,DELAYED,EXTENDED RELEASE
ORAL_CAPSULE | ORAL | 3 refills | 0.00000 days | Status: CP
Start: 2020-04-30 — End: ?

## 2020-07-12 DIAGNOSIS — K91850 Pouchitis: Principal | ICD-10-CM

## 2020-07-12 MED ORDER — VANCOMYCIN 125 MG CAPSULE
ORAL_CAPSULE | Freq: Four times a day (QID) | ORAL | 0 refills | 14 days | Status: CP
Start: 2020-07-12 — End: ?

## 2020-08-16 ENCOUNTER — Ambulatory Visit
Admit: 2020-08-16 | Discharge: 2020-08-17 | Payer: PRIVATE HEALTH INSURANCE | Attending: Gastroenterology | Primary: Gastroenterology

## 2020-09-10 DIAGNOSIS — K91850 Pouchitis: Principal | ICD-10-CM

## 2020-09-10 MED ORDER — CEFDINIR 300 MG CAPSULE
ORAL_CAPSULE | Freq: Two times a day (BID) | ORAL | 0 refills | 30 days | Status: CP
Start: 2020-09-10 — End: ?

## 2020-09-21 DIAGNOSIS — K91850 Pouchitis: Principal | ICD-10-CM

## 2020-09-21 MED ORDER — CIPROFLOXACIN 500 MG TABLET
ORAL_TABLET | Freq: Two times a day (BID) | ORAL | 2 refills | 30 days | Status: CP
Start: 2020-09-21 — End: ?

## 2020-10-04 MED ORDER — RIFAXIMIN 550 MG TABLET
ORAL_TABLET | Freq: Three times a day (TID) | ORAL | 0 refills | 14.00000 days | Status: CP
Start: 2020-10-04 — End: 2020-10-18

## 2020-10-19 DIAGNOSIS — K580 Irritable bowel syndrome with diarrhea: Principal | ICD-10-CM

## 2020-10-19 MED ORDER — RIFAXIMIN 550 MG TABLET
ORAL_TABLET | Freq: Two times a day (BID) | ORAL | 2 refills | 30.00000 days | Status: CP
Start: 2020-10-19 — End: 2020-10-19

## 2020-10-19 MED ORDER — HYOSCYAMINE SULFATE 0.125 MG TABLET
ORAL_TABLET | ORAL | 3 refills | 20.00000 days | Status: CP | PRN
Start: 2020-10-19 — End: 2020-10-19

## 2020-10-19 MED ORDER — ONDANSETRON HCL 4 MG TABLET
ORAL_TABLET | Freq: Three times a day (TID) | ORAL | 2 refills | 10.00000 days | Status: CP | PRN
Start: 2020-10-19 — End: 2020-10-19

## 2020-10-19 MED ORDER — HYOSCYAMINE 0.125 MG SUBLINGUAL TABLET
ORAL_TABLET | SUBLINGUAL | 5 refills | 20 days | Status: CP | PRN
Start: 2020-10-19 — End: ?

## 2020-12-11 ENCOUNTER — Inpatient Hospital Stay (HOSPITAL_COMMUNITY)
Admission: EM | Admit: 2020-12-11 | Discharge: 2020-12-21 | DRG: 917 | Disposition: A | Discharge status: Rehabilitation Facility | Payer: 59 | Attending: Family Medicine | Admitting: Family Medicine

## 2020-12-11 ENCOUNTER — Encounter (HOSPITAL_COMMUNITY): Payer: Self-pay | Admitting: Emergency Medicine

## 2020-12-11 ENCOUNTER — Inpatient Hospital Stay (HOSPITAL_COMMUNITY): Payer: 59

## 2020-12-11 ENCOUNTER — Emergency Department (HOSPITAL_COMMUNITY): Payer: 59

## 2020-12-11 DIAGNOSIS — I480 Paroxysmal atrial fibrillation: Secondary | ICD-10-CM | POA: Diagnosis present

## 2020-12-11 DIAGNOSIS — T405X1A Poisoning by cocaine, accidental (unintentional), initial encounter: Principal | ICD-10-CM | POA: Diagnosis present

## 2020-12-11 DIAGNOSIS — Z79899 Other long term (current) drug therapy: Secondary | ICD-10-CM

## 2020-12-11 DIAGNOSIS — E785 Hyperlipidemia, unspecified: Secondary | ICD-10-CM | POA: Diagnosis present

## 2020-12-11 DIAGNOSIS — Z781 Physical restraint status: Secondary | ICD-10-CM

## 2020-12-11 DIAGNOSIS — Z809 Family history of malignant neoplasm, unspecified: Secondary | ICD-10-CM

## 2020-12-11 DIAGNOSIS — K5190 Ulcerative colitis, unspecified, without complications: Secondary | ICD-10-CM | POA: Diagnosis present

## 2020-12-11 DIAGNOSIS — I6389 Other cerebral infarction: Secondary | ICD-10-CM | POA: Diagnosis not present

## 2020-12-11 DIAGNOSIS — E441 Mild protein-calorie malnutrition: Secondary | ICD-10-CM | POA: Diagnosis present

## 2020-12-11 DIAGNOSIS — Z882 Allergy status to sulfonamides status: Secondary | ICD-10-CM

## 2020-12-11 DIAGNOSIS — I129 Hypertensive chronic kidney disease with stage 1 through stage 4 chronic kidney disease, or unspecified chronic kidney disease: Secondary | ICD-10-CM | POA: Diagnosis present

## 2020-12-11 DIAGNOSIS — M62830 Muscle spasm of back: Secondary | ICD-10-CM | POA: Diagnosis not present

## 2020-12-11 DIAGNOSIS — M1990 Unspecified osteoarthritis, unspecified site: Secondary | ICD-10-CM | POA: Diagnosis present

## 2020-12-11 DIAGNOSIS — I615 Nontraumatic intracerebral hemorrhage, intraventricular: Secondary | ICD-10-CM

## 2020-12-11 DIAGNOSIS — Z9114 Patient's other noncompliance with medication regimen: Secondary | ICD-10-CM

## 2020-12-11 DIAGNOSIS — G9341 Metabolic encephalopathy: Secondary | ICD-10-CM | POA: Diagnosis not present

## 2020-12-11 DIAGNOSIS — G936 Cerebral edema: Secondary | ICD-10-CM | POA: Diagnosis present

## 2020-12-11 DIAGNOSIS — I629 Nontraumatic intracranial hemorrhage, unspecified: Secondary | ICD-10-CM

## 2020-12-11 DIAGNOSIS — N179 Acute kidney failure, unspecified: Secondary | ICD-10-CM | POA: Diagnosis present

## 2020-12-11 DIAGNOSIS — Z20822 Contact with and (suspected) exposure to covid-19: Secondary | ICD-10-CM | POA: Diagnosis present

## 2020-12-11 DIAGNOSIS — E871 Hypo-osmolality and hyponatremia: Secondary | ICD-10-CM | POA: Diagnosis not present

## 2020-12-11 DIAGNOSIS — E119 Type 2 diabetes mellitus without complications: Secondary | ICD-10-CM | POA: Diagnosis not present

## 2020-12-11 DIAGNOSIS — E860 Dehydration: Secondary | ICD-10-CM | POA: Diagnosis not present

## 2020-12-11 DIAGNOSIS — F1410 Cocaine abuse, uncomplicated: Secondary | ICD-10-CM | POA: Diagnosis present

## 2020-12-11 DIAGNOSIS — N1831 Chronic kidney disease, stage 3a: Secondary | ICD-10-CM | POA: Diagnosis present

## 2020-12-11 DIAGNOSIS — I10 Essential (primary) hypertension: Secondary | ICD-10-CM | POA: Diagnosis not present

## 2020-12-11 DIAGNOSIS — Z6828 Body mass index (BMI) 28.0-28.9, adult: Secondary | ICD-10-CM

## 2020-12-11 DIAGNOSIS — G914 Hydrocephalus in diseases classified elsewhere: Secondary | ICD-10-CM | POA: Diagnosis present

## 2020-12-11 DIAGNOSIS — E1122 Type 2 diabetes mellitus with diabetic chronic kidney disease: Secondary | ICD-10-CM | POA: Diagnosis not present

## 2020-12-11 DIAGNOSIS — I161 Hypertensive emergency: Secondary | ICD-10-CM | POA: Diagnosis not present

## 2020-12-11 DIAGNOSIS — I69190 Apraxia following nontraumatic intracerebral hemorrhage: Secondary | ICD-10-CM | POA: Diagnosis not present

## 2020-12-11 DIAGNOSIS — K51919 Ulcerative colitis, unspecified with unspecified complications: Secondary | ICD-10-CM | POA: Diagnosis not present

## 2020-12-11 DIAGNOSIS — R001 Bradycardia, unspecified: Secondary | ICD-10-CM

## 2020-12-11 DIAGNOSIS — R569 Unspecified convulsions: Secondary | ICD-10-CM | POA: Diagnosis not present

## 2020-12-11 DIAGNOSIS — K219 Gastro-esophageal reflux disease without esophagitis: Secondary | ICD-10-CM | POA: Diagnosis present

## 2020-12-11 DIAGNOSIS — E876 Hypokalemia: Secondary | ICD-10-CM | POA: Diagnosis not present

## 2020-12-11 DIAGNOSIS — E7800 Pure hypercholesterolemia, unspecified: Secondary | ICD-10-CM | POA: Diagnosis not present

## 2020-12-11 DIAGNOSIS — Z91041 Radiographic dye allergy status: Secondary | ICD-10-CM

## 2020-12-11 DIAGNOSIS — I619 Nontraumatic intracerebral hemorrhage, unspecified: Secondary | ICD-10-CM

## 2020-12-11 DIAGNOSIS — Z888 Allergy status to other drugs, medicaments and biological substances status: Secondary | ICD-10-CM

## 2020-12-11 DIAGNOSIS — G935 Compression of brain: Secondary | ICD-10-CM | POA: Diagnosis present

## 2020-12-11 DIAGNOSIS — I610 Nontraumatic intracerebral hemorrhage in hemisphere, subcortical: Secondary | ICD-10-CM | POA: Diagnosis present

## 2020-12-11 DIAGNOSIS — Z880 Allergy status to penicillin: Secondary | ICD-10-CM

## 2020-12-11 DIAGNOSIS — Z91030 Bee allergy status: Secondary | ICD-10-CM

## 2020-12-11 DIAGNOSIS — E669 Obesity, unspecified: Secondary | ICD-10-CM | POA: Diagnosis present

## 2020-12-11 HISTORY — DX: Nontraumatic intracerebral hemorrhage, unspecified: I61.9

## 2020-12-11 LAB — BASIC METABOLIC PANEL WITH GFR
Anion gap: 9 (ref 5–15)
BUN: 10 mg/dL (ref 6–20)
CO2: 24 mmol/L (ref 22–32)
Calcium: 10.2 mg/dL (ref 8.9–10.3)
Chloride: 108 mmol/L (ref 98–111)
Creatinine, Ser: 1.31 mg/dL — ABNORMAL HIGH (ref 0.61–1.24)
GFR, Estimated: >60 mL/min
Glucose, Bld: 125 mg/dL — ABNORMAL HIGH (ref 70–99)
Potassium: 3.9 mmol/L (ref 3.5–5.1)
Sodium: 141 mmol/L (ref 135–145)

## 2020-12-11 LAB — HEPATIC FUNCTION PANEL
ALT: 15 U/L (ref 0–44)
AST: 16 U/L (ref 15–41)
Albumin: 4.5 g/dL (ref 3.5–5.0)
Alkaline Phosphatase: 76 U/L (ref 38–126)
Bilirubin, Direct: 0.1 mg/dL (ref 0.0–0.2)
Indirect Bilirubin: 0.9 mg/dL (ref 0.3–0.9)
Total Bilirubin: 1 mg/dL (ref 0.3–1.2)
Total Protein: 8.4 g/dL — ABNORMAL HIGH (ref 6.5–8.1)

## 2020-12-11 LAB — CBC WITH DIFFERENTIAL/PLATELET
Abs Immature Granulocytes: 0.02 10*3/uL (ref 0.00–0.07)
Basophils Absolute: 0 10*3/uL (ref 0.0–0.1)
Basophils Relative: 0 %
Eosinophils Absolute: 0 10*3/uL (ref 0.0–0.5)
Eosinophils Relative: 0 %
HCT: 41.9 % (ref 39.0–52.0)
Hemoglobin: 13.4 g/dL (ref 13.0–17.0)
Immature Granulocytes: 0 %
Lymphocytes Relative: 12 %
Lymphs Abs: 0.9 10*3/uL (ref 0.7–4.0)
MCH: 30.5 pg (ref 26.0–34.0)
MCHC: 32 g/dL (ref 30.0–36.0)
MCV: 95.4 fL (ref 80.0–100.0)
Monocytes Absolute: 0.4 10*3/uL (ref 0.1–1.0)
Monocytes Relative: 6 %
Neutro Abs: 5.5 10*3/uL (ref 1.7–7.7)
Neutrophils Relative %: 82 %
Platelets: 203 10*3/uL (ref 150–400)
RBC: 4.39 MIL/uL (ref 4.22–5.81)
RDW: 15 % (ref 11.5–15.5)
WBC: 6.9 10*3/uL (ref 4.0–10.5)
nRBC: 0 % (ref 0.0–0.2)

## 2020-12-11 LAB — RESP PANEL BY RT-PCR (FLU A&B, COVID) ARPGX2
Influenza A by PCR: NEGATIVE
Influenza B by PCR: NEGATIVE
SARS Coronavirus 2 by RT PCR: NEGATIVE

## 2020-12-11 LAB — PROTIME-INR
INR: 0.9 (ref 0.8–1.2)
Prothrombin Time: 12 seconds (ref 11.4–15.2)

## 2020-12-11 MED ORDER — PROCHLORPERAZINE EDISYLATE 10 MG/2ML IJ SOLN
10.0000 mg | Freq: Once | INTRAMUSCULAR | Status: AC
  Administered 2020-12-11: 10 mg via INTRAVENOUS
  Filled 2020-12-11: qty 2

## 2020-12-11 MED ORDER — FENTANYL CITRATE (PF) 100 MCG/2ML IJ SOLN
12.5000 ug | INTRAMUSCULAR | Status: DC | PRN
  Administered 2020-12-11 – 2020-12-12 (×2): 12.5 ug via INTRAVENOUS
  Filled 2020-12-11 (×2): qty 2

## 2020-12-11 MED ORDER — DEXAMETHASONE SODIUM PHOSPHATE 10 MG/ML IJ SOLN
10.0000 mg | Freq: Once | INTRAMUSCULAR | Status: AC
  Administered 2020-12-11: 10 mg via INTRAVENOUS
  Filled 2020-12-11: qty 1

## 2020-12-11 MED ORDER — SODIUM CHLORIDE 0.9 % IV BOLUS
1000.0000 mL | Freq: Once | INTRAVENOUS | Status: AC
  Administered 2020-12-11: 1000 mL via INTRAVENOUS

## 2020-12-11 MED ORDER — NICARDIPINE HCL IN NACL 20-0.86 MG/200ML-% IV SOLN
3.0000 mg/h | INTRAVENOUS | Status: DC
  Administered 2020-12-11: 5 mg/h via INTRAVENOUS
  Filled 2020-12-11 (×3): qty 200

## 2020-12-11 MED ORDER — ONDANSETRON HCL 4 MG/2ML IJ SOLN
4.0000 mg | Freq: Four times a day (QID) | INTRAMUSCULAR | Status: DC | PRN
  Administered 2020-12-11 – 2020-12-21 (×3): 4 mg via INTRAVENOUS
  Filled 2020-12-11 (×4): qty 2

## 2020-12-11 MED ORDER — ACETAMINOPHEN 325 MG PO TABS
650.0000 mg | ORAL_TABLET | Freq: Four times a day (QID) | ORAL | Status: DC | PRN
  Administered 2020-12-13 – 2020-12-21 (×18): 650 mg via ORAL
  Filled 2020-12-11 (×18): qty 2

## 2020-12-11 MED ORDER — CHLORHEXIDINE GLUCONATE CLOTH 2 % EX PADS
6.0000 | MEDICATED_PAD | Freq: Every day | CUTANEOUS | Status: DC
  Administered 2020-12-12 – 2020-12-16 (×4): 6 via TOPICAL

## 2020-12-11 MED ORDER — DIPHENHYDRAMINE HCL 50 MG/ML IJ SOLN
25.0000 mg | Freq: Once | INTRAMUSCULAR | Status: AC
  Administered 2020-12-11: 25 mg via INTRAVENOUS
  Filled 2020-12-11: qty 1

## 2020-12-11 NOTE — ED Notes (Signed)
MD Curatolo informed of pts elevated BP.

## 2020-12-11 NOTE — ED Provider Notes (Signed)
[facility] DEPT Provider Note   CSN: [id number] Arrival date & time: 12/11/20  1722     History Chief Complaint  Patient presents with   Headache   Hypertension    Matthew Francis is a 60 y.o. male.  The history is provided by the patient.  Headache Pain location:  Generalized Quality:  Dull Radiates to:  Does not radiate Onset quality:  Gradual Duration:  10 hours Timing:  Constant Chronicity:  New Similar to prior headaches: yes   Relieved by:  Acetaminophen Worsened by:  Nothing Associated symptoms: nausea   Associated symptoms: no abdominal pain, no back pain, no blurred vision, no congestion, no cough, no diarrhea, no dizziness, no drainage, no ear pain, no eye pain, no fever, no numbness, no seizures, no sore throat, no vomiting and no weakness   Hypertension Associated symptoms include headaches. Pertinent negatives include no chest pain, no abdominal pain and no shortness of breath.      Past Medical History:  Diagnosis Date   Arthritis    GERD (gastroesophageal reflux disease)    H/O ulcerative colitis    Hyperlipidemia    Hypertension    Inguinal hernia    Ulcer    ulcerative colitis   Ulcerative colitis    Wears glasses     Patient Active Problem List   Diagnosis Date Noted   ICH (intracerebral hemorrhage) ([place]) 12/11/2020   UTI due to extended-spectrum beta lactamase (ESBL) producing Escherichia coli 01/19/2020   E coli bacteremia 01/18/2020   Severe sepsis ([place]) 01/17/2020   AKI (acute kidney injury) ([place]) 01/17/2020   Thrombocytopenia ([place]) 01/17/2020   Essential hypertension 01/17/2020   Ulcerative colitis ([place]) 01/17/2020   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 02/09/2012    Past Surgical History:  Procedure Laterality Date   APPENDECTOMY     COLECTOMY     Proctocolectomy with IPAA   COLECTOMY     FOREIGN BODY REMOVAL ABDOMINAL  02/17/2012   Procedure: REMOVAL FOREIGN BODY ABDOMINAL;   Surgeon: Odis Hollingshead, MD;  Location: [facility];  Service: General;  Laterality: N/A;  abdominal wound exploration and removal of foreign body   HERNIA REPAIR     Multiple incisional hernias.   ileoanal anastomosis         Family History  Problem Relation Age of Onset   Cancer Sister     Social History   Tobacco Use   Smoking status: Never   Smokeless tobacco: Never  Vaping Use   Vaping Use: Never used  Substance Use Topics   Alcohol use: Yes    Alcohol/week: 4.0 standard drinks    Types: 4 Cans of beer per week    Comment: 4 cans of beer per week   Drug use: No    Home Medications Prior to Admission medications   Medication Sig Start Date End Date Taking? Authorizing Provider  acetaminophen (TYLENOL) 500 MG tablet Take 500 mg by mouth every 6 (six) hours as needed for mild pain.     [provider]  amLODipine (NORVASC) 10 MG tablet Take 10 mg by mouth daily.  02/19/18   [provider]  ascorbic acid (VITAMIN C) 500 MG tablet Take 500 mg by mouth daily.    [provider]  atenolol (TENORMIN) 25 MG tablet Take 25 mg by mouth daily.     [provider]  budesonide (ENTOCORT EC) 3 MG 24 hr capsule Take 9 mg by mouth  daily. 02/15/20   [provider]  cetirizine (ZYRTEC) 10 MG tablet Take 10 mg by mouth daily as needed for allergies.     [provider]  Cholecalciferol (VITAMIN D3) 50000 units CAPS Take 50,000 Units by mouth every Wednesday.  03/05/18   [provider]  dicyclomine (BENTYL) 20 MG tablet Take 1 tablet (20 mg total) by mouth 2 (two) times daily as needed for spasms. 06/14/18   Caccavale, Sophia, PA-C  esomeprazole (NEXIUM) 20 MG capsule Take 20 mg by mouth daily.     [provider]  ezetimibe (ZETIA) 10 MG tablet Take 10 mg by mouth daily. 01/06/20   [provider]  gabapentin (NEURONTIN) 300 MG capsule Take 300 mg by mouth 3 (three) times daily as needed (nerve  pain).  11/28/19   [provider]  Glucosamine HCl (GLUCOSAMINE PO) Take 1 tablet by mouth daily.    [provider]  hydrALAZINE (APRESOLINE) 50 MG tablet Take 50 mg by mouth in the morning and at bedtime.  10/10/19   [provider]  loperamide (IMODIUM A-D) 2 MG tablet Take 2 mg by mouth 2 (two) times daily.     [provider]  Multiple Vitamin (MULTIVITAMIN WITH MINERALS) TABS Take 1 tablet by mouth daily.    [provider]  Omega-3 Fatty Acids (FISH OIL) 1000 MG CAPS Take 1 capsule by mouth daily.     [provider]  ondansetron (ZOFRAN ODT) 4 MG disintegrating tablet Take 1 tablet (4 mg total) by mouth every 8 (eight) hours as needed for nausea or vomiting. 02/19/20   Tedd Sias, PA  predniSONE (DELTASONE) 10 MG tablet Take 10 mg by mouth See admin instructions. Take Prednisone 40 mg daily by mouth for 2 weeks, then 30 mg, 20 mg, 15 mg, 10 mg, 5 mg, daily each for 1 week. Patient sates he is on the 30 mg course as of today 01-17-20 12/28/19   [provider]  promethazine (PHENERGAN) 25 MG tablet Take 1 tablet (25 mg total) by mouth every 6 (six) hours as needed for nausea. Patient not taking: Reported on 03/31/2018 03/02/12   Dalia Heading, PA-C  psyllium (METAMUCIL) 58.6 % packet Take 1 packet by mouth daily as needed (For fiber/thickener).     [provider]  rosuvastatin (CRESTOR) 10 MG tablet Take 10 mg by mouth every other day.  12/08/19   [provider]  Turmeric (QC TUMERIC COMPLEX PO) Take 1 tablet by mouth daily as needed (For inflammatory).    [provider]  vitamin B-12 (CYANOCOBALAMIN) 100 MCG tablet Take 100 mcg by mouth daily.    [provider]    Allergies    Asacol [mesalamine], Penicillins, Sulfa antibiotics, Bee pollen, Bee venom, and Iodinated diagnostic agents  Review of Systems   Review of Systems  Constitutional:  Negative for chills and fever.  HENT:   Negative for congestion, ear pain, postnasal drip and sore throat.   Eyes:  Negative for blurred vision, pain and visual disturbance.  Respiratory:  Negative for cough and shortness of breath.   Cardiovascular:  Negative for chest pain and palpitations.  Gastrointestinal:  Positive for nausea. Negative for abdominal pain, diarrhea and vomiting.  Genitourinary:  Negative for dysuria and hematuria.  Musculoskeletal:  Negative for arthralgias and back pain.  Skin:  Negative for color change and rash.  Neurological:  Positive for headaches. Negative for dizziness, tremors, seizures, syncope, facial asymmetry, speech difficulty, weakness, light-headedness and  numbness.  All other systems reviewed and are negative.  Physical Exam Updated Vital Signs BP (!) 161/95   Pulse 61   Temp 98.9 F (37.2 C) (Oral)   Resp 17   SpO2 99%   Physical Exam Vitals and nursing note reviewed.  Constitutional:      Appearance: He is well-developed.  HENT:     Head: Normocephalic and atraumatic.     Mouth/Throat:     Mouth: Mucous membranes are moist.  Eyes:     General: No visual field deficit.    Extraocular Movements: Extraocular movements intact.     Conjunctiva/sclera: Conjunctivae normal.     Pupils: Pupils are equal, round, and reactive to light.  Cardiovascular:     Rate and Rhythm: Normal rate and regular rhythm.     Heart sounds: Normal heart sounds. No murmur heard. Pulmonary:     Effort: Pulmonary effort is normal. No respiratory distress.     Breath sounds: Normal breath sounds.  Abdominal:     Palpations: Abdomen is soft.     Tenderness: There is no abdominal tenderness.  Musculoskeletal:     Cervical back: Normal range of motion and neck supple. No rigidity.  Skin:    General: Skin is warm and dry.  Neurological:     Mental Status: He is alert and oriented to person, place, and time.     Cranial Nerves: No cranial nerve deficit, dysarthria or facial asymmetry.     Sensory: No  sensory deficit.     Motor: No weakness.     Coordination: Coordination normal.  Psychiatric:        Mood and Affect: Mood normal.    ED Results / Procedures / Treatments   Labs (all labs ordered are listed, but only abnormal results are displayed) Labs Reviewed  BASIC METABOLIC PANEL - Abnormal; Notable for the following components:      Result Value   Glucose, Bld 125 (*)    Creatinine, Ser 1.31 (*)    All other components within normal limits  RESP PANEL BY RT-PCR (FLU A&B, COVID) ARPGX2  CBC WITH DIFFERENTIAL/PLATELET  PROTIME-INR  HEPATIC FUNCTION PANEL  URINALYSIS, ROUTINE W REFLEX MICROSCOPIC  RAPID URINE DRUG SCREEN, HOSP PERFORMED    EKG EKG Interpretation  Date/Time:  Tuesday December 11 2020 19:03:30 EDT Ventricular Rate:  58 PR Interval:  168 QRS Duration: 86 QT Interval:  428 QTC Calculation: 421 R Axis:   76 Text Interpretation: Sinus rhythm Probable anteroseptal infarct, old Confirmed by Ronnald Nian, Raquon Milledge ([id number]) on 12/11/2020 7:50:13 PM  Radiology CT Head Wo Contrast  Result Date: 12/11/2020 CLINICAL DATA:  Headache EXAM: CT HEAD WITHOUT CONTRAST TECHNIQUE: Contiguous axial images were obtained from the base of the skull through the vertex without intravenous contrast. COMPARISON:  CT brain 02/19/2020 FINDINGS: Brain: 19 x 20 mm acute hemorrhage within the right caudate/basal ganglia. Moderate acute hemorrhage within the right lateral ventricle with additional hemorrhage in the third and fourth ventricles. The ventricles are mildly enlarged. No midline shift. Mild chronic small vessel ischemic changes of the white matter. Vascular: No hyperdense vessels.  No unexpected calcification Skull: Normal. Negative for fracture or focal lesion. Sinuses/Orbits: No acute finding. Other: None IMPRESSION: 1. Acute hemorrhage within the right caudate/basal ganglia with hemorrhage in the right lateral, third, and fourth ventricles. No midline shift. The ventricles are slightly dilated  as compared with prior CT from 2021. 2. Mild chronic small vessel ischemic change of the white matter. Critical Value/emergent results were  called by telephone at the time of interpretation on 12/11/2020 at 7:45 pm to provider Joneisha Miles , who verbally acknowledged these results. Electronically Signed   By: Donavan Foil M.D.   On: 12/11/2020 19:50    Procedures .Critical Care  Date/Time: 12/11/2020 8:14 PM Performed by: Lennice Sites, DO Authorized by: Lennice Sites, DO   Critical care provider statement:    Critical care time (minutes):  40   Critical care was necessary to treat or prevent imminent or life-threatening deterioration of the following conditions:  CNS failure or compromise   Critical care was time spent personally by me on the following activities:  Blood draw for specimens, development of treatment plan with patient or surrogate, discussions with consultants, discussions with primary provider, evaluation of patient's response to treatment, examination of patient, obtaining history from patient or surrogate, ordering and performing treatments and interventions, ordering and review of radiographic studies, review of old charts, pulse oximetry, ordering and review of laboratory studies and re-evaluation of patient's condition   I assumed direction of critical care for this patient from another provider in my specialty: no     Care discussed with: admitting provider     Medications Ordered in ED Medications  nicardipine (CARDENE) 74m in 0.86% saline 2078mIV infusion (0.1 mg/ml) (5 mg/hr Intravenous New Bag/Given 12/11/20 1953)  sodium chloride 0.9 % bolus 1,000 mL (0 mLs Intravenous Stopped 12/11/20 1958)  prochlorperazine (COMPAZINE) injection 10 mg (10 mg Intravenous Given 12/11/20 1853)  diphenhydrAMINE (BENADRYL) injection 25 mg (25 mg Intravenous Given 12/11/20 1851)  dexamethasone (DECADRON) injection 10 mg (10 mg Intravenous Given 12/11/20 1850)    ED Course  I have  reviewed the triage vital signs and the nursing notes.  Pertinent labs & imaging results that were available during my care of the patient were reviewed by me and considered in my medical decision making (see chart for details).    MDM Rules/Calculators/A&P                          DyJordin VicencioaSchellenbergs a [age]ear old male with history of high blood pressure not compliant with his medications who states that he woke up with bad headache this morning.  Headache for the last 8 to 10 hours.  Has been taking Tylenol with not much improvement.  Has vomited once.  Neurologically he appears intact.  Normal strength and sensation.  Normal speech.  No vision issues.  Headaches are atypical for him.  Blood pressure elevated in the 200s.  Otherwise normal vitals.  Radiology called me on the phone as patient does have acute hemorrhage within the right caudate/basal ganglia with hemorrhage in the right lateral, third, fourth ventricles.  There is no midline shift.  Talked with Dr. BhCurly Shoresith neurology as well as Dr. PoTrenton Gammonith neurosurgery.  Patient was started on IV Cardene drip and blood pressure improved to the 140s over 90.  He was given a headache cocktail prior to CT scan and headache has improved but he still has some pain.  Neurologically he appears intact.  Patient is a little sleepy was suspect that is from headache cocktail of Compazine and Benadryl.  Neuro exam continues to be stable and overall normal.  Patient to be admitted to the neurological ICU under neurology.  He is not on any blood thinners.  No need for any reversals at this time.  Neurosurgery available if needed.  This chart was dictated using voice recognition  software.  Despite best efforts to proofread,  errors can occur which can change the documentation meaning.   Final Clinical Impression(s) / ED Diagnoses Final diagnoses:  Hypertensive emergency  Intracranial bleed [facility])    Rx / DC Orders ED Discharge Orders     None         Lennice Sites, DO 12/11/20 2017

## 2020-12-11 NOTE — ED Notes (Signed)
ED Provider at bedside. 

## 2020-12-11 NOTE — ED Triage Notes (Addendum)
Per EMS-coming from home-states he has been taking tylenol over the last 8 hour-states he woke up with a headache-vomited once with EMS-states he has not taken his BP meds this am-he has been out, unable to afford

## 2020-12-11 NOTE — ED Notes (Signed)
Attempted to call report to [facility] - staff states RN will callback.

## 2020-12-11 NOTE — ED Notes (Signed)
Gave [facility] report and they are on the way to transport patient

## 2020-12-11 NOTE — ED Notes (Signed)
Gave report to [employer] at [facility]

## 2020-12-11 NOTE — ED Notes (Signed)
[facility] at bedside 

## 2020-12-12 ENCOUNTER — Inpatient Hospital Stay (HOSPITAL_COMMUNITY): Payer: 59

## 2020-12-12 DIAGNOSIS — I6389 Other cerebral infarction: Secondary | ICD-10-CM

## 2020-12-12 LAB — URINALYSIS, ROUTINE W REFLEX MICROSCOPIC
Bilirubin Urine: NEGATIVE
Glucose, UA: NEGATIVE mg/dL
Hgb urine dipstick: NEGATIVE
Ketones, ur: 20 mg/dL — AB
Leukocytes,Ua: NEGATIVE
Nitrite: NEGATIVE
Protein, ur: NEGATIVE mg/dL
Specific Gravity, Urine: 1.013 (ref 1.005–1.030)
pH: 5 (ref 5.0–8.0)

## 2020-12-12 LAB — MRSA NEXT GEN BY PCR, NASAL: MRSA by PCR Next Gen: NOT DETECTED

## 2020-12-12 LAB — CBC WITH DIFFERENTIAL/PLATELET
Abs Immature Granulocytes: 0.03 10*3/uL (ref 0.00–0.07)
Basophils Absolute: 0 10*3/uL (ref 0.0–0.1)
Basophils Relative: 0 %
Eosinophils Absolute: 0 10*3/uL (ref 0.0–0.5)
Eosinophils Relative: 0 %
HCT: 42.7 % (ref 39.0–52.0)
Hemoglobin: 13.9 g/dL (ref 13.0–17.0)
Immature Granulocytes: 0 %
Lymphocytes Relative: 8 %
Lymphs Abs: 0.6 10*3/uL — ABNORMAL LOW (ref 0.7–4.0)
MCH: 30.4 pg (ref 26.0–34.0)
MCHC: 32.6 g/dL (ref 30.0–36.0)
MCV: 93.4 fL (ref 80.0–100.0)
Monocytes Absolute: 0.1 10*3/uL (ref 0.1–1.0)
Monocytes Relative: 1 %
Neutro Abs: 7.8 10*3/uL — ABNORMAL HIGH (ref 1.7–7.7)
Neutrophils Relative %: 91 %
Platelets: 215 10*3/uL (ref 150–400)
RBC: 4.57 MIL/uL (ref 4.22–5.81)
RDW: 14.9 % (ref 11.5–15.5)
WBC: 8.6 10*3/uL (ref 4.0–10.5)
nRBC: 0 % (ref 0.0–0.2)

## 2020-12-12 LAB — ECHOCARDIOGRAM COMPLETE
Area-P 1/2: 2.26 cm2
S' Lateral: 2.4 cm

## 2020-12-12 LAB — BASIC METABOLIC PANEL
Anion gap: 11 (ref 5–15)
BUN: 9 mg/dL (ref 6–20)
CO2: 20 mmol/L — ABNORMAL LOW (ref 22–32)
Calcium: 9.9 mg/dL (ref 8.9–10.3)
Chloride: 109 mmol/L (ref 98–111)
Creatinine, Ser: 1.17 mg/dL (ref 0.61–1.24)
GFR, Estimated: >60 mL/min (ref >60)
Glucose, Bld: 146 mg/dL — ABNORMAL HIGH (ref 70–99)
Potassium: 3.6 mmol/L (ref 3.5–5.1)
Sodium: 140 mmol/L (ref 135–145)

## 2020-12-12 LAB — RAPID URINE DRUG SCREEN, HOSP PERFORMED
Amphetamines: NOT DETECTED
Barbiturates: NOT DETECTED
Benzodiazepines: NOT DETECTED
Cocaine: POSITIVE — AB
Opiates: NOT DETECTED
Tetrahydrocannabinol: NOT DETECTED

## 2020-12-12 LAB — GLUCOSE, CAPILLARY
Glucose-Capillary: 107 mg/dL — ABNORMAL HIGH (ref 70–99)
Glucose-Capillary: 132 mg/dL — ABNORMAL HIGH (ref 70–99)
Glucose-Capillary: 142 mg/dL — ABNORMAL HIGH (ref 70–99)
Glucose-Capillary: 153 mg/dL — ABNORMAL HIGH (ref 70–99)
Glucose-Capillary: 155 mg/dL — ABNORMAL HIGH (ref 70–99)
Glucose-Capillary: 159 mg/dL — ABNORMAL HIGH (ref 70–99)

## 2020-12-12 LAB — LIPID PANEL
Cholesterol: 268 mg/dL — ABNORMAL HIGH (ref 0–200)
HDL: 88 mg/dL (ref >40)
LDL Cholesterol: 166 mg/dL — ABNORMAL HIGH (ref 0–99)
Total CHOL/HDL Ratio: 3 RATIO
Triglycerides: 71 mg/dL (ref <150)
VLDL: 14 mg/dL (ref 0–40)

## 2020-12-12 MED ORDER — LOPERAMIDE HCL 2 MG PO CAPS
2.0000 mg | ORAL_CAPSULE | Freq: Two times a day (BID) | ORAL | Status: DC
  Administered 2020-12-12 – 2020-12-13 (×3): 2 mg via ORAL
  Filled 2020-12-12 (×3): qty 1

## 2020-12-12 MED ORDER — VITAMIN B-12 100 MCG PO TABS
100.0000 ug | ORAL_TABLET | Freq: Every day | ORAL | Status: DC
  Administered 2020-12-12 – 2020-12-17 (×6): 100 ug via ORAL
  Filled 2020-12-12 (×6): qty 1

## 2020-12-12 MED ORDER — AMLODIPINE BESYLATE 10 MG PO TABS
10.0000 mg | ORAL_TABLET | Freq: Every day | ORAL | Status: DC
  Administered 2020-12-12 – 2020-12-20 (×9): 10 mg via ORAL
  Filled 2020-12-12 (×9): qty 1

## 2020-12-12 MED ORDER — STROKE: EARLY STAGES OF RECOVERY BOOK: Freq: Once | Status: AC

## 2020-12-12 MED ORDER — SODIUM CHLORIDE 0.9 % IV SOLN: INTRAVENOUS | Status: DC

## 2020-12-12 MED ORDER — CLEVIDIPINE BUTYRATE 0.5 MG/ML IV EMUL
0.0000 mg/h | INTRAVENOUS | Status: DC
  Administered 2020-12-12: 9 mg/h via INTRAVENOUS
  Administered 2020-12-12: 1 mg/h via INTRAVENOUS
  Filled 2020-12-12 (×4): qty 50

## 2020-12-12 MED ORDER — PANTOPRAZOLE SODIUM 40 MG IV SOLR
40.0000 mg | Freq: Every day | INTRAVENOUS | Status: DC
  Administered 2020-12-12: 40 mg via INTRAVENOUS
  Filled 2020-12-12: qty 40

## 2020-12-12 MED ORDER — ADULT MULTIVITAMIN W/MINERALS CH
1.0000 | ORAL_TABLET | Freq: Every day | ORAL | Status: DC
  Administered 2020-12-12 – 2020-12-21 (×10): 1 via ORAL
  Filled 2020-12-12 (×10): qty 1

## 2020-12-12 MED ORDER — WHITE PETROLATUM EX OINT
TOPICAL_OINTMENT | CUTANEOUS | Status: AC
  Administered 2020-12-12: 0.2
  Filled 2020-12-12: qty 28.35

## 2020-12-12 MED ORDER — LORAZEPAM 1 MG PO TABS
2.0000 mg | ORAL_TABLET | Freq: Once | ORAL | Status: AC | PRN
  Administered 2020-12-12: 2 mg via ORAL
  Filled 2020-12-12: qty 2

## 2020-12-12 MED ORDER — HYDRALAZINE HCL 50 MG PO TABS
50.0000 mg | ORAL_TABLET | Freq: Two times a day (BID) | ORAL | Status: DC
  Administered 2020-12-12: 50 mg via ORAL
  Filled 2020-12-12: qty 1

## 2020-12-12 MED ORDER — GADOBUTROL 1 MMOL/ML IV SOLN
9.0000 mL | Freq: Once | INTRAVENOUS | Status: AC | PRN
  Administered 2020-12-12: 9 mL via INTRAVENOUS

## 2020-12-12 MED ORDER — VITAMIN D3 1.25 MG (50000 UT) PO CAPS: 50000.0000 [IU] | ORAL_CAPSULE | ORAL | Status: DC

## 2020-12-12 MED ORDER — ROSUVASTATIN CALCIUM 5 MG PO TABS
10.0000 mg | ORAL_TABLET | ORAL | Status: DC
  Administered 2020-12-12 – 2020-12-13 (×2): 10 mg via ORAL
  Filled 2020-12-12 (×2): qty 2

## 2020-12-12 MED ORDER — EZETIMIBE 10 MG PO TABS
10.0000 mg | ORAL_TABLET | Freq: Every day | ORAL | Status: DC
  Administered 2020-12-12 – 2020-12-17 (×6): 10 mg via ORAL
  Filled 2020-12-12 (×6): qty 1

## 2020-12-12 MED ORDER — INSULIN ASPART 100 UNIT/ML IJ SOLN
0.0000 [IU] | INTRAMUSCULAR | Status: DC
  Administered 2020-12-12 (×2): 1 [IU] via SUBCUTANEOUS
  Administered 2020-12-12 (×3): 2 [IU] via SUBCUTANEOUS
  Administered 2020-12-13: 1 [IU] via SUBCUTANEOUS
  Administered 2020-12-13: 2 [IU] via SUBCUTANEOUS
  Administered 2020-12-14 (×3): 1 [IU] via SUBCUTANEOUS

## 2020-12-12 MED ORDER — LABETALOL HCL 5 MG/ML IV SOLN
5.0000 mg | INTRAVENOUS | Status: DC | PRN
  Filled 2020-12-12: qty 4

## 2020-12-12 MED ORDER — WHITE PETROLATUM EX OINT
TOPICAL_OINTMENT | CUTANEOUS | Status: DC | PRN
  Administered 2020-12-19: 0.2 via TOPICAL
  Filled 2020-12-12: qty 28.35

## 2020-12-12 MED ORDER — RIFAXIMIN 550 MG PO TABS
550.0000 mg | ORAL_TABLET | Freq: Three times a day (TID) | ORAL | Status: DC
  Administered 2020-12-12 – 2020-12-19 (×21): 550 mg via ORAL
  Filled 2020-12-12 (×21): qty 1

## 2020-12-12 MED ORDER — BUTALBITAL-APAP-CAFFEINE 50-325-40 MG PO TABS
1.0000 | ORAL_TABLET | Freq: Three times a day (TID) | ORAL | Status: DC | PRN
  Administered 2020-12-12 – 2020-12-17 (×11): 1 via ORAL
  Filled 2020-12-12 (×11): qty 1

## 2020-12-12 MED ORDER — LOPERAMIDE HCL 2 MG PO TABS: 2.0000 mg | ORAL_TABLET | Freq: Two times a day (BID) | ORAL | Status: DC

## 2020-12-12 MED ORDER — BUDESONIDE 3 MG PO CPEP: 9.0000 mg | ORAL_CAPSULE | Freq: Every day | ORAL | Status: DC

## 2020-12-12 MED ORDER — VITAMIN D (ERGOCALCIFEROL) 1.25 MG (50000 UNIT) PO CAPS
50000.0000 [IU] | ORAL_CAPSULE | ORAL | Status: DC
  Administered 2020-12-12 – 2020-12-19 (×2): 50000 [IU] via ORAL
  Filled 2020-12-12 (×2): qty 1

## 2020-12-12 MED ORDER — PANTOPRAZOLE SODIUM 40 MG PO TBEC
40.0000 mg | DELAYED_RELEASE_TABLET | Freq: Every day | ORAL | Status: DC
  Administered 2020-12-12 – 2020-12-21 (×10): 40 mg via ORAL
  Filled 2020-12-12 (×10): qty 1

## 2020-12-12 MED ORDER — SENNOSIDES-DOCUSATE SODIUM 8.6-50 MG PO TABS: 1.0000 | ORAL_TABLET | Freq: Two times a day (BID) | ORAL | Status: DC

## 2020-12-12 MED ORDER — HYDRALAZINE HCL 50 MG PO TABS
100.0000 mg | ORAL_TABLET | Freq: Two times a day (BID) | ORAL | Status: DC
  Administered 2020-12-12 – 2020-12-20 (×17): 100 mg via ORAL
  Filled 2020-12-12 (×17): qty 2

## 2020-12-12 NOTE — Progress Notes (Signed)
STROKE TEAM PROGRESS NOTE   SUBJECTIVE (INTERVAL HISTORY) His wife is at the bedside.  Overall his condition is stable.  Patient complained headache earlier, was given Tylenol, not effective.  Prescribed Fioricet, per patient it was effective.  Currently he has no headache, no focal deficit.  Still has his diarrhea due to ulcerative colitis.  Per chart, he was on Cipro and vancomycin before but not effective, recently changed to Xifaxan but not able to afford.  Will resume Xifaxan and request Education officer, museum for financial assistance.  Still on Cleviprex for hypertensive emergency, will resume home medication and try to taper off Cleviprex.  MRI and MRA pending.   OBJECTIVE Temp:  [97.8 F (36.6 C)-99.5 F (37.5 C)] 97.8 F (36.6 C) (06/29 1530) Pulse Rate:  [57-114] 91 (06/29 1530) Resp:  [11-28] 20 (06/29 1530) BP: (117-217)/(76-195) 133/99 (06/29 1530) SpO2:  [90 %-100 %] 97 % (06/29 1530)  Recent Labs  Lab 12/12/20 0349 12/12/20 0813 12/12/20 1148 12/12/20 1648  GLUCAP 142* 159* 132* 153*   Recent Labs  Lab 12/11/20 1900 12/12/20 0212  NA 141 140  K 3.9 3.6  CL 108 109  CO2 24 20*  GLUCOSE 125* 146*  BUN 10 9  CREATININE 1.31* 1.17  CALCIUM 10.2 9.9   Recent Labs  Lab 12/11/20 1900  AST 16  ALT 15  ALKPHOS 76  BILITOT 1.0  PROT 8.4*  ALBUMIN 4.5   Recent Labs  Lab 12/11/20 1900 12/12/20 0212  WBC 6.9 8.6  NEUTROABS 5.5 7.8*  HGB 13.4 13.9  HCT 41.9 42.7  MCV 95.4 93.4  PLT 203 215   No results for input(s): CKTOTAL, CKMB, CKMBINDEX, TROPONINI in the last 168 hours. Recent Labs    12/11/20 1900  LABPROT 12.0  INR 0.9   Recent Labs    12/12/20 0324  COLORURINE YELLOW  LABSPEC 1.013  PHURINE 5.0  GLUCOSEU NEGATIVE  HGBUR NEGATIVE  BILIRUBINUR NEGATIVE  KETONESUR 20*  PROTEINUR NEGATIVE  NITRITE NEGATIVE  LEUKOCYTESUR NEGATIVE       Component Value Date/Time   CHOL 268 (H) 12/12/2020 0212   TRIG 71 12/12/2020 0212   HDL 88 12/12/2020  0212   CHOLHDL 3.0 12/12/2020 0212   VLDL 14 12/12/2020 0212   LDLCALC 166 (H) 12/12/2020 0212   No results found for: HGBA1C    Component Value Date/Time   LABOPIA NONE DETECTED 12/12/2020 0324   COCAINSCRNUR POSITIVE (A) 12/12/2020 0324   LABBENZ NONE DETECTED 12/12/2020 0324   AMPHETMU NONE DETECTED 12/12/2020 0324   THCU NONE DETECTED 12/12/2020 0324   LABBARB NONE DETECTED 12/12/2020 0324    No results for input(s): ETH in the last 168 hours.  I have personally reviewed the radiological images below and agree with the radiology interpretations.  CT HEAD WO CONTRAST  Result Date: 12/12/2020 CLINICAL DATA:  Stroke follow-up.  Pupillary asymmetry. EXAM: CT HEAD WITHOUT CONTRAST TECHNIQUE: Contiguous axial images were obtained from the base of the skull through the vertex without intravenous contrast. COMPARISON:  Head CT 12/11/2020 at 7:28 p.m. FINDINGS: Brain: Unchanged appearance of intraparenchymal hematoma centered at the right caudate head. There is extension of hemorrhage into the ventricles with mild communicating hydrocephalus. There is no midline shift. No new site of hemorrhage. Vascular: No abnormal hyperdensity of the major intracranial arteries or dural venous sinuses. No intracranial atherosclerosis. Skull: The visualized skull base, calvarium and extracranial soft tissues are normal. Sinuses/Orbits: No fluid levels or advanced mucosal thickening of the visualized paranasal sinuses.  No mastoid or middle ear effusion. The orbits are normal. IMPRESSION: Unchanged appearance of intraparenchymal hematoma centered at the right caudate head with intraventricular extension and mild communicating hydrocephalus. Electronically Signed   By: Ulyses Jarred M.D.   On: 12/12/2020 00:10   CT Head Wo Contrast  Result Date: 12/11/2020 CLINICAL DATA:  Headache EXAM: CT HEAD WITHOUT CONTRAST TECHNIQUE: Contiguous axial images were obtained from the base of the skull through the vertex without  intravenous contrast. COMPARISON:  CT brain 02/19/2020 FINDINGS: Brain: 19 x 20 mm acute hemorrhage within the right caudate/basal ganglia. Moderate acute hemorrhage within the right lateral ventricle with additional hemorrhage in the third and fourth ventricles. The ventricles are mildly enlarged. No midline shift. Mild chronic small vessel ischemic changes of the white matter. Vascular: No hyperdense vessels.  No unexpected calcification Skull: Normal. Negative for fracture or focal lesion. Sinuses/Orbits: No acute finding. Other: None IMPRESSION: 1. Acute hemorrhage within the right caudate/basal ganglia with hemorrhage in the right lateral, third, and fourth ventricles. No midline shift. The ventricles are slightly dilated as compared with prior CT from 2021. 2. Mild chronic small vessel ischemic change of the white matter. Critical Value/emergent results were called by telephone at the time of interpretation on 12/11/2020 at 7:45 pm to provider ADAM CURATOLO , who verbally acknowledged these results. Electronically Signed   By: Donavan Foil M.D.   On: 12/11/2020 19:50   ECHOCARDIOGRAM COMPLETE  Result Date: 12/12/2020    ECHOCARDIOGRAM REPORT   Patient Name:   Matthew Francis Date of Exam: 12/12/2020 Medical Rec #:  [id number]     Height:       70.5 in Accession #:    [id number]    Weight:       238.1 lb Date of Birth:  1960-11-23     BSA:          2.260 m Patient Age:    60 years      BP:           138/94 mmHg Patient Gender: M             HR:           95 bpm. Exam Location:  Inpatient Procedure: 2D Echo, Color Doppler and Cardiac Doppler Indications:    Stroke I63.9  History:        Patient has no prior history of Echocardiogram examinations.                 Risk Factors:Dyslipidemia and Hypertension.  Sonographer:    Bernadene Person RDCS Referring Phys: [id number] [place]  1. Left ventricular ejection fraction, by estimation, is 60 to 65%. The left ventricle has normal function. The left  ventricle has no regional wall motion abnormalities. Left ventricular diastolic parameters are consistent with Grade I diastolic dysfunction (impaired relaxation).  2. Right ventricular systolic function is normal. The right ventricular size is normal. There is normal pulmonary artery systolic pressure. The estimated right ventricular systolic pressure is 00.8 mmHg.  3. The mitral valve is normal in structure. Trivial mitral valve regurgitation. No evidence of mitral stenosis.  4. The aortic valve is normal in structure. Aortic valve regurgitation is not visualized. No aortic stenosis is present.  5. The inferior vena cava is normal in size with greater than 50% respiratory variability, suggesting right atrial pressure of 3 mmHg. Conclusion(s)/Recommendation(s): No intracardiac source of embolism detected on this transthoracic study. A transesophageal echocardiogram is recommended to exclude cardiac source  of embolism if clinically indicated. FINDINGS  Left Ventricle: Left ventricular ejection fraction, by estimation, is 60 to 65%. The left ventricle has normal function. The left ventricle has no regional wall motion abnormalities. The left ventricular internal cavity size was normal in size. There is  borderline left ventricular hypertrophy. Left ventricular diastolic parameters are consistent with Grade I diastolic dysfunction (impaired relaxation). Right Ventricle: The right ventricular size is normal. No increase in right ventricular wall thickness. Right ventricular systolic function is normal. There is normal pulmonary artery systolic pressure. The tricuspid regurgitant velocity is 2.04 m/s, and  with an assumed right atrial pressure of 3 mmHg, the estimated right ventricular systolic pressure is 23.5 mmHg. Left Atrium: Left atrial size was normal in size. Right Atrium: Right atrial size was normal in size. Pericardium: There is no evidence of pericardial effusion. Mitral Valve: The mitral valve is normal in  structure. Trivial mitral valve regurgitation. No evidence of mitral valve stenosis. Tricuspid Valve: The tricuspid valve is normal in structure. Tricuspid valve regurgitation is not demonstrated. No evidence of tricuspid stenosis. Aortic Valve: The aortic valve is normal in structure. Aortic valve regurgitation is not visualized. No aortic stenosis is present. Pulmonic Valve: The pulmonic valve was normal in structure. Pulmonic valve regurgitation is mild. No evidence of pulmonic stenosis. Aorta: The aortic root is normal in size and structure. Venous: The inferior vena cava is normal in size with greater than 50% respiratory variability, suggesting right atrial pressure of 3 mmHg. IAS/Shunts: No atrial level shunt detected by color flow Doppler.  LEFT VENTRICLE PLAX 2D LVIDd:         4.20 cm  Diastology LVIDs:         2.40 cm  LV e' medial:    5.78 cm/s LV PW:         1.10 cm  LV E/e' medial:  9.8 LV IVS:        0.90 cm  LV e' lateral:   10.20 cm/s LVOT diam:     2.00 cm  LV E/e' lateral: 5.5 LV SV:         65 LV SV Index:   29 LVOT Area:     3.14 cm  RIGHT VENTRICLE RV S prime:     14.30 cm/s TAPSE (M-mode): 1.6 cm LEFT ATRIUM             Index       RIGHT ATRIUM           Index LA diam:        3.50 cm 1.55 cm/m  RA Area:     13.90 cm LA Vol (A2C):   51.3 ml 22.70 ml/m RA Volume:   35.30 ml  15.62 ml/m LA Vol (A4C):   57.2 ml 25.31 ml/m LA Biplane Vol: 59.3 ml 26.24 ml/m  AORTIC VALVE LVOT Vmax:   124.00 cm/s LVOT Vmean:  85.300 cm/s LVOT VTI:    0.206 m  AORTA Ao Root diam: 3.30 cm Ao Asc diam:  3.30 cm MITRAL VALVE               TRICUSPID VALVE MV Area (PHT): 2.26 cm    TR Peak grad:   16.6 mmHg MV Decel Time: 335 msec    TR Vmax:        204.00 cm/s MV E velocity: 56.40 cm/s MV A velocity: 62.90 cm/s  SHUNTS MV E/A ratio:  0.90        Systemic VTI:  0.21 m  Systemic Diam: 2.00 cm Cherlynn Kaiser MD Electronically signed by Cherlynn Kaiser MD Signature Date/Time:  12/12/2020/4:46:34 PM    Final      PHYSICAL EXAM  Temp:  [97.8 F (36.6 C)-99.5 F (37.5 C)] 97.8 F (36.6 C) (06/29 1530) Pulse Rate:  [57-114] 91 (06/29 1530) Resp:  [11-28] 20 (06/29 1530) BP: (117-217)/(76-195) 133/99 (06/29 1530) SpO2:  [90 %-100 %] 97 % (06/29 1530)  General - Well nourished, well developed, in no apparent distress.  Ophthalmologic - fundi not visualized due to noncooperation.  Cardiovascular - Regular rhythm and rate.  Mental Status -  Level of arousal and orientation to time, place, and person were intact. Language including expression, naming, repetition, comprehension was assessed and found intact. Fund of Knowledge was assessed and was intact.  Cranial Nerves II - XII - II - Visual field intact OU. III, IV, VI - Extraocular movements intact. V - Facial sensation intact bilaterally. VII - Facial movement intact bilaterally. VIII - Hearing & vestibular intact bilaterally. X - Palate elevates symmetrically. XI - Chin turning & shoulder shrug intact bilaterally. XII - Tongue protrusion intact.  Motor Strength - The patient's strength was normal in all extremities and pronator drift was absent.  Bulk was normal and fasciculations were absent.   Motor Tone - Muscle tone was assessed at the neck and appendages and was normal.  Reflexes - The patient's reflexes were symmetrical in all extremities and he had no pathological reflexes.  Sensory - Light touch, temperature/pinprick were assessed and were symmetrical.    Coordination - The patient had normal movements in the hands with no ataxia or dysmetria.  Tremor was absent.  Gait and Station - deferred.   ASSESSMENT/PLAN Matthew Francis is a 60 y.o. male with history of ulcerative colitis, HTN, HLD, DM, CKD, lone A. fib admitted for headache, confusion, nausea vomiting. No tPA given due to [place].    ICH:  right caudate head ICH with IVH likely secondary to hypertensive emergency and small vessel  disease source CT right caudate head ICH with IVH. CT head repeat stable hematoma and ventricular size MRI with and without contrast pending MRA pending Carotid Doppler pending 2D Echo EF 60 to 65% LDL 166 HgbA1c pending SCDs for VTE prophylaxis No antithrombotic prior to admission, now on No antithrombotic given ICH Patient counseled to be compliant with his antithrombotic medications Ongoing aggressive stroke risk factor management Therapy recommendations:  pending  Disposition:  pending   IVH CT showed IVH right lateral ventricle, third ventricle and partial fourth ventricle No hydrocephalus seen Need continued monitoring to rule out hydrocephalus  Ulcerative colitis Frequent diarrhea Follows with GI at [facility] Recently prescribed Xifaxan but not able to afford the cost Restart Xifaxan this time, asking SW for financial support On loperamide  Diabetes HgbA1c pending goal < 7.0 Controlled CBG monitoring SSI DM education and close PCP follow up  Hypertension Home meds: Hydralazine, Norvasc and atenolol Stable on Cleviprex BP goal less than 160 Taper off Cleviprex as able On hydralazine and Norvasc Long term BP goal normotensive  Hyperlipidemia Home meds: Crestor 10 every other day and Zetia LDL 166, goal < 70 Now on Crestor 10 every other day and Zetia - will discuss with pt to see if can increase crestor dose Continue statin and Zetia at discharge  Cocaine abuse UDS positive for cocaine Avoid selective beta-blockers including metoprolol, atenolol Cessation education provided Patient is willing to quit  Other Stroke Risk Factors Obesity   Other  Active Problems Lone A. fib in 01/2020 in the setting of sepsis AKI, creatinine 1.31-> 1.17  Hospital day # 1  This patient is critically ill due to [place], IVH, hypertensive emergency, ulcerative colitis, hyperlipidemia, cocaine use and at significant risk of neurological worsening, death form hematoma expansion,  cerebral edema, brain herniation, obstructive hydrocephalus, hypertensive encephalopathy, cocaine withdrawal. This patient's care requires constant monitoring of vital signs, hemodynamics, respiratory and cardiac monitoring, review of multiple databases, neurological assessment, discussion with family, other specialists and medical decision making of high complexity. I spent 40 minutes of neurocritical care time in the care of this patient. I had long discussion with patient at bedside, updated pt current condition, treatment plan and potential prognosis, and answered all the questions.  He expressed understanding and appreciation.  I also discussed with pharmacy and social worker.   Rosalin Hawking, MD PhD Stroke Neurology 12/12/2020 4:59 PM    To contact Stroke Continuity provider, please refer to [email or web address] After hours, contact General Neurology

## 2020-12-12 NOTE — Progress Notes (Signed)
Attempted to scan this patient's Brain MRI. Pt was unable to even start the scan and stated that he was "about to freak out" because of the head coil. He then stated that he would need more medication in order to go through with the scan. We decided to try the scan at a later time.

## 2020-12-12 NOTE — H&P (Addendum)
Neurology H&P  CC: Headache, nausea and vomitting  History is obtained from: Patient and chart review   HPI: Matthew Francis is a 60 y.o. male with a past medical history significant for ulcerative colitis, diabetes, hypertension, hyperlipidemia, chronic kidney disease, a single episode of prior paroxysmal atrial fibrillation, obesity (BMI 28).  He was in his usual state of health yesterday watching TV when around 10 or 11 PM he started to have a severe headache.  This prevented him from sleeping all night due to the severe throbbing pain.  It would improve slightly with Tylenol, he was taking 2 tabs of 500 mg at a time and estimates he took it 3 or 4 times although he did also begin to get confused and disoriented.  He was also having nausea and vomiting which she treated with a fruity over-the-counter home remedy without much improvement.  Given worsening of his headache today at around 1 or 2 PM with inability to stand he decided to present for further evaluation.  He notes he has been out of his blood pressure medications for a couple of days but otherwise has been taking most of his medications.  When head CT revealed right caudate head intracerebral hemorrhage with intraventricular extension neurology was consulted for further management.  Regarding his history of atrial fibrillation, this was in the setting of sepsis in August 2021.  Given no other known episodes of atrial fibrillation he has not been on anticoagulation per cardiology  LKW: 10 PM on 12/10/2020 tPA given?: No, intracerebral hemorrhage Premorbid modified rankin scale: 0-1     0 - No symptoms.     1 - No significant disability. Able to carry out all usual activities, despite some symptoms. ICH Score: 1  Time performed: 11 PM 6/28 GCS: 13-15 is 0 points Infratentorial: No.. If yes, 1 point -- 0  Volume: <30cc is 0 points  Age: 60 y.o.. >80 is 1 point -- 0 Intraventricular extension is 1 point -- 01 A Score of 1 points has a  30 day mortality of 13%. Stroke. 2001 Apr;32(4):891-7.  ROS: All other review of systems was negative except as noted in the HPI, with some limitation that he was slightly disoriented, for example reporting that his symptoms started Saturday night at 1 point but then reiterating that they had only been going on for 1 day prior to his presentation  Past Medical History:  Diagnosis Date   Arthritis    GERD (gastroesophageal reflux disease)    H/O ulcerative colitis    Hyperlipidemia    Hypertension    Inguinal hernia    Ulcer    ulcerative colitis   Ulcerative colitis    Wears glasses    Past Surgical History:  Procedure Laterality Date   APPENDECTOMY     COLECTOMY     Proctocolectomy with IPAA   COLECTOMY     FOREIGN BODY REMOVAL ABDOMINAL  02/17/2012   Procedure: REMOVAL FOREIGN BODY ABDOMINAL;  Surgeon: Odis Hollingshead, MD;  Location: [facility];  Service: General;  Laterality: N/A;  abdominal wound exploration and removal of foreign body   HERNIA REPAIR     Multiple incisional hernias.   ileoanal anastomosis       Family History  Problem Relation Age of Onset   Cancer Sister    Social History:  reports that he has never smoked. He has never used smokeless tobacco. He reports current alcohol use of about 4.0 standard drinks of alcohol per week.  He reports that he does not use drugs.  He does endorse a remote history of opiate use disorder but notes he had no trouble discontinuing use once family confronted him with her concerns  Exam: Current vital signs: BP (!) 140/94   Pulse 99   Temp 99.5 F (37.5 C) (Oral)   Resp 14   SpO2 92%  Vital signs in last 24 hours: Temp:  [98.9 F (37.2 C)-99.5 F (37.5 C)] 99.5 F (37.5 C) (06/29 0000) Pulse Rate:  [57-106] 99 (06/29 0000) Resp:  [14-27] 14 (06/29 0000) BP: (139-217)/(85-195) 140/94 (06/29 0000) SpO2:  [90 %-100 %] 92 % (06/29 0000)   Physical Exam  Constitutional: Appears well-developed and  well-nourished.  Psych: Affect appropriate to situation, calm and cooperative, mildly frustrated at times Eyes: No scleral injection HENT: No oropharyngeal obstruction.  MSK: no joint deformities.  Cardiovascular: Normal rate and regular rhythm.  Respiratory: Effort normal, non-labored breathing GI: Soft.  No distension. There is no tenderness.  Skin: Warm dry and intact visible skin  Neuro: Mental Status: Patient is awake, alert, oriented to person, place, month, year, and situation, although not fully oriented to day of the week Patient is able to give a clear and coherent history as documented above. No signs of aphasia or neglect. Cranial Nerves: II: Visual Fields are full. Pupils are round, and reactive to light, however the right pupil is 1 mm larger than the left with penlight stimulation III,IV, VI: EOMI without ptosis or diploplia.  V: Facial sensation is symmetric to temperature VII: Facial movement is symmetric.  VIII: hearing is intact to voice X: Uvula elevates symmetrically XI: Shoulder shrug is symmetric. XII: tongue is midline without atrophy or fasciculations.  Motor: Tone is normal. Bulk is normal. 5/5 strength was present in all four extremities.  Sensory: Sensation is symmetric to light touch in the arms and legs. Deep Tendon Reflexes: 3+ and symmetric in the biceps and patellae.  Plantars: Toes are downgoing bilaterally.  Cerebellar: FNF and HKS are intact bilaterally  NIHSS total 0  I have reviewed labs in epic and the results pertinent to this consultation are:  Baseline creatinine is 1.1 (05/02/2020, Care Everywhere), 1.31 on admission today Glucose 125 CBC within normal limits PT/INR within normal limits COVID/flu negative, MRSA negative  I have reviewed the images obtained: Head CT with right caudate parenchymal hemorrhage likely hypertensive based on location, with extension into the ventricles including casting of the third ventricle and mild  to moderate new hydrocephalus from prior scan.  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 Trenton Gammon aware of patient per ED team -PT/OT/ST  -neuromonitoring  CNS Cerebral edema Mild compression of brain -Holding on hyperosmolar therapy, will initiate if there is a decline to maximize the effectiveness of the treatment once/if it is needed -MRI brain, MRA when able, did not obtain CTA overnight given iodine contrast allergy -ED discussed with NSGY, will formally consult if there is a decline -Close neuro monitoring  Obstructive hydrocephalus -Stable on repeat imaging, will continue to  monitor clinically for now and reach out to neurosurgery for EVD if there is a decline -NPO overnight, will consider diet if patient remains stable -ST -Advance diet as tolerated -PT/OT/ST  RESP Currently protecting airway  CV Essential (primary) hypertension Hypertensive Emergency -Aggressive BP control, goal SBP <140 -Holding oral agents for now given patient is n.p.o. pending clinical stability, resume home meds as able -TTE  Paroxysmal atrial -fibrillation Not on anticoagulation per cardiology given this was only one episode in the setting of severe sepsis -Telemetry  GI/GU No active issues -Gentle hydration (0.9% sodium chloride at 50 cc/h)  HEME No active issues -Monitor -transfuse for hgb < 7  ENDO Type 2  diabetes mellitus w/o complications . -SSI, every 4 hours while NPO -goal HgbA1c < 7  Fluid/Electrolyte Disorders No active issues -Replete electrolytes as needed -Trend  ID No active issues  Nutrition E66.9 Obesity  E46 Protein-Calorie Malnutrition Mild Moderate Severe -diet consult  Prophylaxis DVT: SCDs GI: Protonix Bowel: Senna (holding home Imodium for now)  Dispo: Pending clinical stability  Diet: NPO until cleared by speech  Code Status: Full Code     THE FOLLOWING WERE PRESENT ON ADMISSION: CNS -  Cerebral Edema causing mild midline shift, Mild Hydrocephalus, intracerebral hemorrhage, intraventricular hemorrhage  Lesleigh Noe MD-PhD [facility] [phone] Available 7 PM to 7 AM, outside of these hours please call Neurologist on call as listed on Amion.  Total critical care time: 90 minutes   Critical care time was exclusive of separately billable procedures and treating other patients.   Critical care was necessary to treat or prevent imminent or life-threatening deterioration.   Critical care was time spent personally by me on the following activities: development of treatment plan with patient and/or surrogate as well as nursing, discussions with consultants/primary team, evaluation of patient's response to treatment, examination of patient, obtaining history from patient or surrogate, ordering and performing treatments and interventions, ordering and review of laboratory studies, ordering and review of radiographic studies, and re-evaluation of patient's condition as needed, as documented above.

## 2020-12-12 NOTE — Care Management (Signed)
CM consult for medication assistance: Patient is in need of Xifaxin, but unfortunately insurance is reluctant to cover med.  Per patient, his GI doctor is working with insurance to appeal this. Patient states he has trouble paying for other meds at times, stating that "if you miss one payment, they cut off your pharmacy benefits." Encouraged patient to use Good Rx card (he states he has two of these) to assist with copays.  Patient given Xifaxin contact number to call, as they may be able to offer assistance with med.  ([phone]).  He states he will call and is appreciative of my visit.    Reinaldo Raddle, RN, BSN  Trauma/Neuro ICU Case Manager [phone]

## 2020-12-12 NOTE — Progress Notes (Signed)
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   Billey Chang, OTR/L  Acute Rehabilitation Services Pager: [phone] Office: [phone] .  12/12/2020, 7:04 AM

## 2020-12-12 NOTE — Progress Notes (Signed)
PT Cancellation Note  Patient Details Name: Matthew Francis MRN: [id number] DOB: 1960/09/01   Cancelled Treatment:    Reason Eval/Treat Not Completed: Patient not medically ready. 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    Moishe Spice, PT, DPT Acute Rehabilitation Services  Pager: [phone] Office: [place] 12/12/2020, 7:43 AM

## 2020-12-12 NOTE — Progress Notes (Signed)
  Echocardiogram 2D Echocardiogram has been performed.  Fidel Levy 12/12/2020, 2:32 PM

## 2020-12-13 ENCOUNTER — Inpatient Hospital Stay (HOSPITAL_COMMUNITY): Payer: 59

## 2020-12-13 DIAGNOSIS — K51919 Ulcerative colitis, unspecified with unspecified complications: Secondary | ICD-10-CM

## 2020-12-13 DIAGNOSIS — I629 Nontraumatic intracranial hemorrhage, unspecified: Secondary | ICD-10-CM

## 2020-12-13 DIAGNOSIS — N1831 Chronic kidney disease, stage 3a: Secondary | ICD-10-CM

## 2020-12-13 DIAGNOSIS — E7800 Pure hypercholesterolemia, unspecified: Secondary | ICD-10-CM

## 2020-12-13 LAB — CBC WITH DIFFERENTIAL/PLATELET
Abs Immature Granulocytes: 0.03 10*3/uL (ref 0.00–0.07)
Basophils Absolute: 0 10*3/uL (ref 0.0–0.1)
Basophils Relative: 0 %
Eosinophils Absolute: 0.1 10*3/uL (ref 0.0–0.5)
Eosinophils Relative: 1 %
HCT: 41.3 % (ref 39.0–52.0)
Hemoglobin: 13.5 g/dL (ref 13.0–17.0)
Immature Granulocytes: 0 %
Lymphocytes Relative: 32 %
Lymphs Abs: 2.7 10*3/uL (ref 0.7–4.0)
MCH: 30.8 pg (ref 26.0–34.0)
MCHC: 32.7 g/dL (ref 30.0–36.0)
MCV: 94.3 fL (ref 80.0–100.0)
Monocytes Absolute: 0.8 10*3/uL (ref 0.1–1.0)
Monocytes Relative: 9 %
Neutro Abs: 4.7 10*3/uL (ref 1.7–7.7)
Neutrophils Relative %: 58 %
Platelets: 207 10*3/uL (ref 150–400)
RBC: 4.38 MIL/uL (ref 4.22–5.81)
RDW: 15.1 % (ref 11.5–15.5)
WBC: 8.3 10*3/uL (ref 4.0–10.5)
nRBC: 0 % (ref 0.0–0.2)

## 2020-12-13 LAB — GLUCOSE, CAPILLARY
Glucose-Capillary: 110 mg/dL — ABNORMAL HIGH (ref 70–99)
Glucose-Capillary: 107 mg/dL — ABNORMAL HIGH (ref 70–99)
Glucose-Capillary: 198 mg/dL — ABNORMAL HIGH (ref 70–99)
Glucose-Capillary: 150 mg/dL — ABNORMAL HIGH (ref 70–99)
Glucose-Capillary: 104 mg/dL — ABNORMAL HIGH (ref 70–99)

## 2020-12-13 LAB — BASIC METABOLIC PANEL
Anion gap: 8 (ref 5–15)
BUN: 17 mg/dL (ref 6–20)
CO2: 25 mmol/L (ref 22–32)
Calcium: 10.2 mg/dL (ref 8.9–10.3)
Chloride: 107 mmol/L (ref 98–111)
Creatinine, Ser: 1.5 mg/dL — ABNORMAL HIGH (ref 0.61–1.24)
GFR, Estimated: 54 mL/min — ABNORMAL LOW (ref >60)
Glucose, Bld: 100 mg/dL — ABNORMAL HIGH (ref 70–99)
Potassium: 3.9 mmol/L (ref 3.5–5.1)
Sodium: 140 mmol/L (ref 135–145)

## 2020-12-13 LAB — HEMOGLOBIN A1C
Hgb A1c MFr Bld: 5.9 % — ABNORMAL HIGH (ref 4.8–5.6)
Mean Plasma Glucose: 123 mg/dL

## 2020-12-13 MED ORDER — PROCHLORPERAZINE EDISYLATE 10 MG/2ML IJ SOLN
10.0000 mg | Freq: Once | INTRAMUSCULAR | Status: AC
  Administered 2020-12-13: 10 mg via INTRAVENOUS
  Filled 2020-12-13: qty 2

## 2020-12-13 MED ORDER — PSYLLIUM 95 % PO PACK
1.0000 | PACK | Freq: Every day | ORAL | Status: DC
  Administered 2020-12-13 – 2020-12-16 (×4): 1 via ORAL
  Filled 2020-12-13 (×5): qty 1

## 2020-12-13 MED ORDER — ROSUVASTATIN CALCIUM 20 MG PO TABS
20.0000 mg | ORAL_TABLET | Freq: Every day | ORAL | Status: DC
  Administered 2020-12-14 – 2020-12-17 (×4): 20 mg via ORAL
  Filled 2020-12-13 (×4): qty 1

## 2020-12-13 MED ORDER — SODIUM CHLORIDE 0.9 % IV BOLUS
500.0000 mL | Freq: Once | INTRAVENOUS | Status: AC
  Administered 2020-12-13: 500 mL via INTRAVENOUS

## 2020-12-13 MED ORDER — MAGNESIUM SULFATE 2 GM/50ML IV SOLN
2.0000 g | Freq: Once | INTRAVENOUS | Status: AC
  Administered 2020-12-13: 2 g via INTRAVENOUS
  Filled 2020-12-13: qty 50

## 2020-12-13 MED ORDER — LOPERAMIDE HCL 2 MG PO CAPS
2.0000 mg | ORAL_CAPSULE | ORAL | Status: DC | PRN
  Administered 2020-12-13: 4 mg via ORAL
  Administered 2020-12-15 – 2020-12-16 (×4): 2 mg via ORAL
  Filled 2020-12-13 (×11): qty 1

## 2020-12-13 MED ORDER — SODIUM CHLORIDE 0.9 % IV SOLN
25.0000 mg | Freq: Once | INTRAVENOUS | Status: AC
  Administered 2020-12-13: 25 mg via INTRAVENOUS
  Filled 2020-12-13: qty 0.5

## 2020-12-13 NOTE — Evaluation (Signed)
SLP Cancellation Note  Patient Details Name: Matthew Francis MRN: [id number] DOB: 1961/01/18   Cancelled treatment:       Reason Eval/Treat Not Completed: Other (comment) (pt currently with another staff member, will continue efforts)  Kathleen Lime, MS [facility] SLP [place] Office [phone] Pager [phone]  [name] 12/13/2020, 11:56 AM

## 2020-12-13 NOTE — Progress Notes (Signed)
Pt with complaints of excess gas and "hot" stool. Requests increased dose of immodium and addition of metamucil as this has worked in the past with his ulcerative colitis. Contacted Dr. Erlinda Hong and Dr. Alfredia Ferguson who placed new orders.

## 2020-12-13 NOTE — Progress Notes (Signed)
Carotid duplex has been completed.   Preliminary results in CV Proc.   Abram Sander 12/13/2020 10:10 AM

## 2020-12-13 NOTE — TOC CAGE-AID Note (Signed)
Transition of Care [facility]) - CAGE-AID Screening   Patient Details  Name: [name] MRN: [id number] Date of Birth: [birth year]  [facility] CM/SW Contact:    Benard Halsted, LCSW Phone Number: 12/13/2020, 3:16 PM   Clinical Narrative: Patient accepting of substance use resources but prefers information to be kept anonymous. CSW provided supportive listening.    CAGE-AID Screening:    Have You Ever Felt You Ought to Cut Down on Your Drinking or Drug Use?: Yes Have People Annoyed You By Critizing Your Drinking Or Drug Use?: Yes Have You Felt Bad Or Guilty About Your Drinking Or Drug Use?: Yes Have You Ever Had a Drink or Used Drugs First Thing In The Morning to Steady Your Nerves or to Get Rid of a Hangover?: No CAGE-AID Score: 3  Substance Abuse Education Offered: Yes  Substance abuse interventions: Scientist, clinical (histocompatibility and immunogenetics)

## 2020-12-13 NOTE — Plan of Care (Addendum)
Notified by nursing of acute worsening of headache unresponsive to Fioricet and Tylenol -Stat head CT ordered, stable from prior, personally reviewed  -One time benadryl 25 mg, will avoid opiates due to substance use history   Additionally the patient is reporting bilateral lower extremity throbbing pain but is ambulatory without swelling -Will continue to monitor   Addendum: Per nursing report patient stated his headache was unchanged with Benadryl and was requesting opiates noting that he takes Percocet and Dilaudid at home, reportedly prescribed by his GI doctor.  Per review of PDMP with pharmacy assistance, patient has not filled tramadol since 2020 and there are no other opiate prescriptions that are more recent.  He additionally had family members call the unit on his behalf who were verbally aggressive with staff.  Query whether some of his reported diarrhea may be secondary to opiate withdrawal, with opiates obtained illicitly.  Administered 10 mg Compazine, 2 mg magnesium and 1 L IV fluid for additional non-opiate headache control, certainly he may be having rebound headaches from opiate withdrawal in addition to headache secondary to his intracerebral hemorrhage.  COWS scoring also instituted.  Patient sleeping peacefully at about 2 AM when I was able to stop by.  Lesleigh Noe MD-PhD [facility] [phone] Available 7 PM to 7 AM, outside of these hours please call Neurologist on call as listed on Amion.

## 2020-12-13 NOTE — Progress Notes (Signed)
PROGRESS NOTE    Matthew Francis  [id number] DOB: Jun 12, 1961 DOA: 12/11/2020 PCP: Charline Bills, MD   Brief Narrative:  The patient is a 60 year old African-American male with a past medical history significant for but not limited to ulcerative colitis, diabetes mellitus type 2, hypertension, hyperlipidemia, chronic kidney disease stage III A as well as history of PAF who presented to the hospital with a severe headache.  It prevented him from sleeping all night due to severe abdominal pain and improved slightly with Tylenol.  He has had nausea vomiting and because his headache worsened he came to the ED for further evaluation.  Head CT was done and showed a right caudate head intracerebral hemorrhage with intermittent ventricular extension.  Neurology was consulted for further management and he was admitted to the ICU.  Assessment & Plan:   Active Problems:   ICH (intracerebral hemorrhage) ([facility])  ICH -Patient has a right caudate head ICH with IVH likely secondary to hypertensive emergency and small vessel disease in the setting of cocaine abuse -Repeat head CT done and stable and neurology planning on repeating it tomorrow on 12/14/2020 -MRI done and MRI done -Carotid Doppler unremarkable -Transthoracic echo done and showed a normal EF of 60 to 65% -Patient had elevated lipid panel and LDL was 166 and hemoglobin A1c showed that he is prediabetic range now with a hemoglobin A1c of 5.9. -As of his intracranial hemorrhage no antithrombotic was given given his ICH and they are using SCDs for DVT prophylaxis -Patient was given stroke risk factors and neurology is following and PT recommending no follow-up but OT recommending drug rehab -Patient's headache was abated by Fioricet  IVH -Patient had a head CT which showed intraventricular hemorrhage of the right lateral ventricle, third ventricle and partial fourth ventricle -MRI showed no hydrocephalus and neurology planning on repeating a CT in  the a.m.  Ulcerative Colitis -Currently on as needed loperamide and his Xifaxan has been restarted and patient -He will need to follow-up with GI clinic at [facility] at D/C  Diabetes Mellitus Type 2 -Recent HbA1c is 5.9 -Continue SSI and CBG Monitoring   CKD Stage 3a -Mild and Slightly worsening -Patient's BUN/Cr went from 9/1.17 -> 17/1.50 and slightly worsened in the setting of his diarrhea -Avoid nephrotoxic medications, contrast dyes, hypotension and renally dose medications -Continue monitor and trend renal function panel repeat CMP in a.m.  HTN -He is now off of the Cleviprex drip and p.o. antihypertensives been resumed including hydralazine, Norvasc, and atenolol -Blood pressure goal is less than 160  HLD -Lipid panel done and showed a total cholesterol/HDL ratio of 3.0, cholesterol level 268, HDL of 88, LDL 166, triglycerides of 71, VLDL 14 -He is on Crestor every other day 10 mg but now neurology is made this scheduled daily 20 mg and recommending continuing Zetia  Cocaine Abuse -UDS was positive for cocaine -Neurology recommending avoiding selective beta-blockers including metoprolol and atenolol -Counseling given and patient is willing to quit and OT is recommended drug rehab and so we will get social work to provide resources  Obesity -Complicates overall prognosis and care -Estimated body mass index is 33.68 kg/m as calculated from the following:   Height as of 02/19/20: 5' 10.5" (1.791 m).   Weight as of 02/19/20: 108 kg. -Weight Loss and Dietary Counseling given    DVT prophylaxis: SCDs Code Status: FULL CODE Family Communication: No family present at bedside  Disposition Plan: Likely transfer to the neuro floor and repeat head CT in  the a.m. per neurology  Status is: Inpatient  Remains inpatient appropriate because:Unsafe d/c plan, IV treatments appropriate due to intensity of illness or inability to take PO, and Inpatient level of care appropriate due to severity  of illness  Dispo: The patient is from: Home              Anticipated d/c is to: Home              Patient currently is not medically stable to d/c.   Difficult to place patient No  Consultants:  Neurology   Procedures:  CAROTID DUPLEX Summary:  Right Carotid: Velocities in the right ICA are consistent with a 1-39%  stenosis.   Left Carotid: Velocities in the left ICA are consistent with a 1-39%  stenosis.   Vertebrals: Bilateral vertebral arteries demonstrate antegrade flow.   ECHOCARDIOGRAM IMPRESSIONS     1. Left ventricular ejection fraction, by estimation, is 60 to 65%. The  left ventricle has normal function. The left ventricle has no regional  wall motion abnormalities. Left ventricular diastolic parameters are  consistent with Grade I diastolic  dysfunction (impaired relaxation).   2. Right ventricular systolic function is normal. The right ventricular  size is normal. There is normal pulmonary artery systolic pressure. The  estimated right ventricular systolic pressure is 55.7 mmHg.   3. The mitral valve is normal in structure. Trivial mitral valve  regurgitation. No evidence of mitral stenosis.   4. The aortic valve is normal in structure. Aortic valve regurgitation is  not visualized. No aortic stenosis is present.   5. The inferior vena cava is normal in size with greater than 50%  respiratory variability, suggesting right atrial pressure of 3 mmHg.   Conclusion(s)/Recommendation(s): No intracardiac source of embolism  detected on this transthoracic study. A transesophageal echocardiogram is  recommended to exclude cardiac source of embolism if clinically indicated.   FINDINGS   Left Ventricle: Left ventricular ejection fraction, by estimation, is 60  to 65%. The left ventricle has normal function. The left ventricle has no  regional wall motion abnormalities. The left ventricular internal cavity  size was normal in size. There is   borderline left  ventricular hypertrophy. Left ventricular diastolic  parameters are consistent with Grade I diastolic dysfunction (impaired  relaxation).   Right Ventricle: The right ventricular size is normal. No increase in  right ventricular wall thickness. Right ventricular systolic function is  normal. There is normal pulmonary artery systolic pressure. The tricuspid  regurgitant velocity is 2.04 m/s, and   with an assumed right atrial pressure of 3 mmHg, the estimated right  ventricular systolic pressure is 32.2 mmHg.   Left Atrium: Left atrial size was normal in size.   Right Atrium: Right atrial size was normal in size.   Pericardium: There is no evidence of pericardial effusion.   Mitral Valve: The mitral valve is normal in structure. Trivial mitral  valve regurgitation. No evidence of mitral valve stenosis.   Tricuspid Valve: The tricuspid valve is normal in structure. Tricuspid  valve regurgitation is not demonstrated. No evidence of tricuspid  stenosis.   Aortic Valve: The aortic valve is normal in structure. Aortic valve  regurgitation is not visualized. No aortic stenosis is present.   Pulmonic Valve: The pulmonic valve was normal in structure. Pulmonic valve  regurgitation is mild. No evidence of pulmonic stenosis.   Aorta: The aortic root is normal in size and structure.   Venous: The inferior vena cava is normal in  size with greater than 50%  respiratory variability, suggesting right atrial pressure of 3 mmHg.   IAS/Shunts: No atrial level shunt detected by color flow Doppler.      LEFT VENTRICLE  PLAX 2D  LVIDd:         4.20 cm  Diastology  LVIDs:         2.40 cm  LV e' medial:    5.78 cm/s  LV PW:         1.10 cm  LV E/e' medial:  9.8  LV IVS:        0.90 cm  LV e' lateral:   10.20 cm/s  LVOT diam:     2.00 cm  LV E/e' lateral: 5.5  LV SV:         65  LV SV Index:   29  LVOT Area:     3.14 cm      RIGHT VENTRICLE  RV S prime:     14.30 cm/s  TAPSE (M-mode): 1.6  cm   LEFT ATRIUM             Index       RIGHT ATRIUM           Index  LA diam:        3.50 cm 1.55 cm/m  RA Area:     13.90 cm  LA Vol (A2C):   51.3 ml 22.70 ml/m RA Volume:   35.30 ml  15.62 ml/m  LA Vol (A4C):   57.2 ml 25.31 ml/m  LA Biplane Vol: 59.3 ml 26.24 ml/m   AORTIC VALVE  LVOT Vmax:   124.00 cm/s  LVOT Vmean:  85.300 cm/s  LVOT VTI:    0.206 m     AORTA  Ao Root diam: 3.30 cm  Ao Asc diam:  3.30 cm   MITRAL VALVE               TRICUSPID VALVE  MV Area (PHT): 2.26 cm    TR Peak grad:   16.6 mmHg  MV Decel Time: 335 msec    TR Vmax:        204.00 cm/s  MV E velocity: 56.40 cm/s  MV A velocity: 62.90 cm/s  SHUNTS  MV E/A ratio:  0.90        Systemic VTI:  0.21 m                             Systemic Diam: 2.00 cm   Antimicrobials:  Anti-infectives (From admission, onward)    Start     Dose/Rate Route Frequency Ordered Stop   12/12/20 1600  rifaximin (XIFAXAN) tablet 550 mg        550 mg Oral 3 times daily 12/12/20 1238          Subjective: Seen and examined at bedside and states that his diarrhea is better but he was up and down yesterday all day for the bathroom.  No nausea or vomiting.  Had a headache this morning which is improved with Fioricet.  Denies any lightheadedness or dizziness.  Feels well and is ambulating well on his own.  No other concerns or points at this time.  Objective: Vitals:   12/12/20 1800 12/12/20 2000 12/13/20 0000 12/13/20 0400  BP: 136/89 (!) 127/104 138/88 (!) 136/97  Pulse:  (!) 197 95 81  Resp: 17 18    Temp:  99 F (37.2 C) 98.7 F (37.1  C) 98.6 F (37 C)  TempSrc:  Oral Oral Oral  SpO2:  (!) 78% 94% 98%    Intake/Output Summary (Last 24 hours) at 12/13/2020 0759 Last data filed at 12/12/2020 1700 Gross per 24 hour  Intake 75.22 ml  Output --  Net 75.22 ml   There were no vitals filed for this visit.  Examination: Physical Exam:  Constitutional: WN/WD, African-American male, in no acute distress appears  calm Eyes: Lids and conjunctivae normal, sclerae anicteric  ENMT: External Ears, Nose appear normal. Grossly normal hearing.  Neck: Appears normal, supple, no cervical masses, normal ROM, no appreciable thyromegaly; no JVD Respiratory: Diminished to auscultation bilaterally, no wheezing, rales, rhonchi or crackles. Normal respiratory effort and patient is not tachypenic. No accessory muscle use.  Cardiovascular: Tachycardic rate but regular rhythm, no murmurs / rubs / gallops. S1 and S2 auscultated.  Abdomen: Soft, non-tender, non-distended. Bowel sounds positive.  GU: Deferred. Musculoskeletal: No clubbing / cyanosis of digits/nails. No joint deformity upper and lower extremities.  Skin: No rashes, lesions, ulcers on a limited skin evaluation. No induration; Warm and dry.  Neurologic: CN 2-12 grossly intact with no focal deficits. Romberg sign cerebellar reflexes not assessed.  Psychiatric: Normal judgment and insight. Alert and oriented x 3. Normal mood and appropriate affect.   Data Reviewed: I have personally reviewed following labs and imaging studies  CBC: Recent Labs  Lab 12/11/20 1900 12/12/20 0212 12/13/20 0316  WBC 6.9 8.6 8.3  NEUTROABS 5.5 7.8* 4.7  HGB 13.4 13.9 13.5  HCT 41.9 42.7 41.3  MCV 95.4 93.4 94.3  PLT 203 215 956   Basic Metabolic Panel: Recent Labs  Lab 12/11/20 1900 12/12/20 0212 12/13/20 0316  NA 141 140 140  K 3.9 3.6 3.9  CL 108 109 107  CO2 24 20* 25  GLUCOSE 125* 146* 100*  BUN 10 9 17   CREATININE 1.31* 1.17 1.50*  CALCIUM 10.2 9.9 10.2   GFR: CrCl cannot be calculated (Unknown ideal weight.). Liver Function Tests: Recent Labs  Lab 12/11/20 1900  AST 16  ALT 15  ALKPHOS 76  BILITOT 1.0  PROT 8.4*  ALBUMIN 4.5   No results for input(s): LIPASE, AMYLASE in the last 168 hours. No results for input(s): AMMONIA in the last 168 hours. Coagulation Profile: Recent Labs  Lab 12/11/20 1900  INR 0.9   Cardiac Enzymes: No results for  input(s): CKTOTAL, CKMB, CKMBINDEX, TROPONINI in the last 168 hours. BNP (last 3 results) No results for input(s): PROBNP in the last 8760 hours. HbA1C: Recent Labs    12/12/20 0212  HGBA1C 5.9*   CBG: Recent Labs  Lab 12/12/20 1148 12/12/20 1648 12/12/20 2006 12/12/20 2333 12/13/20 0332  GLUCAP 132* 153* 107* 155* 110*   Lipid Profile: Recent Labs    12/12/20 0212  CHOL 268*  HDL 88  LDLCALC 166*  TRIG 71  CHOLHDL 3.0   Thyroid Function Tests: No results for input(s): TSH, T4TOTAL, FREET4, T3FREE, THYROIDAB in the last 72 hours. Anemia Panel: No results for input(s): VITAMINB12, FOLATE, FERRITIN, TIBC, IRON, RETICCTPCT in the last 72 hours. Sepsis Labs: No results for input(s): PROCALCITON, LATICACIDVEN in the last 168 hours.  Recent Results (from the past 240 hour(s))  Resp Panel by RT-PCR (Flu A&B, Covid) Nasopharyngeal Swab     Status: None   Collection Time: 12/11/20  7:46 PM   Specimen: Nasopharyngeal Swab; Nasopharyngeal(NP) swabs in vial transport medium  Result Value Ref Range Status   SARS Coronavirus 2 by RT  PCR NEGATIVE NEGATIVE Final    Comment: (NOTE) SARS-CoV-2 target nucleic acids are NOT DETECTED.  The SARS-CoV-2 RNA is generally detectable in upper respiratory specimens during the acute phase of infection. The lowest concentration of SARS-CoV-2 viral copies this assay can detect is 138 copies/mL. A negative result does not preclude SARS-Cov-2 infection and should not be used as the sole basis for treatment or other patient management decisions. A negative result may occur with  improper specimen collection/handling, submission of specimen other than nasopharyngeal swab, presence of viral mutation(s) within the areas targeted by this assay, and inadequate number of viral copies(<138 copies/mL). A negative result must be combined with clinical observations, patient history, and epidemiological information. The expected result is  Negative.  Fact Sheet for Patients:  EntrepreneurPulse.com.au  Fact Sheet for Healthcare Providers:  IncredibleEmployment.be  This test is no t yet approved or cleared by the [place] FDA and  has been authorized for detection and/or diagnosis of SARS-CoV-2 by FDA under an Emergency Use Authorization (EUA). This EUA will remain  in effect (meaning this test can be used) for the duration of the COVID-19 declaration under Section 564(b)(1) of the Act, 21 U.S.C.section 360bbb-3(b)(1), unless the authorization is terminated  or revoked sooner.       Influenza A by PCR NEGATIVE NEGATIVE Final   Influenza B by PCR NEGATIVE NEGATIVE Final    Comment: (NOTE) The Xpert Xpress SARS-CoV-2/FLU/RSV plus assay is intended as an aid in the diagnosis of influenza from Nasopharyngeal swab specimens and should not be used as a sole basis for treatment. Nasal washings and aspirates are unacceptable for Xpert Xpress SARS-CoV-2/FLU/RSV testing.  Fact Sheet for Patients: EntrepreneurPulse.com.au  Fact Sheet for Healthcare Providers: IncredibleEmployment.be  This test is not yet approved or cleared by the [place] FDA and has been authorized for detection and/or diagnosis of SARS-CoV-2 by FDA under an Emergency Use Authorization (EUA). This EUA will remain in effect (meaning this test can be used) for the duration of the COVID-19 declaration under Section 564(b)(1) of the Act, 21 U.S.C. section 360bbb-3(b)(1), unless the authorization is terminated or revoked.  Performed at [facility], [place] [address], [place], [place] [address]   MRSA Next Gen by PCR, Nasal     Status: None   Collection Time: 12/11/20  9:36 PM   Specimen: Nasal Mucosa; Nasal Swab  Result Value Ref Range Status   MRSA by PCR Next Gen NOT DETECTED NOT DETECTED Final    Comment: (NOTE) The GeneXpert MRSA Assay (FDA approved  for NASAL specimens only), is one component of a comprehensive MRSA colonization surveillance program. It is not intended to diagnose MRSA infection nor to guide or monitor treatment for MRSA infections. Test performance is not FDA approved in patients less than 29 years old. Performed at [facility], [place] [address], [place], [place] [address]      RN Pressure Injury Documentation:     Estimated body mass index is 33.68 kg/m as calculated from the following:   Height as of 02/19/20: 5' 10.5" (1.791 m).   Weight as of 02/19/20: 108 kg.  Malnutrition Type:   Malnutrition Characteristics:   Nutrition Interventions:   Radiology Studies: CT HEAD WO CONTRAST  Result Date: 12/12/2020 CLINICAL DATA:  Stroke follow-up.  Pupillary asymmetry. EXAM: CT HEAD WITHOUT CONTRAST TECHNIQUE: Contiguous axial images were obtained from the base of the skull through the vertex without intravenous contrast. COMPARISON:  Head CT 12/11/2020 at 7:28 p.m. FINDINGS: Brain:  Unchanged appearance of intraparenchymal hematoma centered at the right caudate head. There is extension of hemorrhage into the ventricles with mild communicating hydrocephalus. There is no midline shift. No new site of hemorrhage. Vascular: No abnormal hyperdensity of the major intracranial arteries or dural venous sinuses. No intracranial atherosclerosis. Skull: The visualized skull base, calvarium and extracranial soft tissues are normal. Sinuses/Orbits: No fluid levels or advanced mucosal thickening of the visualized paranasal sinuses. No mastoid or middle ear effusion. The orbits are normal. IMPRESSION: Unchanged appearance of intraparenchymal hematoma centered at the right caudate head with intraventricular extension and mild communicating hydrocephalus. Electronically Signed   By: Ulyses Jarred M.D.   On: 12/12/2020 00:10   CT Head Wo Contrast  Result Date: 12/11/2020 CLINICAL DATA:  Headache EXAM: CT HEAD WITHOUT CONTRAST TECHNIQUE:  Contiguous axial images were obtained from the base of the skull through the vertex without intravenous contrast. COMPARISON:  CT brain 02/19/2020 FINDINGS: Brain: 19 x 20 mm acute hemorrhage within the right caudate/basal ganglia. Moderate acute hemorrhage within the right lateral ventricle with additional hemorrhage in the third and fourth ventricles. The ventricles are mildly enlarged. No midline shift. Mild chronic small vessel ischemic changes of the white matter. Vascular: No hyperdense vessels.  No unexpected calcification Skull: Normal. Negative for fracture or focal lesion. Sinuses/Orbits: No acute finding. Other: None IMPRESSION: 1. Acute hemorrhage within the right caudate/basal ganglia with hemorrhage in the right lateral, third, and fourth ventricles. No midline shift. The ventricles are slightly dilated as compared with prior CT from 2021. 2. Mild chronic small vessel ischemic change of the white matter. Critical Value/emergent results were called by telephone at the time of interpretation on 12/11/2020 at 7:45 pm to provider ADAM CURATOLO , who verbally acknowledged these results. Electronically Signed   By: Donavan Foil M.D.   On: 12/11/2020 19:50   MR MRA HEAD WO CONTRAST  Result Date: 12/12/2020 CLINICAL DATA:  Intracranial hemorrhage. EXAM: MRI HEAD WITHOUT AND WITH CONTRAST MRA HEAD WITHOUT CONTRAST TECHNIQUE: Multiplanar, multi-echo pulse sequences of the brain and surrounding structures were acquired without and with intravenous contrast. Angiographic images of the Circle of Willis were acquired using MRA technique without intravenous contrast. CONTRAST:  48m GADAVIST GADOBUTROL 1 MMOL/ML IV SOLN COMPARISON:  Head CT 12/11/2020 FINDINGS: MRI HEAD FINDINGS Brain: Unchanged intraparenchymal hematoma of the right caudate head with intraventricular extension. 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 No acute infarct. There is multifocal hyperintense T2-weighted signal within the white  matter. Generalized volume loss without a clear lobar predilection. There is no abnormal contrast enhancement. Vascular: Major flow voids are preserved. Skull and upper cervical spine: Normal calvarium and skull base. Visualized upper cervical spine and soft tissues are normal. Sinuses/Orbits:No paranasal sinus fluid levels or advanced mucosal thickening. No mastoid or middle ear effusion. Normal orbits. MRA HEAD FINDINGS POSTERIOR CIRCULATION: --Vertebral arteries: Normal --Inferior cerebellar arteries: Normal. --Basilar artery: Normal. --Superior cerebellar arteries: Normal. --Posterior cerebral arteries: Normal. Both are predominantly supplied by the posterior communicating arteries (p-comm). ANTERIOR CIRCULATION: --Intracranial internal carotid arteries: Normal. --Anterior cerebral arteries (ACA): Normal. --Middle cerebral arteries (MCA): Normal. ANATOMIC VARIANTS: Fetal origins of both posterior cerebral arteries. IMPRESSION: 1. Unchanged intraparenchymal hematoma of the right caudate head with intraventricular extension and mild communicating hydrocephalus. 2. Normal intracranial MRA. Electronically Signed   By: KUlyses JarredM.D.   On: 12/12/2020 20:50   MR BRAIN W WO CONTRAST  Result Date: 12/12/2020 CLINICAL DATA:  Intracranial hemorrhage. EXAM: MRI HEAD WITHOUT AND WITH CONTRAST MRA HEAD  WITHOUT CONTRAST TECHNIQUE: Multiplanar, multi-echo pulse sequences of the brain and surrounding structures were acquired without and with intravenous contrast. Angiographic images of the Circle of Willis were acquired using MRA technique without intravenous contrast. CONTRAST:  72m GADAVIST GADOBUTROL 1 MMOL/ML IV SOLN COMPARISON:  Head CT 12/11/2020 FINDINGS: MRI HEAD FINDINGS Brain: Unchanged intraparenchymal hematoma of the right caudate head with intraventricular extension. 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 No acute infarct. There is multifocal hyperintense T2-weighted signal within the white matter.  Generalized volume loss without a clear lobar predilection. There is no abnormal contrast enhancement. Vascular: Major flow voids are preserved. Skull and upper cervical spine: Normal calvarium and skull base. Visualized upper cervical spine and soft tissues are normal. Sinuses/Orbits:No paranasal sinus fluid levels or advanced mucosal thickening. No mastoid or middle ear effusion. Normal orbits. MRA HEAD FINDINGS POSTERIOR CIRCULATION: --Vertebral arteries: Normal --Inferior cerebellar arteries: Normal. --Basilar artery: Normal. --Superior cerebellar arteries: Normal. --Posterior cerebral arteries: Normal. Both are predominantly supplied by the posterior communicating arteries (p-comm). ANTERIOR CIRCULATION: --Intracranial internal carotid arteries: Normal. --Anterior cerebral arteries (ACA): Normal. --Middle cerebral arteries (MCA): Normal. ANATOMIC VARIANTS: Fetal origins of both posterior cerebral arteries. IMPRESSION: 1. Unchanged intraparenchymal hematoma of the right caudate head with intraventricular extension and mild communicating hydrocephalus. 2. Normal intracranial MRA. Electronically Signed   By: KUlyses JarredM.D.   On: 12/12/2020 20:50   ECHOCARDIOGRAM COMPLETE  Result Date: 12/12/2020    ECHOCARDIOGRAM REPORT   Patient Name:   DBOEN STERBENZDate of Exam: 12/12/2020 Medical Rec #:  [id number]    Height:       70.5 in Accession #:    [id number]   Weight:       238.1 lb Date of Birth:  1Sep 12, 1962    BSA:          2.260 m Patient Age:    89years      BP:           138/94 mmHg Patient Gender: M             HR:           95 bpm. Exam Location:  Inpatient Procedure: 2D Echo, Color Doppler and Cardiac Doppler Indications:    Stroke I63.9  History:        Patient has no prior history of Echocardiogram examinations.                 Risk Factors:Dyslipidemia and Hypertension.  Sonographer:    SBernadene PersonRDCS Referring Phys: [id number] 1. Left ventricular ejection fraction,  by estimation, is 60 to 65%. The left ventricle has normal function. The left ventricle has no regional wall motion abnormalities. Left ventricular diastolic parameters are consistent with Grade I diastolic dysfunction (impaired relaxation).  2. Right ventricular systolic function is normal. The right ventricular size is normal. There is normal pulmonary artery systolic pressure. The estimated right ventricular systolic pressure is 143.1mmHg.  3. The mitral valve is normal in structure. Trivial mitral valve regurgitation. No evidence of mitral stenosis.  4. The aortic valve is normal in structure. Aortic valve regurgitation is not visualized. No aortic stenosis is present.  5. The inferior vena cava is normal in size with greater than 50% respiratory variability, suggesting right atrial pressure of 3 mmHg. Conclusion(s)/Recommendation(s): No intracardiac source of embolism detected on this transthoracic study. A transesophageal echocardiogram is recommended to exclude cardiac source of embolism if clinically indicated. FINDINGS  Left  Ventricle: Left ventricular ejection fraction, by estimation, is 60 to 65%. The left ventricle has normal function. The left ventricle has no regional wall motion abnormalities. The left ventricular internal cavity size was normal in size. There is  borderline left ventricular hypertrophy. Left ventricular diastolic parameters are consistent with Grade I diastolic dysfunction (impaired relaxation). Right Ventricle: The right ventricular size is normal. No increase in right ventricular wall thickness. Right ventricular systolic function is normal. There is normal pulmonary artery systolic pressure. The tricuspid regurgitant velocity is 2.04 m/s, and  with an assumed right atrial pressure of 3 mmHg, the estimated right ventricular systolic pressure is 37.8 mmHg. Left Atrium: Left atrial size was normal in size. Right Atrium: Right atrial size was normal in size. Pericardium: There is no  evidence of pericardial effusion. Mitral Valve: The mitral valve is normal in structure. Trivial mitral valve regurgitation. No evidence of mitral valve stenosis. Tricuspid Valve: The tricuspid valve is normal in structure. Tricuspid valve regurgitation is not demonstrated. No evidence of tricuspid stenosis. Aortic Valve: The aortic valve is normal in structure. Aortic valve regurgitation is not visualized. No aortic stenosis is present. Pulmonic Valve: The pulmonic valve was normal in structure. Pulmonic valve regurgitation is mild. No evidence of pulmonic stenosis. Aorta: The aortic root is normal in size and structure. Venous: The inferior vena cava is normal in size with greater than 50% respiratory variability, suggesting right atrial pressure of 3 mmHg. IAS/Shunts: No atrial level shunt detected by color flow Doppler.  LEFT VENTRICLE PLAX 2D LVIDd:         4.20 cm  Diastology LVIDs:         2.40 cm  LV e' medial:    5.78 cm/s LV PW:         1.10 cm  LV E/e' medial:  9.8 LV IVS:        0.90 cm  LV e' lateral:   10.20 cm/s LVOT diam:     2.00 cm  LV E/e' lateral: 5.5 LV SV:         65 LV SV Index:   29 LVOT Area:     3.14 cm  RIGHT VENTRICLE RV S prime:     14.30 cm/s TAPSE (M-mode): 1.6 cm LEFT ATRIUM             Index       RIGHT ATRIUM           Index LA diam:        3.50 cm 1.55 cm/m  RA Area:     13.90 cm LA Vol (A2C):   51.3 ml 22.70 ml/m RA Volume:   35.30 ml  15.62 ml/m LA Vol (A4C):   57.2 ml 25.31 ml/m LA Biplane Vol: 59.3 ml 26.24 ml/m  AORTIC VALVE LVOT Vmax:   124.00 cm/s LVOT Vmean:  85.300 cm/s LVOT VTI:    0.206 m  AORTA Ao Root diam: 3.30 cm Ao Asc diam:  3.30 cm MITRAL VALVE               TRICUSPID VALVE MV Area (PHT): 2.26 cm    TR Peak grad:   16.6 mmHg MV Decel Time: 335 msec    TR Vmax:        204.00 cm/s MV E velocity: 56.40 cm/s MV A velocity: 62.90 cm/s  SHUNTS MV E/A ratio:  0.90        Systemic VTI:  0.21 m  Systemic Diam: 2.00 cm Cherlynn Kaiser MD  Electronically signed by Cherlynn Kaiser MD Signature Date/Time: 12/12/2020/4:46:34 PM    Final     Scheduled Meds:  amLODipine  10 mg Oral Daily   Chlorhexidine Gluconate Cloth  6 each Topical Daily   ezetimibe  10 mg Oral Daily   hydrALAZINE  100 mg Oral BID   insulin aspart  0-9 Units Subcutaneous Q4H   loperamide  2 mg Oral BID   multivitamin with minerals  1 tablet Oral Daily   pantoprazole  40 mg Oral Daily   rifaximin  550 mg Oral TID   rosuvastatin  10 mg Oral QODAY   vitamin B-12  100 mcg Oral Daily   Vitamin D (Ergocalciferol)  50,000 Units Oral Q Wed   Continuous Infusions:   LOS: 2 days   Kerney Elbe, DO [facility] PAGER is on [place]  If 7PM-7AM, please contact night-coverage [email or web address]

## 2020-12-13 NOTE — Progress Notes (Signed)
STROKE TEAM PROGRESS NOTE   SUBJECTIVE (INTERVAL HISTORY) No family is at the bedside.  Overall his condition is stable. Pt had mild HA this am, improved with fioricet, now no HA. Had diarrhea 18 times yesterday but much improved after Xifaxan. PT/OT no recs. Pt has called Xifaxan pt assistance program but not qualified. Now he is dealing with insurance company to see if he can have low pay for the meds.   OBJECTIVE Temp:  [97.8 F (36.6 C)-99 F (37.2 C)] 98.7 F (37.1 C) (06/30 0800) Pulse Rate:  [81-197] 81 (06/30 0400) Resp:  [15-20] 18 (06/29 2000) BP: (127-140)/(86-114) 136/97 (06/30 0400) SpO2:  [78 %-99 %] 98 % (06/30 0400)  Recent Labs  Lab 12/12/20 1648 12/12/20 2006 12/12/20 2333 12/13/20 0332 12/13/20 0800  GLUCAP 153* 107* 155* 110* 107*   Recent Labs  Lab 12/11/20 1900 12/12/20 0212 12/13/20 0316  NA 141 140 140  K 3.9 3.6 3.9  CL 108 109 107  CO2 24 20* 25  GLUCOSE 125* 146* 100*  BUN 10 9 17   CREATININE 1.31* 1.17 1.50*  CALCIUM 10.2 9.9 10.2   Recent Labs  Lab 12/11/20 1900  AST 16  ALT 15  ALKPHOS 76  BILITOT 1.0  PROT 8.4*  ALBUMIN 4.5   Recent Labs  Lab 12/11/20 1900 12/12/20 0212 12/13/20 0316  WBC 6.9 8.6 8.3  NEUTROABS 5.5 7.8* 4.7  HGB 13.4 13.9 13.5  HCT 41.9 42.7 41.3  MCV 95.4 93.4 94.3  PLT 203 215 207   No results for input(s): CKTOTAL, CKMB, CKMBINDEX, TROPONINI in the last 168 hours. Recent Labs    12/11/20 1900  LABPROT 12.0  INR 0.9   Recent Labs    12/12/20 0324  COLORURINE YELLOW  LABSPEC 1.013  PHURINE 5.0  GLUCOSEU NEGATIVE  HGBUR NEGATIVE  BILIRUBINUR NEGATIVE  KETONESUR 20*  PROTEINUR NEGATIVE  NITRITE NEGATIVE  LEUKOCYTESUR NEGATIVE       Component Value Date/Time   CHOL 268 (H) 12/12/2020 0212   TRIG 71 12/12/2020 0212   HDL 88 12/12/2020 0212   CHOLHDL 3.0 12/12/2020 0212   VLDL 14 12/12/2020 0212   LDLCALC 166 (H) 12/12/2020 0212   Lab Results  Component Value Date   HGBA1C 5.9 (H)  12/12/2020      Component Value Date/Time   LABOPIA NONE DETECTED 12/12/2020 0324   COCAINSCRNUR POSITIVE (A) 12/12/2020 0324   LABBENZ NONE DETECTED 12/12/2020 0324   AMPHETMU NONE DETECTED 12/12/2020 0324   THCU NONE DETECTED 12/12/2020 0324   LABBARB NONE DETECTED 12/12/2020 0324    No results for input(s): ETH in the last 168 hours.  I have personally reviewed the radiological images below and agree with the radiology interpretations.  CT HEAD WO CONTRAST  Result Date: 12/12/2020 CLINICAL DATA:  Stroke follow-up.  Pupillary asymmetry. EXAM: CT HEAD WITHOUT CONTRAST TECHNIQUE: Contiguous axial images were obtained from the base of the skull through the vertex without intravenous contrast. COMPARISON:  Head CT 12/11/2020 at 7:28 p.m. FINDINGS: Brain: Unchanged appearance of intraparenchymal hematoma centered at the right caudate head. There is extension of hemorrhage into the ventricles with mild communicating hydrocephalus. There is no midline shift. No new site of hemorrhage. Vascular: No abnormal hyperdensity of the major intracranial arteries or dural venous sinuses. No intracranial atherosclerosis. Skull: The visualized skull base, calvarium and extracranial soft tissues are normal. Sinuses/Orbits: No fluid levels or advanced mucosal thickening of the visualized paranasal sinuses. No mastoid or middle ear effusion. The orbits  are normal. IMPRESSION: Unchanged appearance of intraparenchymal hematoma centered at the right caudate head with intraventricular extension and mild communicating hydrocephalus. Electronically Signed   By: Ulyses Jarred M.D.   On: 12/12/2020 00:10   CT Head Wo Contrast  Result Date: 12/11/2020 CLINICAL DATA:  Headache EXAM: CT HEAD WITHOUT CONTRAST TECHNIQUE: Contiguous axial images were obtained from the base of the skull through the vertex without intravenous contrast. COMPARISON:  CT brain 02/19/2020 FINDINGS: Brain: 19 x 20 mm acute hemorrhage within the right  caudate/basal ganglia. Moderate acute hemorrhage within the right lateral ventricle with additional hemorrhage in the third and fourth ventricles. The ventricles are mildly enlarged. No midline shift. Mild chronic small vessel ischemic changes of the white matter. Vascular: No hyperdense vessels.  No unexpected calcification Skull: Normal. Negative for fracture or focal lesion. Sinuses/Orbits: No acute finding. Other: None IMPRESSION: 1. Acute hemorrhage within the right caudate/basal ganglia with hemorrhage in the right lateral, third, and fourth ventricles. No midline shift. The ventricles are slightly dilated as compared with prior CT from 2021. 2. Mild chronic small vessel ischemic change of the white matter. Critical Value/emergent results were called by telephone at the time of interpretation on 12/11/2020 at 7:45 pm to provider ADAM CURATOLO , who verbally acknowledged these results. Electronically Signed   By: Donavan Foil M.D.   On: 12/11/2020 19:50   MR MRA HEAD WO CONTRAST  Result Date: 12/12/2020 CLINICAL DATA:  Intracranial hemorrhage. EXAM: MRI HEAD WITHOUT AND WITH CONTRAST MRA HEAD WITHOUT CONTRAST TECHNIQUE: Multiplanar, multi-echo pulse sequences of the brain and surrounding structures were acquired without and with intravenous contrast. Angiographic images of the Circle of Willis were acquired using MRA technique without intravenous contrast. CONTRAST:  61m GADAVIST GADOBUTROL 1 MMOL/ML IV SOLN COMPARISON:  Head CT 12/11/2020 FINDINGS: MRI HEAD FINDINGS Brain: Unchanged intraparenchymal hematoma of the right caudate head with intraventricular extension. 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 No acute infarct. There is multifocal hyperintense T2-weighted signal within the white matter. Generalized volume loss without a clear lobar predilection. There is no abnormal contrast enhancement. Vascular: Major flow voids are preserved. Skull and upper cervical spine: Normal calvarium and skull  base. Visualized upper cervical spine and soft tissues are normal. Sinuses/Orbits:No paranasal sinus fluid levels or advanced mucosal thickening. No mastoid or middle ear effusion. Normal orbits. MRA HEAD FINDINGS POSTERIOR CIRCULATION: --Vertebral arteries: Normal --Inferior cerebellar arteries: Normal. --Basilar artery: Normal. --Superior cerebellar arteries: Normal. --Posterior cerebral arteries: Normal. Both are predominantly supplied by the posterior communicating arteries (p-comm). ANTERIOR CIRCULATION: --Intracranial internal carotid arteries: Normal. --Anterior cerebral arteries (ACA): Normal. --Middle cerebral arteries (MCA): Normal. ANATOMIC VARIANTS: Fetal origins of both posterior cerebral arteries. IMPRESSION: 1. Unchanged intraparenchymal hematoma of the right caudate head with intraventricular extension and mild communicating hydrocephalus. 2. Normal intracranial MRA. Electronically Signed   By: KUlyses JarredM.D.   On: 12/12/2020 20:50   MR BRAIN W WO CONTRAST  Result Date: 12/12/2020 CLINICAL DATA:  Intracranial hemorrhage. EXAM: MRI HEAD WITHOUT AND WITH CONTRAST MRA HEAD WITHOUT CONTRAST TECHNIQUE: Multiplanar, multi-echo pulse sequences of the brain and surrounding structures were acquired without and with intravenous contrast. Angiographic images of the Circle of Willis were acquired using MRA technique without intravenous contrast. CONTRAST:  960mGADAVIST GADOBUTROL 1 MMOL/ML IV SOLN COMPARISON:  Head CT 12/11/2020 FINDINGS: MRI HEAD FINDINGS Brain: Unchanged intraparenchymal hematoma of the right caudate head with intraventricular extension. 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 No acute infarct. There is multifocal hyperintense T2-weighted signal within the white  matter. Generalized volume loss without a clear lobar predilection. There is no abnormal contrast enhancement. Vascular: Major flow voids are preserved. Skull and upper cervical spine: Normal calvarium and skull base.  Visualized upper cervical spine and soft tissues are normal. Sinuses/Orbits:No paranasal sinus fluid levels or advanced mucosal thickening. No mastoid or middle ear effusion. Normal orbits. MRA HEAD FINDINGS POSTERIOR CIRCULATION: --Vertebral arteries: Normal --Inferior cerebellar arteries: Normal. --Basilar artery: Normal. --Superior cerebellar arteries: Normal. --Posterior cerebral arteries: Normal. Both are predominantly supplied by the posterior communicating arteries (p-comm). ANTERIOR CIRCULATION: --Intracranial internal carotid arteries: Normal. --Anterior cerebral arteries (ACA): Normal. --Middle cerebral arteries (MCA): Normal. ANATOMIC VARIANTS: Fetal origins of both posterior cerebral arteries. IMPRESSION: 1. Unchanged intraparenchymal hematoma of the right caudate head with intraventricular extension and mild communicating hydrocephalus. 2. Normal intracranial MRA. Electronically Signed   By: Ulyses Jarred M.D.   On: 12/12/2020 20:50   ECHOCARDIOGRAM COMPLETE  Result Date: 12/12/2020    ECHOCARDIOGRAM REPORT   Patient Name:   Matthew Francis Date of Exam: 12/12/2020 Medical Rec #:  [id number]     Height:       70.5 in Accession #:    [id number]    Weight:       238.1 lb Date of Birth:  10-26-1960     BSA:          2.260 m Patient Age:    21 years      BP:           138/94 mmHg Patient Gender: M             HR:           95 bpm. Exam Location:  Inpatient Procedure: 2D Echo, Color Doppler and Cardiac Doppler Indications:    Stroke I63.9  History:        Patient has no prior history of Echocardiogram examinations.                 Risk Factors:Dyslipidemia and Hypertension.  Sonographer:    Bernadene Person RDCS Referring Phys: [id number] [place]  1. Left ventricular ejection fraction, by estimation, is 60 to 65%. The left ventricle has normal function. The left ventricle has no regional wall motion abnormalities. Left ventricular diastolic parameters are consistent with Grade I diastolic  dysfunction (impaired relaxation).  2. Right ventricular systolic function is normal. The right ventricular size is normal. There is normal pulmonary artery systolic pressure. The estimated right ventricular systolic pressure is 82.4 mmHg.  3. The mitral valve is normal in structure. Trivial mitral valve regurgitation. No evidence of mitral stenosis.  4. The aortic valve is normal in structure. Aortic valve regurgitation is not visualized. No aortic stenosis is present.  5. The inferior vena cava is normal in size with greater than 50% respiratory variability, suggesting right atrial pressure of 3 mmHg. Conclusion(s)/Recommendation(s): No intracardiac source of embolism detected on this transthoracic study. A transesophageal echocardiogram is recommended to exclude cardiac source of embolism if clinically indicated. FINDINGS  Left Ventricle: Left ventricular ejection fraction, by estimation, is 60 to 65%. The left ventricle has normal function. The left ventricle has no regional wall motion abnormalities. The left ventricular internal cavity size was normal in size. There is  borderline left ventricular hypertrophy. Left ventricular diastolic parameters are consistent with Grade I diastolic dysfunction (impaired relaxation). Right Ventricle: The right ventricular size is normal. No increase in right ventricular wall thickness. Right ventricular systolic function is normal. There is normal pulmonary artery  systolic pressure. The tricuspid regurgitant velocity is 2.04 m/s, and  with an assumed right atrial pressure of 3 mmHg, the estimated right ventricular systolic pressure is 26.9 mmHg. Left Atrium: Left atrial size was normal in size. Right Atrium: Right atrial size was normal in size. Pericardium: There is no evidence of pericardial effusion. Mitral Valve: The mitral valve is normal in structure. Trivial mitral valve regurgitation. No evidence of mitral valve stenosis. Tricuspid Valve: The tricuspid valve is  normal in structure. Tricuspid valve regurgitation is not demonstrated. No evidence of tricuspid stenosis. Aortic Valve: The aortic valve is normal in structure. Aortic valve regurgitation is not visualized. No aortic stenosis is present. Pulmonic Valve: The pulmonic valve was normal in structure. Pulmonic valve regurgitation is mild. No evidence of pulmonic stenosis. Aorta: The aortic root is normal in size and structure. Venous: The inferior vena cava is normal in size with greater than 50% respiratory variability, suggesting right atrial pressure of 3 mmHg. IAS/Shunts: No atrial level shunt detected by color flow Doppler.  LEFT VENTRICLE PLAX 2D LVIDd:         4.20 cm  Diastology LVIDs:         2.40 cm  LV e' medial:    5.78 cm/s LV PW:         1.10 cm  LV E/e' medial:  9.8 LV IVS:        0.90 cm  LV e' lateral:   10.20 cm/s LVOT diam:     2.00 cm  LV E/e' lateral: 5.5 LV SV:         65 LV SV Index:   29 LVOT Area:     3.14 cm  RIGHT VENTRICLE RV S prime:     14.30 cm/s TAPSE (M-mode): 1.6 cm LEFT ATRIUM             Index       RIGHT ATRIUM           Index LA diam:        3.50 cm 1.55 cm/m  RA Area:     13.90 cm LA Vol (A2C):   51.3 ml 22.70 ml/m RA Volume:   35.30 ml  15.62 ml/m LA Vol (A4C):   57.2 ml 25.31 ml/m LA Biplane Vol: 59.3 ml 26.24 ml/m  AORTIC VALVE LVOT Vmax:   124.00 cm/s LVOT Vmean:  85.300 cm/s LVOT VTI:    0.206 m  AORTA Ao Root diam: 3.30 cm Ao Asc diam:  3.30 cm MITRAL VALVE               TRICUSPID VALVE MV Area (PHT): 2.26 cm    TR Peak grad:   16.6 mmHg MV Decel Time: 335 msec    TR Vmax:        204.00 cm/s MV E velocity: 56.40 cm/s MV A velocity: 62.90 cm/s  SHUNTS MV E/A ratio:  0.90        Systemic VTI:  0.21 m                            Systemic Diam: 2.00 cm Cherlynn Kaiser MD Electronically signed by Cherlynn Kaiser MD Signature Date/Time: 12/12/2020/4:46:34 PM    Final    VAS US CAROTID  Result Date: 12/13/2020 Carotid Arterial Duplex Study Patient Name:  Matthew Francis   Date of Exam:   12/13/2020 Medical Rec #: [id number]      Accession #:  [id number] Date of Birth: August 26, 1960      Patient Gender: M Patient Age:   59Y Exam Location:  [facility] Procedure:      VAS US CAROTID Referring Phys: [id number] Rosalin Hawking --------------------------------------------------------------------------------  Indications:       CVA. Risk Factors:      Hypertension, hyperlipidemia. Comparison Study:  no prior Performing Technologist: Archie Patten RVS  Examination Guidelines: A complete evaluation includes B-mode imaging, spectral Doppler, color Doppler, and power Doppler as needed of all accessible portions of each vessel. Bilateral testing is considered an integral part of a complete examination. Limited examinations for reoccurring indications may be performed as noted.  Right Carotid Findings: +----------+--------+--------+--------+------------------+--------+           PSV cm/sEDV cm/sStenosisPlaque DescriptionComments +----------+--------+--------+--------+------------------+--------+ CCA Prox  77      9               heterogenous               +----------+--------+--------+--------+------------------+--------+ CCA Distal65      14              heterogenous               +----------+--------+--------+--------+------------------+--------+ ICA Prox  39      13      1-39%   heterogenous               +----------+--------+--------+--------+------------------+--------+ ICA Distal37      14                                         +----------+--------+--------+--------+------------------+--------+ ECA       69      11                                         +----------+--------+--------+--------+------------------+--------+ +----------+--------+-------+--------+-------------------+           PSV cm/sEDV cmsDescribeArm Pressure (mmHG) +----------+--------+-------+--------+-------------------+ [id number]                                          +----------+--------+-------+--------+-------------------+ +---------+--------+--+--------+-+---------+ VertebralPSV cm/s31EDV cm/s9Antegrade +---------+--------+--+--------+-+---------+  Left Carotid Findings: +----------+--------+--------+--------+------------------+--------+           PSV cm/sEDV cm/sStenosisPlaque DescriptionComments +----------+--------+--------+--------+------------------+--------+ CCA Prox  118     22              heterogenous               +----------+--------+--------+--------+------------------+--------+ CCA Distal63      21              heterogenous               +----------+--------+--------+--------+------------------+--------+ ICA Prox  53      22      1-39%   heterogenous               +----------+--------+--------+--------+------------------+--------+ ICA Distal65      26                                         +----------+--------+--------+--------+------------------+--------+ ECA       73  18                                         +----------+--------+--------+--------+------------------+--------+ +----------+--------+--------+--------+-------------------+           PSV cm/sEDV cm/sDescribeArm Pressure (mmHG) +----------+--------+--------+--------+-------------------+ [id number]                                          +----------+--------+--------+--------+-------------------+ +---------+--------+--+--------+-+---------+ VertebralPSV cm/s32EDV cm/s9Antegrade +---------+--------+--+--------+-+---------+   Summary: Right Carotid: Velocities in the right ICA are consistent with a 1-39% stenosis. Left Carotid: Velocities in the left ICA are consistent with a 1-39% stenosis. Vertebrals: Bilateral vertebral arteries demonstrate antegrade flow. *See table(s) above for measurements and observations.     Preliminary      PHYSICAL EXAM  Temp:  [97.8 F (36.6 C)-99 F (37.2 C)] 98.7 F (37.1 C) (06/30  0800) Pulse Rate:  [81-197] 81 (06/30 0400) Resp:  [15-20] 18 (06/29 2000) BP: (127-140)/(86-114) 136/97 (06/30 0400) SpO2:  [78 %-99 %] 98 % (06/30 0400)  General - Well nourished, well developed, in no apparent distress.  Ophthalmologic - fundi not visualized due to noncooperation.  Cardiovascular - Regular rhythm and rate.  Mental Status -  Level of arousal and orientation to time, place, and person were intact. Language including expression, naming, repetition, comprehension was assessed and found intact. Fund of Knowledge was assessed and was intact.  Cranial Nerves II - XII - II - Visual field intact OU. III, IV, VI - Extraocular movements intact. V - Facial sensation intact bilaterally. VII - Facial movement intact bilaterally. VIII - Hearing & vestibular intact bilaterally. X - Palate elevates symmetrically. XI - Chin turning & shoulder shrug intact bilaterally. XII - Tongue protrusion intact.  Motor Strength - The patient's strength was normal in all extremities and pronator drift was absent.  Bulk was normal and fasciculations were absent.   Motor Tone - Muscle tone was assessed at the neck and appendages and was normal.  Reflexes - The patient's reflexes were symmetrical in all extremities and he had no pathological reflexes.  Sensory - Light touch, temperature/pinprick were assessed and were symmetrical.    Coordination - The patient had normal movements in the hands with no ataxia or dysmetria.  Tremor was absent.  Gait and Station - deferred.   ASSESSMENT/PLAN Matthew Francis is a 60 y.o. male with history of ulcerative colitis, HTN, HLD, DM, CKD, lone A. fib admitted for headache, confusion, nausea vomiting. No tPA given due to [place].    ICH:  right caudate head ICH with IVH likely secondary to hypertensive emergency and small vessel disease source CT right caudate head ICH with IVH. CT head repeat stable hematoma and ventricular size MRI with and without  contrast unchanged hematoma, no underlying mass MRA unremarkable, no aneurysm or AVM Carotid Doppler unremarkable 2D Echo EF 60 to 65% LDL 166 HgbA1c 5.9 SCDs for VTE prophylaxis No antithrombotic prior to admission, now on No antithrombotic given ICH Patient counseled to be compliant with his antithrombotic medications Ongoing aggressive stroke risk factor management Therapy recommendations:  none Disposition:  pending   IVH CT showed IVH right lateral ventricle, third ventricle and partial fourth ventricle No hydrocephalus seen MRI stable no hydro CT repeat in am to rule out hydrocephalus  Ulcerative colitis Frequent diarrhea Follows  with GI at [facility] Recently prescribed Xifaxan but not able to afford the cost Restart Xifaxan this time as inpt On loperamide  Diabetes HgbA1c 5.9, goal < 7.0 Controlled CBG monitoring SSI DM education and close PCP follow up  Hypertension Home meds: Hydralazine, Norvasc and atenolol Stable on Cleviprex BP goal less than 160 Off Cleviprex now On hydralazine 100 bid and Norvasc Long term BP goal normotensive  Hyperlipidemia Home meds: Crestor 10 every other day and Zetia LDL 166, goal < 70 Now on Crestor 10 every other day and Zetia - pt denies side effect with crestor, will increase to 20 daily Continue statin and Zetia at discharge  Cocaine abuse UDS positive for cocaine Avoid selective beta-blockers including metoprolol, atenolol Cessation education provided Patient is willing to quit  AKI creatinine 1.31-> 1.17->1.50 likely due to dehydration from diarrhea, which is better today Encourage po intake Management per primary team  Other Stroke Risk Factors Obesity   Other Active Problems [place] fib in 01/2020 in the setting of sepsis  Hospital day # 2   Rosalin Hawking, MD PhD Stroke Neurology 12/13/2020 12:28 PM    To contact Stroke Continuity provider, please refer to [email or web address] After hours, contact General  Neurology

## 2020-12-13 NOTE — Progress Notes (Signed)
Occupational Therapy Evaluation Patient Details Name: Matthew Francis MRN: [id number] DOB: [birth year] Today's Date: 12/13/2020    History of Present Illness Pt is a 60 y.o. male who presented 6/28 with AMS, nausea, and headache. 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 PMH: arthritis, GERD, HTN, inguinal hernia, and ulcerative colitis.   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 Pt appears to be at baseline with very mild coordination difficulty with LUE, however not impacting function. Began discussing lifestyle choices and pt talked about his drug use history and the impact on his life. Pt interested in information regarding drug rehab and resources. SW contacted and to discuss resources with pt. Pt very appreciative. No further OT needed.     Follow Up Recommendations  Other (comment) (Drug Rehab)    Equipment Recommendations  None recommended by OT    Recommendations for Other Services Other (comment) (SW consult to discuss drug rehab)     Precautions / Restrictions Precautions Precautions: Other (comment) Precaution Comments: SBP < 140      Mobility Bed Mobility Overal bed mobility: Independent             General bed mobility comments: Bed flat and rails down, pt able to perform all bed mobility safely in appropriate time frame.    Transfers Overall transfer level: Independent Equipment used: None             General transfer comment: Pt able to power up to stand quickly without LOB.    Balance Overall balance assessment: No apparent balance deficits (not formally assessed)                                         ADL either performed or assessed with clinical judgement   ADL Overall ADL's : At baseline                                              Vision Baseline Vision/History: No visual deficits Vision Assessment?: No apparent visual deficits     Perception Perception Perception Tested?:  (no deficits noted)   Praxis Praxis Praxis tested?: Within functional limits    Pertinent Vitals/Pain       Hand Dominance Right (write with R hand but states he is ambidextrous)   Extremity/Trunk Assessment Upper Extremity Assessment Upper Extremity Assessment: LUE deficits/detail LUE Deficits / Details: mild coodrdination noted but functional. Pt states he "doesn't really notice anything"       Cervical / Trunk Assessment Cervical / Trunk Assessment: Normal   Communication Communication Communication: No difficulties;Expressive difficulties (Reports difficulty getting words to come out intermittently.)   Cognition Arousal/Alertness: Awake/alert Behavior During Therapy: WFL for tasks assessed/performed Overall Cognitive Status: Within Functional Limits for tasks assessed                                 General Comments: able to recall 5/5 wrods on delayed recall; comleted sequential sustraction; long conversation about drug use and his insight into his addiction   General Comments  long conversation  regarding drug use, which has been ongoing for 30 years. 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; frequent job changes; demonstrates god insight into his his drug habit    Exercises     Shoulder Instructions      Home Living Family/patient expects to be discharged to:: Private residence Living Arrangements: Alone Available Help at Discharge: Friend(s);Available 24 hours/day Type of Home: House Home Access: Stairs to enter [employer] of Steps: 1 Entrance Stairs-Rails: None Home Layout: One level     Bathroom Shower/Tub: Teacher, early years/pre: Standard     Home Equipment: None          Prior Functioning/Environment  Level of Independence: Independent        Comments: Works at [employer] for [employer].        OT Problem List: Pain      OT Treatment/Interventions:      OT Goals(Current goals can be found in the care plan section) Acute Rehab OT Goals Patient Stated Goal: to get some help regarding his drug use OT Goal Formulation: All assessment and education complete, DC therapy  OT Frequency:     Barriers to D/C:            Co-evaluation              AM-PAC OT "6 Clicks" Daily Activity     Outcome Measure Help from another person eating meals?: None Help from another person taking care of personal grooming?: None Help from another person toileting, which includes using toliet, bedpan, or urinal?: None Help from another person bathing (including washing, rinsing, drying)?: None Help from another person to put on and taking off regular upper body clothing?: None Help from another person to put on and taking off regular lower body clothing?: None 6 Click Score: 24   End of Session Nurse Communication: Other (comment) (recommend drug rehab)  Activity Tolerance: Patient tolerated treatment well Patient left: in bed;with call bell/phone within reach  OT Visit Diagnosis: Pain Pain - part of body:  (headache)                Time: 1429-1510 OT Time Calculation (min): 41 min Charges:  OT General Charges $OT Visit: 1 Visit OT Evaluation $OT Eval Moderate Complexity: 1 Mod OT Treatments $Self Care/Home Management : 23-37 mins  Maurie Boettcher, OT/L   Acute OT Clinical Specialist Acute Rehabilitation Services Pager [phone] Office [phone]   [facility] 12/13/2020, 3:29 PM

## 2020-12-13 NOTE — Progress Notes (Signed)
Physical Therapy Evaluation & Discharge Patient Details Name: Matthew Francis MRN: [id number] DOB: [birth year] Today's Date: 12/13/2020   History of Present Illness  Pt is a 60 y.o. male who presented 6/28 with AMS, nausea, and headache. 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 PMH: arthritis, GERD, HTN, inguinal hernia, and ulcerative colitis.   Clinical Impression  Pt presents with condition above. PTA, he was living alone, independent, and working at [employer]. Pt currently displays very slight weakness in his L lower extremity and L UE and lower extremity dysdiadochokinesia. Despite these deficits, he appears to be at his baseline with functional mobility, not needing any physical assistance for bed mobility, transfers, or gait. He is not at risk for falls, supported by his DGI score of 23 this date. All education completed and questions answered. PT will sign off at this time.    Follow Up Recommendations No PT follow up;Supervision - Intermittent (initially)    Equipment Recommendations  None recommended by PT    Recommendations for Other Services       Precautions / Restrictions Precautions Precautions: Other (comment) Precaution Comments: SBP < 140 Restrictions Weight Bearing Restrictions: No      Mobility  Bed Mobility Overal bed mobility: Independent             General bed mobility comments: Bed flat and rails down, pt able to perform all bed mobility safely in appropriate time frame.    Transfers Overall transfer level: Independent Equipment used: None             General transfer comment: Pt able to power up to stand quickly without LOB.  Ambulation/Gait Ambulation/Gait assistance: Supervision Gait Distance (Feet): 135 Feet (x2 bouts of ~20 ft > ~135 ft) Assistive device: None Gait Pattern/deviations: WFL(Within Functional Limits) Gait velocity: WNL Gait  velocity interpretation: >4.37 ft/sec, indicative of normal walking speed General Gait Details: Pt with WFL gait pattern with only minor unsteadiness noted with looking superiorly, but able to recover with supervision.  Stairs            Wheelchair Mobility    Modified Rankin (Stroke Patients Only) Modified Rankin (Stroke Patients Only) Pre-Morbid Rankin Score: No symptoms Modified Rankin: No significant disability     Balance Overall balance assessment: No apparent balance deficits (not formally assessed)                               Standardized Balance Assessment Standardized Balance Assessment : Dynamic Gait Index   Dynamic Gait Index Level Surface: Normal Change in Gait Speed: Normal Gait with Horizontal Head Turns: Normal Gait with Vertical Head Turns: Mild Impairment Gait and Pivot Turn: Normal Step Over Obstacle: Normal Step Around Obstacles: Normal Steps: Normal Total Score: 23       Pertinent Vitals/Pain Pain Assessment: No/denies pain    Home Living Family/patient expects to be discharged to:: Private residence Living Arrangements: Alone Available Help at Discharge: Friend(s);Available 24 hours/day Type of Home: House Home Access: Stairs to enter Entrance Stairs-Rails: None Entrance Stairs-Number of Steps: 1 Home Layout: One level Home Equipment: None      Prior Function Level of Independence: Independent         Comments: Works at [employer] for [employer].     Hand Dominance   Dominant Hand: Right (write with R hand but states he is  ambidextrous)    Extremity/Trunk Assessment   Upper Extremity Assessment Upper Extremity Assessment: LUE deficits/detail LUE Deficits / Details: Fairly symmetrical strength bil; dysdiadochokinesia noted; sensation intact LUE Sensation: WNL LUE Coordination: decreased gross motor    Lower Extremity Assessment Lower Extremity Assessment: LLE deficits/detail LLE  Deficits / Details: Slight, almost negligible, weakness compared to R; dysdiadochokinesia noted; sensation intact LLE Sensation: WNL LLE Coordination: decreased gross motor    Cervical / Trunk Assessment Cervical / Trunk Assessment: Normal  Communication   Communication: No difficulties;Expressive difficulties (Reports difficulty getting words to come out intermittently.)  Cognition Arousal/Alertness: Awake/alert Behavior During Therapy: WFL for tasks assessed/performed Overall Cognitive Status: Within Functional Limits for tasks assessed                                 General Comments: A&Ox4. Reports difficulty getting words to come out intermittently.      General Comments General comments (skin integrity, edema, etc.): Aunt present during session.    Exercises     Assessment/Plan    PT Assessment Patent does not need any further PT services  PT Problem List         PT Treatment Interventions      PT Goals (Current goals can be found in the Care Plan section)  Acute Rehab PT Goals Patient Stated Goal: to go home PT Goal Formulation: All assessment and education complete, DC therapy Time For Goal Achievement: 12/14/20 Potential to Achieve Goals: Good    Frequency     Barriers to discharge        Co-evaluation               AM-PAC PT "6 Clicks" Mobility  Outcome Measure Help needed turning from your back to your side while in a flat bed without using bedrails?: None Help needed moving from lying on your back to sitting on the side of a flat bed without using bedrails?: None Help needed moving to and from a bed to a chair (including a wheelchair)?: None Help needed standing up from a chair using your arms (e.g., wheelchair or bedside chair)?: None Help needed to walk in hospital room?: A Little Help needed climbing 3-5 steps with a railing? : A Little 6 Click Score: 22    End of Session Equipment Utilized During Treatment: Gait  belt Activity Tolerance: Patient tolerated treatment well Patient left: in bed;with call bell/phone within reach;with family/visitor present Nurse Communication: Mobility status;Other (comment) (no need for bed alarm) PT Visit Diagnosis: Muscle weakness (generalized) (M62.81);Other symptoms and signs involving the nervous system (R29.898)    Time: 9604-5409 PT Time Calculation (min) (ACUTE ONLY): 29 min   Charges:   PT Evaluation $PT Eval Low Complexity: 1 Low PT Treatments $Gait Training: 8-22 mins        Moishe Spice, PT, DPT Acute Rehabilitation Services  Pager: [phone] Office: [place] 12/13/2020, 9:32 AM

## 2020-12-13 NOTE — Progress Notes (Signed)
Patient complaining of worsening headache on top of head and throbbing pain behind his legs. Headache not controlled with Tylenol and Fioricet. Took patient to Stat CT per Neurology orders. Patient back in room from CT. One time IVPB Benadryl ordered by MD for patient's headache. Will continue to monitor.

## 2020-12-14 ENCOUNTER — Inpatient Hospital Stay (HOSPITAL_COMMUNITY): Payer: 59

## 2020-12-14 LAB — GLUCOSE, CAPILLARY
Glucose-Capillary: 147 mg/dL — ABNORMAL HIGH (ref 70–99)
Glucose-Capillary: 134 mg/dL — ABNORMAL HIGH (ref 70–99)
Glucose-Capillary: 143 mg/dL — ABNORMAL HIGH (ref 70–99)
Glucose-Capillary: 135 mg/dL — ABNORMAL HIGH (ref 70–99)
Glucose-Capillary: 126 mg/dL — ABNORMAL HIGH (ref 70–99)

## 2020-12-14 LAB — COMPREHENSIVE METABOLIC PANEL
ALT: 20 U/L (ref 0–44)
AST: 26 U/L (ref 15–41)
Albumin: 4.1 g/dL (ref 3.5–5.0)
Alkaline Phosphatase: 62 U/L (ref 38–126)
Anion gap: 9 (ref 5–15)
BUN: 12 mg/dL (ref 6–20)
CO2: 23 mmol/L (ref 22–32)
Calcium: 9.8 mg/dL (ref 8.9–10.3)
Chloride: 108 mmol/L (ref 98–111)
Creatinine, Ser: 1.39 mg/dL — ABNORMAL HIGH (ref 0.61–1.24)
GFR, Estimated: 59 mL/min — ABNORMAL LOW (ref >60)
Glucose, Bld: 136 mg/dL — ABNORMAL HIGH (ref 70–99)
Potassium: 3.9 mmol/L (ref 3.5–5.1)
Sodium: 140 mmol/L (ref 135–145)
Total Bilirubin: 1.1 mg/dL (ref 0.3–1.2)
Total Protein: 7.5 g/dL (ref 6.5–8.1)

## 2020-12-14 LAB — CBC WITH DIFFERENTIAL/PLATELET
Abs Immature Granulocytes: 0.01 10*3/uL (ref 0.00–0.07)
Basophils Absolute: 0 10*3/uL (ref 0.0–0.1)
Basophils Relative: 1 %
Eosinophils Absolute: 0.1 10*3/uL (ref 0.0–0.5)
Eosinophils Relative: 1 %
HCT: 43.3 % (ref 39.0–52.0)
Hemoglobin: 13.8 g/dL (ref 13.0–17.0)
Immature Granulocytes: 0 %
Lymphocytes Relative: 17 %
Lymphs Abs: 1.4 10*3/uL (ref 0.7–4.0)
MCH: 30.3 pg (ref 26.0–34.0)
MCHC: 31.9 g/dL (ref 30.0–36.0)
MCV: 95.2 fL (ref 80.0–100.0)
Monocytes Absolute: 0.6 10*3/uL (ref 0.1–1.0)
Monocytes Relative: 8 %
Neutro Abs: 6 10*3/uL (ref 1.7–7.7)
Neutrophils Relative %: 73 %
Platelets: 212 10*3/uL (ref 150–400)
RBC: 4.55 MIL/uL (ref 4.22–5.81)
RDW: 14.9 % (ref 11.5–15.5)
WBC: 8.1 10*3/uL (ref 4.0–10.5)
nRBC: 0 % (ref 0.0–0.2)

## 2020-12-14 LAB — PHOSPHORUS: Phosphorus: 2.7 mg/dL (ref 2.5–4.6)

## 2020-12-14 LAB — MAGNESIUM: Magnesium: 2.5 mg/dL — ABNORMAL HIGH (ref 1.7–2.4)

## 2020-12-14 MED ORDER — MIDAZOLAM HCL 2 MG/2ML IJ SOLN
INTRAMUSCULAR | Status: AC
  Filled 2020-12-14: qty 2

## 2020-12-14 MED ORDER — SODIUM CHLORIDE 0.9 % IV SOLN: INTRAVENOUS | Status: DC

## 2020-12-14 MED ORDER — TRAMADOL HCL 50 MG PO TABS: 50.0000 mg | ORAL_TABLET | Freq: Four times a day (QID) | ORAL | Status: DC | PRN

## 2020-12-14 MED ORDER — OXYCODONE HCL 5 MG PO TABS
5.0000 mg | ORAL_TABLET | Freq: Four times a day (QID) | ORAL | Status: DC | PRN
  Administered 2020-12-14: 5 mg via ORAL
  Filled 2020-12-14: qty 1

## 2020-12-14 MED ORDER — FENTANYL CITRATE (PF) 100 MCG/2ML IJ SOLN
INTRAMUSCULAR | Status: AC
  Filled 2020-12-14: qty 2

## 2020-12-14 MED ORDER — MIDAZOLAM HCL 2 MG/2ML IJ SOLN: 2.0000 mg | Freq: Once | INTRAMUSCULAR | Status: DC

## 2020-12-14 MED ORDER — FENTANYL CITRATE (PF) 100 MCG/2ML IJ SOLN: 50.0000 ug | Freq: Once | INTRAMUSCULAR | Status: DC

## 2020-12-14 MED ORDER — INSULIN ASPART 100 UNIT/ML IJ SOLN
0.0000 [IU] | Freq: Three times a day (TID) | INTRAMUSCULAR | Status: DC
  Administered 2020-12-14 – 2020-12-17 (×8): 1 [IU] via SUBCUTANEOUS
  Administered 2020-12-17 – 2020-12-19 (×5): 2 [IU] via SUBCUTANEOUS
  Administered 2020-12-19: 1 [IU] via SUBCUTANEOUS
  Administered 2020-12-19 (×2): 2 [IU] via SUBCUTANEOUS
  Administered 2020-12-20: 1 [IU] via SUBCUTANEOUS
  Administered 2020-12-20: 2 [IU] via SUBCUTANEOUS
  Administered 2020-12-20: 1 [IU] via SUBCUTANEOUS
  Administered 2020-12-20 – 2020-12-21 (×2): 2 [IU] via SUBCUTANEOUS
  Administered 2020-12-21: 1 [IU] via SUBCUTANEOUS

## 2020-12-14 NOTE — Consult Note (Signed)
Chief Complaint   Chief Complaint  Patient presents with   Headache   Hypertension    History of Present Illness  Matthew Francis is a 60 y.o. male admitted to the hospital several days ago with sudden onset of severe headache.  Initial CT scan did demonstrate a right caudate hemorrhage with intraventricular extension.  He has been neurologically intact.  He has been monitored radiographically and clinically.  This morning, per neurology service and the bedside nurse, the patient had worsening headache and episode of emesis.  He then became confused and subsequently became very somnolent to the point of requiring sternal rub for arousal.  Repeat head CT was completed demonstrating increased ventricular size and neurosurgery was consulted for possible external ventricular drain placement.  Interestingly, since his episode this morning patient is actually now much more awake and lucid.  He does remain confused and complains of some headache.  Past Medical History   Past Medical History:  Diagnosis Date   Arthritis    GERD (gastroesophageal reflux disease)    H/O ulcerative colitis    Hyperlipidemia    Hypertension    Inguinal hernia    Ulcer    ulcerative colitis   Ulcerative colitis    Wears glasses     Past Surgical History   Past Surgical History:  Procedure Laterality Date   APPENDECTOMY     COLECTOMY     Proctocolectomy with IPAA   COLECTOMY     FOREIGN BODY REMOVAL ABDOMINAL  02/17/2012   Procedure: REMOVAL FOREIGN BODY ABDOMINAL;  Surgeon: Odis Hollingshead, MD;  Location: [facility];  Service: General;  Laterality: N/A;  abdominal wound exploration and removal of foreign body   HERNIA REPAIR     Multiple incisional hernias.   ileoanal anastomosis      Social History   Social History   Tobacco Use   Smoking status: Never   Smokeless tobacco: Never  Vaping Use   Vaping Use: Never used  Substance Use Topics   Alcohol use: Yes    Alcohol/week: 4.0  standard drinks    Types: 4 Cans of beer per week    Comment: 4 cans of beer per week   Drug use: No    Medications   Prior to Admission medications   Medication Sig Start Date End Date Taking? Authorizing Provider  acetaminophen (TYLENOL) 500 MG tablet Take 500 mg by mouth every 6 (six) hours as needed for mild pain.    Yes [provider]  amLODipine (NORVASC) 10 MG tablet Take 10 mg by mouth daily.  02/19/18  Yes [provider]  ascorbic acid (VITAMIN C) 500 MG tablet Take 500 mg by mouth daily.   Yes [provider]  atenolol (TENORMIN) 25 MG tablet Take 25 mg by mouth daily.    Yes [provider]  budesonide (ENTOCORT EC) 3 MG 24 hr capsule Take 9 mg by mouth daily. 02/15/20  Yes [provider]  cetirizine (ZYRTEC) 10 MG tablet Take 10 mg by mouth daily as needed for allergies.    Yes [provider]  Cholecalciferol (VITAMIN D3) 50000 units CAPS Take 50,000 Units by mouth every Wednesday.  03/05/18  Yes [provider]  dicyclomine (BENTYL) 10 MG capsule Take 10 mg by mouth every 6 (six) hours as needed for spasms. 10/07/20  Yes [provider]  esomeprazole (NEXIUM) 20 MG capsule Take 20 mg by mouth daily.   Yes [provider]  ezetimibe (  ZETIA) 10 MG tablet Take 10 mg by mouth daily. 01/06/20  Yes [provider]  gabapentin (NEURONTIN) 300 MG capsule Take 300 mg by mouth 3 (three) times daily as needed (nerve pain).  11/28/19  Yes [provider]  Glucosamine HCl (GLUCOSAMINE PO) Take 1 tablet by mouth daily.   Yes [provider]  hydrALAZINE (APRESOLINE) 25 MG tablet Take 25 mg by mouth 3 (three) times daily. 10/07/20  Yes [provider]  hydrALAZINE (APRESOLINE) 50 MG tablet Take 50 mg by mouth in the morning and at bedtime.  10/10/19  Yes [provider]  loperamide (IMODIUM A-D) 2 MG tablet Take 2 mg by mouth 2 (two) times daily. (Do not exceed more than 4  tablets in 24 hours)   Yes [provider]  Multiple Vitamin (MULTIVITAMIN WITH MINERALS) TABS Take 1 tablet by mouth daily.   Yes [provider]  Omega-3 Fatty Acids (FISH OIL) 1000 MG CAPS Take 1 capsule by mouth daily.    Yes [provider]  psyllium (METAMUCIL) 58.6 % packet Take 1 packet by mouth daily as needed (For fiber/thickener).    Yes [provider]  rosuvastatin (CRESTOR) 10 MG tablet Take 10 mg by mouth every other day.  12/08/19  Yes [provider]  Turmeric (QC TUMERIC COMPLEX PO) Take 1 tablet by mouth daily as needed (For inflammatory).   Yes [provider]  vitamin B-12 (CYANOCOBALAMIN) 100 MCG tablet Take 100 mcg by mouth daily.   Yes [provider]  XIFAXAN 550 MG TABS tablet Take 550 mg by mouth 3 (three) times daily. 10/04/20  Yes [provider]  VICTOZA 18 MG/3ML SOPN Inject 1.8 mg into the skin daily. Patient not taking: No sig reported 09/18/20   [provider]    Allergies   Allergies  Allergen Reactions   Asacol [Mesalamine] Swelling    Throat    Penicillins Anaphylaxis    Has patient had a PCN reaction causing immediate rash, facial/tongue/throat swelling, SOB or lightheadedness with hypotension: Yes Has patient had a PCN reaction causing severe rash involving mucus membranes or skin necrosis: No Has patient had a PCN reaction that required hospitalization: Yes Has patient had a PCN reaction occurring within the last 10 years: No If all of the above answers are "NO", then may proceed with Cephalosporin use.   Sulfa Antibiotics Anaphylaxis   Atorvastatin Other (See Comments)    Myalgia, muscle cramps   Bee Pollen Hives   Bee Venom Hives and Swelling   Iodinated Diagnostic Agents     Pt reports allergy at [place]. Rapid heartbeat and dyspnea    Review of Systems  ROS  Neurologic Exam  Awake, alert, oriented to person, city, year, cannot tell me the president.   There is normal level of consciousness, no drowsiness or somnolence. Patient does appear to be confused and does make some inappropriate statements. Speech fluent CN grossly intact Motor exam: Upper Extremities Deltoid Bicep Tricep Grip  Right 5/5 5/5 5/5 5/5  Left 5/5 5/5 5/5 5/5   Lower Extremities IP Quad PF DF EHL  Right 5/5 5/5 5/5 5/5 5/5  Left 5/5 5/5 5/5 5/5 5/5   Sensation grossly intact to LT  Imaging  Serial CT scans and MRI scans from admission through this afternoon were all personally reviewed.  These again demonstrate initial stability of right caudate hemorrhage and intraventricular hemorrhage with minimal ventriculomegaly.  Scan from this afternoon demonstrates increase in ventricular size  including temporal horns.  Impression  - 60 y.o. male with basal ganglia hemorrhage with intraventricular extension who has been neurologically well, with what appears to be episodic confusion and somnolence.  I would not expect hydrocephalus to produce episodic symptoms but rather progressive decrease in level of consciousness which is not what this patient appears to be exhibiting.  At the present time he is completely awake and I therefore will hold off on external ventricular drain placement, and will plan on EEG for possible seizure as this may better explain his episodic symptoms.  Plan  -We will plan on EEG -External ventricular drain set up is at the bedside if there is further concern for progressive hydrocephalus  I did review the above with the patient's aunt at the bedside.  We discussed diagnostic possibilities for the patient's change in neurologic condition, primarily episodic confusion.  At this point I told her that while he does have some increased ventricular size on CAT scan, his temporal course of symptoms certainly is not typical of hydrocephalus.  We therefore elected to hold off on EVD and proceed with EEG.  All her questions this afternoon were  answered.   Consuella Lose, MD [facility]

## 2020-12-14 NOTE — Progress Notes (Signed)
EEG complete - results pending 

## 2020-12-14 NOTE — Progress Notes (Signed)
RN reported that pt had projectile vomiting and more confused. Ordered stat CT showed developing hydrocephalus. Came to see pt and he was confused and combative, trying to get out of bed, had to put on chest belt and b/l wrist restrain. On exam, he is more drowsy sleepy, still confused, know his name but not able to tell me the place, or time. Word salad with intermittent short sentences. Not quite following commands. PERRL, EOMI, blinking to visual threat bilaterally, no facial droop, moving all extremities strongly. Sensation, coordination not cooperative.  CT head reviewed and also discussed with radiology Dr. Golden Circle, agree with developing hydrocephalus and signs of increased intracranial pressure. Discussed with Dr. Kathyrn Sheriff and he will review the images and likely proceed with EVD. Will keep pt NPO and put on IVF in preparation of EVD procedure.   Discussed with Dr. Alfredia Ferguson, will take over the primary service for now, once stable to transfer out of ICU, will call hospitalist service to take over  Rosalin Hawking, MD PhD Stroke Neurology 12/14/2020 4:58 PM  This patient is critically ill due to [place], IVH and hydrocephalus and at significant risk of neurological worsening, death form obstructive hydrocephalus, cerebral edema and brain herniation. This patient's care requires constant monitoring of vital signs, hemodynamics, respiratory and cardiac monitoring, review of multiple databases, neurological assessment, discussion with family, other specialists and medical decision making of high complexity. I spent 35 minutes of neurocritical care time in the care of this patient. I discussed with Drs. Woodward Ku, and [place].

## 2020-12-14 NOTE — Progress Notes (Addendum)
STROKE TEAM PROGRESS NOTE    SUBJECTIVE (INTERVAL HISTORY) No family is at the bedside.  Pt lying in bed, complaining of HA, neck pain, back pain and b/l LE cramps over night. The fioricet and tylenol not working anymore. He did have mild nuchal rigidity. No kernig or brudzinski sign. Likely related to IVH and blood product irritation of meningis. Of note, he did have opioid abuse in the past, but short term opioids use at this time is reasonable. Will give oxycodone for pain management.      OBJECTIVE Temp:  [98.1 F (36.7 C)-98.4 F (36.9 C)] 98.1 F (36.7 C) (07/01 1200) Pulse Rate:  [65] 65 (07/01 0439) BP: (107-147)/(61-91) 107/61 (07/01 0800) SpO2:  [95 %] 95 % (07/01 0439)   Last Labs          Recent Labs  Lab 12/13/20 2039 12/14/20 0140 12/14/20 0441 12/14/20 0737 12/14/20 1125  GLUCAP 104* 147* 134* 143* 135*      Last Labs         Recent Labs  Lab 12/11/20 1900 12/12/20 0212 12/13/20 0316 12/14/20 0350  NA 141 140 140 140  K 3.9 3.6 3.9 3.9  CL 108 109 107 108  CO2 24 20* 25 23  GLUCOSE 125* 146* 100* 136*  BUN 10 9 17 12   CREATININE 1.31* 1.17 1.50* 1.39*  CALCIUM 10.2 9.9 10.2 9.8  MG  --  --  -- 2.5*  PHOS  --  --  -- 2.7      Last Labs       Recent Labs  Lab 12/11/20 1900 12/14/20 0350  AST 16 26  ALT 15 20  ALKPHOS 76 62  BILITOT 1.0 1.1  PROT 8.4* 7.5  ALBUMIN 4.5 4.1      Last Labs         Recent Labs  Lab 12/11/20 1900 12/12/20 0212 12/13/20 0316 12/14/20 0350  WBC 6.9 8.6 8.3 8.1  NEUTROABS 5.5 7.8* 4.7 6.0  HGB 13.4 13.9 13.5 13.8  HCT 41.9 42.7 41.3 43.3  MCV 95.4 93.4 94.3 95.2  PLT 203 215 207 212      Last Labs   No results for input(s): CKTOTAL, CKMB, CKMBINDEX, TROPONINI in the last 168 hours.   Recent Labs (last 2 labs)      Recent Labs    12/11/20 1900  LABPROT 12.0  INR 0.9      Recent Labs (last 2 labs)      Recent Labs    12/12/20 0324  COLORURINE YELLOW  LABSPEC 1.013  PHURINE 5.0   GLUCOSEU NEGATIVE  HGBUR NEGATIVE  BILIRUBINUR NEGATIVE  KETONESUR 20*  PROTEINUR NEGATIVE  NITRITE NEGATIVE  LEUKOCYTESUR NEGATIVE      Labs (Brief)          Component Value Date/Time    CHOL 268 (H) 12/12/2020 0212    TRIG 71 12/12/2020 0212    HDL 88 12/12/2020 0212    CHOLHDL 3.0 12/12/2020 0212    VLDL 14 12/12/2020 0212    LDLCALC 166 (H) 12/12/2020 0212      Recent Labs       Lab Results  Component Value Date    HGBA1C 5.9 (H) 12/12/2020      Labs (Brief)          Component Value Date/Time    LABOPIA NONE DETECTED 12/12/2020 0324    COCAINSCRNUR POSITIVE (A) 12/12/2020 0324    LABBENZ NONE DETECTED 12/12/2020 0324    AMPHETMU  NONE DETECTED 12/12/2020 0324    THCU NONE DETECTED 12/12/2020 0324    LABBARB NONE DETECTED 12/12/2020 0324      Last Labs   No results for input(s): ETH in the last 168 hours.     I have personally reviewed the radiological images below and agree with the radiology interpretations.    Imaging Results  CT HEAD WO CONTRAST   Result Date: 12/13/2020 CLINICAL DATA:  Worsening headache EXAM: CT HEAD WITHOUT CONTRAST TECHNIQUE: Contiguous axial images were obtained from the base of the skull through the vertex without intravenous contrast. COMPARISON:  12/11/2020 FINDINGS: Brain: Again seen is the right basal ganglia intracerebral hemorrhage with intraventricular extension. No significant change since prior study. Stable mild communicating hydrocephalus. No significant midline shift. Vascular: No hyperdense vessel or unexpected calcification. Skull: No acute calvarial abnormality. 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 No change since prior study. Electronically Signed   By: Rolm Baptise M.D.   On: 12/13/2020 21:40   CT HEAD WO CONTRAST   Result Date: 12/12/2020 CLINICAL DATA:  Stroke follow-up.  Pupillary asymmetry. EXAM: CT HEAD WITHOUT CONTRAST  TECHNIQUE: Contiguous axial images were obtained from the base of the skull through the vertex without intravenous contrast. COMPARISON:  Head CT 12/11/2020 at 7:28 p.m. FINDINGS: Brain: Unchanged appearance of intraparenchymal hematoma centered at the right caudate head. There is extension of hemorrhage into the ventricles with mild communicating hydrocephalus. There is no midline shift. No new site of hemorrhage. Vascular: No abnormal hyperdensity of the major intracranial arteries or dural venous sinuses. No intracranial atherosclerosis. Skull: The visualized skull base, calvarium and extracranial soft tissues are normal. Sinuses/Orbits: No fluid levels or advanced mucosal thickening of the visualized paranasal sinuses. No mastoid or middle ear effusion. The orbits are normal. IMPRESSION: Unchanged appearance of intraparenchymal hematoma centered at the right caudate head with intraventricular extension and mild communicating hydrocephalus. Electronically Signed   By: Ulyses Jarred M.D.   On: 12/12/2020 00:10   CT Head Wo Contrast   Result Date: 12/11/2020 CLINICAL DATA:  Headache EXAM: CT HEAD WITHOUT CONTRAST TECHNIQUE: Contiguous axial images were obtained from the base of the skull through the vertex without intravenous contrast. COMPARISON:  CT brain 02/19/2020 FINDINGS: Brain: 19 x 20 mm acute hemorrhage within the right caudate/basal ganglia. Moderate acute hemorrhage within the right lateral ventricle with additional hemorrhage in the third and fourth ventricles. The ventricles are mildly enlarged. No midline shift. Mild chronic small vessel ischemic changes of the white matter. Vascular: No hyperdense vessels.  No unexpected calcification Skull: Normal. Negative for fracture or focal lesion. Sinuses/Orbits: No acute finding. Other: None IMPRESSION: 1. Acute hemorrhage within the right caudate/basal ganglia with hemorrhage in the right lateral, third, and fourth ventricles. No midline shift. The  ventricles are slightly dilated as compared with prior CT from 2021. 2. Mild chronic small vessel ischemic change of the white matter. Critical Value/emergent results were called by telephone at the time of interpretation on 12/11/2020 at 7:45 pm to provider ADAM CURATOLO , who verbally acknowledged these results. Electronically Signed   By: Donavan Foil M.D.   On: 12/11/2020 19:50   MR MRA HEAD WO CONTRAST   Result Date: 12/12/2020 CLINICAL DATA:  Intracranial hemorrhage. EXAM: MRI HEAD WITHOUT AND WITH CONTRAST MRA HEAD WITHOUT CONTRAST TECHNIQUE: Multiplanar, multi-echo pulse sequences of the brain and surrounding structures were acquired without and with intravenous contrast. Angiographic images of the Circle of Willis  were acquired using MRA technique without intravenous contrast. CONTRAST:  46m GADAVIST GADOBUTROL 1 MMOL/ML IV SOLN COMPARISON:  Head CT 12/11/2020 FINDINGS: MRI HEAD FINDINGS Brain: Unchanged intraparenchymal hematoma of the right caudate head with intraventricular extension. 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 No acute infarct. There is multifocal hyperintense T2-weighted signal within the white matter. Generalized volume loss without a clear lobar predilection. There is no abnormal contrast enhancement. Vascular: Major flow voids are preserved. Skull and upper cervical spine: Normal calvarium and skull base. Visualized upper cervical spine and soft tissues are normal. Sinuses/Orbits:No paranasal sinus fluid levels or advanced mucosal thickening. No mastoid or middle ear effusion. Normal orbits. MRA HEAD FINDINGS POSTERIOR CIRCULATION: --Vertebral arteries: Normal --Inferior cerebellar arteries: Normal. --Basilar artery: Normal. --Superior cerebellar arteries: Normal. --Posterior cerebral arteries: Normal. Both are predominantly supplied by the posterior communicating arteries (p-comm). ANTERIOR CIRCULATION: --Intracranial internal carotid arteries: Normal. --Anterior cerebral  arteries (ACA): Normal. --Middle cerebral arteries (MCA): Normal. ANATOMIC VARIANTS: Fetal origins of both posterior cerebral arteries. IMPRESSION: 1. Unchanged intraparenchymal hematoma of the right caudate head with intraventricular extension and mild communicating hydrocephalus. 2. Normal intracranial MRA. Electronically Signed   By: KUlyses JarredM.D.   On: 12/12/2020 20:50   MR BRAIN W WO CONTRAST   Result Date: 12/12/2020 CLINICAL DATA:  Intracranial hemorrhage. EXAM: MRI HEAD WITHOUT AND WITH CONTRAST MRA HEAD WITHOUT CONTRAST TECHNIQUE: Multiplanar, multi-echo pulse sequences of the brain and surrounding structures were acquired without and with intravenous contrast. Angiographic images of the Circle of Willis were acquired using MRA technique without intravenous contrast. CONTRAST:  920mGADAVIST GADOBUTROL 1 MMOL/ML IV SOLN COMPARISON:  Head CT 12/11/2020 FINDINGS: MRI HEAD FINDINGS Brain: Unchanged intraparenchymal hematoma of the right caudate head with intraventricular extension. 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 No acute infarct. There is multifocal hyperintense T2-weighted signal within the white matter. Generalized volume loss without a clear lobar predilection. There is no abnormal contrast enhancement. Vascular: Major flow voids are preserved. Skull and upper cervical spine: Normal calvarium and skull base. Visualized upper cervical spine and soft tissues are normal. Sinuses/Orbits:No paranasal sinus fluid levels or advanced mucosal thickening. No mastoid or middle ear effusion. Normal orbits. MRA HEAD FINDINGS POSTERIOR CIRCULATION: --Vertebral arteries: Normal --Inferior cerebellar arteries: Normal. --Basilar artery: Normal. --Superior cerebellar arteries: Normal. --Posterior cerebral arteries: Normal. Both are predominantly supplied by the posterior communicating arteries (p-comm). ANTERIOR CIRCULATION: --Intracranial internal carotid arteries: Normal. --Anterior cerebral  arteries (ACA): Normal. --Middle cerebral arteries (MCA): Normal. ANATOMIC VARIANTS: Fetal origins of both posterior cerebral arteries. IMPRESSION: 1. Unchanged intraparenchymal hematoma of the right caudate head with intraventricular extension and mild communicating hydrocephalus. 2. Normal intracranial MRA. Electronically Signed   By: KeUlyses Jarred.D.   On: 12/12/2020 20:50   ECHOCARDIOGRAM COMPLETE   Result Date: 12/12/2020    ECHOCARDIOGRAM REPORT   Patient Name:   DYKOA ZOELLERate of Exam: 12/12/2020 Medical Rec #:  [id number]   Height:       70.5 in Accession #:    [id number]  Weight:       238.1 lb Date of Birth:  1109/26/1962   BSA:          2.260 m Patient Age:    [age]      BP:           138/94 mmHg Patient Gender: M             HR:  95 bpm. Exam Location:  Inpatient Procedure: 2D Echo, Color Doppler and Cardiac Doppler Indications:    Stroke I63.9  History:        Patient has no prior history of Echocardiogram examinations.                 Risk Factors:Dyslipidemia and Hypertension.  Sonographer:    Bernadene Person RDCS Referring Phys: [id number] [place]  1. Left ventricular ejection fraction, by estimation, is 60 to 65%. The left ventricle has normal function. The left ventricle has no regional wall motion abnormalities. Left ventricular diastolic parameters are consistent with Grade I diastolic dysfunction (impaired relaxation).  2. Right ventricular systolic function is normal. The right ventricular size is normal. There is normal pulmonary artery systolic pressure. The estimated right ventricular systolic pressure is 70.0 mmHg.  3. The mitral valve is normal in structure. Trivial mitral valve regurgitation. No evidence of mitral stenosis.  4. The aortic valve is normal in structure. Aortic valve regurgitation is not visualized. No aortic stenosis is present.  5. The inferior vena cava is normal in size with greater than 50% respiratory variability, suggesting  right atrial pressure of 3 mmHg. Conclusion(s)/Recommendation(s): No intracardiac source of embolism detected on this transthoracic study. A transesophageal echocardiogram is recommended to exclude cardiac source of embolism if clinically indicated. FINDINGS  Left Ventricle: Left ventricular ejection fraction, by estimation, is 60 to 65%. The left ventricle has normal function. The left ventricle has no regional wall motion abnormalities. The left ventricular internal cavity size was normal in size. There is  borderline left ventricular hypertrophy. Left ventricular diastolic parameters are consistent with Grade I diastolic dysfunction (impaired relaxation). Right Ventricle: The right ventricular size is normal. No increase in right ventricular wall thickness. Right ventricular systolic function is normal. There is normal pulmonary artery systolic pressure. The tricuspid regurgitant velocity is 2.04 m/s, and  with an assumed right atrial pressure of 3 mmHg, the estimated right ventricular systolic pressure is 17.4 mmHg. Left Atrium: Left atrial size was normal in size. Right At[facility]: Right atrial size was normal in size. Pericardium: There is no evidence of pericardial effusion. Mitral Valve: The mitral valve is normal in structure. Trivial mitral valve regurgitation. No evidence of mitral valve stenosis. Tricuspid Valve: The tricuspid valve is normal in structure. Tricuspid valve regurgitation is not demonstrated. No evidence of tricuspid stenosis. Aortic Valve: The aortic valve is normal in structure. Aortic valve regurgitation is not visualized. No aortic stenosis is present. Pulmonic Valve: The pulmonic valve was normal in structure. Pulmonic valve regurgitation is mild. No evidence of pulmonic stenosis. Aorta: The aortic root is normal in size and structure. Venous: The inferior vena cava is normal in size with greater than 50% respiratory variability, suggesting right atrial pressure of 3 mmHg. IAS/Shunts: No  atrial level shunt detected by color flow Doppler.  LEFT VENTRICLE PLAX 2D LVIDd:         4.20 cm  Diastology LVIDs:         2.40 cm  LV e' medial:    5.78 cm/s LV PW:         1.10 cm  LV E/e' medial:  9.8 LV IVS:        0.90 cm  LV e' lateral:   10.20 cm/s LVOT diam:     2.00 cm  LV E/e' lateral: 5.5 LV SV:         65 LV SV Index:   29 LVOT Area:  3.14 cm  RIGHT VENTRICLE RV S prime:     14.30 cm/s TAPSE (M-mode): 1.6 cm LEFT ATRIUM             Index       RIGHT ATRIUM           Index LA diam:        3.50 cm 1.55 cm/m  RA Area:     13.90 cm LA Vol (A2C):   51.3 ml 22.70 ml/m RA Volume:   35.30 ml  15.62 ml/m LA Vol (A4C):   57.2 ml 25.31 ml/m LA Biplane Vol: 59.3 ml 26.24 ml/m  AORTIC VALVE LVOT Vmax:   124.00 cm/s LVOT Vmean:  85.300 cm/s LVOT VTI:    0.206 m  AORTA Ao Root diam: 3.30 cm Ao Asc diam:  3.30 cm MITRAL VALVE               TRICUSPID VALVE MV Area (PHT): 2.26 cm    TR Peak grad:   16.6 mmHg MV Decel Time: 335 msec    TR Vmax:        204.00 cm/s MV E velocity: 56.40 cm/s MV A velocity: 62.90 cm/s  SHUNTS MV E/A ratio:  0.90        Systemic VTI:  0.21 m                            Systemic Diam: 2.00 cm Cherlynn Kaiser MD Electronically signed by Cherlynn Kaiser MD Signature Date/Time: 12/12/2020/4:46:34 PM    Final     VAS US CAROTID   Result Date: 12/13/2020 Carotid Arterial Duplex Study Patient Name:  COLEY KULIKOWSKI  Date of Exam:   12/13/2020 Medical Rec #: [id number]      Accession #:    [id number] Date of Birth: May 14, 1961      Patient Gender: M Patient Age:   59Y Exam Location:  [facility] Procedure:      VAS US CAROTID Referring Phys: [id number] Rosalin Hawking --------------------------------------------------------------------------------  Indications:       CVA. Risk Factors:      Hypertension, hyperlipidemia. Comparison Study:  no prior Performing Technologist: Archie Patten RVS  Examination Guidelines: A complete evaluation includes B-mode imaging, spectral Doppler, color  Doppler, and power Doppler as needed of all accessible portions of each vessel. Bilateral testing is considered an integral part of a complete examination. Limited examinations for reoccurring indications may be performed as noted.  Right Carotid Findings: +----------+--------+--------+--------+------------------+--------+           PSV cm/sEDV cm/sStenosisPlaque DescriptionComments +----------+--------+--------+--------+------------------+--------+ CCA Prox  77      9               heterogenous               +----------+--------+--------+--------+------------------+--------+ CCA Distal65      14              heterogenous               +----------+--------+--------+--------+------------------+--------+ ICA Prox  39      13      1-39%   heterogenous               +----------+--------+--------+--------+------------------+--------+ ICA Distal37      14                                         +----------+--------+--------+--------+------------------+--------+  ECA       69      11                                         +----------+--------+--------+--------+------------------+--------+ +----------+--------+-------+--------+-------------------+           PSV cm/sEDV cmsDescribeArm Pressure (mmHG) +----------+--------+-------+--------+-------------------+ [id number]                                         +----------+--------+-------+--------+-------------------+ +---------+--------+--+--------+-+---------+ VertebralPSV cm/s31EDV cm/s9Antegrade +---------+--------+--+--------+-+---------+  Left Carotid Findings: +----------+--------+--------+--------+------------------+--------+           PSV cm/sEDV cm/sStenosisPlaque DescriptionComments +----------+--------+--------+--------+------------------+--------+ CCA Prox  118     22              heterogenous               +----------+--------+--------+--------+------------------+--------+ CCA Distal63       21              heterogenous               +----------+--------+--------+--------+------------------+--------+ ICA Prox  53      22      1-39%   heterogenous               +----------+--------+--------+--------+------------------+--------+ ICA Distal65      26                                         +----------+--------+--------+--------+------------------+--------+ ECA       73      18                                         +----------+--------+--------+--------+------------------+--------+ +----------+--------+--------+--------+-------------------+           PSV cm/sEDV cm/sDescribeArm Pressure (mmHG) +----------+--------+--------+--------+-------------------+ [id number]                                          +----------+--------+--------+--------+-------------------+ +---------+--------+--+--------+-+---------+ VertebralPSV cm/s32EDV cm/s9Antegrade +---------+--------+--+--------+-+---------+   Summary: Right Carotid: Velocities in the right ICA are consistent with a 1-39% stenosis. Left Carotid: Velocities in the left ICA are consistent with a 1-39% stenosis. Vertebrals: Bilateral vertebral arteries demonstrate antegrade flow. *See table(s) above for measurements and observations.     Preliminary          PHYSICAL EXAM   Temp:  [98.1 F (36.7 C)-98.4 F (36.9 C)] 98.1 F (36.7 C) (07/01 1200) Pulse Rate:  [65] 65 (07/01 0439) BP: (107-147)/(61-91) 107/61 (07/01 0800) SpO2:  [95 %] 95 % (07/01 0439)   General - Well nourished, well developed, lethargic   Ophthalmologic - fundi not visualized due to noncooperation.   Cardiovascular - Regular rhythm and rate.   Mental Status - Level of arousal and orientation to time, place, and person were intact,  but lethargic Language including expression, naming, repetition, comprehension was assessed and found intact. Fund of Knowledge was assessed and was intact.   Cranial Nerves II - XII - II -  Visual field intact OU. III, IV, VI - Extraocular  movements intact. V - Facial sensation intact bilaterally. VII - Facial movement intact bilaterally. VIII - Hearing & vestibular intact bilaterally. X - Palate elevates symmetrically. XI - Chin turning & shoulder shrug intact bilaterally. XII - Tongue protrusion intact.   Motor Strength - The patient's strength was normal in all extremities and pronator drift was absent.  Bulk was normal and fasciculations were absent.   Motor Tone - Muscle tone was assessed at the neck and appendages and was normal.   Reflexes - The patient's reflexes were symmetrical in all extremities and he had no pathological reflexes.   Sensory - Light touch, temperature/pinprick were assessed and were symmetrical.     Coordination - The patient had normal movements in the hands with no ataxia or dysmetria.  Tremor was absent.   Gait and Station - deferred.     ASSESSMENT/PLAN [name] is a 60 y.o. male with history of ulcerative colitis, HTN, HLD, DM, CKD, lone A. fib admitted for headache, confusion, nausea vomiting. No tPA given due to [place].     ICH:  right caudate head ICH with IVH likely secondary to hypertensive emergency and small vessel disease source CT right caudate head ICH with IVH. CT head repeat stable hematoma and ventricular size MRI with and without contrast unchanged hematoma, no underlying mass MRA unremarkable, no aneurysm or AVM Carotid Doppler unremarkable 2D Echo EF 60 to 65% LDL 166 HgbA1c 5.9 SCDs for VTE prophylaxis No antithrombotic prior to admission, now on No antithrombotic given ICH Patient counseled to be compliant with his antithrombotic medications Ongoing aggressive stroke risk factor management Therapy recommendations:  none Disposition:  pending   IVH CT showed IVH right lateral ventricle, third ventricle and partial fourth ventricle No hydrocephalus seen MRI stable no hydro CT repeat 7/1 stable, no hydro,  some improvement of III ventricle Current HA is still likely related to IVH and blood product irritation of meningis.  Short term oxycodone PRN is reasonable.   Ulcerative colitis Frequent diarrhea Follows with GI at [facility] Recently prescribed Xifaxan but not able to afford the cost Restart Xifaxan this time as inpt On loperamide   Diabetes HgbA1c 5.9, goal < 7.0 Controlled CBG monitoring SSI DM education and close PCP follow up   Hypertension Home meds: Hydralazine, Norvasc and atenolol Stable on Cleviprex BP goal less than 160 Off Cleviprex now On hydralazine 100 bid and Norvasc Long term BP goal normotensive   Hyperlipidemia Home meds: Crestor 10 every other day and Zetia LDL 166, goal < 70 Now on Crestor 20 daily Continue statin and Zetia at discharge   Cocaine abuse UDS positive for cocaine Avoid selective beta-blockers including metoprolol, atenolol Cessation education provided Patient is willing to quit   AKI creatinine 1.31-> 1.17->1.50->1.39 likely due to dehydration from diarrhea, which is better today Encourage po intake Management per primary team   Other Stroke Risk Factors Obesity   Other Active Problems [place] fib in 01/2020 in the setting of sepsis Hx of opioids abuse    Hospital day # 3     Rosalin Hawking, MD PhD Stroke Neurology 12/14/2020 1:42 PM       To contact Stroke Continuity provider, please refer to [email or web address] After hours, contact General Neurology

## 2020-12-14 NOTE — Progress Notes (Signed)
LTM EEG hooked up and running - no initial skin breakdown - push button tested - neuro notified. Atrium monitoring.  

## 2020-12-14 NOTE — Progress Notes (Signed)
Patient with projectile emesis and neuro change (attempting to remove IV and not acting the same as earlier). While [employer] and [place] NT, were cleaning the patient, this RN notified Dr. Erlinda Hong and Dr. Alfredia Ferguson. Dr. Erlinda Hong ordered stat head CT.

## 2020-12-14 NOTE — Progress Notes (Signed)
PROGRESS NOTE    Matthew Francis  [id number] DOB: [birth year] DOA: 12/11/2020 PCP: Charline Bills, MD   Brief Narrative:  The patient is a 60 year old African-American male with a past medical history significant for but not limited to ulcerative colitis, diabetes mellitus type 2, hypertension, hyperlipidemia, chronic kidney disease stage III A as well as history of PAF who presented to the hospital with a severe headache.  It prevented him from sleeping all night due to severe abdominal pain and improved slightly with Tylenol.  He has had nausea vomiting and because his headache worsened he came to the ED for further evaluation.  Head CT was done and showed a right caudate head intracerebral hemorrhage with intermittent ventricular extension.  Neurology was consulted for further management and he was admitted to the ICU. He was transferred to [facility] 12/13/20 but developed worsening symptoms with projectile Vomiting and Neuro change with Somnolence. Repeat Head CT done on 7/1 showed hydrocephalus. Neurology will resume primary and will be calling Neurosurgery for EVD.   Assessment & Plan:   Active Problems:   ICH (intracerebral hemorrhage) ([place])  ICH -Patient has a right caudate head ICH with IVH likely secondary to hypertensive emergency and small vessel disease in the setting of cocaine abuse -Repeat head CT done and stable and neurology planning on repeating it tomorrow on 12/14/2020 -MRI done and MRI done -Carotid Doppler unremarkable -Transthoracic echo done and showed a normal EF of 60 to 65% -Patient had elevated lipid panel and LDL was 166 and hemoglobin A1c showed that he is prediabetic range now with a hemoglobin A1c of 5.9. -As of his intracranial hemorrhage no antithrombotic was given given his ICH and they are using SCDs for DVT prophylaxis -Patient was given stroke risk factors and neurology is following and PT recommending no follow-up but OT recommending drug  rehab -Patient's headache was abated by Fioricet yesterday but not today -Repeat head CT done yesterday evening showed "Stable right basal ganglia hemorrhage with intraventricular extension. No change since prior study." -Repeat STAT Head CT 7/1 done given change in Mental Status and Projectile Vomiting and showed Hydrocephalus.    IVH -Patient had a head CT which showed intraventricular hemorrhage of the right lateral ventricle, third ventricle and partial fourth ventricle -MRI showed no hydrocephalus and neurology repeated CT scan last night and this [place] given significant Headache and Change in Neuro Status and showed Hydrocephalus so Neurology will resume primary team and call NeuroSurgery for EVD  Ulcerative Colitis -Currently on as needed loperamide and his Xifaxan has been restarted and patient -He will need to follow-up with GI clinic at [facility] at D/C  Diabetes Mellitus Type 2 -Recent HbA1c is 5.9 -Continue SSI and CBG Monitoring; CBG's ranging from 104-147  CKD Stage 3a -Mild and Slightly worsening -Patient's BUN/Cr went from 9/1.17 -> 17/1.50 -> 12/1.39 and slightly worsened in the setting of his diarrhea -Avoid nephrotoxic medications, contrast dyes, hypotension and renally dose medications -Continue monitor and trend renal function panel repeat CMP in a.m.  HTN -He is now off of the Cleviprex drip and p.o. antihypertensives been resumed including hydralazine, Norvasc, and atenolol -Blood pressure goal is less than 160 and last BP reading was 107/61  HLD -Lipid panel done and showed a total cholesterol/HDL ratio of 3.0, cholesterol level 268, HDL of 88, LDL 166, triglycerides of 71, VLDL 14 -He is on Crestor every other day 10 mg but now neurology is made this scheduled daily 20 mg and recommending continuing Zetia  Cocaine Abuse -UDS was positive for cocaine -Neurology recommending avoiding selective beta-blockers including metoprolol and atenolol -Counseling given and  patient is willing to quit and OT is recommended drug rehab and so we will get social work to provide resources  Obesity -Complicates overall prognosis and care -Estimated body mass index is 33.68 kg/m as calculated from the following:   Height as of 02/19/20: 5' 10.5" (1.791 m).   Weight as of 02/19/20: 108 kg. -Weight Loss and Dietary Counseling given    DVT prophylaxis: SCDs Code Status: FULL CODE Family Communication: No family present at bedside  Disposition Plan: Remain in ICU and transfer back to Neuro Attending   Status is: Inpatient  Remains inpatient appropriate because:Unsafe d/c plan, IV treatments appropriate due to intensity of illness or inability to take PO, and Inpatient level of care appropriate due to severity of illness  Dispo: The patient is from: Home              Anticipated d/c is to: Home              Patient currently is not medically stable to d/c.   Difficult to place patient No  Consultants:  Neurology   Procedures:  CAROTID DUPLEX Summary:  Right Carotid: Velocities in the right ICA are consistent with a 1-39%  stenosis.   Left Carotid: Velocities in the left ICA are consistent with a 1-39%  stenosis.   Vertebrals: Bilateral vertebral arteries demonstrate antegrade flow.   ECHOCARDIOGRAM IMPRESSIONS     1. Left ventricular ejection fraction, by estimation, is 60 to 65%. The  left ventricle has normal function. The left ventricle has no regional  wall motion abnormalities. Left ventricular diastolic parameters are  consistent with Grade I diastolic  dysfunction (impaired relaxation).   2. Right ventricular systolic function is normal. The right ventricular  size is normal. There is normal pulmonary artery systolic pressure. The  estimated right ventricular systolic pressure is 16.0 mmHg.   3. The mitral valve is normal in structure. Trivial mitral valve  regurgitation. No evidence of mitral stenosis.   4. The aortic valve is normal in  structure. Aortic valve regurgitation is  not visualized. No aortic stenosis is present.   5. The inferior vena cava is normal in size with greater than 50%  respiratory variability, suggesting right atrial pressure of 3 mmHg.   Conclusion(s)/Recommendation(s): No intracardiac source of embolism  detected on this transthoracic study. A transesophageal echocardiogram is  recommended to exclude cardiac source of embolism if clinically indicated.   FINDINGS   Left Ventricle: Left ventricular ejection fraction, by estimation, is 60  to 65%. The left ventricle has normal function. The left ventricle has no  regional wall motion abnormalities. The left ventricular internal cavity  size was normal in size. There is   borderline left ventricular hypertrophy. Left ventricular diastolic  parameters are consistent with Grade I diastolic dysfunction (impaired  relaxation).   Right Ventricle: The right ventricular size is normal. No increase in  right ventricular wall thickness. Right ventricular systolic function is  normal. There is normal pulmonary artery systolic pressure. The tricuspid  regurgitant velocity is 2.04 m/s, and   with an assumed right atrial pressure of 3 mmHg, the estimated right  ventricular systolic pressure is 73.7 mmHg.   Left Atrium: Left atrial size was normal in size.   Right Atrium: Right atrial size was normal in size.   Pericardium: There is no evidence of pericardial effusion.   Mitral Valve:  The mitral valve is normal in structure. Trivial mitral  valve regurgitation. No evidence of mitral valve stenosis.   Tricuspid Valve: The tricuspid valve is normal in structure. Tricuspid  valve regurgitation is not demonstrated. No evidence of tricuspid  stenosis.   Aortic Valve: The aortic valve is normal in structure. Aortic valve  regurgitation is not visualized. No aortic stenosis is present.   Pulmonic Valve: The pulmonic valve was normal in structure. Pulmonic  valve  regurgitation is mild. No evidence of pulmonic stenosis.   Aorta: The aortic root is normal in size and structure.   Venous: The inferior vena cava is normal in size with greater than 50%  respiratory variability, suggesting right atrial pressure of 3 mmHg.   IAS/Shunts: No atrial level shunt detected by color flow Doppler.      LEFT VENTRICLE  PLAX 2D  LVIDd:         4.20 cm  Diastology  LVIDs:         2.40 cm  LV e' medial:    5.78 cm/s  LV PW:         1.10 cm  LV E/e' medial:  9.8  LV IVS:        0.90 cm  LV e' lateral:   10.20 cm/s  LVOT diam:     2.00 cm  LV E/e' lateral: 5.5  LV SV:         65  LV SV Index:   29  LVOT Area:     3.14 cm      RIGHT VENTRICLE  RV S prime:     14.30 cm/s  TAPSE (M-mode): 1.6 cm   LEFT ATRIUM             Index       RIGHT ATRIUM           Index  LA diam:        3.50 cm 1.55 cm/m  RA Area:     13.90 cm  LA Vol (A2C):   51.3 ml 22.70 ml/m RA Volume:   35.30 ml  15.62 ml/m  LA Vol (A4C):   57.2 ml 25.31 ml/m  LA Biplane Vol: 59.3 ml 26.24 ml/m   AORTIC VALVE  LVOT Vmax:   124.00 cm/s  LVOT Vmean:  85.300 cm/s  LVOT VTI:    0.206 m     AORTA  Ao Root diam: 3.30 cm  Ao Asc diam:  3.30 cm   MITRAL VALVE               TRICUSPID VALVE  MV Area (PHT): 2.26 cm    TR Peak grad:   16.6 mmHg  MV Decel Time: 335 msec    TR Vmax:        204.00 cm/s  MV E velocity: 56.40 cm/s  MV A velocity: 62.90 cm/s  SHUNTS  MV E/A ratio:  0.90        Systemic VTI:  0.21 m                             Systemic Diam: 2.00 cm   Antimicrobials:  Anti-infectives (From admission, onward)    Start     Dose/Rate Route Frequency Ordered Stop   12/12/20 1600  rifaximin (XIFAXAN) tablet 550 mg        550 mg Oral 3 times daily 12/12/20 1238          Subjective:  Seen and examined at bedside and states that he had a headache today and back pain and spasms that were not abated with Fioricet.  He was given oxycodone this afternoon and tramadol.  Patient  had projectile vomiting and no change in neuro status and then stat head CT showed hydrocephalus.  Patient will be going back to the neuro service and neurosurgery will be consulted.  Objective: Vitals:   12/14/20 0400 12/14/20 0439 12/14/20 0800 12/14/20 1200  BP:  (!) 147/83 107/61   Pulse:  65    Resp:      Temp: 98.4 F (36.9 C)  98.3 F (36.8 C) 98.1 F (36.7 C)  TempSrc: Oral  Oral Oral  SpO2:  95%      Intake/Output Summary (Last 24 hours) at 12/14/2020 1629 Last data filed at 12/14/2020 0000 Gross per 24 hour  Intake 152.19 ml  Output --  Net 152.19 ml    There were no vitals filed for this visit.  Examination: Physical Exam:  Constitutional: WN/WD African-American male who is appears uncomfortable and frustrated and agitated  Eyes: Lids and conjunctivae normal, sclerae anicteric  ENMT: External Ears, Nose appear normal. Grossly normal hearing.  Neck: Appears normal, supple, no cervical masses, normal ROM, no appreciable thyromegaly, no JVD Respiratory: Diminished to auscultation bilaterally, no wheezing, rales, rhonchi or crackles. Normal respiratory effort and patient is not tachypenic. No accessory muscle use.  Cardiovascular: RRR, no murmurs / rubs / gallops. S1 and S2 auscultated.  No appreciable extremity edema Abdomen: Soft, non-tender, non-distended. Bowel sounds positive.  GU: Deferred. Musculoskeletal: No clubbing / cyanosis of digits/nails. No joint deformity upper and lower extremities.  Skin: No rashes, lesions, ulcers on limited skin evaluation. No induration; Warm and dry.  Neurologic: CN 2-12 grossly intact with no focal deficits. Romberg sign and cerebellar reflexes not assessed.  Psychiatric: Normal judgment and insight. Alert and oriented x 3.  Frustrated mood and appropriate affect.   Data Reviewed: I have personally reviewed following labs and imaging studies  CBC: Recent Labs  Lab 12/11/20 1900 12/12/20 0212 12/13/20 0316 12/14/20 0350  WBC  6.9 8.6 8.3 8.1  NEUTROABS 5.5 7.8* 4.7 6.0  HGB 13.4 13.9 13.5 13.8  HCT 41.9 42.7 41.3 43.3  MCV 95.4 93.4 94.3 95.2  PLT 203 215 207 884    Basic Metabolic Panel: Recent Labs  Lab 12/11/20 1900 12/12/20 0212 12/13/20 0316 12/14/20 0350  NA 141 140 140 140  K 3.9 3.6 3.9 3.9  CL 108 109 107 108  CO2 24 20* 25 23  GLUCOSE 125* 146* 100* 136*  BUN 10 9 17 12   CREATININE 1.31* 1.17 1.50* 1.39*  CALCIUM 10.2 9.9 10.2 9.8  MG  --   --   --  2.5*  PHOS  --   --   --  2.7    GFR: CrCl cannot be calculated (Unknown ideal weight.). Liver Function Tests: Recent Labs  Lab 12/11/20 1900 12/14/20 0350  AST 16 26  ALT 15 20  ALKPHOS 76 62  BILITOT 1.0 1.1  PROT 8.4* 7.5  ALBUMIN 4.5 4.1    No results for input(s): LIPASE, AMYLASE in the last 168 hours. No results for input(s): AMMONIA in the last 168 hours. Coagulation Profile: Recent Labs  Lab 12/11/20 1900  INR 0.9    Cardiac Enzymes: No results for input(s): CKTOTAL, CKMB, CKMBINDEX, TROPONINI in the last 168 hours. BNP (last 3 results) No results for input(s): PROBNP in the last 8760 hours.  HbA1C: Recent Labs    12/12/20 0212  HGBA1C 5.9*    CBG: Recent Labs  Lab 12/13/20 2039 12/14/20 0140 12/14/20 0441 12/14/20 0737 12/14/20 1125  GLUCAP 104* 147* 134* 143* 135*    Lipid Profile: Recent Labs    12/12/20 0212  CHOL 268*  HDL 88  LDLCALC 166*  TRIG 71  CHOLHDL 3.0    Thyroid Function Tests: No results for input(s): TSH, T4TOTAL, FREET4, T3FREE, THYROIDAB in the last 72 hours. Anemia Panel: No results for input(s): VITAMINB12, FOLATE, FERRITIN, TIBC, IRON, RETICCTPCT in the last 72 hours. Sepsis Labs: No results for input(s): PROCALCITON, LATICACIDVEN in the last 168 hours.  Recent Results (from the past 240 hour(s))  Resp Panel by RT-PCR (Flu A&B, Covid) Nasopharyngeal Swab     Status: None   Collection Time: 12/11/20  7:46 PM   Specimen: Nasopharyngeal Swab; Nasopharyngeal(NP)  swabs in vial transport medium  Result Value Ref Range Status   SARS Coronavirus 2 by RT PCR NEGATIVE NEGATIVE Final    Comment: (NOTE) SARS-CoV-2 target nucleic acids are NOT DETECTED.  The SARS-CoV-2 RNA is generally detectable in upper respiratory specimens during the acute phase of infection. The lowest concentration of SARS-CoV-2 viral copies this assay can detect is 138 copies/mL. A negative result does not preclude SARS-Cov-2 infection and should not be used as the sole basis for treatment or other patient management decisions. A negative result may occur with  improper specimen collection/handling, submission of specimen other than nasopharyngeal swab, presence of viral mutation(s) within the areas targeted by this assay, and inadequate number of viral copies(<138 copies/mL). A negative result must be combined with clinical observations, patient history, and epidemiological information. The expected result is Negative.  Fact Sheet for Patients:  EntrepreneurPulse.com.au  Fact Sheet for Healthcare Providers:  IncredibleEmployment.be  This test is no t yet approved or cleared by the [place] FDA and  has been authorized for detection and/or diagnosis of SARS-CoV-2 by FDA under an Emergency Use Authorization (EUA). This EUA will remain  in effect (meaning this test can be used) for the duration of the COVID-19 declaration under Section 564(b)(1) of the Act, 21 U.S.C.section 360bbb-3(b)(1), unless the authorization is terminated  or revoked sooner.       Influenza A by PCR NEGATIVE NEGATIVE Final   Influenza B by PCR NEGATIVE NEGATIVE Final    Comment: (NOTE) The Xpert Xpress SARS-CoV-2/FLU/RSV plus assay is intended as an aid in the diagnosis of influenza from Nasopharyngeal swab specimens and should not be used as a sole basis for treatment. Nasal washings and aspirates are unacceptable for Xpert Xpress  SARS-CoV-2/FLU/RSV testing.  Fact Sheet for Patients: EntrepreneurPulse.com.au  Fact Sheet for Healthcare Providers: IncredibleEmployment.be  This test is not yet approved or cleared by the [place] FDA and has been authorized for detection and/or diagnosis of SARS-CoV-2 by FDA under an Emergency Use Authorization (EUA). This EUA will remain in effect (meaning this test can be used) for the duration of the COVID-19 declaration under Section 564(b)(1) of the Act, 21 U.S.C. section 360bbb-3(b)(1), unless the authorization is terminated or revoked.  Performed at [facility], [place] [address], [place], [place] [address]   MRSA Next Gen by PCR, Nasal     Status: None   Collection Time: 12/11/20  9:36 PM   Specimen: Nasal Mucosa; Nasal Swab  Result Value Ref Range Status   MRSA by PCR Next Gen NOT DETECTED NOT DETECTED Final    Comment: (NOTE) The  GeneXpert MRSA Assay (FDA approved for NASAL specimens only), is one component of a comprehensive MRSA colonization surveillance program. It is not intended to diagnose MRSA infection nor to guide or monitor treatment for MRSA infections. Test performance is not FDA approved in patients less than 66 years old. Performed at [facility], [place] [address] [place], [place] [address]       RN Pressure Injury Documentation:     Estimated body mass index is 33.68 kg/m as calculated from the following:   Height as of 02/19/20: 5' 10.5" (1.791 m).   Weight as of 02/19/20: 108 kg.  Malnutrition Type:   Malnutrition Characteristics:   Nutrition Interventions:   Radiology Studies: CT HEAD WO CONTRAST  Result Date: 12/13/2020 CLINICAL DATA:  Worsening headache EXAM: CT HEAD WITHOUT CONTRAST TECHNIQUE: Contiguous axial images were obtained from the base of the skull through the vertex without intravenous contrast. COMPARISON:  12/11/2020 FINDINGS: Brain: Again seen is the right  basal ganglia intracerebral hemorrhage with intraventricular extension. No significant change since prior study. Stable mild communicating hydrocephalus. No significant midline shift. Vascular: No hyperdense vessel or unexpected calcification. Skull: No acute calvarial abnormality. 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 No change since prior study. Electronically Signed   By: Rolm Baptise M.D.   On: 12/13/2020 21:40   MR MRA HEAD WO CONTRAST  Result Date: 12/12/2020 CLINICAL DATA:  Intracranial hemorrhage. EXAM: MRI HEAD WITHOUT AND WITH CONTRAST MRA HEAD WITHOUT CONTRAST TECHNIQUE: Multiplanar, multi-echo pulse sequences of the brain and surrounding structures were acquired without and with intravenous contrast. Angiographic images of the Circle of Willis were acquired using MRA technique without intravenous contrast. CONTRAST:  17m GADAVIST GADOBUTROL 1 MMOL/ML IV SOLN COMPARISON:  Head CT 12/11/2020 FINDINGS: MRI HEAD FINDINGS Brain: Unchanged intraparenchymal hematoma of the right caudate head with intraventricular extension. 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 No acute infarct. There is multifocal hyperintense T2-weighted signal within the white matter. Generalized volume loss without a clear lobar predilection. There is no abnormal contrast enhancement. Vascular: Major flow voids are preserved. Skull and upper cervical spine: Normal calvarium and skull base. Visualized upper cervical spine and soft tissues are normal. Sinuses/Orbits:No paranasal sinus fluid levels or advanced mucosal thickening. No mastoid or middle ear effusion. Normal orbits. MRA HEAD FINDINGS POSTERIOR CIRCULATION: --Vertebral arteries: Normal --Inferior cerebellar arteries: Normal. --Basilar artery: Normal. --Superior cerebellar arteries: Normal. --Posterior cerebral arteries: Normal. Both are predominantly supplied by the posterior  communicating arteries (p-comm). ANTERIOR CIRCULATION: --Intracranial internal carotid arteries: Normal. --Anterior cerebral arteries (ACA): Normal. --Middle cerebral arteries (MCA): Normal. ANATOMIC VARIANTS: Fetal origins of both posterior cerebral arteries. IMPRESSION: 1. Unchanged intraparenchymal hematoma of the right caudate head with intraventricular extension and mild communicating hydrocephalus. 2. Normal intracranial MRA. Electronically Signed   By: KUlyses JarredM.D.   On: 12/12/2020 20:50   MR BRAIN W WO CONTRAST  Result Date: 12/12/2020 CLINICAL DATA:  Intracranial hemorrhage. EXAM: MRI HEAD WITHOUT AND WITH CONTRAST MRA HEAD WITHOUT CONTRAST TECHNIQUE: Multiplanar, multi-echo pulse sequences of the brain and surrounding structures were acquired without and with intravenous contrast. Angiographic images of the Circle of Willis were acquired using MRA technique without intravenous contrast. CONTRAST:  914mGADAVIST GADOBUTROL 1 MMOL/ML IV SOLN COMPARISON:  Head CT 12/11/2020 FINDINGS: MRI HEAD FINDINGS Brain: Unchanged intraparenchymal hematoma of the right caudate head with intraventricular extension. 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 No acute infarct. There is multifocal hyperintense T2-weighted signal within the  white matter. Generalized volume loss without a clear lobar predilection. There is no abnormal contrast enhancement. Vascular: Major flow voids are preserved. Skull and upper cervical spine: Normal calvarium and skull base. Visualized upper cervical spine and soft tissues are normal. Sinuses/Orbits:No paranasal sinus fluid levels or advanced mucosal thickening. No mastoid or middle ear effusion. Normal orbits. MRA HEAD FINDINGS POSTERIOR CIRCULATION: --Vertebral arteries: Normal --Inferior cerebellar arteries: Normal. --Basilar artery: Normal. --Superior cerebellar arteries: Normal. --Posterior cerebral arteries: Normal. Both are predominantly supplied by the posterior  communicating arteries (p-comm). ANTERIOR CIRCULATION: --Intracranial internal carotid arteries: Normal. --Anterior cerebral arteries (ACA): Normal. --Middle cerebral arteries (MCA): Normal. ANATOMIC VARIANTS: Fetal origins of both posterior cerebral arteries. IMPRESSION: 1. Unchanged intraparenchymal hematoma of the right caudate head with intraventricular extension and mild communicating hydrocephalus. 2. Normal intracranial MRA. Electronically Signed   By: Ulyses Jarred M.D.   On: 12/12/2020 20:50   VAS US CAROTID  Result Date: 12/13/2020 Carotid Arterial Duplex Study Patient Name:  Matthew Francis  Date of Exam:   12/13/2020 Medical Rec #: [id number]      Accession #:    [id number] Date of Birth: 1960/09/08      Patient Gender: M Patient Age:   59Y Exam [facility] Procedure:      VAS US CAROTID Referring Phys: [id number] Rosalin Hawking --------------------------------------------------------------------------------  Indications:       CVA. Risk Factors:      Hypertension, hyperlipidemia. Comparison Study:  no prior Performing Technologist: Archie Patten RVS  Examination Guidelines: A complete evaluation includes B-mode imaging, spectral Doppler, color Doppler, and power Doppler as needed of all accessible portions of each vessel. Bilateral testing is considered an integral part of a complete examination. Limited examinations for reoccurring indications may be performed as noted.  Right Carotid Findings: +----------+--------+--------+--------+------------------+--------+           PSV cm/sEDV cm/sStenosisPlaque DescriptionComments +----------+--------+--------+--------+------------------+--------+ CCA Prox  77      9               heterogenous               +----------+--------+--------+--------+------------------+--------+ CCA Distal65      14              heterogenous               +----------+--------+--------+--------+------------------+--------+ ICA Prox  39      13       1-39%   heterogenous               +----------+--------+--------+--------+------------------+--------+ ICA Distal37      14                                         +----------+--------+--------+--------+------------------+--------+ ECA       69      11                                         +----------+--------+--------+--------+------------------+--------+ +----------+--------+-------+--------+-------------------+           PSV cm/sEDV cmsDescribeArm Pressure (mmHG) +----------+--------+-------+--------+-------------------+ [id number]                                         +----------+--------+-------+--------+-------------------+ +---------+--------+--+--------+-+---------+  VertebralPSV cm/s31EDV cm/s9Antegrade +---------+--------+--+--------+-+---------+  Left Carotid Findings: +----------+--------+--------+--------+------------------+--------+           PSV cm/sEDV cm/sStenosisPlaque DescriptionComments +----------+--------+--------+--------+------------------+--------+ CCA Prox  118     22              heterogenous               +----------+--------+--------+--------+------------------+--------+ CCA Distal63      21              heterogenous               +----------+--------+--------+--------+------------------+--------+ ICA Prox  53      22      1-39%   heterogenous               +----------+--------+--------+--------+------------------+--------+ ICA Distal65      26                                         +----------+--------+--------+--------+------------------+--------+ ECA       73      18                                         +----------+--------+--------+--------+------------------+--------+ +----------+--------+--------+--------+-------------------+           PSV cm/sEDV cm/sDescribeArm Pressure (mmHG) +----------+--------+--------+--------+-------------------+ [id number]                                           +----------+--------+--------+--------+-------------------+ +---------+--------+--+--------+-+---------+ VertebralPSV cm/s32EDV cm/s9Antegrade +---------+--------+--+--------+-+---------+   Summary: Right Carotid: Velocities in the right ICA are consistent with a 1-39% stenosis. Left Carotid: Velocities in the left ICA are consistent with a 1-39% stenosis. Vertebrals: Bilateral vertebral arteries demonstrate antegrade flow. *See table(s) above for measurements and observations.     Preliminary     Scheduled Meds:  amLODipine  10 mg Oral Daily   Chlorhexidine Gluconate Cloth  6 each Topical Daily   ezetimibe  10 mg Oral Daily   hydrALAZINE  100 mg Oral BID   insulin aspart  0-9 Units Subcutaneous TID AC & HS   multivitamin with minerals  1 tablet Oral Daily   pantoprazole  40 mg Oral Daily   psyllium  1 packet Oral Daily   rifaximin  550 mg Oral TID   rosuvastatin  20 mg Oral Daily   vitamin B-12  100 mcg Oral Daily   Vitamin D (Ergocalciferol)  50,000 Units Oral Q Wed   Continuous Infusions:   LOS: 3 days   Kerney Elbe, DO [facility] PAGER is on AMION  If 7PM-7AM, please contact night-coverage [email or web address]

## 2020-12-14 NOTE — Procedures (Addendum)
Patient Name: Matthew Francis  MRN: [id number]  Epilepsy Attending: Lora Havens  Referring Physician/Provider: Dr Consuella Lose Date: 12/14/2020 Duration: 24.46 mins  Patient history: 60 y.o. male with basal ganglia hemorrhage with intraventricular extension who has been neurologically well, with what appears to be episodic confusion and somnolence. EEG to evaluate for seizure   Level of alertness: Awake   AEDs during EEG study: None  Technical aspects: This EEG study was done with scalp electrodes positioned according to the 10-20 International system of electrode placement. Electrical activity was acquired at a sampling rate of 500Hz  and reviewed with a high frequency filter of 70Hz  and a low frequency filter of 1Hz . EEG data were recorded continuously and digitally stored.   Description: The posterior dominant rhythm consists of 8 Hz activity of moderate voltage (25-35 uV) seen predominantly in posterior head regions, symmetric and reactive to eye opening and eye closing. EEG showed continuous 3 to 6 Hz theta-delta slowing in right hemisphere, maximal temporal region. Hyperventilation and photic stimulation were not performed.     ABNORMALITY - Continuous slow, right hemisphere, maximal temporal region  IMPRESSION: This study is suggestive of cortical dysfunction arising from right hemisphere, maximal right temporal region, likely secondary to underlying structural abnormality/bleed. No seizures or epileptiform discharges were seen throughout the recording.  [name]

## 2020-12-14 NOTE — Evaluation (Addendum)
SLP Cancellation Note  Patient Details Name: SIM CHOQUETTE MRN: [id number] DOB: 1960-09-12   Cancelled treatment:       Reason Eval/Treat Not Completed: Other (comment) (pt currently having severe pain and states he can not concentrate. Will continue efforts)  If pt to dc before cog evaluation can be completed, SLP eval may be ordered at next venue of care.   Informed RN, Jerene Pitch.  Thanks.   Kathleen Lime, MS [facility] SLP Acute Rehab Services Office [phone] Pager [phone]  [name] 12/14/2020, 10:25 AM

## 2020-12-15 ENCOUNTER — Inpatient Hospital Stay (HOSPITAL_COMMUNITY): Payer: 59

## 2020-12-15 DIAGNOSIS — I610 Nontraumatic intracerebral hemorrhage in hemisphere, subcortical: Secondary | ICD-10-CM

## 2020-12-15 DIAGNOSIS — R001 Bradycardia, unspecified: Secondary | ICD-10-CM

## 2020-12-15 DIAGNOSIS — R569 Unspecified convulsions: Secondary | ICD-10-CM

## 2020-12-15 DIAGNOSIS — I629 Nontraumatic intracranial hemorrhage, unspecified: Secondary | ICD-10-CM

## 2020-12-15 LAB — GLUCOSE, CAPILLARY
Glucose-Capillary: 118 mg/dL — ABNORMAL HIGH (ref 70–99)
Glucose-Capillary: 124 mg/dL — ABNORMAL HIGH (ref 70–99)
Glucose-Capillary: 126 mg/dL — ABNORMAL HIGH (ref 70–99)

## 2020-12-15 LAB — CBC WITH DIFFERENTIAL/PLATELET
Abs Immature Granulocytes: 0.04 10*3/uL (ref 0.00–0.07)
Basophils Absolute: 0 10*3/uL (ref 0.0–0.1)
Basophils Relative: 0 %
Eosinophils Absolute: 0 10*3/uL (ref 0.0–0.5)
Eosinophils Relative: 0 %
HCT: 42.5 % (ref 39.0–52.0)
Hemoglobin: 13.7 g/dL (ref 13.0–17.0)
Immature Granulocytes: 0 %
Lymphocytes Relative: 12 %
Lymphs Abs: 1.3 10*3/uL (ref 0.7–4.0)
MCH: 30.2 pg (ref 26.0–34.0)
MCHC: 32.2 g/dL (ref 30.0–36.0)
MCV: 93.6 fL (ref 80.0–100.0)
Monocytes Absolute: 1.4 10*3/uL — ABNORMAL HIGH (ref 0.1–1.0)
Monocytes Relative: 13 %
Neutro Abs: 7.8 10*3/uL — ABNORMAL HIGH (ref 1.7–7.7)
Neutrophils Relative %: 75 %
Platelets: 195 10*3/uL (ref 150–400)
RBC: 4.54 MIL/uL (ref 4.22–5.81)
RDW: 14.6 % (ref 11.5–15.5)
WBC: 10.6 10*3/uL — ABNORMAL HIGH (ref 4.0–10.5)
nRBC: 0 % (ref 0.0–0.2)

## 2020-12-15 LAB — BASIC METABOLIC PANEL
Anion gap: 11 (ref 5–15)
BUN: 8 mg/dL (ref 6–20)
CO2: 21 mmol/L — ABNORMAL LOW (ref 22–32)
Calcium: 9.6 mg/dL (ref 8.9–10.3)
Chloride: 103 mmol/L (ref 98–111)
Creatinine, Ser: 1.05 mg/dL (ref 0.61–1.24)
GFR, Estimated: >60 mL/min (ref >60)
Glucose, Bld: 120 mg/dL — ABNORMAL HIGH (ref 70–99)
Potassium: 4 mmol/L (ref 3.5–5.1)
Sodium: 135 mmol/L (ref 135–145)

## 2020-12-15 LAB — TSH: TSH: 0.222 u[IU]/mL — ABNORMAL LOW (ref 0.350–4.500)

## 2020-12-15 LAB — TROPONIN I (HIGH SENSITIVITY): Troponin I (High Sensitivity): 5 ng/L (ref <18)

## 2020-12-15 MED ORDER — FENTANYL CITRATE (PF) 100 MCG/2ML IJ SOLN
INTRAMUSCULAR | Status: AC
  Filled 2020-12-15: qty 2

## 2020-12-15 MED ORDER — GABAPENTIN 300 MG PO CAPS
300.0000 mg | ORAL_CAPSULE | Freq: Three times a day (TID) | ORAL | Status: DC
  Administered 2020-12-15: 300 mg via ORAL
  Filled 2020-12-15: qty 1

## 2020-12-15 MED ORDER — BACLOFEN 5 MG HALF TABLET
5.0000 mg | ORAL_TABLET | Freq: Three times a day (TID) | ORAL | Status: DC
  Administered 2020-12-15 – 2020-12-16 (×3): 5 mg via ORAL
  Filled 2020-12-15 (×4): qty 1

## 2020-12-15 MED ORDER — FENTANYL CITRATE (PF) 100 MCG/2ML IJ SOLN
75.0000 ug | Freq: Four times a day (QID) | INTRAMUSCULAR | Status: DC | PRN
  Administered 2020-12-15 – 2020-12-16 (×4): 75 ug via INTRAVENOUS
  Filled 2020-12-15 (×4): qty 2

## 2020-12-15 MED ORDER — ATROPINE SULFATE 1 MG/10ML IJ SOSY: 1.0000 mg | PREFILLED_SYRINGE | INTRAMUSCULAR | Status: DC | PRN

## 2020-12-15 MED ORDER — BUDESONIDE 3 MG PO CPEP
9.0000 mg | ORAL_CAPSULE | Freq: Every day | ORAL | Status: DC
  Administered 2020-12-15 – 2020-12-21 (×7): 9 mg via ORAL
  Filled 2020-12-15 (×7): qty 3

## 2020-12-15 NOTE — Progress Notes (Signed)
Pt less restless since addition of fentanyl. He is using call bell consistently. Waist belt d/c'd.

## 2020-12-15 NOTE — Consult Note (Signed)
NAME:  Matthew Francis, MRN:  [id number], DOB:  1960/07/26, LOS: 4 ADMISSION DATE:  12/11/2020, CONSULTATION DATE:  12/15/20  REFERRING MD:  Rory Percy , CHIEF COMPLAINT:  bradycardia, headache initially   History of Present Illness:  Matthew Francis is a 60 year old man with a history of HTN, HLD, Arthritis, GERD, headaches, here with hemorrhagic stroke.  He initially presented on 6/28 with HA, severely elevated BP after not taking BP meds, found to have IPH and started on nicardipine.  Admitted to neurology service.  IPH: right caudate head ICH with IVH likely secondary to hypertensive emergency and small vessel disease in the setting of cocaine abuse  Increased  N/v and AMS,  and repeat CT head showed hydrocephalus 7/1.  Since waxing and waning NSG thought not c/w clinically significant hydrocephalus.    7/2: pain in legs and agitation.  Had been on oxycodone, started fentanyl 7/2 and baclofen.   Temperature increasing (last measured 100.5) Got fentanyl 22mg x 3 today  Started baclofen, received x 1.  Also getting fioricet (twice today)  \Complaining of back spasm, says baclofen was not helfpul.    Critical care consult called due to episodes of bradycardia.  Pertinent  Medical History  GERD Arthritis UC  HLD  HTN Inguinal hernia UTI 2/2 ESBL  CKD  stage 3A Hx AKI  (AKI improving) DM?  Tylenol, amlodipine, vit C, Atenolol, budesonide, zyrtec, bentyl, nexium, gabapentin, hydralazine, loperamide, MVI, zofran, phenergan, fiber, B 12 vit D, xifaxan Cocaine use hx  Hx pAfib 8/21  Allergy to "contrast", PCN, sulfa  Significant Hospital Events: Including procedures, antibiotic start and stop dates in addition to other pertinent events     Interim History / Subjective:    Objective   Blood pressure (!) 145/129, pulse 70, temperature (!) 100.5 F (38.1 C), temperature source Axillary, resp. rate 17, height 5' 11"  (1.803 m), weight 89.2 kg, SpO2 96 %.        Intake/Output Summary (Last  24 hours) at 12/15/2020 1917 Last data filed at 12/15/2020 0700 Gross per 24 hour  Intake 373.88 ml  Output 625 ml  Net -251.12 ml   Filed Weights   12/15/20 1400  Weight: 89.2 kg    Examination: General: pleasant, appears intermittently uncomfortable, normal WOB.  HENT: ncat Lungs: ctab Cardiovascular: no mgr  Abdomen: midline inc scar, nt, nd, nbs  Extremities: no edema, no tenderness  Neuro: alert and oriented  GU:   Tele: episodes of NSR, interspersed with episodes of apparent junctional rhythm (no P waves hr in 40s), several episodes of mild brady (in 538s with p wave of different morphology.  EKG from this afternoon suggestive of possible atrial fibrillation with slow ventricular rate, vs Wandering atrial pacemaker.   Resolved Hospital Problem list     Assessment & Plan:  Bradycardia: having episodes of junctional rhythm, which are brief.  Also possible episodes of wandering atrial pacemaker.  Appears to be having some sinus node dysfunction at the very least, possible intermittent episodes of afib with slow ventricular rate.   Will check ekg, trop, chemistry panel, CXR now.  Echo recently done was essentially normal.   Has not received any beta blockers on my review.  D/c prn labetalol.   Fentanyl could be contributing?  Has been receiving fioricet with caffeine, so could be contributing to possible afib, but seems doubtful.   Not hemodynamically significant but agree cont to monitor in ICU.   Atropine to bedside in case becomes unstable.  Fever: one episode of 100.5 this evening.  Now afebrile. Has been receiving tylenol for pain frequently, wbc slightly elevated this am compared to prior.  Check blood cultures and urine, cxr.   IPH/ventricular hemorrhage, hydrocephalus: mental status and CT from this am appear stable with improved hydrocephalus.    HTN: on amlodipine, hctz.   HLD: on crestor.   AKI: improving.   Headache and back spasm Best Practice (right click  and "Reselect all SmartList Selections" daily)   Diet/type: Regular consistency (see orders) DVT prophylaxis: not indicated GI prophylaxis: N/A Lines: N/A Foley:  N/A Code Status:  full code Last date of multidisciplinary goals of care discussion []   Labs   CBC: Recent Labs  Lab 12/11/20 1900 12/12/20 0212 12/13/20 0316 12/14/20 0350 12/15/20 0033  WBC 6.9 8.6 8.3 8.1 10.6*  NEUTROABS 5.5 7.8* 4.7 6.0 7.8*  HGB 13.4 13.9 13.5 13.8 13.7  HCT 41.9 42.7 41.3 43.3 42.5  MCV 95.4 93.4 94.3 95.2 93.6  PLT 203 215 207 212 867    Basic Metabolic Panel: Recent Labs  Lab 12/11/20 1900 12/12/20 0212 12/13/20 0316 12/14/20 0350 12/15/20 0033  NA 141 140 140 140 135  K 3.9 3.6 3.9 3.9 4.0  CL 108 109 107 108 103  CO2 24 20* 25 23 21*  GLUCOSE 125* 146* 100* 136* 120*  BUN 10 9 17 12 8   CREATININE 1.31* 1.17 1.50* 1.39* 1.05  CALCIUM 10.2 9.9 10.2 9.8 9.6  MG  --   --   --  2.5*  --   PHOS  --   --   --  2.7  --    GFR: Estimated Creatinine Clearance: 82.7 mL/min (by C-G formula based on SCr of 1.05 mg/dL). Recent Labs  Lab 12/12/20 0212 12/13/20 0316 12/14/20 0350 12/15/20 0033  WBC 8.6 8.3 8.1 10.6*    Liver Function Tests: Recent Labs  Lab 12/11/20 1900 12/14/20 0350  AST 16 26  ALT 15 20  ALKPHOS 76 62  BILITOT 1.0 1.1  PROT 8.4* 7.5  ALBUMIN 4.5 4.1   No results for input(s): LIPASE, AMYLASE in the last 168 hours. No results for input(s): AMMONIA in the last 168 hours.  ABG No results found for: PHART, PCO2ART, PO2ART, HCO3, TCO2, ACIDBASEDEF, O2SAT   Coagulation Profile: Recent Labs  Lab 12/11/20 1900  INR 0.9    Cardiac Enzymes: No results for input(s): CKTOTAL, CKMB, CKMBINDEX, TROPONINI in the last 168 hours.  HbA1C: Hgb A1c MFr Bld  Date/Time Value Ref Range Status  12/12/2020 02:12 AM 5.9 (H) 4.8 - 5.6 % Final    Comment:    (NOTE)         Prediabetes: 5.7 - 6.4         Diabetes: >6.4         Glycemic control for adults with  diabetes: <7.0     CBG: Recent Labs  Lab 12/14/20 0737 12/14/20 1125 12/14/20 2141 12/15/20 1115 12/15/20 1713  GLUCAP 143* 135* 126* 118* 124*   EEG 7/1: This study is suggestive of cortical dysfunction arising from right hemisphere, maximal right temporal region, likely secondary to underlying structural abnormality/bleed. No seizures or epileptiform discharges were seen throughout the recording.  EEG 7/2: IMPRESSION: This study is suggestive of cortical dysfunction arising from right hemisphere, maximal right temporal region, likely secondary to underlying structural abnormality/bleed. No seizures or epileptiform discharges were seen throughout the recording.  EKG: sinus   MRI 6/29:  IMPRESSION: 1. Unchanged intraparenchymal hematoma  of the right caudate head with intraventricular extension and mild communicating hydrocephalus. 2. Normal intracranial MRA.  12/15/20:  IMPRESSION: 1. Improved hydrocephalus. 2. Persistent intraventricular extension of hemorrhage from the right caudate head hematoma.  7/1 ct head IMPRESSION: Persistent hemorrhage in the head of the caudate nucleus on the right with evidence of interventricular thrombus in the lateral ventricles bilaterally and third ventricle. There is interval dilatation of the lateral and third ventricles with findings of thrombus and apparent occlusion of the cerebral aqueduct related to change in the morphology of the third ventricular thrombus. Additionally signs of increased intracranial pressure are noted with decreased sulcal markings when compared with the previous day.  6/30 CT   IMPRESSION: Stable right basal ganglia hemorrhage with intraventricular extension. No change since prior study.   6/28 CT IMPRESSION: 1. Acute hemorrhage within the right caudate/basal ganglia with hemorrhage in the right lateral, third, and fourth ventricles. No midline shift. The ventricles are slightly dilated as compared  with prior CT from 2021. 2. Mild chronic small vessel ischemic change of the white matter. Review of Systems:   Review of Systems  Constitutional:  Negative for chills and weight loss.  HENT:  Negative for hearing loss.   Eyes:  Negative for blurred vision.  Respiratory:  Negative for cough and hemoptysis.   Cardiovascular:  Negative for chest pain and palpitations.  Gastrointestinal:  Negative for heartburn.  Genitourinary:  Negative for dysuria.  Musculoskeletal:  Negative for myalgias.  Skin:  Negative for rash.  Neurological:  Negative for dizziness.  Endo/Heme/Allergies:  Does not bruise/bleed easily.  Psychiatric/Behavioral:  Negative for depression.     Past Medical History:  He,  has a past medical history of Arthritis, GERD (gastroesophageal reflux disease), H/O ulcerative colitis, Hyperlipidemia, Hypertension, Inguinal hernia, Ulcer, Ulcerative colitis, and Wears glasses.   Surgical History:   Past Surgical History:  Procedure Laterality Date   APPENDECTOMY     COLECTOMY     Proctocolectomy with IPAA   COLECTOMY     FOREIGN BODY REMOVAL ABDOMINAL  02/17/2012   Procedure: REMOVAL FOREIGN BODY ABDOMINAL;  Surgeon: Odis Hollingshead, MD;  Location: [facility];  Service: General;  Laterality: N/A;  abdominal wound exploration and removal of foreign body   HERNIA REPAIR     Multiple incisional hernias.   ileoanal anastomosis       Social History:   reports that he has never smoked. He has never used smokeless tobacco. He reports current alcohol use of about 4.0 standard drinks of alcohol per week. He reports that he does not use drugs.   Family History:  His family history includes Cancer in his sister.   Allergies Allergies  Allergen Reactions   Asacol [Mesalamine] Swelling    Throat    Penicillins Anaphylaxis    Has patient had a PCN reaction causing immediate rash, facial/tongue/throat swelling, SOB or lightheadedness with hypotension: Yes Has  patient had a PCN reaction causing severe rash involving mucus membranes or skin necrosis: No Has patient had a PCN reaction that required hospitalization: Yes Has patient had a PCN reaction occurring within the last 10 years: No If all of the above answers are "NO", then may proceed with Cephalosporin use.   Sulfa Antibiotics Anaphylaxis   Atorvastatin Other (See Comments)    Myalgia, muscle cramps   Bee Pollen Hives   Bee Venom Hives and Swelling   Iodinated Diagnostic Agents     Pt reports allergy at [facility]. Rapid heartbeat and  dyspnea     Home Medications  Prior to Admission medications   Medication Sig Start Date End Date Taking? Authorizing Provider  acetaminophen (TYLENOL) 500 MG tablet Take 500 mg by mouth every 6 (six) hours as needed for mild pain.    Yes [provider]  amLODipine (NORVASC) 10 MG tablet Take 10 mg by mouth daily.  02/19/18  Yes [provider]  ascorbic acid (VITAMIN C) 500 MG tablet Take 500 mg by mouth daily.   Yes [provider]  atenolol (TENORMIN) 25 MG tablet Take 25 mg by mouth daily.    Yes [provider]  budesonide (ENTOCORT EC) 3 MG 24 hr capsule Take 9 mg by mouth daily. 02/15/20  Yes [provider]  cetirizine (ZYRTEC) 10 MG tablet Take 10 mg by mouth daily as needed for allergies.    Yes [provider]  Cholecalciferol (VITAMIN D3) 50000 units CAPS Take 50,000 Units by mouth every Wednesday.  03/05/18  Yes [provider]  dicyclomine (BENTYL) 10 MG capsule Take 10 mg by mouth every 6 (six) hours as needed for spasms. 10/07/20  Yes [provider]  esomeprazole (NEXIUM) 20 MG capsule Take 20 mg by mouth daily.   Yes [provider]  ezetimibe (ZETIA) 10 MG tablet Take 10 mg by mouth daily. 01/06/20  Yes [provider]  gabapentin (NEURONTIN) 300 MG capsule Take 300 mg by mouth 3 (three) times daily as needed (nerve pain).  11/28/19  Yes [provider]  Glucosamine HCl (GLUCOSAMINE PO) Take 1 tablet by mouth daily.   Yes [provider]  hydrALAZINE (APRESOLINE) 25 MG tablet Take 25 mg by mouth 3 (three) times daily. 10/07/20  Yes [provider]  hydrALAZINE (APRESOLINE) 50 MG tablet Take 50 mg by mouth in the morning and at bedtime.  10/10/19  Yes [provider]  loperamide (IMODIUM A-D) 2 MG tablet Take 2 mg by mouth 2 (two) times daily. (Do not exceed more than 4 tablets in 24 hours)   Yes [provider]  Multiple Vitamin (MULTIVITAMIN WITH MINERALS) TABS Take 1 tablet by mouth daily.   Yes [provider]  Omega-3 Fatty Acids (FISH OIL) 1000 MG CAPS Take 1 capsule by mouth daily.    Yes [provider]  psyllium (METAMUCIL) 58.6 % packet Take 1 packet by mouth daily as needed (For fiber/thickener).    Yes [provider]  rosuvastatin (CRESTOR) 10 MG tablet Take 10 mg by mouth every other day.  12/08/19  Yes [provider]  Turmeric (QC TUMERIC COMPLEX PO) Take 1 tablet by mouth daily as needed (For inflammatory).   Yes [provider]  vitamin B-12 (CYANOCOBALAMIN) 100 MCG tablet Take 100 mcg by mouth daily.   Yes [provider]  XIFAXAN 550 MG TABS tablet Take 550 mg by mouth 3 (three) times daily. 10/04/20  Yes [provider]  VICTOZA 18 MG/3ML SOPN Inject 1.8 mg into the skin daily. Patient not taking: No sig reported 09/18/20   [provider]     Critical care time: 60 min

## 2020-12-15 NOTE — Progress Notes (Addendum)
STROKE TEAM PROGRESS NOTE   SUBJECTIVE (INTERVAL HISTORY) No family is at the bedside. Overnight had projectile vomiting, more confused, combative. STAT CT showed developing hydrocephalus and neurosurgery evaluated and did not feel EVD was necessary at the time.   This morning complaining of severe lower back pain/spasms radiating into b/l LEs, will give fentanyl PRN for now and start baclofen. He had opioid abuse in the past, but short term opioids use at this time is reasonable.   OBJECTIVE Temp:  [98.1 F (36.7 C)-99.1 F (37.3 C)] 98.3 F (36.8 C) (07/02 0800) Pulse Rate:  [51-94] 66 (07/02 0915) Resp:  [13-33] 16 (07/02 0915) BP: (124-158)/(72-110) 150/93 (07/02 0915) SpO2:  [89 %-98 %] 96 % (07/02 0915)  Recent Labs  Lab 12/14/20 0140 12/14/20 0441 12/14/20 0737 12/14/20 1125 12/14/20 2141  GLUCAP 147* 134* 143* 135* 126*   Recent Labs  Lab 12/11/20 1900 12/12/20 0212 12/13/20 0316 12/14/20 0350 12/15/20 0033  NA 141 140 140 140 135  K 3.9 3.6 3.9 3.9 4.0  CL 108 109 107 108 103  CO2 24 20* 25 23 21*  GLUCOSE 125* 146* 100* 136* 120*  BUN 10 9 17 12 8   CREATININE 1.31* 1.17 1.50* 1.39* 1.05  CALCIUM 10.2 9.9 10.2 9.8 9.6  MG  --   --   --  2.5*  --   PHOS  --   --   --  2.7  --    Recent Labs  Lab 12/11/20 1900 12/14/20 0350  AST 16 26  ALT 15 20  ALKPHOS 76 62  BILITOT 1.0 1.1  PROT 8.4* 7.5  ALBUMIN 4.5 4.1   Recent Labs  Lab 12/11/20 1900 12/12/20 0212 12/13/20 0316 12/14/20 0350 12/15/20 0033  WBC 6.9 8.6 8.3 8.1 10.6*  NEUTROABS 5.5 7.8* 4.7 6.0 7.8*  HGB 13.4 13.9 13.5 13.8 13.7  HCT 41.9 42.7 41.3 43.3 42.5  MCV 95.4 93.4 94.3 95.2 93.6  PLT 203 215 207 212 195   No results for input(s): CKTOTAL, CKMB, CKMBINDEX, TROPONINI in the last 168 hours. No results for input(s): LABPROT, INR in the last 72 hours.  No results for input(s): COLORURINE, LABSPEC, [place], GLUCOSEU, HGBUR, BILIRUBINUR, KETONESUR, PROTEINUR, UROBILINOGEN, NITRITE,  LEUKOCYTESUR in the last 72 hours.  Invalid input(s): APPERANCEUR      Component Value Date/Time   CHOL 268 (H) 12/12/2020 0212   TRIG 71 12/12/2020 0212   HDL 88 12/12/2020 0212   CHOLHDL 3.0 12/12/2020 0212   VLDL 14 12/12/2020 0212   LDLCALC 166 (H) 12/12/2020 0212   Lab Results  Component Value Date   HGBA1C 5.9 (H) 12/12/2020      Component Value Date/Time   LABOPIA NONE DETECTED 12/12/2020 0324   COCAINSCRNUR POSITIVE (A) 12/12/2020 0324   LABBENZ NONE DETECTED 12/12/2020 0324   AMPHETMU NONE DETECTED 12/12/2020 0324   THCU NONE DETECTED 12/12/2020 0324   LABBARB NONE DETECTED 12/12/2020 0324    No results for input(s): ETH in the last 168 hours.  I have personally reviewed the radiological images below and agree with the radiology interpretations.  CT HEAD WO CONTRAST  Result Date: 12/15/2020 CLINICAL DATA:  Intracranial hemorrhage follow-up EXAM: CT HEAD WITHOUT CONTRAST TECHNIQUE: Contiguous axial images were obtained from the base of the skull through the vertex without intravenous contrast. COMPARISON:  12/14/2020 FINDINGS: Brain: Persistent intraventricular extension of hemorrhage from the right caudate head hematoma. Hydrocephalus has improved. No new site of hemorrhage. Vascular: No abnormal hyperdensity of the major  intracranial arteries or dural venous sinuses. No intracranial atherosclerosis. Skull: The visualized skull base, calvarium and extracranial soft tissues are normal. Sinuses/Orbits: No fluid levels or advanced mucosal thickening of the visualized paranasal sinuses. No mastoid or middle ear effusion. The orbits are normal. IMPRESSION: 1. Improved hydrocephalus. 2. Persistent intraventricular extension of hemorrhage from the right caudate head hematoma. Electronically Signed   By: Ulyses Jarred M.D.   On: 12/15/2020 02:22   CT HEAD WO CONTRAST  Result Date: 12/14/2020 CLINICAL DATA:  Follow-up intracranial hemorrhage EXAM: CT HEAD WITHOUT CONTRAST TECHNIQUE:  Contiguous axial images were obtained from the base of the skull through the vertex without intravenous contrast. COMPARISON:  12/13/2020 FINDINGS: Brain: There again noted changes consistent with hemorrhage in the head of the caudate nucleus on the right. Intraventricular extension into the right lateral ventricle is noted. The thrombus is stable although there are changes of progressive dilatation in the lateral and third ventricles. Fourth ventricle appears within normal limits. No new hemorrhage is seen. Findings suggestive of increasing intracranial pressure are noted with decrease in the sulcal markings when compared with the previous day. Thrombus is noted within the third ventricle which appears to extend into cerebral aqueduct causing the dilatation. Vascular: No hyperdense vessel or unexpected calcification. Skull: Normal. Negative for fracture or focal lesion. Sinuses/Orbits: No acute finding. Other: None. IMPRESSION: Persistent hemorrhage in the head of the caudate nucleus on the right with evidence of interventricular thrombus in the lateral ventricles bilaterally and third ventricle. There is interval dilatation of the lateral and third ventricles with findings of thrombus and apparent occlusion of the cerebral aqueduct related to change in the morphology of the third ventricular thrombus. Additionally signs of increased intracranial pressure are noted with decreased sulcal markings when compared with the previous day. Critical Value/emergent results were called by telephone at the time of interpretation on 12/14/2020 at 4:42 pm to Dr. Rosalin Hawking , who verbally acknowledged these results. Electronically Signed   By: [name] M.D.   On: 12/14/2020 16:43   CT HEAD WO CONTRAST  Result Date: 12/13/2020 CLINICAL DATA:  Worsening headache EXAM: CT HEAD WITHOUT CONTRAST TECHNIQUE: Contiguous axial images were obtained from the base of the skull through the vertex without intravenous contrast.  COMPARISON:  12/11/2020 FINDINGS: Brain: Again seen is the right basal ganglia intracerebral hemorrhage with intraventricular extension. No significant change since prior study. Stable mild communicating hydrocephalus. No significant midline shift. Vascular: No hyperdense vessel or unexpected calcification. Skull: No acute calvarial abnormality. 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 No change since prior study. Electronically Signed   By: Rolm Baptise M.D.   On: 12/13/2020 21:40   CT HEAD WO CONTRAST  Result Date: 12/12/2020 CLINICAL DATA:  Stroke follow-up.  Pupillary asymmetry. EXAM: CT HEAD WITHOUT CONTRAST TECHNIQUE: Contiguous axial images were obtained from the base of the skull through the vertex without intravenous contrast. COMPARISON:  Head CT 12/11/2020 at 7:28 p.m. FINDINGS: Brain: Unchanged appearance of intraparenchymal hematoma centered at the right caudate head. There is extension of hemorrhage into the ventricles with mild communicating hydrocephalus. There is no midline shift. No new site of hemorrhage. Vascular: No abnormal hyperdensity of the major intracranial arteries or dural venous sinuses. No intracranial atherosclerosis. Skull: The visualized skull base, calvarium and extracranial soft tissues are normal. Sinuses/Orbits: No fluid levels or advanced mucosal thickening of the visualized paranasal sinuses. No mastoid or middle ear effusion. The orbits are normal. IMPRESSION:  Unchanged appearance of intraparenchymal hematoma centered at the right caudate head with intraventricular extension and mild communicating hydrocephalus. Electronically Signed   By: Ulyses Jarred M.D.   On: 12/12/2020 00:10   CT Head Wo Contrast  Result Date: 12/11/2020 CLINICAL DATA:  Headache EXAM: CT HEAD WITHOUT CONTRAST TECHNIQUE: Contiguous axial images were obtained from the base of the skull through the vertex  without intravenous contrast. COMPARISON:  CT brain 02/19/2020 FINDINGS: Brain: 19 x 20 mm acute hemorrhage within the right caudate/basal ganglia. Moderate acute hemorrhage within the right lateral ventricle with additional hemorrhage in the third and fourth ventricles. The ventricles are mildly enlarged. No midline shift. Mild chronic small vessel ischemic changes of the white matter. Vascular: No hyperdense vessels.  No unexpected calcification Skull: Normal. Negative for fracture or focal lesion. Sinuses/Orbits: No acute finding. Other: None IMPRESSION: 1. Acute hemorrhage within the right caudate/basal ganglia with hemorrhage in the right lateral, third, and fourth ventricles. No midline shift. The ventricles are slightly dilated as compared with prior CT from 2021. 2. Mild chronic small vessel ischemic change of the white matter. Critical Value/emergent results were called by telephone at the time of interpretation on 12/11/2020 at 7:45 pm to provider ADAM CURATOLO , who verbally acknowledged these results. Electronically Signed   By: Donavan Foil M.D.   On: 12/11/2020 19:50   MR MRA HEAD WO CONTRAST  Result Date: 12/12/2020 CLINICAL DATA:  Intracranial hemorrhage. EXAM: MRI HEAD WITHOUT AND WITH CONTRAST MRA HEAD WITHOUT CONTRAST TECHNIQUE: Multiplanar, multi-echo pulse sequences of the brain and surrounding structures were acquired without and with intravenous contrast. Angiographic images of the Circle of Willis were acquired using MRA technique without intravenous contrast. CONTRAST:  95m GADAVIST GADOBUTROL 1 MMOL/ML IV SOLN COMPARISON:  Head CT 12/11/2020 FINDINGS: MRI HEAD FINDINGS Brain: Unchanged intraparenchymal hematoma of the right caudate head with intraventricular extension. 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 No acute infarct. There is multifocal hyperintense T2-weighted signal within the white matter. Generalized volume loss without a clear lobar predilection. There is no  abnormal contrast enhancement. Vascular: Major flow voids are preserved. Skull and upper cervical spine: Normal calvarium and skull base. Visualized upper cervical spine and soft tissues are normal. Sinuses/Orbits:No paranasal sinus fluid levels or advanced mucosal thickening. No mastoid or middle ear effusion. Normal orbits. MRA HEAD FINDINGS POSTERIOR CIRCULATION: --Vertebral arteries: Normal --Inferior cerebellar arteries: Normal. --Basilar artery: Normal. --Superior cerebellar arteries: Normal. --Posterior cerebral arteries: Normal. Both are predominantly supplied by the posterior communicating arteries (p-comm). ANTERIOR CIRCULATION: --Intracranial internal carotid arteries: Normal. --Anterior cerebral arteries (ACA): Normal. --Middle cerebral arteries (MCA): Normal. ANATOMIC VARIANTS: Fetal origins of both posterior cerebral arteries. IMPRESSION: 1. Unchanged intraparenchymal hematoma of the right caudate head with intraventricular extension and mild communicating hydrocephalus. 2. Normal intracranial MRA. Electronically Signed   By: KUlyses JarredM.D.   On: 12/12/2020 20:50   MR BRAIN W WO CONTRAST  Result Date: 12/12/2020 CLINICAL DATA:  Intracranial hemorrhage. EXAM: MRI HEAD WITHOUT AND WITH CONTRAST MRA HEAD WITHOUT CONTRAST TECHNIQUE: Multiplanar, multi-echo pulse sequences of the brain and surrounding structures were acquired without and with intravenous contrast. Angiographic images of the Circle of Willis were acquired using MRA technique without intravenous contrast. CONTRAST:  955mGADAVIST GADOBUTROL 1 MMOL/ML IV SOLN COMPARISON:  Head CT 12/11/2020 FINDINGS: MRI HEAD FINDINGS Brain: Unchanged intraparenchymal hematoma of the right caudate head with intraventricular extension. 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 No acute infarct. There is multifocal hyperintense T2-weighted signal within the white matter. Generalized volume  loss without a clear lobar predilection. There is no abnormal  contrast enhancement. Vascular: Major flow voids are preserved. Skull and upper cervical spine: Normal calvarium and skull base. Visualized upper cervical spine and soft tissues are normal. Sinuses/Orbits:No paranasal sinus fluid levels or advanced mucosal thickening. No mastoid or middle ear effusion. Normal orbits. MRA HEAD FINDINGS POSTERIOR CIRCULATION: --Vertebral arteries: Normal --Inferior cerebellar arteries: Normal. --Basilar artery: Normal. --Superior cerebellar arteries: Normal. --Posterior cerebral arteries: Normal. Both are predominantly supplied by the posterior communicating arteries (p-comm). ANTERIOR CIRCULATION: --Intracranial internal carotid arteries: Normal. --Anterior cerebral arteries (ACA): Normal. --Middle cerebral arteries (MCA): Normal. ANATOMIC VARIANTS: Fetal origins of both posterior cerebral arteries. IMPRESSION: 1. Unchanged intraparenchymal hematoma of the right caudate head with intraventricular extension and mild communicating hydrocephalus. 2. Normal intracranial MRA. Electronically Signed   By: Ulyses Jarred M.D.   On: 12/12/2020 20:50   EEG adult  Result Date: 12/14/2020 Lora Havens, MD     12/15/2020  7:07 AM Patient Name: Matthew Francis MRN: [id number] Epilepsy Attending: Lora Havens Referring Physician/Provider: Dr Consuella Lose Date: 12/14/2020 Duration: 24.46 mins Patient history: 60 y.o. male with basal ganglia hemorrhage with intraventricular extension who has been neurologically well, with what appears to be episodic confusion and somnolence. EEG to evaluate for seizure  Level of alertness: Awake  AEDs during EEG study: None Technical aspects: This EEG study was done with scalp electrodes positioned according to the 10-20 International system of electrode placement. Electrical activity was acquired at a sampling rate of 500Hz  and reviewed with a high frequency filter of 70Hz  and a low frequency filter of 1Hz . EEG data were recorded continuously and digitally  stored. Description: The posterior dominant rhythm consists of 8 Hz activity of moderate voltage (25-35 uV) seen predominantly in posterior head regions, symmetric and reactive to eye opening and eye closing. EEG showed continuous 3 to 6 Hz theta-delta slowing in right hemisphere, maximal temporal region. Hyperventilation and photic stimulation were not performed.   ABNORMALITY - Continuous slow, right hemisphere, maximal temporal region IMPRESSION: This study is suggestive of cortical dysfunction arising from right hemisphere, maximal right temporal region, likely secondary to underlying structural abnormality/bleed. No seizures or epileptiform discharges were seen throughout the recording. Priyanka Barbra Sarks   Overnight EEG with video  Result Date: 12/15/2020 Lora Havens, MD     12/15/2020  7:07 AM Patient Name: MARSDEN ZAINO MRN: [id number] Epilepsy Attending: Lora Havens Referring Physician/Provider: Dr Consuella Lose Duration: 12/14/2020 1932 to 12/15/2020 0700  Patient history: 60 y.o. male with basal ganglia hemorrhage with intraventricular extension who has been neurologically well, with what appears to be episodic confusion and somnolence. EEG to evaluate for seizure  Level of alertness: Awake  AEDs during EEG study: None  Technical aspects: This EEG study was done with scalp electrodes positioned according to the 10-20 International system of electrode placement. Electrical activity was acquired at a sampling rate of 500Hz  and reviewed with a high frequency filter of 70Hz  and a low frequency filter of 1Hz . EEG data were recorded continuously and digitally stored.  Description: The posterior dominant rhythm consists of 8 Hz activity of moderate voltage (25-35 uV) seen predominantly in posterior head regions, symmetric and reactive to eye opening and eye closing. EEG showed continuous 3 to 6 Hz theta-delta slowing in right hemisphere, maximal temporal region. Hyperventilation and photic stimulation  were not performed.    ABNORMALITY - Continuous slow, right hemisphere, maximal temporal region  IMPRESSION: This study is  suggestive of cortical dysfunction arising from right hemisphere, maximal right temporal region, likely secondary to underlying structural abnormality/bleed. No seizures or epileptiform discharges were seen throughout the recording.  Lora Havens   ECHOCARDIOGRAM COMPLETE  Result Date: 12/12/2020    ECHOCARDIOGRAM REPORT   Patient Name:   NEEDHAM BIGGINS Date of Exam: 12/12/2020 Medical Rec #:  [id number]     Height:       70.5 in Accession #:    [id number]    Weight:       238.1 lb Date of Birth:  02-19-1961     BSA:          2.260 m Patient Age:    21 years      BP:           138/94 mmHg Patient Gender: M             HR:           95 bpm. Exam Location:  Inpatient Procedure: 2D Echo, Color Doppler and Cardiac Doppler Indications:    Stroke I63.9  History:        Patient has no prior history of Echocardiogram examinations.                 Risk Factors:Dyslipidemia and Hypertension.  Sonographer:    Bernadene Person RDCS Referring Phys: [id number] [place]  1. Left ventricular ejection fraction, by estimation, is 60 to 65%. The left ventricle has normal function. The left ventricle has no regional wall motion abnormalities. Left ventricular diastolic parameters are consistent with Grade I diastolic dysfunction (impaired relaxation).  2. Right ventricular systolic function is normal. The right ventricular size is normal. There is normal pulmonary artery systolic pressure. The estimated right ventricular systolic pressure is 13.2 mmHg.  3. The mitral valve is normal in structure. Trivial mitral valve regurgitation. No evidence of mitral stenosis.  4. The aortic valve is normal in structure. Aortic valve regurgitation is not visualized. No aortic stenosis is present.  5. The inferior vena cava is normal in size with greater than 50% respiratory variability, suggesting right  atrial pressure of 3 mmHg. Conclusion(s)/Recommendation(s): No intracardiac source of embolism detected on this transthoracic study. A transesophageal echocardiogram is recommended to exclude cardiac source of embolism if clinically indicated. FINDINGS  Left Ventricle: Left ventricular ejection fraction, by estimation, is 60 to 65%. The left ventricle has normal function. The left ventricle has no regional wall motion abnormalities. The left ventricular internal cavity size was normal in size. There is  borderline left ventricular hypertrophy. Left ventricular diastolic parameters are consistent with Grade I diastolic dysfunction (impaired relaxation). Right Ventricle: The right ventricular size is normal. No increase in right ventricular wall thickness. Right ventricular systolic function is normal. There is normal pulmonary artery systolic pressure. The tricuspid regurgitant velocity is 2.04 m/s, and  with an assumed right atrial pressure of 3 mmHg, the estimated right ventricular systolic pressure is 44.0 mmHg. Left Atrium: Left atrial size was normal in size. Right Atrium: Right atrial size was normal in size. Pericardium: There is no evidence of pericardial effusion. Mitral Valve: The mitral valve is normal in structure. Trivial mitral valve regurgitation. No evidence of mitral valve stenosis. Tricuspid Valve: The tricuspid valve is normal in structure. Tricuspid valve regurgitation is not demonstrated. No evidence of tricuspid stenosis. Aortic Valve: The aortic valve is normal in structure. Aortic valve regurgitation is not visualized. No aortic stenosis is present. Pulmonic Valve: The pulmonic valve was  normal in structure. Pulmonic valve regurgitation is mild. No evidence of pulmonic stenosis. Aorta: The aortic root is normal in size and structure. Venous: The inferior vena cava is normal in size with greater than 50% respiratory variability, suggesting right atrial pressure of 3 mmHg. IAS/Shunts: No atrial  level shunt detected by color flow Doppler.  LEFT VENTRICLE PLAX 2D LVIDd:         4.20 cm  Diastology LVIDs:         2.40 cm  LV e' medial:    5.78 cm/s LV PW:         1.10 cm  LV E/e' medial:  9.8 LV IVS:        0.90 cm  LV e' lateral:   10.20 cm/s LVOT diam:     2.00 cm  LV E/e' lateral: 5.5 LV SV:         65 LV SV Index:   29 LVOT Area:     3.14 cm  RIGHT VENTRICLE RV S prime:     14.30 cm/s TAPSE (M-mode): 1.6 cm LEFT ATRIUM             Index       RIGHT ATRIUM           Index LA diam:        3.50 cm 1.55 cm/m  RA Area:     13.90 cm LA Vol (A2C):   51.3 ml 22.70 ml/m RA Volume:   35.30 ml  15.62 ml/m LA Vol (A4C):   57.2 ml 25.31 ml/m LA Biplane Vol: 59.3 ml 26.24 ml/m  AORTIC VALVE LVOT Vmax:   124.00 cm/s LVOT Vmean:  85.300 cm/s LVOT VTI:    0.206 m  AORTA Ao Root diam: 3.30 cm Ao Asc diam:  3.30 cm MITRAL VALVE               TRICUSPID VALVE MV Area (PHT): 2.26 cm    TR Peak grad:   16.6 mmHg MV Decel Time: 335 msec    TR Vmax:        204.00 cm/s MV E velocity: 56.40 cm/s MV A velocity: 62.90 cm/s  SHUNTS MV E/A ratio:  0.90        Systemic VTI:  0.21 m                            Systemic Diam: 2.00 cm Cherlynn Kaiser MD Electronically signed by Cherlynn Kaiser MD Signature Date/Time: 12/12/2020/4:46:34 PM    Final    VAS US CAROTID  Result Date: 12/13/2020 Carotid Arterial Duplex Study Patient Name:  LONDON TARNOWSKI  Date of Exam:   12/13/2020 Medical Rec #: [id number]      Accession #:    [id number] Date of Birth: 20-Jan-1961      Patient Gender: M Patient Age:   59Y Exam Location:  [facility] [place] Procedure:      VAS US CAROTID Referring Phys: [id number] Rosalin Hawking --------------------------------------------------------------------------------  Indications:       CVA. Risk Factors:      Hypertension, hyperlipidemia. Comparison Study:  no prior Performing Technologist: Archie Patten RVS  Examination Guidelines: A complete evaluation includes B-mode imaging, spectral Doppler, color Doppler, and  power Doppler as needed of all accessible portions of each vessel. Bilateral testing is considered an integral part of a complete examination. Limited examinations for reoccurring indications may be performed as noted.  Right Carotid Findings: +----------+--------+--------+--------+------------------+--------+  PSV cm/sEDV cm/sStenosisPlaque DescriptionComments +----------+--------+--------+--------+------------------+--------+ CCA Prox  77      9               heterogenous               +----------+--------+--------+--------+------------------+--------+ CCA Distal65      14              heterogenous               +----------+--------+--------+--------+------------------+--------+ ICA Prox  39      13      1-39%   heterogenous               +----------+--------+--------+--------+------------------+--------+ ICA Distal37      14                                         +----------+--------+--------+--------+------------------+--------+ ECA       69      11                                         +----------+--------+--------+--------+------------------+--------+ +----------+--------+-------+--------+-------------------+           PSV cm/sEDV cmsDescribeArm Pressure (mmHG) +----------+--------+-------+--------+-------------------+ [id number]                                         +----------+--------+-------+--------+-------------------+ +---------+--------+--+--------+-+---------+ VertebralPSV cm/s31EDV cm/s9Antegrade +---------+--------+--+--------+-+---------+  Left Carotid Findings: +----------+--------+--------+--------+------------------+--------+           PSV cm/sEDV cm/sStenosisPlaque DescriptionComments +----------+--------+--------+--------+------------------+--------+ CCA Prox  118     22              heterogenous               +----------+--------+--------+--------+------------------+--------+ CCA Distal63      21               heterogenous               +----------+--------+--------+--------+------------------+--------+ ICA Prox  53      22      1-39%   heterogenous               +----------+--------+--------+--------+------------------+--------+ ICA Distal65      26                                         +----------+--------+--------+--------+------------------+--------+ ECA       73      18                                         +----------+--------+--------+--------+------------------+--------+ +----------+--------+--------+--------+-------------------+           PSV cm/sEDV cm/sDescribeArm Pressure (mmHG) +----------+--------+--------+--------+-------------------+ [id number]                                          +----------+--------+--------+--------+-------------------+ +---------+--------+--+--------+-+---------+ VertebralPSV cm/s32EDV cm/s9Antegrade +---------+--------+--+--------+-+---------+   Summary: Right Carotid: Velocities in the right ICA are consistent with a 1-39% stenosis. Left  Carotid: Velocities in the left ICA are consistent with a 1-39% stenosis. Vertebrals: Bilateral vertebral arteries demonstrate antegrade flow. *See table(s) above for measurements and observations.     Preliminary      PHYSICAL EXAM  Temp:  [98.1 F (36.7 C)-99.1 F (37.3 C)] 98.3 F (36.8 C) (07/02 0800) Pulse Rate:  [51-94] 66 (07/02 0915) Resp:  [13-33] 16 (07/02 0915) BP: (124-158)/(72-110) 150/93 (07/02 0915) SpO2:  [89 %-98 %] 96 % (07/02 0915)  General - Well nourished, well developed, in moderate distress  Ophthalmologic - resists eye opening.  Cardiovascular - Regular rhythm and rate.  Mental Status -  Awake and orientated to person, place and time. Language including expression, naming, repetition, comprehension was intact. Fund of Knowledge was assessed and was intact.  Cranial Nerves II - XII - II - Visual field intact to threat. III, IV, VI -  Extraocular movements intact. V - Facial sensation intact bilaterally. VII - Facial movement intact bilaterally. VIII - Hearing & vestibular intact bilaterally. X - did not cooperate. XI - Head turn intact. XII - Tongue protrusion intact.  Motor Strength - The patient's strength was normal in all extremities and pronator drift was absent.  Bulk was normal and fasciculations were absent.   Motor Tone - Muscle tone was assessed at the neck and appendages and was normal.  Reflexes - The patient's reflexes were symmetrical in all extremities and he had no pathological reflexes.  Sensory - Light touch, temperature/pinprick were assessed and were symmetrical.    Coordination - The patient had normal movements in the hands with no ataxia or dysmetria.  Tremor was absent.  Gait and Station - deferred.   ASSESSMENT/PLAN Mr. MAXTON NOREEN is a 60 y.o. male with history of ulcerative colitis, HTN, HLD, DM, CKD, lone A. fib admitted for headache, confusion, nausea vomiting. No tPA given due to [place]. Overnight 12/14/2020, more confused and STAT CT head showed stable hemorrhage and  increase in ventricular size including temporal horns. Neurosurgery evaluated and determined hydrocephalus would not cause episodic symptoms and EEG did not show epileptiform discharges. Repeat CT head 12/15/2020 showed Improved hydrocephalus. He is currently AAOx3.  ICH:  right caudate head ICH with IVH likely secondary to hypertensive emergency and small vessel disease source CT right caudate head ICH with IVH. CT head 12/15/2020 stable hematoma and stable ventricular size MRI with and without contrast unchanged hematoma, no underlying mass MRA unremarkable, no aneurysm or AVM Carotid Doppler unremarkable 2D Echo EF 60 to 65% LDL 166 HgbA1c 5.9 SCDs for VTE prophylaxis No antithrombotic prior to admission, now on No antithrombotic given ICH Patient counseled to be compliant with his antithrombotic medications Ongoing  aggressive stroke risk factor management Therapy recommendations:  none Disposition:  pending   IVH CT showed IVH right lateral ventricle, third ventricle and partial fourth ventricle Overnight 12/14/2020, more confused, STAT CT head stable hemorrhage and mild hydrocephalus. Neurosurgery opted no EVD. Repeat CT head 12/15/2020 showed Improved hydrocephalus. LTM EEG did not reveal epileptiform activity. CT repeat 7/1 some improvement of III ventricle Improved HA which was likely related to IVH and blood products  Short term oxycodone PRN - Hold. Continue to monitor.  Ulcerative colitis Frequent diarrhea Follows with GI at [facility] Recently prescribed Xifaxan but not able to afford the cost Restart Xifaxan this time as inpt On loperamide  Diabetes HgbA1c 5.9, goal < 7.0 Controlled CBG monitoring SSI DM education and close PCP follow up  Hypertension Home meds: Hydralazine, Norvasc and  atenolol BP goal less than 160 On hydralazine 100 bid and Norvasc Long term BP goal normotensive  Hyperlipidemia Home meds: Crestor 10 every other day and Zetia LDL 166, goal < 70 Now on Crestor 20 daily Continue statin and Zetia at discharge  Cocaine abuse UDS positive for cocaine Avoid selective beta-blockers including metoprolol, atenolol Cessation education provided Patient is willing to quit  AKI creatinine 1.31-> 1.17->1.50->1.39 likely due to dehydration from diarrhea, which is better today Encourage po intake Management per primary team  Other Stroke Risk Factors Obesity   Other Active Problems Lone A. fib in 01/2020 in the setting of sepsis Hx of opioids abuse  Lower back spasms which radiate into lower extremities causing severe pain. Fentanyl 54mg every 4 hours PRN Start baclofen 534mthree times daily and may increase as needed.    Late addendum:  His pain is improved and much less agitated. He may resume PO diet. The patient remains alert, oriented and stable and is  ready to be transferred to stepdown unit.  Hospital day # 4  HuLynnae SandhoffMD Stroke Neurology Page: [phone]/07/2020 9:51 AM  This patient is critically ill due to severe IVH and at significant risk of neurological worsening, death form brain herniation, brainstem compression, cardio arrest, hydrocephalus, seizure. This patient's care requires constant monitoring of vital signs, hemodynamics, respiratory and cardiac monitoring, review of multiple databases, neurological assessment, discussion with family, other specialists and medical decision making of high complexity. I spent 45 minutes of neurocritical care time in the care of this patient.   To contact Stroke Continuity provider, please refer to [email or web address] hours, contact General Neurology

## 2020-12-15 NOTE — Progress Notes (Signed)
Pt still agitated, cannot sit still, c/o pain in legs. Discussed with MD, orders given.

## 2020-12-15 NOTE — Progress Notes (Addendum)
Pt having frequent drops in HR to 30s, nonsustained. BP, RR, sat WNL. Alert, c/o back spasms. MD Rory Percy notified. CCM consult ordered. Dr Tacy Learn notified. [facility] transfer cancelled.

## 2020-12-15 NOTE — Progress Notes (Signed)
EEG maintenance complete. No skin breakdown at [place] O1 T3. Continue to monitor

## 2020-12-15 NOTE — Progress Notes (Signed)
Pt c/o ache and burning in legs. Fioricet not due for 4 more hours, MDs notified.

## 2020-12-15 NOTE — Procedures (Addendum)
Patient Name: Matthew Francis  MRN: [id number]  Epilepsy Attending: Lora Havens  Referring Physician/Provider: Dr Consuella Lose Duration: 12/14/2020 1932 to 12/15/2020 1244   Patient history: 60 y.o. male with basal ganglia hemorrhage with intraventricular extension who has been neurologically well, with what appears to be episodic confusion and somnolence. EEG to evaluate for seizure   Level of alertness: Awake   AEDs during EEG study: None   Technical aspects: This EEG study was done with scalp electrodes positioned according to the 10-20 International system of electrode placement. Electrical activity was acquired at a sampling rate of 500Hz  and reviewed with a high frequency filter of 70Hz  and a low frequency filter of 1Hz . EEG data were recorded continuously and digitally stored.   Description: The posterior dominant rhythm consists of 8 Hz activity of moderate voltage (25-35 uV) seen predominantly in posterior head regions, symmetric and reactive to eye opening and eye closing. EEG initially showed continuous 3 to 6 Hz theta-delta slowing in right hemisphere, maximal temporal region which gradually improved to intermittent 3-5hz  theta-delta slowing in right hemisphere, maximal temporal region. Hyperventilation and photic stimulation were not performed.      ABNORMALITY - Continuous slow, right hemisphere, maximal temporal region   IMPRESSION: This study is suggestive of cortical dysfunction arising from right hemisphere, maximal right temporal region, likely secondary to underlying structural abnormality/bleed. There was gradual improvement in slowing which could indicate that it was post-ictal slowing as well.  No seizures or epileptiform discharges were seen throughout the recording.   Saydie Gerdts Barbra Sarks

## 2020-12-15 NOTE — Progress Notes (Signed)
  NEUROSURGERY PROGRESS NOTE   No issues overnight.   EXAM:  BP (!) 150/93   Pulse 66   Temp 98.3 F (36.8 C) (Oral)   Resp 16   SpO2 96%   Awake, alert, oriented  Speech fluent, appropriate  CN grossly intact  5/5 BUE/BLE   IMAGING: CTH reviewed and demonstrates stable size of hemorrhage, there has been interval resolution of previously seen ventriculomegaly, now back to baseline compared to prior CT scans.  IMPRESSION:  60 y.o. male with right caudate hemorrhage and IV extension, does not appear to have HCP  PLAN: - Cont supportive care per neurology - Does not appear to require EVD    Consuella Lose, MD [facility]

## 2020-12-15 NOTE — Progress Notes (Signed)
vLTM EEG complete. No skin breakdown 

## 2020-12-15 NOTE — Progress Notes (Signed)
Pain and agitation discussed with Dr Kathyrn Sheriff.

## 2020-12-16 ENCOUNTER — Encounter (HOSPITAL_COMMUNITY): Payer: Self-pay | Admitting: Neurology

## 2020-12-16 DIAGNOSIS — I161 Hypertensive emergency: Secondary | ICD-10-CM

## 2020-12-16 LAB — URINALYSIS, ROUTINE W REFLEX MICROSCOPIC
Bilirubin Urine: NEGATIVE
Glucose, UA: NEGATIVE mg/dL
Hgb urine dipstick: NEGATIVE
Ketones, ur: NEGATIVE mg/dL
Leukocytes,Ua: NEGATIVE
Nitrite: NEGATIVE
Protein, ur: NEGATIVE mg/dL
Specific Gravity, Urine: 1.011 (ref 1.005–1.030)
pH: 6 (ref 5.0–8.0)

## 2020-12-16 LAB — GLUCOSE, CAPILLARY
Glucose-Capillary: 137 mg/dL — ABNORMAL HIGH (ref 70–99)
Glucose-Capillary: 125 mg/dL — ABNORMAL HIGH (ref 70–99)

## 2020-12-16 MED ORDER — BACLOFEN 10 MG PO TABS
10.0000 mg | ORAL_TABLET | Freq: Three times a day (TID) | ORAL | Status: DC
  Administered 2020-12-16 (×2): 10 mg via ORAL
  Filled 2020-12-16 (×3): qty 1

## 2020-12-16 MED ORDER — HYDRALAZINE HCL 20 MG/ML IJ SOLN: 10.0000 mg | INTRAMUSCULAR | Status: DC | PRN

## 2020-12-16 NOTE — Consult Note (Signed)
NAME:  Matthew Francis, MRN:  [id number], DOB:  11-Apr-1961, LOS: 5 ADMISSION DATE:  12/11/2020, CONSULTATION DATE:  12/15/20  REFERRING MD:  Rory Percy , CHIEF COMPLAINT:  bradycardia, headache initially   History of Present Illness:  Mr. Fonder is a 60 year old man with a history of HTN, HLD, Arthritis, GERD, headaches, here with hemorrhagic stroke.  He initially presented on 6/28 with HA, severely elevated BP after not taking BP meds, found to have IPH and started on nicardipine.  Admitted to neurology service.  IPH: right caudate head ICH with IVH likely secondary to hypertensive emergency and small vessel disease in the setting of cocaine abuse  Increased  N/v and AMS,  and repeat CT head showed hydrocephalus 7/1.  Since waxing and waning NSG thought not c/w clinically significant hydrocephalus.    7/2: pain in legs and agitation.  Had been on oxycodone, started fentanyl 7/2 and baclofen.   Temperature increasing (last measured 100.5) Got fentanyl 92mg x 3 today  Started baclofen, received x 1.  Also getting fioricet (twice today)  \Complaining of back spasm, says baclofen was not helfpul.    Critical care consult called due to episodes of bradycardia.   Significant Hospital Events: Including procedures, antibiotic start and stop dates in addition to other pertinent events     Interim History / Subjective:   EKG showed sinus bradycardia with rate in 50s, with sinus node dysfunction Now heart rate improved to 70s, patient denies chest pain, dizziness, remained asymptomatic  Objective   Blood pressure (!) 138/93, pulse 68, temperature 99.3 F (37.4 C), temperature source Axillary, resp. rate (!) 24, height 5' 11"  (1.803 m), weight 89.2 kg, SpO2 97 %.        Intake/Output Summary (Last 24 hours) at 12/16/2020 1129 Last data filed at 12/16/2020 0400 Gross per 24 hour  Intake --  Output 525 ml  Net -525 ml    Filed Weights   12/15/20 1400  Weight: 89.2 kg    Examination:    Physical exam: General: Acutely ill-appearing male, lying on the bed HEENT: [place]/AT, eyes anicteric.  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 in the setting of uncontrolled hypertension and cocaine abuse Defer management to stroke team Maintain blood pressure SBP goal less than 160  Acute kidney injury likely due to uncontrolled hypertension Serum creatinine improved Closely monitor  PCCM will sign off, please call with questions Best Practice (right click and "Reselect all SmartList Selections" daily)   Diet/type: Heart healthy diet DVT prophylaxis: not indicated GI prophylaxis: N/A Lines: N/A Foley:  N/A Code Status:  full code Last date of multidisciplinary goals of care discussion []   Labs   CBC: Recent Labs  Lab 12/11/20 1900 12/12/20 0212 12/13/20 0316 12/14/20 0350 12/15/20 0033  WBC 6.9 8.6 8.3 8.1 10.6*  NEUTROABS 5.5 7.8* 4.7 6.0 7.8*  HGB 13.4 13.9 13.5 13.8 13.7  HCT 41.9 42.7 41.3 43.3 42.5  MCV 95.4 93.4 94.3 95.2 93.6  PLT 203 215 207 212 195     Basic Metabolic Panel: Recent Labs  Lab 12/11/20 1900 12/12/20 0212 12/13/20 0316 12/14/20 0350 12/15/20 0033  NA 141 140 140 140 135  K 3.9 3.6 3.9 3.9 4.0  CL 108 109 107 108 103  CO2 24 20* 25 23 21*  GLUCOSE 125* 146* 100* 136* 120*  BUN 10 9 17 12 8   CREATININE 1.31* 1.17 1.50*  1.39* 1.05  CALCIUM 10.2 9.9 10.2 9.8 9.6  MG  --   --   --  2.5*  --   PHOS  --   --   --  2.7  --     GFR: Estimated Creatinine Clearance: 82.7 mL/min (by C-G formula based on SCr of 1.05 mg/dL). Recent Labs  Lab 12/12/20 0212 12/13/20 0316 12/14/20 0350 12/15/20 0033  WBC 8.6 8.3 8.1 10.6*     Liver Function Tests: Recent Labs  Lab 12/11/20 1900 12/14/20 0350  AST 16 26  ALT 15 20  ALKPHOS 76 62  BILITOT 1.0 1.1  PROT 8.4* 7.5  ALBUMIN 4.5 4.1    No results for input(s): LIPASE, AMYLASE in the last 168 hours. No results for input(s): AMMONIA in the last 168 hours.  ABG No results found for: PHART, PCO2ART, PO2ART, HCO3, TCO2, ACIDBASEDEF, O2SAT   Coagulation Profile: Recent Labs  Lab 12/11/20 1900  INR 0.9     Cardiac Enzymes: No results for input(s): CKTOTAL, CKMB, CKMBINDEX, TROPONINI in the last 168 hours.  HbA1C: Hgb A1c MFr Bld  Date/Time Value Ref Range Status  12/12/2020 02:12 AM 5.9 (H) 4.8 - 5.6 % Final    Comment:    (NOTE)         Prediabetes: 5.7 - 6.4         Diabetes: >6.4         Glycemic control for adults with diabetes: <7.0     CBG: Recent Labs  Lab 12/14/20 2141 12/15/20 1115 12/15/20 1713 12/15/20 2159 12/16/20 0821  GLUCAP 126* 118* 124* 126* 137*    EEG 7/1: This study is suggestive of cortical dysfunction arising from right hemisphere, maximal right temporal region, likely secondary to underlying structural abnormality/bleed. No seizures or epileptiform discharges were seen throughout the recording.  EEG 7/2: IMPRESSION: This study is suggestive of cortical dysfunction arising from right hemisphere, maximal right temporal region, likely secondary to underlying structural abnormality/bleed. No seizures or epileptiform discharges were seen throughout the recording.  EKG: sinus   MRI 6/29:  IMPRESSION: 1. Unchanged intraparenchymal hematoma of the right caudate head with intraventricular extension and mild  communicating hydrocephalus. 2. Normal intracranial MRA.  12/15/20:  IMPRESSION: 1. Improved hydrocephalus. 2. Persistent intraventricular extension of hemorrhage from the right caudate head hematoma.  7/1 ct head IMPRESSION: Persistent hemorrhage in the head of the caudate nucleus on the right with evidence of interventricular thrombus in the lateral ventricles bilaterally and third ventricle. There is interval dilatation of the lateral and third ventricles with findings of thrombus and apparent occlusion of the cerebral aqueduct related to change in the morphology of the third ventricular thrombus. Additionally signs of increased intracranial pressure are noted with decreased sulcal markings when compared with the previous day.  6/30 CT   IMPRESSION: Stable right basal ganglia hemorrhage with intraventricular extension. No change since prior study.   6/28 CT IMPRESSION: 1. Acute hemorrhage within the right caudate/basal ganglia with hemorrhage in the right lateral, third, and fourth  ventricles. No midline shift. The ventricles are slightly dilated as compared with prior CT from 2021. 2. Mild chronic small vessel ischemic change of the white matter.     Jacky Kindle MD [facility] See Amion for pager If no response to pager, please call [phone] until 7pm After 7pm, Please call E-link [phone]

## 2020-12-16 NOTE — Progress Notes (Signed)
[place] Progress Note Patient Name: RYATT CORSINO DOB: 10/04/1960 MRN: [id number]   Date of Service  12/16/2020  HPI/Events of Note  Hypertension - BP = 187/115.  eICU Interventions  Plan: Hydralazine 10 mg IV Q 4 hours PRN SBP < 170 or DBP > 100.     Intervention Category Major Interventions: Hypertension - evaluation and management  Coby Antrobus Eugene 12/16/2020, 11:59 PM

## 2020-12-16 NOTE — Progress Notes (Addendum)
STROKE TEAM PROGRESS NOTE   SUBJECTIVE (INTERVAL HISTORY) No family is at the bedside. Overnight had brief asymptomatic bradycardia and transfer held. This morning he is at baseline.  This morning complaining of severe lower back pain/spasms radiating into b/l LE now on baclofen. He had opioid abuse in the past, but short term opioids use at this time is reasonable.   OBJECTIVE Temp:  [98.7 F (37.1 C)-100.5 F (38.1 C)] 99.3 F (37.4 C) (07/03 0800) Pulse Rate:  [43-219] 66 (07/03 0830) Resp:  [14-32] 21 (07/03 0830) BP: (124-165)/(68-129) 140/90 (07/03 0830) SpO2:  [91 %-100 %] 96 % (07/03 0830) Weight:  [89.2 kg] 89.2 kg (07/02 1400)  Recent Labs  Lab 12/14/20 2141 12/15/20 1115 12/15/20 1713 12/15/20 2159 12/16/20 0821  GLUCAP 126* 118* 124* 126* 137*   Recent Labs  Lab 12/11/20 1900 12/12/20 0212 12/13/20 0316 12/14/20 0350 12/15/20 0033  NA 141 140 140 140 135  K 3.9 3.6 3.9 3.9 4.0  CL 108 109 107 108 103  CO2 24 20* 25 23 21*  GLUCOSE 125* 146* 100* 136* 120*  BUN 10 9 17 12 8   CREATININE 1.31* 1.17 1.50* 1.39* 1.05  CALCIUM 10.2 9.9 10.2 9.8 9.6  MG  --   --   --  2.5*  --   PHOS  --   --   --  2.7  --    Recent Labs  Lab 12/11/20 1900 12/14/20 0350  AST 16 26  ALT 15 20  ALKPHOS 76 62  BILITOT 1.0 1.1  PROT 8.4* 7.5  ALBUMIN 4.5 4.1   Recent Labs  Lab 12/11/20 1900 12/12/20 0212 12/13/20 0316 12/14/20 0350 12/15/20 0033  WBC 6.9 8.6 8.3 8.1 10.6*  NEUTROABS 5.5 7.8* 4.7 6.0 7.8*  HGB 13.4 13.9 13.5 13.8 13.7  HCT 41.9 42.7 41.3 43.3 42.5  MCV 95.4 93.4 94.3 95.2 93.6  PLT 203 215 207 212 195   No results for input(s): CKTOTAL, CKMB, CKMBINDEX, TROPONINI in the last 168 hours. No results for input(s): LABPROT, INR in the last 72 hours.  Recent Labs    12/16/20 0446  COLORURINE YELLOW  LABSPEC 1.011  PHURINE 6.0  GLUCOSEU NEGATIVE  HGBUR NEGATIVE  BILIRUBINUR NEGATIVE  KETONESUR NEGATIVE  PROTEINUR NEGATIVE  NITRITE NEGATIVE   LEUKOCYTESUR NEGATIVE        Component Value Date/Time   CHOL 268 (H) 12/12/2020 0212   TRIG 71 12/12/2020 0212   HDL 88 12/12/2020 0212   CHOLHDL 3.0 12/12/2020 0212   VLDL 14 12/12/2020 0212   LDLCALC 166 (H) 12/12/2020 0212   Lab Results  Component Value Date   HGBA1C 5.9 (H) 12/12/2020      Component Value Date/Time   LABOPIA NONE DETECTED 12/12/2020 0324   COCAINSCRNUR POSITIVE (A) 12/12/2020 0324   LABBENZ NONE DETECTED 12/12/2020 0324   AMPHETMU NONE DETECTED 12/12/2020 0324   THCU NONE DETECTED 12/12/2020 0324   LABBARB NONE DETECTED 12/12/2020 0324    No results for input(s): ETH in the last 168 hours.  I have personally reviewed the radiological images below and agree with the radiology interpretations.  CT HEAD WO CONTRAST  Result Date: 12/15/2020 CLINICAL DATA:  Intracranial hemorrhage follow-up EXAM: CT HEAD WITHOUT CONTRAST TECHNIQUE: Contiguous axial images were obtained from the base of the skull through the vertex without intravenous contrast. COMPARISON:  12/14/2020 FINDINGS: Brain: Persistent intraventricular extension of hemorrhage from the right caudate head hematoma. Hydrocephalus has improved. No new site of hemorrhage. Vascular: No  abnormal hyperdensity of the major intracranial arteries or dural venous sinuses. No intracranial atherosclerosis. Skull: The visualized skull base, calvarium and extracranial soft tissues are normal. Sinuses/Orbits: No fluid levels or advanced mucosal thickening of the visualized paranasal sinuses. No mastoid or middle ear effusion. The orbits are normal. IMPRESSION: 1. Improved hydrocephalus. 2. Persistent intraventricular extension of hemorrhage from the right caudate head hematoma. Electronically Signed   By: Ulyses Jarred M.D.   On: 12/15/2020 02:22   CT HEAD WO CONTRAST  Result Date: 12/14/2020 CLINICAL DATA:  Follow-up intracranial hemorrhage EXAM: CT HEAD WITHOUT CONTRAST TECHNIQUE: Contiguous axial images were obtained  from the base of the skull through the vertex without intravenous contrast. COMPARISON:  12/13/2020 FINDINGS: Brain: There again noted changes consistent with hemorrhage in the head of the caudate nucleus on the right. Intraventricular extension into the right lateral ventricle is noted. The thrombus is stable although there are changes of progressive dilatation in the lateral and third ventricles. Fourth ventricle appears within normal limits. No new hemorrhage is seen. Findings suggestive of increasing intracranial pressure are noted with decrease in the sulcal markings when compared with the previous day. Thrombus is noted within the third ventricle which appears to extend into cerebral aqueduct causing the dilatation. Vascular: No hyperdense vessel or unexpected calcification. Skull: Normal. Negative for fracture or focal lesion. Sinuses/Orbits: No acute finding. Other: None. IMPRESSION: Persistent hemorrhage in the head of the caudate nucleus on the right with evidence of interventricular thrombus in the lateral ventricles bilaterally and third ventricle. There is interval dilatation of the lateral and third ventricles with findings of thrombus and apparent occlusion of the cerebral aqueduct related to change in the morphology of the third ventricular thrombus. Additionally signs of increased intracranial pressure are noted with decreased sulcal markings when compared with the previous day. Critical Value/emergent results were called by telephone at the time of interpretation on 12/14/2020 at 4:42 pm to Dr. Rosalin Hawking , who verbally acknowledged these results. Electronically Signed   By: [name] M.D.   On: 12/14/2020 16:43   CT HEAD WO CONTRAST  Result Date: 12/13/2020 CLINICAL DATA:  Worsening headache EXAM: CT HEAD WITHOUT CONTRAST TECHNIQUE: Contiguous axial images were obtained from the base of the skull through the vertex without intravenous contrast. COMPARISON:  12/11/2020 FINDINGS: Brain: Again  seen is the right basal ganglia intracerebral hemorrhage with intraventricular extension. No significant change since prior study. Stable mild communicating hydrocephalus. No significant midline shift. Vascular: No hyperdense vessel or unexpected calcification. Skull: No acute calvarial abnormality. 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 No change since prior study. Electronically Signed   By: Rolm Baptise M.D.   On: 12/13/2020 21:40   CT HEAD WO CONTRAST  Result Date: 12/12/2020 CLINICAL DATA:  Stroke follow-up.  Pupillary asymmetry. EXAM: CT HEAD WITHOUT CONTRAST TECHNIQUE: Contiguous axial images were obtained from the base of the skull through the vertex without intravenous contrast. COMPARISON:  Head CT 12/11/2020 at 7:28 p.m. FINDINGS: Brain: Unchanged appearance of intraparenchymal hematoma centered at the right caudate head. There is extension of hemorrhage into the ventricles with mild communicating hydrocephalus. There is no midline shift. No new site of hemorrhage. Vascular: No abnormal hyperdensity of the major intracranial arteries or dural venous sinuses. No intracranial atherosclerosis. Skull: The visualized skull base, calvarium and extracranial soft tissues are normal. Sinuses/Orbits: No fluid levels or advanced mucosal thickening of the visualized paranasal sinuses. No mastoid or middle ear effusion.  The orbits are normal. IMPRESSION: Unchanged appearance of intraparenchymal hematoma centered at the right caudate head with intraventricular extension and mild communicating hydrocephalus. Electronically Signed   By: Ulyses Jarred M.D.   On: 12/12/2020 00:10   CT Head Wo Contrast  Result Date: 12/11/2020 CLINICAL DATA:  Headache EXAM: CT HEAD WITHOUT CONTRAST TECHNIQUE: Contiguous axial images were obtained from the base of the skull through the vertex without intravenous contrast. COMPARISON:  CT brain  02/19/2020 FINDINGS: Brain: 19 x 20 mm acute hemorrhage within the right caudate/basal ganglia. Moderate acute hemorrhage within the right lateral ventricle with additional hemorrhage in the third and fourth ventricles. The ventricles are mildly enlarged. No midline shift. Mild chronic small vessel ischemic changes of the white matter. Vascular: No hyperdense vessels.  No unexpected calcification Skull: Normal. Negative for fracture or focal lesion. Sinuses/Orbits: No acute finding. Other: None IMPRESSION: 1. Acute hemorrhage within the right caudate/basal ganglia with hemorrhage in the right lateral, third, and fourth ventricles. No midline shift. The ventricles are slightly dilated as compared with prior CT from 2021. 2. Mild chronic small vessel ischemic change of the white matter. Critical Value/emergent results were called by telephone at the time of interpretation on 12/11/2020 at 7:45 pm to provider ADAM CURATOLO , who verbally acknowledged these results. Electronically Signed   By: Donavan Foil M.D.   On: 12/11/2020 19:50   MR MRA HEAD WO CONTRAST  Result Date: 12/12/2020 CLINICAL DATA:  Intracranial hemorrhage. EXAM: MRI HEAD WITHOUT AND WITH CONTRAST MRA HEAD WITHOUT CONTRAST TECHNIQUE: Multiplanar, multi-echo pulse sequences of the brain and surrounding structures were acquired without and with intravenous contrast. Angiographic images of the Circle of Willis were acquired using MRA technique without intravenous contrast. CONTRAST:  68m GADAVIST GADOBUTROL 1 MMOL/ML IV SOLN COMPARISON:  Head CT 12/11/2020 FINDINGS: MRI HEAD FINDINGS Brain: Unchanged intraparenchymal hematoma of the right caudate head with intraventricular extension. 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 No acute infarct. There is multifocal hyperintense T2-weighted signal within the white matter. Generalized volume loss without a clear lobar predilection. There is no abnormal contrast enhancement. Vascular: Major flow voids  are preserved. Skull and upper cervical spine: Normal calvarium and skull base. Visualized upper cervical spine and soft tissues are normal. Sinuses/Orbits:No paranasal sinus fluid levels or advanced mucosal thickening. No mastoid or middle ear effusion. Normal orbits. MRA HEAD FINDINGS POSTERIOR CIRCULATION: --Vertebral arteries: Normal --Inferior cerebellar arteries: Normal. --Basilar artery: Normal. --Superior cerebellar arteries: Normal. --Posterior cerebral arteries: Normal. Both are predominantly supplied by the posterior communicating arteries (p-comm). ANTERIOR CIRCULATION: --Intracranial internal carotid arteries: Normal. --Anterior cerebral arteries (ACA): Normal. --Middle cerebral arteries (MCA): Normal. ANATOMIC VARIANTS: Fetal origins of both posterior cerebral arteries. IMPRESSION: 1. Unchanged intraparenchymal hematoma of the right caudate head with intraventricular extension and mild communicating hydrocephalus. 2. Normal intracranial MRA. Electronically Signed   By: KUlyses JarredM.D.   On: 12/12/2020 20:50   MR BRAIN W WO CONTRAST  Result Date: 12/12/2020 CLINICAL DATA:  Intracranial hemorrhage. EXAM: MRI HEAD WITHOUT AND WITH CONTRAST MRA HEAD WITHOUT CONTRAST TECHNIQUE: Multiplanar, multi-echo pulse sequences of the brain and surrounding structures were acquired without and with intravenous contrast. Angiographic images of the Circle of Willis were acquired using MRA technique without intravenous contrast. CONTRAST:  981mGADAVIST GADOBUTROL 1 MMOL/ML IV SOLN COMPARISON:  Head CT 12/11/2020 FINDINGS: MRI HEAD FINDINGS Brain: Unchanged intraparenchymal hematoma of the right caudate head with intraventricular extension. 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 No acute infarct. There is multifocal hyperintense T2-weighted signal within  the white matter. Generalized volume loss without a clear lobar predilection. There is no abnormal contrast enhancement. Vascular: Major flow voids are  preserved. Skull and upper cervical spine: Normal calvarium and skull base. Visualized upper cervical spine and soft tissues are normal. Sinuses/Orbits:No paranasal sinus fluid levels or advanced mucosal thickening. No mastoid or middle ear effusion. Normal orbits. MRA HEAD FINDINGS POSTERIOR CIRCULATION: --Vertebral arteries: Normal --Inferior cerebellar arteries: Normal. --Basilar artery: Normal. --Superior cerebellar arteries: Normal. --Posterior cerebral arteries: Normal. Both are predominantly supplied by the posterior communicating arteries (p-comm). ANTERIOR CIRCULATION: --Intracranial internal carotid arteries: Normal. --Anterior cerebral arteries (ACA): Normal. --Middle cerebral arteries (MCA): Normal. ANATOMIC VARIANTS: Fetal origins of both posterior cerebral arteries. IMPRESSION: 1. Unchanged intraparenchymal hematoma of the right caudate head with intraventricular extension and mild communicating hydrocephalus. 2. Normal intracranial MRA. Electronically Signed   By: Ulyses Jarred M.D.   On: 12/12/2020 20:50   DG CHEST PORT 1 VIEW  Result Date: 12/15/2020 CLINICAL DATA:  Headache.  Bradycardia. EXAM: PORTABLE CHEST 1 VIEW COMPARISON:  02/19/2020 FINDINGS: The heart size and mediastinal contours are within normal limits. Both lungs are clear. The visualized skeletal structures are unremarkable. IMPRESSION: No active disease. Electronically Signed   By: Lucienne Capers M.D.   On: 12/15/2020 22:16   EEG adult  Result Date: 12/14/2020 Lora Havens, MD     12/15/2020  7:07 AM Patient Name: Matthew Francis MRN: [id number] Epilepsy Attending: Lora Havens Referring Physician/Provider: Dr Consuella Lose Date: 12/14/2020 Duration: 24.46 mins Patient history: 60 y.o. male with basal ganglia hemorrhage with intraventricular extension who has been neurologically well, with what appears to be episodic confusion and somnolence. EEG to evaluate for seizure  Level of alertness: Awake  AEDs during EEG study:  None Technical aspects: This EEG study was done with scalp electrodes positioned according to the 10-20 International system of electrode placement. Electrical activity was acquired at a sampling rate of 500Hz  and reviewed with a high frequency filter of 70Hz  and a low frequency filter of 1Hz . EEG data were recorded continuously and digitally stored. Description: The posterior dominant rhythm consists of 8 Hz activity of moderate voltage (25-35 uV) seen predominantly in posterior head regions, symmetric and reactive to eye opening and eye closing. EEG showed continuous 3 to 6 Hz theta-delta slowing in right hemisphere, maximal temporal region. Hyperventilation and photic stimulation were not performed.   ABNORMALITY - Continuous slow, right hemisphere, maximal temporal region IMPRESSION: This study is suggestive of cortical dysfunction arising from right hemisphere, maximal right temporal region, likely secondary to underlying structural abnormality/bleed. No seizures or epileptiform discharges were seen throughout the recording. Priyanka Barbra Sarks   Overnight EEG with video  Result Date: 12/15/2020 Lora Havens, MD     12/16/2020  9:18 AM Patient Name: KOBYN KRAY MRN: [id number] Epilepsy Attending: Lora Havens Referring Physician/Provider: Dr Consuella Lose Duration: 12/14/2020 1932 to 12/15/2020 1244  Patient history: 60 y.o. male with basal ganglia hemorrhage with intraventricular extension who has been neurologically well, with what appears to be episodic confusion and somnolence. EEG to evaluate for seizure  Level of alertness: Awake  AEDs during EEG study: None  Technical aspects: This EEG study was done with scalp electrodes positioned according to the 10-20 International system of electrode placement. Electrical activity was acquired at a sampling rate of 500Hz  and reviewed with a high frequency filter of 70Hz  and a low frequency filter of 1Hz . EEG data were recorded continuously and digitally  stored.  Description: The posterior dominant rhythm consists of 8 Hz activity of moderate voltage (25-35 uV) seen predominantly in posterior head regions, symmetric and reactive to eye opening and eye closing. EEG initially showed continuous 3 to 6 Hz theta-delta slowing in right hemisphere, maximal temporal region which gradually improved to intermittent 3-5hz  theta-delta slowing in right hemisphere, maximal temporal region. Hyperventilation and photic stimulation were not performed.    ABNORMALITY - Continuous slow, right hemisphere, maximal temporal region  IMPRESSION: This study is suggestive of cortical dysfunction arising from right hemisphere, maximal right temporal region, likely secondary to underlying structural abnormality/bleed. There was gradual improvement in slowing which could indicate that it was post-ictal slowing as well.  No seizures or epileptiform discharges were seen throughout the recording.  Lora Havens   ECHOCARDIOGRAM COMPLETE  Result Date: 12/12/2020    ECHOCARDIOGRAM REPORT   Patient Name:   DEON DUER Date of Exam: 12/12/2020 Medical Rec #:  [id number]     Height:       70.5 in Accession #:    [id number]    Weight:       238.1 lb Date of Birth:  1961-03-14     BSA:          2.260 m Patient Age:    56 years      BP:           138/94 mmHg Patient Gender: M             HR:           95 bpm. Exam Location:  Inpatient Procedure: 2D Echo, Color Doppler and Cardiac Doppler Indications:    Stroke I63.9  History:        Patient has no prior history of Echocardiogram examinations.                 Risk Factors:Dyslipidemia and Hypertension.  Sonographer:    Bernadene Person RDCS Referring Phys: [id number] [place]  1. Left ventricular ejection fraction, by estimation, is 60 to 65%. The left ventricle has normal function. The left ventricle has no regional wall motion abnormalities. Left ventricular diastolic parameters are consistent with Grade I diastolic dysfunction  (impaired relaxation).  2. Right ventricular systolic function is normal. The right ventricular size is normal. There is normal pulmonary artery systolic pressure. The estimated right ventricular systolic pressure is 55.7 mmHg.  3. The mitral valve is normal in structure. Trivial mitral valve regurgitation. No evidence of mitral stenosis.  4. The aortic valve is normal in structure. Aortic valve regurgitation is not visualized. No aortic stenosis is present.  5. The inferior vena cava is normal in size with greater than 50% respiratory variability, suggesting right atrial pressure of 3 mmHg. Conclusion(s)/Recommendation(s): No intracardiac source of embolism detected on this transthoracic study. A transesophageal echocardiogram is recommended to exclude cardiac source of embolism if clinically indicated. FINDINGS  Left Ventricle: Left ventricular ejection fraction, by estimation, is 60 to 65%. The left ventricle has normal function. The left ventricle has no regional wall motion abnormalities. The left ventricular internal cavity size was normal in size. There is  borderline left ventricular hypertrophy. Left ventricular diastolic parameters are consistent with Grade I diastolic dysfunction (impaired relaxation). Right Ventricle: The right ventricular size is normal. No increase in right ventricular wall thickness. Right ventricular systolic function is normal. There is normal pulmonary artery systolic pressure. The tricuspid regurgitant velocity is 2.04 m/s, and  with an assumed right atrial pressure of 3  mmHg, the estimated right ventricular systolic pressure is 93.5 mmHg. Left Atrium: Left atrial size was normal in size. Right Atrium: Right atrial size was normal in size. Pericardium: There is no evidence of pericardial effusion. Mitral Valve: The mitral valve is normal in structure. Trivial mitral valve regurgitation. No evidence of mitral valve stenosis. Tricuspid Valve: The tricuspid valve is normal in  structure. Tricuspid valve regurgitation is not demonstrated. No evidence of tricuspid stenosis. Aortic Valve: The aortic valve is normal in structure. Aortic valve regurgitation is not visualized. No aortic stenosis is present. Pulmonic Valve: The pulmonic valve was normal in structure. Pulmonic valve regurgitation is mild. No evidence of pulmonic stenosis. Aorta: The aortic root is normal in size and structure. Venous: The inferior vena cava is normal in size with greater than 50% respiratory variability, suggesting right atrial pressure of 3 mmHg. IAS/Shunts: No atrial level shunt detected by color flow Doppler.  LEFT VENTRICLE PLAX 2D LVIDd:         4.20 cm  Diastology LVIDs:         2.40 cm  LV e' medial:    5.78 cm/s LV PW:         1.10 cm  LV E/e' medial:  9.8 LV IVS:        0.90 cm  LV e' lateral:   10.20 cm/s LVOT diam:     2.00 cm  LV E/e' lateral: 5.5 LV SV:         65 LV SV Index:   29 LVOT Area:     3.14 cm  RIGHT VENTRICLE RV S prime:     14.30 cm/s TAPSE (M-mode): 1.6 cm LEFT ATRIUM             Index       RIGHT ATRIUM           Index LA diam:        3.50 cm 1.55 cm/m  RA Area:     13.90 cm LA Vol (A2C):   51.3 ml 22.70 ml/m RA Volume:   35.30 ml  15.62 ml/m LA Vol (A4C):   57.2 ml 25.31 ml/m LA Biplane Vol: 59.3 ml 26.24 ml/m  AORTIC VALVE LVOT Vmax:   124.00 cm/s LVOT Vmean:  85.300 cm/s LVOT VTI:    0.206 m  AORTA Ao Root diam: 3.30 cm Ao Asc diam:  3.30 cm MITRAL VALVE               TRICUSPID VALVE MV Area (PHT): 2.26 cm    TR Peak grad:   16.6 mmHg MV Decel Time: 335 msec    TR Vmax:        204.00 cm/s MV E velocity: 56.40 cm/s MV A velocity: 62.90 cm/s  SHUNTS MV E/A ratio:  0.90        Systemic VTI:  0.21 m                            Systemic Diam: 2.00 cm Cherlynn Kaiser MD Electronically signed by Cherlynn Kaiser MD Signature Date/Time: 12/12/2020/4:46:34 PM    Final    VAS US CAROTID  Result Date: 12/13/2020 Carotid Arterial Duplex Study Patient Name:  VIR WHETSTINE  Date of Exam:    12/13/2020 Medical Rec #: [id number]      Accession #:    [id number] Date of Birth: [birth year]      Patient Gender: M Patient Age:   72Y Exam  Location:  [facility] Procedure:      VAS US CAROTID Referring Phys: [id number] Rosalin Hawking --------------------------------------------------------------------------------  Indications:       CVA. Risk Factors:      Hypertension, hyperlipidemia. Comparison Study:  no prior Performing Technologist: Archie Patten RVS  Examination Guidelines: A complete evaluation includes B-mode imaging, spectral Doppler, color Doppler, and power Doppler as needed of all accessible portions of each vessel. Bilateral testing is considered an integral part of a complete examination. Limited examinations for reoccurring indications may be performed as noted.  Right Carotid Findings: +----------+--------+--------+--------+------------------+--------+           PSV cm/sEDV cm/sStenosisPlaque DescriptionComments +----------+--------+--------+--------+------------------+--------+ CCA Prox  77      9               heterogenous               +----------+--------+--------+--------+------------------+--------+ CCA Distal65      14              heterogenous               +----------+--------+--------+--------+------------------+--------+ ICA Prox  39      13      1-39%   heterogenous               +----------+--------+--------+--------+------------------+--------+ ICA Distal37      14                                         +----------+--------+--------+--------+------------------+--------+ ECA       69      11                                         +----------+--------+--------+--------+------------------+--------+ +----------+--------+-------+--------+-------------------+           PSV cm/sEDV cmsDescribeArm Pressure (mmHG) +----------+--------+-------+--------+-------------------+ [id number]                                          +----------+--------+-------+--------+-------------------+ +---------+--------+--+--------+-+---------+ VertebralPSV cm/s31EDV cm/s9Antegrade +---------+--------+--+--------+-+---------+  Left Carotid Findings: +----------+--------+--------+--------+------------------+--------+           PSV cm/sEDV cm/sStenosisPlaque DescriptionComments +----------+--------+--------+--------+------------------+--------+ CCA Prox  118     22              heterogenous               +----------+--------+--------+--------+------------------+--------+ CCA Distal63      21              heterogenous               +----------+--------+--------+--------+------------------+--------+ ICA Prox  53      22      1-39%   heterogenous               +----------+--------+--------+--------+------------------+--------+ ICA Distal65      26                                         +----------+--------+--------+--------+------------------+--------+ ECA       73      18                                         +----------+--------+--------+--------+------------------+--------+ +----------+--------+--------+--------+-------------------+  PSV cm/sEDV cm/sDescribeArm Pressure (mmHG) +----------+--------+--------+--------+-------------------+ [id number]                                          +----------+--------+--------+--------+-------------------+ +---------+--------+--+--------+-+---------+ VertebralPSV cm/s32EDV cm/s9Antegrade +---------+--------+--+--------+-+---------+   Summary: Right Carotid: Velocities in the right ICA are consistent with a 1-39% stenosis. Left Carotid: Velocities in the left ICA are consistent with a 1-39% stenosis. Vertebrals: Bilateral vertebral arteries demonstrate antegrade flow. *See table(s) above for measurements and observations.     Preliminary      PHYSICAL EXAM  Temp:  [98.7 F (37.1 C)-100.5 F (38.1 C)] 99.3 F (37.4 C) (07/03  0800) Pulse Rate:  [43-219] 66 (07/03 0830) Resp:  [14-32] 21 (07/03 0830) BP: (124-165)/(68-129) 140/90 (07/03 0830) SpO2:  [91 %-100 %] 96 % (07/03 0830) Weight:  [89.2 kg] 89.2 kg (07/02 1400)  General - Well nourished, well developed, in moderate distress  Ophthalmologic - resists eye opening.  Cardiovascular - Regular rhythm and rate.  Mental Status -  Awake and orientated to person, place and time. Language including expression, naming, repetition, comprehension was intact. Fund of Knowledge was assessed and was intact.  Cranial Nerves II - XII - II - Visual field intact to threat. III, IV, VI - Extraocular movements intact. V - Facial sensation intact bilaterally. VII - Facial movement intact bilaterally. VIII - Hearing & vestibular intact bilaterally. X - did not cooperate. XI - Head turn intact. XII - Tongue protrusion intact.  Motor Strength - The patient's strength was normal in all extremities and pronator drift was absent.  Bulk was normal and fasciculations were absent.   Motor Tone - Muscle tone was assessed at the neck and appendages and was normal.  Reflexes - The patient's reflexes were symmetrical in all extremities and he had no pathological reflexes.  Sensory - Light touch, temperature/pinprick were assessed and were symmetrical.    Coordination - The patient had normal movements in the hands with no ataxia or dysmetria.  Tremor was absent.  Gait and Station - deferred.   ASSESSMENT/PLAN Mr. BEREL NAJJAR is a 60 y.o. male with history of ulcerative colitis, HTN, HLD, DM, CKD, lone A. fib admitted for headache, confusion, nausea vomiting. No tPA given due to [place]. Overnight 12/14/2020, more confused and STAT CT head showed stable hemorrhage and  increase in ventricular size including temporal horns. Neurosurgery evaluated and determined hydrocephalus would not cause episodic symptoms and EEG did not show epileptiform discharges. Repeat CT head 12/15/2020  showed Improved hydrocephalus. He is currently AAOx3.  ICH:  right caudate head ICH with IVH likely secondary to hypertensive emergency and small vessel disease source CT right caudate head ICH with IVH. CT head 12/15/2020 stable hematoma and stable ventricular size MRI with and without contrast unchanged hematoma, no underlying mass MRA unremarkable, no aneurysm or AVM Carotid Doppler unremarkable 2D Echo EF 60 to 65% LDL 166 HgbA1c 5.9 SCDs for VTE prophylaxis No antithrombotic prior to admission, now on No antithrombotic given ICH Patient counseled to be compliant with his antithrombotic medications Ongoing aggressive stroke risk factor management Therapy recommendations:  none Disposition:  pending   IVH CT showed IVH right lateral ventricle, third ventricle and partial fourth ventricle Overnight 12/14/2020, more confused, STAT CT head stable hemorrhage and mild hydrocephalus. Neurosurgery opted no EVD. Repeat CT head 12/15/2020 showed Improved hydrocephalus. LTM EEG did not reveal epileptiform activity. CT repeat 7/1  some improvement of III ventricle Improved HA which was likely related to IVH and blood products  Short term oxycodone PRN - Hold. Continue to monitor.  Ulcerative colitis Frequent diarrhea Follows with GI at [facility] Recently prescribed Xifaxan but not able to afford the cost Restart Xifaxan this time as inpt On loperamide  Diabetes HgbA1c 5.9, goal < 7.0 Controlled CBG monitoring SSI DM education and close PCP follow up  Hypertension Home meds: Hydralazine, Norvasc and atenolol BP goal less than 160 On hydralazine 100 bid and Norvasc Long term BP goal normotensive  Hyperlipidemia Home meds: Crestor 10 every other day and Zetia LDL 166, goal < 70 Now on Crestor 20 daily Continue statin and Zetia at discharge  Cocaine abuse UDS positive for cocaine Avoid selective beta-blockers including metoprolol, atenolol Cessation education provided Patient is  willing to quit  AKI creatinine 1.31-> 1.17->1.50->1.39 likely due to dehydration from diarrhea, which is better today Encourage po intake Management per primary team  Other Stroke Risk Factors Obesity  Cocaine abuse  Other Active Problems Lone A. fib in 01/2020 in the setting of sepsis Brief episode of asymptomatic bradycardia and remained alert with BP, RR, Oxygen saturation within normal reference range. Currently stable and will need follow up with cardiology.  Hx of opioids abuse.  Continues to c/o severe lower back spasms which radiate into lower extremities causing severe pain. Increased baclofen to 57m three times daily and will consider magnesium 8064mdaily.   Discussed with critical care team and the patient is stable and ready to transfer to intermediate care on to hospitalist service. Transfer order placed.  Hospital day # 5  HuLynnae SandhoffMD Stroke Neurology Page: [phone]/08/2020 9:19 AM  To contact Stroke Continuity provider, please refer to [email or web address], contact General Neurology

## 2020-12-17 ENCOUNTER — Other Ambulatory Visit: Payer: Self-pay

## 2020-12-17 ENCOUNTER — Encounter (HOSPITAL_COMMUNITY): Payer: Self-pay | Admitting: Neurology

## 2020-12-17 ENCOUNTER — Inpatient Hospital Stay (HOSPITAL_COMMUNITY): Payer: 59

## 2020-12-17 LAB — COMPREHENSIVE METABOLIC PANEL
ALT: 75 U/L — ABNORMAL HIGH (ref 0–44)
AST: 46 U/L — ABNORMAL HIGH (ref 15–41)
Albumin: 4.2 g/dL (ref 3.5–5.0)
Alkaline Phosphatase: 86 U/L (ref 38–126)
Anion gap: 13 (ref 5–15)
BUN: 10 mg/dL (ref 6–20)
CO2: 20 mmol/L — ABNORMAL LOW (ref 22–32)
Calcium: 10.1 mg/dL (ref 8.9–10.3)
Chloride: 100 mmol/L (ref 98–111)
Creatinine, Ser: 1.49 mg/dL — ABNORMAL HIGH (ref 0.61–1.24)
GFR, Estimated: 54 mL/min — ABNORMAL LOW (ref >60)
Glucose, Bld: 181 mg/dL — ABNORMAL HIGH (ref 70–99)
Potassium: 3.2 mmol/L — ABNORMAL LOW (ref 3.5–5.1)
Sodium: 133 mmol/L — ABNORMAL LOW (ref 135–145)
Total Bilirubin: 1.2 mg/dL (ref 0.3–1.2)
Total Protein: 7.8 g/dL (ref 6.5–8.1)

## 2020-12-17 LAB — MAGNESIUM: Magnesium: 2.1 mg/dL (ref 1.7–2.4)

## 2020-12-17 LAB — CBC WITH DIFFERENTIAL/PLATELET
Abs Immature Granulocytes: 0.01 10*3/uL (ref 0.00–0.07)
Basophils Absolute: 0.1 10*3/uL (ref 0.0–0.1)
Basophils Relative: 1 %
Eosinophils Absolute: 0.1 10*3/uL (ref 0.0–0.5)
Eosinophils Relative: 2 %
HCT: 44 % (ref 39.0–52.0)
Hemoglobin: 14.8 g/dL (ref 13.0–17.0)
Immature Granulocytes: 0 %
Lymphocytes Relative: 22 %
Lymphs Abs: 1.7 10*3/uL (ref 0.7–4.0)
MCH: 30.6 pg (ref 26.0–34.0)
MCHC: 33.6 g/dL (ref 30.0–36.0)
MCV: 91.1 fL (ref 80.0–100.0)
Monocytes Absolute: 0.7 10*3/uL (ref 0.1–1.0)
Monocytes Relative: 9 %
Neutro Abs: 5.5 10*3/uL (ref 1.7–7.7)
Neutrophils Relative %: 66 %
Platelets: 211 10*3/uL (ref 150–400)
RBC: 4.83 MIL/uL (ref 4.22–5.81)
RDW: 14.1 % (ref 11.5–15.5)
WBC: 8.1 10*3/uL (ref 4.0–10.5)
nRBC: 0 % (ref 0.0–0.2)

## 2020-12-17 LAB — GLUCOSE, CAPILLARY
Glucose-Capillary: 135 mg/dL — ABNORMAL HIGH (ref 70–99)
Glucose-Capillary: 133 mg/dL — ABNORMAL HIGH (ref 70–99)
Glucose-Capillary: 122 mg/dL — ABNORMAL HIGH (ref 70–99)
Glucose-Capillary: 167 mg/dL — ABNORMAL HIGH (ref 70–99)

## 2020-12-17 LAB — CBC
HCT: 44.9 % (ref 39.0–52.0)
Hemoglobin: 15.2 g/dL (ref 13.0–17.0)
MCH: 30.5 pg (ref 26.0–34.0)
MCHC: 33.9 g/dL (ref 30.0–36.0)
MCV: 90.2 fL (ref 80.0–100.0)
Platelets: 196 10*3/uL (ref 150–400)
RBC: 4.98 MIL/uL (ref 4.22–5.81)
RDW: 14.2 % (ref 11.5–15.5)
WBC: 8.4 10*3/uL (ref 4.0–10.5)
nRBC: 0 % (ref 0.0–0.2)

## 2020-12-17 LAB — BASIC METABOLIC PANEL
Anion gap: 7 (ref 5–15)
BUN: 9 mg/dL (ref 6–20)
CO2: 26 mmol/L (ref 22–32)
Calcium: 9.9 mg/dL (ref 8.9–10.3)
Chloride: 101 mmol/L (ref 98–111)
Creatinine, Ser: 1.18 mg/dL (ref 0.61–1.24)
GFR, Estimated: >60 mL/min (ref >60)
Glucose, Bld: 139 mg/dL — ABNORMAL HIGH (ref 70–99)
Potassium: 4 mmol/L (ref 3.5–5.1)
Sodium: 134 mmol/L — ABNORMAL LOW (ref 135–145)

## 2020-12-17 LAB — AMMONIA: Ammonia: 39 umol/L — ABNORMAL HIGH (ref 9–35)

## 2020-12-17 LAB — VITAMIN B12: Vitamin B-12: >7500 pg/mL — ABNORMAL HIGH (ref 180–914)

## 2020-12-17 LAB — T4, FREE: Free T4: 1.29 ng/dL — ABNORMAL HIGH (ref 0.61–1.12)

## 2020-12-17 MED ORDER — CLONIDINE HCL 0.1 MG/24HR TD PTWK: 0.1000 mg | MEDICATED_PATCH | TRANSDERMAL | Status: DC

## 2020-12-17 MED ORDER — SODIUM CHLORIDE 0.9 % IV SOLN: INTRAVENOUS | Status: AC

## 2020-12-17 MED ORDER — QUETIAPINE FUMARATE 25 MG PO TABS
12.5000 mg | ORAL_TABLET | Freq: Two times a day (BID) | ORAL | Status: DC
  Administered 2020-12-17 (×2): 12.5 mg via ORAL
  Filled 2020-12-17 (×3): qty 1

## 2020-12-17 MED ORDER — POTASSIUM CHLORIDE CRYS ER 20 MEQ PO TBCR
40.0000 meq | EXTENDED_RELEASE_TABLET | Freq: Once | ORAL | Status: AC
  Administered 2020-12-17: 40 meq via ORAL
  Filled 2020-12-17: qty 2

## 2020-12-17 MED ORDER — GABAPENTIN 100 MG PO CAPS
100.0000 mg | ORAL_CAPSULE | Freq: Three times a day (TID) | ORAL | Status: DC
  Administered 2020-12-17 – 2020-12-18 (×4): 100 mg via ORAL
  Filled 2020-12-17 (×4): qty 1

## 2020-12-17 MED ORDER — METHOCARBAMOL 500 MG PO TABS
500.0000 mg | ORAL_TABLET | Freq: Three times a day (TID) | ORAL | Status: DC | PRN
  Administered 2020-12-17 – 2020-12-21 (×5): 500 mg via ORAL
  Filled 2020-12-17 (×5): qty 1

## 2020-12-17 MED ORDER — LACTATED RINGERS IV SOLN: INTRAVENOUS | Status: DC

## 2020-12-17 MED ORDER — HYDRALAZINE HCL 20 MG/ML IJ SOLN: 10.0000 mg | INTRAMUSCULAR | Status: DC | PRN

## 2020-12-17 MED ORDER — CLONIDINE HCL 0.1 MG PO TABS: 0.1000 mg | ORAL_TABLET | Freq: Every day | ORAL | Status: DC

## 2020-12-17 MED ORDER — CLONIDINE HCL 0.1 MG PO TABS
0.1000 mg | ORAL_TABLET | Freq: Every day | ORAL | Status: DC
  Administered 2020-12-17 – 2020-12-20 (×4): 0.1 mg via ORAL
  Filled 2020-12-17 (×4): qty 1

## 2020-12-17 MED ORDER — QUETIAPINE FUMARATE 25 MG PO TABS: 25.0000 mg | ORAL_TABLET | Freq: Every day | ORAL | Status: DC

## 2020-12-17 NOTE — Progress Notes (Signed)
[facility] Progress Note  Patient: Matthew Francis    [id number]  DOA: 12/11/2020     Date of Service: the patient was seen and examined on 12/17/2020  Brief hospital course: Past medical history of HTN, HLD, arthritis, GERD, headache.  Presents with complaints of confusion.  Found to have hemorrhagic stroke. Patient was admitted to the ICU under neurology service. PCCM was consulted for further management. Transferred to hospitalist service on 7/4. Currently plan is treat agitation and monitor blood pressure.  Subjective: Denies any acute complaint.  No nausea no vomiting.  Was agitated last night.  Agitation appears to be improving.  No diarrhea no constipation.  Assessment and Plan: 1.  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 CT head shows a stable hematoma on 7/4. MRI and MRA unremarkable without any aneurysm. Carotid Doppler unremarkable. Echocardiogram shows preserved EF 60 to 65%. Currently continue SCD for VT prophylaxis. No antithrombotic. Controlled blood pressure.  Goal blood pressure normal. EEG long-term did not reveal any seizure-like activity. Repeat CT scan actually shows improved hydrocephalus.  2.  Acute metabolic encephalopathy Likely multifactorial. Suspect that patient was recently started on baclofen for his back pain which I think is causing his further agitation. Having to discontinue this medication.  Placed on Seroquel. Switch to Robaxin for pain control.   3.  Malignant hypertension Cocaine abuse Avoid beta-blockers. Currently on Norvasc, clonidine, hydralazine,  4.  Acute kidney injury Renal function mildly worsening. Improved with IV fluids.  Monitor.  5.  Ulcerative colitis history Continue home regimen for now  6.  Type 2 diabetes mellitus, controlled.  No complication.  No long-term insulin use. Hemoglobin A1c  5.9. Continue sliding scale insulin.  7.  HLD. Will continue Crestor.  8.  Cocaine abuse. Will counsel the patient to stop abusing. Currently patient encephalopathic.  Scheduled Meds:  amLODipine  10 mg Oral Daily   budesonide  9 mg Oral Daily   cloNIDine  0.1 mg Oral Daily   gabapentin  100 mg Oral TID   hydrALAZINE  100 mg Oral BID   insulin aspart  0-9 Units Subcutaneous TID AC & HS   multivitamin with minerals  1 tablet Oral Daily   pantoprazole  40 mg Oral Daily   QUEtiapine  12.5 mg Oral BID   rifaximin  550 mg Oral TID   vitamin B-12  100 mcg Oral Daily   Vitamin D (Ergocalciferol)  50,000 Units Oral Q Wed   Continuous Infusions:  sodium chloride 100 mL/hr at 12/17/20 1628   PRN Meds: acetaminophen, butalbital-acetaminophen-caffeine, hydrALAZINE, methocarbamol, ondansetron (ZOFRAN) IV, white petrolatum  Body mass index is 27.43 kg/m.        DVT Prophylaxis:   SCD's Start: 12/12/20 0101    Advance goals of care discussion: Pt is Full code.  Family Communication: no family was present at bedside, at the time of interview.   Data Reviewed: I have personally reviewed and interpreted daily labs, tele strips, imaging. Mild hypokalemia.  Serum creatinine worsening from 1.18-1.49.  LFTs mildly elevated.  B12 significantly elevated.  Ammonia level minimally elevated.  Physical Exam:  General: Appear in mild distress, no Rash; Oral Mucosa Clear, moist. no Abnormal Neck Mass Or lumps, Conjunctiva normal  Cardiovascular: S1 and S2 Present, no Murmur, Respiratory: good respiratory effort, Bilateral Air entry present and CTA, no Crackles, no wheezes Abdomen: Bowel Sound present, Soft and no tenderness Extremities: no Pedal edema Neurology:  alert and oriented to place and person affect appropriate. no new focal deficit Gait not checked due to patient safety concerns  Vitals:   12/17/20 1300 12/17/20 1400 12/17/20 1500 12/17/20 1546  BP: 130/80 (!) 144/98 133/84  119/65  Pulse:    (!) 59  Resp: 14 17 16 14   Temp:    98.6 F (37 C)  TempSrc:    Oral  SpO2:    98%  Weight:      Height:        Disposition:  Status is: Inpatient  Remains inpatient appropriate because:Altered mental status and IV treatments appropriate due to intensity of illness or inability to take PO  Dispo: The patient is from: Home              Anticipated d/c is to: Home              Patient currently is not medically stable to d/c.   Difficult to place patient No  Time spent: 35 minutes. I reviewed all nursing notes, pharmacy notes, vitals, pertinent old records. I have discussed plan of care as described above with RN.  Author: Berle Mull, MD [facility] 12/17/2020 6:50 PM  To reach On-call, see care teams to locate the attending and reach out via [email or web address]. Between 7PM-7AM, please contact night-coverage If you still have difficulty reaching the attending provider, please page the [facility] (Director on Call) for [facility] on amion for assistance.

## 2020-12-17 NOTE — Progress Notes (Signed)
PT Cancellation Note  Patient Details Name: Matthew Francis MRN: [id number] DOB: [birth year]   Cancelled Treatment:    Reason Eval/Treat Not Completed: Fatigue/lethargy limiting ability to participate  Per RN pt has not slept for past 2 nights and is finally sleeping. Will attempt later today if schedule permits. Otherwise will evaluate 7/5.    Arby Barrette, PT Pager [phone]  Rexanne Mano 12/17/2020, 1:43 PM

## 2020-12-17 NOTE — Plan of Care (Signed)
  Problem: Education: Goal: Knowledge of disease or condition will improve Outcome: Progressing Goal: Knowledge of secondary prevention will improve Outcome: Progressing Goal: Knowledge of patient specific risk factors addressed and post discharge goals established will improve Outcome: Progressing   Problem: Coping: Goal: Will verbalize positive feelings about self Outcome: Progressing Goal: Will identify appropriate support needs Outcome: Progressing   Problem: Health Behavior/Discharge Planning: Goal: Ability to manage health-related needs will improve Outcome: Progressing   Problem: Self-Care: Goal: Ability to participate in self-care as condition permits will improve Outcome: Progressing   Problem: Intracerebral Hemorrhage Tissue Perfusion: Goal: Complications of Intracerebral Hemorrhage will be minimized Outcome: Progressing   Problem: Safety: Goal: Non-violent Restraint(s) Outcome: Progressing

## 2020-12-17 NOTE — Progress Notes (Signed)
eLink Physician-Brief Progress Note Patient Name: Matthew Francis DOB: [birth year] MRN: [id number]   Date of Service  12/17/2020  HPI/Events of Note  Agitation = Patient has gotten out of Posey belt twice. QTc interval = 0.48 seconds. Nursing request for bilateral soft wrist restraints.   eICU Interventions  Plan: Haldol 1 mg IV now. Bilateral soft wrist restraints X 3 hours.     Intervention Category Major Interventions: Delirium, psychosis, severe agitation - evaluation and management  [name] 12/17/2020, 5:48 AM

## 2020-12-17 NOTE — Progress Notes (Signed)
STROKE TEAM PROGRESS NOTE   SUBJECTIVE (INTERVAL HISTORY) No family is at the bedside.  Patient had brief confusion restlessness and combativeness this morning.  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   Blood pressure adequately controlled.  Vital signs are stable.  Labs from this morning are mostly unremarkable except for slightly low potassium and sodium    OBJECTIVE Temp:  [98 F (36.7 C)-99.5 F (37.5 C)] 99.5 F (37.5 C) (07/04 1100) Pulse Rate:  [44-116] 76 (07/04 0713) Resp:  [12-33] 24 (07/04 0713) BP: (118-187)/(68-115) 140/100 (07/04 0713) SpO2:  [91 %-99 %] 97 % (07/04 0713)  Recent Labs  Lab 12/15/20 2159 12/16/20 0821 12/16/20 2113 12/17/20 0742 12/17/20 1122  GLUCAP 126* 137* 125* 135* 133*   Recent Labs  Lab 12/13/20 0316 12/14/20 0350 12/15/20 0033 12/16/20 2358 12/17/20 0932  NA 140 140 135 134* 133*  K 3.9 3.9 4.0 4.0 3.2*  CL 107 108 103 101 100  CO2 25 23 21* 26 20*  GLUCOSE 100* 136* 120* 139* 181*  BUN 17 12 8 9 10   CREATININE 1.50* 1.39* 1.05 1.18 1.49*  CALCIUM 10.2 9.8 9.6 9.9 10.1  MG  --  2.5*  --  2.1  --   PHOS  --  2.7  --   --   --    Recent Labs  Lab 12/11/20 1900 12/14/20 0350 12/17/20 0932  AST 16 26 46*  ALT 15 20 75*  ALKPHOS 76 62 86  BILITOT 1.0 1.1 1.2  PROT 8.4* 7.5 7.8  ALBUMIN 4.5 4.1 4.2   Recent Labs  Lab 12/12/20 0212 12/13/20 0316 12/14/20 0350 12/15/20 0033 12/16/20 2358 12/17/20 0932  WBC 8.6 8.3 8.1 10.6* 8.1 8.4  NEUTROABS 7.8* 4.7 6.0 7.8* 5.5  --   HGB 13.9 13.5 13.8 13.7 14.8 15.2  HCT 42.7 41.3 43.3 42.5 44.0 44.9  MCV 93.4 94.3 95.2 93.6 91.1 90.2  PLT 215 207 212 195 211 196   No results for input(s): CKTOTAL, CKMB, CKMBINDEX, TROPONINI in the last 168 hours. No results for input(s): LABPROT, INR in the last 72 hours.  Recent Labs    12/16/20 0446   COLORURINE YELLOW  LABSPEC 1.011  PHURINE 6.0  GLUCOSEU NEGATIVE  HGBUR NEGATIVE  BILIRUBINUR NEGATIVE  KETONESUR NEGATIVE  PROTEINUR NEGATIVE  NITRITE NEGATIVE  LEUKOCYTESUR NEGATIVE        Component Value Date/Time   CHOL 268 (H) 12/12/2020 0212   TRIG 71 12/12/2020 0212   HDL 88 12/12/2020 0212   CHOLHDL 3.0 12/12/2020 0212   VLDL 14 12/12/2020 0212   LDLCALC 166 (H) 12/12/2020 0212   Lab Results  Component Value Date   HGBA1C 5.9 (H) 12/12/2020      Component Value Date/Time   LABOPIA NONE DETECTED 12/12/2020 0324   COCAINSCRNUR POSITIVE (A) 12/12/2020 0324   LABBENZ NONE DETECTED 12/12/2020 0324   AMPHETMU NONE DETECTED 12/12/2020 0324   THCU NONE DETECTED 12/12/2020 0324   LABBARB NONE DETECTED 12/12/2020 0324    No results for input(s): ETH in the last 168 hours.  I have personally reviewed the radiological images below and agree with the radiology interpretations.  CT HEAD WO CONTRAST  Result Date: 12/17/2020 CLINICAL DATA:  Intracranial hemorrhage follow EXAM: CT HEAD WITHOUT CONTRAST TECHNIQUE: Contiguous axial images were obtained from the base of the skull through the vertex without intravenous contrast. COMPARISON:  12/15/2020 FINDINGS: Brain: Unchanged intraparenchymal hemorrhage in the right caudate head extending into the ventricular system. Unchanged mild communicating hydrocephalus. No new site of hemorrhage. Vascular: No hyperdense vessel or unexpected calcification. Skull: Normal. Negative for fracture or focal lesion. Sinuses/Orbits: No acute finding. Other: None IMPRESSION: 1. Unchanged intraparenchymal hemorrhage in the right caudate head extending into the ventricular system. 2. Unchanged mild communicating hydrocephalus. Electronically Signed   By: Ulyses Jarred M.D.   On: 12/17/2020 01:18   CT HEAD WO CONTRAST  Result Date: 12/15/2020 CLINICAL DATA:  Intracranial hemorrhage follow-up EXAM: CT HEAD WITHOUT CONTRAST TECHNIQUE: Contiguous axial  images were obtained from the base of the skull through the vertex without intravenous contrast. COMPARISON:  12/14/2020 FINDINGS: Brain: Persistent intraventricular extension of hemorrhage from the right caudate head hematoma. Hydrocephalus has improved. No new site of hemorrhage. Vascular: No abnormal hyperdensity of the major intracranial arteries or dural venous sinuses. No intracranial atherosclerosis. Skull: The visualized skull base, calvarium and extracranial soft tissues are normal. Sinuses/Orbits: No fluid levels or advanced mucosal thickening of the visualized paranasal sinuses. No mastoid or middle ear effusion. The orbits are normal. IMPRESSION: 1. Improved hydrocephalus. 2. Persistent intraventricular extension of hemorrhage from the right caudate head hematoma. Electronically Signed   By: Ulyses Jarred M.D.   On: 12/15/2020 02:22   CT HEAD WO CONTRAST  Result Date: 12/14/2020 CLINICAL DATA:  Follow-up intracranial hemorrhage EXAM: CT HEAD WITHOUT CONTRAST TECHNIQUE: Contiguous axial images were obtained from the base of the skull through the vertex without intravenous contrast. COMPARISON:  12/13/2020 FINDINGS: Brain: There again noted changes consistent with hemorrhage in the head of the caudate nucleus on the right. Intraventricular extension into the right lateral ventricle is noted. The thrombus is stable although there are changes of progressive dilatation in the lateral and third ventricles. Fourth ventricle appears within normal limits. No new hemorrhage is seen. Findings suggestive of increasing intracranial pressure are noted with decrease in the sulcal markings when compared with the previous day. Thrombus is noted within the third ventricle which appears to extend into cerebral aqueduct causing the dilatation. Vascular: No hyperdense vessel or unexpected calcification. Skull: Normal. Negative for fracture or focal lesion. Sinuses/Orbits: No acute finding. Other: None. IMPRESSION:  Persistent hemorrhage in the head of the caudate nucleus on the right with evidence of interventricular thrombus in the lateral ventricles bilaterally and third ventricle. There is interval dilatation of the lateral and third ventricles with findings of thrombus and apparent occlusion of the cerebral aqueduct related to change in the morphology of the third ventricular thrombus. Additionally signs of increased intracranial pressure are noted with decreased sulcal markings when compared with the previous day. Critical Value/emergent results were called by telephone at the time of interpretation on 12/14/2020 at 4:42 pm to Dr. Rosalin Hawking , who verbally acknowledged these results. Electronically Signed   By: [name] M.D.   On: 12/14/2020 16:43   CT HEAD WO CONTRAST  Result Date: 12/13/2020 CLINICAL DATA:  Worsening headache EXAM: CT HEAD WITHOUT CONTRAST TECHNIQUE: Contiguous axial images were obtained from the base of the skull through the vertex without intravenous contrast. COMPARISON:  12/11/2020 FINDINGS: Brain: Again seen is the right basal ganglia intracerebral hemorrhage with intraventricular extension. No significant change since prior study. Stable mild communicating hydrocephalus. No significant midline shift. Vascular: No hyperdense vessel or unexpected calcification. Skull: No acute calvarial abnormality. 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 No change since prior study.  Electronically Signed   By: Rolm Baptise M.D.   On: 12/13/2020 21:40   CT HEAD WO CONTRAST  Result Date: 12/12/2020 CLINICAL DATA:  Stroke follow-up.  Pupillary asymmetry. EXAM: CT HEAD WITHOUT CONTRAST TECHNIQUE: Contiguous axial images were obtained from the base of the skull through the vertex without intravenous contrast. COMPARISON:  Head CT 12/11/2020 at 7:28 p.m. FINDINGS: Brain: Unchanged appearance of intraparenchymal hematoma  centered at the right caudate head. There is extension of hemorrhage into the ventricles with mild communicating hydrocephalus. There is no midline shift. No new site of hemorrhage. Vascular: No abnormal hyperdensity of the major intracranial arteries or dural venous sinuses. No intracranial atherosclerosis. Skull: The visualized skull base, calvarium and extracranial soft tissues are normal. Sinuses/Orbits: No fluid levels or advanced mucosal thickening of the visualized paranasal sinuses. No mastoid or middle ear effusion. The orbits are normal. IMPRESSION: Unchanged appearance of intraparenchymal hematoma centered at the right caudate head with intraventricular extension and mild communicating hydrocephalus. Electronically Signed   By: Ulyses Jarred M.D.   On: 12/12/2020 00:10   CT Head Wo Contrast  Result Date: 12/11/2020 CLINICAL DATA:  Headache EXAM: CT HEAD WITHOUT CONTRAST TECHNIQUE: Contiguous axial images were obtained from the base of the skull through the vertex without intravenous contrast. COMPARISON:  CT brain 02/19/2020 FINDINGS: Brain: 19 x 20 mm acute hemorrhage within the right caudate/basal ganglia. Moderate acute hemorrhage within the right lateral ventricle with additional hemorrhage in the third and fourth ventricles. The ventricles are mildly enlarged. No midline shift. Mild chronic small vessel ischemic changes of the white matter. Vascular: No hyperdense vessels.  No unexpected calcification Skull: Normal. Negative for fracture or focal lesion. Sinuses/Orbits: No acute finding. Other: None IMPRESSION: 1. Acute hemorrhage within the right caudate/basal ganglia with hemorrhage in the right lateral, third, and fourth ventricles. No midline shift. The ventricles are slightly dilated as compared with prior CT from 2021. 2. Mild chronic small vessel ischemic change of the white matter. Critical Value/emergent results were called by telephone at the time of interpretation on 12/11/2020 at 7:45  pm to provider ADAM CURATOLO , who verbally acknowledged these results. Electronically Signed   By: Donavan Foil M.D.   On: 12/11/2020 19:50   MR MRA HEAD WO CONTRAST  Result Date: 12/12/2020 CLINICAL DATA:  Intracranial hemorrhage. EXAM: MRI HEAD WITHOUT AND WITH CONTRAST MRA HEAD WITHOUT CONTRAST TECHNIQUE: Multiplanar, multi-echo pulse sequences of the brain and surrounding structures were acquired without and with intravenous contrast. Angiographic images of the Circle of Willis were acquired using MRA technique without intravenous contrast. CONTRAST:  41m GADAVIST GADOBUTROL 1 MMOL/ML IV SOLN COMPARISON:  Head CT 12/11/2020 FINDINGS: MRI HEAD FINDINGS Brain: Unchanged intraparenchymal hematoma of the right caudate head with intraventricular extension. 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 No acute infarct. There is multifocal hyperintense T2-weighted signal within the white matter. Generalized volume loss without a clear lobar predilection. There is no abnormal contrast enhancement. Vascular: Major flow voids are preserved. Skull and upper cervical spine: Normal calvarium and skull base. Visualized upper cervical spine and soft tissues are normal. Sinuses/Orbits:No paranasal sinus fluid levels or advanced mucosal thickening. No mastoid or middle ear effusion. Normal orbits. MRA HEAD FINDINGS POSTERIOR CIRCULATION: --Vertebral arteries: Normal --Inferior cerebellar arteries: Normal. --Basilar artery: Normal. --Superior cerebellar arteries: Normal. --Posterior cerebral arteries: Normal. Both are predominantly supplied by the posterior communicating arteries (p-comm). ANTERIOR CIRCULATION: --Intracranial internal carotid arteries: Normal. --Anterior cerebral arteries (ACA): Normal. --Middle cerebral arteries (MCA): Normal. ANATOMIC VARIANTS: Fetal origins  of both posterior cerebral arteries. IMPRESSION: 1. Unchanged intraparenchymal hematoma of the right caudate head with intraventricular extension  and mild communicating hydrocephalus. 2. Normal intracranial MRA. Electronically Signed   By: Ulyses Jarred M.D.   On: 12/12/2020 20:50   MR BRAIN W WO CONTRAST  Result Date: 12/12/2020 CLINICAL DATA:  Intracranial hemorrhage. EXAM: MRI HEAD WITHOUT AND WITH CONTRAST MRA HEAD WITHOUT CONTRAST TECHNIQUE: Multiplanar, multi-echo pulse sequences of the brain and surrounding structures were acquired without and with intravenous contrast. Angiographic images of the Circle of Willis were acquired using MRA technique without intravenous contrast. CONTRAST:  50m GADAVIST GADOBUTROL 1 MMOL/ML IV SOLN COMPARISON:  Head CT 12/11/2020 FINDINGS: MRI HEAD FINDINGS Brain: Unchanged intraparenchymal hematoma of the right caudate head with intraventricular extension. 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 No acute infarct. There is multifocal hyperintense T2-weighted signal within the white matter. Generalized volume loss without a clear lobar predilection. There is no abnormal contrast enhancement. Vascular: Major flow voids are preserved. Skull and upper cervical spine: Normal calvarium and skull base. Visualized upper cervical spine and soft tissues are normal. Sinuses/Orbits:No paranasal sinus fluid levels or advanced mucosal thickening. No mastoid or middle ear effusion. Normal orbits. MRA HEAD FINDINGS POSTERIOR CIRCULATION: --Vertebral arteries: Normal --Inferior cerebellar arteries: Normal. --Basilar artery: Normal. --Superior cerebellar arteries: Normal. --Posterior cerebral arteries: Normal. Both are predominantly supplied by the posterior communicating arteries (p-comm). ANTERIOR CIRCULATION: --Intracranial internal carotid arteries: Normal. --Anterior cerebral arteries (ACA): Normal. --Middle cerebral arteries (MCA): Normal. ANATOMIC VARIANTS: Fetal origins of both posterior cerebral arteries. IMPRESSION: 1. Unchanged intraparenchymal hematoma of the right caudate head with intraventricular extension and mild  communicating hydrocephalus. 2. Normal intracranial MRA. Electronically Signed   By: KUlyses JarredM.D.   On: 12/12/2020 20:50   DG CHEST PORT 1 VIEW  Result Date: 12/15/2020 CLINICAL DATA:  Headache.  Bradycardia. EXAM: PORTABLE CHEST 1 VIEW COMPARISON:  02/19/2020 FINDINGS: The heart size and mediastinal contours are within normal limits. Both lungs are clear. The visualized skeletal structures are unremarkable. IMPRESSION: No active disease. Electronically Signed   By: WLucienne CapersM.D.   On: 12/15/2020 22:16   EEG adult  Result Date: 12/14/2020 YLora Havens MD     12/15/2020  7:07 AM Patient Name: Matthew Francis: [id number] Attending: PLora HavensReferring Physician/Provider: Dr NConsuella LoseDate: 12/14/2020 Duration: 24.46 mins Patient history: 60y.o. male with basal ganglia hemorrhage with intraventricular extension who has been neurologically well, with what appears to be episodic confusion and somnolence. EEG to evaluate for seizure  Level of alertness: Awake  AEDs during EEG study: None Technical aspects: This EEG study was done with scalp electrodes positioned according to the 10-20 International system of electrode placement. Electrical activity was acquired at a sampling rate of 500Hz  and reviewed with a high frequency filter of 70Hz  and a low frequency filter of 1Hz . EEG data were recorded continuously and digitally stored. Description: The posterior dominant rhythm consists of 8 Hz activity of moderate voltage (25-35 uV) seen predominantly in posterior head regions, symmetric and reactive to eye opening and eye closing. EEG showed continuous 3 to 6 Hz theta-delta slowing in right hemisphere, maximal temporal region. Hyperventilation and photic stimulation were not performed.   ABNORMALITY - Continuous slow, right hemisphere, maximal temporal region IMPRESSION: This study is suggestive of cortical dysfunction arising from right hemisphere, maximal right temporal region,  likely secondary to underlying structural abnormality/bleed. No seizures or epileptiform discharges were seen throughout the recording. Priyanka O  Yadav   Overnight EEG with video  Result Date: 12/15/2020 Lora Havens, MD     12/16/2020  9:18 AM Patient Name: Matthew Francis MRN: [id number] Epilepsy Attending: Lora Havens Referring Physician/Provider: Dr Consuella Lose Duration: 12/14/2020 1932 to 12/15/2020 1244  Patient history: 60 y.o. male with basal ganglia hemorrhage with intraventricular extension who has been neurologically well, with what appears to be episodic confusion and somnolence. EEG to evaluate for seizure  Level of alertness: Awake  AEDs during EEG study: None  Technical aspects: This EEG study was done with scalp electrodes positioned according to the 10-20 International system of electrode placement. Electrical activity was acquired at a sampling rate of 500Hz  and reviewed with a high frequency filter of 70Hz  and a low frequency filter of 1Hz . EEG data were recorded continuously and digitally stored.  Description: The posterior dominant rhythm consists of 8 Hz activity of moderate voltage (25-35 uV) seen predominantly in posterior head regions, symmetric and reactive to eye opening and eye closing. EEG initially showed continuous 3 to 6 Hz theta-delta slowing in right hemisphere, maximal temporal region which gradually improved to intermittent 3-5hz  theta-delta slowing in right hemisphere, maximal temporal region. Hyperventilation and photic stimulation were not performed.    ABNORMALITY - Continuous slow, right hemisphere, maximal temporal region  IMPRESSION: This study is suggestive of cortical dysfunction arising from right hemisphere, maximal right temporal region, likely secondary to underlying structural abnormality/bleed. There was gradual improvement in slowing which could indicate that it was post-ictal slowing as well.  No seizures or epileptiform discharges were seen throughout  the recording.  Lora Havens   ECHOCARDIOGRAM COMPLETE  Result Date: 12/12/2020    ECHOCARDIOGRAM REPORT   Patient Name:   Matthew Francis Date of Exam: 12/12/2020 Medical Rec #:  [id number]     Height:       70.5 in Accession #:    [id number]    Weight:       238.1 lb Date of Birth:  1960-09-24     BSA:          2.260 m Patient Age:    60 years      BP:           138/94 mmHg Patient Gender: M             HR:           95 bpm. Exam Location:  Inpatient Procedure: 2D Echo, Color Doppler and Cardiac Doppler Indications:    Stroke I63.9  History:        Patient has no prior history of Echocardiogram examinations.                 Risk Factors:Dyslipidemia and Hypertension.  Sonographer:    Bernadene Person RDCS Referring Phys: [id number] [place]  1. Left ventricular ejection fraction, by estimation, is 60 to 65%. The left ventricle has normal function. The left ventricle has no regional wall motion abnormalities. Left ventricular diastolic parameters are consistent with Grade I diastolic dysfunction (impaired relaxation).  2. Right ventricular systolic function is normal. The right ventricular size is normal. There is normal pulmonary artery systolic pressure. The estimated right ventricular systolic pressure is 76.7 mmHg.  3. The mitral valve is normal in structure. Trivial mitral valve regurgitation. No evidence of mitral stenosis.  4. The aortic valve is normal in structure. Aortic valve regurgitation is not visualized. No aortic stenosis is present.  5. The inferior vena cava is  normal in size with greater than 50% respiratory variability, suggesting right atrial pressure of 3 mmHg. Conclusion(s)/Recommendation(s): No intracardiac source of embolism detected on this transthoracic study. A transesophageal echocardiogram is recommended to exclude cardiac source of embolism if clinically indicated. FINDINGS  Left Ventricle: Left ventricular ejection fraction, by estimation, is 60 to 65%. The left  ventricle has normal function. The left ventricle has no regional wall motion abnormalities. The left ventricular internal cavity size was normal in size. There is  borderline left ventricular hypertrophy. Left ventricular diastolic parameters are consistent with Grade I diastolic dysfunction (impaired relaxation). Right Ventricle: The right ventricular size is normal. No increase in right ventricular wall thickness. Right ventricular systolic function is normal. There is normal pulmonary artery systolic pressure. The tricuspid regurgitant velocity is 2.04 m/s, and  with an assumed right atrial pressure of 3 mmHg, the estimated right ventricular systolic pressure is 61.6 mmHg. Left Atrium: Left atrial size was normal in size. Right Atrium: Right atrial size was normal in size. Pericardium: There is no evidence of pericardial effusion. Mitral Valve: The mitral valve is normal in structure. Trivial mitral valve regurgitation. No evidence of mitral valve stenosis. Tricuspid Valve: The tricuspid valve is normal in structure. Tricuspid valve regurgitation is not demonstrated. No evidence of tricuspid stenosis. Aortic Valve: The aortic valve is normal in structure. Aortic valve regurgitation is not visualized. No aortic stenosis is present. Pulmonic Valve: The pulmonic valve was normal in structure. Pulmonic valve regurgitation is mild. No evidence of pulmonic stenosis. Aorta: The aortic root is normal in size and structure. Venous: The inferior vena cava is normal in size with greater than 50% respiratory variability, suggesting right atrial pressure of 3 mmHg. IAS/Shunts: No atrial level shunt detected by color flow Doppler.  LEFT VENTRICLE PLAX 2D LVIDd:         4.20 cm  Diastology LVIDs:         2.40 cm  LV e' medial:    5.78 cm/s LV PW:         1.10 cm  LV E/e' medial:  9.8 LV IVS:        0.90 cm  LV e' lateral:   10.20 cm/s LVOT diam:     2.00 cm  LV E/e' lateral: 5.5 LV SV:         65 LV SV Index:   29 LVOT Area:      3.14 cm  RIGHT VENTRICLE RV S prime:     14.30 cm/s TAPSE (M-mode): 1.6 cm LEFT ATRIUM             Index       RIGHT ATRIUM           Index LA diam:        3.50 cm 1.55 cm/m  RA Area:     13.90 cm LA Vol (A2C):   51.3 ml 22.70 ml/m RA Volume:   35.30 ml  15.62 ml/m LA Vol (A4C):   57.2 ml 25.31 ml/m LA Biplane Vol: 59.3 ml 26.24 ml/m  AORTIC VALVE LVOT Vmax:   124.00 cm/s LVOT Vmean:  85.300 cm/s LVOT VTI:    0.206 m  AORTA Ao Root diam: 3.30 cm Ao Asc diam:  3.30 cm MITRAL VALVE               TRICUSPID VALVE MV Area (PHT): 2.26 cm    TR Peak grad:   16.6 mmHg MV Decel Time: 335 msec    TR Vmax:  204.00 cm/s MV E velocity: 56.40 cm/s MV A velocity: 62.90 cm/s  SHUNTS MV E/A ratio:  0.90        Systemic VTI:  0.21 m                            Systemic Diam: 2.00 cm Cherlynn Kaiser MD Electronically signed by Cherlynn Kaiser MD Signature Date/Time: 12/12/2020/4:46:34 PM    Final    VAS US CAROTID  Result Date: 12/17/2020 Carotid Arterial Duplex Study Patient Name:  Matthew Francis  Date of Exam:   12/13/2020 Medical Rec #: [id number]      Accession #:    [id number] Date of Birth: [birth year]      Patient Gender: M Patient Age:   59Y Exam Location:  [facility] Procedure:      VAS US CAROTID Referring Phys: [id number] Rosalin Hawking --------------------------------------------------------------------------------  Indications:       CVA. Risk Factors:      Hypertension, hyperlipidemia. Comparison Study:  no prior Performing Technologist: Archie Patten RVS  Examination Guidelines: A complete evaluation includes B-mode imaging, spectral Doppler, color Doppler, and power Doppler as needed of all accessible portions of each vessel. Bilateral testing is considered an integral part of a complete examination. Limited examinations for reoccurring indications may be performed as noted.  Right Carotid Findings: +----------+--------+--------+--------+------------------+--------+           PSV cm/sEDV  cm/sStenosisPlaque DescriptionComments +----------+--------+--------+--------+------------------+--------+ CCA Prox  77      9               heterogenous               +----------+--------+--------+--------+------------------+--------+ CCA Distal65      14              heterogenous               +----------+--------+--------+--------+------------------+--------+ ICA Prox  39      13      1-39%   heterogenous               +----------+--------+--------+--------+------------------+--------+ ICA Distal37      14                                         +----------+--------+--------+--------+------------------+--------+ ECA       69      11                                         +----------+--------+--------+--------+------------------+--------+ +----------+--------+-------+--------+-------------------+           PSV cm/sEDV cmsDescribeArm Pressure (mmHG) +----------+--------+-------+--------+-------------------+ [id number]                                         +----------+--------+-------+--------+-------------------+ +---------+--------+--+--------+-+---------+ VertebralPSV cm/s31EDV cm/s9Antegrade +---------+--------+--+--------+-+---------+  Left Carotid Findings: +----------+--------+--------+--------+------------------+--------+           PSV cm/sEDV cm/sStenosisPlaque DescriptionComments +----------+--------+--------+--------+------------------+--------+ CCA Prox  118     22              heterogenous               +----------+--------+--------+--------+------------------+--------+ CCA Distal63      21  heterogenous               +----------+--------+--------+--------+------------------+--------+ ICA Prox  53      22      1-39%   heterogenous               +----------+--------+--------+--------+------------------+--------+ ICA Distal65      26                                          +----------+--------+--------+--------+------------------+--------+ ECA       73      18                                         +----------+--------+--------+--------+------------------+--------+ +----------+--------+--------+--------+-------------------+           PSV cm/sEDV cm/sDescribeArm Pressure (mmHG) +----------+--------+--------+--------+-------------------+ [id number]                                          +----------+--------+--------+--------+-------------------+ +---------+--------+--+--------+-+---------+ VertebralPSV cm/s32EDV cm/s9Antegrade +---------+--------+--+--------+-+---------+   Summary: Right Carotid: Velocities in the right ICA are consistent with a 1-39% stenosis. Left Carotid: Velocities in the left ICA are consistent with a 1-39% stenosis. Vertebrals: Bilateral vertebral arteries demonstrate antegrade flow. *See table(s) above for measurements and observations.  Electronically signed by Antony Contras MD on 12/17/2020 at 1:25:39 PM.    Final      PHYSICAL EXAM  Temp:  [98 F (36.7 C)-99.5 F (37.5 C)] 99.5 F (37.5 C) (07/04 1100) Pulse Rate:  [44-116] 76 (07/04 0713) Resp:  [12-33] 24 (07/04 0713) BP: (118-187)/(68-115) 140/100 (07/04 0713) SpO2:  [91 %-99 %] 97 % (07/04 0713)  General -mildly obese middle-aged African-American male, sitting up in bedside chair.  Not in distress.   Cardiovascular - Regular rhythm and rate.  No murmur or gallop  Mental Status -  Awake and oriented to person, place and time. Language including expression, naming, repetition, comprehension was intact. Fund of Knowledge was assessed and was intact.  Cranial Nerves II - XII - II - Visual field intact to threat. III, IV, VI - Extraocular movements intact. V - Facial sensation intact bilaterally. VII - Facial movement intact bilaterally. VIII - Hearing & vestibular intact bilaterally. X - did not cooperate. XI - Head turn intact. XII - Tongue  protrusion intact.  Motor Strength - The patient's strength was normal in all extremities and pronator drift was absent.  Bulk was normal and fasciculations were absent.  Mild weakness of left grip and intrinsic hand muscles.  Fine finger movements are diminished on the left.  Orbits right over left upper extremity. Motor Tone - Muscle tone was assessed at the neck and appendages and was normal.  Reflexes - The patient's reflexes were symmetrical in all extremities and he had no pathological reflexes.  Sensory - Light touch, temperature/pinprick were assessed and were symmetrical.    Coordination - The patient had normal movements in the hands with no ataxia or dysmetria.  Tremor was absent.  Gait and Station - deferred.   ASSESSMENT/PLAN [name] is a 60 y.o. male with history of ulcerative colitis, HTN, HLD, DM, CKD, lone A. fib admitted for headache, confusion, nausea vomiting. No tPA given due to  ICH. Overnight 12/14/2020, more confused and STAT CT head showed stable hemorrhage and  increase in ventricular size including temporal horns. Neurosurgery evaluated and determined hydrocephalus would not cause episodic symptoms and EEG did not show epileptiform discharges. Repeat CT head 12/15/2020 showed Improved hydrocephalus. He is currently AAOx3.  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 CT head 12/15/2020 stable hematoma and stable ventricular size MRI with and without contrast unchanged hematoma, no underlying mass MRA unremarkable, no aneurysm or AVM Carotid Doppler unremarkable 2D Echo EF 60 to 65% LDL 166 HgbA1c 5.9 SCDs for VTE prophylaxis No antithrombotic prior to admission, now on No antithrombotic given ICH Patient counseled to be compliant with his antithrombotic medications Ongoing aggressive stroke risk factor management Therapy recommendations:   none Disposition:  pending   IVH CT showed IVH right lateral ventricle, third ventricle and partial fourth ventricle Overnight 12/14/2020, more confused, STAT CT head stable hemorrhage and mild hydrocephalus. Neurosurgery opted no EVD. Repeat CT head 12/15/2020 showed Improved hydrocephalus. LTM EEG did not reveal epileptiform activity. CT repeat 7/1 some improvement of III ventricle Improved HA which was likely related to IVH and blood products  Short term oxycodone PRN - Hold. Continue to monitor.  Ulcerative colitis Frequent diarrhea Follows with GI at [facility] Recently prescribed Xifaxan but not able to afford the cost Restart Xifaxan this time as inpt On loperamide  Diabetes HgbA1c 5.9, goal < 7.0 Controlled CBG monitoring SSI DM education and close PCP follow up  Hypertension Home meds: Hydralazine, Norvasc and atenolol BP goal less than 160 On hydralazine 100 bid and Norvasc Long term BP goal normotensive  Hyperlipidemia Home meds: Crestor 10 every other day and Zetia LDL 166, goal < 70 Now on Crestor 20 daily Continue statin and Zetia at discharge  Cocaine abuse UDS positive for cocaine Avoid selective beta-blockers including metoprolol, atenolol Cessation education provided Patient is willing to quit  AKI creatinine 1.31-> 1.17->1.50->1.39 likely due to dehydration from diarrhea, which is better today Encourage po intake Management per primary team  Other Stroke Risk Factors Obesity  Cocaine abuse  Other Active Problems Lone A. fib in 01/2020 in the setting of sepsis Brief episode of asymptomatic bradycardia and remained alert with BP, RR, Oxygen saturation within normal reference range. Currently stable and will need follow up with cardiology.  Hx of opioids abuse.  Continues to c/o severe lower back spasms which radiate into lower extremities causing severe pain. Increased baclofen to 9m three times daily and will consider magnesium 8054mdaily.    Discussed with critical care team and the patient is stable and ready to transfer to intermediate care on to hospitalist service. Transfer order placed.  Hospital day # 6 Continue mobilize out of bed and therapy consults.  Continue strict control of hypertension.  Patient counseled to quit cocaine and to compliant with his blood pressure medications.  Transfer to neurology floor bed.  Discussed with Dr. PrBerle MullMD.This patient is critically ill and at significant risk of neurological worsening, death and care requires constant monitoring of vital signs, hemodynamics,respiratory and cardiac monitoring, extensive review of multiple databases, frequent neurological assessment, discussion with family, other specialists and medical decision making of high complexity.I have made any additions or clarifications directly to the above note.This critical care time does not reflect procedure time, or teaching time or supervisory time of PA/NP/Med Resident etc but could involve care  discussion time.  I spent 30 minutes of neurocritical care time  in the care of  this patient.    Antony Contras, MD 12/17/2020 2:15 PM  To contact Stroke Continuity provider, please refer to [email or web address] After hours, contact General Neurology

## 2020-12-17 NOTE — Progress Notes (Signed)
Pt with new onset confusion and restlessness with impulsiveness. Neuro informed and STAT head CT ordered.

## 2020-12-17 NOTE — Progress Notes (Signed)
Received from 4N ICU.  Oriented to room and surroundings.  Stroke education started.

## 2020-12-17 NOTE — Progress Notes (Signed)
eLink Physician-Brief Progress Note Patient Name: TYMEL CONELY DOB: Mar 13, 1961 MRN: [id number]   Date of Service  12/17/2020  HPI/Events of Note  Agitation - Patient keeps getting out of bed. Nursing request for a Posey belt.   eICU Interventions  Plan: Posey belt restraint X 6 hours.     Intervention Category Major Interventions: Delirium, psychosis, severe agitation - evaluation and management  Chardae Mulkern [place] 12/17/2020, 2:45 AM

## 2020-12-18 LAB — BLOOD CULTURE ID PANEL (REFLEXED) - BCID2

## 2020-12-18 LAB — BASIC METABOLIC PANEL
Anion gap: 10 (ref 5–15)
BUN: 16 mg/dL (ref 6–20)
CO2: 21 mmol/L — ABNORMAL LOW (ref 22–32)
Calcium: 10 mg/dL (ref 8.9–10.3)
Chloride: 105 mmol/L (ref 98–111)
Creatinine, Ser: 1.51 mg/dL — ABNORMAL HIGH (ref 0.61–1.24)
GFR, Estimated: 54 mL/min — ABNORMAL LOW (ref >60)
Glucose, Bld: 119 mg/dL — ABNORMAL HIGH (ref 70–99)
Potassium: 3.3 mmol/L — ABNORMAL LOW (ref 3.5–5.1)
Sodium: 136 mmol/L (ref 135–145)

## 2020-12-18 LAB — GLUCOSE, CAPILLARY
Glucose-Capillary: 160 mg/dL — ABNORMAL HIGH (ref 70–99)
Glucose-Capillary: 187 mg/dL — ABNORMAL HIGH (ref 70–99)
Glucose-Capillary: 151 mg/dL — ABNORMAL HIGH (ref 70–99)
Glucose-Capillary: 112 mg/dL — ABNORMAL HIGH (ref 70–99)

## 2020-12-18 LAB — MAGNESIUM: Magnesium: 2.2 mg/dL (ref 1.7–2.4)

## 2020-12-18 LAB — T3, FREE: T3, Free: 2.5 pg/mL (ref 2.0–4.4)

## 2020-12-18 MED ORDER — QUETIAPINE FUMARATE 25 MG PO TABS
12.5000 mg | ORAL_TABLET | Freq: Every day | ORAL | Status: DC
  Administered 2020-12-18: 12.5 mg via ORAL

## 2020-12-18 MED ORDER — QUETIAPINE FUMARATE 25 MG PO TABS: 25.0000 mg | ORAL_TABLET | Freq: Every day | ORAL | Status: DC

## 2020-12-18 MED ORDER — HALOPERIDOL LACTATE 5 MG/ML IJ SOLN
2.0000 mg | Freq: Four times a day (QID) | INTRAMUSCULAR | Status: DC | PRN
  Administered 2020-12-18: 2 mg via INTRAVENOUS
  Filled 2020-12-18: qty 1

## 2020-12-18 MED ORDER — LACTULOSE 10 GM/15ML PO SOLN
20.0000 g | Freq: Every day | ORAL | Status: DC
  Administered 2020-12-19: 20 g via ORAL
  Filled 2020-12-18 (×3): qty 30

## 2020-12-18 MED ORDER — RISPERIDONE 0.5 MG PO TABS
1.0000 mg | ORAL_TABLET | Freq: Two times a day (BID) | ORAL | Status: DC
  Administered 2020-12-18 – 2020-12-21 (×6): 1 mg via ORAL
  Filled 2020-12-18 (×6): qty 2

## 2020-12-18 MED ORDER — POTASSIUM CHLORIDE CRYS ER 20 MEQ PO TBCR
40.0000 meq | EXTENDED_RELEASE_TABLET | Freq: Once | ORAL | Status: AC
  Administered 2020-12-18: 40 meq via ORAL
  Filled 2020-12-18: qty 2

## 2020-12-18 MED ORDER — SODIUM CHLORIDE 0.9 % IV SOLN
2.0000 g | INTRAVENOUS | Status: DC
  Administered 2020-12-18 – 2020-12-20 (×3): 2 g via INTRAVENOUS
  Filled 2020-12-18 (×4): qty 20

## 2020-12-18 NOTE — Evaluation (Signed)
Speech Language Pathology Evaluation Patient Details Name: Matthew Francis MRN: [id number] DOB: [birth year] Today's Date: 12/18/2020 Time: 6144-3154 SLP Time Calculation (min) (ACUTE ONLY): 29 min  Problem List:  Patient Active Problem List   Diagnosis Date Noted   ICH (intracerebral hemorrhage) ([place]) 12/11/2020   UTI due to extended-spectrum beta lactamase (ESBL) producing Escherichia coli 01/19/2020   E coli bacteremia 01/18/2020   Severe sepsis ([place]) 01/17/2020   AKI (acute kidney injury) ([place]) 01/17/2020   Thrombocytopenia ([place]) 01/17/2020   Essential hypertension 01/17/2020   Ulcerative colitis ([place]) 01/17/2020   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 02/09/2012   Past Medical History:  Past Medical History:  Diagnosis Date   Arthritis    GERD (gastroesophageal reflux disease)    H/O ulcerative colitis    Hyperlipidemia    Hypertension    Inguinal hernia    Ulcer    ulcerative colitis   Ulcerative colitis    Wears glasses    Past Surgical History:  Past Surgical History:  Procedure Laterality Date   APPENDECTOMY     COLECTOMY     Proctocolectomy with IPAA   COLECTOMY     FOREIGN BODY REMOVAL ABDOMINAL  02/17/2012   Procedure: REMOVAL FOREIGN BODY ABDOMINAL;  Surgeon: Odis Hollingshead, MD;  Location: [facility] [facility];  Service: General;  Laterality: N/A;  abdominal wound exploration and removal of foreign body   HERNIA REPAIR     Multiple incisional hernias.   ileoanal anastomosis     HPI:  60 yo adm to [facility] with IVH/hemmorhagic CVA/hydrocephalus.  His hospital coarse has been complicated by extension of CVA, cerebral edema, brain herniation, cocaine w/d, with frequent agitation and pain.  Brain imaging 12/17/2020 showed stable appearance of the right basal ganglia hemorrhage with intraventricular extension and no worsening of hydrocephalus.   Speech eval ordered - pt has not been able to participate due to his pain and/or agitation.   Assessment /  Plan / Recommendation Clinical Impression  Patient participated in limited SLE given his compromised sustained attention, perseveration and pain/distractions (holding head, Pulling at covers, gown off, etc). He presents with severe cognitive linguistic deficits -with orientation to self only, place and year with verbal choice of two.  He does not demonstrate basic problem solving skills as he continually pulls off covers, gown, etc and is fidgety.  Expressive language is impaired at basic level with pt pausing, wincing with attempts to answer basic questions.  Use of sentence completion, verbal choice of 2 and writing helpful.  Pt did not recall use of call bell with 3 different repetitions - one demonstration.  Questions percentage of performance being CVA related vs encephalopathy.  SLP will follow up for skilled SLP cognitive linguistic treatment to facilitate compensation and decrease caregiver burden.  Aunt arrived to room *whom pt recognized and called by correct name* and SLP answered question re: pt's speech/cognition with her and pt. Encouraged his aunt to have (help)  the pt use the call bell for assistance prn.    SLP Assessment  SLP Recommendation/Assessment: Patient does not need any further Speech Lanaguage Pathology Services SLP Visit Diagnosis: Cognitive communication deficit (R41.841)    Follow Up Recommendations  Skilled Nursing facility    Frequency and Duration      TBD     SLP Evaluation Cognition  Orientation Level: Oriented to person;Disoriented to place;Disoriented to time;Disoriented to situation (with verbal choice of 2, pt able to verbalize hospital and correct year and with  fill in ' able to say "July") Attention: Sustained;Focused Sustained Attention: Impaired Sustained Attention Impairment: Functional basic;Verbal basic Memory: Impaired Memory Impairment: Storage deficit (did not recall use of call bell - which button to push, etc) Awareness: Impaired (trying to  get OOB although he requires significant assistance) Problem Solving: Impaired (trying to get OOB) Problem Solving Impairment: Functional basic       Comprehension  Auditory Comprehension Yes/No Questions: Within Functional Limits Basic Biographical Questions: 76-100% accurate Basic Immediate Environment Questions: 75-100% accurate Complex Questions: Not tested Commands: Impaired (pt did not follow commands for oral motor exam, suspect inattention and discomfort impacting him) Interfering Components: Pain;Attention;Processing speed;Working Curator: Not tested Reading Comprehension Reading Status: Not tested    Expression Expression Primary Mode of Expression: Verbal Verbal Expression Overall Verbal Expression: Impaired Initiation: No impairment Automatic Speech: Counting (pt did not name months of year, able to easily count) Repetition: No impairment (for sentence level information) Naming: Impairment Other Naming Comments: confrontational naming  1/2 items named correctly - able to read second items correctly; pt noted to perseverate during testing/session x2;  automatic speech tasks/phrase completion faciliated his communication Pragmatics: Impairment Impairments: Abnormal affect;Topic maintenance;Turn Taking;Eye contact Interfering Components: Attention Effective Techniques: Sentence completion Non-Verbal Means of Communication: Not applicable Written Expression Dominant Hand: Right Written Expression: Not tested   Oral / Motor  Oral Motor/Sensory Function Overall Oral Motor/Sensory Function: Other (comment) (pt did not follow directions - but able to articulate adequately) Motor Speech Overall Motor Speech: Appears within functional limits for tasks assessed Respiration: Within functional limits Phonation: Normal Resonance: Within functional limits Articulation: Within functional limitis Intelligibility:  Intelligible Motor Planning: Not tested   GO                    Matthew Francis 12/18/2020, 11:01 AM Kathleen Lime, MS [facility] SLP Acute Rehab Services Office [phone] Pager [phone]

## 2020-12-18 NOTE — Evaluation (Signed)
Physical Therapy Re Evaluation Patient Details Name: Matthew Francis MRN: [id number] DOB: May 08, 1961 Today's Date: 12/18/2020   History of Present Illness  Pt is a 60 y.o. male who presented 6/28 with AMS, nausea, and headache. 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 PMH: arthritis, GERD, HTN, inguinal hernia, and ulcerative colitis.  Clinical Impression  Pt admitted with above diagnosis. Pt very restless on eval, tugging against posey and wrist restraints and calling out. Pt speaking nonsensically, not oriented, and following minimal commands. Pt stood EOB with min A +2 and RW 2x but was able to maintain only a minute at a time. Pt was able to take side steps EOB with min A +2 and no knee buckling but could not ambulate further due to fatigue as well as decreased safety. Changing rec to CIR at d/c.  Pt currently with functional limitations due to the deficits listed below (see PT Problem List). Pt will benefit from skilled PT to increase their independence and safety with mobility to allow discharge to the venue listed below.       Follow Up Recommendations CIR;Supervision/Assistance - 24 hour    Equipment Recommendations  Other (comment) (TBD)    Recommendations for Other Services Rehab consult;OT consult     Precautions / Restrictions Precautions Precautions: Other (comment) Precaution Comments: SBP < 140 Restrictions Weight Bearing Restrictions: No      Mobility  Bed Mobility Overal bed mobility: Needs Assistance Bed Mobility: Supine to Sit;Sit to Supine     Supine to sit: Min assist;+2 for safety/equipment Sit to supine: Min assist;+2 for safety/equipment   General bed mobility comments: pt needed visual cues to come to EOB. Min HHA given with +2 from behind for safety    Transfers Overall transfer level: Needs assistance Equipment used: Rolling walker (2 wheeled) Transfers: Sit to/from  Stand Sit to Stand: Min assist;+2 safety/equipment         General transfer comment: min A to steady and pt maintained only 1 min before needing to sit. +2 for safety as pt was impulsive  Ambulation/Gait Ambulation/Gait assistance: Min assist;+2 physical assistance   Assistive device: Rolling walker (2 wheeled) Gait Pattern/deviations: Step-to pattern   Gait velocity interpretation: <1.31 ft/sec, indicative of household ambulator General Gait Details: sidesteps to [facility] was as much as ot could tolerate today, unsteady L side  Stairs            Wheelchair Mobility    Modified Rankin (Stroke Patients Only) Modified Rankin (Stroke Patients Only) Pre-Morbid Rankin Score: No symptoms Modified Rankin: Moderately severe disability     Balance Overall balance assessment: Needs assistance Sitting-balance support: Feet supported;Bilateral upper extremity supported Sitting balance-Leahy Scale: Poor Sitting balance - Comments: supervision needed for safety   Standing balance support: Bilateral upper extremity supported Standing balance-Leahy Scale: Poor Standing balance comment: reliant on UE support                             Pertinent Vitals/Pain Pain Assessment: Faces Faces Pain Scale: Hurts little more Pain Location: head Pain Descriptors / Indicators: Restless;Other (Comment) (pt repeatedly rubbing his head) Pain Intervention(s): Monitored during session    [place] expects to be discharged to:: Private residence Living Arrangements: Alone Available Help at Discharge: Friend(s);Available 24 hours/day Type of Home: House Home Access: Stairs to enter Entrance Stairs-Rails: None Entrance Stairs-Number of Steps: 1 Home Layout: One  level Home Equipment: None      Prior Function Level of Independence: Independent         Comments: Works at [employer] for [employer].     Hand Dominance   Dominant Hand:  Right    Extremity/Trunk Assessment   Upper Extremity Assessment Upper Extremity Assessment: Defer to OT evaluation    Lower Extremity Assessment Lower Extremity Assessment: Difficult to assess due to impaired cognition LLE Deficits / Details: unable to accurately assess as pt not consistently following commands. No L knee buckling in standing but seemed unsteady on L side LLE Coordination: decreased gross motor    Cervical / Trunk Assessment Cervical / Trunk Assessment: Normal  Communication   Communication: Receptive difficulties;Expressive difficulties  Cognition Arousal/Alertness: Awake/alert Behavior During Therapy: Restless Overall Cognitive Status: Impaired/Different from baseline Area of Impairment: Orientation;Attention;Memory;Following commands;Safety/judgement;Awareness;Problem solving                 Orientation Level: Disoriented to;Time;Place;Situation Current Attention Level: Focused Memory: Decreased short-term memory Following Commands: Follows one step commands inconsistently;Follows one step commands with increased time Safety/Judgement: Decreased awareness of safety;Decreased awareness of deficits Awareness: Intellectual Problem Solving: Difficulty sequencing;Requires verbal cues;Requires tactile cues General Comments: pt with continuous nonsensical conversation and then saying "you know what I mean".      General Comments General comments (skin integrity, edema, etc.): Pt returned to supine with posey and wrist restraints. Calmer after session than before    Exercises     Assessment/Plan    PT Assessment Patient needs continued PT services  PT Problem List Decreased cognition;Decreased coordination;Decreased mobility;Decreased balance;Decreased activity tolerance;Decreased strength;Decreased knowledge of use of DME;Decreased safety awareness;Decreased knowledge of precautions       PT Treatment Interventions DME instruction;Gait training;Stair  training;Functional mobility training;Therapeutic activities;Therapeutic exercise;Balance training;Neuromuscular re-education;Cognitive remediation;Patient/family education    PT Goals (Current goals can be found in the Care Plan section)  Acute Rehab PT Goals Patient Stated Goal: none stated PT Goal Formulation: Patient unable to participate in goal setting Time For Goal Achievement: 01/01/21 Potential to Achieve Goals: Good    Frequency Min 4X/week   Barriers to discharge Decreased caregiver support lives alone    Co-evaluation               AM-PAC PT "6 Clicks" Mobility  Outcome Measure Help needed turning from your back to your side while in a flat bed without using bedrails?: A Little Help needed moving from lying on your back to sitting on the side of a flat bed without using bedrails?: A Little Help needed moving to and from a bed to a chair (including a wheelchair)?: Total Help needed standing up from a chair using your arms (e.g., wheelchair or bedside chair)?: Total Help needed to walk in hospital room?: Total Help needed climbing 3-5 steps with a railing? : Total 6 Click Score: 10    End of Session Equipment Utilized During Treatment: Gait belt Activity Tolerance: Patient tolerated treatment well Patient left: in bed;with call bell/phone within reach;with bed alarm set;with restraints reapplied Nurse Communication: Mobility status PT Visit Diagnosis: Muscle weakness (generalized) (M62.81);Other symptoms and signs involving the nervous system (R29.898);Difficulty in walking, not elsewhere classified (R26.2)    Time: 6720-9470 PT Time Calculation (min) (ACUTE ONLY): 20 min   Charges:   PT Evaluation $PT Re-evaluation: 1 Re-eval          Leighton Roach, PT  Acute Rehab Services  Pager [phone] Office [place]  12/18/2020, 5:44 PM

## 2020-12-18 NOTE — Progress Notes (Signed)
STROKE TEAM PROGRESS NOTE   SUBJECTIVE (INTERVAL HISTORY) There is a lady family member at the bedside.  Patient again had confusion restlessness and combativeness this morning and now needing restraints.    Blood pressure adequately controlled.  Vital signs are stable. Therapists recommend SNF rehab    OBJECTIVE Temp:  [97.7 F (36.5 C)-98.9 F (37.2 C)] 98.4 F (36.9 C) (07/05 1200) Pulse Rate:  [59-87] 85 (07/05 1200) Cardiac Rhythm: Normal sinus rhythm (07/05 0832) Resp:  [14-18] 18 (07/05 1200) BP: (119-142)/(65-84) 125/83 (07/05 1200) SpO2:  [98 %-100 %] 99 % (07/05 1200)  Recent Labs  Lab 12/17/20 1122 12/17/20 1656 12/17/20 2127 12/18/20 0650 12/18/20 1158  GLUCAP 133* 122* 167* 160* 187*   Recent Labs  Lab 12/14/20 0350 12/15/20 0033 12/16/20 2358 12/17/20 0932 12/18/20 1012  NA 140 135 134* 133* 136  K 3.9 4.0 4.0 3.2* 3.3*  CL 108 103 101 100 105  CO2 23 21* 26 20* 21*  GLUCOSE 136* 120* 139* 181* 119*  BUN 12 8 9 10 16   CREATININE 1.39* 1.05 1.18 1.49* 1.51*  CALCIUM 9.8 9.6 9.9 10.1 10.0  MG 2.5*  --  2.1  --  2.2  PHOS 2.7  --   --   --   --    Recent Labs  Lab 12/11/20 1900 12/14/20 0350 12/17/20 0932  AST 16 26 46*  ALT 15 20 75*  ALKPHOS 76 62 86  BILITOT 1.0 1.1 1.2  PROT 8.4* 7.5 7.8  ALBUMIN 4.5 4.1 4.2   Recent Labs  Lab 12/12/20 0212 12/13/20 0316 12/14/20 0350 12/15/20 0033 12/16/20 2358 12/17/20 0932  WBC 8.6 8.3 8.1 10.6* 8.1 8.4  NEUTROABS 7.8* 4.7 6.0 7.8* 5.5  --   HGB 13.9 13.5 13.8 13.7 14.8 15.2  HCT 42.7 41.3 43.3 42.5 44.0 44.9  MCV 93.4 94.3 95.2 93.6 91.1 90.2  PLT 215 207 212 195 211 196   No results for input(s): CKTOTAL, CKMB, CKMBINDEX, TROPONINI in the last 168 hours. No results for input(s): LABPROT, INR in the last 72 hours.  Recent Labs    12/16/20 0446  COLORURINE YELLOW  LABSPEC 1.011  PHURINE 6.0  GLUCOSEU NEGATIVE  HGBUR NEGATIVE  BILIRUBINUR NEGATIVE  KETONESUR NEGATIVE  PROTEINUR  NEGATIVE  NITRITE NEGATIVE  LEUKOCYTESUR NEGATIVE        Component Value Date/Time   CHOL 268 (H) 12/12/2020 0212   TRIG 71 12/12/2020 0212   HDL 88 12/12/2020 0212   CHOLHDL 3.0 12/12/2020 0212   VLDL 14 12/12/2020 0212   LDLCALC 166 (H) 12/12/2020 0212   Lab Results  Component Value Date   HGBA1C 5.9 (H) 12/12/2020      Component Value Date/Time   LABOPIA NONE DETECTED 12/12/2020 0324   COCAINSCRNUR POSITIVE (A) 12/12/2020 0324   LABBENZ NONE DETECTED 12/12/2020 0324   AMPHETMU NONE DETECTED 12/12/2020 0324   THCU NONE DETECTED 12/12/2020 0324   LABBARB NONE DETECTED 12/12/2020 0324    No results for input(s): ETH in the last 168 hours.  I have personally reviewed the radiological images below and agree with the radiology interpretations.  CT HEAD WO CONTRAST  Result Date: 12/17/2020 CLINICAL DATA:  Intracranial hemorrhage follow EXAM: CT HEAD WITHOUT CONTRAST TECHNIQUE: Contiguous axial images were obtained from the base of the skull through the vertex without intravenous contrast. COMPARISON:  12/15/2020 FINDINGS: Brain: Unchanged intraparenchymal hemorrhage in the right caudate head extending into the ventricular system. Unchanged mild communicating hydrocephalus. No new site of  hemorrhage. Vascular: No hyperdense vessel or unexpected calcification. Skull: Normal. Negative for fracture or focal lesion. Sinuses/Orbits: No acute finding. Other: None IMPRESSION: 1. Unchanged intraparenchymal hemorrhage in the right caudate head extending into the ventricular system. 2. Unchanged mild communicating hydrocephalus. Electronically Signed   By: Ulyses Jarred M.D.   On: 12/17/2020 01:18   CT HEAD WO CONTRAST  Result Date: 12/15/2020 CLINICAL DATA:  Intracranial hemorrhage follow-up EXAM: CT HEAD WITHOUT CONTRAST TECHNIQUE: Contiguous axial images were obtained from the base of the skull through the vertex without intravenous contrast. COMPARISON:  12/14/2020 FINDINGS: Brain: Persistent  intraventricular extension of hemorrhage from the right caudate head hematoma. Hydrocephalus has improved. No new site of hemorrhage. Vascular: No abnormal hyperdensity of the major intracranial arteries or dural venous sinuses. No intracranial atherosclerosis. Skull: The visualized skull base, calvarium and extracranial soft tissues are normal. Sinuses/Orbits: No fluid levels or advanced mucosal thickening of the visualized paranasal sinuses. No mastoid or middle ear effusion. The orbits are normal. IMPRESSION: 1. Improved hydrocephalus. 2. Persistent intraventricular extension of hemorrhage from the right caudate head hematoma. Electronically Signed   By: Ulyses Jarred M.D.   On: 12/15/2020 02:22   CT HEAD WO CONTRAST  Result Date: 12/14/2020 CLINICAL DATA:  Follow-up intracranial hemorrhage EXAM: CT HEAD WITHOUT CONTRAST TECHNIQUE: Contiguous axial images were obtained from the base of the skull through the vertex without intravenous contrast. COMPARISON:  12/13/2020 FINDINGS: Brain: There again noted changes consistent with hemorrhage in the head of the caudate nucleus on the right. Intraventricular extension into the right lateral ventricle is noted. The thrombus is stable although there are changes of progressive dilatation in the lateral and third ventricles. Fourth ventricle appears within normal limits. No new hemorrhage is seen. Findings suggestive of increasing intracranial pressure are noted with decrease in the sulcal markings when compared with the previous day. Thrombus is noted within the third ventricle which appears to extend into cerebral aqueduct causing the dilatation. Vascular: No hyperdense vessel or unexpected calcification. Skull: Normal. Negative for fracture or focal lesion. Sinuses/Orbits: No acute finding. Other: None. IMPRESSION: Persistent hemorrhage in the head of the caudate nucleus on the right with evidence of interventricular thrombus in the lateral ventricles bilaterally and  third ventricle. There is interval dilatation of the lateral and third ventricles with findings of thrombus and apparent occlusion of the cerebral aqueduct related to change in the morphology of the third ventricular thrombus. Additionally signs of increased intracranial pressure are noted with decreased sulcal markings when compared with the previous day. Critical Value/emergent results were called by telephone at the time of interpretation on 12/14/2020 at 4:42 pm to Dr. Rosalin Hawking , who verbally acknowledged these results. Electronically Signed   By: [name] M.D.   On: 12/14/2020 16:43   CT HEAD WO CONTRAST  Result Date: 12/13/2020 CLINICAL DATA:  Worsening headache EXAM: CT HEAD WITHOUT CONTRAST TECHNIQUE: Contiguous axial images were obtained from the base of the skull through the vertex without intravenous contrast. COMPARISON:  12/11/2020 FINDINGS: Brain: Again seen is the right basal ganglia intracerebral hemorrhage with intraventricular extension. No significant change since prior study. Stable mild communicating hydrocephalus. No significant midline shift. Vascular: No hyperdense vessel or unexpected calcification. Skull: No acute calvarial abnormality. 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 No change since prior study. Electronically Signed   By: Rolm Baptise M.D.   On: 12/13/2020 21:40   CT HEAD WO CONTRAST  Result Date: 12/12/2020  CLINICAL DATA:  Stroke follow-up.  Pupillary asymmetry. EXAM: CT HEAD WITHOUT CONTRAST TECHNIQUE: Contiguous axial images were obtained from the base of the skull through the vertex without intravenous contrast. COMPARISON:  Head CT 12/11/2020 at 7:28 p.m. FINDINGS: Brain: Unchanged appearance of intraparenchymal hematoma centered at the right caudate head. There is extension of hemorrhage into the ventricles with mild communicating hydrocephalus. There is no midline shift. No  new site of hemorrhage. Vascular: No abnormal hyperdensity of the major intracranial arteries or dural venous sinuses. No intracranial atherosclerosis. Skull: The visualized skull base, calvarium and extracranial soft tissues are normal. Sinuses/Orbits: No fluid levels or advanced mucosal thickening of the visualized paranasal sinuses. No mastoid or middle ear effusion. The orbits are normal. IMPRESSION: Unchanged appearance of intraparenchymal hematoma centered at the right caudate head with intraventricular extension and mild communicating hydrocephalus. Electronically Signed   By: Ulyses Jarred M.D.   On: 12/12/2020 00:10   CT Head Wo Contrast  Result Date: 12/11/2020 CLINICAL DATA:  Headache EXAM: CT HEAD WITHOUT CONTRAST TECHNIQUE: Contiguous axial images were obtained from the base of the skull through the vertex without intravenous contrast. COMPARISON:  CT brain 02/19/2020 FINDINGS: Brain: 19 x 20 mm acute hemorrhage within the right caudate/basal ganglia. Moderate acute hemorrhage within the right lateral ventricle with additional hemorrhage in the third and fourth ventricles. The ventricles are mildly enlarged. No midline shift. Mild chronic small vessel ischemic changes of the white matter. Vascular: No hyperdense vessels.  No unexpected calcification Skull: Normal. Negative for fracture or focal lesion. Sinuses/Orbits: No acute finding. Other: None IMPRESSION: 1. Acute hemorrhage within the right caudate/basal ganglia with hemorrhage in the right lateral, third, and fourth ventricles. No midline shift. The ventricles are slightly dilated as compared with prior CT from 2021. 2. Mild chronic small vessel ischemic change of the white matter. Critical Value/emergent results were called by telephone at the time of interpretation on 12/11/2020 at 7:45 pm to provider ADAM CURATOLO , who verbally acknowledged these results. Electronically Signed   By: Donavan Foil M.D.   On: 12/11/2020 19:50   MR MRA HEAD  WO CONTRAST  Result Date: 12/12/2020 CLINICAL DATA:  Intracranial hemorrhage. EXAM: MRI HEAD WITHOUT AND WITH CONTRAST MRA HEAD WITHOUT CONTRAST TECHNIQUE: Multiplanar, multi-echo pulse sequences of the brain and surrounding structures were acquired without and with intravenous contrast. Angiographic images of the Circle of Willis were acquired using MRA technique without intravenous contrast. CONTRAST:  42m GADAVIST GADOBUTROL 1 MMOL/ML IV SOLN COMPARISON:  Head CT 12/11/2020 FINDINGS: MRI HEAD FINDINGS Brain: Unchanged intraparenchymal hematoma of the right caudate head with intraventricular extension. 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 No acute infarct. There is multifocal hyperintense T2-weighted signal within the white matter. Generalized volume loss without a clear lobar predilection. There is no abnormal contrast enhancement. Vascular: Major flow voids are preserved. Skull and upper cervical spine: Normal calvarium and skull base. Visualized upper cervical spine and soft tissues are normal. Sinuses/Orbits:No paranasal sinus fluid levels or advanced mucosal thickening. No mastoid or middle ear effusion. Normal orbits. MRA HEAD FINDINGS POSTERIOR CIRCULATION: --Vertebral arteries: Normal --Inferior cerebellar arteries: Normal. --Basilar artery: Normal. --Superior cerebellar arteries: Normal. --Posterior cerebral arteries: Normal. Both are predominantly supplied by the posterior communicating arteries (p-comm). ANTERIOR CIRCULATION: --Intracranial internal carotid arteries: Normal. --Anterior cerebral arteries (ACA): Normal. --Middle cerebral arteries (MCA): Normal. ANATOMIC VARIANTS: Fetal origins of both posterior cerebral arteries. IMPRESSION: 1. Unchanged intraparenchymal hematoma of the right caudate head with intraventricular extension and mild communicating hydrocephalus. 2. Normal  intracranial MRA. Electronically Signed   By: Ulyses Jarred M.D.   On: 12/12/2020 20:50   MR BRAIN W WO  CONTRAST  Result Date: 12/12/2020 CLINICAL DATA:  Intracranial hemorrhage. EXAM: MRI HEAD WITHOUT AND WITH CONTRAST MRA HEAD WITHOUT CONTRAST TECHNIQUE: Multiplanar, multi-echo pulse sequences of the brain and surrounding structures were acquired without and with intravenous contrast. Angiographic images of the Circle of Willis were acquired using MRA technique without intravenous contrast. CONTRAST:  65m GADAVIST GADOBUTROL 1 MMOL/ML IV SOLN COMPARISON:  Head CT 12/11/2020 FINDINGS: MRI HEAD FINDINGS Brain: Unchanged intraparenchymal hematoma of the right caudate head with intraventricular extension. 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 No acute infarct. There is multifocal hyperintense T2-weighted signal within the white matter. Generalized volume loss without a clear lobar predilection. There is no abnormal contrast enhancement. Vascular: Major flow voids are preserved. Skull and upper cervical spine: Normal calvarium and skull base. Visualized upper cervical spine and soft tissues are normal. Sinuses/Orbits:No paranasal sinus fluid levels or advanced mucosal thickening. No mastoid or middle ear effusion. Normal orbits. MRA HEAD FINDINGS POSTERIOR CIRCULATION: --Vertebral arteries: Normal --Inferior cerebellar arteries: Normal. --Basilar artery: Normal. --Superior cerebellar arteries: Normal. --Posterior cerebral arteries: Normal. Both are predominantly supplied by the posterior communicating arteries (p-comm). ANTERIOR CIRCULATION: --Intracranial internal carotid arteries: Normal. --Anterior cerebral arteries (ACA): Normal. --Middle cerebral arteries (MCA): Normal. ANATOMIC VARIANTS: Fetal origins of both posterior cerebral arteries. IMPRESSION: 1. Unchanged intraparenchymal hematoma of the right caudate head with intraventricular extension and mild communicating hydrocephalus. 2. Normal intracranial MRA. Electronically Signed   By: KUlyses JarredM.D.   On: 12/12/2020 20:50   DG CHEST PORT 1  VIEW  Result Date: 12/15/2020 CLINICAL DATA:  Headache.  Bradycardia. EXAM: PORTABLE CHEST 1 VIEW COMPARISON:  02/19/2020 FINDINGS: The heart size and mediastinal contours are within normal limits. Both lungs are clear. The visualized skeletal structures are unremarkable. IMPRESSION: No active disease. Electronically Signed   By: WLucienne CapersM.D.   On: 12/15/2020 22:16   EEG adult  Result Date: 12/14/2020 YLora Havens MD     12/15/2020  7:07 AM Patient Name: Matthew AROMRN: [id number] Attending: PLora HavensReferring Physician/Provider: Dr NConsuella LoseDate: 12/14/2020 Duration: 24.46 mins Patient history: 60y.o. male with basal ganglia hemorrhage with intraventricular extension who has been neurologically well, with what appears to be episodic confusion and somnolence. EEG to evaluate for seizure  Level of alertness: Awake  AEDs during EEG study: None Technical aspects: This EEG study was done with scalp electrodes positioned according to the 10-20 International system of electrode placement. Electrical activity was acquired at a sampling rate of 500Hz  and reviewed with a high frequency filter of 70Hz  and a low frequency filter of 1Hz . EEG data were recorded continuously and digitally stored. Description: The posterior dominant rhythm consists of 8 Hz activity of moderate voltage (25-35 uV) seen predominantly in posterior head regions, symmetric and reactive to eye opening and eye closing. EEG showed continuous 3 to 6 Hz theta-delta slowing in right hemisphere, maximal temporal region. Hyperventilation and photic stimulation were not performed.   ABNORMALITY - Continuous slow, right hemisphere, maximal temporal region IMPRESSION: This study is suggestive of cortical dysfunction arising from right hemisphere, maximal right temporal region, likely secondary to underlying structural abnormality/bleed. No seizures or epileptiform discharges were seen throughout the recording. Priyanka OBarbra Sarks  Overnight EEG with video  Result Date: 12/15/2020 YLora Havens MD     12/16/2020  9:18 AM Patient  Name: Matthew Francis MRN: [id number] Epilepsy Attending: Lora Havens Referring Physician/Provider: Dr Consuella Lose Duration: 12/14/2020 1932 to 12/15/2020 1244  Patient history: 60 y.o. male with basal ganglia hemorrhage with intraventricular extension who has been neurologically well, with what appears to be episodic confusion and somnolence. EEG to evaluate for seizure  Level of alertness: Awake  AEDs during EEG study: None  Technical aspects: This EEG study was done with scalp electrodes positioned according to the 10-20 International system of electrode placement. Electrical activity was acquired at a sampling rate of 500Hz  and reviewed with a high frequency filter of 70Hz  and a low frequency filter of 1Hz . EEG data were recorded continuously and digitally stored.  Description: The posterior dominant rhythm consists of 8 Hz activity of moderate voltage (25-35 uV) seen predominantly in posterior head regions, symmetric and reactive to eye opening and eye closing. EEG initially showed continuous 3 to 6 Hz theta-delta slowing in right hemisphere, maximal temporal region which gradually improved to intermittent 3-5hz  theta-delta slowing in right hemisphere, maximal temporal region. Hyperventilation and photic stimulation were not performed.    ABNORMALITY - Continuous slow, right hemisphere, maximal temporal region  IMPRESSION: This study is suggestive of cortical dysfunction arising from right hemisphere, maximal right temporal region, likely secondary to underlying structural abnormality/bleed. There was gradual improvement in slowing which could indicate that it was post-ictal slowing as well.  No seizures or epileptiform discharges were seen throughout the recording.  Lora Havens   ECHOCARDIOGRAM COMPLETE  Result Date: 12/12/2020    ECHOCARDIOGRAM REPORT   Patient Name:   Matthew Francis  Date of Exam: 12/12/2020 Medical Rec #:  [id number]     Height:       70.5 in Accession #:    [id number]    Weight:       238.1 lb Date of Birth:  [birth year]     BSA:          2.260 m Patient Age:    40 years      BP:           138/94 mmHg Patient Gender: M             HR:           95 bpm. Exam Location:  Inpatient Procedure: 2D Echo, Color Doppler and Cardiac Doppler Indications:    Stroke I63.9  History:        Patient has no prior history of Echocardiogram examinations.                 Risk Factors:Dyslipidemia and Hypertension.  Sonographer:    Bernadene Person RDCS Referring Phys: [id number] [place]  1. Left ventricular ejection fraction, by estimation, is 60 to 65%. The left ventricle has normal function. The left ventricle has no regional wall motion abnormalities. Left ventricular diastolic parameters are consistent with Grade I diastolic dysfunction (impaired relaxation).  2. Right ventricular systolic function is normal. The right ventricular size is normal. There is normal pulmonary artery systolic pressure. The estimated right ventricular systolic pressure is 61.9 mmHg.  3. The mitral valve is normal in structure. Trivial mitral valve regurgitation. No evidence of mitral stenosis.  4. The aortic valve is normal in structure. Aortic valve regurgitation is not visualized. No aortic stenosis is present.  5. The inferior vena cava is normal in size with greater than 50% respiratory variability, suggesting right atrial pressure of 3 mmHg. Conclusion(s)/Recommendation(s): No intracardiac source of embolism detected on  this transthoracic study. A transesophageal echocardiogram is recommended to exclude cardiac source of embolism if clinically indicated. FINDINGS  Left Ventricle: Left ventricular ejection fraction, by estimation, is 60 to 65%. The left ventricle has normal function. The left ventricle has no regional wall motion abnormalities. The left ventricular internal cavity size was normal  in size. There is  borderline left ventricular hypertrophy. Left ventricular diastolic parameters are consistent with Grade I diastolic dysfunction (impaired relaxation). Right Ventricle: The right ventricular size is normal. No increase in right ventricular wall thickness. Right ventricular systolic function is normal. There is normal pulmonary artery systolic pressure. The tricuspid regurgitant velocity is 2.04 m/s, and  with an assumed right atrial pressure of 3 mmHg, the estimated right ventricular systolic pressure is 59.9 mmHg. Left Atrium: Left atrial size was normal in size. Right Atrium: Right atrial size was normal in size. Pericardium: There is no evidence of pericardial effusion. Mitral Valve: The mitral valve is normal in structure. Trivial mitral valve regurgitation. No evidence of mitral valve stenosis. Tricuspid Valve: The tricuspid valve is normal in structure. Tricuspid valve regurgitation is not demonstrated. No evidence of tricuspid stenosis. Aortic Valve: The aortic valve is normal in structure. Aortic valve regurgitation is not visualized. No aortic stenosis is present. Pulmonic Valve: The pulmonic valve was normal in structure. Pulmonic valve regurgitation is mild. No evidence of pulmonic stenosis. Aorta: The aortic root is normal in size and structure. Venous: The inferior vena cava is normal in size with greater than 50% respiratory variability, suggesting right atrial pressure of 3 mmHg. IAS/Shunts: No atrial level shunt detected by color flow Doppler.  LEFT VENTRICLE PLAX 2D LVIDd:         4.20 cm  Diastology LVIDs:         2.40 cm  LV e' medial:    5.78 cm/s LV PW:         1.10 cm  LV E/e' medial:  9.8 LV IVS:        0.90 cm  LV e' lateral:   10.20 cm/s LVOT diam:     2.00 cm  LV E/e' lateral: 5.5 LV SV:         65 LV SV Index:   29 LVOT Area:     3.14 cm  RIGHT VENTRICLE RV S prime:     14.30 cm/s TAPSE (M-mode): 1.6 cm LEFT ATRIUM             Index       RIGHT ATRIUM           Index LA  diam:        3.50 cm 1.55 cm/m  RA Area:     13.90 cm LA Vol (A2C):   51.3 ml 22.70 ml/m RA Volume:   35.30 ml  15.62 ml/m LA Vol (A4C):   57.2 ml 25.31 ml/m LA Biplane Vol: 59.3 ml 26.24 ml/m  AORTIC VALVE LVOT Vmax:   124.00 cm/s LVOT Vmean:  85.300 cm/s LVOT VTI:    0.206 m  AORTA Ao Root diam: 3.30 cm Ao Asc diam:  3.30 cm MITRAL VALVE               TRICUSPID VALVE MV Area (PHT): 2.26 cm    TR Peak grad:   16.6 mmHg MV Decel Time: 335 msec    TR Vmax:        204.00 cm/s MV E velocity: 56.40 cm/s MV A velocity: 62.90 cm/s  SHUNTS MV E/A ratio:  0.90        Systemic VTI:  0.21 m                            Systemic Diam: 2.00 cm Cherlynn Kaiser MD Electronically signed by Cherlynn Kaiser MD Signature Date/Time: 12/12/2020/4:46:34 PM    Final    VAS US CAROTID  Result Date: 12/17/2020 Carotid Arterial Duplex Study Patient Name:  Matthew Francis  Date of Exam:   12/13/2020 Medical Rec #: [id number]      Accession #:    [id number] Date of Birth: 12-09-1960      Patient Gender: M Patient Age:   59Y Exam Location:  [facility] Procedure:      VAS US CAROTID Referring Phys: [id number] Rosalin Hawking --------------------------------------------------------------------------------  Indications:       CVA. Risk Factors:      Hypertension, hyperlipidemia. Comparison Study:  no prior Performing Technologist: Archie Patten RVS  Examination Guidelines: A complete evaluation includes B-mode imaging, spectral Doppler, color Doppler, and power Doppler as needed of all accessible portions of each vessel. Bilateral testing is considered an integral part of a complete examination. Limited examinations for reoccurring indications may be performed as noted.  Right Carotid Findings: +----------+--------+--------+--------+------------------+--------+           PSV cm/sEDV cm/sStenosisPlaque DescriptionComments +----------+--------+--------+--------+------------------+--------+ CCA Prox  77      9                heterogenous               +----------+--------+--------+--------+------------------+--------+ CCA Distal65      14              heterogenous               +----------+--------+--------+--------+------------------+--------+ ICA Prox  39      13      1-39%   heterogenous               +----------+--------+--------+--------+------------------+--------+ ICA Distal37      14                                         +----------+--------+--------+--------+------------------+--------+ ECA       69      11                                         +----------+--------+--------+--------+------------------+--------+ +----------+--------+-------+--------+-------------------+           PSV cm/sEDV cmsDescribeArm Pressure (mmHG) +----------+--------+-------+--------+-------------------+ [id number]                                         +----------+--------+-------+--------+-------------------+ +---------+--------+--+--------+-+---------+ VertebralPSV cm/s31EDV cm/s9Antegrade +---------+--------+--+--------+-+---------+  Left Carotid Findings: +----------+--------+--------+--------+------------------+--------+           PSV cm/sEDV cm/sStenosisPlaque DescriptionComments +----------+--------+--------+--------+------------------+--------+ CCA Prox  118     22              heterogenous               +----------+--------+--------+--------+------------------+--------+ CCA Distal63      21              heterogenous               +----------+--------+--------+--------+------------------+--------+  ICA Prox  53      22      1-39%   heterogenous               +----------+--------+--------+--------+------------------+--------+ ICA Distal65      26                                         +----------+--------+--------+--------+------------------+--------+ ECA       73      18                                          +----------+--------+--------+--------+------------------+--------+ +----------+--------+--------+--------+-------------------+           PSV cm/sEDV cm/sDescribeArm Pressure (mmHG) +----------+--------+--------+--------+-------------------+ [id number]                                          +----------+--------+--------+--------+-------------------+ +---------+--------+--+--------+-+---------+ VertebralPSV cm/s32EDV cm/s9Antegrade +---------+--------+--+--------+-+---------+   Summary: Right Carotid: Velocities in the right ICA are consistent with a 1-39% stenosis. Left Carotid: Velocities in the left ICA are consistent with a 1-39% stenosis. Vertebrals: Bilateral vertebral arteries demonstrate antegrade flow. *See table(s) above for measurements and observations.  Electronically signed by Antony Contras MD on 12/17/2020 at 1:25:39 PM.    Final      PHYSICAL EXAM  Temp:  [97.7 F (36.5 C)-98.9 F (37.2 C)] 98.4 F (36.9 C) (07/05 1200) Pulse Rate:  [59-87] 85 (07/05 1200) Resp:  [14-18] 18 (07/05 1200) BP: (119-142)/(65-84) 125/83 (07/05 1200) SpO2:  [98 %-100 %] 99 % (07/05 1200)  General -mildly obese middle-aged African-American male, sitting up in bedside chair.  Not in distress.   Cardiovascular - Regular rhythm and rate.  No murmur or gallop  Mental Status -  Awake and oriented to person, place and time.diminished attention, registration and recall. Mild expressive aphasia with nonfluent speech can speak only short sentences.  Can follow only simple midline and one-step commands. Fund of Knowledge was assessed and was intact.  Cranial Nerves II - XII - II - Visual field intact to threat. III, IV, VI - Extraocular movements intact. V - Facial sensation intact bilaterally. VII - Facial movement intact bilaterally. VIII - Hearing & vestibular intact bilaterally. X - did not cooperate. XI - Head turn intact. XII - Tongue protrusion intact.  Motor Strength - The  patient's strength was normal in all extremities and pronator drift was absent.  Bulk was normal and fasciculations were absent.  Mild weakness of left grip and intrinsic hand muscles.  Fine finger movements are diminished on the left.  Orbits right over left upper extremity. Motor Tone - Muscle tone was assessed at the neck and appendages and was normal.  Reflexes - The patient's reflexes were symmetrical in all extremities and he had no pathological reflexes.  Sensory - Light touch, temperature/pinprick were assessed and were symmetrical.    Coordination - The patient had normal movements in the hands with no ataxia or dysmetria.  Tremor was absent.  Gait and Station - deferred.   ASSESSMENT/PLAN Matthew Francis is a 60 y.o. male with history of ulcerative colitis, HTN, HLD, DM, CKD, lone A. fib admitted for headache, confusion, nausea vomiting. No tPA given due to  ICH. Overnight 12/14/2020, more confused and STAT CT head showed stable hemorrhage and  increase in ventricular size including temporal horns. Neurosurgery evaluated and determined hydrocephalus would not cause episodic symptoms and EEG did not show epileptiform discharges. Repeat CT head 12/15/2020 showed Improved hydrocephalus. He is currently AAOx3.  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 CT head 12/15/2020 stable hematoma and stable ventricular size MRI with and without contrast unchanged hematoma, no underlying mass MRA unremarkable, no aneurysm or AVM Carotid Doppler unremarkable 2D Echo EF 60 to 65% LDL 166 HgbA1c 5.9 SCDs for VTE prophylaxis No antithrombotic prior to admission, now on No antithrombotic given ICH Patient counseled to be compliant with his antithrombotic medications Ongoing aggressive stroke risk factor management Therapy recommendations:  none Disposition:  pending   IVH CT  showed IVH right lateral ventricle, third ventricle and partial fourth ventricle Overnight 12/14/2020, more confused, STAT CT head stable hemorrhage and mild hydrocephalus. Neurosurgery opted no EVD. Repeat CT head 12/15/2020 showed Improved hydrocephalus. LTM EEG did not reveal epileptiform activity. CT repeat 7/1 some improvement of III ventricle Improved HA which was likely related to IVH and blood products  Short term oxycodone PRN - Hold. Continue to monitor.  Ulcerative colitis Frequent diarrhea Follows with GI at [facility] Recently prescribed Xifaxan but not able to afford the cost Restart Xifaxan this time as inpt On loperamide  Diabetes HgbA1c 5.9, goal < 7.0 Controlled CBG monitoring SSI DM education and close PCP follow up  Hypertension Home meds: Hydralazine, Norvasc and atenolol BP goal less than 160 On hydralazine 100 bid and Norvasc Long term BP goal normotensive  Hyperlipidemia Home meds: Crestor 10 every other day and Zetia LDL 166, goal < 70 Now on Crestor 20 daily Continue statin and Zetia at discharge  Cocaine abuse UDS positive for cocaine Avoid selective beta-blockers including metoprolol, atenolol Cessation education provided Patient is willing to quit  AKI creatinine 1.31-> 1.17->1.50->1.39 likely due to dehydration from diarrhea, which is better today Encourage po intake Management per primary team  Other Stroke Risk Factors Obesity  Cocaine abuse  Other Active Problems Lone A. fib in 01/2020 in the setting of sepsis Brief episode of asymptomatic bradycardia and remained alert with BP, RR, Oxygen saturation within normal reference range. Currently stable and will need follow up with cardiology.  Hx of opioids abuse.  Continues to c/o severe lower back spasms which radiate into lower extremities causing severe pain. Increased baclofen to 60m three times daily and will consider magnesium 8078mdaily.   Discussed with critical care team and the  patient is stable and ready to transfer to intermediate care on to hospitalist service. Transfer order placed.  Hospital day # 7 Continue mobilize out of bed and therapy consults.  Continue strict control of hypertension with systolic goal below 16614atient counseled to quit cocaine and to compliant with his blood pressure medications.  Patient has been transferred to medical hospitalist service..  Discussed with Dr. PrBerle MullMD. discussed with family member at the bedside and answered questions.  Follow-up as an outpatient in the stroke clinic in 6 weeks.  Stroke team will sign off.  Kindly call for questions. Greater than 50% time during this 25-minute visit was spent in counseling and coordination of care and discussion with care team about his intracerebral hemorrhage.   PrAntony ContrasMD 12/18/2020 2:52 PM  To contact Stroke  Continuity provider, please refer to [email or web address] After hours, contact General Neurology

## 2020-12-18 NOTE — Progress Notes (Signed)
RN was administering patient's scheduled lactulose, patient took a few sips of the medication (1/4 of the cup) and then refused to take any more. RN charted patient refused on the [facility].

## 2020-12-18 NOTE — Progress Notes (Signed)
[facility] Progress Note  Patient: Matthew Francis    [id number]  DOA: 12/11/2020     Date of Service: the patient was seen and examined on 12/18/2020  Brief hospital course: Past medical history of HTN, HLD, arthritis, GERD, headache.  Presents with complaints of confusion.  Found to have hemorrhagic stroke. Patient was admitted to the ICU under neurology service. PCCM was consulted for further management. Transferred to hospitalist service on 7/4. Currently plan is treat agitation and monitor blood pressure.  Subjective: Uncomfortable in the time of my evaluation but in between per RN patient is significantly agitated.  Pulled out IV and telemetry.  Constantly trying to get out of the bed.  Assessment and Plan: 1.  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 CT head shows a stable hematoma on 7/4. MRI and MRA unremarkable without any aneurysm. Carotid Doppler unremarkable. Echocardiogram shows preserved EF 60 to 65%. Currently continue SCD for VT prophylaxis. No antithrombotic. Controlled blood pressure.  Goal blood pressure normal. EEG long-term did not reveal any seizure-like activity. Repeat CT scan actually shows improved hydrocephalus.  2.  Acute metabolic encephalopathy Likely multifactorial. Suspect that patient was recently started on baclofen for his back pain which I think is causing his further agitation. Having to discontinue this medication.  Patient was on Seroquel, placed on Risperdal now. Switch to Robaxin for pain control. As needed Haldol. Currently using restraint for next 24 hours.  3.  Malignant hypertension Cocaine abuse Avoid beta-blockers. Currently on Norvasc, clonidine, hydralazine,  4.  Acute kidney injury Renal function mildly worsening. Improved with IV fluids.  Monitor.  5.  Ulcerative colitis history Continue home regimen for  now  6.  Type 2 diabetes mellitus, controlled.  No complication.  No long-term insulin use. Hemoglobin A1c 5.9. Continue sliding scale insulin.  7.  HLD. Will continue Crestor.  8.  Cocaine abuse. Will counsel the patient to stop abusing, Once more amenable for conversation Currently patient encephalopathic.  Scheduled Meds:  amLODipine  10 mg Oral Daily   budesonide  9 mg Oral Daily   cloNIDine  0.1 mg Oral Daily   hydrALAZINE  100 mg Oral BID   insulin aspart  0-9 Units Subcutaneous TID AC & HS   lactulose  20 g Oral Daily   multivitamin with minerals  1 tablet Oral Daily   pantoprazole  40 mg Oral Daily   rifaximin  550 mg Oral TID   risperiDONE  1 mg Oral BID   Vitamin D (Ergocalciferol)  50,000 Units Oral Q Wed   Continuous Infusions:  cefTRIAXone (ROCEPHIN)  IV 2 g (12/18/20 1224)   PRN Meds: acetaminophen, haloperidol lactate, hydrALAZINE, methocarbamol, ondansetron (ZOFRAN) IV, white petrolatum  Body mass index is 27.43 kg/m.        DVT Prophylaxis:   SCD's Start: 12/12/20 0101    Advance goals of care discussion: Pt is Full code.  Family Communication: no family was present at bedside, at the time of interview.   Data Reviewed: I have personally reviewed and interpreted daily labs, tele strips, imaging. Hypokalemia still present renal function mildly worsening.  Physical Exam:  General: Appear in mild distress, no Rash; Oral Mucosa Clear, moist. no Abnormal Neck Mass Or lumps, Conjunctiva normal  Cardiovascular: S1 and S2 Present, no Murmur, Respiratory: good respiratory effort, Bilateral Air entry present and CTA, no Crackles, no wheezes Abdomen: Bowel Sound present, Soft and no tenderness Extremities:  no Pedal edema Neurology: alert and oriented to place, and person affect appropriate. no new focal deficit Gait not checked due to patient safety concerns   Vitals:   12/17/20 2329 12/18/20 0332 12/18/20 0911 12/18/20 1200  BP: 123/79 132/82 122/83  125/83  Pulse: 71 65 87 85  Resp: 17 18 18 18   Temp: 98.9 F (37.2 C) 98.8 F (37.1 C) 97.7 F (36.5 C) 98.4 F (36.9 C)  TempSrc: Oral Oral Oral Oral  SpO2: 99% 99% 100% 99%  Weight:      Height:        Disposition:  Status is: Inpatient  Remains inpatient appropriate because:Altered mental status and IV treatments appropriate due to intensity of illness or inability to take PO  Dispo: The patient is from: Home              Anticipated d/c is to: Home              Patient currently is not medically stable to d/c.   Difficult to place patient No  Time spent: 35 minutes. I reviewed all nursing notes, pharmacy notes, vitals, pertinent old records. I have discussed plan of care as described above with RN.  Author: Berle Mull, MD [facility] 12/18/2020 7:05 PM  To reach On-call, see care teams to locate the attending and reach out via [email or web address]. Between 7PM-7AM, please contact night-coverage If you still have difficulty reaching the attending provider, please page the [facility] (Director on Call) for [facility] on amion for assistance.

## 2020-12-18 NOTE — Progress Notes (Signed)
PHARMACY - PHYSICIAN COMMUNICATION CRITICAL VALUE ALERT - BLOOD CULTURE IDENTIFICATION (BCID)  Matthew Francis is an 60 y.o. male who presented to [facility] on 12/11/2020 found to have GNR in one blood culture on 7/3.  BCID did not identify any organisms  Assessment:  GNR found in one blood culture from 7/3.  Currently not on systemic antibiotics.  Would recommend treating this as we wait for ID and sensitivities  Name of physician (or Provider) Contacted: Dr. Posey Pronto  Current antibiotics: Rifaxamin (on PTA for colotis)  Changes to prescribed antibiotics recommended: Start ceftriaxone 2g IV daily Recommendations accepted by provider  Results for orders placed or performed during the hospital encounter of 12/11/20  Blood Culture ID Panel (Reflexed) (Collected: 12/16/2020 11:50 PM)  Result Value Ref Range   Enterococcus faecalis NOT DETECTED NOT DETECTED   Enterococcus Faecium NOT DETECTED NOT DETECTED   Listeria monocytogenes NOT DETECTED NOT DETECTED   Staphylococcus species NOT DETECTED NOT DETECTED   Staphylococcus aureus (BCID) NOT DETECTED NOT DETECTED   Staphylococcus epidermidis NOT DETECTED NOT DETECTED   Staphylococcus lugdunensis NOT DETECTED NOT DETECTED   Streptococcus species NOT DETECTED NOT DETECTED   Streptococcus agalactiae NOT DETECTED NOT DETECTED   Streptococcus pneumoniae NOT DETECTED NOT DETECTED   Streptococcus pyogenes NOT DETECTED NOT DETECTED   A.calcoaceticus-baumannii NOT DETECTED NOT DETECTED   Bacteroides fragilis NOT DETECTED NOT DETECTED   Enterobacterales NOT DETECTED NOT DETECTED   Enterobacter cloacae complex NOT DETECTED NOT DETECTED   Escherichia coli NOT DETECTED NOT DETECTED   Klebsiella aerogenes NOT DETECTED NOT DETECTED   Klebsiella oxytoca NOT DETECTED NOT DETECTED   Klebsiella pneumoniae NOT DETECTED NOT DETECTED   Proteus species NOT DETECTED NOT DETECTED   Salmonella species NOT DETECTED NOT DETECTED   Serratia marcescens NOT DETECTED NOT  DETECTED   Haemophilus influenzae NOT DETECTED NOT DETECTED   Neisseria meningitidis NOT DETECTED NOT DETECTED   Pseudomonas aeruginosa NOT DETECTED NOT DETECTED   Stenotrophomonas maltophilia NOT DETECTED NOT DETECTED   Candida albicans NOT DETECTED NOT DETECTED   Candida auris NOT DETECTED NOT DETECTED   Candida glabrata NOT DETECTED NOT DETECTED   Candida krusei NOT DETECTED NOT DETECTED   Candida parapsilosis NOT DETECTED NOT DETECTED   Candida tropicalis NOT DETECTED NOT DETECTED   Cryptococcus neoformans/gattii NOT DETECTED NOT DETECTED    [name] 12/18/2020  11:12 AM

## 2020-12-19 LAB — GLUCOSE, CAPILLARY
Glucose-Capillary: 151 mg/dL — ABNORMAL HIGH (ref 70–99)
Glucose-Capillary: 180 mg/dL — ABNORMAL HIGH (ref 70–99)
Glucose-Capillary: 165 mg/dL — ABNORMAL HIGH (ref 70–99)
Glucose-Capillary: 129 mg/dL — ABNORMAL HIGH (ref 70–99)

## 2020-12-19 MED ORDER — WHITE PETROLATUM EX OINT
TOPICAL_OINTMENT | CUTANEOUS | Status: AC
  Filled 2020-12-19: qty 28.35

## 2020-12-19 MED ORDER — RIFAXIMIN 550 MG PO TABS
550.0000 mg | ORAL_TABLET | Freq: Two times a day (BID) | ORAL | Status: DC
  Administered 2020-12-19 – 2020-12-21 (×4): 550 mg via ORAL
  Filled 2020-12-19 (×4): qty 1

## 2020-12-19 NOTE — Progress Notes (Signed)
Physical Therapy Treatment Patient Details Name: Matthew Francis MRN: [id number] DOB: 1960/10/20 Today's Date: 12/19/2020    History of Present Illness Pt is a 60 y.o. male who presented 6/28 with AMS, nausea, and headache. 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 PMH: arthritis, GERD, HTN, inguinal hernia, and ulcerative colitis.    PT Comments    Initially with decreased arousal to verbal or noxious stimuli. Awoken with positional changes, RN states behavioral in nature. Patient required minA for sit to stand transfer with HHAx1. Patient ambulated 200' with minA and no AD, +2 for safety due to behavior. Mimicking knee buckling during ambulation which was behavioral and not due to weakness. Continue to recommend comprehensive inpatient rehab (CIR) for post-acute therapy needs due to functional decline. Potential to reach modI level goals with intensive therapies.     Follow Up Recommendations  CIR;Supervision/Assistance - 24 hour     Equipment Recommendations  Other (comment) (TBD)    Recommendations for Other Services       Precautions / Restrictions Precautions Precautions: Other (comment) Precaution Comments: SBP < 140, behavior Restrictions Weight Bearing Restrictions: No    Mobility  Bed Mobility Overal bed mobility: Needs Assistance Bed Mobility: Supine to Sit;Sit to Supine     Supine to sit: Max assist;+2 for physical assistance Sit to supine: Min guard   General bed mobility comments: maxA+2 due to decreased arousal. min guard to return to supine    Transfers Overall transfer level: Needs assistance Equipment used: 1 person hand held assist Transfers: Sit to/from Stand Sit to Stand: Min assist         General transfer comment: minA to rise and steady. +2 preferred for safety due to impulsivity  Ambulation/Gait Ambulation/Gait assistance: Min assist;+2 safety/equipment Gait  Distance (Feet): 200 Feet Assistive device: None Gait Pattern/deviations: Step-through pattern;Decreased stride length;Drifts right/left;Wide base of support     General Gait Details: minA for balance. Patient mimicking knee buckling which was behavioral in nature not due to weakness. +2 for safety for behavior. Demos weakness and unsteadiness on L side   Stairs             Wheelchair Mobility    Modified Rankin (Stroke Patients Only) Modified Rankin (Stroke Patients Only) Pre-Morbid Rankin Score: No symptoms Modified Rankin: Moderately severe disability     Balance Overall balance assessment: Needs assistance Sitting-balance support: Feet supported;Bilateral upper extremity supported Sitting balance-Leahy Scale: Fair     Standing balance support: No upper extremity supported;During functional activity Standing balance-Leahy Scale: Fair Standing balance comment: minA for balance during ambulation                            Cognition Arousal/Alertness: Lethargic;Awake/alert Behavior During Therapy: Impulsive Overall Cognitive Status: Impaired/Different from baseline Area of Impairment: Attention;Memory;Following commands;Safety/judgement;Awareness;Problem solving                   Current Attention Level: Sustained Memory: Decreased short-term memory Following Commands: Follows one step commands inconsistently;Follows one step commands with increased time Safety/Judgement: Decreased awareness of safety;Decreased awareness of deficits Awareness: Intellectual Problem Solving: Difficulty sequencing;Requires verbal cues;Requires tactile cues General Comments: initially lethargic, not responding to noxious or audible stimuli. Awoken with positional changes. RN states this is behavioral and has been doing all day. STM deficits with providing room number and patient not recalling after 5 seconds.      Exercises  General Comments         Pertinent Vitals/Pain Pain Assessment: Faces Faces Pain Scale: Hurts little more Pain Location: L LE Pain Descriptors / Indicators: Grimacing Pain Intervention(s): Monitored during session    Home Living                      Prior Function            PT Goals (current goals can now be found in the care plan section) Acute Rehab PT Goals Patient Stated Goal: none stated PT Goal Formulation: Patient unable to participate in goal setting Time For Goal Achievement: 01/01/21 Potential to Achieve Goals: Good Progress towards PT goals: Progressing toward goals    Frequency    Min 4X/week      PT Plan Current plan remains appropriate    Co-evaluation              AM-PAC PT "6 Clicks" Mobility   Outcome Measure  Help needed turning from your back to your side while in a flat bed without using bedrails?: Total Help needed moving from lying on your back to sitting on the side of a flat bed without using bedrails?: Total Help needed moving to and from a bed to a chair (including a wheelchair)?: Total Help needed standing up from a chair using your arms (e.g., wheelchair or bedside chair)?: Total Help needed to walk in hospital room?: Total Help needed climbing 3-5 steps with a railing? : Total 6 Click Score: 6    End of Session Equipment Utilized During Treatment: Gait belt Activity Tolerance: Patient tolerated treatment well Patient left: in bed;with call bell/phone within reach;with nursing/sitter in room Nurse Communication: Mobility status PT Visit Diagnosis: Muscle weakness (generalized) (M62.81);Other symptoms and signs involving the nervous system (R29.898);Difficulty in walking, not elsewhere classified (R26.2)     Time: 1520-1550 PT Time Calculation (min) (ACUTE ONLY): 30 min  Charges:  $Gait Training: 8-22 mins                     Chyanne Kohut A. Gilford Rile PT, DPT Acute Rehabilitation Services Pager [phone] Office [phone]    Linna Hoff 12/19/2020, 4:07 PM

## 2020-12-19 NOTE — Plan of Care (Signed)
  Problem: Education: Goal: Knowledge of disease or condition will improve Outcome: Progressing Goal: Knowledge of secondary prevention will improve Outcome: Progressing Goal: Knowledge of patient specific risk factors addressed and post discharge goals established will improve Outcome: Progressing   Problem: Coping: Goal: Will verbalize positive feelings about self Outcome: Progressing Goal: Will identify appropriate support needs Outcome: Progressing   Problem: Health Behavior/Discharge Planning: Goal: Ability to manage health-related needs will improve Outcome: Progressing   Problem: Self-Care: Goal: Ability to participate in self-care as condition permits will improve Outcome: Progressing   Problem: Intracerebral Hemorrhage Tissue Perfusion: Goal: Complications of Intracerebral Hemorrhage will be minimized Outcome: Progressing   Problem: Safety: Goal: Non-violent Restraint(s) Outcome: Progressing   Problem: Education: Goal: Knowledge of disease or condition will improve Outcome: Progressing Goal: Understanding of discharge needs will improve Outcome: Progressing   Problem: Health Behavior/Discharge Planning: Goal: Ability to identify changes in lifestyle to reduce recurrence of condition will improve Outcome: Progressing Goal: Identification of resources available to assist in meeting health care needs will improve Outcome: Progressing   Problem: Physical Regulation: Goal: Complications related to the disease process, condition or treatment will be avoided or minimized Outcome: Progressing   Problem: Safety: Goal: Ability to remain free from injury will improve Outcome: Progressing

## 2020-12-19 NOTE — Progress Notes (Signed)
Inpatient Rehab Admissions Coordinator Note:   Per therapy recommendations, pt was screened for CIR candidacy by Clemens Catholic, [place] CCC-SLP. At this time, Pt. Appears to have functional decline and is a potential  candidate for CIR. Will place order for rehab consult per protocol.  Please contact me with questions.   Clemens Catholic, [place], [place] Admissions Coordinator  [phone] ([place]) [phone] (office)

## 2020-12-19 NOTE — Plan of Care (Signed)
  Problem: Education: Goal: Knowledge of disease or condition will improve Outcome: Progressing Goal: Knowledge of secondary prevention will improve Outcome: Progressing Goal: Knowledge of patient specific risk factors addressed and post discharge goals established will improve Outcome: Progressing   Problem: Coping: Goal: Will verbalize positive feelings about self Outcome: Progressing Goal: Will identify appropriate support needs Outcome: Progressing   Problem: Safety: Goal: Non-violent Restraint(s) Outcome: Progressing   Problem: Education: Goal: Knowledge of disease or condition will improve Outcome: Progressing Goal: Understanding of discharge needs will improve Outcome: Progressing

## 2020-12-19 NOTE — Evaluation (Signed)
Occupational Therapy Evaluation Patient Details Name: Matthew Francis MRN: [id number] DOB: 27-Nov-1960 Today's Date: 12/19/2020    History of Present Illness Pt is a 60 y.o. male who presented 6/28 with AMS, nausea, and headache. 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 PMH: arthritis, GERD, HTN, inguinal hernia, and ulcerative colitis.   Clinical Impression   Patient admitted for the diagnosis above.  He was assessed and discharged a week ago at his baseline.  Patient with significant change in status, now needing a sitter with mild behavioral issue, not physical, will just ignore staff unless the task is something he wants to do.  PTA it appears he has assist as needed from friends and family, currently he is needing up to [place] for mobility, and Min Guard at a sitting level for ADL completion.  CIR has been recommended, but OT will follow him in the acute setting to maximize his functional status.       Follow Up Recommendations  CIR    Equipment Recommendations  None recommended by OT    Recommendations for Other Services       Precautions / Restrictions Precautions Precautions: Other (comment);Fall Precaution Comments: SBP < 140, behavior Restrictions Weight Bearing Restrictions: No      Mobility Bed Mobility Overal bed mobility: Needs Assistance Bed Mobility: Supine to Sit;Sit to Supine     Supine to sit: Max assist;+2 for physical assistance Sit to supine: Min guard   General bed mobility comments: maxA+2 due to decreased arousal. min guard to return to supine Patient Response: Cooperative  Transfers Overall transfer level: Needs assistance Equipment used: 1 person hand held assist Transfers: Sit to/from [employer] Sit to Stand: Min assist   Squat pivot transfers: Min guard     General transfer comment: minA to rise and steady. +2 preferred for safety due to  impulsivity    Balance Overall balance assessment: Needs assistance Sitting-balance support: Feet supported;Bilateral upper extremity supported Sitting balance-Leahy Scale: Fair     Standing balance support: No upper extremity supported;During functional activity Standing balance-Leahy Scale: Fair Standing balance comment: minA for balance during ambulation                           ADL either performed or assessed with clinical judgement   ADL Overall ADL's : Needs assistance/impaired     Grooming: Oral care;Standing;Min guard           Upper Body Dressing : Min guard;Sitting   Lower Body Dressing: Min guard;Sitting/lateral leans               Functional mobility during ADLs: Min guard       Vision Patient Visual Report: No change from baseline       Perception     Praxis      Pertinent Vitals/Pain Pain Assessment: Faces Faces Pain Scale: Hurts little more Pain Location: L LE Pain Descriptors / Indicators: Grimacing Pain Intervention(s): Monitored during session     Hand Dominance Right   Extremity/Trunk Assessment Upper Extremity Assessment Upper Extremity Assessment: Generalized weakness   Lower Extremity Assessment Lower Extremity Assessment: Defer to PT evaluation   Cervical / Trunk Assessment Cervical / Trunk Assessment: Normal   Communication Communication Communication: No difficulties   Cognition Arousal/Alertness: Awake/alert Behavior During Therapy: Impulsive Overall Cognitive Status: Impaired/Different from baseline Area of Impairment: Attention;Memory;Following commands;Safety/judgement;Awareness;Problem solving  Orientation Level: Disoriented to;Situation Current Attention Level: Sustained Memory: Decreased short-term memory Following Commands: Follows one step commands with increased time Safety/Judgement: Decreased awareness of safety;Decreased awareness of deficits Awareness:  Intellectual Problem Solving: Difficulty sequencing;Requires verbal cues;Requires tactile cues;Slow processing General Comments: Behavioral issues   General Comments       Exercises     Shoulder Instructions      Home Living Family/patient expects to be discharged to:: Private residence Living Arrangements: Alone Available Help at Discharge: Friend(s);Available 24 hours/day Type of Home: House Home Access: Stairs to enter [employer] of Steps: 1 Entrance Stairs-Rails: None Home Layout: One level     Bathroom Shower/Tub: Teacher, early years/pre: Standard     Home Equipment: None          Prior Functioning/Environment Level of Independence: Independent        Comments: Works at [employer] for [employer].        OT Problem List: Decreased strength;Decreased activity tolerance;Impaired balance (sitting and/or standing);Decreased safety awareness;Decreased cognition;Pain      OT Treatment/Interventions: Self-care/ADL training;Therapeutic exercise;Balance training;Patient/family education;Therapeutic activities;Cognitive remediation/compensation    OT Goals(Current goals can be found in the care plan section) Acute Rehab OT Goals Patient Stated Goal: I don't know, go home OT Goal Formulation: With patient Time For Goal Achievement: 01/02/21 Potential to Achieve Goals: Good ADL Goals Pt Will Perform Grooming: with set-up;standing Pt Will Perform Lower Body Bathing: with set-up;sit to/from stand Pt Will Perform Lower Body Dressing: with set-up;sit to/from stand Pt Will Transfer to Toilet: with set-up;ambulating;regular height toilet Pt Will Perform Toileting - Clothing Manipulation and hygiene: with set-up;sit to/from stand  OT Frequency: Min 2X/week   Barriers to D/C:    none noted       Co-evaluation              AM-PAC OT "6 Clicks" Daily Activity     Outcome Measure Help from another person eating meals?:  None Help from another person taking care of personal grooming?: A Little Help from another person toileting, which includes using toliet, bedpan, or urinal?: A Little Help from another person bathing (including washing, rinsing, drying)?: A Little Help from another person to put on and taking off regular upper body clothing?: A Little Help from another person to put on and taking off regular lower body clothing?: A Little 6 Click Score: 19   End of Session Equipment Utilized During Treatment: Gait belt Nurse Communication: Mobility status  Activity Tolerance: Patient tolerated treatment well Patient left: in bed;with call bell/phone within reach;with nursing/sitter in room  OT Visit Diagnosis: Unsteadiness on feet (R26.81);Pain;Other symptoms and signs involving cognitive function Pain - Right/Left: Left Pain - part of body: Leg                Time: 6734-1937 OT Time Calculation (min): 23 min Charges:  OT General Charges $OT Visit: 1 Visit OT Evaluation $OT Eval Moderate Complexity: 1 Mod  12/19/2020  Rich, OTR/L  Acute Rehabilitation Services  Office:  [place] 12/19/2020, 4:22 PM

## 2020-12-19 NOTE — Progress Notes (Signed)
[facility] Progress Note  Patient: Matthew Francis    [id number]  DOA: 12/11/2020     Date of Service: the patient was seen and examined on 12/19/2020  Brief hospital course: Past medical history of HTN, HLD, arthritis, GERD, headache.  Presents with complaints of confusion.  Found to have hemorrhagic stroke. Patient was admitted to the ICU under neurology service. PCCM was consulted for further management. Transferred to hospitalist service on 7/4. Currently plan is treat agitation and monitor blood pressure.  Subjective: No nausea no vomiting.  No fever no chills.  Overnight remain significantly agitated requiring restraints.  No diarrhea no constipation reported.  Assessment and Plan: 1.  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 CT head shows a stable hematoma on 7/4. MRI and MRA unremarkable without any aneurysm. Carotid Doppler unremarkable. Echocardiogram shows preserved EF 60 to 65%. Currently continue SCD for VT prophylaxis. No antithrombotic. Controlled blood pressure.  Goal blood pressure normal. EEG long-term did not reveal any seizure-like activity. Repeat CT scan actually shows improved hydrocephalus. Neurology currently signed off  2.  Acute metabolic encephalopathy Likely multifactorial. Suspect that patient was recently started on baclofen for his back pain which I think is causing his further agitation. Having to discontinue this medication.  Patient was on Seroquel, placed on Risperdal now. Switch to Robaxin for pain control. As needed Haldol. Will discontinue restraints as of 7/6.  3.  Malignant hypertension Cocaine abuse Avoid beta-blockers. Currently on Norvasc, clonidine, hydralazine,  4.  Acute kidney injury Renal function mildly worsening. Improved with IV fluids.  Monitor.  5.  Ulcerative colitis history Continue home regimen for  now  6.  Type 2 diabetes mellitus, controlled.  No complication.  No long-term insulin use. Hemoglobin A1c 5.9. Continue sliding scale insulin.  7.  HLD. Will continue Crestor.  8.  Cocaine abuse. Will counsel the patient to stop abusing, Once more amenable for conversation Currently patient encephalopathic.  Scheduled Meds:  amLODipine  10 mg Oral Daily   budesonide  9 mg Oral Daily   cloNIDine  0.1 mg Oral Daily   hydrALAZINE  100 mg Oral BID   insulin aspart  0-9 Units Subcutaneous TID AC & HS   lactulose  20 g Oral Daily   multivitamin with minerals  1 tablet Oral Daily   pantoprazole  40 mg Oral Daily   rifaximin  550 mg Oral BID   risperiDONE  1 mg Oral BID   Vitamin D (Ergocalciferol)  50,000 Units Oral Q Wed   Continuous Infusions:  cefTRIAXone (ROCEPHIN)  IV 2 g (12/19/20 1109)   PRN Meds: acetaminophen, haloperidol lactate, hydrALAZINE, methocarbamol, ondansetron (ZOFRAN) IV, white petrolatum  Body mass index is 27.43 kg/m.        DVT Prophylaxis:   SCD's Start: 12/12/20 0101    Advance goals of care discussion: Pt is Full code.  Family Communication: no family was present at bedside, at the time of interview.   Data Reviewed: I have personally reviewed and interpreted daily labs, tele strips, imaging. CBC mildly elevated.  Physical Exam:  General: Appear in mild distress; no visible Abnormal Neck Mass Or lumps, Conjunctiva normal Cardiovascular: S1 and S2 Present, aortic systolic  Murmur, Respiratory: good respiratory effort, Bilateral Air entry present and CTA, no Crackles, no wheezes Abdomen: Bowel Sound present Extremities: no Pedal edema Neurology: lethargic and not oriented to time, place, and person Gait not checked due to  patient safety concerns  Vitals:   12/19/20 0853 12/19/20 1114 12/19/20 1135 12/19/20 1551  BP: (!) 126/92 138/70 104/70 120/88  Pulse: (!) 104 90 95 (!) 105  Resp: 18 20 18 20   Temp: 98.5 F (36.9 C) 98.4 F (36.9  C) 98.6 F (37 C) 97.9 F (36.6 C)  TempSrc: Oral Oral Oral Oral  SpO2: 100% 100% 96% 99%  Weight:      Height:        Disposition:  Status is: Inpatient  Remains inpatient appropriate because:Altered mental status and IV treatments appropriate due to intensity of illness or inability to take PO  Dispo: The patient is from: Home              Anticipated d/c is to: Home              Patient currently is not medically stable to d/c.   Difficult to place patient No  Time spent: 35 minutes. I reviewed all nursing notes, pharmacy notes, vitals, pertinent old records. I have discussed plan of care as described above with RN.  Author: Berle Mull, MD [facility] 12/19/2020 6:18 PM  To reach On-call, see care teams to locate the attending and reach out via [email or web address]. Between 7PM-7AM, please contact night-coverage If you still have difficulty reaching the attending provider, please page the [facility] (Director on Call) for [facility] on amion for assistance.

## 2020-12-20 LAB — GLUCOSE, CAPILLARY
Glucose-Capillary: 157 mg/dL — ABNORMAL HIGH (ref 70–99)
Glucose-Capillary: 154 mg/dL — ABNORMAL HIGH (ref 70–99)
Glucose-Capillary: 146 mg/dL — ABNORMAL HIGH (ref 70–99)
Glucose-Capillary: 136 mg/dL — ABNORMAL HIGH (ref 70–99)

## 2020-12-20 NOTE — Progress Notes (Signed)
Inpatient Rehabilitation Admissions Coordinator   Inpatient rehab consult received . I met with patient at bedside for assessment. We discussed goals and expectations of a possible CIR admit . He will need to be Mod I at d/c which therapy feels is appropriate. Patient is quite tangential in conversation. I will begin insurance Auth for a possible Cir admit.  Danne Baxter, RN, MSN Rehab Admissions Coordinator [phone] 12/20/2020 2:51 PM

## 2020-12-20 NOTE — Progress Notes (Signed)
[facility] Progress Note  Patient: Matthew Francis    [id number]  DOA: 12/11/2020     Date of Service: the patient was seen and examined on 12/20/2020  Brief hospital course: Past medical history of HTN, HLD, arthritis, GERD, headache.  Presents with complaints of confusion.  Found to have hemorrhagic stroke.   6/28 presents with headache, CT head shows acute hemorrhage in right caudate, basal ganglia and hemorrhage on right lateral, third and fourth ventricle without midline shift. 7/2 neurosurgery consulted recommends no indication for EVD. 7/4 transferred to [facility] service.  Persistent encephalopathy.  Patient was admitted to the ICU under neurology service. PCCM was consulted for further management.  Currently plan is treat encephalopathy.  Subjective: Agitation appears to have resolved.  The patient is able to answer questions appropriately although remains confused and delusional.  No acute complaint.  No nausea no vomiting but no fever no chills.  No acute event overnight.  Assessment and Plan: 1.  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 CT head shows a stable hematoma on 7/4. Repeat CT scan actually shows improved hydrocephalus. MRI and MRA unremarkable without any aneurysm. Carotid Doppler unremarkable. Echocardiogram shows preserved EF 60 to 65%. Currently continue SCD for VT prophylaxis. No antithrombotic. Controlled blood pressure.  Goal blood pressure normal. Neurology currently signed off  2.  Acute metabolic encephalopathy, ongoing Likely multifactorial. Suspect that patient was recently started on baclofen for his back pain which I think is causing his further agitation. Having to discontinue this medication.   Patient was on Seroquel, placed on Risperdal now. Switch to Robaxin for pain  control. As needed Haldol. Restraints discontinued as of 7/6 morning. Currently appears to be delusional bowel pleasant.  Monitor for now.  May require psychiatric consultation if does not improve.  3.  Malignant hypertension Cocaine abuse Avoid beta-blockers. Currently on Norvasc, clonidine, hydralazine,  4.  Acute kidney injury Hypokalemia Renal function mildly worsening.  Potassium level corrected. Improved with IV fluids.  Monitor.  5.  Ulcerative colitis history Continue home regimen for now  6.  Type 2 diabetes mellitus, controlled.  No complication.  No long-term insulin use. Hemoglobin A1c 5.9. Continue sliding scale insulin.  7.  HLD. Will continue Crestor.  8.  Cocaine abuse. Will counsel the patient to stop abusing, Once more amenable for conversation Currently patient encephalopathic.  Scheduled Meds:  amLODipine  10 mg Oral Daily   budesonide  9 mg Oral Daily   cloNIDine  0.1 mg Oral Daily   hydrALAZINE  100 mg Oral BID   insulin aspart  0-9 Units Subcutaneous TID AC & HS   multivitamin with minerals  1 tablet Oral Daily   pantoprazole  40 mg Oral Daily   rifaximin  550 mg Oral BID   risperiDONE  1 mg Oral BID   Vitamin D (Ergocalciferol)  50,000 Units Oral Q Wed   Continuous Infusions:  cefTRIAXone (ROCEPHIN)  IV 2 g (12/20/20 1048)   PRN Meds: acetaminophen, haloperidol lactate, hydrALAZINE, methocarbamol, ondansetron (ZOFRAN) IV, white petrolatum  Body mass index is 27.43 kg/m.        DVT Prophylaxis:   SCD's Start: 12/12/20 0101    Advance goals of care discussion: Pt is Full code.  Family Communication: no family was present at bedside, at the time of interview.   Data Reviewed: I have personally reviewed and  interpreted daily labs, tele strips, imaging. CBC mildly elevated.   Physical Exam:  General: Appear in mild distress, no Rash; Oral Mucosa Clear, moist. no Abnormal Neck Mass Or lumps, Conjunctiva normal  Cardiovascular: S1 and  S2 Present, no Murmur, Respiratory: good respiratory effort, Bilateral Air entry present and CTA, no Crackles, no wheezes Abdomen: Bowel Sound present, Soft and no tenderness Extremities: no Pedal edema Neurology: alert and oriented to time, place, and person but chronologically appears to be confused. affect appropriate. no new focal deficit Gait not checked due to patient safety concerns   Vitals:   12/20/20 0412 12/20/20 0724 12/20/20 1235 12/20/20 1740  BP: 117/64 135/81 (!) 109/57 (!) 126/93  Pulse: 92 94 88 71  Resp: 17 18 18 18   Temp: 98.1 F (36.7 C) 97.9 F (36.6 C) 98.1 F (36.7 C) 97.9 F (36.6 C)  TempSrc: Oral Oral Oral Oral  SpO2: 97% 100% 99% 100%  Weight:      Height:        Disposition:  Status is: Inpatient  Remains inpatient appropriate because:Altered mental status and IV treatments appropriate due to intensity of illness or inability to take PO  Dispo: The patient is from: Home              Anticipated d/c is to: Home              Patient currently is not medically stable to d/c.   Difficult to place patient No  Time spent: 35 minutes. I reviewed all nursing notes, pharmacy notes, vitals, pertinent old records. I have discussed plan of care as described above with RN.  Author: Berle Mull, MD [facility] 12/20/2020 6:45 PM  To reach On-call, see care teams to locate the attending and reach out via [email or web address]. Between 7PM-7AM, please contact night-coverage If you still have difficulty reaching the attending provider, please page the [facility] (Director on Call) for [facility] on amion for assistance.

## 2020-12-20 NOTE — Plan of Care (Signed)
  Problem: Education: Goal: Knowledge of disease or condition will improve Outcome: Progressing Goal: Knowledge of secondary prevention will improve Outcome: Progressing Goal: Knowledge of patient specific risk factors addressed and post discharge goals established will improve Outcome: Progressing   Problem: Coping: Goal: Will verbalize positive feelings about self Outcome: Progressing Goal: Will identify appropriate support needs Outcome: Progressing   Problem: Health Behavior/Discharge Planning: Goal: Ability to manage health-related needs will improve Outcome: Progressing   Problem: Self-Care: Goal: Ability to participate in self-care as condition permits will improve Outcome: Progressing   Problem: Intracerebral Hemorrhage Tissue Perfusion: Goal: Complications of Intracerebral Hemorrhage will be minimized Outcome: Progressing   Problem: Safety: Goal: Non-violent Restraint(s) Outcome: Progressing   Problem: Education: Goal: Knowledge of disease or condition will improve Outcome: Progressing Goal: Understanding of discharge needs will improve Outcome: Progressing   Problem: Health Behavior/Discharge Planning: Goal: Ability to identify changes in lifestyle to reduce recurrence of condition will improve Outcome: Progressing Goal: Identification of resources available to assist in meeting health care needs will improve Outcome: Progressing   Problem: Physical Regulation: Goal: Complications related to the disease process, condition or treatment will be avoided or minimized Outcome: Progressing   Problem: Safety: Goal: Ability to remain free from injury will improve Outcome: Progressing

## 2020-12-20 NOTE — Progress Notes (Signed)
Physical Therapy Treatment Patient Details Name: Matthew Francis MRN: [id number] DOB: Feb 17, 1961 Today's Date: 12/20/2020    History of Present Illness Pt is a 60 y.o. male who presented 6/28 with AMS, nausea, and headache. 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 PMH: arthritis, GERD, HTN, inguinal hernia, and ulcerative colitis.    PT Comments    Patient continues to be limited by impaired cognition, weakness, and balance deficits. Patient disoriented to place and tangential in conversation throughout session. Patient with complaints of back pain upon standing with forward flexed posture and requesting use of RW for ambulation. Improved posture noted. Required minA for ambulation with RW for balance and safety, moments of min guard. Continue to recommend comprehensive inpatient rehab (CIR) for post-acute therapy needs.     Follow Up Recommendations  CIR     Equipment Recommendations  Other (comment) (TBD)    Recommendations for Other Services       Precautions / Restrictions Precautions Precautions: Other (comment);Fall Precaution Comments: SBP < 140, behavior Restrictions Weight Bearing Restrictions: No    Mobility  Bed Mobility Overal bed mobility: Needs Assistance Bed Mobility: Supine to Sit;Sit to Supine     Supine to sit: Min assist Sit to supine: Min assist   General bed mobility comments: minA to return LEs into bed and trunk elevation to EOB    Transfers Overall transfer level: Needs assistance Equipment used: Rolling [place] Catarino (2 wheeled) Transfers: Sit to/from Stand Sit to Stand: Min assist         General transfer comment: minA to rise and steady from low surface. Complains of back pain upon standing requiring RW  Ambulation/Gait Ambulation/Gait assistance: Min assist;Min guard Gait Distance (Feet): 200 Feet Assistive device: Rolling Shilpa Bushee (2 wheeled) Gait  Pattern/deviations: Step-through pattern;Decreased stride length;Drifts right/left;Wide base of support     General Gait Details: Use of RW this date due to increased back pain upon standing and patient with forward flexed posture without AD. Improved posture with RW present. MinA for balance, moments of min guard   Stairs             Wheelchair Mobility    Modified Rankin (Stroke Patients Only) Modified Rankin (Stroke Patients Only) Pre-Morbid Rankin Score: No symptoms Modified Rankin: Moderately severe disability     Balance Overall balance assessment: Needs assistance Sitting-balance support: Feet supported;Bilateral upper extremity supported Sitting balance-Leahy Scale: Good     Standing balance support: Bilateral upper extremity supported;During functional activity Standing balance-Leahy Scale: Poor Standing balance comment: reliant on external assist                            Cognition Arousal/Alertness: Awake/alert Behavior During Therapy: WFL for tasks assessed/performed Overall Cognitive Status: Impaired/Different from baseline Area of Impairment: Orientation;Attention;Memory;Following commands;Safety/judgement;Awareness;Problem solving                 Orientation Level: Disoriented to;Place Current Attention Level: Sustained Memory: Decreased short-term memory Following Commands: Follows one step commands with increased time Safety/Judgement: Decreased awareness of safety;Decreased awareness of deficits Awareness: Intellectual Problem Solving: Difficulty sequencing;Requires verbal cues;Requires tactile cues;Slow processing General Comments: Disoriented to place this session, requiring 2 attempts to reorient. STM deficits noted with providing room number and engaged in conversation during ambulation, patient unable to recall correct room number or identify room with familiar objects present      Exercises  General Comments         Pertinent Vitals/Pain Pain Assessment: Faces Faces Pain Scale: Hurts even more Pain Location: back Pain Descriptors / Indicators: Grimacing Pain Intervention(s): Monitored during session;Repositioned    Home Living                      Prior Function            PT Goals (current goals can now be found in the care plan section) Acute Rehab PT Goals Patient Stated Goal: I don't know, go home PT Goal Formulation: With patient Time For Goal Achievement: 01/01/21 Potential to Achieve Goals: Good Progress towards PT goals: Progressing toward goals    Frequency    Min 4X/week      PT Plan Current plan remains appropriate    Co-evaluation              AM-PAC PT "6 Clicks" Mobility   Outcome Measure  Help needed turning from your back to your side while in a flat bed without using bedrails?: A Little Help needed moving from lying on your back to sitting on the side of a flat bed without using bedrails?: A Little Help needed moving to and from a bed to a chair (including a wheelchair)?: A Little Help needed standing up from a chair using your arms (e.g., wheelchair or bedside chair)?: A Little Help needed to walk in hospital room?: A Little Help needed climbing 3-5 steps with a railing? : A Little 6 Click Score: 18    End of Session Equipment Utilized During Treatment: Gait belt Activity Tolerance: Patient tolerated treatment well Patient left: in bed;with call bell/phone within reach;with bed alarm set Nurse Communication: Mobility status PT Visit Diagnosis: Muscle weakness (generalized) (M62.81);Other symptoms and signs involving the nervous system (R29.898);Difficulty in walking, not elsewhere classified (R26.2)     Time: 0086-7619 PT Time Calculation (min) (ACUTE ONLY): 25 min  Charges:  $Gait Training: 23-37 mins                     Tashawn Greff A. Gilford Rile PT, DPT Acute Rehabilitation Services Pager [phone] Office [phone]    Linna Hoff 12/20/2020, 5:19 PM

## 2020-12-21 ENCOUNTER — Inpatient Hospital Stay (HOSPITAL_COMMUNITY)
Admission: RE | Admit: 2020-12-21 | Discharge: 2021-01-01 | DRG: 057 | Disposition: A | Discharge status: Home / Self Care | Payer: 59 | Source: Intra-hospital | Attending: Physical Medicine & Rehabilitation | Admitting: Physical Medicine & Rehabilitation

## 2020-12-21 ENCOUNTER — Encounter (HOSPITAL_COMMUNITY): Payer: Self-pay | Admitting: Physical Medicine & Rehabilitation

## 2020-12-21 ENCOUNTER — Other Ambulatory Visit: Payer: Self-pay

## 2020-12-21 DIAGNOSIS — Z888 Allergy status to other drugs, medicaments and biological substances status: Secondary | ICD-10-CM

## 2020-12-21 DIAGNOSIS — E119 Type 2 diabetes mellitus without complications: Secondary | ICD-10-CM | POA: Diagnosis present

## 2020-12-21 DIAGNOSIS — Z6827 Body mass index (BMI) 27.0-27.9, adult: Secondary | ICD-10-CM

## 2020-12-21 DIAGNOSIS — Z882 Allergy status to sulfonamides status: Secondary | ICD-10-CM | POA: Diagnosis not present

## 2020-12-21 DIAGNOSIS — I610 Nontraumatic intracerebral hemorrhage in hemisphere, subcortical: Secondary | ICD-10-CM

## 2020-12-21 DIAGNOSIS — I69111 Memory deficit following nontraumatic intracerebral hemorrhage: Secondary | ICD-10-CM | POA: Diagnosis not present

## 2020-12-21 DIAGNOSIS — Z91030 Bee allergy status: Secondary | ICD-10-CM

## 2020-12-21 DIAGNOSIS — F1910 Other psychoactive substance abuse, uncomplicated: Secondary | ICD-10-CM | POA: Diagnosis present

## 2020-12-21 DIAGNOSIS — I10 Essential (primary) hypertension: Secondary | ICD-10-CM

## 2020-12-21 DIAGNOSIS — E785 Hyperlipidemia, unspecified: Secondary | ICD-10-CM | POA: Diagnosis present

## 2020-12-21 DIAGNOSIS — E871 Hypo-osmolality and hyponatremia: Secondary | ICD-10-CM | POA: Diagnosis not present

## 2020-12-21 DIAGNOSIS — I69190 Apraxia following nontraumatic intracerebral hemorrhage: Secondary | ICD-10-CM | POA: Diagnosis present

## 2020-12-21 DIAGNOSIS — K219 Gastro-esophageal reflux disease without esophagitis: Secondary | ICD-10-CM | POA: Diagnosis present

## 2020-12-21 DIAGNOSIS — F1490 Cocaine use, unspecified, uncomplicated: Secondary | ICD-10-CM | POA: Diagnosis present

## 2020-12-21 DIAGNOSIS — N179 Acute kidney failure, unspecified: Secondary | ICD-10-CM

## 2020-12-21 DIAGNOSIS — Z9049 Acquired absence of other specified parts of digestive tract: Secondary | ICD-10-CM | POA: Diagnosis not present

## 2020-12-21 DIAGNOSIS — I129 Hypertensive chronic kidney disease with stage 1 through stage 4 chronic kidney disease, or unspecified chronic kidney disease: Secondary | ICD-10-CM | POA: Diagnosis present

## 2020-12-21 DIAGNOSIS — G910 Communicating hydrocephalus: Secondary | ICD-10-CM | POA: Diagnosis present

## 2020-12-21 DIAGNOSIS — G4700 Insomnia, unspecified: Secondary | ICD-10-CM | POA: Diagnosis present

## 2020-12-21 DIAGNOSIS — E1122 Type 2 diabetes mellitus with diabetic chronic kidney disease: Secondary | ICD-10-CM | POA: Diagnosis present

## 2020-12-21 DIAGNOSIS — R0981 Nasal congestion: Secondary | ICD-10-CM | POA: Diagnosis not present

## 2020-12-21 DIAGNOSIS — E669 Obesity, unspecified: Secondary | ICD-10-CM | POA: Diagnosis present

## 2020-12-21 DIAGNOSIS — E876 Hypokalemia: Secondary | ICD-10-CM | POA: Diagnosis not present

## 2020-12-21 DIAGNOSIS — N182 Chronic kidney disease, stage 2 (mild): Secondary | ICD-10-CM | POA: Diagnosis present

## 2020-12-21 DIAGNOSIS — I6910 Unspecified sequelae of nontraumatic intracerebral hemorrhage: Secondary | ICD-10-CM

## 2020-12-21 DIAGNOSIS — Z79899 Other long term (current) drug therapy: Secondary | ICD-10-CM

## 2020-12-21 DIAGNOSIS — Z880 Allergy status to penicillin: Secondary | ICD-10-CM | POA: Diagnosis not present

## 2020-12-21 DIAGNOSIS — R451 Restlessness and agitation: Secondary | ICD-10-CM | POA: Diagnosis present

## 2020-12-21 DIAGNOSIS — I161 Hypertensive emergency: Secondary | ICD-10-CM | POA: Diagnosis present

## 2020-12-21 DIAGNOSIS — K5190 Ulcerative colitis, unspecified, without complications: Secondary | ICD-10-CM | POA: Diagnosis present

## 2020-12-21 DIAGNOSIS — R5383 Other fatigue: Secondary | ICD-10-CM | POA: Diagnosis present

## 2020-12-21 DIAGNOSIS — R4587 Impulsiveness: Secondary | ICD-10-CM | POA: Diagnosis present

## 2020-12-21 DIAGNOSIS — I619 Nontraumatic intracerebral hemorrhage, unspecified: Secondary | ICD-10-CM | POA: Diagnosis present

## 2020-12-21 LAB — GLUCOSE, CAPILLARY
Glucose-Capillary: 139 mg/dL — ABNORMAL HIGH (ref 70–99)
Glucose-Capillary: 161 mg/dL — ABNORMAL HIGH (ref 70–99)
Glucose-Capillary: 144 mg/dL — ABNORMAL HIGH (ref 70–99)
Glucose-Capillary: 128 mg/dL — ABNORMAL HIGH (ref 70–99)

## 2020-12-21 LAB — CULTURE, BLOOD (ROUTINE X 2)

## 2020-12-21 MED ORDER — BUDESONIDE 3 MG PO CPEP
9.0000 mg | ORAL_CAPSULE | Freq: Every day | ORAL | Status: DC
  Administered 2020-12-22 – 2021-01-01 (×11): 9 mg via ORAL
  Filled 2020-12-21 (×11): qty 3

## 2020-12-21 MED ORDER — HYDROCODONE-ACETAMINOPHEN 5-325 MG PO TABS
1.0000 | ORAL_TABLET | Freq: Four times a day (QID) | ORAL | Status: DC | PRN
  Administered 2020-12-21: 1 via ORAL
  Filled 2020-12-21: qty 1

## 2020-12-21 MED ORDER — INSULIN ASPART 100 UNIT/ML IJ SOLN
0.0000 [IU] | Freq: Three times a day (TID) | INTRAMUSCULAR | Status: DC
  Administered 2020-12-21 (×2): 1 [IU] via SUBCUTANEOUS
  Administered 2020-12-22 (×2): 2 [IU] via SUBCUTANEOUS
  Administered 2020-12-22 – 2020-12-23 (×2): 1 [IU] via SUBCUTANEOUS
  Administered 2020-12-23: 2 [IU] via SUBCUTANEOUS
  Administered 2020-12-23: 1 [IU] via SUBCUTANEOUS
  Administered 2020-12-24: 3 [IU] via SUBCUTANEOUS
  Administered 2020-12-24: 1 [IU] via SUBCUTANEOUS
  Administered 2020-12-25: 2 [IU] via SUBCUTANEOUS
  Administered 2020-12-25 – 2020-12-26 (×4): 1 [IU] via SUBCUTANEOUS
  Administered 2020-12-26: 2 [IU] via SUBCUTANEOUS
  Administered 2020-12-27 – 2020-12-28 (×3): 1 [IU] via SUBCUTANEOUS

## 2020-12-21 MED ORDER — HYDROCODONE-ACETAMINOPHEN 5-325 MG PO TABS: 1.0000 | ORAL_TABLET | Freq: Four times a day (QID) | ORAL | Status: DC | PRN

## 2020-12-21 MED ORDER — CLONIDINE HCL 0.1 MG PO TABS
0.1000 mg | ORAL_TABLET | Freq: Every day | ORAL | Status: DC
  Administered 2020-12-22 – 2021-01-01 (×11): 0.1 mg via ORAL
  Filled 2020-12-21 (×11): qty 1

## 2020-12-21 MED ORDER — RISPERIDONE 1 MG PO TABS
1.0000 mg | ORAL_TABLET | Freq: Two times a day (BID) | ORAL | Status: DC
  Administered 2020-12-21 – 2020-12-28 (×14): 1 mg via ORAL
  Filled 2020-12-21 (×14): qty 1

## 2020-12-21 MED ORDER — CLONIDINE HCL 0.1 MG PO TABS
0.1000 mg | ORAL_TABLET | Freq: Every day | ORAL | Status: DC
  Administered 2020-12-21: 0.1 mg via ORAL
  Filled 2020-12-21: qty 1

## 2020-12-21 MED ORDER — RIFAXIMIN 550 MG PO TABS
550.0000 mg | ORAL_TABLET | Freq: Two times a day (BID) | ORAL | Status: DC
  Administered 2020-12-21 – 2021-01-01 (×22): 550 mg via ORAL
  Filled 2020-12-21 (×22): qty 1

## 2020-12-21 MED ORDER — LORAZEPAM 0.5 MG PO TABS
0.5000 mg | ORAL_TABLET | Freq: Once | ORAL | Status: AC
  Administered 2020-12-21: 0.5 mg via ORAL
  Filled 2020-12-21: qty 1

## 2020-12-21 MED ORDER — HYDRALAZINE HCL 100 MG PO TABS: 100.0000 mg | ORAL_TABLET | Freq: Two times a day (BID) | ORAL | Status: DC

## 2020-12-21 MED ORDER — HYDRALAZINE HCL 50 MG PO TABS
100.0000 mg | ORAL_TABLET | Freq: Two times a day (BID) | ORAL | Status: DC
  Administered 2020-12-21: 100 mg via ORAL
  Filled 2020-12-21: qty 2

## 2020-12-21 MED ORDER — RISPERIDONE 1 MG PO TABS: 1.0000 mg | ORAL_TABLET | Freq: Two times a day (BID) | ORAL | Status: DC

## 2020-12-21 MED ORDER — ACETAMINOPHEN 325 MG PO TABS
650.0000 mg | ORAL_TABLET | Freq: Four times a day (QID) | ORAL | Status: DC | PRN
  Administered 2020-12-21 – 2021-01-01 (×19): 650 mg via ORAL
  Filled 2020-12-21 (×18): qty 2

## 2020-12-21 MED ORDER — AMLODIPINE BESYLATE 10 MG PO TABS
10.0000 mg | ORAL_TABLET | Freq: Every day | ORAL | Status: DC
  Administered 2020-12-22 – 2021-01-01 (×11): 10 mg via ORAL
  Filled 2020-12-21 (×11): qty 1

## 2020-12-21 MED ORDER — PANTOPRAZOLE SODIUM 40 MG PO TBEC
40.0000 mg | DELAYED_RELEASE_TABLET | Freq: Every day | ORAL | Status: DC
  Administered 2020-12-22 – 2020-12-26 (×5): 40 mg via ORAL
  Filled 2020-12-21 (×5): qty 1

## 2020-12-21 MED ORDER — AMLODIPINE BESYLATE 10 MG PO TABS
10.0000 mg | ORAL_TABLET | Freq: Every day | ORAL | Status: DC
  Administered 2020-12-21: 10 mg via ORAL
  Filled 2020-12-21: qty 1

## 2020-12-21 MED ORDER — CLONIDINE HCL 0.1 MG PO TABS: 0.1000 mg | ORAL_TABLET | Freq: Every day | ORAL | Status: DC

## 2020-12-21 MED ORDER — VITAMIN D (ERGOCALCIFEROL) 1.25 MG (50000 UNIT) PO CAPS
50000.0000 [IU] | ORAL_CAPSULE | ORAL | Status: DC
  Administered 2020-12-26: 50000 [IU] via ORAL
  Filled 2020-12-21: qty 1

## 2020-12-21 MED ORDER — WHITE PETROLATUM EX OINT
TOPICAL_OINTMENT | CUTANEOUS | Status: DC | PRN
  Filled 2020-12-21 (×2): qty 28.35

## 2020-12-21 MED ORDER — RIFAXIMIN 550 MG PO TABS: 550.0000 mg | ORAL_TABLET | Freq: Two times a day (BID) | ORAL | Status: DC

## 2020-12-21 MED ORDER — METHOCARBAMOL 500 MG PO TABS
500.0000 mg | ORAL_TABLET | Freq: Three times a day (TID) | ORAL | Status: DC | PRN
  Administered 2020-12-21 – 2020-12-31 (×8): 500 mg via ORAL
  Filled 2020-12-21 (×9): qty 1

## 2020-12-21 MED ORDER — HYDRALAZINE HCL 50 MG PO TABS
100.0000 mg | ORAL_TABLET | Freq: Two times a day (BID) | ORAL | Status: DC
  Administered 2020-12-21 – 2021-01-01 (×22): 100 mg via ORAL
  Filled 2020-12-21 (×22): qty 2

## 2020-12-21 MED ORDER — SIMETHICONE 80 MG PO CHEW
80.0000 mg | CHEWABLE_TABLET | Freq: Four times a day (QID) | ORAL | Status: DC
  Administered 2020-12-21 – 2021-01-01 (×38): 80 mg via ORAL
  Filled 2020-12-21 (×37): qty 1

## 2020-12-21 MED ORDER — METHOCARBAMOL 500 MG PO TABS: 500.0000 mg | ORAL_TABLET | Freq: Three times a day (TID) | ORAL | Status: DC | PRN

## 2020-12-21 MED ORDER — ADULT MULTIVITAMIN W/MINERALS CH
1.0000 | ORAL_TABLET | Freq: Every day | ORAL | Status: DC
  Administered 2020-12-22 – 2021-01-01 (×11): 1 via ORAL
  Filled 2020-12-21 (×11): qty 1

## 2020-12-21 NOTE — H&P (Signed)
Physical Medicine and Rehabilitation Admission H&P        Chief Complaint  Patient presents with   Headache   Hypertension  : HPI: Matthew Francis is a 60 year old right-handed male history of hypertension, obesity with BMI 27.43, ulcerative colitis as well as hyperlipidemia.  Per chart review patient lives alone.  Independent prior to admission works at [employer].  1 level home one-step to entry.  He does have family in the [place] area.  Presented 12/11/2020 with headache as well as nausea and vomiting.  Admission chemistries unremarkable aside glucose 125, creatinine 1.31, hemoglobin A1c 5.9, urine drug screen positive cocaine, ammonia level 39.  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  No midline shift.  MRA was unremarkable.  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  Echocardiogram with ejection fraction of 60 to 81% grade 1 diastolic dysfunction.  EEG negative for seizure.  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  Dr. Kathyrn Sheriff follow-up for right caudate hemorrhage as well as intraventricular extension question hydrocephalus no current plan for EVD.  He is tolerating a regular consistency diet.  Blood culture 12/16/2020 gram-negative rods found in the 1 culture.  He was placed on Rocephin empirically and awaiting plan for duration of antibiotic.  Patient with intermittent bouts of restlessness and agitation placed on Risperdal.  Therapy evaluations completed due to patient's decreased functional ability altered mental status was admitted for a comprehensive rehab program.   Review of  Systems Constitutional:  Negative for chills and fever. HENT:  Negative for hearing loss.   Eyes:  Negative for blurred vision and double vision. Respiratory:  Negative for cough and shortness of breath.   Cardiovascular:  Positive for leg swelling. Negative for chest pain and palpitations. Gastrointestinal:  Positive for constipation, nausea and vomiting. Negative for heartburn. Genitourinary:  Negative for dysuria, flank pain and hematuria. Musculoskeletal:  Positive for joint pain and myalgias. Skin:  Negative for rash. Neurological:  Positive for dizziness, weakness and headaches.  All other systems reviewed and are negative.     Past Medical History:  Diagnosis Date   Arthritis     GERD (gastroesophageal reflux disease)     H/O ulcerative colitis     Hyperlipidemia     Hypertension     Inguinal hernia     Ulcer      ulcerative colitis   Ulcerative colitis     Wears glasses           Past Surgical History:  Procedure Laterality Date   APPENDECTOMY       COLECTOMY        Proctocolectomy with IPAA   COLECTOMY       FOREIGN BODY REMOVAL ABDOMINAL   02/17/2012    Procedure: REMOVAL FOREIGN BODY ABDOMINAL;  Surgeon: Odis Hollingshead, MD;  Location: [facility];  Service: General;  Laterality: N/A;  abdominal wound exploration and removal of foreign body   HERNIA REPAIR        Multiple incisional hernias.   ileoanal anastomosis             Family History  Problem Relation Age of Onset   Cancer Sister  Social History:  reports that he has never smoked. He has never used smokeless tobacco. He reports current alcohol use of about 4.0 standard drinks of alcohol per week. He reports that he does not use drugs. Allergies:       Allergies  Allergen Reactions   Asacol [Mesalamine] Swelling      Throat     Penicillins Anaphylaxis      Has patient had a PCN reaction causing immediate rash, facial/tongue/throat swelling, SOB or lightheadedness with  hypotension: Yes Has patient had a PCN reaction causing severe rash involving mucus membranes or skin necrosis: No Has patient had a PCN reaction that required hospitalization: Yes Has patient had a PCN reaction occurring within the last 10 years: No If all of the above answers are "NO", then may proceed with Cephalosporin use.   Sulfa Antibiotics Anaphylaxis   Atorvastatin Other (See Comments)      Myalgia, muscle cramps   Bee Pollen Hives   Bee Venom Hives and Swelling   Iodinated Diagnostic Agents        Pt reports allergy at [facility] [place]. Rapid heartbeat and dyspnea          Medications Prior to Admission  Medication Sig Dispense Refill   acetaminophen (TYLENOL) 500 MG tablet Take 500 mg by mouth every 6 (six) hours as needed for mild pain.       amLODipine (NORVASC) 10 MG tablet Take 10 mg by mouth daily.   2   ascorbic acid (VITAMIN C) 500 MG tablet Take 500 mg by mouth daily.       atenolol (TENORMIN) 25 MG tablet Take 25 mg by mouth daily.       budesonide (ENTOCORT EC) 3 MG 24 hr capsule Take 9 mg by mouth daily.       cetirizine (ZYRTEC) 10 MG tablet Take 10 mg by mouth daily as needed for allergies.       Cholecalciferol (VITAMIN D3) 50000 units CAPS Take 50,000 Units by mouth every Wednesday.   0   dicyclomine (BENTYL) 10 MG capsule Take 10 mg by mouth every 6 (six) hours as needed for spasms.       esomeprazole (NEXIUM) 20 MG capsule Take 20 mg by mouth daily.       ezetimibe (ZETIA) 10 MG tablet Take 10 mg by mouth daily.       gabapentin (NEURONTIN) 300 MG capsule Take 300 mg by mouth 3 (three) times daily as needed (nerve pain).       Glucosamine HCl (GLUCOSAMINE PO) Take 1 tablet by mouth daily.       hydrALAZINE (APRESOLINE) 25 MG tablet Take 25 mg by mouth 3 (three) times daily.       hydrALAZINE (APRESOLINE) 50 MG tablet Take 50 mg by mouth in the morning and at bedtime.       loperamide (IMODIUM A-D) 2 MG tablet Take 2 mg by mouth 2 (two) times daily. (Do not exceed  more than 4 tablets in 24 hours)       Multiple Vitamin (MULTIVITAMIN WITH MINERALS) TABS Take 1 tablet by mouth daily.       Omega-3 Fatty Acids (FISH OIL) 1000 MG CAPS Take 1 capsule by mouth daily.       psyllium (METAMUCIL) 58.6 % packet Take 1 packet by mouth daily as needed (For fiber/thickener).       rosuvastatin (CRESTOR) 10 MG tablet Take 10 mg by mouth every other day.  Turmeric (QC TUMERIC COMPLEX PO) Take 1 tablet by mouth daily as needed (For inflammatory).       vitamin B-12 (CYANOCOBALAMIN) 100 MCG tablet Take 100 mcg by mouth daily.       XIFAXAN 550 MG TABS tablet Take 550 mg by mouth 3 (three) times daily.       VICTOZA 18 MG/3ML SOPN Inject 1.8 mg into the skin daily. (Patient not taking: No sig reported)          Drug Regimen Review Drug regimen was reviewed and remains appropriate with no significant issues identified   Home: Home Living Family/patient expects to be discharged to:: Private residence Living Arrangements: Alone Available Help at Discharge: Friend(s), Available 24 hours/day Type of Home: House Home Access: Stairs to enter [employer] of Steps: 1 Entrance Stairs-Rails: None Home Layout: One level Bathroom Shower/Tub: Chiropodist: Standard Home Equipment: None   Functional History: Prior Function Level of Independence: Independent Comments: Works at [employer].   Functional Status:  Mobility: Bed Mobility Overal bed mobility: Needs Assistance Bed Mobility: Supine to Sit, Sit to Supine Supine to sit: Min assist Sit to supine: Min assist General bed mobility comments: minA to return LEs into bed and trunk elevation to EOB Transfers Overall transfer level: Needs assistance Equipment used: Rolling walker (2 wheeled) Transfers: Sit to/from Stand Sit to Stand: Min assist Squat pivot transfers: Min guard General transfer comment: minA to rise and steady from low surface.  Complains of back pain upon standing requiring RW Ambulation/Gait Ambulation/Gait assistance: Min assist, Min guard Gait Distance (Feet): 200 Feet Assistive device: Rolling walker (2 wheeled) Gait Pattern/deviations: Step-through pattern, Decreased stride length, Drifts right/left, Wide base of support General Gait Details: Use of RW this date due to increased back pain upon standing and patient with forward flexed posture without AD. Improved posture with RW present. MinA for balance, moments of min guard Gait velocity: WNL Gait velocity interpretation: <1.31 ft/sec, indicative of household ambulator   ADL: ADL Overall ADL's : Needs assistance/impaired Grooming: Oral care, Standing, Min guard Upper Body Dressing : Min guard, Sitting Lower Body Dressing: Min guard, Sitting/lateral leans Functional mobility during ADLs: Min guard   Cognition: Cognition Overall Cognitive Status: Impaired/Different from baseline Orientation Level: Oriented X4 Attention: Sustained, Focused Sustained Attention: Impaired Sustained Attention Impairment: Functional basic, Verbal basic Memory: Impaired Memory Impairment: Storage deficit (did not recall use of call bell - which button to push, etc) Awareness: Impaired (trying to get OOB although he requires significant assistance) Problem Solving: Impaired (trying to get OOB) Problem Solving Impairment: Functional basic Cognition Arousal/Alertness: Awake/alert Behavior During Therapy: WFL for tasks assessed/performed Overall Cognitive Status: Impaired/Different from baseline Area of Impairment: Orientation, Attention, Memory, Following commands, Safety/judgement, Awareness, Problem solving Orientation Level: Disoriented to, Place Current Attention Level: Sustained Memory: Decreased short-term memory Following Commands: Follows one step commands with increased time Safety/Judgement: Decreased awareness of safety, Decreased awareness of  deficits Awareness: Intellectual Problem Solving: Difficulty sequencing, Requires verbal cues, Requires tactile cues, Slow processing General Comments: Disoriented to place this session, requiring 2 attempts to reorient. STM deficits noted with providing room number and engaged in conversation during ambulation, patient unable to recall correct room number or identify room with familiar objects present   Physical Exam: Blood pressure 129/87, pulse (!) 104, temperature 98.2 F (36.8 C), temperature source Oral, resp. rate 20, height 5' 11"  (1.803 m), weight 89.2 kg, SpO2 98 %. Physical Exam Constitutional:  General: He is not in acute distress. HENT:    Head: Normocephalic.    Mouth/Throat:    Mouth: Mucous membranes are moist. Eyes:    Extraocular Movements: Extraocular movements intact. Cardiovascular:    Rate and Rhythm: Regular rhythm. Tachycardia present.    Heart sounds: No murmur heard. Pulmonary:    Effort: Pulmonary effort is normal. No respiratory distress.    Breath sounds: No wheezing. Abdominal:    General: There is distension.    Tenderness: There is no abdominal tenderness. Musculoskeletal:        General: No tenderness. Normal range of motion.    Cervical back: Normal range of motion. Skin:    General: Skin is warm and dry. Neurological:    Mental Status: He is alert.    Comments: Patient is alert sitting at bedside.  He is a bit restless.  Makes eye contact with examiner.  Provides name and age.  Oriented to month, day of week, year, day with cues. STM deficits. Impaired insight and awareness. Could provide some biographical information. Moves all 4 limbs. Mild left PD and decreased FMC/apraxia LUE and LLE. Senses pain and LT in all 4's. No focal CN findings.   Psychiatric:    Comments: A little restless, impulsive      Lab Results Last 48 Hours        Results for orders placed or performed during the hospital encounter of 12/11/20 (from the past 48  hour(s))  Glucose, capillary     Status: Abnormal    Collection Time: 12/19/20 11:55 AM  Result Value Ref Range    Glucose-Capillary 180 (H) 70 - 99 mg/dL      Comment: Glucose reference range applies only to samples taken after fasting for at least 8 hours.  Glucose, capillary     Status: Abnormal    Collection Time: 12/19/20  3:32 PM  Result Value Ref Range    Glucose-Capillary 165 (H) 70 - 99 mg/dL      Comment: Glucose reference range applies only to samples taken after fasting for at least 8 hours.  Glucose, capillary     Status: Abnormal    Collection Time: 12/19/20  9:11 PM  Result Value Ref Range    Glucose-Capillary 129 (H) 70 - 99 mg/dL      Comment: Glucose reference range applies only to samples taken after fasting for at least 8 hours.  Glucose, capillary     Status: Abnormal    Collection Time: 12/20/20  6:23 AM  Result Value Ref Range    Glucose-Capillary 157 (H) 70 - 99 mg/dL      Comment: Glucose reference range applies only to samples taken after fasting for at least 8 hours.  Glucose, capillary     Status: Abnormal    Collection Time: 12/20/20 12:32 PM  Result Value Ref Range    Glucose-Capillary 154 (H) 70 - 99 mg/dL      Comment: Glucose reference range applies only to samples taken after fasting for at least 8 hours.  Glucose, capillary     Status: Abnormal    Collection Time: 12/20/20  4:53 PM  Result Value Ref Range    Glucose-Capillary 146 (H) 70 - 99 mg/dL      Comment: Glucose reference range applies only to samples taken after fasting for at least 8 hours.  Glucose, capillary     Status: Abnormal    Collection Time: 12/20/20  9:01 PM  Result Value Ref Range    Glucose-Capillary  136 (H) 70 - 99 mg/dL      Comment: Glucose reference range applies only to samples taken after fasting for at least 8 hours.  Glucose, capillary     Status: Abnormal    Collection Time: 12/21/20  6:20 AM  Result Value Ref Range    Glucose-Capillary 139 (H) 70 - 99 mg/dL       Comment: Glucose reference range applies only to samples taken after fasting for at least 8 hours.      Imaging Results (Last 48 hours)  No results found.           Medical Problem List and Plan: 1.  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  Antithrombotics: -DVT/anticoagulation: SCDs             -antiplatelet therapy: N/A 3. Pain Management: Robaxin as needed, Tylenol as needed 4. Mood: Provide emotional support             -antipsychotic agents: Risperdal 1 mg twice daily 5. Neuropsych: This patient is not capable of making decisions on his own behalf. 6. Skin/Wound Care: Routine skin checks 7. Fluids/Electrolytes/Nutrition: Routine in and outs with follow-up chemistries on admit 8.  Hypertension.  Clonidine 01 mg daily, Norvasc 10 mg daily, hydralazine 100 mg twice daily.  Monitor with increased mobility             -BP under fair control at present 9.  Ulcerative colitis.Xifaxan 550 mg twice daily, Entocort 9 mg daily             -pt having a lot of gas currently causing abdominal discomfort/distention             -will schedule simethicone for now 10.  GNR found 1 blood culture 12/16/2020.  Placed on empiric Rocephin 12/18/2020             -cx + diptheroids---dc abx, spoke with pharmacy 11.  AKI.  Follow-up chemistries 12.  Type 2 diabetes mellitus.  Hemoglobin A1c 5.9.  SSI.       Lavon Paganini Angiulli, PA-C 12/21/2020   I have personally performed a face to face diagnostic evaluation of this patient and formulated the key components of the plan.  Additionally, I have personally reviewed laboratory data, imaging studies, as well as relevant notes and concur with the physician assistant's documentation above.  The patient's status has not changed from the original H&P.  Any changes in  documentation from the acute care chart have been noted above.  Meredith Staggers, MD, [facility]

## 2020-12-21 NOTE — H&P (Signed)
Physical Medicine and Rehabilitation Admission H&P    Chief Complaint  Patient presents with   Headache   Hypertension  : HPI: Matthew Francis is a 60 year old right-handed male history of hypertension, obesity with BMI 27.43, ulcerative colitis as well as hyperlipidemia.  Per chart review patient lives alone.  Independent prior to admission works at [employer].  1 level home one-step to entry.  He does have family in the [place] area.  Presented 12/11/2020 with headache as well as nausea and vomiting.  Admission chemistries unremarkable aside glucose 125, creatinine 1.31, hemoglobin A1c 5.9, urine drug screen positive cocaine, ammonia level 39.  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  No midline shift.  MRA was unremarkable.  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  Echocardiogram with ejection fraction of 60 to 41% grade 1 diastolic dysfunction.  EEG negative for seizure.  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  Dr. Kathyrn Sheriff follow-up for right caudate hemorrhage as well as intraventricular extension question hydrocephalus no current plan for EVD.  He is tolerating a regular consistency diet.  Blood culture 12/16/2020 gram-negative rods found in the 1 culture.  He was placed on Rocephin empirically and awaiting plan for duration of antibiotic.  Patient with intermittent bouts of restlessness and agitation placed on Risperdal.  Therapy evaluations completed due to patient's decreased functional ability altered mental status was admitted for a comprehensive rehab program.  Review of Systems   Constitutional:  Negative for chills and fever.  HENT:  Negative for hearing loss.   Eyes:  Negative for blurred vision and double vision.  Respiratory:  Negative for cough and shortness of breath.   Cardiovascular:  Positive for leg swelling. Negative for chest pain and palpitations.  Gastrointestinal:  Positive for constipation, nausea and vomiting. Negative for heartburn.  Genitourinary:  Negative for dysuria, flank pain and hematuria.  Musculoskeletal:  Positive for joint pain and myalgias.  Skin:  Negative for rash.  Neurological:  Positive for dizziness, weakness and headaches.  All other systems reviewed and are negative. Past Medical History:  Diagnosis Date   Arthritis    GERD (gastroesophageal reflux disease)    H/O ulcerative colitis    Hyperlipidemia    Hypertension    Inguinal hernia    Ulcer    ulcerative colitis   Ulcerative colitis    Wears glasses    Past Surgical History:  Procedure Laterality Date   APPENDECTOMY     COLECTOMY     Proctocolectomy with IPAA   COLECTOMY     FOREIGN BODY REMOVAL ABDOMINAL  02/17/2012   Procedure: REMOVAL FOREIGN BODY ABDOMINAL;  Surgeon: Odis Hollingshead, MD;  Location: [facility];  Service: General;  Laterality: N/A;  abdominal wound exploration and removal of foreign body   HERNIA REPAIR     Multiple incisional hernias.   ileoanal anastomosis     Family History  Problem Relation Age of Onset   Cancer Sister    Social History:  reports that he has never smoked. He has never used smokeless tobacco. He reports current alcohol use of about 4.0 standard drinks of alcohol per week. He reports that he does not use drugs. Allergies:  Allergies  Allergen Reactions   Asacol [Mesalamine] Swelling    Throat    Penicillins Anaphylaxis    Has patient had a PCN reaction causing immediate rash, facial/tongue/throat swelling, SOB or lightheadedness with hypotension: Yes Has patient had a PCN reaction causing severe  rash involving mucus membranes or skin necrosis: No Has patient had a PCN reaction that required hospitalization: Yes Has patient had a PCN reaction occurring within the last 10 years: No If all of the above answers are "NO", then may proceed with Cephalosporin use.   Sulfa Antibiotics Anaphylaxis   Atorvastatin Other (See Comments)    Myalgia, muscle cramps   Bee Pollen Hives   Bee Venom Hives and Swelling   Iodinated Diagnostic Agents     Pt reports allergy at [place]. Rapid heartbeat and dyspnea   Medications Prior to Admission  Medication Sig Dispense Refill   acetaminophen (TYLENOL) 500 MG tablet Take 500 mg by mouth every 6 (six) hours as needed for mild pain.      amLODipine (NORVASC) 10 MG tablet Take 10 mg by mouth daily.   2   ascorbic acid (VITAMIN C) 500 MG tablet Take 500 mg by mouth daily.     atenolol (TENORMIN) 25 MG tablet Take 25 mg by mouth daily.      budesonide (ENTOCORT EC) 3 MG 24 hr capsule Take 9 mg by mouth daily.     cetirizine (ZYRTEC) 10 MG tablet Take 10 mg by mouth daily as needed for allergies.      Cholecalciferol (VITAMIN D3) 50000 units CAPS Take 50,000 Units by mouth every Wednesday.   0   dicyclomine (BENTYL) 10 MG capsule Take 10 mg by mouth every 6 (six) hours as needed for spasms.     esomeprazole (NEXIUM) 20 MG capsule Take 20 mg by mouth daily.     ezetimibe (ZETIA) 10 MG tablet Take 10 mg by mouth daily.     gabapentin (NEURONTIN) 300 MG capsule Take 300 mg by mouth 3 (three) times daily as needed (nerve pain).      Glucosamine HCl (GLUCOSAMINE PO) Take 1 tablet by mouth daily.     hydrALAZINE (APRESOLINE) 25 MG tablet Take 25 mg by mouth 3 (three) times daily.     hydrALAZINE (APRESOLINE) 50 MG tablet Take 50 mg by mouth in the morning and at bedtime.      loperamide (IMODIUM A-D) 2 MG tablet Take 2 mg by mouth 2 (two) times daily. (Do not exceed more than 4 tablets in 24 hours)     Multiple Vitamin (MULTIVITAMIN WITH MINERALS) TABS Take 1  tablet by mouth daily.     Omega-3 Fatty Acids (FISH OIL) 1000 MG CAPS Take 1 capsule by mouth daily.      psyllium (METAMUCIL) 58.6 % packet Take 1 packet by mouth daily as needed (For fiber/thickener).      rosuvastatin (CRESTOR) 10 MG tablet Take 10 mg by mouth every other day.      Turmeric (QC TUMERIC COMPLEX PO) Take 1 tablet by mouth daily as needed (For inflammatory).     vitamin B-12 (CYANOCOBALAMIN) 100 MCG tablet Take 100 mcg by mouth daily.     XIFAXAN 550 MG TABS tablet Take 550 mg by mouth 3 (three) times daily.     VICTOZA 18 MG/3ML SOPN Inject 1.8 mg into the skin daily. (Patient not taking: No sig reported)      Drug Regimen Review Drug regimen was reviewed and remains appropriate with no significant issues  identified  Home: Home Living Family/patient expects to be discharged to:: Private residence Living Arrangements: Alone Available Help at Discharge: Friend(s), Available 24 hours/day Type of Home: House Home Access: Stairs to enter [employer] of Steps: 1 Entrance Stairs-Rails: None Home Layout: One level Bathroom Shower/Tub: Chiropodist: Standard Home Equipment: None   Functional History: Prior Function Level of Independence: Independent Comments: Works at [employer].  Functional Status:  Mobility: Bed Mobility Overal bed mobility: Needs Assistance Bed Mobility: Supine to Sit, Sit to Supine Supine to sit: Min assist Sit to supine: Min assist General bed mobility comments: minA to return LEs into bed and trunk elevation to EOB Transfers Overall transfer level: Needs assistance Equipment used: Rolling walker (2 wheeled) Transfers: Sit to/from Stand Sit to Stand: Min assist Squat pivot transfers: Min guard General transfer comment: minA to rise and steady from low surface. Complains of back pain upon standing requiring RW Ambulation/Gait Ambulation/Gait assistance: Min assist, Min  guard Gait Distance (Feet): 200 Feet Assistive device: Rolling walker (2 wheeled) Gait Pattern/deviations: Step-through pattern, Decreased stride length, Drifts right/left, Wide base of support General Gait Details: Use of RW this date due to increased back pain upon standing and patient with forward flexed posture without AD. Improved posture with RW present. MinA for balance, moments of min guard Gait velocity: WNL Gait velocity interpretation: <1.31 ft/sec, indicative of household ambulator    ADL: ADL Overall ADL's : Needs assistance/impaired Grooming: Oral care, Standing, Min guard Upper Body Dressing : Min guard, Sitting Lower Body Dressing: Min guard, Sitting/lateral leans Functional mobility during ADLs: Min guard  Cognition: Cognition Overall Cognitive Status: Impaired/Different from baseline Orientation Level: Oriented X4 Attention: Sustained, Focused Sustained Attention: Impaired Sustained Attention Impairment: Functional basic, Verbal basic Memory: Impaired Memory Impairment: Storage deficit (did not recall use of call bell - which button to push, etc) Awareness: Impaired (trying to get OOB although he requires significant assistance) Problem Solving: Impaired (trying to get OOB) Problem Solving Impairment: Functional basic Cognition Arousal/Alertness: Awake/alert Behavior During Therapy: WFL for tasks assessed/performed Overall Cognitive Status: Impaired/Different from baseline Area of Impairment: Orientation, Attention, Memory, Following commands, Safety/judgement, Awareness, Problem solving Orientation Level: Disoriented to, Place Current Attention Level: Sustained Memory: Decreased short-term memory Following Commands: Follows one step commands with increased time Safety/Judgement: Decreased awareness of safety, Decreased awareness of deficits Awareness: Intellectual Problem Solving: Difficulty sequencing, Requires verbal cues, Requires tactile cues, Slow  processing General Comments: Disoriented to place this session, requiring 2 attempts to reorient. STM deficits noted with providing room number and engaged in conversation during ambulation, patient unable to recall correct room number or identify room with familiar objects present  Physical Exam: Blood pressure 129/87, pulse (!) 104, temperature 98.2 F (36.8 C), temperature source Oral, resp. rate 20, height 5' 11"  (1.803 m), weight 89.2 kg, SpO2 98 %. Physical Exam Constitutional:      General: He is not in acute distress. HENT:     Head: Normocephalic.     Mouth/Throat:     Mouth: Mucous membranes are moist.  Eyes:     Extraocular Movements: Extraocular movements intact.  Cardiovascular:     Rate and Rhythm: Regular rhythm. Tachycardia present.     Heart sounds: No murmur heard. Pulmonary:     Effort: Pulmonary effort is normal. No respiratory distress.     Breath sounds: No wheezing.  Abdominal:     General: There is distension.     Tenderness: There is no  abdominal tenderness.  Musculoskeletal:        General: No tenderness. Normal range of motion.     Cervical back: Normal range of motion.  Skin:    General: Skin is warm and dry.  Neurological:     Mental Status: He is alert.     Comments: Patient is alert sitting at bedside.  He is a bit restless.  Makes eye contact with examiner.  Provides name and age.  Oriented to month, day of week, year, day with cues. STM deficits. Impaired insight and awareness. Could provide some biographical information. Moves all 4 limbs. Mild left PD and decreased FMC/apraxia LUE and LLE. Senses pain and LT in all 4's. No focal CN findings.   Psychiatric:     Comments: A little restless, impulsive    Results for orders placed or performed during the hospital encounter of 12/11/20 (from the past 48 hour(s))  Glucose, capillary     Status: Abnormal   Collection Time: 12/19/20 11:55 AM  Result Value Ref Range   Glucose-Capillary 180 (H) 70 - 99  mg/dL    Comment: Glucose reference range applies only to samples taken after fasting for at least 8 hours.  Glucose, capillary     Status: Abnormal   Collection Time: 12/19/20  3:32 PM  Result Value Ref Range   Glucose-Capillary 165 (H) 70 - 99 mg/dL    Comment: Glucose reference range applies only to samples taken after fasting for at least 8 hours.  Glucose, capillary     Status: Abnormal   Collection Time: 12/19/20  9:11 PM  Result Value Ref Range   Glucose-Capillary 129 (H) 70 - 99 mg/dL    Comment: Glucose reference range applies only to samples taken after fasting for at least 8 hours.  Glucose, capillary     Status: Abnormal   Collection Time: 12/20/20  6:23 AM  Result Value Ref Range   Glucose-Capillary 157 (H) 70 - 99 mg/dL    Comment: Glucose reference range applies only to samples taken after fasting for at least 8 hours.  Glucose, capillary     Status: Abnormal   Collection Time: 12/20/20 12:32 PM  Result Value Ref Range   Glucose-Capillary 154 (H) 70 - 99 mg/dL    Comment: Glucose reference range applies only to samples taken after fasting for at least 8 hours.  Glucose, capillary     Status: Abnormal   Collection Time: 12/20/20  4:53 PM  Result Value Ref Range   Glucose-Capillary 146 (H) 70 - 99 mg/dL    Comment: Glucose reference range applies only to samples taken after fasting for at least 8 hours.  Glucose, capillary     Status: Abnormal   Collection Time: 12/20/20  9:01 PM  Result Value Ref Range   Glucose-Capillary 136 (H) 70 - 99 mg/dL    Comment: Glucose reference range applies only to samples taken after fasting for at least 8 hours.  Glucose, capillary     Status: Abnormal   Collection Time: 12/21/20  6:20 AM  Result Value Ref Range   Glucose-Capillary 139 (H) 70 - 99 mg/dL    Comment: Glucose reference range applies only to samples taken after fasting for at least 8 hours.   No results found.     Medical Problem List and Plan: 1.  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  Antithrombotics: -DVT/anticoagulation: SCDs  -antiplatelet therapy: N/A 3. Pain Management: Robaxin as needed, Tylenol as needed 4. Mood: Provide emotional support  -antipsychotic agents: Risperdal 1 mg twice daily 5. Neuropsych: This patient is not capable of making decisions on his own behalf. 6. Skin/Wound Care: Routine skin checks 7. Fluids/Electrolytes/Nutrition: Routine in and outs with follow-up chemistries on admit 8.  Hypertension.  Clonidine 01 mg daily, Norvasc 10 mg daily, hydralazine 100 mg twice daily.  Monitor with increased mobility  -BP under fair control at present 9.  Ulcerative colitis.Xifaxan 550 mg twice daily, Entocort 9 mg daily  -pt having a lot of gas currently causing abdominal discomfort/distention  -will schedule simethicone for now 10.  GNR found 1 blood culture 12/16/2020.  Placed on empiric Rocephin 12/18/2020  -cx + diptheroids---does this need to be treated? 11.  AKI.  Follow-up chemistries 12.  Type 2 diabetes mellitus.  Hemoglobin A1c 5.9.  SSI.    Lavon Paganini Angiulli, PA-C 12/21/2020

## 2020-12-21 NOTE — Progress Notes (Signed)
SLP Cancellation Note  Patient Details Name: Matthew Francis MRN: [id number] DOB: 03-01-1961   Cancelled treatment:       Reason Eval/Treat Not Completed: Other (comment) (pt has approval to go to CIR today per Danne Baxter, f/u on CIR for SLP advised. Thanks) Kathleen Lime, MS [facility] SLP Acute Rehab Services Office [phone] Pager [phone]   [name] 12/21/2020, 12:58 PM

## 2020-12-21 NOTE — PMR Pre-admission (Signed)
PMR Admission Coordinator Pre-Admission Assessment  Patient: Matthew Francis is an 60 y.o., male MRN: [id number] DOB: [birth year] Height: _0  (180.3 cm) Weight: 89.2 kg Insurance Information HMO:     PPO:      PCP:      IPA:      80/20:      OTHER:  PRIMARY: [facility]      Policy#: [id number]      Subscriber: pt CM Name: via fax      Phone#: [phone]     Fax#: [phone] Pre-Cert#: [id number] approved for 7 days      Employer:  Benefits:  Phone #: [phone]     Name: 7/7 Eff. Date: 06/17/2019     Deduct: none      Out of Pocket Max: $900      Life Max: none CIR: 90%      SNF: 90% Outpatient: $10 copay per visit     Co-Pay:  Home Health: 90%      Co-Pay: 10% DME: 90%     Co-Pay: RENTAL only Providers: in network none SECONDARY:       Policy#:      Phone#:   Development worker, community:       Phone#:   The Engineer, petroleum" for patients in Inpatient Rehabilitation Facilities with attached "Privacy Act [place] Records" was provided and verbally reviewed with: N/A  Emergency Contact Information Contact Information     Name Relation Home Work Mobile   [place] Father [phone]     Hortense Ramal [phone]         Current Medical History  Patient Admitting Diagnosis:ICH  History of Present Illness:  60 year old right-handed male history of hypertension, obesity with BMI 27.43, ulcerative colitis as well as hyperlipidemia.   Presented 12/11/2020 with headache as well as nausea and vomiting.  Admission chemistries unremarkable aside glucose 125, creatinine 1.31, hemoglobin A1c 5.9, urine drug screen positive cocaine, ammonia level 39.  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  No midline shift.  MRA was unremarkable.  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  Echocardiogram with ejection fraction of 60 to 80% grade 1 diastolic dysfunction.  EEG negative for seizure.  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  Dr. Kathyrn Sheriff follow-up for right caudate hemorrhage as well as intraventricular extension question hydrocephalus no current plan for EVD.  He is tolerating a regular consistency diet.  Blood culture 12/16/2020 gram-negative rods found in the 1 culture.  He was placed on Rocephin empirically and awaiting plan for duration of antibiotic.  Patient with intermittent bouts of restlessness and agitation placed on Risperdal.   Complete NIHSS TOTAL: 0  Patient's medical record from [facility]  has been reviewed by the rehabilitation admission coordinator and physician.  Past Medical History  Past Medical History:  Diagnosis Date   Arthritis    GERD (gastroesophageal reflux disease)    H/O ulcerative colitis    Hyperlipidemia    Hypertension    Inguinal hernia    Ulcer    ulcerative colitis   Ulcerative colitis    Wears glasses     Family History   family history includes Cancer in his sister.  Prior Rehab/Hospitalizations Has  the patient had prior rehab or hospitalizations prior to admission? Yes  Has the patient had major surgery during 100 days prior to admission? No   Current Medications  Current Facility-Administered Medications:    acetaminophen (TYLENOL) tablet 650 mg, 650 mg, Oral, Q6H PRN, Bhagat, Srishti L, MD, 650 mg at 12/21/20 1002   amLODipine (NORVASC) tablet 10 mg, 10 mg, Oral, Daily, Mariel Aloe, MD, 10 mg at 12/21/20 1004   budesonide (ENTOCORT EC) 24 hr capsule 9 mg, 9 mg, Oral, Daily, Gwinda Maine, MD, 9 mg at 12/21/20 1004   cloNIDine (CATAPRES) tablet 0.1 mg, 0.1 mg, Oral, Daily, Mariel Aloe, MD,  0.1 mg at 12/21/20 1004   haloperidol lactate (HALDOL) injection 2 mg, 2 mg, Intravenous, Q6H PRN, Lavina Hamman, MD, 2 mg at 12/18/20 2339   hydrALAZINE (APRESOLINE) injection 10 mg, 10 mg, Intravenous, Q4H PRN, Lavina Hamman, MD   hydrALAZINE (APRESOLINE) tablet 100 mg, 100 mg, Oral, BID, Mariel Aloe, MD, 100 mg at 12/21/20 1004   HYDROcodone-acetaminophen (NORCO/VICODIN) 5-325 MG per tablet 1-2 tablet, 1-2 tablet, Oral, Q6H PRN, Mariel Aloe, MD   insulin aspart (novoLOG) injection 0-9 Units, 0-9 Units, Subcutaneous, TID AC & HS, Rosalin Hawking, MD, 1 Units at 12/21/20 0901   methocarbamol (ROBAXIN) tablet 500 mg, 500 mg, Oral, Q8H PRN, Lavina Hamman, MD, 500 mg at 12/21/20 1002   multivitamin with minerals tablet 1 tablet, 1 tablet, Oral, Daily, Rosalin Hawking, MD, 1 tablet at 12/21/20 1003   ondansetron (ZOFRAN) injection 4 mg, 4 mg, Intravenous, Q6H PRN, Bhagat, Srishti L, MD, 4 mg at 12/21/20 0212   pantoprazole (PROTONIX) EC tablet 40 mg, 40 mg, Oral, Daily, Rosalin Hawking, MD, 40 mg at 12/21/20 1003   rifaximin (XIFAXAN) tablet 550 mg, 550 mg, Oral, BID, Lavina Hamman, MD, 550 mg at 12/21/20 1004   risperiDONE (RISPERDAL) tablet 1 mg, 1 mg, Oral, BID, Lavina Hamman, MD, 1 mg at 12/21/20 1004   Vitamin D (Ergocalciferol) (DRISDOL) capsule 50,000 Units, 50,000 Units, Oral, Q Kathaleen Bury, RPH, 50,000 Units at 12/19/20 1059   white petrolatum (VASELINE) gel, , Topical, PRN, Rosalin Hawking, MD, 0.2 application at 24/23/53 2355  Patients Current Diet:  Diet Order             Diet 2 gram sodium Room service appropriate? Yes; Fluid consistency: Thin  Diet effective now                   Precautions / Restrictions Precautions Precautions: Fall Precaution Comments: SBP < 140, behavior Restrictions Weight Bearing Restrictions: No   Has the patient had 2 or more falls or a fall with injury in the past year? No  Prior Activity Level Community (5-7x/wk): Independent;  training for a new job  Prior Functional Level Self Care: Did the patient need help bathing, dressing, using the toilet or eating? Independent  Indoor Mobility: Did the patient need assistance with walking from room to room (with or without device)? Independent  Stairs: Did the patient need assistance with internal or external stairs (with or without device)? Independent  Functional Cognition: Did the patient need help planning regular tasks such as shopping or remembering to take medications? Independent  Home Assistive Devices / Equipment Home Assistive Devices/Equipment: None Home Equipment: None  Prior Device Use: Indicate devices/aids used by the patient prior to current illness, exacerbation or injury? None of the above  Current Functional Level Cognition  Overall Cognitive  Status: Impaired/Different from baseline Current Attention Level: Sustained Orientation Level: Oriented X4 Following Commands: Follows one step commands with increased time Safety/Judgement: Decreased awareness of safety, Decreased awareness of deficits General Comments: Disoriented to place this session, requiring 2 attempts to reorient. STM deficits noted with providing room number and engaged in conversation during ambulation, patient unable to recall correct room number or identify room with familiar objects present Attention: Sustained, Focused Sustained Attention: Impaired Sustained Attention Impairment: Functional basic, Verbal basic Memory: Impaired Memory Impairment: Storage deficit (did not recall use of call bell - which button to push, etc) Awareness: Impaired (trying to get OOB although he requires significant assistance) Problem Solving: Impaired (trying to get OOB) Problem Solving Impairment: Functional basic    Extremity Assessment (includes Sensation/Coordination)  Upper Extremity Assessment: Generalized weakness LUE Deficits / Details: mild coodrdination noted but functional. Pt states  he "doesn't really notice anything" LUE Sensation: WNL LUE Coordination: decreased gross motor  Lower Extremity Assessment: Defer to PT evaluation LLE Deficits / Details: unable to accurately assess as pt not consistently following commands. No L knee buckling in standing but seemed unsteady on L side LLE Sensation: WNL LLE Coordination: decreased gross motor    ADLs  Overall ADL's : Needs assistance/impaired Grooming: Oral care, Standing, Supervision/safety, Wash/dry face, Wash/dry hands Upper Body Dressing : Minimal assistance, Standing Lower Body Dressing: Set up, Sitting/lateral leans Toilet Transfer: Supervision/safety, RW, Ambulation Functional mobility during ADLs: Supervision/safety, Rolling walker General ADL Comments: increased complaints of back pain today    Mobility  Overal bed mobility: Needs Assistance Bed Mobility: Supine to Sit Supine to sit: Min assist Sit to supine: Min assist General bed mobility comments: minA to return LEs into bed and trunk elevation to EOB    Transfers  Overall transfer level: Needs assistance Equipment used: Rolling walker (2 wheeled) Transfers: Sit to/from Stand Sit to Stand: Min assist Squat pivot transfers: Min guard General transfer comment: minA to rise and steady from low surface. Complains of back pain upon standing requiring RW    Ambulation / Gait / Stairs / Wheelchair Mobility  Ambulation/Gait Ambulation/Gait assistance: Min assist, Counsellor (Feet): 200 Feet Assistive device: Rolling walker (2 wheeled) Gait Pattern/deviations: Step-through pattern, Decreased stride length, Drifts right/left, Wide base of support General Gait Details: Use of RW this date due to increased back pain upon standing and patient with forward flexed posture without AD. Improved posture with RW present. MinA for balance, moments of min guard Gait velocity: WNL Gait velocity interpretation: <1.31 ft/sec, indicative of household ambulator     Posture / Balance Dynamic Sitting Balance Sitting balance - Comments: supervision needed for safety Balance Overall balance assessment: Needs assistance Sitting-balance support: Feet supported, Bilateral upper extremity supported Sitting balance-Leahy Scale: Good Sitting balance - Comments: supervision needed for safety Standing balance support: Bilateral upper extremity supported, During functional activity Standing balance-Leahy Scale: Poor Standing balance comment: reliant on external assist Standardized Balance Assessment Standardized Balance Assessment : Dynamic Gait Index Dynamic Gait Index Level Surface: Normal Change in Gait Speed: Normal Gait with Horizontal Head Turns: Normal Gait with Vertical Head Turns: Mild Impairment Gait and Pivot Turn: Normal Step Over Obstacle: Normal Step Around Obstacles: Normal Steps: Normal Total Score: 23    Special needs/care consideration    Previous Home Environment  Living Arrangements: Alone  Lives With: Alone Available Help at Discharge: Available PRN/intermittently (Aunt prn and other friends) Type of Home: House Home Layout: One level Home Access: Stairs to enter Entrance Stairs-Rails:  None Entrance Stairs-Number of Steps: 1 Bathroom Shower/Tub: Optometrist: Yes How Accessible: Accessible via walker Home Care Services: No  Discharge Living Setting Plans for Discharge Living Setting: Patient's home, Alone Type of Home at Discharge: House Discharge Home Layout: One level Discharge Home Access: Stairs to enter Entrance Stairs-Rails: None Entrance Stairs-Number of Steps: 1 Discharge Bathroom Shower/Tub: Tub/shower unit Discharge Bathroom Toilet: Standard Discharge Bathroom Accessibility: Yes How Accessible: Accessible via walker Does the patient have any problems obtaining your medications?: Yes (Describe)  Social/Family/Support Systems Contact Information: aunt  [name] Anticipated Caregiver: aunt and friend prn Anticipated Caregiver's Contact Information: see above Caregiver Availability: Intermittent Discharge Plan Discussed with Primary Caregiver: Yes Is Caregiver In Agreement with Plan?: Yes Does Caregiver/Family have Issues with Lodging/Transportation while Pt is in Rehab?: No  Goals Patient/Family Goal for Rehab: Mod I with PT, OT and SLP Expected length of stay: ELOS 7 to 10 days Pt/Family Agrees to Admission and willing to participate: Yes Program Orientation Provided & Reviewed with Pt/Caregiver Including Roles  & Responsibilities: Yes  Decrease burden of Care through IP rehab admission: n/a  Possible need for SNF placement upon discharge: not anticipated  Patient Condition: I have reviewed medical records from [facility], spoken with CM, and patient and family member. I met with patient at the bedside for inpatient rehabilitation assessment.  Patient will benefit from ongoing PT, OT, and SLP, can actively participate in 3 hours of therapy a day 5 days of the week, and can make measurable gains during the admission.  Patient will also benefit from the coordinated team approach during an Inpatient Acute Rehabilitation admission.  The patient will receive intensive therapy as well as Rehabilitation physician, nursing, social worker, and care management interventions.  Due to bladder management, bowel management, safety, skin/wound care, disease management, medication administration, pain management, and patient education the patient requires 24 hour a day rehabilitation nursing.  The patient is currently min assist overall with mobility and basic ADLs.  Discharge setting and therapy post discharge at home with home health is anticipated.  Patient has agreed to participate in the Acute Inpatient Rehabilitation Program and will admit today.  Preadmission Screen Completed By:  Cleatrice Burke, 12/21/2020 12:54  PM ______________________________________________________________________   Discussed status with Dr. Naaman Plummer on 12/21/2020 at 1300 and received approval for admission today.  Admission Coordinator:  Cleatrice Burke, RN, time 1300 Date 12/21/2020   Assessment/Plan: Diagnosis: right BG hemorrhage with left  hemiparesis Does the need for close, 24 hr/day Medical supervision in concert with the patient's rehab needs make it unreasonable for this patient to be served in a less intensive setting? Yes Co-Morbidities requiring supervision/potential complications: HTN, obesity, UC Due to bladder management, bowel management, safety, skin/wound care, disease management, medication administration, pain management, and patient education, does the patient require 24 hr/day rehab nursing? Yes Does the patient require coordinated care of a physician, rehab nurse, PT, OT, and SLP to address physical and functional deficits in the context of the above medical diagnosis(es)? Yes Addressing deficits in the following areas: balance, endurance, locomotion, strength, transferring, bowel/bladder control, bathing, dressing, feeding, grooming, toileting, cognition, speech, and psychosocial support Can the patient actively participate in an intensive therapy program of at least 3 hrs of therapy 5 days a week? Yes The potential for patient to make measurable gains while on inpatient rehab is excellent Anticipated functional outcomes upon discharge from inpatient rehab: modified independent PT, modified independent OT, modified independent SLP Estimated  rehab length of stay to reach the above functional goals is: 7-10 days Anticipated discharge destination: Home 10. Overall Rehab/Functional Prognosis: excellent   MD Signature: Meredith Staggers, MD, [facility] 12/21/2020

## 2020-12-21 NOTE — Progress Notes (Signed)
This nurse has secure chatted both Dr. Lonny Prude and Caryl Pina RN. The patient has pulled out multiple PIV, some have infiltrated. This patient has had 8 PIVs in 9 days. Many have been started with [place] due to poor vasculature. IV team (VAST) recommends that the patient be changed to all oral medications at this time. Fran Lowes, RN VAST

## 2020-12-21 NOTE — Progress Notes (Signed)
INPATIENT REHABILITATION ADMISSION NOTE   Arrival Method: via bed     Mental Orientation: A&O x3   Assessment: completed   Skin: CDI   IV'S: N/A   Pain: None reported   Tubes and Drains: N/A   Safety Measures: bed alarm engaged   Vital Signs: see chart   Height and Weight: see chart   Rehab Orientation: completed   Family: Aunt in room with patient.   Notes: Introduced myself to patient and Aunt. Explained my role in his care. Completed assessment and admission documentation for nurse due to him being held up with another patient. Answered all questions the Aunt had and could think of. I explained the rehab process, schedule, and what therapy does. I answered any last minute questions. I will continue to monitor this patient.  Dorthula Nettles, RN3, BSN, CBIS, [place], [facility], Inpatient Rehabilitation Office [phone] Cell [phone]

## 2020-12-21 NOTE — Progress Notes (Signed)
Inpatient Rehabilitation Admissions Coordinator   I have insurance approval and can admit to CIR today. I have contacted Dr. Lonny Prude, acute team and TOC to make the arrangements.   Danne Baxter, RN, MSN Rehab Admissions Coordinator [phone] 12/21/2020 12:31 PM

## 2020-12-21 NOTE — Discharge Summary (Signed)
Physician Discharge Summary  Matthew Francis [id number] DOB: [birth year] DOA: 12/11/2020  PCP: Charline Bills, MD  Admit date: 12/11/2020 Discharge date: 12/21/2020  Admitted From: Home Disposition: Inpatient rehabilitation  Recommendations for Outpatient Follow-up:  Recommend social work consult for substance abuse resources Neurology follow-up in 4 weeks; ambulatory referral ordered Please follow up on the following pending results: None   Discharge Condition: Stable CODE STATUS: Full code Diet recommendation: Regular   Brief/Interim Summary:  Admission HPI written by Lorenza Chick, MD   HPI: Matthew Francis is a 60 y.o. male with a past medical history significant for ulcerative colitis, diabetes, hypertension, hyperlipidemia, chronic kidney disease, a single episode of prior paroxysmal atrial fibrillation, obesity (BMI 28).   He was in his usual state of health yesterday watching TV when around 10 or 11 PM he started to have a severe headache.  This prevented him from sleeping all night due to the severe throbbing pain.  It would improve slightly with Tylenol, he was taking 2 tabs of 500 mg at a time and estimates he took it 3 or 4 times although he did also begin to get confused and disoriented.  He was also having nausea and vomiting which she treated with a fruity over-the-counter home remedy without much improvement.  Given worsening of his headache today at around 1 or 2 PM with inability to stand he decided to present for further evaluation.  He notes he has been out of his blood pressure medications for a couple of days but otherwise has been taking most of his medications.   When head CT revealed right caudate head intracerebral hemorrhage with intraventricular extension neurology was consulted for further management.   Regarding his history of atrial fibrillation, this was in the setting of sepsis in August 2021.  Given no other known episodes of atrial fibrillation he  has not been on anticoagulation per cardiology    Hospital course:  Intracranial hemorrhage Acute right caudate intracerebral hemorrhage Likely secondary to hypertensive emergency, cocaine vasculopathy and small vessel disease, per neurology. Patient was admitted to the ICU by neurology/stroke service. Critical care was consulted. Initial CT on 6/28 significant for acute intracerebral hemorrhage. MRI/MRA without evidence of underlying mass, aneurysm or AVM. Hemorrhage stable on multiple repeat CT head scans. Carotid dopplers unremarkable for significant disease. Transthoracic Echocardiogram significant for an EF of 62-13% and no embolic source noted. LDL of 166. PT/OT recommendations for inpatient rehabilitation.   Intraventricular hemorrhage Diagnosed on admission. Hemorrhage involved the right lateral, third and partial fourth ventricles. Patient had worsening confusion on 7/1 and stat head CT was significant for stable hemorrhage with mild hydrocephalus. Neurosurgery was on board and recommended no EVD. Repeat CT scan on 7/2 significant for improved hydrocephalus. No epileptiform activity on LTM EEG.  Hypertensive emergency Patient was managed on hydralazine, amlodipine, clonidine. History of hypertension prior to admission and was on atenolol, hydralazine and amlodipine as an outpatient. Atenolol discontinued secondary to cocaine use. Resume hydralazine and amlodipine. Clonidine added.  Acute metabolic encephalopathy Concern this was multifactorial but no definite cause was identified. In setting of above, however. Patient started on Risperdal while inpatient which is continued on discharge. May be able to discontinue if remains without delirium/agitation.  AKI Resolved with I fluids  Hypokalemia Supplementation given.  Diabetes mellitus, type 2 Ruled out. Hemoglobin A1C of 5.9%. Patient does not meet criteria for diabetes.  Hyperlipidemia Continue Crestor  Cocaine abuse Patient  counseled and is eager to quit. Recommend  social work for resources.  Discharge Diagnoses:  Active Problems:   AKI (acute kidney injury) ([place])   Essential hypertension   ICH (intracerebral hemorrhage) ([place])   Hypertensive emergency   Acute metabolic encephalopathy    Discharge Instructions  Discharge Instructions     Ambulatory referral to Neurology   Complete by: As directed    Follow up with stroke clinic NP (Jessica Vanschaick or Cecille Rubin, if both not available, consider Zachery Dauer, or [place]) at [facility] in about 4 weeks. Thanks.      Allergies as of 12/21/2020       Reactions   Asacol [mesalamine] Swelling   Throat   Penicillins Anaphylaxis   Has patient had a PCN reaction causing immediate rash, facial/tongue/throat swelling, SOB or lightheadedness with hypotension: Yes Has patient had a PCN reaction causing severe rash involving mucus membranes or skin necrosis: No Has patient had a PCN reaction that required hospitalization: Yes Has patient had a PCN reaction occurring within the last 10 years: No If all of the above answers are "NO", then may proceed with Cephalosporin use.   Sulfa Antibiotics Anaphylaxis   Atorvastatin Other (See Comments)   Myalgia, muscle cramps   Bee Pollen Hives   Bee Venom Hives, Swelling   Iodinated Diagnostic Agents    Pt reports allergy at [place]. Rapid heartbeat and dyspnea        Medication List     STOP taking these medications    atenolol 25 MG tablet Commonly known as: TENORMIN   dicyclomine 10 MG capsule Commonly known as: BENTYL       TAKE these medications    acetaminophen 500 MG tablet Commonly known as: TYLENOL Take 500 mg by mouth every 6 (six) hours as needed for mild pain.   amLODipine 10 MG tablet Commonly known as: NORVASC Take 10 mg by mouth daily.   ascorbic acid 500 MG tablet Commonly known as: VITAMIN C Take 500 mg by mouth daily.   budesonide 3 MG 24 hr capsule Commonly known as:  ENTOCORT EC Take 9 mg by mouth daily.   cetirizine 10 MG tablet Commonly known as: ZYRTEC Take 10 mg by mouth daily as needed for allergies.   cloNIDine 0.1 MG tablet Commonly known as: CATAPRES Take 1 tablet (0.1 mg total) by mouth daily. Start taking on: December 22, 2020   esomeprazole 20 MG capsule Commonly known as: NEXIUM Take 20 mg by mouth daily.   ezetimibe 10 MG tablet Commonly known as: ZETIA Take 10 mg by mouth daily.   Fish Oil 1000 MG Caps Take 1 capsule by mouth daily.   gabapentin 300 MG capsule Commonly known as: NEURONTIN Take 300 mg by mouth 3 (three) times daily as needed (nerve pain).   GLUCOSAMINE PO Take 1 tablet by mouth daily.   hydrALAZINE 100 MG tablet Commonly known as: APRESOLINE Take 1 tablet (100 mg total) by mouth 2 (two) times daily. What changed:  medication strength how much to take when to take this Another medication with the same name was removed. Continue taking this medication, and follow the directions you see here.   HYDROcodone-acetaminophen 5-325 MG tablet Commonly known as: NORCO/VICODIN Take 1-2 tablets by mouth every 6 (six) hours as needed for severe pain or moderate pain.   loperamide 2 MG tablet Commonly known as: IMODIUM A-D Take 2 mg by mouth 2 (two) times daily. (Do not exceed more than 4 tablets in 24 hours)   methocarbamol 500 MG tablet Commonly  known as: ROBAXIN Take 1 tablet (500 mg total) by mouth every 8 (eight) hours as needed for muscle spasms.   multivitamin with minerals Tabs tablet Take 1 tablet by mouth daily.   psyllium 58.6 % packet Commonly known as: METAMUCIL Take 1 packet by mouth daily as needed (For fiber/thickener).   QC TUMERIC COMPLEX PO Take 1 tablet by mouth daily as needed (For inflammatory).   rifaximin 550 MG Tabs tablet Commonly known as: XIFAXAN Take 1 tablet (550 mg total) by mouth 2 (two) times daily. What changed: when to take this   risperiDONE 1 MG tablet Commonly known  as: RISPERDAL Take 1 tablet (1 mg total) by mouth 2 (two) times daily.   rosuvastatin 10 MG tablet Commonly known as: CRESTOR Take 10 mg by mouth every other day.   Victoza 18 MG/3ML Sopn Generic drug: liraglutide Inject 1.8 mg into the skin daily.   vitamin B-12 100 MCG tablet Commonly known as: CYANOCOBALAMIN Take 100 mcg by mouth daily.   Vitamin D3 1.25 MG (50000 UT) Caps Take 50,000 Units by mouth every Wednesday.        Follow-up Information     [facility]. Schedule an appointment as soon as possible for a visit in 1 month(s).   Specialty: Neurology Why: stroke clinic Contact information: [place] [address] [phone]               Allergies  Allergen Reactions   Asacol [Mesalamine] Swelling    Throat    Penicillins Anaphylaxis    Has patient had a PCN reaction causing immediate rash, facial/tongue/throat swelling, SOB or lightheadedness with hypotension: Yes Has patient had a PCN reaction causing severe rash involving mucus membranes or skin necrosis: No Has patient had a PCN reaction that required hospitalization: Yes Has patient had a PCN reaction occurring within the last 10 years: No If all of the above answers are "NO", then may proceed with Cephalosporin use.   Sulfa Antibiotics Anaphylaxis   Atorvastatin Other (See Comments)    Myalgia, muscle cramps   Bee Pollen Hives   Bee Venom Hives and Swelling   Iodinated Diagnostic Agents     Pt reports allergy at [place]. Rapid heartbeat and dyspnea    Consultations: Neurology Neurosurgery   Procedures/Studies: CT HEAD WO CONTRAST  Result Date: 12/17/2020 CLINICAL DATA:  Intracranial hemorrhage follow EXAM: CT HEAD WITHOUT CONTRAST TECHNIQUE: Contiguous axial images were obtained from the base of the skull through the vertex without intravenous contrast. COMPARISON:  12/15/2020 FINDINGS: Brain: Unchanged intraparenchymal hemorrhage  in the right caudate head extending into the ventricular system. Unchanged mild communicating hydrocephalus. No new site of hemorrhage. Vascular: No hyperdense vessel or unexpected calcification. Skull: Normal. Negative for fracture or focal lesion. Sinuses/Orbits: No acute finding. Other: None IMPRESSION: 1. Unchanged intraparenchymal hemorrhage in the right caudate head extending into the ventricular system. 2. Unchanged mild communicating hydrocephalus. Electronically Signed   By: Ulyses Jarred M.D.   On: 12/17/2020 01:18   CT HEAD WO CONTRAST  Result Date: 12/15/2020 CLINICAL DATA:  Intracranial hemorrhage follow-up EXAM: CT HEAD WITHOUT CONTRAST TECHNIQUE: Contiguous axial images were obtained from the base of the skull through the vertex without intravenous contrast. COMPARISON:  12/14/2020 FINDINGS: Brain: Persistent intraventricular extension of hemorrhage from the right caudate head hematoma. Hydrocephalus has improved. No new site of hemorrhage. Vascular: No abnormal hyperdensity of the major intracranial arteries or dural venous sinuses. No intracranial atherosclerosis. Skull: The visualized  skull base, calvarium and extracranial soft tissues are normal. Sinuses/Orbits: No fluid levels or advanced mucosal thickening of the visualized paranasal sinuses. No mastoid or middle ear effusion. The orbits are normal. IMPRESSION: 1. Improved hydrocephalus. 2. Persistent intraventricular extension of hemorrhage from the right caudate head hematoma. Electronically Signed   By: Ulyses Jarred M.D.   On: 12/15/2020 02:22   CT HEAD WO CONTRAST  Result Date: 12/14/2020 CLINICAL DATA:  Follow-up intracranial hemorrhage EXAM: CT HEAD WITHOUT CONTRAST TECHNIQUE: Contiguous axial images were obtained from the base of the skull through the vertex without intravenous contrast. COMPARISON:  12/13/2020 FINDINGS: Brain: There again noted changes consistent with hemorrhage in the head of the caudate nucleus on the right.  Intraventricular extension into the right lateral ventricle is noted. The thrombus is stable although there are changes of progressive dilatation in the lateral and third ventricles. Fourth ventricle appears within normal limits. No new hemorrhage is seen. Findings suggestive of increasing intracranial pressure are noted with decrease in the sulcal markings when compared with the previous day. Thrombus is noted within the third ventricle which appears to extend into cerebral aqueduct causing the dilatation. Vascular: No hyperdense vessel or unexpected calcification. Skull: Normal. Negative for fracture or focal lesion. Sinuses/Orbits: No acute finding. Other: None. IMPRESSION: Persistent hemorrhage in the head of the caudate nucleus on the right with evidence of interventricular thrombus in the lateral ventricles bilaterally and third ventricle. There is interval dilatation of the lateral and third ventricles with findings of thrombus and apparent occlusion of the cerebral aqueduct related to change in the morphology of the third ventricular thrombus. Additionally signs of increased intracranial pressure are noted with decreased sulcal markings when compared with the previous day. Critical Value/emergent results were called by telephone at the time of interpretation on 12/14/2020 at 4:42 pm to Dr. Rosalin Hawking , who verbally acknowledged these results. Electronically Signed   By: [name] M.D.   On: 12/14/2020 16:43   CT HEAD WO CONTRAST  Result Date: 12/13/2020 CLINICAL DATA:  Worsening headache EXAM: CT HEAD WITHOUT CONTRAST TECHNIQUE: Contiguous axial images were obtained from the base of the skull through the vertex without intravenous contrast. COMPARISON:  12/11/2020 FINDINGS: Brain: Again seen is the right basal ganglia intracerebral hemorrhage with intraventricular extension. No significant change since prior study. Stable mild communicating hydrocephalus. No significant midline shift. Vascular: No  hyperdense vessel or unexpected calcification. Skull: No acute calvarial abnormality. 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 No change since prior study. Electronically Signed   By: Rolm Baptise M.D.   On: 12/13/2020 21:40   CT HEAD WO CONTRAST  Result Date: 12/12/2020 CLINICAL DATA:  Stroke follow-up.  Pupillary asymmetry. EXAM: CT HEAD WITHOUT CONTRAST TECHNIQUE: Contiguous axial images were obtained from the base of the skull through the vertex without intravenous contrast. COMPARISON:  Head CT 12/11/2020 at 7:28 p.m. FINDINGS: Brain: Unchanged appearance of intraparenchymal hematoma centered at the right caudate head. There is extension of hemorrhage into the ventricles with mild communicating hydrocephalus. There is no midline shift. No new site of hemorrhage. Vascular: No abnormal hyperdensity of the major intracranial arteries or dural venous sinuses. No intracranial atherosclerosis. Skull: The visualized skull base, calvarium and extracranial soft tissues are normal. Sinuses/Orbits: No fluid levels or advanced mucosal thickening of the visualized paranasal sinuses. No mastoid or middle ear effusion. The orbits are normal. IMPRESSION: Unchanged appearance of intraparenchymal hematoma centered at the right caudate head with  intraventricular extension and mild communicating hydrocephalus. Electronically Signed   By: Ulyses Jarred M.D.   On: 12/12/2020 00:10   CT Head Wo Contrast  Result Date: 12/11/2020 CLINICAL DATA:  Headache EXAM: CT HEAD WITHOUT CONTRAST TECHNIQUE: Contiguous axial images were obtained from the base of the skull through the vertex without intravenous contrast. COMPARISON:  CT brain 02/19/2020 FINDINGS: Brain: 19 x 20 mm acute hemorrhage within the right caudate/basal ganglia. Moderate acute hemorrhage within the right lateral ventricle with additional hemorrhage in the third and fourth  ventricles. The ventricles are mildly enlarged. No midline shift. Mild chronic small vessel ischemic changes of the white matter. Vascular: No hyperdense vessels.  No unexpected calcification Skull: Normal. Negative for fracture or focal lesion. Sinuses/Orbits: No acute finding. Other: None IMPRESSION: 1. Acute hemorrhage within the right caudate/basal ganglia with hemorrhage in the right lateral, third, and fourth ventricles. No midline shift. The ventricles are slightly dilated as compared with prior CT from 2021. 2. Mild chronic small vessel ischemic change of the white matter. Critical Value/emergent results were called by telephone at the time of interpretation on 12/11/2020 at 7:45 pm to provider ADAM CURATOLO , who verbally acknowledged these results. Electronically Signed   By: Donavan Foil M.D.   On: 12/11/2020 19:50   MR MRA HEAD WO CONTRAST  Result Date: 12/12/2020 CLINICAL DATA:  Intracranial hemorrhage. EXAM: MRI HEAD WITHOUT AND WITH CONTRAST MRA HEAD WITHOUT CONTRAST TECHNIQUE: Multiplanar, multi-echo pulse sequences of the brain and surrounding structures were acquired without and with intravenous contrast. Angiographic images of the Circle of Willis were acquired using MRA technique without intravenous contrast. CONTRAST:  8m GADAVIST GADOBUTROL 1 MMOL/ML IV SOLN COMPARISON:  Head CT 12/11/2020 FINDINGS: MRI HEAD FINDINGS Brain: Unchanged intraparenchymal hematoma of the right caudate head with intraventricular extension. 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 No acute infarct. There is multifocal hyperintense T2-weighted signal within the white matter. Generalized volume loss without a clear lobar predilection. There is no abnormal contrast enhancement. Vascular: Major flow voids are preserved. Skull and upper cervical spine: Normal calvarium and skull base. Visualized upper cervical spine and soft tissues are normal. Sinuses/Orbits:No paranasal sinus fluid levels or advanced mucosal  thickening. No mastoid or middle ear effusion. Normal orbits. MRA HEAD FINDINGS POSTERIOR CIRCULATION: --Vertebral arteries: Normal --Inferior cerebellar arteries: Normal. --Basilar artery: Normal. --Superior cerebellar arteries: Normal. --Posterior cerebral arteries: Normal. Both are predominantly supplied by the posterior communicating arteries (p-comm). ANTERIOR CIRCULATION: --Intracranial internal carotid arteries: Normal. --Anterior cerebral arteries (ACA): Normal. --Middle cerebral arteries (MCA): Normal. ANATOMIC VARIANTS: Fetal origins of both posterior cerebral arteries. IMPRESSION: 1. Unchanged intraparenchymal hematoma of the right caudate head with intraventricular extension and mild communicating hydrocephalus. 2. Normal intracranial MRA. Electronically Signed   By: KUlyses JarredM.D.   On: 12/12/2020 20:50   MR BRAIN W WO CONTRAST  Result Date: 12/12/2020 CLINICAL DATA:  Intracranial hemorrhage. EXAM: MRI HEAD WITHOUT AND WITH CONTRAST MRA HEAD WITHOUT CONTRAST TECHNIQUE: Multiplanar, multi-echo pulse sequences of the brain and surrounding structures were acquired without and with intravenous contrast. Angiographic images of the Circle of Willis were acquired using MRA technique without intravenous contrast. CONTRAST:  958mGADAVIST GADOBUTROL 1 MMOL/ML IV SOLN COMPARISON:  Head CT 12/11/2020 FINDINGS: MRI HEAD FINDINGS Brain: Unchanged intraparenchymal hematoma of the right caudate head with intraventricular extension. 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 No acute infarct. There is multifocal hyperintense T2-weighted signal within the white matter. Generalized volume loss without a clear lobar predilection. There is no abnormal contrast enhancement.  Vascular: Major flow voids are preserved. Skull and upper cervical spine: Normal calvarium and skull base. Visualized upper cervical spine and soft tissues are normal. Sinuses/Orbits:No paranasal sinus fluid levels or advanced mucosal  thickening. No mastoid or middle ear effusion. Normal orbits. MRA HEAD FINDINGS POSTERIOR CIRCULATION: --Vertebral arteries: Normal --Inferior cerebellar arteries: Normal. --Basilar artery: Normal. --Superior cerebellar arteries: Normal. --Posterior cerebral arteries: Normal. Both are predominantly supplied by the posterior communicating arteries (p-comm). ANTERIOR CIRCULATION: --Intracranial internal carotid arteries: Normal. --Anterior cerebral arteries (ACA): Normal. --Middle cerebral arteries (MCA): Normal. ANATOMIC VARIANTS: Fetal origins of both posterior cerebral arteries. IMPRESSION: 1. Unchanged intraparenchymal hematoma of the right caudate head with intraventricular extension and mild communicating hydrocephalus. 2. Normal intracranial MRA. Electronically Signed   By: Ulyses Jarred M.D.   On: 12/12/2020 20:50   DG CHEST PORT 1 VIEW  Result Date: 12/15/2020 CLINICAL DATA:  Headache.  Bradycardia. EXAM: PORTABLE CHEST 1 VIEW COMPARISON:  02/19/2020 FINDINGS: The heart size and mediastinal contours are within normal limits. Both lungs are clear. The visualized skeletal structures are unremarkable. IMPRESSION: No active disease. Electronically Signed   By: Lucienne Capers M.D.   On: 12/15/2020 22:16   EEG adult  Result Date: 12/14/2020 Lora Havens, MD     12/15/2020  7:07 AM Patient Name: Matthew Francis MRN: [id number] Epilepsy Attending: Lora Havens Referring Physician/Provider: Dr Consuella Lose Date: 12/14/2020 Duration: 24.46 mins Patient history: 60 y.o. male with basal ganglia hemorrhage with intraventricular extension who has been neurologically well, with what appears to be episodic confusion and somnolence. EEG to evaluate for seizure  Level of alertness: Awake  AEDs during EEG study: None Technical aspects: This EEG study was done with scalp electrodes positioned according to the 10-20 International system of electrode placement. Electrical activity was acquired at a sampling rate of  500Hz  and reviewed with a high frequency filter of 70Hz  and a low frequency filter of 1Hz . EEG data were recorded continuously and digitally stored. Description: The posterior dominant rhythm consists of 8 Hz activity of moderate voltage (25-35 uV) seen predominantly in posterior head regions, symmetric and reactive to eye opening and eye closing. EEG showed continuous 3 to 6 Hz theta-delta slowing in right hemisphere, maximal temporal region. Hyperventilation and photic stimulation were not performed.   ABNORMALITY - Continuous slow, right hemisphere, maximal temporal region IMPRESSION: This study is suggestive of cortical dysfunction arising from right hemisphere, maximal right temporal region, likely secondary to underlying structural abnormality/bleed. No seizures or epileptiform discharges were seen throughout the recording. Priyanka Barbra Sarks   Overnight EEG with video  Result Date: 12/15/2020 Lora Havens, MD     12/16/2020  9:18 AM Patient Name: Matthew Francis MRN: [id number] Epilepsy Attending: Lora Havens Referring Physician/Provider: Dr Consuella Lose Duration: 12/14/2020 1932 to 12/15/2020 1244  Patient history: 61 y.o. male with basal ganglia hemorrhage with intraventricular extension who has been neurologically well, with what appears to be episodic confusion and somnolence. EEG to evaluate for seizure  Level of alertness: Awake  AEDs during EEG study: None  Technical aspects: This EEG study was done with scalp electrodes positioned according to the 10-20 International system of electrode placement. Electrical activity was acquired at a sampling rate of 500Hz  and reviewed with a high frequency filter of 70Hz  and a low frequency filter of 1Hz . EEG data were recorded continuously and digitally stored.  Description: The posterior dominant rhythm consists of 8 Hz activity of moderate voltage (25-35 uV) seen predominantly in  posterior head regions, symmetric and reactive to eye opening and eye  closing. EEG initially showed continuous 3 to 6 Hz theta-delta slowing in right hemisphere, maximal temporal region which gradually improved to intermittent 3-5hz  theta-delta slowing in right hemisphere, maximal temporal region. Hyperventilation and photic stimulation were not performed.    ABNORMALITY - Continuous slow, right hemisphere, maximal temporal region  IMPRESSION: This study is suggestive of cortical dysfunction arising from right hemisphere, maximal right temporal region, likely secondary to underlying structural abnormality/bleed. There was gradual improvement in slowing which could indicate that it was post-ictal slowing as well.  No seizures or epileptiform discharges were seen throughout the recording.  Lora Havens   ECHOCARDIOGRAM COMPLETE  Result Date: 12/12/2020    ECHOCARDIOGRAM REPORT   Patient Name:   Matthew Francis Date of Exam: 12/12/2020 Medical Rec #:  [id number]     Height:       70.5 in Accession #:    [id number]    Weight:       238.1 lb Date of Birth:  04/11/1961     BSA:          2.260 m Patient Age:    33 years      BP:           138/94 mmHg Patient Gender: M             HR:           95 bpm. Exam Location:  Inpatient Procedure: 2D Echo, Color Doppler and Cardiac Doppler Indications:    Stroke I63.9  History:        Patient has no prior history of Echocardiogram examinations.                 Risk Factors:Dyslipidemia and Hypertension.  Sonographer:    Bernadene Person RDCS Referring Phys: [id number] [place]  1. Left ventricular ejection fraction, by estimation, is 60 to 65%. The left ventricle has normal function. The left ventricle has no regional wall motion abnormalities. Left ventricular diastolic parameters are consistent with Grade I diastolic dysfunction (impaired relaxation).  2. Right ventricular systolic function is normal. The right ventricular size is normal. There is normal pulmonary artery systolic pressure. The estimated right ventricular systolic  pressure is 28.3 mmHg.  3. The mitral valve is normal in structure. Trivial mitral valve regurgitation. No evidence of mitral stenosis.  4. The aortic valve is normal in structure. Aortic valve regurgitation is not visualized. No aortic stenosis is present.  5. The inferior vena cava is normal in size with greater than 50% respiratory variability, suggesting right atrial pressure of 3 mmHg. Conclusion(s)/Recommendation(s): No intracardiac source of embolism detected on this transthoracic study. A transesophageal echocardiogram is recommended to exclude cardiac source of embolism if clinically indicated. FINDINGS  Left Ventricle: Left ventricular ejection fraction, by estimation, is 60 to 65%. The left ventricle has normal function. The left ventricle has no regional wall motion abnormalities. The left ventricular internal cavity size was normal in size. There is  borderline left ventricular hypertrophy. Left ventricular diastolic parameters are consistent with Grade I diastolic dysfunction (impaired relaxation). Right Ventricle: The right ventricular size is normal. No increase in right ventricular wall thickness. Right ventricular systolic function is normal. There is normal pulmonary artery systolic pressure. The tricuspid regurgitant velocity is 2.04 m/s, and  with an assumed right atrial pressure of 3 mmHg, the estimated right ventricular systolic pressure is 66.2 mmHg. Left Atrium: Left atrial size was normal  in size. Right Atrium: Right atrial size was normal in size. Pericardium: There is no evidence of pericardial effusion. Mitral Valve: The mitral valve is normal in structure. Trivial mitral valve regurgitation. No evidence of mitral valve stenosis. Tricuspid Valve: The tricuspid valve is normal in structure. Tricuspid valve regurgitation is not demonstrated. No evidence of tricuspid stenosis. Aortic Valve: The aortic valve is normal in structure. Aortic valve regurgitation is not visualized. No aortic  stenosis is present. Pulmonic Valve: The pulmonic valve was normal in structure. Pulmonic valve regurgitation is mild. No evidence of pulmonic stenosis. Aorta: The aortic root is normal in size and structure. Venous: The inferior vena cava is normal in size with greater than 50% respiratory variability, suggesting right atrial pressure of 3 mmHg. IAS/Shunts: No atrial level shunt detected by color flow Doppler.  LEFT VENTRICLE PLAX 2D LVIDd:         4.20 cm  Diastology LVIDs:         2.40 cm  LV e' medial:    5.78 cm/s LV PW:         1.10 cm  LV E/e' medial:  9.8 LV IVS:        0.90 cm  LV e' lateral:   10.20 cm/s LVOT diam:     2.00 cm  LV E/e' lateral: 5.5 LV SV:         65 LV SV Index:   29 LVOT Area:     3.14 cm  RIGHT VENTRICLE RV S prime:     14.30 cm/s TAPSE (M-mode): 1.6 cm LEFT ATRIUM             Index       RIGHT ATRIUM           Index LA diam:        3.50 cm 1.55 cm/m  RA Area:     13.90 cm LA Vol (A2C):   51.3 ml 22.70 ml/m RA Volume:   35.30 ml  15.62 ml/m LA Vol (A4C):   57.2 ml 25.31 ml/m LA Biplane Vol: 59.3 ml 26.24 ml/m  AORTIC VALVE LVOT Vmax:   124.00 cm/s LVOT Vmean:  85.300 cm/s LVOT VTI:    0.206 m  AORTA Ao Root diam: 3.30 cm Ao Asc diam:  3.30 cm MITRAL VALVE               TRICUSPID VALVE MV Area (PHT): 2.26 cm    TR Peak grad:   16.6 mmHg MV Decel Time: 335 msec    TR Vmax:        204.00 cm/s MV E velocity: 56.40 cm/s MV A velocity: 62.90 cm/s  SHUNTS MV E/A ratio:  0.90        Systemic VTI:  0.21 m                            Systemic Diam: 2.00 cm Cherlynn Kaiser MD Electronically signed by Cherlynn Kaiser MD Signature Date/Time: 12/12/2020/4:46:34 PM    Final    VAS US CAROTID  Result Date: 12/17/2020 Carotid Arterial Duplex Study Patient Name:  Matthew Francis  Date of Exam:   12/13/2020 Medical Rec #: [id number]      Accession #:    [id number] Date of Birth: 13-Sep-1960      Patient Gender: M Patient Age:   59Y Exam Location:  [facility] Procedure:      VAS US CAROTID  Referring Phys: [id number]  JINDONG XU --------------------------------------------------------------------------------  Indications:       CVA. Risk Factors:      Hypertension, hyperlipidemia. Comparison Study:  no prior Performing Technologist: Archie Patten RVS  Examination Guidelines: A complete evaluation includes B-mode imaging, spectral Doppler, color Doppler, and power Doppler as needed of all accessible portions of each vessel. Bilateral testing is considered an integral part of a complete examination. Limited examinations for reoccurring indications may be performed as noted.  Right Carotid Findings: +----------+--------+--------+--------+------------------+--------+           PSV cm/sEDV cm/sStenosisPlaque DescriptionComments +----------+--------+--------+--------+------------------+--------+ CCA Prox  77      9               heterogenous               +----------+--------+--------+--------+------------------+--------+ CCA Distal65      14              heterogenous               +----------+--------+--------+--------+------------------+--------+ ICA Prox  39      13      1-39%   heterogenous               +----------+--------+--------+--------+------------------+--------+ ICA Distal37      14                                         +----------+--------+--------+--------+------------------+--------+ ECA       69      11                                         +----------+--------+--------+--------+------------------+--------+ +----------+--------+-------+--------+-------------------+           PSV cm/sEDV cmsDescribeArm Pressure (mmHG) +----------+--------+-------+--------+-------------------+ [id number]                                         +----------+--------+-------+--------+-------------------+ +---------+--------+--+--------+-+---------+ VertebralPSV cm/s31EDV cm/s9Antegrade +---------+--------+--+--------+-+---------+  Left Carotid  Findings: +----------+--------+--------+--------+------------------+--------+           PSV cm/sEDV cm/sStenosisPlaque DescriptionComments +----------+--------+--------+--------+------------------+--------+ CCA Prox  118     22              heterogenous               +----------+--------+--------+--------+------------------+--------+ CCA Distal63      21              heterogenous               +----------+--------+--------+--------+------------------+--------+ ICA Prox  53      22      1-39%   heterogenous               +----------+--------+--------+--------+------------------+--------+ ICA Distal65      26                                         +----------+--------+--------+--------+------------------+--------+ ECA       73      18                                         +----------+--------+--------+--------+------------------+--------+ +----------+--------+--------+--------+-------------------+  PSV cm/sEDV cm/sDescribeArm Pressure (mmHG) +----------+--------+--------+--------+-------------------+ [id number]                                          +----------+--------+--------+--------+-------------------+ +---------+--------+--+--------+-+---------+ VertebralPSV cm/s32EDV cm/s9Antegrade +---------+--------+--+--------+-+---------+   Summary: Right Carotid: Velocities in the right ICA are consistent with a 1-39% stenosis. Left Carotid: Velocities in the left ICA are consistent with a 1-39% stenosis. Vertebrals: Bilateral vertebral arteries demonstrate antegrade flow. *See table(s) above for measurements and observations.  Electronically signed by Antony Contras MD on 12/17/2020 at 1:25:39 PM.    Final      Subjective: No concerns. Looking forward to quitting cocaine use.  Discharge Exam: Vitals:   12/21/20 0850 12/21/20 1235  BP: 129/87 103/68  Pulse: (!) 104 85  Resp: 20 18  Temp: 98.2 F (36.8 C) 98 F (36.7 C)  SpO2: 98% 99%    Vitals:   12/21/20 0020 12/21/20 0346 12/21/20 0850 12/21/20 1235  BP: 105/87 114/75 129/87 103/68  Pulse: (!) 101 (!) 105 (!) 104 85  Resp: 18 20 20 18   Temp: 97.9 F (36.6 C) 99.5 F (37.5 C) 98.2 F (36.8 C) 98 F (36.7 C)  TempSrc: Oral Oral Oral Oral  SpO2: 98% 96% 98% 99%  Weight:      Height:        General: Pt is alert, awake, not in acute distress Cardiovascular: RRR, S1/S2 +, no rubs, no gallops Respiratory: CTA bilaterally, no wheezing, no rhonchi Abdominal: Soft, NT, ND, bowel sounds + Extremities: no edema, no cyanosis    The results of significant diagnostics from this hospitalization (including imaging, microbiology, ancillary and laboratory) are listed below for reference.     Microbiology: Recent Results (from the past 240 hour(s))  Resp Panel by RT-PCR (Flu A&B, Covid) Nasopharyngeal Swab     Status: None   Collection Time: 12/11/20  7:46 PM   Specimen: Nasopharyngeal Swab; Nasopharyngeal(NP) swabs in vial transport medium  Result Value Ref Range Status   SARS Coronavirus 2 by RT PCR NEGATIVE NEGATIVE Final    Comment: (NOTE) SARS-CoV-2 target nucleic acids are NOT DETECTED.  The SARS-CoV-2 RNA is generally detectable in upper respiratory specimens during the acute phase of infection. The lowest concentration of SARS-CoV-2 viral copies this assay can detect is 138 copies/mL. A negative result does not preclude SARS-Cov-2 infection and should not be used as the sole basis for treatment or other patient management decisions. A negative result may occur with  improper specimen collection/handling, submission of specimen other than nasopharyngeal swab, presence of viral mutation(s) within the areas targeted by this assay, and inadequate number of viral copies(<138 copies/mL). A negative result must be combined with clinical observations, patient history, and epidemiological information. The expected result is Negative.  Fact Sheet for Patients:   EntrepreneurPulse.com.au  Fact Sheet for Healthcare Providers:  IncredibleEmployment.be  This test is no t yet approved or cleared by the [place] FDA and  has been authorized for detection and/or diagnosis of SARS-CoV-2 by FDA under an Emergency Use Authorization (EUA). This EUA will remain  in effect (meaning this test can be used) for the duration of the COVID-19 declaration under Section 564(b)(1) of the Act, 21 U.S.C.section 360bbb-3(b)(1), unless the authorization is terminated  or revoked sooner.       Influenza A by PCR NEGATIVE NEGATIVE Final   Influenza B by PCR NEGATIVE NEGATIVE Final  Comment: (NOTE) The Xpert Xpress SARS-CoV-2/FLU/RSV plus assay is intended as an aid in the diagnosis of influenza from Nasopharyngeal swab specimens and should not be used as a sole basis for treatment. Nasal washings and aspirates are unacceptable for Xpert Xpress SARS-CoV-2/FLU/RSV testing.  Fact Sheet for Patients: EntrepreneurPulse.com.au  Fact Sheet for Healthcare Providers: IncredibleEmployment.be  This test is not yet approved or cleared by the [place] FDA and has been authorized for detection and/or diagnosis of SARS-CoV-2 by FDA under an Emergency Use Authorization (EUA). This EUA will remain in effect (meaning this test can be used) for the duration of the COVID-19 declaration under Section 564(b)(1) of the Act, 21 U.S.C. section 360bbb-3(b)(1), unless the authorization is terminated or revoked.  Performed at [facility], [place] [address], [place], [place] [address]   MRSA Next Gen by PCR, Nasal     Status: None   Collection Time: 12/11/20  9:36 PM   Specimen: Nasal Mucosa; Nasal Swab  Result Value Ref Range Status   MRSA by PCR Next Gen NOT DETECTED NOT DETECTED Final    Comment: (NOTE) The GeneXpert MRSA Assay (FDA approved for NASAL specimens only), is one  component of a comprehensive MRSA colonization surveillance program. It is not intended to diagnose MRSA infection nor to guide or monitor treatment for MRSA infections. Test performance is not FDA approved in patients less than 49 years old. Performed at [facility], [place] [address], [place], [place] [address]   Culture, blood (Routine X 2) w Reflex to ID Panel     Status: None (Preliminary result)   Collection Time: 12/16/20 11:35 PM   Specimen: BLOOD LEFT WRIST  Result Value Ref Range Status   Specimen Description BLOOD LEFT WRIST  Final   Special Requests   Final    BOTTLES DRAWN AEROBIC AND ANAEROBIC Blood Culture adequate volume   Culture   Final    NO GROWTH 4 DAYS Performed at [facility], [place] [address] [place], [place] [address]    Report Status PENDING  Incomplete  Culture, blood (Routine X 2) w Reflex to ID Panel     Status: Abnormal   Collection Time: 12/16/20 11:50 PM   Specimen: BLOOD RIGHT HAND  Result Value Ref Range Status   Specimen Description BLOOD RIGHT HAND  Final   Special Requests   Final    BOTTLES DRAWN AEROBIC AND ANAEROBIC Blood Culture results may not be optimal due to an inadequate volume of blood received in culture bottles   Culture  Setup Time   Final    GRAM NEGATIVE RODS AEROBIC BOTTLE ONLY CRITICAL RESULT CALLED TO, READ BACK BY AND VERIFIED WITH: PHARMD JEREMY F. [id number] [id number] FCP CORRECTED RESULTS GRAM VARIABLE ROD PREVIOUSLY REPORTED AS: GRAM NEGATIVE RODS CORRECTED RESULTS CALLED TO: PHARMD J.FREINS AT 0841 ON 12/21/2020 BY T.SAAD    Culture (A)  Final    DIPHTHEROIDS(CORYNEBACTERIUM SPECIES) Standardized susceptibility testing for this organism is not available. Performed at [facility], [place] [address], [place], [place] [address]    Report Status 12/21/2020 FINAL  Final  Blood Culture ID Panel (Reflexed)     Status: None   Collection Time: 12/16/20 11:50 PM  Result Value Ref Range Status   Enterococcus faecalis  NOT DETECTED NOT DETECTED Final   Enterococcus Faecium NOT DETECTED NOT DETECTED Final   Listeria monocytogenes NOT DETECTED NOT DETECTED Final   Staphylococcus species NOT DETECTED NOT DETECTED Final   Staphylococcus aureus (  BCID) NOT DETECTED NOT DETECTED Final   Staphylococcus epidermidis NOT DETECTED NOT DETECTED Final   Staphylococcus lugdunensis NOT DETECTED NOT DETECTED Final   Streptococcus species NOT DETECTED NOT DETECTED Final   Streptococcus agalactiae NOT DETECTED NOT DETECTED Final   Streptococcus pneumoniae NOT DETECTED NOT DETECTED Final   Streptococcus pyogenes NOT DETECTED NOT DETECTED Final   A.calcoaceticus-baumannii NOT DETECTED NOT DETECTED Final   Bacteroides fragilis NOT DETECTED NOT DETECTED Final   Enterobacterales NOT DETECTED NOT DETECTED Final   Enterobacter cloacae complex NOT DETECTED NOT DETECTED Final   Escherichia coli NOT DETECTED NOT DETECTED Final   Klebsiella aerogenes NOT DETECTED NOT DETECTED Final   Klebsiella oxytoca NOT DETECTED NOT DETECTED Final   Klebsiella pneumoniae NOT DETECTED NOT DETECTED Final   Proteus species NOT DETECTED NOT DETECTED Final   Salmonella species NOT DETECTED NOT DETECTED Final   Serratia marcescens NOT DETECTED NOT DETECTED Final   Haemophilus influenzae NOT DETECTED NOT DETECTED Final   Neisseria meningitidis NOT DETECTED NOT DETECTED Final   Pseudomonas aeruginosa NOT DETECTED NOT DETECTED Final   Stenotrophomonas maltophilia NOT DETECTED NOT DETECTED Final   Candida albicans NOT DETECTED NOT DETECTED Final   Candida auris NOT DETECTED NOT DETECTED Final   Candida glabrata NOT DETECTED NOT DETECTED Final   Candida krusei NOT DETECTED NOT DETECTED Final   Candida parapsilosis NOT DETECTED NOT DETECTED Final   Candida tropicalis NOT DETECTED NOT DETECTED Final   Cryptococcus neoformans/gattii NOT DETECTED NOT DETECTED Final    Comment: Performed at [facility], [address], [place], [place] [address]      Labs: BNP (last 3 results) No results for input(s): BNP in the last 8760 hours. Basic Metabolic Panel: Recent Labs  Lab 12/15/20 0033 12/16/20 2358 12/17/20 0932 12/18/20 1012  NA 135 134* 133* 136  K 4.0 4.0 3.2* 3.3*  CL 103 101 100 105  CO2 21* 26 20* 21*  GLUCOSE 120* 139* 181* 119*  BUN 8 9 10 16   CREATININE 1.05 1.18 1.49* 1.51*  CALCIUM 9.6 9.9 10.1 10.0  MG  --  2.1  --  2.2   Liver Function Tests: Recent Labs  Lab 12/17/20 0932  AST 46*  ALT 75*  ALKPHOS 86  BILITOT 1.2  PROT 7.8  ALBUMIN 4.2   No results for input(s): LIPASE, AMYLASE in the last 168 hours. Recent Labs  Lab 12/17/20 0932  AMMONIA 39*   CBC: Recent Labs  Lab 12/15/20 0033 12/16/20 2358 12/17/20 0932  WBC 10.6* 8.1 8.4  NEUTROABS 7.8* 5.5  --   HGB 13.7 14.8 15.2  HCT 42.5 44.0 44.9  MCV 93.6 91.1 90.2  PLT 195 211 196   Cardiac Enzymes: No results for input(s): CKTOTAL, CKMB, CKMBINDEX, TROPONINI in the last 168 hours. BNP: Invalid input(s): POCBNP CBG: Recent Labs  Lab 12/20/20 1232 12/20/20 1653 12/20/20 2101 12/21/20 0620 12/21/20 1231  GLUCAP 154* 146* 136* 139* 161*   D-Dimer No results for input(s): DDIMER in the last 72 hours. Hgb A1c No results for input(s): HGBA1C in the last 72 hours. Lipid Profile No results for input(s): CHOL, HDL, LDLCALC, TRIG, CHOLHDL, LDLDIRECT in the last 72 hours. Thyroid function studies No results for input(s): TSH, T4TOTAL, T3FREE, THYROIDAB in the last 72 hours.  Invalid input(s): FREET3 Anemia work up No results for input(s): VITAMINB12, FOLATE, FERRITIN, TIBC, IRON, RETICCTPCT in the last 72 hours. Urinalysis    Component Value Date/Time   COLORURINE YELLOW 12/16/2020 0446  APPEARANCEUR CLEAR 12/16/2020 0446   LABSPEC 1.011 12/16/2020 0446   PHURINE 6.0 12/16/2020 0446   GLUCOSEU NEGATIVE 12/16/2020 0446   HGBUR NEGATIVE 12/16/2020 0446   BILIRUBINUR NEGATIVE 12/16/2020 0446   KETONESUR NEGATIVE 12/16/2020 0446    PROTEINUR NEGATIVE 12/16/2020 0446   UROBILINOGEN 0.2 01/29/2012 1733   NITRITE NEGATIVE 12/16/2020 0446   LEUKOCYTESUR NEGATIVE 12/16/2020 0446   Sepsis Labs Invalid input(s): PROCALCITONIN,  WBC,  LACTICIDVEN Microbiology Recent Results (from the past 240 hour(s))  Resp Panel by RT-PCR (Flu A&B, Covid) Nasopharyngeal Swab     Status: None   Collection Time: 12/11/20  7:46 PM   Specimen: Nasopharyngeal Swab; Nasopharyngeal(NP) swabs in vial transport medium  Result Value Ref Range Status   SARS Coronavirus 2 by RT PCR NEGATIVE NEGATIVE Final    Comment: (NOTE) SARS-CoV-2 target nucleic acids are NOT DETECTED.  The SARS-CoV-2 RNA is generally detectable in upper respiratory specimens during the acute phase of infection. The lowest concentration of SARS-CoV-2 viral copies this assay can detect is 138 copies/mL. A negative result does not preclude SARS-Cov-2 infection and should not be used as the sole basis for treatment or other patient management decisions. A negative result may occur with  improper specimen collection/handling, submission of specimen other than nasopharyngeal swab, presence of viral mutation(s) within the areas targeted by this assay, and inadequate number of viral copies(<138 copies/mL). A negative result must be combined with clinical observations, patient history, and epidemiological information. The expected result is Negative.  Fact Sheet for Patients:  EntrepreneurPulse.com.au  Fact Sheet for Healthcare Providers:  IncredibleEmployment.be  This test is no t yet approved or cleared by the [place] FDA and  has been authorized for detection and/or diagnosis of SARS-CoV-2 by FDA under an Emergency Use Authorization (EUA). This EUA will remain  in effect (meaning this test can be used) for the duration of the COVID-19 declaration under Section 564(b)(1) of the Act, 21 U.S.C.section 360bbb-3(b)(1), unless the  authorization is terminated  or revoked sooner.       Influenza A by PCR NEGATIVE NEGATIVE Final   Influenza B by PCR NEGATIVE NEGATIVE Final    Comment: (NOTE) The Xpert Xpress SARS-CoV-2/FLU/RSV plus assay is intended as an aid in the diagnosis of influenza from Nasopharyngeal swab specimens and should not be used as a sole basis for treatment. Nasal washings and aspirates are unacceptable for Xpert Xpress SARS-CoV-2/FLU/RSV testing.  Fact Sheet for Patients: EntrepreneurPulse.com.au  Fact Sheet for Healthcare Providers: IncredibleEmployment.be  This test is not yet approved or cleared by the [place] FDA and has been authorized for detection and/or diagnosis of SARS-CoV-2 by FDA under an Emergency Use Authorization (EUA). This EUA will remain in effect (meaning this test can be used) for the duration of the COVID-19 declaration under Section 564(b)(1) of the Act, 21 U.S.C. section 360bbb-3(b)(1), unless the authorization is terminated or revoked.  Performed at [facility], [place] [address] [place], [place] [address]   MRSA Next Gen by PCR, Nasal     Status: None   Collection Time: 12/11/20  9:36 PM   Specimen: Nasal Mucosa; Nasal Swab  Result Value Ref Range Status   MRSA by PCR Next Gen NOT DETECTED NOT DETECTED Final    Comment: (NOTE) The GeneXpert MRSA Assay (FDA approved for NASAL specimens only), is one component of a comprehensive MRSA colonization surveillance program. It is not intended to diagnose MRSA infection nor to guide or monitor treatment for MRSA infections. Test performance  is not FDA approved in patients less than 18 years old. Performed at [facility], [place] [address], [place], [place] [address]   Culture, blood (Routine X 2) w Reflex to ID Panel     Status: None (Preliminary result)   Collection Time: 12/16/20 11:35 PM   Specimen: BLOOD LEFT WRIST  Result Value Ref Range Status    Specimen Description BLOOD LEFT WRIST  Final   Special Requests   Final    BOTTLES DRAWN AEROBIC AND ANAEROBIC Blood Culture adequate volume   Culture   Final    NO GROWTH 4 DAYS Performed at [facility], [place] [address] [place], [place] [address]    Report Status PENDING  Incomplete  Culture, blood (Routine X 2) w Reflex to ID Panel     Status: Abnormal   Collection Time: 12/16/20 11:50 PM   Specimen: BLOOD RIGHT HAND  Result Value Ref Range Status   Specimen Description BLOOD RIGHT HAND  Final   Special Requests   Final    BOTTLES DRAWN AEROBIC AND ANAEROBIC Blood Culture results may not be optimal due to an inadequate volume of blood received in culture bottles   Culture  Setup Time   Final    GRAM NEGATIVE RODS AEROBIC BOTTLE ONLY CRITICAL RESULT CALLED TO, READ BACK BY AND VERIFIED WITH: PHARMD JEREMY F. [id number] FCP CORRECTED RESULTS GRAM VARIABLE ROD PREVIOUSLY REPORTED AS: GRAM NEGATIVE RODS CORRECTED RESULTS CALLED TO: PHARMD J.FREINS AT 0841 ON 12/21/2020 BY T.SAAD    Culture (A)  Final    DIPHTHEROIDS(CORYNEBACTERIUM SPECIES) Standardized susceptibility testing for this organism is not available. Performed at [facility], [place] [address], [place], [place] [address]    Report Status 12/21/2020 FINAL  Final  Blood Culture ID Panel (Reflexed)     Status: None   Collection Time: 12/16/20 11:50 PM  Result Value Ref Range Status   Enterococcus faecalis NOT DETECTED NOT DETECTED Final   Enterococcus Faecium NOT DETECTED NOT DETECTED Final   Listeria monocytogenes NOT DETECTED NOT DETECTED Final   Staphylococcus species NOT DETECTED NOT DETECTED Final   Staphylococcus aureus (BCID) NOT DETECTED NOT DETECTED Final   Staphylococcus epidermidis NOT DETECTED NOT DETECTED Final   Staphylococcus lugdunensis NOT DETECTED NOT DETECTED Final   Streptococcus species NOT DETECTED NOT DETECTED Final   Streptococcus agalactiae NOT DETECTED NOT DETECTED Final    Streptococcus pneumoniae NOT DETECTED NOT DETECTED Final   Streptococcus pyogenes NOT DETECTED NOT DETECTED Final   A.calcoaceticus-baumannii NOT DETECTED NOT DETECTED Final   Bacteroides fragilis NOT DETECTED NOT DETECTED Final   Enterobacterales NOT DETECTED NOT DETECTED Final   Enterobacter cloacae complex NOT DETECTED NOT DETECTED Final   Escherichia coli NOT DETECTED NOT DETECTED Final   Klebsiella aerogenes NOT DETECTED NOT DETECTED Final   Klebsiella oxytoca NOT DETECTED NOT DETECTED Final   Klebsiella pneumoniae NOT DETECTED NOT DETECTED Final   Proteus species NOT DETECTED NOT DETECTED Final   Salmonella species NOT DETECTED NOT DETECTED Final   Serratia marcescens NOT DETECTED NOT DETECTED Final   Haemophilus influenzae NOT DETECTED NOT DETECTED Final   Neisseria meningitidis NOT DETECTED NOT DETECTED Final   Pseudomonas aeruginosa NOT DETECTED NOT DETECTED Final   Stenotrophomonas maltophilia NOT DETECTED NOT DETECTED Final   Candida albicans NOT DETECTED NOT DETECTED Final   Candida auris NOT DETECTED NOT DETECTED Final   Candida glabrata NOT DETECTED NOT DETECTED Final   Candida krusei NOT DETECTED NOT DETECTED Final  Candida parapsilosis NOT DETECTED NOT DETECTED Final   Candida tropicalis NOT DETECTED NOT DETECTED Final   Cryptococcus neoformans/gattii NOT DETECTED NOT DETECTED Final    Comment: Performed at [facility], [address], [place], [place] [address]     Time coordinating discharge: 35 minutes  SIGNED:   Cordelia Poche, MD [facility] 12/21/2020, 3:09 PM

## 2020-12-21 NOTE — TOC Transition Note (Signed)
Transition of Care [facility]) - CM/SW Discharge Note   Patient Details  Name: Matthew Francis MRN: [id number] Date of Birth: 01-Sep-1960  Transition of Care [facility]) CM/SW Contact:  Pollie Friar, RN Phone Number: 12/21/2020, 12:45 PM   Clinical Narrative:    Patient discharging to CIR today. CM signing off.   Final next level of care: IP Rehab Facility Barriers to Discharge: No Barriers Identified   Patient Goals and CMS Choice        Discharge Placement                       Discharge Plan and Services                                     Social Determinants of Health (SDOH) Interventions     Readmission Risk Interventions No flowsheet data found.

## 2020-12-21 NOTE — Progress Notes (Signed)
PT Cancellation Note  Patient Details Name: Matthew Francis MRN: [id number] DOB: Jun 03, 1961   Cancelled Treatment:    Reason Eval/Treat Not Completed: Fatigue/lethargy limiting ability to participate;Patient declined, no reason specified Attempted multiple times to wake patient up. Patient opened eyes then quickly returned to sleep. Patient previously worked with OT this AM and has plans to d/c to CIR this PM. PT will re-attempt as time allows.  Mercy Malena A. Gilford Rile PT, DPT Acute Rehabilitation Services Pager [phone] Office [phone]    Linna Hoff 12/21/2020, 2:18 PM

## 2020-12-21 NOTE — Progress Notes (Signed)
Inpatient Rehabilitation  Patient information reviewed and entered into eRehab system by Estle Huguley M. Cythia Bachtel, M.A., CCC/SLP, PPS Coordinator.  Information including medical coding, functional ability and quality indicators will be reviewed and updated through discharge.    

## 2020-12-21 NOTE — Progress Notes (Signed)
Meredith Staggers, MD   Physician  Physical Medicine and Rehabilitation  PMR Pre-admission      Signed  Date of Service:  12/21/2020 12:53 PM       Related encounter: ED to Hosp-Admission (Discharged) from 12/11/2020 in [facility]       Signed          Show:Clear all [x] Written[x] Templated[x] Copied  Added by: [x] Cristina Gong, RN[x] Meredith Staggers, MD   [] Hover for details                                                                                                                                                                                                                                                                                                                                                                                                                                                           PMR Admission Coordinator Pre-Admission Assessment   Patient: Matthew SHOULTZ is an 60 y.o., male MRN: [id number] DOB: 01-03-1961 Height: 5' 11"  (180.3 cm) Weight: 89.2 kg Insurance Information HMO:     PPO:      PCP:      IPA:      80/20:      OTHER: PRIMARY: [place]      Policy#: [id number]  Subscriber: pt CM Name: via fax      Phone#: [phone]     Fax#: [phone] Pre-Cert#: [id number] approved for 7 days      Employer: Benefits:  Phone #: [phone]     Name: 7/7 Eff. Date: 06/17/2019     Deduct: none      Out of Pocket Max: $900      Life Max: none CIR: 90%      SNF: 90% Outpatient: $10 copay per visit     Co-Pay: Home Health: 90%      Co-Pay: 10% DME: 90%     Co-Pay: RENTAL only Providers: in network none SECONDARY:       Policy#:      Phone#:   Development worker, community:       Phone#:   [employer], [employer]" for patients in Inpatient Rehabilitation Facilities  with attached "Privacy Act [place] Records" was provided and verbally reviewed with: N/A   Emergency Contact Information Contact Information       Name Relation Home Work Mobile    [place] Father [phone]        Hortense Ramal [phone]               Current Medical History  Patient Admitting Diagnosis:ICH   History of Present Illness:  60 year old right-handed male history of hypertension, obesity with BMI 27.43, ulcerative colitis as well as hyperlipidemia.   Presented 12/11/2020 with headache as well as nausea and vomiting.  Admission chemistries unremarkable aside glucose 125, creatinine 1.31, hemoglobin A1c 5.9, urine drug screen positive cocaine, ammonia level 39.  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  No midline shift.  MRA was unremarkable.  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  Echocardiogram with ejection fraction of 60 to 50% grade 1 diastolic dysfunction.  EEG negative for seizure.  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  Dr. Kathyrn Sheriff follow-up for right caudate hemorrhage as well as intraventricular extension question hydrocephalus no current plan for EVD.  He is tolerating a regular consistency diet.  Blood culture 12/16/2020 gram-negative rods found in the 1 culture.  He was placed on Rocephin empirically and awaiting plan for duration of antibiotic.  Patient with intermittent bouts of restlessness and agitation placed on Risperdal.    Complete NIHSS TOTAL: 0   Patient's medical record from [facility]  has been reviewed by the rehabilitation admission  coordinator and physician.   Past Medical History      Past Medical History:  Diagnosis Date   Arthritis     GERD (gastroesophageal reflux disease)     H/O ulcerative colitis     Hyperlipidemia     Hypertension     Inguinal hernia     Ulcer      ulcerative colitis   Ulcerative colitis     Wears glasses        Family History   family history includes Cancer in his sister.   Prior Rehab/Hospitalizations Has the patient had prior rehab or hospitalizations prior to admission? Yes   Has the patient had major surgery during 100 days prior to admission? No               Current Medications  Current Facility-Administered Medications:   acetaminophen (TYLENOL) tablet 650 mg, 650 mg, Oral, Q6H PRN, Bhagat, Srishti L, MD, 650 mg at 12/21/20 1002   amLODipine (NORVASC) tablet 10 mg, 10 mg, Oral, Daily, Mariel Aloe, MD, 10 mg at 12/21/20 1004   budesonide (ENTOCORT EC) 24 hr capsule 9 mg, 9 mg, Oral, Daily, Gwinda Maine, MD, 9 mg at 12/21/20 1004   cloNIDine (CATAPRES) tablet 0.1 mg, 0.1 mg, Oral, Daily, Mariel Aloe, MD, 0.1 mg at 12/21/20 1004   haloperidol lactate (HALDOL) injection 2 mg, 2 mg, Intravenous, Q6H PRN, Lavina Hamman, MD, 2 mg at 12/18/20 2339   hydrALAZINE (APRESOLINE) injection 10 mg, 10 mg, Intravenous, Q4H PRN, Lavina Hamman, MD   hydrALAZINE (APRESOLINE) tablet 100 mg, 100 mg, Oral, BID, Mariel Aloe, MD, 100 mg at 12/21/20 1004   HYDROcodone-acetaminophen (NORCO/VICODIN) 5-325 MG per tablet 1-2 tablet, 1-2 tablet, Oral, Q6H PRN, Mariel Aloe, MD   insulin aspart (novoLOG) injection 0-9 Units, 0-9 Units, Subcutaneous, TID AC & HS, Rosalin Hawking, MD, 1 Units at 12/21/20 0901   methocarbamol (ROBAXIN) tablet 500 mg, 500 mg, Oral, Q8H PRN, Lavina Hamman, MD, 500 mg at 12/21/20 1002   multivitamin with minerals tablet 1 tablet, 1 tablet, Oral, Daily, Rosalin Hawking, MD, 1 tablet at 12/21/20 1003   ondansetron (ZOFRAN) injection 4 mg, 4 mg, Intravenous,  Q6H PRN, Bhagat, Srishti L, MD, 4 mg at 12/21/20 0212   pantoprazole (PROTONIX) EC tablet 40 mg, 40 mg, Oral, Daily, Rosalin Hawking, MD, 40 mg at 12/21/20 1003   rifaximin (XIFAXAN) tablet 550 mg, 550 mg, Oral, BID, Lavina Hamman, MD, 550 mg at 12/21/20 1004   risperiDONE (RISPERDAL) tablet 1 mg, 1 mg, Oral, BID, Lavina Hamman, MD, 1 mg at 12/21/20 1004   Vitamin D (Ergocalciferol) (DRISDOL) capsule 50,000 Units, 50,000 Units, Oral, Q Kathaleen Bury, RPH, 50,000 Units at 12/19/20 1059   white petrolatum (VASELINE) gel, , Topical, PRN, Rosalin Hawking, MD, 0.2 application at 62/94/76 2355   Patients Current Diet:  Diet Order                  Diet 2 gram sodium Room service appropriate? Yes; Fluid consistency: Thin  Diet effective now                         Precautions / Restrictions Precautions Precautions: Fall Precaution Comments: SBP < 140, behavior Restrictions Weight Bearing Restrictions: No    Has the patient had 2 or more falls or a fall with injury in the past year? No   Prior Activity Level Community (5-7x/wk): Independent; training for a new job   Prior Functional Level Self Care: Did the patient need help bathing, dressing, using the toilet or eating? Independent   Indoor Mobility: Did the patient need assistance with walking from room to room (with or without device)? Independent   Stairs: Did the patient need assistance with internal or external stairs (with or without device)? Independent   Functional Cognition: Did the patient need help planning regular tasks such as shopping or remembering to take medications? Independent   Home Assistive Devices / Equipment Home Assistive Devices/Equipment: None Home Equipment: None   Prior Device Use: Indicate devices/aids used by the patient prior to current illness, exacerbation or injury? None of the above   Current Functional Level Cognition   Overall Cognitive Status: Impaired/Different from baseline Current  Attention Level: Sustained Orientation  Level: Oriented X4 Following Commands: Follows one step commands with increased time Safety/Judgement: Decreased awareness of safety, Decreased awareness of deficits General Comments: Disoriented to place this session, requiring 2 attempts to reorient. STM deficits noted with providing room number and engaged in conversation during ambulation, patient unable to recall correct room number or identify room with familiar objects present Attention: Sustained, Focused Sustained Attention: Impaired Sustained Attention Impairment: Functional basic, Verbal basic Memory: Impaired Memory Impairment: Storage deficit (did not recall use of call bell - which button to push, etc) Awareness: Impaired (trying to get OOB although he requires significant assistance) Problem Solving: Impaired (trying to get OOB) Problem Solving Impairment: Functional basic    Extremity Assessment (includes Sensation/Coordination)   Upper Extremity Assessment: Generalized weakness LUE Deficits / Details: mild coodrdination noted but functional. Pt states he "doesn't really notice anything" LUE Sensation: WNL LUE Coordination: decreased gross motor  Lower Extremity Assessment: Defer to PT evaluation LLE Deficits / Details: unable to accurately assess as pt not consistently following commands. No L knee buckling in standing but seemed unsteady on L side LLE Sensation: WNL LLE Coordination: decreased gross motor     ADLs   Overall ADL's : Needs assistance/impaired Grooming: Oral care, Standing, Supervision/safety, Wash/dry face, Wash/dry hands Upper Body Dressing : Minimal assistance, Standing Lower Body Dressing: Set up, Sitting/lateral leans Toilet Transfer: Supervision/safety, RW, Ambulation Functional mobility during ADLs: Supervision/safety, Rolling walker General ADL Comments: increased complaints of back pain today     Mobility   Overal bed mobility: Needs Assistance Bed  Mobility: Supine to Sit Supine to sit: Min assist Sit to supine: Min assist General bed mobility comments: minA to return LEs into bed and trunk elevation to EOB     Transfers   Overall transfer level: Needs assistance Equipment used: Rolling walker (2 wheeled) Transfers: Sit to/from Stand Sit to Stand: Min assist Squat pivot transfers: Min guard General transfer comment: minA to rise and steady from low surface. Complains of back pain upon standing requiring RW     Ambulation / Gait / Stairs / Wheelchair Mobility   Ambulation/Gait Ambulation/Gait assistance: Min assist, Counsellor (Feet): 200 Feet Assistive device: Rolling walker (2 wheeled) Gait Pattern/deviations: Step-through pattern, Decreased stride length, Drifts right/left, Wide base of support General Gait Details: Use of RW this date due to increased back pain upon standing and patient with forward flexed posture without AD. Improved posture with RW present. MinA for balance, moments of min guard Gait velocity: WNL Gait velocity interpretation: <1.31 ft/sec, indicative of household ambulator     Posture / Balance Dynamic Sitting Balance Sitting balance - Comments: supervision needed for safety Balance Overall balance assessment: Needs assistance Sitting-balance support: Feet supported, Bilateral upper extremity supported Sitting balance-Leahy Scale: Good Sitting balance - Comments: supervision needed for safety Standing balance support: Bilateral upper extremity supported, During functional activity Standing balance-Leahy Scale: Poor Standing balance comment: reliant on external assist Standardized Balance Assessment Standardized Balance Assessment : Dynamic Gait Index Dynamic Gait Index Level Surface: Normal Change in Gait Speed: Normal Gait with Horizontal Head Turns: Normal Gait with Vertical Head Turns: Mild Impairment Gait and Pivot Turn: Normal Step Over Obstacle: Normal Step Around Obstacles:  Normal Steps: Normal Total Score: 23     Special needs/care consideration      Previous Home Environment  Living Arrangements: Alone  Lives With: Alone Available Help at Discharge: Available PRN/intermittently (Aunt prn and other friends) Type of Home: House Home Layout: One level Home Access: Stairs  to enter Entrance Stairs-Rails: None Entrance Stairs-Number of Steps: 1 Bathroom Shower/Tub: Optometrist: Yes How Accessible: Accessible via walker Home Care Services: No   Discharge Living Setting Plans for Discharge Living Setting: Patient's home, Alone Type of Home at Discharge: House Discharge Home Layout: One level Discharge Home Access: Stairs to enter Entrance Stairs-Rails: None Entrance Stairs-Number of Steps: 1 Discharge Bathroom Shower/Tub: Tub/shower unit Discharge Bathroom Toilet: Standard Discharge Bathroom Accessibility: Yes How Accessible: Accessible via walker Does the patient have any problems obtaining your medications?: Yes (Describe)   Social/Family/Support Systems Contact Information: aunt [name] Anticipated Caregiver: aunt and friend prn Anticipated Caregiver's Contact Information: see above Caregiver Availability: Intermittent Discharge Plan Discussed with Primary Caregiver: Yes Is Caregiver In Agreement with Plan?: Yes Does Caregiver/Family have Issues with Lodging/Transportation while Pt is in Rehab?: No   Goals Patient/Family Goal for Rehab: Mod I with PT, OT and SLP Expected length of stay: ELOS 7 to 10 days Pt/Family Agrees to Admission and willing to participate: Yes Program Orientation Provided & Reviewed with Pt/Caregiver Including Roles  & Responsibilities: Yes   Decrease burden of Care through IP rehab admission: n/a   Possible need for SNF placement upon discharge: not anticipated   Patient Condition: I have reviewed medical records from [facility], spoken with CM, and  patient and family member. I met with patient at the bedside for inpatient rehabilitation assessment.  Patient will benefit from ongoing PT, OT, and SLP, can actively participate in 3 hours of therapy a day 5 days of the week, and can make measurable gains during the admission.  Patient will also benefit from the coordinated team approach during an Inpatient Acute Rehabilitation admission.  The patient will receive intensive therapy as well as Rehabilitation physician, nursing, social worker, and care management interventions.  Due to bladder management, bowel management, safety, skin/wound care, disease management, medication administration, pain management, and patient education the patient requires 24 hour a day rehabilitation nursing.  The patient is currently min assist overall with mobility and basic ADLs.  Discharge setting and therapy post discharge at home with home health is anticipated.  Patient has agreed to participate in the Acute Inpatient Rehabilitation Program and will admit today.   Preadmission Screen Completed By:  Cleatrice Burke, 12/21/2020 12:54 PM ______________________________________________________________________   Discussed status with Dr. Naaman Plummer on 12/21/2020 at 1300 and received approval for admission today.   Admission Coordinator:  Cleatrice Burke, RN, time 1300 Date 12/21/2020    Assessment/Plan: Diagnosis: right BG hemorrhage with left  hemiparesis Does the need for close, 24 hr/day Medical supervision in concert with the patient's rehab needs make it unreasonable for this patient to be served in a less intensive setting? Yes Co-Morbidities requiring supervision/potential complications: HTN, obesity, UC Due to bladder management, bowel management, safety, skin/wound care, disease management, medication administration, pain management, and patient education, does the patient require 24 hr/day rehab nursing? Yes Does the patient require coordinated care of a  physician, rehab nurse, PT, OT, and SLP to address physical and functional deficits in the context of the above medical diagnosis(es)? Yes Addressing deficits in the following areas: balance, endurance, locomotion, strength, transferring, bowel/bladder control, bathing, dressing, feeding, grooming, toileting, cognition, speech, and psychosocial support Can the patient actively participate in an intensive therapy program of at least 3 hrs of therapy 5 days a week? Yes The potential for patient to make measurable gains while on inpatient rehab is excellent Anticipated functional outcomes upon discharge  from inpatient rehab: modified independent PT, modified independent OT, modified independent SLP Estimated rehab length of stay to reach the above functional goals is: 7-10 days Anticipated discharge destination: Home 10. Overall Rehab/Functional Prognosis: excellent     MD Signature: Meredith Staggers, MD, [place] [facility] 12/21/2020           Revision History                        Note Details  Author Meredith Staggers, MD File Time 12/21/2020  1:08 PM  Author Type Physician Status Signed  Last Editor Meredith Staggers, MD Service Physical Medicine and [place] # [id number] Admit Date 12/21/2020

## 2020-12-21 NOTE — Progress Notes (Signed)
Occupational Therapy Treatment Patient Details Name: Matthew Francis MRN: [id number] DOB: Aug 21, 1960 Today's Date: 12/21/2020    History of present illness Pt is a 60 y.o. male who presented 6/28 with AMS, nausea, and headache. 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 PMH: arthritis, GERD, HTN, inguinal hernia, and ulcerative colitis.   OT comments  Patient making continued progress toward patient focused goals.  He did need additional assist for bed mobility and sit to stand, up to Min A, but was supervision at [facility] level for toileting.  Patient notes increased low back pain and stiffness.  Setup and supervision to perform standing grooming task.  CIR continues to be recommended for post acute rehab.  Given his age, prior level of function, and capacity to participate, he should progress quickly.  OT will continue to follow acutely.    Follow Up Recommendations  CIR    Equipment Recommendations  3 in 1 bedside commode;Tub/shower seat    Recommendations for Other Services      Precautions / Restrictions Precautions Precautions: Fall Restrictions Weight Bearing Restrictions: No       Mobility Bed Mobility Overal bed mobility: Needs Assistance Bed Mobility: Supine to Sit     Supine to sit: Min assist       Patient Response: Cooperative  Transfers Overall transfer level: Needs assistance Equipment used: Rolling walker (2 wheeled) Transfers: Sit to/from Stand Sit to Stand: Min assist              Balance Overall balance assessment: Needs assistance Sitting-balance support: Feet supported;Bilateral upper extremity supported Sitting balance-Leahy Scale: Good     Standing balance support: Bilateral upper extremity supported;During functional activity Standing balance-Leahy Scale: Poor Standing balance comment: reliant on external assist                           ADL either  performed or assessed with clinical judgement   ADL       Grooming: Oral care;Standing;Supervision/safety;Wash/dry face;Wash/dry hands           Upper Body Dressing : Minimal assistance;Standing   Lower Body Dressing: Set up;Sitting/lateral leans   Toilet Transfer: Supervision/safety;RW;Ambulation           Functional mobility during ADLs: Supervision/safety;Rolling walker General ADL Comments: increased complaints of back pain today     Vision Baseline Vision/History: Wears glasses Wears Glasses: At all times Patient Visual Report: No change from baseline Additional Comments: Glasses frame to R arm are broken   Perception     Praxis      Cognition Arousal/Alertness: Awake/alert Behavior During Therapy: WFL for tasks assessed/performed Overall Cognitive Status: Impaired/Different from baseline                     Current Attention Level: Sustained Memory: Decreased short-term memory Following Commands: Follows one step commands with increased time Safety/Judgement: Decreased awareness of safety;Decreased awareness of deficits Awareness: Emergent Problem Solving: Slow processing          Exercises     Shoulder Instructions       General Comments      Pertinent Vitals/ Pain       Pain Score: 4  Pain Location: back Pain Descriptors / Indicators: Grimacing;Aching;Shooting Pain Intervention(s): Monitored during session  Frequency  Min 2X/week        Progress Toward Goals  OT Goals(current goals can now be found in the care plan section)  Progress towards OT goals: Progressing toward goals  Acute Rehab OT Goals Patient Stated Goal: Return home OT Goal Formulation: With patient Time For Goal Achievement: 01/02/21 Potential to Achieve Goals: Good  Plan Discharge plan remains appropriate    Co-evaluation                 AM-PAC OT "6 Clicks"  Daily Activity     Outcome Measure   Help from another person eating meals?: None Help from another person taking care of personal grooming?: None Help from another person toileting, which includes using toliet, bedpan, or urinal?: None Help from another person bathing (including washing, rinsing, drying)?: A Little Help from another person to put on and taking off regular upper body clothing?: A Little Help from another person to put on and taking off regular lower body clothing?: A Little 6 Click Score: 21    End of Session Equipment Utilized During Treatment: Rolling walker  OT Visit Diagnosis: Unsteadiness on feet (R26.81);Pain;Other symptoms and signs involving cognitive function Pain - Right/Left: Left Pain - part of body: Leg   Activity Tolerance Patient tolerated treatment well   Patient Left in bed;with family/visitor present   Nurse Communication          Time: 5456-2563 OT Time Calculation (min): 19 min  Charges: OT General Charges $OT Visit: 1 Visit OT Treatments $Self Care/Home Management : 8-22 mins  12/21/2020  Rich, OTR/L  Acute Rehabilitation Services  Office:  [phone]    Metta Clines 12/21/2020, 11:08 AM

## 2020-12-22 DIAGNOSIS — I161 Hypertensive emergency: Secondary | ICD-10-CM

## 2020-12-22 DIAGNOSIS — G9341 Metabolic encephalopathy: Secondary | ICD-10-CM

## 2020-12-22 LAB — GLUCOSE, CAPILLARY
Glucose-Capillary: 130 mg/dL — ABNORMAL HIGH (ref 70–99)
Glucose-Capillary: 159 mg/dL — ABNORMAL HIGH (ref 70–99)
Glucose-Capillary: 164 mg/dL — ABNORMAL HIGH (ref 70–99)
Glucose-Capillary: 113 mg/dL — ABNORMAL HIGH (ref 70–99)

## 2020-12-22 LAB — CULTURE, BLOOD (ROUTINE X 2)
Culture: NO GROWTH
Special Requests: ADEQUATE

## 2020-12-22 MED ORDER — TOPIRAMATE 25 MG PO TABS
25.0000 mg | ORAL_TABLET | Freq: Every day | ORAL | Status: DC
  Administered 2020-12-22 – 2020-12-28 (×7): 25 mg via ORAL
  Filled 2020-12-22 (×7): qty 1

## 2020-12-22 MED ORDER — MELATONIN 3 MG PO TABS
3.0000 mg | ORAL_TABLET | Freq: Every day | ORAL | Status: DC
  Administered 2020-12-22 – 2020-12-31 (×10): 3 mg via ORAL
  Filled 2020-12-22 (×10): qty 1

## 2020-12-22 NOTE — Progress Notes (Signed)
Inpatient Rehabilitation Medication Review by a Pharmacist  A complete drug regimen review was completed for this patient to identify any potential clinically significant medication issues.  Clinically significant medication issues were identified:  no   Type of Medication Issue Identified Description of Issue Urgent (address now) Non-Urgent (address on AM team rounds) Plan Plan Accepted by Provider? (Yes / No / Pending AM Rounds)  Drug Interaction(s) (clinically significant)       Duplicate Therapy       Allergy       No Medication Administration End Date       Incorrect Dose       Additional Drug Therapy Needed       Other  PTA meds not resumed: vit C, Zyrtec prn, Zetia, fish oil, gabapentin prn, loperamide, Norco prn, Robaxin prn, psyllium prn, Crestor, Victoza, B12 (d/c'd inpatient for high level) Non-urgent Reach out to provider Mon Pending Mon rounds    Name of provider notified for urgent issues identified: n/a  Provider Method of Notification: n/a   For non-urgent medication issues to be resolved on team rounds Mon morning a [facility] Secure Chat Handoff was sent to: Alanda Slim   Pharmacist comments: nothing urgent needing attention over the weekend  Time spent performing this drug regimen review (minutes):  15 minutes   Thank you for involving pharmacy in this patient's care.  Renold Genta, PharmD, BCPS Clinical Pharmacist Clinical phone for 12/22/2020 until 3p is [phone] 12/22/2020 10:15 AM  **Pharmacist phone directory can be found on [email or web address] listed under [place]**

## 2020-12-22 NOTE — Evaluation (Signed)
Occupational Therapy Assessment and Plan  Patient Details  Name: Matthew Francis MRN: [id number] Date of Birth: [birth year]  OT Diagnosis: acute pain, cognitive deficits, and muscle weakness (generalized) Rehab Potential: Rehab Potential (ACUTE ONLY): Good ELOS: 7-10 days   Today's Date: 12/22/2020 OT Individual Time: 1030-1141 OT Individual Time Calculation (min): 71 min     Hospital Problem: Principal Problem:   ICH (intracerebral hemorrhage) ([place])   Past Medical History:  Past Medical History:  Diagnosis Date   Arthritis    GERD (gastroesophageal reflux disease)    H/O ulcerative colitis    Hyperlipidemia    Hypertension    Inguinal hernia    Ulcer    ulcerative colitis   Ulcerative colitis    Wears glasses    Past Surgical History:  Past Surgical History:  Procedure Laterality Date   APPENDECTOMY     COLECTOMY     Proctocolectomy with IPAA   COLECTOMY     FOREIGN BODY REMOVAL ABDOMINAL  02/17/2012   Procedure: REMOVAL FOREIGN BODY ABDOMINAL;  Surgeon: Odis Hollingshead, MD;  Location: [place];  Service: General;  Laterality: N/A;  abdominal wound exploration and removal of foreign body   HERNIA REPAIR     Multiple incisional hernias.   ileoanal anastomosis      Assessment & Plan Clinical Impression:   Pt is a 60 y.o. male who presented 6/28 with AMS, nausea, and headache. 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 PMH: arthritis, GERD, HTN, inguinal hernia, and ulcerative colitis   Patient transferred to [place] on 12/21/2020 .    Patient currently requires min with basic self-care skills secondary to muscle weakness, decreased coordination, and decreased safety awareness and decreased memory.  Prior to hospitalization, patient could complete ADLs, IADLs, and driving with independent .  Patient will benefit from skilled intervention to increase independence with basic self-care  skills and increase level of independence with iADL prior to discharge home independently.  Anticipate patient will require 24 hour supervision and follow up home health.  OT - End of Session Activity Tolerance: (P) Tolerates 30+ min activity with multiple rests Endurance Deficit: (P) Yes Endurance Deficit Description: (P) multipple rest breaks required during eval OT Assessment Rehab Potential (ACUTE ONLY): (P) Good OT Barriers to Discharge: (P) Decreased caregiver support OT Patient demonstrates impairments in the following area(s): (P) Balance;Behavior;Cognition;Endurance;Motor;Pain;Perception;Safety OT Basic ADL's Functional Problem(s): (P) Grooming;Bathing;Dressing;Toileting OT Advanced ADL's Functional Problem(s): (P) Laundry;Simple Meal Preparation;Light Housekeeping OT Transfers Functional Problem(s): (P) Toilet;Tub/Shower OT Additional Impairment(s): (P) None OT Plan OT Intensity: (P) Minimum of 1-2 x/day, 45 to 90 minutes OT Frequency: (P) 5 out of 7 days OT Duration/Estimated Length of Stay: (P) 7-10 days OT Treatment/Interventions: (P) Balance/vestibular training;Cognitive remediation/compensation;Neuromuscular re-education;Self Care/advanced ADL retraining;Therapeutic Exercise;UE/LE Strength taining/ROM;Pain management;Skin care/wound managment;DME/adaptive equipment instruction;Discharge planning;Functional mobility training;Therapeutic Activities;UE/LE Coordination activities;Patient/family education;Community reintegration OT Self Feeding Anticipated Outcome(s): independent OT Basic Self-Care Anticipated Outcome(s): mod I OT Toileting Anticipated Outcome(s): mod I OT Bathroom Transfers Anticipated Outcome(s): supervision OT Recommendation Patient destination: Home Follow Up Recommendations: Home health OT Equipment Recommended: Tub/shower bench;To be determined   OT Evaluation Precautions/Restrictions  Precautions Precautions: Fall Restrictions Weight Bearing  Restrictions: No General Chart Reviewed: Yes Family/Caregiver Present: No Vital Signs Therapy Vitals Pulse Rate: (!) 108 BP: 118/76 Pain Pain Assessment Pain Scale: 0-10 Pain Score: 7  Pain Type: Acute pain Pain Location: Abdomen Pain Descriptors / Indicators: Sore;Cramping Pain Onset: Gradual Pain Intervention(s):  Medication (See eMAR) Home Living/Prior [place] expects to be discharged to:: Private residence Living Arrangements: Alone Available Help at Discharge: Available PRN/intermittently Type of Home: House Home Access: Stairs to enter Technical brewer of Steps: 1 Entrance Stairs-Rails: None Home Layout: One level Bathroom Shower/Tub: Optometrist: Yes  Lives With: Alone IADL History Homemaking Responsibilities: Yes Meal Prep Responsibility: Primary Laundry Responsibility: Primary Cleaning Responsibility: Primary Education: Investment banker, operational Type of Occupation: Pharmacist, hospital Prior Function Level of Independence: Independent with basic ADLs, Independent with homemaking with ambulation, Independent with transfers, Independent with gait  Able to Take Stairs?: Yes Driving: Yes Vocation: Full time employment Vision Baseline Vision/History: Wears glasses Wears Glasses: At all times Patient Visual Report: No change from baseline Vision Assessment?: No apparent visual deficits Perception  Perception: Within Functional Limits Praxis Praxis: Intact Cognition Overall Cognitive Status: Impaired/Different from baseline Arousal/Alertness: Awake/alert Orientation Level: Person;Place Year: 2022 Month: July Day of Week: Incorrect Memory: Impaired Memory Impairment: Storage deficit;Retrieval deficit;Decreased recall of new information Immediate Memory Recall: Sock;Blue;Bed Memory Recall Sock: Without Cue Memory Recall Blue: Without Cue Memory Recall Bed: Not able  to recall Attention: Sustained;Focused Sustained Attention: Impaired Sustained Attention Impairment: Verbal complex;Verbal basic;Functional basic Awareness: Impaired Awareness Impairment: Emergent impairment Problem Solving: Impaired Problem Solving Impairment: Functional complex;Verbal complex Executive Function: Self Monitoring Self Monitoring: Impaired Self Monitoring Impairment: Verbal complex;Verbal basic Behaviors: Impulsive Safety/Judgment: Impaired Sensation Sensation Light Touch: Appears Intact Hot/Cold: Appears Intact Proprioception: Appears Intact Additional Comments: pt c/o small amount of decreased sensation in R hands and fingertips Coordination Gross Motor Movements are Fluid and Coordinated: No Fine Motor Movements are Fluid and Coordinated: No Finger Nose Finger Test: [facility] Motor  Motor Motor: Within Functional Limits  Trunk/Postural Assessment  Cervical Assessment Cervical Assessment: Within Functional Limits Thoracic Assessment Thoracic Assessment: Within Functional Limits Lumbar Assessment Lumbar Assessment: Within Functional Limits Postural Control Postural Control: Within Functional Limits  Balance Balance Balance Assessed: Yes Static Sitting Balance Static Sitting - Balance Support: No upper extremity supported Static Sitting - Level of Assistance: 5: Stand by assistance Static Standing Balance Static Standing - Balance Support: No upper extremity supported Static Standing - Level of Assistance: 5: Stand by assistance Extremity/Trunk Assessment RUE Assessment RUE Assessment: Within Functional Limits LUE Assessment LUE Assessment: Within Functional Limits  Care Tool Care Tool Self Care Eating   Eating Assist Level: Set up assist    Oral Care    Oral Care Assist Level: Supervision/Verbal cueing    Bathing   Body parts bathed by patient: Right arm;Left arm;Chest;Abdomen;Front perineal area;Buttocks;Right upper leg;Left upper leg;Right lower  leg;Face Body parts bathed by helper: Left lower leg   Assist Level: Minimal Assistance - Patient > 75%    Upper Body Dressing(including orthotics)   What is the patient wearing?: Pull over shirt   Assist Level: Set up assist    Lower Body Dressing (excluding footwear)   What is the patient wearing?: Underwear/pull up Assist for lower body dressing: Minimal Assistance - Patient > 75%    Putting on/Taking off footwear   What is the patient wearing?: Non-skid slipper socks Assist for footwear: Minimal Assistance - Patient > 75%       Care Tool Toileting Toileting activity   Assist for toileting: Contact Guard/Touching assist     Care Tool Bed Mobility Roll left and right activity   Roll left and right assist level: Supervision/Verbal cueing    Sit to lying activity  Lying to sitting edge of bed activity   Lying to sitting edge of bed assist level: Minimal Assistance - Patient > 75%     Care Tool Transfers Sit to stand transfer   Sit to stand assist level: Contact Guard/Touching assist    Chair/bed transfer   Chair/bed transfer assist level: Contact Guard/Touching assist     Toilet transfer   Assist Level: Contact Guard/Touching assist     Care Tool Cognition Expression of Ideas and Wants Expression of Ideas and Wants: Without difficulty (complex and basic) - expresses complex messages without difficulty and with speech that is clear and easy to understand   Understanding Verbal and Non-Verbal Content Understanding Verbal and Non-Verbal Content: Understands (complex and basic) - clear comprehension without cues or repetitions   Memory/Recall Ability *first 3 days only Memory/Recall Ability *first 3 days only: Current season;That he or she is in a hospital/hospital unit    Refer to Care Plan for [place] 1 OT Short Term Goal 1 (Week 1): STGs=LTGs 2/2 ELOS  Recommendations for other services: None    Skilled Therapeutic  Intervention  Pt received in room and consented to OT eval and tx. Session initiated with OT eval and progressed to self care retraining. Pt instructed in morning ADL routine including toileting, bathing, dressing, and functional transfers and mobility. Pt is very meticulous during bathing tasks and requires extensive time for bathing while seated in shower. During ADLs, pt only req min A for LB ADLs threading pants/washing L lower leg 2/2 pt c/o tightness. Pt walked with RW into and out of the bathroom and completed all functional transfers with CGA and cuing for safety. Pt says some interesting things during eval, such as, "if I could invent adhesive without the sticky part, I'd make millions. I've been thinking about it for about 8 years now."  Pt called his aunt during session to ask to bring more clean clothes for his stay here and was only able to call her using "hey google, call Justice Rocher" as he was having difficulty using his phone and typing in the number. After tx, pt left sitting up in recliner with chair alarm on and all needs met.   ADL ADL Eating: Set up Where Assessed-Eating: Chair Grooming: Setup Where Assessed-Grooming: Edge of bed Upper Body Bathing: Supervision/safety Where Assessed-Upper Body Bathing: Shower Lower Body Bathing: Supervision/safety Where Assessed-Lower Body Bathing: Shower Upper Body Dressing: Setup Where Assessed-Upper Body Dressing: Edge of bed Lower Body Dressing: Minimal assistance Where Assessed-Lower Body Dressing: Edge of bed Toileting: Contact guard Where Assessed-Toileting: Glass blower/designer: Therapist, music Method: Ambulating (with RW) Science writer: Energy manager: Curator Method: Ambulating (with RW) Youth worker: Grab bars Mobility  Bed Mobility Bed Mobility: Sitting - Scoot to [employer] of Bed;Supine to Sit Supine to Sit: Minimal Assistance - Patient >  75% Sitting - Scoot to [employer] of Bed: Contact Guard/Touching assist Transfers Sit to Stand: Contact Guard/Touching assist   Discharge Criteria: Patient will be discharged from OT if patient refuses treatment 3 consecutive times without medical reason, if treatment goals not met, if there is a change in medical status, if patient makes no progress towards goals or if patient is discharged from hospital.  The above assessment, treatment plan, treatment alternatives and goals were discussed and mutually agreed upon: by patient  [employer] 12/22/2020, 11:22 AM

## 2020-12-22 NOTE — Plan of Care (Signed)
  Problem: RH Balance Goal: LTG Patient will maintain dynamic standing balance (PT) Description: LTG:  Patient will maintain dynamic standing balance with assistance during mobility activities (PT) Flowsheets (Taken 12/22/2020 1845) LTG: Pt will maintain dynamic standing balance during mobility activities with:: Independent with assistive device    Problem: Sit to Stand Goal: LTG:  Patient will perform sit to stand with assistance level (PT) Description: LTG:  Patient will perform sit to stand with assistance level (PT) Flowsheets (Taken 12/22/2020 1845) LTG: PT will perform sit to stand in preparation for functional mobility with assistance level: Independent with assistive device   Problem: RH Bed Mobility Goal: LTG Patient will perform bed mobility with assist (PT) Description: LTG: Patient will perform bed mobility with assistance, with/without cues (PT). Flowsheets (Taken 12/22/2020 1845) LTG: Pt will perform bed mobility with assistance level of: Independent with assistive device    Problem: RH Bed to Chair Transfers Goal: LTG Patient will perform bed/chair transfers w/assist (PT) Description: LTG: Patient will perform bed to chair transfers with assistance (PT). Flowsheets (Taken 12/22/2020 1845) LTG: Pt will perform Bed to Chair Transfers with assistance level: Supervision/Verbal cueing   Problem: RH Car Transfers Goal: LTG Patient will perform car transfers with assist (PT) Description: LTG: Patient will perform car transfers with assistance (PT). Flowsheets (Taken 12/22/2020 1845) LTG: Pt will perform car transfers with assist:: Supervision/Verbal cueing   Problem: RH Furniture Transfers Goal: LTG Patient will perform furniture transfers w/assist (OT/PT) Description: LTG: Patient will perform furniture transfers  with assistance (OT/PT). Flowsheets (Taken 12/22/2020 1845) LTG: Pt will perform furniture transfers with assist:: Supervision/Verbal cueing   Problem: RH Ambulation Goal:  LTG Patient will ambulate in home environment (PT) Description: LTG: Patient will ambulate in home environment, # of feet with assistance (PT). Flowsheets (Taken 12/22/2020 1845) LTG: Pt will ambulate in home environ  assist needed:: Supervision/Verbal cueing LTG: Ambulation distance in home environment: at least 50 feet using LRAD Goal: LTG Patient will ambulate in community environment (PT) Description: LTG: Patient will ambulate in community environment, # of feet with assistance (PT). Flowsheets (Taken 12/22/2020 1845) LTG: Pt will ambulate in community environ  assist needed:: Supervision/Verbal cueing LTG: Ambulation distance in community environment: up to 300 feet using LRAD   Problem: RH Stairs Goal: LTG Patient will ambulate up and down stairs w/assist (PT) Description: LTG: Patient will ambulate up and down # of stairs with assistance (PT) Flowsheets (Taken 12/22/2020 1845) LTG: Pt will ambulate up/down stairs assist needed:: Supervision/Verbal cueing LTG: Pt will  ambulate up and down number of stairs: at least 4 steps using one HR

## 2020-12-22 NOTE — Evaluation (Signed)
Physical Therapy Assessment and Plan  Patient Details  Name: Matthew Francis MRN: [id number] Date of Birth: 08-03-1960  PT Diagnosis: Coordination disorder, Difficulty walking, Hemiparesis non-dominant, Impaired cognition, Muscle weakness, Pain in joint, and Pain in head Rehab Potential: Good ELOS: 7-10 days   Today's Date: 12/22/2020 PT Individual Time: 1436-1600 PT Individual Time Calculation (min): 72 min    Hospital Problem: Principal Problem:   ICH (intracerebral hemorrhage) ([place])   Past Medical History:  Past Medical History:  Diagnosis Date   Arthritis    GERD (gastroesophageal reflux disease)    H/O ulcerative colitis    Hyperlipidemia    Hypertension    Inguinal hernia    Ulcer    ulcerative colitis   Ulcerative colitis    Wears glasses    Past Surgical History:  Past Surgical History:  Procedure Laterality Date   APPENDECTOMY     COLECTOMY     Proctocolectomy with IPAA   COLECTOMY     FOREIGN BODY REMOVAL ABDOMINAL  02/17/2012   Procedure: REMOVAL FOREIGN BODY ABDOMINAL;  Surgeon: Odis Hollingshead, MD;  Location: [facility];  Service: General;  Laterality: N/A;  abdominal wound exploration and removal of foreign body   HERNIA REPAIR     Multiple incisional hernias.   ileoanal anastomosis      Assessment & Plan Clinical Impression: Patient is a 60 y.o. right-handed male history of hypertension, obesity with BMI 27.43, ulcerative colitis as well as hyperlipidemia.  Per chart review patient lives alone.  Independent prior to admission works at [employer].  1 level home one-step to entry.  He does have family in the [place] area.  Presented 58/28/2022 with headache as well as nausea and vomiting.  Admission chemistries unremarkable aside glucose 125, creatinine 1.31, hemoglobin A1c 5.9, urine drug screen positive cocaine, ammonia level 39.  Cranial CT scan showed acute hemorrhage within the right caudate basal ganglia with hemorrhage  in the right lateral third and fourth ventricles.  No midline shift.  MRA was unremarkable.  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  Echocardiogram with ejection fraction of 60 to 07% grade 1 diastolic dysfunction.  EEG negative for seizure.  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  Dr. Kathyrn Sheriff follow-up for right caudate hemorrhage as well as intraventricular extension question hydrocephalus no current plan for EVD.  He is tolerating a regular consistency diet.  Blood culture 12/16/2020 gram-negative rods found in the 1 culture.  He was placed on Rocephin empirically and awaiting plan for duration of antibiotic.  Patient with intermittent bouts of restlessness and agitation placed on Risperdal.  Therapy evaluations completed due to patient's decreased functional ability altered mental status was admitted for a comprehensive rehab program. Patient transferred to CIR on 12/21/2020 .   Patient currently requires  CGA  with mobility secondary to muscle weakness, decreased cardiorespiratoy endurance, unbalanced muscle activation, ,, decreased safety awareness and decreased memory, and decreased standing balance, hemiplegia, and decreased balance strategies.  Prior to hospitalization, patient was independent  with mobility and lived with Alone in a House home.  Home access is 1Stairs to enter.  Patient will benefit from skilled PT intervention to maximize safe functional mobility, minimize fall risk, and decrease caregiver burden for planned discharge home with 24 hour supervision.  Anticipate patient will benefit  from follow up OP at discharge.  PT - End of Session Activity Tolerance: Tolerates 30+ min activity with multiple  rests Endurance Deficit: Yes PT Assessment Rehab Potential (ACUTE/IP ONLY): Good PT Barriers to Discharge: Decreased caregiver support;Home environment access/layout;Lack of/limited family support;Insurance for SNF coverage;Medication compliance;Behavior;Nutrition means PT Patient demonstrates impairments in the following area(s): Balance;Behavior;Endurance;Motor;Pain;Safety PT Transfers Functional Problem(s): Bed Mobility;Bed to Chair;Car;Furniture PT Locomotion Functional Problem(s): Ambulation;Stairs PT Plan PT Intensity: Minimum of 1-2 x/day ,45 to 90 minutes PT Frequency: 5 out of 7 days PT Duration Estimated Length of Stay: 7-10 days PT Treatment/Interventions: Ambulation/gait training;Balance/vestibular training;Discharge planning;Disease management/prevention;Functional mobility training;Neuromuscular re-education;Patient/family education;Stair training;Therapeutic Activities;Therapeutic Exercise;UE/LE Strength taining/ROM;UE/LE Coordination activities;Wheelchair propulsion/positioning;DME/adaptive equipment instruction PT Transfers Anticipated Outcome(s): Mod I/ SUP PT Locomotion Anticipated Outcome(s): SUP PT Recommendation Follow Up Recommendations: 24 hour supervision/assistance Patient destination: Home Equipment Recommended: To be determined   PT Evaluation Precautions/Restrictions Precautions Precautions: Fall Precaution Comments: mild L hemi Restrictions Weight Bearing Restrictions: No General   Vital SignsTherapy Vitals Temp: (!) 97 F (36.1 C) Pulse Rate: 71 Resp: 18 BP: 133/89 Patient Position (if appropriate): Sitting Oxygen Therapy SpO2: 100 % O2 Device: Room Air Pain Pain Assessment Pain Scale: 0-10 Pain Score: 8  Pain Type: Acute pain Pain Location: Head Pain Orientation: Mid;Anterior Pain Descriptors / Indicators: Aching;Throbbing;Headache Pain Onset: On-going Patients Stated Pain Goal: 0 Pain Intervention(s): Medication (See eMAR);MD notified  (Comment);RN made aware;Repositioned (RN request possible topamax for pt's headache pain) Home Living/Prior Functioning Home Living Available Help at Discharge: Available PRN/intermittently;Friend(s);Family Type of Home: House Home Access: Stairs to enter [employer] of Steps: 1 Entrance Stairs-Rails: None Home Layout: One level Bathroom Shower/Tub: Chiropodist: Standard Bathroom Accessibility: Yes  Lives With: Alone Prior Function Level of Independence: Independent with basic ADLs;Independent with homemaking with ambulation;Independent with transfers;Independent with gait  Able to Take Stairs?: Yes Driving: Yes Vocation: Full time employment Vision/Perception  Vision - Assessment Additional Comments: R arm of glasses broken. Perception Perception: Within Functional Limits Praxis Praxis: Intact  Cognition Overall Cognitive Status: Impaired/Different from baseline Arousal/Alertness: Awake/alert Orientation Level: Oriented to person;Oriented to situation;Disoriented to place;Oriented to time Memory: Impaired Awareness: Impaired Self Monitoring: Impaired Behaviors: Restless;Impulsive;Verbal agitation Safety/Judgment: Impaired Sensation Sensation Light Touch: Appears Intact Coordination Gross Motor Movements are Fluid and Coordinated: No Fine Motor Movements are Fluid and Coordinated: No Coordination and Movement Description: slight coordination impairment noted in LLE during functional stand pivot transfer Heel Shin Test: unable to perform with LLE d/t weakness and pain at L hip with attempt Motor  Motor Motor: Hemiplegia Motor - Skilled Clinical Observations: LLE weakness noted in hip musculature affecting balance and functional mobility   Trunk/Postural Assessment  Cervical Assessment Cervical Assessment: Within Functional Limits Thoracic Assessment Thoracic Assessment: Within Functional Limits Lumbar Assessment Lumbar Assessment:  Within Functional Limits Postural Control Postural Control: Within Functional Limits  Balance Balance Balance Assessed: Yes Static Sitting Balance Static Sitting - Balance Support: No upper extremity supported;Feet supported Static Sitting - Level of Assistance: 5: Stand by assistance Dynamic Sitting Balance Dynamic Sitting - Balance Support: Feet supported;No upper extremity supported Dynamic Sitting - Level of Assistance: 5: Stand by assistance Sitting balance - Comments: supervision needed for safety Static Standing Balance Static Standing - Balance Support: No upper extremity supported Static Standing - Level of Assistance: 5: Stand by assistance Dynamic Standing Balance Dynamic Standing - Balance Support: No upper extremity supported;During functional activity Dynamic Standing - Level of Assistance: 5: Stand by assistance;4: Min assist Dynamic Standing - Balance Activities:  Lateral lean/weight shifting;Forward lean/weight shifting;Reaching for objects;Reaching for weighted objects;Reaching across midline;Compliant surfaces Extremity Assessment      RLE Assessment RLE Assessment: Exceptions to [facility] Active Range of Motion (AROM) Comments: WFL RLE Strength RLE Overall Strength: Within Functional Limits for tasks assessed Right Hip Flexion: 4-/5 Right Hip Extension: 4/5 Right Hip ABduction: 4+/5 Right Hip ADduction: 4+/5 Right Knee Flexion: 4+/5 Right Knee Extension: 4/5 Right Ankle Dorsiflexion: 4+/5 Right Ankle Plantar Flexion: 4+/5 LLE Assessment LLE Assessment: Exceptions to [facility] Active Range of Motion (AROM) Comments: L hip flexion active range limited by pain LLE Strength LLE Overall Strength: Deficits;Due to pain Left Hip Flexion: 3-/5 Left Hip Extension: 3+/5 Left Hip ABduction: 4/5 Left Hip ADduction: 4/5 Left Knee Flexion: 4-/5 Left Knee Extension: 3+/5 Left Ankle Dorsiflexion: 4+/5 Left Ankle Plantar Flexion: 4+/5  Care Tool Care Tool Bed Mobility Roll  left and right activity   Roll left and right assist level: Supervision/Verbal cueing    Sit to lying activity   Sit to lying assist level: Contact Guard/Touching assist    Lying to sitting edge of bed activity   Lying to sitting edge of bed assist level: Minimal Assistance - Patient > 75%     Care Tool Transfers Sit to stand transfer   Sit to stand assist level: Contact Guard/Touching assist    Chair/bed transfer   Chair/bed transfer assist level: Contact Guard/Touching assist     Toilet transfer   Assist Level: Contact Guard/Touching assist    Car transfer   Car transfer assist level: Minimal Assistance - Patient > 75%      Care Tool Locomotion Ambulation   Assist level: Contact Guard/Touching assist Assistive device: No Device Max distance: 150 ft  Walk 10 feet activity   Assist level: Contact Guard/Touching assist Assistive device: No Device   Walk 50 feet with 2 turns activity   Assist level: Contact Guard/Touching assist Assistive device: No Device  Walk 150 feet activity   Assist level: Contact Guard/Touching assist Assistive device: No Device  Walk 10 feet on uneven surfaces activity Walk 10 feet on uneven surfaces activity did not occur: Safety/medical concerns      Stairs   Assist level: Contact Guard/Touching assist Stairs assistive device: 2 hand rails Max number of stairs: 4  Walk up/down 1 step activity   Walk up/down 1 step (curb) assist level: Contact Guard/Touching assist Walk up/down 1 step or curb assistive device: 2 hand rails    Walk up/down 4 steps activity Walk up/down 4 steps assist level: Contact Guard/Touching assist Walk up/down 4 steps assistive device: 2 hand rails  Walk up/down 12 steps activity Walk up/down 12 steps activity did not occur: Safety/medical concerns      Pick up small objects from floor   Pick up small object from the floor assist level: Minimal Assistance - Patient > 75% Pick up small object from the floor  assistive device: Pt reached out for closest item of furniture available for HHA - the sink.  Wheelchair Will patient use wheelchair at discharge?: No   Wheelchair activity did not occur: N/A      Wheel 50 feet with 2 turns activity Wheelchair 50 feet with 2 turns activity did not occur: N/A    Wheel 150 feet activity Wheelchair 150 feet activity did not occur: N/A      Refer to Care Plan for Long Term Goals  SHORT TERM GOAL WEEK 1 PT Short Term Goal 1 (Week 1): STG = LTG d/t ELOS  Recommendations for other services: None   Skilled Therapeutic Intervention PT Evaluation completed; see above/ below for results. PT educated patient in roles of PT vs OT, PT POC, rehab potential, rehab goals, and discharge recommendations along with recommendation for follow-up rehabilitation services. Individual treatment initiated:  Patient seated in recliner upon PT arrival. Patient alert and agreeable to PT session, however stating urgent need to toilet. Pt complains of 8/10 headache pain throughout session. Disc with RN re: need for adjustment to headache medication with RN stating completed referral to MD for topamax trial for pt's persistent headache pain as Tylenol is not affecting pain levels.  Following toileting, pt with numerous complaints re: safety measures, inability to rearrange room furniture, personal lack of clothing, room temperature, medications, and current state of bowels. Relates impatience from staff at questions re: above and requests for items such as sweetener. Pt's complaints and disappointment addressed with focused listening, compassion and patience along with understanding that pt's needs are realistic and can easily be met. Some of pt's clothing located and as pt is cold, provided to pt in order to dress for warmth as pt declines paper scrubs. Recommended to pt to ask family re: other missing items of clothing and to bring warm clothing and tennis shoes for pt.   Therapeutic  Activity: Bed Mobility: Patient performed supine to/from sit with supervision/ CGA. Provided verbal cues for technique. Transfers: Patient performed sit to/from stand with CGA and no AD throughout session.  Provided verbal cues for safety awareness and technique in reaching back to armrests prior to descent to sit.  Gait Training:  Patient ambulated short distances in room as well as 150' x1 including two 90deg turns with no AD and CGA. Ambulated with step through gait pattern, slight decreased stance time on L, and intermittent scuffing of L foot most likely d/t wearing of heavy workboots. Provided verbal cues for increased step height on L, level gaze. For shorter distances in room, pt looking for items of clothing as well as other items and demos intermittent furniture walking to reach target location.   Stair negotiation performed with pt able to ascend/ descend with reciprocal step pattern using BHR. Strong use of LLE noted. VC throughout descent for safe clearance of descending foot/ heel.   Neuromuscular Re-ed: NMR facilitated during session with focus on standing balance. Pt guided in static and dynamic reaching during functional task of searching for personal items in room. Good balance noted but one instance of pt closing eyes briefly with slight increase in sway while standing. Pt later relates that he was "just joking".  NMR performed for improvements in motor control and coordination, balance, sequencing, judgement, and self confidence/ efficacy in performing all aspects of mobility at highest level of independence.   Patient seated in recliner with BLE elevated at end of session with brakes locked, seat alarm set, and all needs within reach. Pt provided with water and diet drink per request for continued therapeutic alliance and anxiety reduction.   Mobility Bed Mobility Bed Mobility: Sitting - Scoot to Edge of Bed;Supine to Sit Supine to Sit: Minimal Assistance - Patient >  75% Sitting - Scoot to [employer] of Bed: Contact Guard/Touching assist Transfers Transfers: Sit to Stand;Stand Pivot Transfers;Stand to Sit Sit to Stand: Contact Guard/Touching assist Stand to Sit: Contact Guard/Touching assist Stand Pivot Transfers: Contact Guard/Touching assist Transfer (Assistive device): None Locomotion  Gait Ambulation: Yes Gait Distance (Feet): 150 Feet Assistive device: None Gait Gait: Yes Gait Pattern: Within Functional Limits  Gait Pattern: Step-through pattern;Decreased stance time - left;Decreased weight shift to left Gait velocity: slightly decreased - most likely d/t headache pain Stairs / Additional Locomotion Stairs: Yes Stairs Assistance: Contact Guard/Touching assist Stair Management Technique: Two rails Number of Stairs: 4 Height of Stairs: 6 Ramp: Contact Guard/touching assist   Discharge Criteria: Patient will be discharged from PT if patient refuses treatment 3 consecutive times without medical reason, if treatment goals not met, if there is a change in medical status, if patient makes no progress towards goals or if patient is discharged from hospital.  The above assessment, treatment plan, treatment alternatives and goals were discussed and mutually agreed upon: by patient  Alger Simons PT, DPT 12/22/2020, 5:43 PM

## 2020-12-22 NOTE — Progress Notes (Signed)
Pt. Was lethargic/fatigue and could not administer HS medication at scheduled time. Pt. Opened eyes and fall back to sleep. Notified Dr. Ranell Patrick and charge nurse. Will continue to monitor for clinical changes.

## 2020-12-22 NOTE — Evaluation (Signed)
Speech Language Pathology Assessment and Plan  Patient Details  Name: Matthew Francis MRN: [id number] Date of Birth: February 11, 1961  SLP Diagnosis: Cognitive Impairments  Rehab Potential: Good ELOS: 10 days   Today's Date: 12/22/2020 SLP Individual Time: 6226-3335 SLP Individual Time Calculation (min): 70 min  Hospital Problem: Principal Problem:   ICH (intracerebral hemorrhage) ([place])  Past Medical History:  Past Medical History:  Diagnosis Date   Arthritis    GERD (gastroesophageal reflux disease)    H/O ulcerative colitis    Hyperlipidemia    Hypertension    Inguinal hernia    Ulcer    ulcerative colitis   Ulcerative colitis    Wears glasses    Past Surgical History:  Past Surgical History:  Procedure Laterality Date   APPENDECTOMY     COLECTOMY     Proctocolectomy with IPAA   COLECTOMY     FOREIGN BODY REMOVAL ABDOMINAL  02/17/2012   Procedure: REMOVAL FOREIGN BODY ABDOMINAL;  Surgeon: Odis Hollingshead, MD;  Location: [place];  Service: General;  Laterality: N/A;  abdominal wound exploration and removal of foreign body   HERNIA REPAIR     Multiple incisional hernias.   ileoanal anastomosis      Assessment / Plan / Recommendation Clinical Impression  Matthew Francis is a 60 year old right-handed male history of hypertension, obesity with BMI 27.43, ulcerative colitis as well as hyperlipidemia.  Per chart review patient lives alone.  Independent prior to admission works at [employer].  1 level home one-step to entry.  He does have family in the [place] area.  Presented 12/11/2020 with headache as well as nausea and vomiting.  Admission chemistries unremarkable aside glucose 125, creatinine 1.31, hemoglobin A1c 5.9, urine drug screen positive cocaine, ammonia level 39.  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  No midline shift.  MRA was  unremarkable.  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  Echocardiogram with ejection fraction of 60 to 45% grade 1 diastolic dysfunction.  EEG negative for seizure.  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  Dr. Kathyrn Sheriff follow-up for right caudate hemorrhage as well as intraventricular extension question hydrocephalus no current plan for EVD.  He is tolerating a regular consistency diet.  Blood culture 12/16/2020 gram-negative rods found in the 1 culture.  He was placed on Rocephin empirically and awaiting plan for duration of antibiotic.  Patient with intermittent bouts of restlessness and agitation placed on Risperdal.  Therapy evaluations completed due to patient's decreased functional ability altered mental status was admitted for a comprehensive rehab program.  Pt presents with mild cognitive impairment primarily in areas of recall, awareness, insight, attention and problem solving. Pt with a SLUMS score of 16/30, impacted by impulsivity of responses. When patient cued to pause before answering, accuracy greatly improved. Pt oriented to situation and self, however with inconsistent disorientation to time (thinking it is November and that he is in [place]). Pt demonstrates inconsistent insight into deficits and awareness of impulsivity, states at baseline he is "impulsive but accurate". Pt verbose and responds positively to verbal cues to increase topic maintenance and sustained attention. Pt lives alone, still works and is responsible for high level cognitive tasks and self care. Expressive and  receptive language judged to be [facility]. Pt will benefit from skilled ST during CIR to increase safety and independence with daily living task  for discharge to home environment.    Skilled Therapeutic Interventions          Pt participating in [place] (SLUMS) as well as further nonstandardized assessment of cognitive linguistic function. Please see above.   SLP Assessment  Patient will need skilled [place] [facility] during CIR admission    Recommendations  Recommendations for Other Services: Neuropsych consult Patient destination: Home Follow up Recommendations: None Equipment Recommended: None recommended by SLP    SLP Frequency 3 to 5 out of 7 days   SLP Duration  SLP Intensity  SLP Treatment/Interventions 10 days  Minumum of 1-2 x/day, 30 to 90 minutes  Cognitive remediation/compensation;Internal/external aids;Therapeutic Activities;Therapeutic Exercise;Patient/family education;Functional tasks;Medication managment    Pain Pain Assessment Pain Scale: 0-10 Pain Score: 7  Pain Type: Acute pain Pain Location: Abdomen Pain Descriptors / Indicators: Sore;Cramping Pain Onset: Gradual Pain Intervention(s): Medication (See eMAR)  Prior Functioning Cognitive/Linguistic Baseline: Within functional limits Type of Home: House  Lives With: Alone Available Help at Discharge: Available PRN/intermittently Education: Investment banker, operational Vocation: Full time employment  SLP Evaluation Cognition Overall Cognitive Status: Impaired/Different from baseline Arousal/Alertness: Awake/alert Orientation Level: Oriented to person;Oriented to situation;Disoriented to place;Disoriented to time Attention: Sustained;Focused Sustained Attention: Impaired Sustained Attention Impairment: Verbal complex;Verbal basic;Functional basic Memory: Impaired Memory Impairment: Storage deficit;Retrieval deficit;Decreased recall of new information Immediate Memory Recall: Sock;Blue;Bed Memory Recall Sock: Without Cue Memory Recall Blue: Without Cue Memory Recall Bed: Not able to recall Awareness:  Impaired Awareness Impairment: Emergent impairment Problem Solving: Impaired Problem Solving Impairment: Functional complex;Verbal complex Executive Function: Self Monitoring Self Monitoring: Impaired Self Monitoring Impairment: Verbal complex;Verbal basic Behaviors: Impulsive Safety/Judgment: Impaired  Comprehension Auditory Comprehension Overall Auditory Comprehension: Appears within functional limits for tasks assessed Expression Expression Primary Mode of Expression: Verbal Verbal Expression Overall Verbal Expression: Appears within functional limits for tasks assessed Written Expression Dominant Hand: Left Oral Motor Oral Motor/Sensory Function Overall Oral Motor/Sensory Function: Within functional limits Motor Speech Overall Motor Speech: Appears within functional limits for tasks assessed  Care Tool Care Tool Cognition Expression of Ideas and Wants Expression of Ideas and Wants: Without difficulty (complex and basic) - expresses complex messages without difficulty and with speech that is clear and easy to understand   Understanding Verbal and Non-Verbal Content Understanding Verbal and Non-Verbal Content: Understands (complex and basic) - clear comprehension without cues or repetitions   Memory/Recall Ability *first 3 days only Memory/Recall Ability *first 3 days only: Current season;That he or she is in a hospital/hospital unit    Short Term Goals: Week 1: SLP Short Term Goal 1 (Week 1): Pt will recall novel information with 90% accuracy min cues for compensatory strategies SLP Short Term Goal 2 (Week 1): Pt will increase orientation to time and location with 100% accuracy independent for use of visual aids SLP Short Term Goal 3 (Week 1): Pt will increase awareness of errors during structured task with Supervision A cues SLP Short Term Goal 4 (Week 1): Pt will complete mildly complex problem solving tasks with min A SLP Short Term Goal 5 (Week 1): Pt will utilize  strateiges as trained to reduce impulsivity during functional tasks with min-mod A SLP Short Term Goal 6 (Week 1): Pt will participate in alternating attention tasks with min-mod A  Refer to Care Plan for Long Term Goals  Recommendations for other services:  Neuropsych  Discharge Criteria: Patient will be discharged from SLP if patient refuses treatment 3 consecutive times without medical reason, if treatment goals not met, if there is a change in medical status, if patient makes no progress towards goals or if patient is discharged from hospital.  The above assessment, treatment plan, treatment alternatives and goals were discussed and mutually agreed upon: by patient  Dewaine Conger 12/22/2020, 11:46 AM

## 2020-12-22 NOTE — Progress Notes (Signed)
PROGRESS NOTE   Subjective/Complaints: Asks whether he can have grounds pass Not sleeping well Feeling hungry- does not feel he is getting enough food  ROS: +insomnia   Objective:   No results found. No results for input(s): WBC, HGB, HCT, PLT in the last 72 hours. No results for input(s): NA, K, CL, CO2, GLUCOSE, BUN, CREATININE, CALCIUM in the last 72 hours.  Intake/Output Summary (Last 24 hours) at 12/22/2020 1347 Last data filed at 12/22/2020 1333 Gross per 24 hour  Intake 222 ml  Output 800 ml  Net -578 ml        Physical Exam: Vital Signs Blood pressure 118/76, pulse (!) 108, temperature 98.5 F (36.9 C), resp. rate 16, height 5' 11"  (1.803 m), weight 90 kg, SpO2 96 %. Gen: no distress, normal appearing HEENT: oral mucosa pink and moist, NCAT Cardio: Tachycardia Chest: normal effort, normal rate of breathing Abd: soft, non-distended Ext: no edema Psych: pleasant, normal affect Skin: intact Neurological:    Mental Status: He is alert.    Comments: Patient is alert sitting at bedside.  He is a bit restless.  Makes eye contact with examiner.  Provides name and age.  Oriented to month, day of week, year, day with cues. STM deficits. Impaired insight and awareness. Could provide some biographical information. Moves all 4 limbs. Mild left PD and decreased FMC/apraxia LUE and LLE. Senses pain and LT in all 4's. No focal CN findings.   Psychiatric:    Comments: A little restless, impulsive    Assessment/Plan: 1. Functional deficits which require 3+ hours per day of interdisciplinary therapy in a comprehensive inpatient rehab setting. Physiatrist is providing close team supervision and 24 hour management of active medical problems listed below. Physiatrist and rehab team continue to assess barriers to discharge/monitor patient progress toward functional and medical goals  Care Tool:  Bathing    Body parts bathed  by patient: Right arm, Left arm, Chest, Abdomen, Front perineal area, Buttocks, Right upper leg, Left upper leg, Right lower leg, Face   Body parts bathed by helper: Left lower leg     Bathing assist Assist Level: Minimal Assistance - Patient > 75%     Upper Body Dressing/Undressing Upper body dressing   What is the patient wearing?: Pull over shirt    Upper body assist Assist Level: Set up assist    Lower Body Dressing/Undressing Lower body dressing      What is the patient wearing?: Underwear/pull up     Lower body assist Assist for lower body dressing: Minimal Assistance - Patient > 75%     Toileting Toileting    Toileting assist Assist for toileting: Contact Guard/Touching assist     Transfers Chair/bed transfer  Transfers assist     Chair/bed transfer assist level: Contact Guard/Touching assist     Locomotion Ambulation   Ambulation assist              Walk 10 feet activity   Assist           Walk 50 feet activity   Assist           Walk 150 feet activity   Assist  Walk 10 feet on uneven surface  activity   Assist           Wheelchair     Assist               Wheelchair 50 feet with 2 turns activity    Assist            Wheelchair 150 feet activity     Assist          Blood pressure 118/76, pulse (!) 108, temperature 98.5 F (36.9 C), resp. rate 16, height 5' 11"  (1.803 m), weight 90 kg, SpO2 96 %.    Medical Problem List and Plan: 1.  Altered mental status with nausea and vomiting secondary to right caudate head ICH with IVH likely secondary to hypertensive emergency cocaine use             -patient may shower             -ELOS/Goals: 7-10 days, mod I with PT, OT, SLP  -Initial CIR evaluations today 2.  Antithrombotics: -DVT/anticoagulation: SCDs             -antiplatelet therapy: N/A 3. Pain Management: Robaxin as needed, Tylenol as needed 4. Mood: Provide emotional  support             -antipsychotic agents: Risperdal 1 mg twice daily 5. Neuropsych: This patient is not capable of making decisions on his own behalf. 6. Skin/Wound Care: Routine skin checks 7. Fluids/Electrolytes/Nutrition: Routine in and outs with follow-up chemistries on admit. He is feeling hungry, edited diet order to indicate he may have double portions.  8.  Hypertension.  Clonidine 01 mg daily, Norvasc 10 mg daily, hydralazine 100 mg twice daily.  Monitor with increased mobility             -BP under fair control at present 9.  Ulcerative colitis.Xifaxan 550 mg twice daily, Entocort 9 mg daily             -pt having a lot of gas currently causing abdominal discomfort/distention             -will schedule simethicone for now 10.  GNR found 1 blood culture 12/16/2020.  Placed on empiric Rocephin 12/18/2020             -cx + diptheroids---dc abx, spoke with pharmacy 11.  AKI.  Follow-up chemistries 12.  Type 2 diabetes mellitus.  Hemoglobin A1c 5.9.  SSI. CBGs 128-164. Cr 1.51- consider starting metformin as creatinine returns to baseline. Provide dietary education. 13. Insomnia: grounds pass for time outside. Star melatonin 46m HS 14. Hypokalemia: f/u BMP on Monday.   LOS: 1 days A FACE TO FACE EVALUATION WAS PERFORMED  KMartha ClanP Feather Berrie 12/22/2020, 1:47 PM

## 2020-12-23 LAB — GLUCOSE, CAPILLARY
Glucose-Capillary: 144 mg/dL — ABNORMAL HIGH (ref 70–99)
Glucose-Capillary: 190 mg/dL — ABNORMAL HIGH (ref 70–99)
Glucose-Capillary: 119 mg/dL — ABNORMAL HIGH (ref 70–99)
Glucose-Capillary: 125 mg/dL — ABNORMAL HIGH (ref 70–99)

## 2020-12-23 MED ORDER — QUETIAPINE FUMARATE 25 MG PO TABS
25.0000 mg | ORAL_TABLET | Freq: Once | ORAL | Status: AC
  Administered 2020-12-23: 25 mg via ORAL
  Filled 2020-12-23: qty 1

## 2020-12-23 NOTE — Progress Notes (Signed)
About 1345 patient requested to use the restroom. While ambulating to the bathroom patient began walking out the main door. Patient reoriented and redirected towards room. Assistance called. Patient more agitated and not following directions well. Family now at bedside. Patient appears calm and cooperative. MD Raulkar notified. New order received. Continue to monitor.

## 2020-12-23 NOTE — Discharge Instructions (Addendum)
Inpatient Rehab Discharge Instructions  Matthew Francis Discharge date and time: No discharge date for patient encounter.   Activities/Precautions/ Functional Status: Activity: As tolerated Diet: Low-sodium Wound Care: Routine skin checks Functional status:  ___ No restrictions     ___ Walk up steps independently ___ 24/7 supervision/assistance   ___ Walk up steps with assistance ___ Intermittent supervision/assistance  ___ Bathe/dress independently ___ Walk with walker     __x_ Bathe/dress with assistance ___ Walk Independently    ___ Shower independently ___ Walk with assistance    ___ Shower with assistance ___ No alcohol     ___ Return to work/school ________  COMMUNITY REFERRALS UPON DISCHARGE:     Outpatient: PT     OT    ST                 Agency:[facility]     IBBCW:[phone]              Appointment Date/Time:Please expect follow-up within 7-10 business days to schedule your appointment. If you have not received follow-up, be sure to contact the site directly.*  Medical Equipment/Items Ordered:None recommended                                                 Agency/Supplier:N/A  GENERAL COMMUNITY RESOURCES FOR PATIENT/FAMILY: Please be sure to follow-up with [facility] [phone] option-3 with regard to application that was submitted. Reminder that this application takes atleast 21 days to determine eligibility. Please expect a packet in the mail within the next few weeks which requires you to complete and return.    Special Instructions: No driving smoking or alcohol   My questions have been answered and I understand these instructions. I will adhere to these goals and the provided educational materials after my discharge from the hospital.  Patient/Caregiver Signature _______________________________ Date __________  Clinician Signature _______________________________________ Date __________  Please bring this form and your medication list with you  to all your follow-up doctor's appointments.

## 2020-12-23 NOTE — IPOC Note (Signed)
Overall Plan of Care [facility]) Patient Details Name: Matthew Francis MRN: [id number] DOB: 09/21/1960  Admitting Diagnosis: ICH (intracerebral hemorrhage) [facility])  Hospital Problems: Principal Problem:   ICH (intracerebral hemorrhage) ([place])     Functional Problem List: Nursing Behavior, Edema, Endurance, Medication Management, Nutrition, Pain, Safety, Skin Integrity  PT Balance, Behavior, Endurance, Motor, Pain, Safety  OT (P) Balance, Behavior, Cognition, Endurance, Motor, Pain, Perception, Safety  SLP Cognition  TR         Basic ADL's: OT (P) Grooming, Bathing, Dressing, Toileting     Advanced  ADL's: OT (P) Laundry, Simple Meal Preparation, Light Housekeeping     Transfers: PT Bed Mobility, Bed to Chair, Car, Furniture  OT (P) Toilet, Tub/Shower     Locomotion: PT Ambulation, Stairs     Additional Impairments: OT (P) None  SLP Social Cognition   Problem Solving, Memory, Attention, Awareness  TR      Anticipated Outcomes Item Anticipated Outcome  Self Feeding independent  Swallowing      Basic self-care  mod I  Toileting  mod I   Bathroom Transfers supervision  Bowel/Bladder  N/A  Transfers  Mod I/ SUP  Locomotion  SUP  Communication     Cognition  Supervision  Pain  < 3  Safety/Judgment  Mod I and no falls   Therapy Plan: PT Intensity: Minimum of 1-2 x/day ,45 to 90 minutes PT Frequency: 5 out of 7 days PT Duration Estimated Length of Stay: 7-10 days OT Intensity: (P) Minimum of 1-2 x/day, 45 to 90 minutes OT Frequency: (P) 5 out of 7 days OT Duration/Estimated Length of Stay: 7-10 days SLP Intensity: Minumum of 1-2 x/day, 30 to 90 minutes SLP Frequency: 3 to 5 out of 7 days SLP Duration/Estimated Length of Stay: 10 days   Due to the current state of emergency, patients may not be receiving their 3-hours of Medicare-mandated therapy.   Team Interventions: Nursing Interventions Patient/Family Education, Disease Management/Prevention, Pain  Management, Medication Management, Skin Care/Wound Management, Discharge Planning, Psychosocial Support  PT interventions Ambulation/gait training, Balance/vestibular training, Discharge planning, Disease management/prevention, Functional mobility training, Neuromuscular re-education, Patient/family education, Stair training, Therapeutic Activities, Therapeutic Exercise, UE/LE Strength taining/ROM, UE/LE Coordination activities, Wheelchair propulsion/positioning, DME/adaptive equipment instruction  OT Interventions (P) Balance/vestibular training, Cognitive remediation/compensation, Neuromuscular re-education, Self Care/advanced ADL retraining, Therapeutic Exercise, UE/LE Strength taining/ROM, Pain management, Skin care/wound managment, DME/adaptive equipment instruction, Discharge planning, Functional mobility training, Therapeutic Activities, UE/LE Coordination activities, Patient/family education, Community reintegration  SLP Interventions Cognitive remediation/compensation, Internal/external aids, Therapeutic Activities, Therapeutic Exercise, Patient/family education, Functional tasks, Medication managment  TR Interventions    SW/CM Interventions     Barriers to Discharge MD  Medical stability  Nursing Decreased caregiver support, Home environment access/layout, Wound Care, Lack of/limited family support, Weight, Medication compliance, Behavior, Nutrition means Lives alone in 1 level home with 1 step to enter, no rails. Has Aunt and frient who will be available prn and intermittently.  PT Decreased caregiver support, Home environment access/layout, Lack of/limited family support, Insurance for SNF coverage, Medication compliance, Behavior, Nutrition means    OT (P) Decreased caregiver support    SLP Decreased caregiver support    SW       Team Discharge Planning: Destination: PT-Home ,OT- Home , SLP-Home Projected Follow-up: PT-24 hour supervision/assistance, OT-  Home health OT,  SLP-None Projected Equipment Needs: PT-To be determined, OT- Tub/shower bench, To be determined, SLP-None recommended by SLP Equipment Details: PT- , OT-  Patient/family involved in discharge planning: PT- Patient,  OT-Patient, SLP-Patient  MD ELOS: 7-10 days Medical Rehab Prognosis:  Excellent Assessment: The patient has been admitted for CIR therapies with the diagnosis of ICH. The team will be addressing functional mobility, strength, stamina, balance, safety, adaptive techniques and equipment, self-care, bowel and bladder mgt, patient and caregiver education, NMR, cognition, communication, behavior, community reentry. Goals have been set at mod I to supervision with self-care, supervision for mobility and cognition.   Due to the current state of emergency, patients may not be receiving their 3 hours per day of Medicare-mandated therapy.    Meredith Staggers, MD, FAAPMR     See Team Conference Notes for weekly updates to the plan of care

## 2020-12-23 NOTE — Plan of Care (Signed)
  Problem: Consults Goal: RH STROKE PATIENT EDUCATION Description: See Patient Education module for education specifics  12/23/2020 1123 by Hillery Jacks, RN Outcome: Progressing 12/23/2020 1051 by Hillery Jacks, RN Outcome: Progressing Goal: Diabetes Guidelines if Diabetic/Glucose > 140 Description: If diabetic or lab glucose is > 140 mg/dl - Initiate Diabetes/Hyperglycemia Guidelines & Document Interventions  12/23/2020 1123 by Hillery Jacks, RN Outcome: Progressing 12/23/2020 1051 by Hillery Jacks, RN Outcome: Progressing   Problem: RH SKIN INTEGRITY Goal: RH STG MAINTAIN SKIN INTEGRITY WITH ASSISTANCE Description: STG Maintain Skin Integrity With Mod I Assistance. 12/23/2020 1123 by Hillery Jacks, RN Outcome: Progressing 12/23/2020 1051 by Hillery Jacks, RN Outcome: Progressing   Problem: RH SAFETY Goal: RH STG ADHERE TO SAFETY PRECAUTIONS W/ASSISTANCE/DEVICE Description: STG Adhere to Safety Precautions With Mode I Assistance/Device. 12/23/2020 1123 by Hillery Jacks, RN Outcome: Progressing 12/23/2020 1051 by Hillery Jacks, RN Outcome: Progressing Goal: RH STG DECREASED RISK OF FALL WITH ASSISTANCE Description: STG Decreased Risk of Fall With Mod I Assistance. 12/23/2020 1123 by Hillery Jacks, RN Outcome: Progressing 12/23/2020 1051 by Hillery Jacks, RN Outcome: Progressing   Problem: RH COGNITION-NURSING Goal: RH STG USES MEMORY AIDS/STRATEGIES W/ASSIST TO PROBLEM SOLVE Description: STG Uses Memory Aids/Strategies With cues and reminders to Problem Solve. 12/23/2020 1123 by Hillery Jacks, RN Outcome: Progressing 12/23/2020 1051 by Hillery Jacks, RN Outcome: Progressing Goal: RH STG ANTICIPATES NEEDS/CALLS FOR ASSIST W/ASSIST/CUES Description: STG Anticipates Needs/Calls for Assist With cues and reminders. 12/23/2020 1123 by Hillery Jacks, RN Outcome: Progressing 12/23/2020 1051 by Hillery Jacks, RN Outcome: Progressing   Problem: RH PAIN  MANAGEMENT Goal: RH STG PAIN MANAGED AT OR BELOW PT'S PAIN GOAL Description: < 3 on a 0-10 pain scale. 12/23/2020 1123 by Hillery Jacks, RN Outcome: Progressing 12/23/2020 1051 by Hillery Jacks, RN Outcome: Progressing   Problem: RH KNOWLEDGE DEFICIT Goal: RH STG INCREASE KNOWLEDGE OF DIABETES Description: Patient will be able to demonstrate knowledge of diabetes management, insulin management, blood sugar management with educational materials and handouts provided by staff  independently at discharge. 12/23/2020 1123 by Hillery Jacks, RN Outcome: Progressing 12/23/2020 1051 by Hillery Jacks, RN Outcome: Progressing Goal: RH STG INCREASE KNOWLEDGE OF HYPERTENSION Description: Patient will be able demonstrate knowledge of HTN medication, BP control, BP parameters with educational materials and handouts provided by staff independently at discharge. 12/23/2020 1123 by Hillery Jacks, RN Outcome: Progressing 12/23/2020 1051 by Hillery Jacks, RN Outcome: Progressing

## 2020-12-23 NOTE — Progress Notes (Signed)
Occupational Therapy Session Note  Patient Details  Name: Matthew Francis MRN: [id number] Date of Birth: 1960/09/14  Today's Date: 12/23/2020 OT Individual Time: 3338-3291 OT Individual Time Calculation (min): 28 min  45 minutes missed   Short Term Goals: Week 1:  OT Short Term Goal 1 (Week 1): STGs=LTGs 2/2 ELOS  Skilled Therapeutic Interventions/Progress Updates:    Pt greeted in bed, sound asleep. Very difficult to wake up and when he did wake up, pt would open his eyes to look at therapist and then close them again without verbally responding. Attempted to engage pt for ~6 minutes. Per NT, pt is a very sound sleeper and difficult to wake when he sleeps. Pt just laid down to rest, per NT. Time missed due to fatigue/pt refusal.   2nd Session 1:1 tx (28 min) Pt greeted while exiting the bathroom, nursing staff present. He washed his hands at the sink and then ambulated with OT and CGA to select clothing from drawers. Pt carried his clothing to EOB, still ambulating with CGA without AD. Worked on functional cognition, motor planning, and problem solving while participating in dressing tasks. Note that pt donned a second pair of gripper socks over the old gripper socks he was wearing. Pt also donned his new shirt inside out, over the soiled shirt he was wearing. Pt refused to correct himself or have OT assist with correction. After donning his pants pt reported that he was looking for his car keys so that he could "go get something to eat." Searched in one of the pairs of jeans in his room but unable to find keys. Pt refused to participate in structured activity or eat more of his lunch, ambulated in room with CGA to gather items to put inside of his jean pockets. He held onto OT's arm and stated "come here, I want to kiss your forehead." Informed pt that this was not appropriate behavior with questionable carryover of education. Pt refused to transfer to bed or have safety belt applied while sitting in  the recliner, pt stating "I'm the boss of myself" and "Jesus will protect me." His aunt then arrived, tried to assist therapist with having pt either sit up and have his belt donned or lie down in bed. Pt becoming more irritated, stating "Jesus" and raising his hands in the air. He then began ambulating into the hallway and pushed OT's hands away when CGA was provided. He remained in the hallway, in care of nursing staff for deescalation and returning to his room.   Therapy Documentation Precautions:  Precautions Precautions: Fall Precaution Comments: mild L hemi Restrictions Weight Bearing Restrictions: No  Pain: unrated, pt stating "I've got no complaints"   ADL: ADL Eating: Set up Where Assessed-Eating: Chair Grooming: Setup Where Assessed-Grooming: Edge of bed Upper Body Bathing: Supervision/safety Where Assessed-Upper Body Bathing: Shower Lower Body Bathing: Supervision/safety Where Assessed-Lower Body Bathing: Shower Upper Body Dressing: Setup Where Assessed-Upper Body Dressing: Edge of bed Lower Body Dressing: Minimal assistance Where Assessed-Lower Body Dressing: Edge of bed Toileting: Contact guard Where Assessed-Toileting: Glass blower/designer: Therapist, music Method: Ambulating (with RW) Science writer: Emergency planning/management officer Transfer: Curator Method: Ambulating (with RW) Youth worker: Grab bars    Therapy/Group: Individual Therapy  [name] 12/23/2020, 3:13 PM

## 2020-12-23 NOTE — Plan of Care (Signed)
  Problem: Consults Goal: RH STROKE PATIENT EDUCATION Description: See Patient Education module for education specifics  Outcome: Progressing Goal: Diabetes Guidelines if Diabetic/Glucose > 140 Description: If diabetic or lab glucose is > 140 mg/dl - Initiate Diabetes/Hyperglycemia Guidelines & Document Interventions  Outcome: Progressing   Problem: RH SKIN INTEGRITY Goal: RH STG MAINTAIN SKIN INTEGRITY WITH ASSISTANCE Description: STG Maintain Skin Integrity With Mod I Assistance. Outcome: Progressing   Problem: RH SAFETY Goal: RH STG ADHERE TO SAFETY PRECAUTIONS W/ASSISTANCE/DEVICE Description: STG Adhere to Safety Precautions With Mode I Assistance/Device. Outcome: Progressing Goal: RH STG DECREASED RISK OF FALL WITH ASSISTANCE Description: STG Decreased Risk of Fall With Mod I Assistance. Outcome: Progressing   Problem: RH COGNITION-NURSING Goal: RH STG USES MEMORY AIDS/STRATEGIES W/ASSIST TO PROBLEM SOLVE Description: STG Uses Memory Aids/Strategies With cues and reminders to Problem Solve. Outcome: Progressing Goal: RH STG ANTICIPATES NEEDS/CALLS FOR ASSIST W/ASSIST/CUES Description: STG Anticipates Needs/Calls for Assist With cues and reminders. Outcome: Progressing   Problem: RH PAIN MANAGEMENT Goal: RH STG PAIN MANAGED AT OR BELOW PT'S PAIN GOAL Description: < 3 on a 0-10 pain scale. Outcome: Progressing   Problem: RH KNOWLEDGE DEFICIT Goal: RH STG INCREASE KNOWLEDGE OF DIABETES Description: Patient will be able to demonstrate knowledge of diabetes management, insulin management, blood sugar management with educational materials and handouts provided by staff  independently at discharge. Outcome: Progressing Goal: RH STG INCREASE KNOWLEDGE OF HYPERTENSION Description: Patient will be able demonstrate knowledge of HTN medication, BP control, BP parameters with educational materials and handouts provided by staff independently at discharge. Outcome: Progressing

## 2020-12-23 NOTE — Progress Notes (Signed)
PROGRESS NOTE   Subjective/Complaints: This morning he was upset that his breakfast had not been delivered by 8:30am and that his room was too cold and it took 3 hours for him to get a blanket. He has no other complaints  ROS: +insomnia as per patient, +agitation as per Caryl Pina RN   Objective:   No results found. No results for input(s): WBC, HGB, HCT, PLT in the last 72 hours. No results for input(s): NA, K, CL, CO2, GLUCOSE, BUN, CREATININE, CALCIUM in the last 72 hours.  Intake/Output Summary (Last 24 hours) at 12/23/2020 1623 Last data filed at 12/23/2020 1020 Gross per 24 hour  Intake 340 ml  Output 200 ml  Net 140 ml        Physical Exam: Vital Signs Blood pressure 94/67, pulse 92, temperature 98.8 F (37.1 C), resp. rate 14, height 5' 11"  (1.803 m), weight 90 kg, SpO2 100 %. Gen: no distress, normal appearing HEENT: oral mucosa pink and moist, NCAT Cardio: Reg rate Chest: normal effort, normal rate of breathing Abd: soft, non-distended Ext: no edema Psych: pleasant, normal affect Neurological:    Mental Status: He is alert.    Comments: Patient is alert sitting in chair with blanker over him. Slightly irritated.  Makes eye contact with examiner.  Provides name and age.  Oriented to month, day of week, year, day with cues. STM deficits. Impaired insight and awareness. Could provide some biographical information. Moves all 4 limbs. Mild left PD and decreased FMC/apraxia LUE and LLE. Senses pain and LT in all 4's. No focal CN findings.   Psychiatric:    Comments: A little restless, impulsive    Assessment/Plan: 1. Functional deficits which require 3+ hours per day of interdisciplinary therapy in a comprehensive inpatient rehab setting. Physiatrist is providing close team supervision and 24 hour management of active medical problems listed below. Physiatrist and rehab team continue to assess barriers to  discharge/monitor patient progress toward functional and medical goals  Care Tool:  Bathing    Body parts bathed by patient: Right arm, Left arm, Chest, Abdomen, Front perineal area, Buttocks, Right upper leg, Left upper leg, Right lower leg, Face   Body parts bathed by helper: Left lower leg     Bathing assist Assist Level: Minimal Assistance - Patient > 75%     Upper Body Dressing/Undressing Upper body dressing   What is the patient wearing?: Pull over shirt    Upper body assist Assist Level: Minimal Assistance - Patient > 75%    Lower Body Dressing/Undressing Lower body dressing      What is the patient wearing?: Underwear/pull up     Lower body assist Assist for lower body dressing: Minimal Assistance - Patient > 75%     Toileting Toileting    Toileting assist Assist for toileting: Contact Guard/Touching assist     Transfers Chair/bed transfer  Transfers assist     Chair/bed transfer assist level: Contact Guard/Touching assist     Locomotion Ambulation   Ambulation assist      Assist level: Contact Guard/Touching assist Assistive device: No Device Max distance: 150 ft   Walk 10 feet activity   Assist  Assist level: Contact Guard/Touching assist Assistive device: No Device   Walk 50 feet activity   Assist    Assist level: Contact Guard/Touching assist Assistive device: No Device    Walk 150 feet activity   Assist    Assist level: Contact Guard/Touching assist Assistive device: No Device    Walk 10 feet on uneven surface  activity   Assist Walk 10 feet on uneven surfaces activity did not occur: Safety/medical concerns         Wheelchair     Assist Will patient use wheelchair at discharge?: No   Wheelchair activity did not occur: N/A         Wheelchair 50 feet with 2 turns activity    Assist    Wheelchair 50 feet with 2 turns activity did not occur: N/A       Wheelchair 150 feet activity      Assist  Wheelchair 150 feet activity did not occur: N/A       Blood pressure 94/67, pulse 92, temperature 98.8 F (37.1 C), resp. rate 14, height 5' 11"  (1.803 m), weight 90 kg, SpO2 100 %.    Medical Problem List and Plan: 1.  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  Antithrombotics: -DVT/anticoagulation: SCDs             -antiplatelet therapy: N/A 3. Pain Management: Robaxin as needed, Tylenol as needed 4. Mood: Provide emotional support             -antipsychotic agents: Risperdal 1 mg twice daily 5. Neuropsych: This patient is not capable of making decisions on his own behalf. 6. Skin/Wound Care: Routine skin checks 7. Fluids/Electrolytes/Nutrition: Routine in and outs with follow-up chemistries on admit. He is feeling hungry, edited diet order to indicate he may have double portions.  8.  Hypertension.  Clonidine 01 mg daily, Norvasc 10 mg daily, hydralazine 100 mg twice daily.  Monitor with increased mobility             -BP under fair control at present 9.  Ulcerative colitis.Xifaxan 550 mg twice daily, Entocort 9 mg daily             -pt having a lot of gas currently causing abdominal discomfort/distention             -will schedule simethicone for now 10.  GNR found 1 blood culture 12/16/2020.  Placed on empiric Rocephin 12/18/2020             -cx + diptheroids---dc abx, spoke with pharmacy 11.  AKI.  Cr 1.51, repeat tomorrow.  12.  Type 2 diabetes mellitus.  Hemoglobin A1c 5.9.  SSI. CBGs 113-190. Cr 1.51- consider starting metformin as creatinine returns to baseline. Provide dietary education. 13. Insomnia: grounds pass for time outside. Continue melatonin 60m HS 14. Hypokalemia: f/u BMP on Monday.  15. Agitation: added one time dose seroquel. Continue  risperdal.    LOS: 2 days A FACE TO FACE EVALUATION WAS PERFORMED  KClide DeutscherRaulkar 12/23/2020, 4:23 PM

## 2020-12-24 DIAGNOSIS — E871 Hypo-osmolality and hyponatremia: Secondary | ICD-10-CM

## 2020-12-24 DIAGNOSIS — E876 Hypokalemia: Secondary | ICD-10-CM

## 2020-12-24 LAB — CBC WITH DIFFERENTIAL/PLATELET
Abs Immature Granulocytes: 0.01 10*3/uL (ref 0.00–0.07)
Basophils Absolute: 0.1 10*3/uL (ref 0.0–0.1)
Basophils Relative: 1 %
Eosinophils Absolute: 0.2 10*3/uL (ref 0.0–0.5)
Eosinophils Relative: 4 %
HCT: 40.6 % (ref 39.0–52.0)
Hemoglobin: 13.1 g/dL (ref 13.0–17.0)
Immature Granulocytes: 0 %
Lymphocytes Relative: 39 %
Lymphs Abs: 2.3 10*3/uL (ref 0.7–4.0)
MCH: 30.6 pg (ref 26.0–34.0)
MCHC: 32.3 g/dL (ref 30.0–36.0)
MCV: 94.9 fL (ref 80.0–100.0)
Monocytes Absolute: 0.5 10*3/uL (ref 0.1–1.0)
Monocytes Relative: 9 %
Neutro Abs: 2.7 10*3/uL (ref 1.7–7.7)
Neutrophils Relative %: 47 %
Platelets: 204 10*3/uL (ref 150–400)
RBC: 4.28 MIL/uL (ref 4.22–5.81)
RDW: 13.8 % (ref 11.5–15.5)
WBC: 5.9 10*3/uL (ref 4.0–10.5)
nRBC: 0 % (ref 0.0–0.2)

## 2020-12-24 LAB — COMPREHENSIVE METABOLIC PANEL
ALT: 79 U/L — ABNORMAL HIGH (ref 0–44)
AST: 34 U/L (ref 15–41)
Albumin: 3.5 g/dL (ref 3.5–5.0)
Alkaline Phosphatase: 88 U/L (ref 38–126)
Anion gap: 11 (ref 5–15)
BUN: 16 mg/dL (ref 6–20)
CO2: 18 mmol/L — ABNORMAL LOW (ref 22–32)
Calcium: 10.3 mg/dL (ref 8.9–10.3)
Chloride: 104 mmol/L (ref 98–111)
Creatinine, Ser: 1.48 mg/dL — ABNORMAL HIGH (ref 0.61–1.24)
GFR, Estimated: 55 mL/min — ABNORMAL LOW (ref >60)
Glucose, Bld: 146 mg/dL — ABNORMAL HIGH (ref 70–99)
Potassium: 4.2 mmol/L (ref 3.5–5.1)
Sodium: 133 mmol/L — ABNORMAL LOW (ref 135–145)
Total Bilirubin: 0.6 mg/dL (ref 0.3–1.2)
Total Protein: 6.8 g/dL (ref 6.5–8.1)

## 2020-12-24 LAB — GLUCOSE, CAPILLARY
Glucose-Capillary: 130 mg/dL — ABNORMAL HIGH (ref 70–99)
Glucose-Capillary: 102 mg/dL — ABNORMAL HIGH (ref 70–99)
Glucose-Capillary: 231 mg/dL — ABNORMAL HIGH (ref 70–99)
Glucose-Capillary: 115 mg/dL — ABNORMAL HIGH (ref 70–99)

## 2020-12-24 MED ORDER — QUETIAPINE FUMARATE 25 MG PO TABS
25.0000 mg | ORAL_TABLET | Freq: Every evening | ORAL | Status: DC | PRN
  Administered 2020-12-24 – 2020-12-31 (×8): 25 mg via ORAL
  Filled 2020-12-24 (×8): qty 1

## 2020-12-24 NOTE — Progress Notes (Signed)
Physical Therapy Session Note  Patient Details  Name: Matthew Francis MRN: [id number] Date of Birth: 24-Dec-1960  Today's Date: 12/24/2020 PT Individual Time: 0800-0900 PT Individual Time Calculation (min): 60 min   Short Term Goals: Week 1:  PT Short Term Goal 1 (Week 1): STG = LTG d/t ELOS  Skilled Therapeutic Interventions/Progress Updates:     Patient in bed upon PT arrival. Patient alert and agreeable to PT session. Patient denied pain during session. Retrieved donated clothing for patient, as he does not have clean clothing here and does not tolerate the fabric of the paper scrubs.   Therapeutic Activity: Bed Mobility: Patient performed supine to sit independently. Patient changed his shirt with set-up assist in sitting.  Transfers: Patient performed sit to/from stand and stand pivot transfers with supervision-mod I without AD. Provided verbal cues for waiting for therapist to be within arms reach for safety x1. Patient donned pants in standing with min A for standing balance, educated on performing task in sitting for safety, patient in agreement, but continued to complete task in standing.  Patient performed ambulatory transfers to/from the bathroom x2 with CGA progressing to close supervision without AD. Patient ambulated with mild reduced step height resulting in heel scuffing on L. Patient was continent of bowl x2 during toileting. Performed peri-care, lower body clothing management, and hand hygiene independently.   6 Min Walk Test:  Instructed patient to ambulate as quickly and as safely as possible for 6 minutes using LRAD. Patient was allowed to take standing rest breaks without stopping the test, but if the patient required a sitting rest break the clock would be stopped and the test would be over.  Results: 1783 feet (543.5 meters, Avg speed 1.5 m/s) without an AD HR 132 bpm, SPO2 100%, BP 104/58, and RPE 4/10 at end of test. Patient slow to recover, required >3 min to return to  118 bpm.  Results indicate that the patient has reduced endurance with ambulation compared to age matched norms (572 meters).   Neuromuscular Re-ed: Patient performed the Berg Balance Test: Patient demonstrates low fall risk as noted by score of 52/56 on Berg Balance Scale.  (<36= high risk for falls, close to 100%; 37-45 significant >80%; 46-51 moderate >50%; 52-55 lower >25%) Educated patient on results and interpretation, patient agreed to assess dynamic gait during Functional Gait assessment, however, reported need to have a BM at beginning of testing and returned to the room.   Patient in recliner in the room at end of session with breaks locked, seat belt alarm set, and all needs within reach.   Therapy Documentation Precautions:  Precautions Precautions: Fall Precaution Comments: mild L hemi Restrictions Weight Bearing Restrictions: No Balance: Balance Balance Assessed: Yes Standardized Balance Assessment Standardized Balance Assessment: Berg Balance Test Berg Balance Test Sit to Stand: Able to stand without using hands and stabilize independently Standing Unsupported: Able to stand safely 2 minutes Sitting with Back Unsupported but Feet Supported on Floor or Stool: Able to sit safely and securely 2 minutes Stand to Sit: Sits safely with minimal use of hands Transfers: Able to transfer safely, minor use of hands Standing Unsupported with Eyes Closed: Able to stand 10 seconds safely Standing Ubsupported with Feet Together: Able to place feet together independently and stand 1 minute safely From Standing, Reach Forward with Outstretched Arm: Can reach forward >12 cm safely (5") From Standing Position, Pick up Object from Floor: Able to pick up shoe, needs supervision From Standing Position, Turn to Look  Behind Over each Shoulder: Looks behind from both sides and weight shifts well Turn 360 Degrees: Able to turn 360 degrees safely in 4 seconds or less Standing Unsupported,  Alternately Place Feet on Step/Stool: Able to stand independently and safely and complete 8 steps in 20 seconds Standing Unsupported, One Foot in Front: Able to plae foot ahead of the other independently and hold 30 seconds Standing on One Leg: Able to lift leg independently and hold 5-10 seconds Total Score: 52/56    Therapy/Group: Individual Therapy  Kada Friesen L Erikka Follmer PT, DPT  12/24/2020, 4:11 PM

## 2020-12-24 NOTE — Progress Notes (Signed)
Speech Language Pathology TBI Note  Patient Details  Name: NAVARRE DIANA MRN: [id number] Date of Birth: 10-06-1960  Today's Date: 12/24/2020 SLP Individual Time: 1300-1315 SLP Individual Time Calculation (min): 15 min  Short Term Goals: Week 1: SLP Short Term Goal 1 (Week 1): Pt will recall novel information with 90% accuracy min cues for compensatory strategies SLP Short Term Goal 2 (Week 1): Pt will increase orientation to time and location with 100% accuracy independent for use of visual aids SLP Short Term Goal 3 (Week 1): Pt will increase awareness of errors during structured task with Supervision A cues SLP Short Term Goal 4 (Week 1): Pt will complete mildly complex problem solving tasks with min A SLP Short Term Goal 5 (Week 1): Pt will utilize strateiges as trained to reduce impulsivity during functional tasks with min-mod A SLP Short Term Goal 6 (Week 1): Pt will participate in alternating attention tasks with min-mod A  Skilled Therapeutic Interventions:Skilled ST services focused on cognitive skills. Pt was asleep upon entering room and with lunch tray untouched. Pt was unable to open eyes with verbal and tactile cues. SLP applied cold compress in which pt let out a moan and eyes flickered open, but unable to maintain eyes open or demonstrate alertness. Pt missed 30 minutes of skilled ST services due to being asleep. Pt was left in room with call bell within reach and bed alarm set. SLP recommends to continue skilled services.     Pain Pain Assessment Pain Score: 0-No pain  Agitated Behavior Scale: TBI      Therapy/Group: Individual Therapy  Dameisha Tschida  [facility] 12/24/2020, 4:32 PM

## 2020-12-24 NOTE — Progress Notes (Signed)
PROGRESS NOTE   Subjective/Complaints: Up on eob. Feels "tired" since his stroke. Impulsive, getting out of bed frequently on his own.   ROS: Patient denies fever, rash, sore throat, blurred vision, nausea, vomiting, diarrhea, cough, shortness of breath or chest pain, joint or back pain, headache, or mood change.    Objective:   No results found. Recent Labs    12/24/20 0939  WBC 5.9  HGB 13.1  HCT 40.6  PLT 204   Recent Labs    12/24/20 0939  NA 133*  K 4.2  CL 104  CO2 18*  GLUCOSE 146*  BUN 16  CREATININE 1.48*  CALCIUM 10.3    Intake/Output Summary (Last 24 hours) at 12/24/2020 1133 Last data filed at 12/24/2020 0114 Gross per 24 hour  Intake 240 ml  Output 450 ml  Net -210 ml        Physical Exam: Vital Signs Blood pressure 109/72, pulse (!) 101, temperature 97.9 F (36.6 C), resp. rate 18, height 5' 11"  (1.803 m), weight 90 kg, SpO2 99 %. Constitutional: No distress . Vital signs reviewed. HEENT: EOMI, oral membranes moist Neck: supple Cardiovascular: RRR without murmur. No JVD    Respiratory/Chest: CTA Bilaterally without wheezes or rales. Normal effort    GI/Abdomen: BS +, non-tender, non-distended Ext: no clubbing, cyanosis, or edema Psych: pleasant but a little anxious  Neurological:    Mental Status: He is alert.    Comments: sitting eob. Good sitting balance. Thought processing tangential to circumferential. Normal speech, language.  Moves all 4 limbs. Mild left PD and decreased FMC/apraxia LUE and LLE ongoing. Senses pain and LT in all 4's. No focal CN findings.       Assessment/Plan: 1. Functional deficits which require 3+ hours per day of interdisciplinary therapy in a comprehensive inpatient rehab setting. Physiatrist is providing close team supervision and 24 hour management of active medical problems listed below. Physiatrist and rehab team continue to assess barriers to  discharge/monitor patient progress toward functional and medical goals  Care Tool:  Bathing    Body parts bathed by patient: Right arm, Left arm, Chest, Abdomen, Front perineal area, Buttocks, Right upper leg, Left upper leg, Right lower leg, Face   Body parts bathed by helper: Left lower leg     Bathing assist Assist Level: Minimal Assistance - Patient > 75%     Upper Body Dressing/Undressing Upper body dressing   What is the patient wearing?: Pull over shirt    Upper body assist Assist Level: Minimal Assistance - Patient > 75%    Lower Body Dressing/Undressing Lower body dressing      What is the patient wearing?: Underwear/pull up     Lower body assist Assist for lower body dressing: Minimal Assistance - Patient > 75%     Toileting Toileting    Toileting assist Assist for toileting: Supervision/Verbal cueing     Transfers Chair/bed transfer  Transfers assist     Chair/bed transfer assist level: Supervision/Verbal cueing     Locomotion Ambulation   Ambulation assist      Assist level: Contact Guard/Touching assist Assistive device: No Device Max distance: 150 ft   Walk 10 feet activity  Assist     Assist level: Contact Guard/Touching assist Assistive device: No Device   Walk 50 feet activity   Assist    Assist level: Contact Guard/Touching assist Assistive device: No Device    Walk 150 feet activity   Assist    Assist level: Contact Guard/Touching assist Assistive device: No Device    Walk 10 feet on uneven surface  activity   Assist Walk 10 feet on uneven surfaces activity did not occur: Safety/medical concerns         Wheelchair     Assist Will patient use wheelchair at discharge?: No   Wheelchair activity did not occur: N/A         Wheelchair 50 feet with 2 turns activity    Assist    Wheelchair 50 feet with 2 turns activity did not occur: N/A       Wheelchair 150 feet activity      Assist  Wheelchair 150 feet activity did not occur: N/A       Blood pressure 109/72, pulse (!) 101, temperature 97.9 F (36.6 C), resp. rate 18, height 5' 11"  (1.803 m), weight 90 kg, SpO2 99 %.    Medical Problem List and Plan: 1.  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  Antithrombotics: -DVT/anticoagulation: SCDs             -antiplatelet therapy: N/A 3. Pain Management: Robaxin as needed, Tylenol as needed 4. Mood: Provide emotional support             -antipsychotic agents: Risperdal 1 mg twice daily for agitated behavior   -add prn seroquel for sleep (received 1x dose this weekend) 5. Neuropsych: This patient is not capable of making decisions on his own behalf. 6. Skin/Wound Care: Routine skin checks 7. Fluids/Electrolytes/Nutrition: double portions  I personally reviewed the patient's labs today.    -mild hyponatremia--f/u later this week  -AST sl elevated. Likely reactive, LFT's decreased in general 8.  Hypertension.  Clonidine 01 mg daily, Norvasc 10 mg daily, hydralazine 100 mg twice daily.  Monitor with increased mobility             -BP under fair control at present 9.  Ulcerative colitis.Xifaxan 550 mg twice daily, Entocort 9 mg daily             -simethicone for gas/bloating 10.  GNR found 1 blood culture 12/16/2020.  Placed on empiric Rocephin 12/18/2020             -cx + diptheroids---dc abx, spoke with pharmacy 11.  AKI.  Cr 1.51---> 1.48 7/11 12.  Type 2 diabetes mellitus.  Hemoglobin A1c 5.9.  SSI. CBGs 113-190. Cr 1.51- consider starting metformin as creatinine returns to baseline. Provide dietary education. 13. Insomnia: grounds pass for time outside. Continue melatonin 52m HS 14. Hypokalemia: resolved    .    LOS: 3 days A FACE TO FACE EVALUATION WAS PERFORMED  ZMeredith Staggers7/04/2021, 11:33 AM

## 2020-12-24 NOTE — Progress Notes (Addendum)
Patient agitated, restless, kept on going to the BR and asking for drinks. Seems forgetting he already had several drinks. Talks about going to his Aunt tonight, reoriented. Given one time dose of seroquel and robaxin for back pain. Still restless, had to be wheeled in a wheelchair to the Nurses station till he felt sleepy. Wheeled back to his room and settled. Tele sitter still ongoing.

## 2020-12-24 NOTE — Progress Notes (Signed)
Occupational Therapy Session Note  Patient Details  Name: Matthew Francis MRN: [id number] Date of Birth: 02/14/1961  Today's Date: 12/24/2020 OT Individual Time: 1445-1530 OT Individual Time Calculation (min): 45 min    Short Term Goals: Week 1:  OT Short Term Goal 1 (Week 1): STGs=LTGs 2/2 ELOS  Skilled Therapeutic Interventions/Progress Updates:    Pt received in room and consented to OT tx. Pt was trying to get up out of chair to reach shoes, educated in importance of calling for help to decrease risk for falling with pt verbalizing understanding. Pt able to don and tie shoes seated in recliner with setup. Pt then walked with CGA and no device from room to therapy gym for instruction and training in [place]. Pt instructed in 5# db exercises including elbow flexion, chest press, shoulder press, shoulder flexion for 4x10 with min cuing for proper technique and good carryover. In the middle of exercises, pt requires bathroom break, walked back to room with no device and completed toileting with SUP. Pt then washed hands sink side with SUP and walked back to therapy gym with SUP to finish exercises. After tx, pt helped back to room and left in recliner with alarm on, Telesitter present, and all needs met.   Therapy Documentation Precautions:  Precautions Precautions: Fall Precaution Comments: mild L hemi Restrictions Weight Bearing Restrictions: No  Vital Signs: Therapy Vitals Temp: 98.6 F (37 C) Pulse Rate: 73 Resp: 18 BP: (!) 99/56 Patient Position (if appropriate): Lying Oxygen Therapy SpO2: 100 % O2 Device: Room Air Pain: Pain Assessment Pain Score: 0-No pain   Therapy/Group: Individual Therapy  Darely Becknell 12/24/2020, 2:29 PM

## 2020-12-24 NOTE — Care Management (Signed)
Inpatient Rehabilitation Center Individual Statement of Services  Patient Name:  Matthew Francis  Date:  12/24/2020  Welcome to the [facility] [place] [facility].  Our goal is to provide you with an individualized program based on your diagnosis and situation, designed to meet your specific needs.  With this comprehensive rehabilitation program, you will be expected to participate in at least 3 hours of rehabilitation therapies Monday-Friday, with modified therapy programming on the weekends.  Your rehabilitation program will include the following services:  Physical Therapy (PT), Occupational Therapy (OT), Speech Therapy (ST), 24 hour per day rehabilitation nursing, Therapeutic Recreaction (TR), Psychology, Neuropsychology, Care Coordinator, Rehabilitation Medicine, [place], and Other  Weekly team conferences will be held on Tuesdays to discuss your progress.  Your Inpatient Rehabilitation Care Coordinator will talk with you frequently to get your input and to update you on team discussions.  Team conferences with you and your family in attendance may also be held.  Expected length of stay: 7-10 days    Overall anticipated outcome: Supervision  Depending on your progress and recovery, your program may change. Your Inpatient Rehabilitation Care Coordinator will coordinate services and will keep you informed of any changes. Your Inpatient Rehabilitation Care Coordinator's name and contact numbers are listed  below.  The following services may also be recommended but are not provided by the [facility] will be made to provide these services after discharge if needed.  Arrangements include referral to agencies that provide these services.  Your insurance has been verified to be:  [place] primary  doctor is:  Charline Bills  Pertinent information will be shared with your doctor and your insurance company.  Inpatient Rehabilitation Care Coordinator:  Cathleen Corti [phone] or ([phone]  Information discussed with and copy given to patient by: Rana Snare, 12/24/2020, 10:19 AM

## 2020-12-24 NOTE — Progress Notes (Signed)
Occupational Therapy Session Note  Patient Details  Name: Matthew Francis MRN: [id number] Date of Birth: 1961-02-01  Today's Date: 12/24/2020 OT Individual Time: 1000-1058 OT Individual Time Calculation (min): 58 min    Short Term Goals: Week 1:  OT Short Term Goal 1 (Week 1): STGs=LTGs 2/2 ELOS  Skilled Therapeutic Interventions/Progress Updates:     Pt received in recliner with unrated hamstring pain. Demo stretches for pain relief with use of sheet and provided heat packs at end of session. RN alerted to need for tylenol at end of session. No agitation noted this date as well as no requests to leave  ADL:  Pt dressed from previous PT session.  Pt completes grooming in standing with CGA fading to S overall for functional mobility around room to locate needed items. Mild environmental L inattention noted Pt completes toileting with close S for STS for clothing management and distant S for void/seated hygiene. Pt completes toileting transfer with CGA fading to S   Therapeutic activity Seated activities completed d/t pain in L hamstring.  BITS memory: consistently selecting words in incorrect order at levels 4-5 words. When pt cued to slow down and say words out loud able to get up to 5 words correct BITS trail making B test: no errors 1 min 53 seconds BITS visual scanning with central fixation: accuracy 66% d/t rushing and locating 2/10 correct changes in central fixation.   Seated ball toss with naming of car brands in alphabetical order. Pt requires MOD cuing to move onto next letter instead of repeat same letter OT stated.  Pt left at end of session in EOB as pt refuses the safety belt with bed exit alarm on, call light in reach and all needs met   Therapy Documentation Precautions:  Precautions Precautions: Fall Precaution Comments: mild L hemi Restrictions Weight Bearing Restrictions: No General:   Vital Signs: Therapy Vitals Temp: 97.9 F (36.6 C) Pulse Rate: (!)  101 Resp: 18 BP: 109/72 Patient Position (if appropriate): Lying Oxygen Therapy SpO2: 99 % O2 Device: Room Air Pain: Pain Assessment Pain Score: 0-No pain Faces Pain Scale: No hurt PAINAD (Pain Assessment in Advanced Dementia) Breathing: normal Negative Vocalization: none Facial Expression: smiling or inexpressive Body Language: relaxed Consolability: no need to console PAINAD Score: 0 ADL: ADL Eating: Set up Where Assessed-Eating: Chair Grooming: Setup Where Assessed-Grooming: Edge of bed Upper Body Bathing: Supervision/safety Where Assessed-Upper Body Bathing: Shower Lower Body Bathing: Supervision/safety Where Assessed-Lower Body Bathing: Shower Upper Body Dressing: Setup Where Assessed-Upper Body Dressing: Edge of bed Lower Body Dressing: Minimal assistance Where Assessed-Lower Body Dressing: Edge of bed Toileting: Contact guard Where Assessed-Toileting: Glass blower/designer: Therapist, music Method: Ambulating (with RW) Science writer: Emergency planning/management officer Transfer: Curator Method: Ambulating (with RW) Youth worker: Landscape architect   Exercises:   Other Treatments:     Therapy/Group: Individual Therapy  Tonny Branch 12/24/2020, 6:47 AM

## 2020-12-25 LAB — GLUCOSE, CAPILLARY
Glucose-Capillary: 127 mg/dL — ABNORMAL HIGH (ref 70–99)
Glucose-Capillary: 133 mg/dL — ABNORMAL HIGH (ref 70–99)
Glucose-Capillary: 122 mg/dL — ABNORMAL HIGH (ref 70–99)
Glucose-Capillary: 171 mg/dL — ABNORMAL HIGH (ref 70–99)

## 2020-12-25 MED ORDER — PSYLLIUM 95 % PO PACK
1.0000 | PACK | Freq: Every day | ORAL | Status: DC
  Administered 2020-12-27 – 2021-01-01 (×6): 1 via ORAL
  Filled 2020-12-25 (×8): qty 1

## 2020-12-25 MED ORDER — LOPERAMIDE HCL 2 MG PO CAPS
4.0000 mg | ORAL_CAPSULE | ORAL | Status: DC | PRN
  Administered 2020-12-26: 4 mg via ORAL
  Filled 2020-12-25: qty 2

## 2020-12-25 NOTE — Patient Care Conference (Signed)
Inpatient RehabilitationTeam Conference and Plan of Care Update Date: 12/25/2020   Time: 10:20 AM    Patient Name: Matthew Francis      Medical Record Number: [id number]  Date of Birth: Oct 17, 1960 Sex: Male         Room/Bed: 4W18C/4W18C-01 Payor Info: Payor: [employer]  / Plan: [employer] / Product Type: *No Product type* /    Admit Date/Time:  12/21/2020  3:31 PM  Primary Diagnosis:  ICH (intracerebral hemorrhage) [facility])  Hospital Problems: Principal Problem:   ICH (intracerebral hemorrhage) [facility])    Expected Discharge Date: Expected Discharge Date: 01/01/21  Team Members Present: Physician leading conference: Dr. Alger Simons Care Coodinator Present: Loralee Pacas, LCSWA;Yvonna Brun Creig Hines, RN, BSN, [place] Nurse Present: Dorthula Nettles, RN PT Present: Apolinar Junes, PT OT Present: Laverle Hobby, OT SLP Present: Weston Anna, SLP PPS Coordinator present : Gunnar Fusi, SLP     Current Status/Progress Goal Weekly Team Focus  Bowel/Bladder   continent x2 with complains of diarrhes LBM 12/25/20  pt will have regular bowels  assess qs and prn   Swallow/Nutrition/ Hydration             ADL's   CGA- supervision overall, confused, cofabulation  mod I to supervision  cognitive retraining, ADLs, transfers, safety   Mobility   CGA-supervision overall, Berg 52/56 and 6MWT 1783 ft on 7/11  Supervision overall  Balance, functional mobility, gait and stair training, activity tolerance, strengthening/NMR, patient/caregiver education   Communication             Safety/Cognition/ Behavioral Observations  Mod-Max A  Supervision A  orientstion, recall, mildly complex problem solvng and error awareness   Pain   cComplains of stomach and head  pain 6 out of 10  Pain will reduce to 4  assess qs, prn and treat   Skin   skin intact  skin will remain intact  assess as and prn     Discharge Planning:  D/c to home with intermittent support from aunt and various friends.   Team  Discussion: Working on BP control. Continent B/B, reports back pain, and insomnia. Patient on target to meet rehab goals: yes, had diarrhea this morning. Presents like a TBI. Confused, thought he was in a hotel. Mod I/supervision goals. Clear yesterday with no confusion. Contact guard/ supervision. Mobility going well. Too lethargic for SLP.   *See Care Plan and progress notes for long and short-term goals.   Revisions to Treatment Plan:  Not at this time.  Teaching Needs: Family education, medication management, pain management, bowel management, transfer training, gait training, balance training, endurance training, safety awareness.  Current Barriers to Discharge: Decreased caregiver support, Medical stability, Home enviroment access/layout, Incontinence, Lack of/limited family support, Weight, Medication compliance, and Behavior  Possible Resolutions to Barriers: Continue current medications, provide emotional support.     Medical Summary Current Status: right BG hemorrhage with IV extension, hydrocephalus. impulsive, cognitive deficits  Barriers to Discharge: Behavior;Medical stability   Possible Resolutions to [employer] Focus: daily education re: safety, behavior. lab/pt data assessment   Continued Need for Acute Rehabilitation Level of Care: The patient requires daily medical management by a physician with specialized training in physical medicine and rehabilitation for the following reasons: Direction of a multidisciplinary physical rehabilitation program to maximize functional independence : Yes Medical management of patient stability for increased activity during participation in an intensive rehabilitation regime.: Yes Analysis of laboratory values and/or radiology reports with any subsequent need for medication adjustment and/or medical intervention. :  Yes   I attest that I was present, lead the team conference, and concur with the assessment and plan of the  team.   Cristi Loron 12/25/2020, 3:36 PM

## 2020-12-25 NOTE — Progress Notes (Signed)
Speech Language Pathology Daily Session Note  Patient Details  Name: Matthew Francis MRN: [id number] Date of Birth: June 09, 1961  Today's Date: 12/25/2020 SLP Individual Time: 1100-1115 SLP Individual Time Calculation (min): 15 min and Today's Date: 12/25/2020 SLP Missed Time: 15 Minutes Missed Time Reason: Patient fatigue  Short Term Goals: Week 1: SLP Short Term Goal 1 (Week 1): Pt will recall novel information with 90% accuracy min cues for compensatory strategies SLP Short Term Goal 2 (Week 1): Pt will increase orientation to time and location with 100% accuracy independent for use of visual aids SLP Short Term Goal 3 (Week 1): Pt will increase awareness of errors during structured task with Supervision A cues SLP Short Term Goal 4 (Week 1): Pt will complete mildly complex problem solving tasks with min A SLP Short Term Goal 5 (Week 1): Pt will utilize strateiges as trained to reduce impulsivity during functional tasks with min-mod A SLP Short Term Goal 6 (Week 1): Pt will participate in alternating attention tasks with min-mod A  Skilled Therapeutic Interventions: Skilled treatment session focused on cognitive goals. Upon arrival, patient was asleep in bed and difficult to rouse. SLP attempted to wake patient with verbal and tactile cues as well as environmental modifications with minimal success. Patient would open his eyes for ~5 seconds and then close them again. No verbal responses noted. RN aware. SLP initiated use of external aids for orientation to date and place for staff to use in hopes of minimizing confusion and intermittent agitation. Patient missed remaining 15 minutes of session. Patient left upright in bed with alarm on and all needs within reach. Continue with current plan of care.      Pain No/Denies Pain   Therapy/Group: Individual Therapy  Emmarose Klinke 12/25/2020, 11:48 AM

## 2020-12-25 NOTE — Progress Notes (Signed)
Occupational Therapy Session Note  Patient Details  Name: Matthew Francis MRN: [id number] Date of Birth: 1961/04/07  Today's Date: 12/25/2020 OT Individual Time: 2505-3976 OT Individual Time Calculation (min): 50 min   Session 2: 30 min missed time    Short Term Goals: Week 1:  OT Short Term Goal 1 (Week 1): STGs=LTGs 2/2 ELOS  Skilled Therapeutic Interventions/Progress Updates:    Pt received sitting up in recliner, confused and emotional re current events, thinking he is in a hotel but able to state year/month/day correctly. Gentle reorientation provided and emotional support. Pt began the session very polite, cooperative with therapy, and friendly. As session continued, he became increasingly agitated, uncooperative, and reduced awareness of deficits became more obvious. It seems pt becomes overwhelmed with sensory input- including getting upset when OT provided CGA and placed gaitbelt on him. Despite education and gentle cueing, pt adamantly declined allowing OT to provide CGA or use gaitbelt. He was able to complete transfers around room and toileting tasks with supervision, however OT guarded very close and would have preferred CGA for safety. Pt limited by abdominal and rectal pain from what he describes as an ulcerative collitis flare up. He had diarrhea x2 during session. He requested "calmoseptimine" cream and OT will tell MD during conference. Provided gentle cueing/edu to increase awareness of deficits throughout session. Pt eventually returned to sitting EOB and then supine with mod cueing. He relaxed supine and missed final 10 min of session d/t abdominal pain. Bed alarm set, telesitter on in room.    Session 2: Pt supine and sleeping upon entry. Attempted to awake him for session, he would open eyes briefly and then close them. He would lift his arm for BP reading but would not engage with OT. All VSS. Pt left supine, 30 min missed.    Therapy Documentation Precautions:   Precautions Precautions: Fall Precaution Comments: mild L hemi Restrictions Weight Bearing Restrictions: No   Therapy/Group: Individual Therapy  Curtis Sites 12/25/2020, 6:11 AM

## 2020-12-25 NOTE — Progress Notes (Signed)
Physical Therapy Session Note  Patient Details  Name: Matthew Francis MRN: [id number] Date of Birth: December 05, 1960  Today's Date: 12/26/2020 PT Individual Time: 9150-5697 and 1430-1530  Short Term Goals: Week 1:  PT Short Term Goal 1 (Week 1): STG = LTG d/t ELOS  Skilled Therapeutic Interventions/Progress Updates:     Session 1: Patient in recliner eating lunch upon PT arrival. Patient agreeable to discuss d/c planning and POC while eating. Patient reports that his Aunt could provide 24/7 supervision at d/c and that she could stay with him or he could stay at her home. Reports both homes have 2-4 STE with at least 1 rail and are 1 level homes. Educated patient on cognitive deficits limiting his ability to be independent at d/c for safety, patient stated understanding. Patient asked about returning to work and driving. PT informed him that those decisions would be up to the MD and that he would be receiving follow-up therapy to assist him with reaching these goals. Patient appreciative, but clearly frustrated by this answer. Patient requested to finish eating his lunch before remainder of therapy session.   Session 2: Patient in bed lying on his side upon PT arrival. Patient alert and agreeable to PT session. Patient reported 4/10 rectal pain during session that elevated to 9/10 following BM, RN made aware. PT provided repositioning, rest breaks, and distraction as pain interventions throughout session.   Patient demonstrates decreased tolerance to environmental stimulation, displaying poor frustration and pain tolerance after being in a busy gym. Provided low stim environment in the patient's room with lights off, TV off, and door closed to block out outside noise. Patient also intermittently confused, asking if he was free to walk to his car in the middle of the night or walk "the grounds" on his own. Educated patient about hospital policies for calling for assist before getting up, use of bed and  chair alarms, and remaining on the unit during his stay in inpatient rehab. Patient appreciative of this information, stating "no one ever told me the rules, that could have saved [place] a lot of trouble."  Therapeutic Activity: Bed Mobility: Patient performed supine to/from sit independently.  Transfers: Patient performed sit to/from stand with supervision throughout session. Following NMR in the gym, see below, patient requested to go to the bathroom. Patient performed ambulatory transfer to/from the bathroom and toilet transfer with supervision. Patient stood at the sink after, using a cool washcloth to sooth burning pain following BM. Patient became light-headed in standing and returned to sitting on the bed. Patient asked appropriately for a cream to apply to his rectum to help the burning. LPN made aware. Patient became increasingly frustrated due to the pain and spontaneously safely lowered himself to his knees then into child's pose on a mat on the floor, stating this position feels better. Offered to assist patient into side-lying on the bed, patient refused, becoming more agitated stating "I am staying here, this feels better!" LPN arrived and reporting feeling comfortable helping patient from this position.   Gait Training:  Patient ambulated >150 feet x2 without an AD with close supervision for safety. Ambulated with decreased L foot clearance, decreased R weight shift, and decreased gait speed compared to yesterday. Provided verbal cues for R lateral hip activation for increased stability in R weight shift, increased L hamstring activation in swing, and increased arm swing for improved balance and gait speed.  Neuromuscular Re-ed: Patient performed the Functional Gait Assessment (see details below): Patient demonstrates moderate fall  risk with dynamic gait as noted by score of 21/30 on  Functional Gait Assessment.  (<19=high fall risk with dynamic gait) Educated patient on results and  interpretation following assessment. Patient with onset of dizziness with head turns, will assess vestibular and VOR function next session.   Patient in the room handed off to LPN, as above, at end of session.   Therapy Documentation Precautions:  Precautions Precautions: Fall Precaution Comments: mild L hemi Restrictions Weight Bearing Restrictions: No  Balance: Standardized Balance Assessment Standardized Balance Assessment: Functional Gait Assessment Functional Gait  Assessment Gait Level Surface: Walks 20 ft, slow speed, abnormal gait pattern, evidence for imbalance or deviates 10-15 in outside of the 12 in walkway width. Requires more than 7 sec to ambulate 20 ft. Change in Gait Speed: Able to change speed, demonstrates mild gait deviations, deviates 6-10 in outside of the 12 in walkway width, or no gait deviations, unable to achieve a major change in velocity, or uses a change in velocity, or uses an assistive device. Gait with Horizontal Head Turns: Performs head turns smoothly with slight change in gait velocity (eg, minor disruption to smooth gait path), deviates 6-10 in outside 12 in walkway width, or uses an assistive device. (reports dizziness) Gait with Vertical Head Turns: Performs task with slight change in gait velocity (eg, minor disruption to smooth gait path), deviates 6 - 10 in outside 12 in walkway width or uses assistive device Gait and Pivot Turn: Pivot turns safely in greater than 3 sec and stops with no loss of balance, or pivot turns safely within 3 sec and stops with mild imbalance, requires small steps to catch balance. Step Over Obstacle: Is able to step over 2 stacked shoe boxes taped together (9 in total height) without changing gait speed. No evidence of imbalance. Gait with Narrow Base of Support: Is able to ambulate for 10 steps heel to toe with no staggering. Gait with Eyes Closed: Walks 20 ft, uses assistive device, slower speed, mild gait deviations,  deviates 6-10 in outside 12 in walkway width. Ambulates 20 ft in less than 9 sec but greater than 7 sec. Ambulating Backwards: Walks 20 ft, uses assistive device, slower speed, mild gait deviations, deviates 6-10 in outside 12 in walkway width. Steps: Alternating feet, must use rail. Total Score: 21/30    Therapy/Group: Individual Therapy  Fatuma Dowers L Donna Silverman PT, DPT  12/25/2020, 3:57 PM

## 2020-12-25 NOTE — Progress Notes (Signed)
Inpatient Rehabilitation Care Coordinator Assessment and Plan Patient Details  Name: Matthew Francis MRN: [id number] Date of Birth: Mar 01, 1961  Today's Date: 12/25/2020  Hospital Problems: Principal Problem:   ICH (intracerebral hemorrhage) ([place])  Past Medical History:  Past Medical History:  Diagnosis Date   Arthritis    GERD (gastroesophageal reflux disease)    H/O ulcerative colitis    Hyperlipidemia    Hypertension    Inguinal hernia    Ulcer    ulcerative colitis   Ulcerative colitis    Wears glasses    Past Surgical History:  Past Surgical History:  Procedure Laterality Date   APPENDECTOMY     COLECTOMY     Proctocolectomy with IPAA   COLECTOMY     FOREIGN BODY REMOVAL ABDOMINAL  02/17/2012   Procedure: REMOVAL FOREIGN BODY ABDOMINAL;  Surgeon: Odis Hollingshead, MD;  Location: [place];  Service: General;  Laterality: N/A;  abdominal wound exploration and removal of foreign body   HERNIA REPAIR     Multiple incisional hernias.   ileoanal anastomosis     Social History:  reports that he has never smoked. He has never used smokeless tobacco. He reports current alcohol use of about 4.0 standard drinks of alcohol per week. He reports that he does not use drugs.  Family / Support Systems Marital Status: Single Spouse/Significant Other: N/A Children: 1 dtr- Heather Other Supports: Kathryne Sharper and friend-PRN Anticipated Caregiver: aunt [name] Ability/Limitations of Caregiver: Patient's aunt works and only able to provide PRN assistance. Pt reports that he has friends that can assit Caregiver Availability: Intermittent Family Dynamics: Pt lives alone.  Social History Preferred language: English Religion: Methodist Cultural Background: Pt reports he was working for a [employer] until this stroke. Education: some college Read: Yes Write: Yes Employment Status: Unemployed Date Retired/Disabled/Unemployed: recent Public relations account executive Issues: Pt  admits that he served 5 yrs in jail over 10 years ago. Guardian/Conservator: N/A   Abuse/Neglect Abuse/Neglect Assessment Can Be Completed: Yes Physical Abuse: Denies Verbal Abuse: Denies Sexual Abuse: Denies Exploitation of patient/patient's resources: Denies Self-Neglect: Denies  Emotional Status Pt's affect, behavior and adjustment status: Pt in good spirits at time of visit. Pt had some cognitive deficits in which he was not able to remember things Recent Psychosocial Issues: Denies Psychiatric History: Denies Substance Abuse History: Pt denies ever soming cigarettes. Pt admits to drinking 1-2 beers per week. Pt admits to cocaine use off/on for 8 years. Last use was 2 weeks ago (at time of admission)  Patient / Family Perceptions, Expectations & Goals Pt/Family understanding of illness & functional limitations: Pt has a general understanding of his care needs Premorbid pt/family roles/activities: Independent PTA Anticipated changes in roles/activities/participation: Assistance with ADLs/IADLs  Community Resources Express Scripts: None Premorbid Home Care/DME Agencies: None Transportation available at discharge: Family Resource referrals recommended: Neuropsychology  Discharge Planning Living Arrangements: Alone Support Systems: Other relatives Type of Residence: Private residence Insurance Resources: Multimedia programmer (specify) ([place]) Financial Resources: Employment, Family Support Financial Screen Referred: No Living Expenses: Pensions consultant (Dad owns the home and he pays him rent) Money Management: Patient Does the patient have any problems obtaining your medications?: No Home Management: Pt managed all homecare needs Patient/Family Preliminary Plans: TBD Care Coordinator Barriers to Discharge: Decreased caregiver support, Lack of/limited family support, Other (comments) Care Coordinator Barriers to Discharge Comments: Insurance for [place] Coordinator Anticipated  Follow Up Needs: HH/OP Expected length of stay: 7-10 days  Clinical Impression SW met with pt  in room to introduce self, explain role, and discuss discharge process. 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 DME: Rws, and quad canes. Pt aware SW to f/u with his aunt [name] to discuss discharge plan.  [place] left message for [name] [phone]) to introduce self and discuss discharge plan. SW waiting on follow-up.   Coulson Wehner A Lithzy Bernard 12/25/2020, 9:59 AM

## 2020-12-25 NOTE — Progress Notes (Addendum)
PROGRESS NOTE   Subjective/Complaints: Crohn's disease acted up yesterday into the night.    Objective:   No results found. Recent Labs    12/24/20 0939  WBC 5.9  HGB 13.1  HCT 40.6  PLT 204   Recent Labs    12/24/20 0939  NA 133*  K 4.2  CL 104  CO2 18*  GLUCOSE 146*  BUN 16  CREATININE 1.48*  CALCIUM 10.3    Intake/Output Summary (Last 24 hours) at 12/25/2020 1146 Last data filed at 12/25/2020 0725 Gross per 24 hour  Intake 478 ml  Output --  Net 478 ml        Physical Exam: Vital Signs Blood pressure 133/79, pulse (!) 107, temperature 98.6 F (37 C), resp. rate 18, height 5' 11"  (1.803 m), weight 90 kg, SpO2 98 %. Constitutional: No distress . Vital signs reviewed. HEENT: EOMI, oral membranes moist Neck: supple Cardiovascular: RRR without murmur. No JVD    Respiratory/Chest: CTA Bilaterally without wheezes or rales. Normal effort    GI/Abdomen: BS +, non-tender, non-distended Ext: no clubbing, cyanosis, or edema Psych: pleasant and cooperative, a little impulsive Neurological:    Mental Status: He is alert.    Comments: alert and oriented to person, place, reason. He is tangential to circumferential. Normal speech, language. STM deficits.  Moves all 4 limbs. Mild left PD and decreased FMC/apraxia LUE and LLE ongoing. Senses pain and LT in all 4's. No focal CN findings.  good sitting balance     Assessment/Plan: 1. Functional deficits which require 3+ hours per day of interdisciplinary therapy in a comprehensive inpatient rehab setting. Physiatrist is providing close team supervision and 24 hour management of active medical problems listed below. Physiatrist and rehab team continue to assess barriers to discharge/monitor patient progress toward functional and medical goals  Care Tool:  Bathing    Body parts bathed by patient: Right arm, Left arm, Chest, Abdomen, Front perineal area, Buttocks,  Right upper leg, Left upper leg, Right lower leg, Face   Body parts bathed by helper: Left lower leg     Bathing assist Assist Level: Minimal Assistance - Patient > 75%     Upper Body Dressing/Undressing Upper body dressing   What is the patient wearing?: Pull over shirt    Upper body assist Assist Level: Minimal Assistance - Patient > 75%    Lower Body Dressing/Undressing Lower body dressing      What is the patient wearing?: Underwear/pull up     Lower body assist Assist for lower body dressing: Minimal Assistance - Patient > 75%     Toileting Toileting    Toileting assist Assist for toileting: Supervision/Verbal cueing     Transfers Chair/bed transfer  Transfers assist     Chair/bed transfer assist level: Independent     Locomotion Ambulation   Ambulation assist      Assist level: Contact Guard/Touching assist Assistive device: No Device Max distance: 1783 ft   Walk 10 feet activity   Assist     Assist level: Supervision/Verbal cueing Assistive device: No Device   Walk 50 feet activity   Assist    Assist level: Contact Guard/Touching assist Assistive device:  No Device    Walk 150 feet activity   Assist    Assist level: Contact Guard/Touching assist Assistive device: No Device    Walk 10 feet on uneven surface  activity   Assist Walk 10 feet on uneven surfaces activity did not occur: Safety/medical concerns         Wheelchair     Assist Will patient use wheelchair at discharge?: No   Wheelchair activity did not occur: N/A         Wheelchair 50 feet with 2 turns activity    Assist    Wheelchair 50 feet with 2 turns activity did not occur: N/A       Wheelchair 150 feet activity     Assist  Wheelchair 150 feet activity did not occur: N/A       Blood pressure 133/79, pulse (!) 107, temperature 98.6 F (37 C), resp. rate 18, height 5' 11"  (1.803 m), weight 90 kg, SpO2 98 %.    Medical Problem  List and Plan: 1.  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 with PT, OT, SLP  -Continue CIR therapies including PT, OT, and SLP  2.  Antithrombotics: -DVT/anticoagulation: SCDs             -antiplatelet therapy: N/A 3. Pain Management: Robaxin as needed, Tylenol as needed 4. Mood: Provide emotional support             -antipsychotic agents: Risperdal 1 mg twice daily for agitated behavior   -add prn seroquel for sleep (received 1x dose this weekend) 5. Neuropsych: This patient is not capable of making decisions on his own behalf. 6. Skin/Wound Care: Routine skin checks 7. Fluids/Electrolytes/Nutrition: double portions  I personally reviewed the patient's labs today.    7/12 -mild hyponatremia--f/u tomorrow given today's diarrhea  -AST sl elevated. Likely reactive, LFT's decreased in general 8.  Hypertension.  Clonidine 01 mg daily, Norvasc 10 mg daily, hydralazine 100 mg twice daily.  Monitor with increased mobility             -BP under control at present 9.  Ulcerative colitis.Xifaxan 550 mg twice daily, Entocort 9 mg daily             -simethicone for gas/bloating  7/12 add fiber and prn metamucil for loose stool   -recheck labs tomorrow 10.  GNR found 1 blood culture 12/16/2020.  Placed on empiric Rocephin 12/18/2020             -cx + diptheroids---dc abx, spoke with pharmacy 11.  AKI.  Cr 1.51---> 1.48 7/11 12.  Type 2 diabetes mellitus.  Hemoglobin A1c 5.9.  SSI. CBGs 113-190. Cr 1.51-   -consider resuming metformin if needed and Cr normal  CBG (last 3)  Recent Labs    12/24/20 2137 12/25/20 0657 12/25/20 1142  GLUCAP 115* 127* 133*    13. Insomnia: grounds pass for time outside. Continue melatonin 70m HS 14. Hypokalemia: resolved   .    LOS: 4 days A FACE TO FACE EVALUATION WAS  PERFORMED  ZMeredith Staggers7/05/2021, 11:46 AM

## 2020-12-25 NOTE — Progress Notes (Signed)
Patient had mul6tiple bowels/ diarrhea last night. Requires prn antidiarrhea

## 2020-12-25 NOTE — Progress Notes (Signed)
Patient ID: Matthew Francis, male   DOB: 23-May-1961, 60 y.o.   MRN: [id number]  SW went by pt room to provide updates from team conference, and d/c date 7/19 but pt was sleeping. SW will continue to make efforts.   SW  spoke with pt aunt Matthew Francis [phone]) to provide updates from team conference and pt d/c date. She confirms this is the best number to contact. When discussing d/c plan, she reported pt will not d/c to home, and will not be alone for awhile. SW discussed family education. States she will speak with other family to discuss if they are able to come in. Through discussion, it is not concrete who will be staying with him at this time. She also wanted to speak with a physician with regard to her concerns with his colitis, continued headaches he reports to her, cognition and test results. SW relayed her concerns to attending asking if they can follow-up with her.   Loralee Pacas, MSW, [place] Office: [phone] Cell: [phone] Fax: [phone]

## 2020-12-26 LAB — BASIC METABOLIC PANEL
Anion gap: 11 (ref 5–15)
BUN: 13 mg/dL (ref 6–20)
CO2: 22 mmol/L (ref 22–32)
Calcium: 10.5 mg/dL — ABNORMAL HIGH (ref 8.9–10.3)
Chloride: 104 mmol/L (ref 98–111)
Creatinine, Ser: 1.27 mg/dL — ABNORMAL HIGH (ref 0.61–1.24)
GFR, Estimated: >60 mL/min (ref >60)
Glucose, Bld: 120 mg/dL — ABNORMAL HIGH (ref 70–99)
Potassium: 4 mmol/L (ref 3.5–5.1)
Sodium: 137 mmol/L (ref 135–145)

## 2020-12-26 LAB — GLUCOSE, CAPILLARY
Glucose-Capillary: 122 mg/dL — ABNORMAL HIGH (ref 70–99)
Glucose-Capillary: 134 mg/dL — ABNORMAL HIGH (ref 70–99)
Glucose-Capillary: 155 mg/dL — ABNORMAL HIGH (ref 70–99)
Glucose-Capillary: 143 mg/dL — ABNORMAL HIGH (ref 70–99)

## 2020-12-26 MED ORDER — PANTOPRAZOLE SODIUM 40 MG PO TBEC
40.0000 mg | DELAYED_RELEASE_TABLET | Freq: Two times a day (BID) | ORAL | Status: DC
  Administered 2020-12-26 – 2021-01-01 (×12): 40 mg via ORAL
  Filled 2020-12-26 (×12): qty 1

## 2020-12-26 NOTE — Progress Notes (Signed)
Speech Language Pathology Daily Session Note  Patient Details  Name: Matthew Francis MRN: [id number] Date of Birth: 04/23/1961  Today's Date: 12/26/2020 SLP Individual Time: 1000-1045 SLP Individual Time Calculation (min): 45 min  Short Term Goals: Week 1: SLP Short Term Goal 1 (Week 1): Pt will recall novel information with 90% accuracy min cues for compensatory strategies SLP Short Term Goal 2 (Week 1): Pt will increase orientation to time and location with 100% accuracy independent for use of visual aids SLP Short Term Goal 3 (Week 1): Pt will increase awareness of errors during structured task with Supervision A cues SLP Short Term Goal 4 (Week 1): Pt will complete mildly complex problem solving tasks with min A SLP Short Term Goal 5 (Week 1): Pt will utilize strateiges as trained to reduce impulsivity during functional tasks with min-mod A SLP Short Term Goal 6 (Week 1): Pt will participate in alternating attention tasks with min-mod A  Skilled Therapeutic Interventions: Skilled treatment session focused on cognitive goals. Upon arrival, patient was calm and cooperative. 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 Patient demonstrated significant difficulty navigating his phone in order to find information. Patient did not demonstrate any frustration but did demonstrate improved insight in regards to the impact of the stroke on his memory and cognitive skills. With extra time, patient able to find information. Patient also reported frequent disorientation in regards to time of day and SLP provided strategies to utilize environmental cues as well as his phone vs the clock that is too far to see on the wall. Patient appeared lethargic at end of session and SLP educated patient regarding mental fatigue after a stroke. Patient verbalized understanding and agreement. Patient left in bed with alarm on and all needs within reach.  Continue with current plan of care.      Pain No/Denies Pain   Therapy/Group: Individual Therapy  Sharanya Templin 12/26/2020, 12:57 PM

## 2020-12-26 NOTE — Progress Notes (Signed)
Patient ID: Matthew Francis, male   DOB: [birth year], 60 y.o.   MRN: [id number]  SW made attempt again to see pt to provide updates from team conference, and d/c date 7/19 but pt was sleeping. SW will continue to make efforts.  Loralee Pacas, MSW, [place] Office: [phone] Cell: [phone] Fax: [phone]

## 2020-12-26 NOTE — Progress Notes (Signed)
Occupational Therapy Session Note  Patient Details  Name: Matthew Francis MRN: [id number] Date of Birth: [birth year]  Today's Date: 12/26/2020 OT Individual Time: 6962-9528 OT Individual Time Calculation (min): 40 min    Today's Date: 12/26/2020 OT Individual Time: 4132-4401 OT Individual Time Calculation (min): 45 min   Short Term Goals: Week 1:  OT Short Term Goal 1 (Week 1): STGs=LTGs 2/2 ELOS  Skilled Therapeutic Interventions/Progress Updates:     Pt received in bed with 0 out of 10 pain. Pt reporting feeling very tired and not wanting to do tx. Pt agreeable to changing shirt/grooming routine  ADL:  Pt completes UB dressing with set up to change long sleeve shirt. Pt completes LB dressing with supervision at sit to stand level with no AD Pt completes footwear with set up to change non skid socks Pt completes toileting with CS for clothing management and hygeine with supervision seated. VC for flushing toilet. Pt completes toileting transfer with close supervision for ambulation into/out of bathroom x3 during session d/t colitis/crohns flare?  Pt left at end of session in bed with exit alarm on, call light in reach and all needs met  Session 2: Pt received in bathroom with LPN present handing off pt to OT. No pain reported Pt completes toileting at beginning and end of session with supervision and increased time to void. Pt completes ambulation to all tx destinations with S overall and no LOB. Pt demo POOR pathfinding skills and working memory throughtout session needing to turn back to room to go get shoes but pt states, "ive never come from this direction" when walking past hallway to go to room. Pt also unable to find way back tx gym when ambulating to RN station to get a soda mid way through session. Pt sits at [facility] to complete 4 level visual scanning/alternating attention and sustained attention test passing 3/4 levels (needing multiple attempts at all 4 levels). But demo  cognitive and physcial fatigue during 4th level. Educated pt on implicaitons. Pt unable to follow signs to room turning in opposite direction despite MAX visual cuing using external aides in hallways/walls. Pt left at end of session in bathroom with LPN in room. Therapy Documentation Precautions:  Precautions Precautions: Fall Precaution Comments: mild L hemi Restrictions Weight Bearing Restrictions: No General:   Vital Signs:   Pain:   ADL: ADL Eating: Set up Where Assessed-Eating: Chair Grooming: Setup Where Assessed-Grooming: Edge of bed Upper Body Bathing: Supervision/safety Where Assessed-Upper Body Bathing: Shower Lower Body Bathing: Supervision/safety Where Assessed-Lower Body Bathing: Shower Upper Body Dressing: Setup Where Assessed-Upper Body Dressing: Edge of bed Lower Body Dressing: Minimal assistance Where Assessed-Lower Body Dressing: Edge of bed Toileting: Contact guard Where Assessed-Toileting: Glass blower/designer: Therapist, music Method: Ambulating (with RW) Science writer: Emergency planning/management officer Transfer: Curator Method: Ambulating (with RW) Youth worker: Landscape architect   Exercises:   Other Treatments:     Therapy/Group: Individual Therapy  Tonny Branch 12/26/2020, 6:52 AM

## 2020-12-26 NOTE — Progress Notes (Signed)
Physical Therapy Session Note  Patient Details  Name: Matthew Francis MRN: [id number] Date of Birth: 03-01-1961  Today's Date: 12/26/2020 PT Individual Time: 9093-1121 PT Individual Time Calculation (min): 45 min   Short Term Goals: Week 1:  PT Short Term Goal 1 (Week 1): STG = LTG d/t ELOS  Skilled Therapeutic Interventions/Progress Updates:     Patient in bed upon PT arrival. Patient alert and reported 9/10 headache with throbbing pain during session, LPN made aware and provided medications during session. PT provided repositioning, rest breaks, and distraction as pain interventions throughout session.   Vitals:  BP 132/85, HR 120, SPO2 100%, LPN provided HR meds during session with reassess vitals after therapy session.  Limited mobility due to throbbing HA, elevated HR, and patient reporting dizziness with ambulation to/from the bathroom.   Therapeutic Activity: Bed Mobility: Patient performed supine to/from sit with independently.  Transfers: Patient performed sit to/from stand from the bed and toilet with CGA due to increased  postural sway with mobility today. Provided verbal cues for forward weight shift and performance of gaze stabilization in standing x1 with some resolution of symptoms of dizziness. Patient performed ambulatory transfers to/from the bathroom with CGA and min A x1 due to increased unsteadiness during turn in to the bathroom. Patient was continent of bowl and performed peri-care and application of barrier cream with set-up A to independently. Performed hand hygiene independently with CGA for standing balance for safety. Patient returned to bed and symptoms of dizziness resolved <30 sec in lying.   Focused remainder of session with education on daily vitals assessment, normal values for vitals, taking medications prior to mobility to manage HR. Also discussed activities the patient enjoys to determine goals for activities to return to. 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 During education PT replaced his broke eye glasses ear piece with one from our reserve of non-prescription glasses to improve patient's attention and reduce frustration during therapy sessions.     Patient in bed requesting to rest due to headache at end of session with breaks locked, bed alarm set, and all needs within reach. Patient missed 15 min of skilled PT due to pain, RN made aware. Will attempt to make-up missed time as able.    Therapy Documentation Precautions:  Precautions Precautions: Fall Precaution Comments: mild L hemi Restrictions Weight Bearing Restrictions: No   Therapy/Group: Individual Therapy  Karmello Abercrombie L Shelma Eiben PT, DPT  12/26/2020, 7:47 PM

## 2020-12-26 NOTE — Progress Notes (Signed)
PROGRESS NOTE   Subjective/Complaints: Feels that stools are "acidic" and that he needs his protonix. Stools have slowed however and denies any bloating. Headaches seem to be getting better also.   ROS: Patient denies fever, rash, sore throat, blurred vision, nausea, vomiting,  cough, shortness of breath or chest pain, joint or back pain,  or mood change.    Objective:   No results found. Recent Labs    12/24/20 0939  WBC 5.9  HGB 13.1  HCT 40.6  PLT 204   Recent Labs    12/24/20 0939 12/26/20 0623  NA 133* 137  K 4.2 4.0  CL 104 104  CO2 18* 22  GLUCOSE 146* 120*  BUN 16 13  CREATININE 1.48* 1.27*  CALCIUM 10.3 10.5*    Intake/Output Summary (Last 24 hours) at 12/26/2020 0848 Last data filed at 12/25/2020 1832 Gross per 24 hour  Intake 236 ml  Output --  Net 236 ml        Physical Exam: Vital Signs Blood pressure 117/73, pulse (!) 121, temperature 98.7 F (37.1 C), resp. rate 18, height 5' 11"  (1.803 m), weight 90 kg, SpO2 100 %. Constitutional: No distress . Vital signs reviewed. HEENT: EOMI, oral membranes moist Neck: supple Cardiovascular: RRR without murmur. No JVD    Respiratory/Chest: CTA Bilaterally without wheezes or rales. Normal effort    GI/Abdomen: BS +, non-tender, non-distended Ext: no clubbing, cyanosis, or edema Psych: pleasant and cooperative, less impulsive Neurological:    Mental Status: He is alert.    Comments: more on point, improving insight. Still with STM deficits. Normal speech, language. STM deficits.  Moves all 4 limbs. Mild left PD and decreased FMC/apraxia LUE and LLE ongoing. Senses pain and LT in all 4's. No focal CN findings.     Assessment/Plan: 1. Functional deficits which require 3+ hours per day of interdisciplinary therapy in a comprehensive inpatient rehab setting. Physiatrist is providing close team supervision and 24 hour management of active medical problems  listed below. Physiatrist and rehab team continue to assess barriers to discharge/monitor patient progress toward functional and medical goals  Care Tool:  Bathing    Body parts bathed by patient: Right arm, Left arm, Chest, Abdomen, Front perineal area, Buttocks, Right upper leg, Left upper leg, Right lower leg, Face   Body parts bathed by helper: Left lower leg     Bathing assist Assist Level: Minimal Assistance - Patient > 75%     Upper Body Dressing/Undressing Upper body dressing   What is the patient wearing?: Pull over shirt    Upper body assist Assist Level: Set up assist    Lower Body Dressing/Undressing Lower body dressing      What is the patient wearing?: Underwear/pull up     Lower body assist Assist for lower body dressing: Minimal Assistance - Patient > 75%     Toileting Toileting    Toileting assist Assist for toileting: Supervision/Verbal cueing     Transfers Chair/bed transfer  Transfers assist     Chair/bed transfer assist level: Independent     Locomotion Ambulation   Ambulation assist      Assist level: Contact Guard/Touching assist Assistive device:  No Device Max distance: 1783 ft   Walk 10 feet activity   Assist     Assist level: Supervision/Verbal cueing Assistive device: No Device   Walk 50 feet activity   Assist    Assist level: Contact Guard/Touching assist Assistive device: No Device    Walk 150 feet activity   Assist    Assist level: Contact Guard/Touching assist Assistive device: No Device    Walk 10 feet on uneven surface  activity   Assist Walk 10 feet on uneven surfaces activity did not occur: Safety/medical concerns         Wheelchair     Assist Will patient use wheelchair at discharge?: No   Wheelchair activity did not occur: N/A         Wheelchair 50 feet with 2 turns activity    Assist    Wheelchair 50 feet with 2 turns activity did not occur: N/A        Wheelchair 150 feet activity     Assist  Wheelchair 150 feet activity did not occur: N/A       Blood pressure 117/73, pulse (!) 121, temperature 98.7 F (37.1 C), resp. rate 18, height 5' 11"  (1.803 m), weight 90 kg, SpO2 100 %.    Medical Problem List and Plan: 1.  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 with PT, OT, SLP  -Continue CIR therapies including PT, OT, and SLP   2.  Antithrombotics: -DVT/anticoagulation: SCDs             -antiplatelet therapy: N/A 3. Pain Management: Robaxin as needed, Tylenol as needed  -talked about headaches which he feels are improving. Will stay with current meds for now.  4. Mood: Provide emotional support             -antipsychotic agents: Risperdal 1 mg twice daily for agitated behavior   -add prn seroquel for sleep (received 1x dose this weekend) 5. Neuropsych: This patient is not capable of making decisions on his own behalf. 6. Skin/Wound Care: Routine skin checks 7. Fluids/Electrolytes/Nutrition: double portions  I personally reviewed the patient's labs today.    7/12 -mild hyponatremia--improved 7/13  -AST sl elevated. Likely reactive, LFT's decreased in general 8.  Hypertension.  Clonidine 01 mg daily, Norvasc 10 mg daily, hydralazine 100 mg twice daily.  Monitor with increased mobility             -BP under control at present 9.  Ulcerative colitis.Xifaxan 550 mg twice daily, Entocort 9 mg daily             -simethicone for gas/bloating  - added fiber and prn metamucil for loose stool  7/13 stools improved. Pt wants protonix (which he's already on)---will increase to bid for "acid" 10.  GNR found 1 blood culture 12/16/2020.  Placed on empiric Rocephin 12/18/2020             -cx + diptheroids---dc abx, spoke with pharmacy 11.  AKI.  Cr 1.51---> 1.48  7/11 12.  Type 2 diabetes mellitus.  Hemoglobin A1c 5.9.  SSI. CBGs 113-190. Cr 1.51-   -consider resuming metformin if needed and Cr normal  CBG (last 3)  Recent Labs    12/25/20 1709 12/25/20 2126  12/26/20 0634  GLUCAP 122* 171* 122*    13. Insomnia: grounds pass for time outside. Continue melatonin 26m HS 14. Hypokalemia: resolved   .    LOS: 5 days A FACE TO FACE EVALUATION WAS PERFORMED  ZMeredith Staggers7/13/2022, 8:48 AM

## 2020-12-27 LAB — GLUCOSE, CAPILLARY
Glucose-Capillary: 129 mg/dL — ABNORMAL HIGH (ref 70–99)
Glucose-Capillary: 128 mg/dL — ABNORMAL HIGH (ref 70–99)
Glucose-Capillary: 113 mg/dL — ABNORMAL HIGH (ref 70–99)
Glucose-Capillary: 133 mg/dL — ABNORMAL HIGH (ref 70–99)

## 2020-12-27 LAB — VITAMIN B1: Vitamin B1 (Thiamine): 162.4 nmol/L (ref 66.5–200.0)

## 2020-12-27 NOTE — Progress Notes (Signed)
Physical Therapy Session Note  Patient Details  Name: Matthew Francis MRN: [id number] Date of Birth: [birth year]  Today's Date: 12/27/2020 PT Individual Time: 1315-1415 PT Individual Time Calculation (min): 60 min   Short Term Goals: Week 1:  PT Short Term Goal 1 (Week 1): STG = LTG d/t ELOS  Skilled Therapeutic Interventions/Progress Updates:     Patient in bed upon PT arrival. Patient alert and agreeable to PT session. Patient denied pain during session. Patient asked to "not work hard," during session. Upon further questioning, patient reported depressive symptoms, denied the desire to hurt himself or others, but felt isolated, confused, and "in a dream" or "in a loop." Patient also reports visual and audible hallucinations, reports speaking to this therapist and his chaplain yesterday evening, though these events did not occur. Offered patient to work on path finding and community integration during session by going outside, also for improved mood and patient affect. Patient in agreement.   Therapeutic Activity: Bed Mobility: Patient performed supine to/from sit independently.  Transfers: Patient performed sit to/from stand with supervision throughout session. Provided verbal cues for spatial awareness with sitting at outdoor table for safety.  Community Integration:  Patient ambulated >1000 feet x2 to/from the courtyard outside the atrium without an AD with supervision. Provided patient with visual locators and clear directions to the courtyard and asked patient to navigate back to his room following time outside. Patient required mod-max cues x3 to navigate back to his room, passing his room on the first try. Noted patient to utilize minimal visual scanning and required cues for looking R/L to locate visual locators provided earlier. Patient stopped on the way and performed toileting in a public bathroom with supervision. Provided privacy as patient had a BM during toileting with door closed,  but unlocked and held by therapist for safety. Patient performed hand hygiene independently and PT wiped down toilet and sink due to contact precautions.   Patient as at a picnic table in the shade with PT. Patient expressed concern about being "detained" here and that he was not aware of the "charges" brought against him. Patient disoriented to place and situation once outside. PT reoriented patient and the patient began to recall events leading up to the stroke and events that occurred in the hospital, including the use of wrist restraints and prolonged length of stay. 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 Patient appreciative of explanation. Patient without recall of similar topics being discussed in previous sessions. Patient then discussed hx of cocaine use and desire to find alternative coping strategies. Educated on use of mindfulness, hobbies, new activities, and building a support system and joining a support group to keep him accountable. Patient engaged and attentive throughout discussion despite various external distractions outside.   Patient in on the toilet handed off to NT at end of session.   Therapy Documentation Precautions:  Precautions Precautions: Fall Precaution Comments: mild L hemi Restrictions Weight Bearing Restrictions: No    Therapy/Group: Individual Therapy  Matthew Francis L Caylon Saine PT, DPT  12/27/2020, 5:17 PM

## 2020-12-27 NOTE — Progress Notes (Signed)
Occupational Therapy Session Note  Patient Details  Name: Matthew Francis MRN: [id number] Date of Birth: [birth year]  Today's Date: 12/27/2020 OT Individual Time: 1115-1200 OT Individual Time Calculation (min): 45 min    Short Term Goals: Week 1:  OT Short Term Goal 1 (Week 1): STGs=LTGs 2/2 ELOS  Skilled Therapeutic Interventions/Progress Updates:    Pt received in room sleeping in bed and consented to OT tx. Pt required extensive time to wake up, reports he is sleepy but that he slept good last night. Pt gets up with SUP, gathers all his items and insists on bringing snacks, drink, books, and phone with him to therapy. Pt walked down to therapy gym with SUP, once he was there he seemed confused and agitated, says, "I'm just trying to gain some perspective." Explained to pt that he was in therapy, reminded him again of who I was, that we had worked together in the past and I helped him shower on eval. Pt just laughs and says, "yeah okay if you say so." Pt instructed in 4# dowel rod exercises for increased strength and activity tolerance for ADLs req min cuing for proper reps and technique. Pt took frequent breaks to eat snacks during exercises. Pt completed chest press, upright row, shoulder flexion, and shoulder press all for 3x15. Pt then instructed in standing balance activity, tossing horseshoes into basket and then going back to pick them up off the ground. Pt had no LOB and completed task with no device and SUP. Pt then walked back to room and toileted in bathroom with SUP, washed hands standing sink side, and left sitting up in recliner with seatbelt applied, chair alarm on, and all needs met.   Therapy Documentation Precautions:  Precautions Precautions: Fall Precaution Comments: mild L hemi Restrictions Weight Bearing Restrictions: No Vital Signs: Therapy Vitals Temp: 97.9 F (36.6 C) Temp Source: Oral Pulse Rate: 98 Resp: 16 BP: 109/71 Patient Position (if appropriate):  Lying Oxygen Therapy SpO2: 97 % Pain: none      Therapy/Group: Individual Therapy  Maley Venezia 12/27/2020, 7:50 AM

## 2020-12-27 NOTE — Progress Notes (Addendum)
Patient ID: Matthew Francis, male   DOB: [birth year], 60 y.o.   MRN: [id number]  SW met with pt and pt aunt [name] in room to review discharge and d/c date. She reported she will speak with her husband about staying with pt during the day. Pt states he will speak with a few friends about this as well. SW stressed to pt the importance of him having 24/7 care at this time. He is reluctant to accept, but appears to understand support is required. Pt aunt would like him to have home therapies if possible. SW informed it will be challenging to get [facility] with his insurance, and also due to his current progression with therapies it will likely not be recommended. Her primary concern is  transportation to/from outpatient. SW informed will f/u once there is more information with regard to his d/c recs. Pt also to f/u with SW to inform on people who can assist him while he is at home.   SW completed [place] application and faxed referral (p:[phone]/f:[phone]); email: [email or web address].   Loralee Pacas, MSW, [place] Office: [phone] Cell: [phone] Fax: [phone]

## 2020-12-27 NOTE — Progress Notes (Signed)
PROGRESS NOTE   Subjective/Complaints: Feeling tired still but overall headaches are decreasing and his bowels have settled down. Up with OT working on [employer]. I spoke with aunt who was here today as well.   ROS: Patient denies fever, rash, sore throat, blurred vision, nausea, vomiting, diarrhea, cough, shortness of breath or chest pain, joint or back pain,  or mood change.    Objective:   No results found. No results for input(s): WBC, HGB, HCT, PLT in the last 72 hours.  Recent Labs    12/26/20 0623  NA 137  K 4.0  CL 104  CO2 22  GLUCOSE 120*  BUN 13  CREATININE 1.27*  CALCIUM 10.5*    Intake/Output Summary (Last 24 hours) at 12/27/2020 1052 Last data filed at 12/27/2020 0838 Gross per 24 hour  Intake 716 ml  Output --  Net 716 ml        Physical Exam: Vital Signs Blood pressure 109/71, pulse 98, temperature 97.9 F (36.6 C), temperature source Oral, resp. rate 16, height 5' 11"  (1.803 m), weight 90 kg, SpO2 97 %. Constitutional: No distress . Vital signs reviewed. HEENT: EOMI, oral membranes moist Neck: supple Cardiovascular: RRR without murmur. No JVD    Respiratory/Chest: CTA Bilaterally without wheezes or rales. Normal effort    GI/Abdomen: BS +, non-tender, non-distended Ext: no clubbing, cyanosis, or edema Psych: pleasant and cooperative,, a little impulsive  Skin: intact Neurological:    Mental Status: He is alert.    Comments: more focused and redirectable. Still with STM deficits. Normal speech, language.   Moves all 4 limbs. Mild left PD and decreased FMC/apraxia LUE and LLE ongoing. Senses pain and LT in all 4's. No focal CN findings.     Assessment/Plan: 1. Functional deficits which require 3+ hours per day of interdisciplinary therapy in a comprehensive inpatient rehab setting. Physiatrist is providing close team supervision and 24 hour management of active medical problems  listed below. Physiatrist and rehab team continue to assess barriers to discharge/monitor patient progress toward functional and medical goals  Care Tool:  Bathing    Body parts bathed by patient: Right arm, Left arm, Chest, Abdomen, Front perineal area, Buttocks, Right upper leg, Left upper leg, Right lower leg, Face   Body parts bathed by helper: Left lower leg     Bathing assist Assist Level: Minimal Assistance - Patient > 75%     Upper Body Dressing/Undressing Upper body dressing   What is the patient wearing?: Pull over shirt    Upper body assist Assist Level: Set up assist    Lower Body Dressing/Undressing Lower body dressing      What is the patient wearing?: Underwear/pull up     Lower body assist Assist for lower body dressing: Minimal Assistance - Patient > 75%     Toileting Toileting    Toileting assist Assist for toileting: Supervision/Verbal cueing     Transfers Chair/bed transfer  Transfers assist     Chair/bed transfer assist level: Independent     Locomotion Ambulation   Ambulation assist      Assist level: Contact Guard/Touching assist Assistive device: No Device Max distance: 1783 ft   Walk  10 feet activity   Assist     Assist level: Supervision/Verbal cueing Assistive device: No Device   Walk 50 feet activity   Assist    Assist level: Contact Guard/Touching assist Assistive device: No Device    Walk 150 feet activity   Assist    Assist level: Contact Guard/Touching assist Assistive device: No Device    Walk 10 feet on uneven surface  activity   Assist Walk 10 feet on uneven surfaces activity did not occur: Safety/medical concerns         Wheelchair     Assist Will patient use wheelchair at discharge?: No   Wheelchair activity did not occur: N/A         Wheelchair 50 feet with 2 turns activity    Assist    Wheelchair 50 feet with 2 turns activity did not occur: N/A        Wheelchair 150 feet activity     Assist  Wheelchair 150 feet activity did not occur: N/A       Blood pressure 109/71, pulse 98, temperature 97.9 F (36.6 C), temperature source Oral, resp. rate 16, height 5' 11"  (1.803 m), weight 90 kg, SpO2 97 %.    Medical Problem List and Plan: 1.  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 with PT, OT, SLP  -Continue CIR therapies including PT, OT, and SLP    2.  Antithrombotics: -DVT/anticoagulation: SCDs             -antiplatelet therapy: N/A 3. Pain Management: Robaxin as needed, Tylenol as needed  -pt told me headaches are gradually improving  -continue low dose topamax 4. Mood: Provide emotional support             -antipsychotic agents: Risperdal 1 mg twice daily for agitated behavior   -add edprn seroquel for sleep  5. Neuropsych: This patient is not capable of making decisions on his own behalf. 6. Skin/Wound Care: Routine skin checks 7. Fluids/Electrolytes/Nutrition: double portions  I personally reviewed the patient's labs today.    7/12 -mild hyponatremia--improved 7/13  -AST sl elevated. Likely reactive, LFT's decreased in general 8.  Hypertension.  Clonidine 01 mg daily, Norvasc 10 mg daily, hydralazine 100 mg twice daily.  Monitor with increased mobility             -BP under control at present 7/14 9.  Ulcerative colitis.Xifaxan 550 mg twice daily, Entocort 9 mg daily             -simethicone for gas/bloating  - added fiber and prn metamucil for loose stool  7/13 -14stools improved. I increased his protonix to bid for "acid" 10.  GNR found 1 blood culture 12/16/2020.  Placed on empiric Rocephin 12/18/2020             -cx + diptheroids---dc abx, spoke with pharmacy 11.  AKI.  Cr 1.51---> 1.48 7/11 12.  Type 2 diabetes mellitus.  Hemoglobin A1c 5.9.   SSI. CBGs 113-190. Cr 1.51-   -consider resuming metformin if needed and Cr normal  CBG (last 3)  Recent Labs    12/26/20 1654 12/26/20 2145 12/27/20 0655  GLUCAP 155* 143* 129*    13. Insomnia: Continue melatonin 6m HS, prn  seroquel 14. Hypokalemia: resolved   .    LOS: 6 days A FACE TO FACE EVALUATION WAS PERFORMED  Meredith Staggers 12/27/2020, 10:52 AM

## 2020-12-27 NOTE — Progress Notes (Signed)
Occupational Therapy Session Note  Patient Details  Name: Matthew Francis MRN: [id number] Date of Birth: 13-Oct-1960  Today's Date: 12/27/2020 OT Individual Time: 4967-5916 OT Individual Time Calculation (min): 75 min    Short Term Goals: Week 1:  OT Short Term Goal 1 (Week 1): STGs=LTGs 2/2 ELOS  Skilled Therapeutic Interventions/Progress Updates:     Pt received in bed with 4 out of 10 pain in head. RN alerted to provid tylenol when avaible provided for pain relief  ADL:  Pt completes bathing with supervision and VC for seated bathing of BLE Pt completes UB dressing with supervision standing. Pt unable to find where he put his shirt d/t poor self organization and working memory Pt completes LB dressing with VC for seated threading of BLE Pt completes footwear with VC for initiating donning shoes after donning socks Pt completes shower/Tub transfer with supervision Overall pts biggest limiting factor is self organizaiton  Therapeutic activity Pt HR elevated after longer bouts of mobility up to 128 visibly looking fatigued. Rest provided PRN and HR decreases to 90s.  Focus on path finding back to room with OT starting close to room and slowly getting farther from room. Despite 4 trips to/from room, each time upon return to room, pt unable to locate room on R. Major observations include pt does not visual scan when walking unless cued, significant difficulty using external aides to follow back to room, poor spatial awareness in hallway.   Pt left at end of session in bed with exit alarm on, call light in reach and all needs met   Therapy Documentation Precautions:  Precautions Precautions: Fall Precaution Comments: mild L hemi Restrictions Weight Bearing Restrictions: No General:   Vital Signs: Therapy Vitals Temp: 97.9 F (36.6 C) Temp Source: Oral Pulse Rate: 98 Resp: 16 BP: 109/71 Patient Position (if appropriate): Lying Oxygen Therapy SpO2: 97 % Pain:    ADL: ADL Eating: Set up Where Assessed-Eating: Chair Grooming: Setup Where Assessed-Grooming: Edge of bed Upper Body Bathing: Supervision/safety Where Assessed-Upper Body Bathing: Shower Lower Body Bathing: Supervision/safety Where Assessed-Lower Body Bathing: Shower Upper Body Dressing: Setup Where Assessed-Upper Body Dressing: Edge of bed Lower Body Dressing: Minimal assistance Where Assessed-Lower Body Dressing: Edge of bed Toileting: Contact guard Where Assessed-Toileting: Glass blower/designer: Therapist, music Method: Ambulating (with RW) Science writer: Emergency planning/management officer Transfer: Curator Method: Ambulating (with RW) Youth worker: Landscape architect   Exercises:   Other Treatments:     Therapy/Group: Individual Therapy  Tonny Branch 12/27/2020, 6:47 AM

## 2020-12-27 NOTE — Progress Notes (Signed)
Speech Language Pathology Daily Session Note  Patient Details  Name: Matthew Francis MRN: [id number] Date of Birth: 1960-07-23  Today's Date: 12/27/2020  Session 1: SLP Individual Time: 1791-5056 SLP Individual Time Calculation (min): 50 min  Session 2: SLP Individual Time: 9794-8016 SLP Individual Time Calculation (min): 15 min  Short Term Goals: Week 1: SLP Short Term Goal 1 (Week 1): Pt will recall novel information with 90% accuracy min cues for compensatory strategies SLP Short Term Goal 2 (Week 1): Pt will increase orientation to time and location with 100% accuracy independent for use of visual aids SLP Short Term Goal 3 (Week 1): Pt will increase awareness of errors during structured task with Supervision A cues SLP Short Term Goal 4 (Week 1): Pt will complete mildly complex problem solving tasks with min A SLP Short Term Goal 5 (Week 1): Pt will utilize strateiges as trained to reduce impulsivity during functional tasks with min-mod A SLP Short Term Goal 6 (Week 1): Pt will participate in alternating attention tasks with min-mod A  Skilled Therapeutic Interventions:   Session 1: Skilled treatment session focused on cognitive goals. Patient was awake, alert and cooperative. Patient able to introduce family members and direct where to locate specific items to take home. Patient was continent of bowel and agreeable to assistance from SLP. Patient ambulated to and from the SLP office with Mod I but required Min verbal cues for path finding. SLP facilitated session by providing supervision level verbal cues for functional problem solving during basic money and medication management tasks. Patient left upright in bed with alarm on and all needs within reach. Continue with current plan of care.   Session 2: Skilled treatment session focused on cognitive goals. Patient calling out from his room and attempting to get out of his chair unassisted. Patient appeared confused and requesting to  return to the "main building." SLP provided education and assurance that he was in the correct location. Patient then requested to use the bathroom and ambulated with contact guard, suspect due to fatigue. Patient was continent of bladder. Patient requested to return to bed and left with alarm on and all needs within reach. Continue with current plan of care.      Pain No/Denies Pain   Therapy/Group: Individual Therapy  Katiya Fike 12/27/2020, 3:37 PM

## 2020-12-28 LAB — GLUCOSE, CAPILLARY
Glucose-Capillary: 115 mg/dL — ABNORMAL HIGH (ref 70–99)
Glucose-Capillary: 143 mg/dL — ABNORMAL HIGH (ref 70–99)
Glucose-Capillary: 167 mg/dL — ABNORMAL HIGH (ref 70–99)
Glucose-Capillary: 129 mg/dL — ABNORMAL HIGH (ref 70–99)

## 2020-12-28 LAB — VITAMIN D 25 HYDROXY (VIT D DEFICIENCY, FRACTURES): Vit D, 25-Hydroxy: 64.15 ng/mL (ref 30–100)

## 2020-12-28 MED ORDER — RISPERIDONE 0.5 MG PO TABS
0.5000 mg | ORAL_TABLET | Freq: Two times a day (BID) | ORAL | Status: DC
  Administered 2020-12-28 – 2020-12-31 (×6): 0.5 mg via ORAL
  Filled 2020-12-28 (×6): qty 1

## 2020-12-28 MED ORDER — TOPIRAMATE 25 MG PO TABS
25.0000 mg | ORAL_TABLET | Freq: Every day | ORAL | Status: DC
  Administered 2020-12-29 – 2020-12-31 (×3): 25 mg via ORAL
  Filled 2020-12-28 (×3): qty 1

## 2020-12-28 NOTE — Progress Notes (Addendum)
Occupational Therapy Progress Note  Patient Details  Name: Matthew Francis MRN: [id number] Date of Birth: 27-Jul-1960  Today's Date: 12/28/2020 OT Individual Time: 8242-3536 OT Individual Time Calculation (min): 55 min    OT Short Term Goals Week 1:  OT Short Term Goal 1 (Week 1): STGs=LTGs 2/2 ELOS  Skilled Therapeutic Interventions/Progress Updates:    Pt initially in bed reporting pain this morning but not stating where. Pt requesting 15 min "to gather myself" prior to getting started with tx. When OT arrives back 15 min later pt agreeable to tx.  ADL:  Pt completes UB dressing with set up Pt completes LB dressing with CGA as pt insistent on standing for threading BLE. Pt with minor LOB and states, "ah, it just takes one time and that could have been bad." Continued reinforcement thatpt should sit for LB dressing Pt completes footwear with VC for tying shoes as pt donned shoes and walked around room for 2 min with laces flopping on floor Pt completes grooming standing with increased time to gather needed items as pt walking into bathroom 2x without grabbing washcloth  Therapeutic activity Focus of session on visual scanning while mobilizing d/t impairments with visual perception/poor working memory/dual tasks  Bounce ball and walk and name animals in alphabetical order for dual task. Unable to complete all 3 wihtotu stopping each time to think of animal. Took out bouncing ball and pt walks SIGNIFICANTLY slower trying to name animals than average walking speed requiring mod cuing to think of animals and keep track of first half of alphabet. Pt getting frustrated and OT allowed seated rest and provided education on novel dual task to improve/challenge cognitive processing. Pt verbalized understanding and finished second half of task with decreased cuing compared to first half.  Walk hall scanning for odd number 1-11 and even number 12-2 in descending order requiring ambulation for 12 min to  locate numbers in order with supervision for forced visual scanning and awareness of memory deficits. Cueing required for slower pace walking and scanning in B directions during task.    Pt left at end of session in  bed with exit alarm on, call light in reach and all needs met   Therapy Documentation Precautions:  Precautions Precautions: Fall Precaution Comments: mild L hemi Restrictions Weight Bearing Restrictions: No General:   Vital Signs: Therapy Vitals Temp: 98.5 F (36.9 C) Pulse Rate: 95 Resp: 14 BP: 108/70 Oxygen Therapy SpO2: 100 % O2 Device: Room Air Pain:   ADL: ADL Eating: Set up Where Assessed-Eating: Chair Grooming: Setup Where Assessed-Grooming: Edge of bed Upper Body Bathing: Supervision/safety Where Assessed-Upper Body Bathing: Shower Lower Body Bathing: Supervision/safety Where Assessed-Lower Body Bathing: Shower Upper Body Dressing: Setup Where Assessed-Upper Body Dressing: Edge of bed Lower Body Dressing: Minimal assistance Where Assessed-Lower Body Dressing: Edge of bed Toileting: Contact guard Where Assessed-Toileting: Glass blower/designer: Therapist, music Method: Ambulating (with RW) Science writer: Emergency planning/management officer Transfer: Curator Method: Ambulating (with RW) Youth worker: Landscape architect   Exercises:   Other Treatments:     Therapy/Group: Individual Therapy  Tonny Branch 12/28/2020, 6:47 AM

## 2020-12-28 NOTE — Progress Notes (Signed)
Speech Language Pathology Weekly Progress and Session Note  Patient Details  Name: Matthew Francis MRN: [id number] Date of Birth: [birth year]  Beginning of progress report period: December 21, 2020 End of progress report period: December 28, 2020  Today's Date: 12/28/2020 SLP Individual Time: 1320-1340 SLP Individual Time Calculation (min): 20 min and Today's Date: 12/28/2020 SLP Missed Time: 25 Minutes Missed Time Reason: Patient fatigue  Short Term Goals: Week 1: SLP Short Term Goal 1 (Week 1): Pt will recall novel information with 90% accuracy min cues for compensatory strategies SLP Short Term Goal 1 - Progress (Week 1): Not met SLP Short Term Goal 2 (Week 1): Pt will increase orientation to time and location with 100% accuracy independent for use of visual aids SLP Short Term Goal 2 - Progress (Week 1): Not met SLP Short Term Goal 3 (Week 1): Pt will increase awareness of errors during structured task with Supervision A cues SLP Short Term Goal 3 - Progress (Week 1): Not met SLP Short Term Goal 4 (Week 1): Pt will complete mildly complex problem solving tasks with min A SLP Short Term Goal 4 - Progress (Week 1): Not met SLP Short Term Goal 5 (Week 1): Pt will utilize strateiges as trained to reduce impulsivity during functional tasks with min-mod A SLP Short Term Goal 5 - Progress (Week 1): Not met SLP Short Term Goal 6 (Week 1): Pt will participate in alternating attention tasks with min-mod A SLP Short Term Goal 6 - Progress (Week 1): Not met    New Short Term Goals: Week 2: SLP Short Term Goal 1 (Week 2): STGs=LTGs due to ELOS  Weekly Progress Updates: Patient has made minimal and inconsistent gains and has not met any STGs this reporting period. Currently, patient demonstrates fluctuating level of cognitive functioning but overall requires Mod-Max A multimodal cues to complete functional and mildly complex tasks safely in regards to problem solving, recall with use of strategies, attention  and awareness of errors. Patient's overall progress and participation can be limited by fatigue at times as well as intermittent confusion. Patient and family education ongoing. Patient's LTGs have been modified to reflect limited improvements in cognitive functioning. Patient would benefit from skilled SLP intervention to maximize his cognitive functioning and overall functional independence prior to discharge.      Intensity: Minumum of 1-2 x/day, 30 to 90 minutes Frequency: 3 to 5 out of 7 days Duration/Length of Stay: 01/01/21 Treatment/Interventions: Cognitive remediation/compensation;Internal/external aids;Therapeutic Activities;Therapeutic Exercise;Patient/family education;Functional tasks;Cueing hierarchy;Environmental controls   Daily Session  Skilled Therapeutic Interventions:  SLP attempted to see patient X 2 earlier attempts with minimal success due to fatigue. RN aware. On the last attempt, patient was eventually able to rouse and wanted to eat his lunch. Patient educated on upcoming group session in 10 minutes and reported, "I guess I will be late then." Patient demonstrated selective attention to self-feeding in a mildly distracting environment with supervision verbal cues for redirection. Patient donned his shoes independently and requested to use the bathroom. Patient was continent of bladder. Patient then proceeded to brush his teeth after washing his hands despite reminder of time constraints. Patient performed all basic self care tasks independently. Patient then ambulated to the dayroom and left with OT. Continue with current plan of care.      Pain No/Denies Pain   Therapy/Group: Individual Therapy  Denard Tuminello 12/28/2020, 6:07 AM

## 2020-12-28 NOTE — Progress Notes (Signed)
Physical Therapy Note  Patient Details  Name: Matthew Francis MRN: [id number] Date of Birth: 28-Aug-1960 Today's Date: 12/28/2020    Patient in bed asleep upon PT arrival. Patient aroused after multiple verbal and tactile stimuli. Patient reported increased fatigue following OT session this morning. Patient unable to maintain arousal during discussion and asked to have PT at a later time due to fatigue. Patient missed 45 min of skilled PT due to fatigue, RN made aware. Will attempt to make-up missed time as able. Per OT, patient performed many higher level gait and dual task activities during session.      Raynette Arras L Leila Schuff PT, DPT  12/28/2020, 10:19 AM

## 2020-12-28 NOTE — Progress Notes (Signed)
Occupational Therapy Session Note  Patient Details  Name: Matthew Francis MRN: [id number] Date of Birth: Feb 22, 1961  Today's Date: 12/28/2020 OT Individual Time:  -     and Today's Date: 12/28/2020 OT Missed Time:   Missed Time Reason:     Short Term Goals: Week 1:  OT Short Term Goal 1 (Week 1): STGs=LTGs 2/2 ELOS  Skilled Therapeutic Interventions/Progress Updates:    Pt participated in rhythmic drumming group. No pain during session Focus of group on BUE coordination, strengthening, endurance, timing/control, activity tolerance, and social participation and engagement. Pt performs session from seated position for energy conservation. Skilled interventions included slower timing of movements and graded cuing to assist with recall of movement patterns. Warm up performed prior to exercises and UB stretching completed at end of group with demo from OT. Pt able to select preferred song to share with group. Returned pt to room at end of session. Exited session with pt seated in bed, exit alarm on and call light in reach   Therapy Documentation Precautions:  Precautions Precautions: Fall Precaution Comments: mild L hemi Restrictions Weight Bearing Restrictions: No General: General PT Missed Treatment Reason: Patient fatigue Vital Signs:   Pain:   ADL: ADL Eating: Set up Where Assessed-Eating: Chair Grooming: Setup Where Assessed-Grooming: Edge of bed Upper Body Bathing: Supervision/safety Where Assessed-Upper Body Bathing: Shower Lower Body Bathing: Supervision/safety Where Assessed-Lower Body Bathing: Shower Upper Body Dressing: Setup Where Assessed-Upper Body Dressing: Edge of bed Lower Body Dressing: Minimal assistance Where Assessed-Lower Body Dressing: Edge of bed Toileting: Contact guard Where Assessed-Toileting: Glass blower/designer: Therapist, music Method: Ambulating (with RW) Science writer: Energy manager: Agricultural engineer Method: Ambulating (with RW) Youth worker: Systems analyst    Praxis   Exercises:   Other Treatments:     Therapy/Group: Group Therapy  Tonny Branch 12/28/2020, 1:00 PM

## 2020-12-28 NOTE — Plan of Care (Signed)
  Problem: RH Cognition - SLP Goal: RH LTG Patient will demonstrate orientation with cues Description:  LTG:  Patient will demonstrate orientation to person/place/time/situation with cues (SLP)   Flowsheets (Taken 12/28/2020 0604) LTG Patient will demonstrate orientation to:  Place  Time  Situation LTG: Patient will demonstrate orientation using cueing (SLP): Minimal Assistance - Patient > 75% Note: Goal downgraded due to inconsistent progress    Problem: RH Problem Solving Goal: LTG Patient will demonstrate problem solving for (SLP) Description: LTG:  Patient will demonstrate problem solving for basic/complex daily situations with cues  (SLP) Flowsheets (Taken 12/28/2020 0604) LTG Patient will demonstrate problem solving for: Minimal Assistance - Patient > 75% Note: Goal downgraded due to inconsistent progress    Problem: RH Memory Goal: LTG Patient will demonstrate ability for day to day (SLP) Description: LTG:   Patient will demonstrate ability for day to day recall/carryover during cognitive/linguistic activities with assist  (SLP) Flowsheets (Taken 12/28/2020 0604) LTG: Patient will demonstrate ability for day to day recall: New information LTG: Patient will demonstrate ability for day to day recall/carryover during cognitive/linguistic activities with assist (SLP): Minimal Assistance - Patient > 75% Note: Goal downgraded due to inconsistent progress    Problem: RH Attention Goal: LTG Patient will demonstrate this level of attention during functional activites (SLP) Description: LTG:  Patient will will demonstrate this level of attention during functional activites (SLP) Flowsheets (Taken 12/28/2020 0604) Patient will demonstrate during cognitive/linguistic activities the attention type of: Selective LTG: Patient will demonstrate this level of attention during cognitive/linguistic activities with assistance of (SLP): Minimal Assistance - Patient > 75% Number of minutes patient will  demonstrate attention during cognitive/linguistic activities: 30 Note: Goal downgraded due to inconsistent progress    Problem: RH Awareness Goal: LTG: Patient will demonstrate awareness during functional activites type of (SLP) Description: LTG: Patient will demonstrate awareness during functional activites type of (SLP) Flowsheets (Taken 12/28/2020 0604) LTG: Patient will demonstrate awareness during cognitive/linguistic activities with assistance of (SLP): Minimal Assistance - Patient > 75% Note: Goal downgraded due to inconsistent progress

## 2020-12-28 NOTE — Progress Notes (Addendum)
PROGRESS NOTE   Subjective/Complaints: Still c/o fatigue. Struggled with second session of therapy today. Headaches are getting better  ROS: Patient denies fever, rash, sore throat, blurred vision, nausea, vomiting, diarrhea, cough, shortness of breath or chest pain, joint or back pain,   or mood change.    Objective:   No results found. No results for input(s): WBC, HGB, HCT, PLT in the last 72 hours.  Recent Labs    12/26/20 0623  NA 137  K 4.0  CL 104  CO2 22  GLUCOSE 120*  BUN 13  CREATININE 1.27*  CALCIUM 10.5*    Intake/Output Summary (Last 24 hours) at 12/28/2020 1110 Last data filed at 12/28/2020 0841 Gross per 24 hour  Intake 520 ml  Output 225 ml  Net 295 ml        Physical Exam: Vital Signs Blood pressure 108/70, pulse 95, temperature 98.5 F (36.9 C), resp. rate 14, height 5' 11"  (1.803 m), weight 90 kg, SpO2 100 %. Constitutional: No distress . Vital signs reviewed. HEENT: EOMI, oral membranes moist Neck: supple Cardiovascular: RRR without murmur. No JVD    Respiratory/Chest: CTA Bilaterally without wheezes or rales. Normal effort    GI/Abdomen: BS +, non-tender, non-distended Ext: no clubbing, cyanosis, or edema Psych: pleasant and cooperative, more quiet today Skin: intact Neurological:    Mental Status: He is alert.    Comments: more redirectable, seems a little fatigued. Still with STM deficits. Normal speech, language.   Moves all 4 limbs. Mild left PD and decreased FMC/apraxia LUE and LLE ongoing. Senses pain and LT in all 4's. No focal CN findings.     Assessment/Plan: 1. Functional deficits which require 3+ hours per day of interdisciplinary therapy in a comprehensive inpatient rehab setting. Physiatrist is providing close team supervision and 24 hour management of active medical problems listed below. Physiatrist and rehab team continue to assess barriers to discharge/monitor patient  progress toward functional and medical goals  Care Tool:  Bathing    Body parts bathed by patient: Right arm, Left arm, Chest, Abdomen, Front perineal area, Buttocks, Right upper leg, Left upper leg, Right lower leg, Face, Left lower leg   Body parts bathed by helper: Left lower leg     Bathing assist Assist Level: Supervision/Verbal cueing     Upper Body Dressing/Undressing Upper body dressing   What is the patient wearing?: Pull over shirt    Upper body assist Assist Level: Supervision/Verbal cueing    Lower Body Dressing/Undressing Lower body dressing      What is the patient wearing?: Pants, Underwear/pull up     Lower body assist Assist for lower body dressing: Supervision/Verbal cueing     Toileting Toileting    Toileting assist Assist for toileting: Set up assist     Transfers Chair/bed transfer  Transfers assist     Chair/bed transfer assist level: Independent     Locomotion Ambulation   Ambulation assist      Assist level: Contact Guard/Touching assist Assistive device: No Device Max distance: 1783 ft   Walk 10 feet activity   Assist     Assist level: Supervision/Verbal cueing Assistive device: No Device   Walk  50 feet activity   Assist    Assist level: Contact Guard/Touching assist Assistive device: No Device    Walk 150 feet activity   Assist    Assist level: Contact Guard/Touching assist Assistive device: No Device    Walk 10 feet on uneven surface  activity   Assist Walk 10 feet on uneven surfaces activity did not occur: Safety/medical concerns         Wheelchair     Assist Will patient use wheelchair at discharge?: No   Wheelchair activity did not occur: N/A         Wheelchair 50 feet with 2 turns activity    Assist    Wheelchair 50 feet with 2 turns activity did not occur: N/A       Wheelchair 150 feet activity     Assist  Wheelchair 150 feet activity did not occur: N/A        Blood pressure 108/70, pulse 95, temperature 98.5 F (36.9 C), resp. rate 14, height 5' 11"  (1.803 m), weight 90 kg, SpO2 100 %.    Medical Problem List and Plan: 1.  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 with PT, OT, SLP  -Continue CIR therapies including PT, OT, and SLP     2.  Antithrombotics: -DVT/anticoagulation: SCDs             -antiplatelet therapy: N/A 3. Pain Management: Robaxin as needed, Tylenol as needed  -pt told me headaches are gradually improving  -continue low dose topamax but change to hs given daytime fatigue 4. Mood: Provide emotional support             -antipsychotic agents: Risperdal 1 mg twice daily for agitated behavior   -reduce to 0.63m bid given fatigue   -prn seroquel for sleep  5. Neuropsych: This patient is not capable of making decisions on his own behalf. 6. Skin/Wound Care: Routine skin checks 7. Fluids/Electrolytes/Nutrition: double portions  I personally reviewed the patient's labs today.    7/12 -mild hyponatremia--improved 7/13  -AST sl elevated. Likely reactive, LFT's decreased in general  -recheck labs Monday 8.  Hypertension.  Clonidine 01 mg daily, Norvasc 10 mg daily, hydralazine 100 mg twice daily.  Monitor with increased mobility             -BP under control at present 7/15 9.  Ulcerative colitis.Xifaxan 550 mg twice daily, Entocort 9 mg daily             -simethicone for gas/bloating  - added fiber and prn metamucil for loose stool  -stools improved. I increased his protonix to bid for "acid" 10.  GNR found 1 blood culture 12/16/2020.  Placed on empiric Rocephin 12/18/2020             -cx + diptheroids---dc abx, spoke with pharmacy 11.  AKI/?CKD.  Cr 1.51---> 1.48 7/11 12.  Type 2 diabetes mellitus.  Hemoglobin A1c 5.9.  SSI. CBGs 113-190. Cr 1.51-    -consider resuming metformin if needed and Cr normal  CBG (last 3) --controlled 7/15 Recent Labs    12/27/20 1653 12/27/20 2304 12/28/20 0603  GLUCAP 113* 133* 115*    13. Fatigue: -check vitamin D level today---*addendum: level wnl - thyroid levels  not too valuable at this stage of recovery from [place] -reduce risperdal, adjust topamax to night -he is sleeping better. -monitor for now  .    LOS: 7 days A FACE TO FACE EVALUATION WAS PERFORMED  Meredith Staggers 12/28/2020, 11:10 AM

## 2020-12-28 NOTE — Progress Notes (Signed)
Patient requesting something for nasal congestion at night.

## 2020-12-28 NOTE — Progress Notes (Signed)
Patient ID: Matthew Francis, male   DOB: Dec 25, 1960, 60 y.o.   MRN: [id number]  SW left message for pt aunt [name]  [phone]) to inform on outpatient therapies recommended and no DME. SW stressed the importance of 24/7 care due to confusion. SW asked her to f/u about outpatient referral being sent to [facility]. SW informed on application submitted for [facility].  Loralee Pacas, MSW, [place] Office: [phone] Cell: [phone] Fax: [phone]

## 2020-12-29 LAB — GLUCOSE, CAPILLARY
Glucose-Capillary: 126 mg/dL — ABNORMAL HIGH (ref 70–99)
Glucose-Capillary: 136 mg/dL — ABNORMAL HIGH (ref 70–99)
Glucose-Capillary: 99 mg/dL (ref 70–99)
Glucose-Capillary: 125 mg/dL — ABNORMAL HIGH (ref 70–99)

## 2020-12-29 MED ORDER — FLUTICASONE PROPIONATE 50 MCG/ACT NA SUSP
2.0000 | Freq: Every day | NASAL | Status: DC
  Administered 2020-12-29 – 2020-12-31 (×3): 2 via NASAL
  Filled 2020-12-29: qty 16

## 2020-12-30 LAB — GLUCOSE, CAPILLARY
Glucose-Capillary: 133 mg/dL — ABNORMAL HIGH (ref 70–99)
Glucose-Capillary: 117 mg/dL — ABNORMAL HIGH (ref 70–99)
Glucose-Capillary: 185 mg/dL — ABNORMAL HIGH (ref 70–99)
Glucose-Capillary: 109 mg/dL — ABNORMAL HIGH (ref 70–99)

## 2020-12-30 NOTE — Progress Notes (Signed)
Speech Language Pathology Daily Session Note  Patient Details  Name: Matthew Francis MRN: [id number] Date of Birth: May 02, 1961  Today's Date: 12/30/2020 SLP Individual Time: 6979-4801 SLP Individual Time Calculation (min): 45 min  Short Term Goals: Week 2: SLP Short Term Goal 1 (Week 2): STGs=LTGs due to ELOS  Skilled Therapeutic Interventions:  Pt was seen for skilled ST targeting cognitive goals.  Pt was sleeping upon therapist's arrival but awakened easily to voice and was agreeable to participating in treatment.  Pt put his socks on, walked to the bathroom, washed his hands, and brushed his teeth with supervision and was noted to move between tasks quickly but no other overtly unsafe behaviors noted.  During functional conversations with therapist, pt needed min assist verbal cues to identify ways in which he will need increased assistance in the home environment post discharge.  During these conversations he at times appeared fixated on returning to work due to what appeared to be decreased anticipatory awareness of his current limitations.  However, he was easily redirected to conversations related to household ADLs which will likely be of more immediate concern at discharge.  Pt also believes that he will be able to return to driving upon discharge given his improved mobility and SLP informed pt that he will likely need to discuss returning to driving with his MD.  Pt was left in bed with bed alarm set and call bell within reach.  Continue per current plan of care.    Pain Pain Assessment Pain Scale: 0-10 Pain Score: 0-No pain  Therapy/Group: Individual Therapy  Deletha Jaffee, Selinda [name] 12/30/2020, 12:19 PM

## 2020-12-30 NOTE — Progress Notes (Signed)
PROGRESS NOTE   Subjective/Complaints:  Pt reports Flonase form yesterday was "miracle"- stuffy nose completely resolved.  Had good BM o/n but a little "too firm"-  Asking what can be done for that- since doesn't want medication- we discussed nursing to bring prune juice.    ROS:  Pt denies SOB, abd pain, CP, N/V/C/D, and vision changes  Objective:   No results found. No results for input(s): WBC, HGB, HCT, PLT in the last 72 hours.  No results for input(s): NA, K, CL, CO2, GLUCOSE, BUN, CREATININE, CALCIUM in the last 72 hours.   Intake/Output Summary (Last 24 hours) at 12/30/2020 1349 Last data filed at 12/30/2020 0729 Gross per 24 hour  Intake 240 ml  Output --  Net 240 ml        Physical Exam: Vital Signs Blood pressure 119/84, pulse (!) 104, temperature 98.6 F (37 C), resp. rate 18, height 5' 11"  (1.803 m), weight 90 kg, SpO2 100 %.    General: awake, alert, appropriate, standing with nursing to put on pants- they were too big; NAD HENT: conjugate gaze; oropharynx moist- nose not stuffy sounding anymore CV: regular rhythm; mildly tachycardic; rate; no JVD Pulmonary: CTA B/L; no W/R/R- good air movement GI: soft, NT, ND, (+)BS Psychiatric: appropriate- interactive Neurological: alert  Ext: no clubbing, cyanosis, or edema Psych: pleasant and cooperative, more quiet today Skin: intact Neurological:    Mental Status: He is alert.    Comments: more redirectable, seems a little fatigued. Still with STM deficits. Normal speech, language.   Moves all 4 limbs. Mild left PD and decreased FMC/apraxia LUE and LLE ongoing. Senses pain and LT in all 4's. No focal CN findings.     Assessment/Plan: 1. Functional deficits which require 3+ hours per day of interdisciplinary therapy in a comprehensive inpatient rehab setting. Physiatrist is providing close team supervision and 24 hour management of active medical  problems listed below. Physiatrist and rehab team continue to assess barriers to discharge/monitor patient progress toward functional and medical goals  Care Tool:  Bathing    Body parts bathed by patient: Right arm, Left arm, Chest, Abdomen, Front perineal area, Buttocks, Right upper leg, Left upper leg, Right lower leg, Face, Left lower leg   Body parts bathed by helper: Left lower leg     Bathing assist Assist Level: Supervision/Verbal cueing     Upper Body Dressing/Undressing Upper body dressing   What is the patient wearing?: Pull over shirt    Upper body assist Assist Level: Supervision/Verbal cueing    Lower Body Dressing/Undressing Lower body dressing      What is the patient wearing?: Pants, Underwear/pull up     Lower body assist Assist for lower body dressing: Supervision/Verbal cueing     Toileting Toileting    Toileting assist Assist for toileting: Set up assist     Transfers Chair/bed transfer  Transfers assist     Chair/bed transfer assist level: Independent     Locomotion Ambulation   Ambulation assist      Assist level: Contact Guard/Touching assist Assistive device: No Device Max distance: 1783 ft   Walk 10 feet activity   Assist  Assist level: Supervision/Verbal cueing Assistive device: No Device   Walk 50 feet activity   Assist    Assist level: Contact Guard/Touching assist Assistive device: No Device    Walk 150 feet activity   Assist    Assist level: Contact Guard/Touching assist Assistive device: No Device    Walk 10 feet on uneven surface  activity   Assist Walk 10 feet on uneven surfaces activity did not occur: Safety/medical concerns         Wheelchair     Assist Will patient use wheelchair at discharge?: No   Wheelchair activity did not occur: N/A         Wheelchair 50 feet with 2 turns activity    Assist    Wheelchair 50 feet with 2 turns activity did not occur: N/A        Wheelchair 150 feet activity     Assist  Wheelchair 150 feet activity did not occur: N/A       Blood pressure 119/84, pulse (!) 104, temperature 98.6 F (37 C), resp. rate 18, height 5' 11"  (1.803 m), weight 90 kg, SpO2 100 %.    Medical Problem List and Plan: 1.  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 with PT, OT, SLP  -Continue CIR therapies including PT, OT, and SLP     2.  Antithrombotics: -DVT/anticoagulation: SCDs             -antiplatelet therapy: N/A 3. Pain Management: Robaxin as needed, Tylenol as needed  -pt told me headaches are gradually improving  -continue low dose topamax but change to hs given daytime fatigue  7/17- no c/o HA's- con't regimen 4. Mood: Provide emotional support             -antipsychotic agents: Risperdal 1 mg twice daily for agitated behavior   -reduce to 0.61m bid given fatigue   -prn seroquel for sleep   7/17- very appropriate- per nursing, hasn't been agitated- con't regimen 5. Neuropsych: This patient is not capable of making decisions on his own behalf. 6. Skin/Wound Care: Routine skin checks 7. Fluids/Electrolytes/Nutrition: double portions  I personally reviewed the patient's labs today.    7/12 -mild hyponatremia--improved 7/13  -AST sl elevated. Likely reactive, LFT's decreased in general  -recheck labs Monday 8.  Hypertension.  Clonidine 01 mg daily, Norvasc 10 mg daily, hydralazine 100 mg twice daily.  Monitor with increased mobility             -BP under control at present 7/15 9.  Ulcerative colitis.Xifaxan 550 mg twice daily, Entocort 9 mg daily             -simethicone for gas/bloating  - added fiber and prn metamucil for loose stool  -stools improved. I increased his protonix to bid for "acid" 10.  GNR found 1 blood culture 12/16/2020.  Placed  on empiric Rocephin 12/18/2020             -cx + diptheroids---dc abx, spoke with pharmacy 11.  AKI/?CKD.  Cr 1.51---> 1.48 7/11 12.  Type 2 diabetes mellitus.  Hemoglobin A1c 5.9.  SSI. CBGs 113-190. Cr 1.51-   -consider resuming metformin if needed and Cr normal  CBG (last 3) --controlled 7/15 Recent  Labs    12/29/20 2050 12/30/20 0559 12/30/20 1149  GLUCAP 125* 133* 117*    7/17- BG's well controlled- con't regimen 13. Fatigue: -check vitamin D level today---*addendum: level wnl - thyroid levels not too valuable at this stage of recovery from [facility] -reduce risperdal, adjust topamax to night -he is sleeping better. -monitor for now 14. Nasal congestion  7/17- started Flonase for Sx's- feels much better   .    LOS: 9 days A FACE TO FACE EVALUATION WAS PERFORMED  Dalilah Curlin 12/30/2020, 1:49 PM

## 2020-12-30 NOTE — Discharge Summary (Signed)
Physician Discharge Summary  Patient ID: Matthew Francis MRN: [id number] DOB/AGE: 1960/06/27 60 y.o.  Admit date: 12/21/2020 Discharge date: 01/01/2021  Discharge Diagnoses:  Principal Problem:   ICH (intracerebral hemorrhage) ([place]) Pain management Hypertension Ulcerative colitis Mood stabilization GNR found 1 blood culture 12/16/2020 AKI/CKD Diabetes mellitus Obesity Hyperlipidemia Polysubstance abuse  Discharged Condition: Stable  Significant Diagnostic Studies: CT HEAD WO CONTRAST  Result Date: 12/17/2020 CLINICAL DATA:  Intracranial hemorrhage follow EXAM: CT HEAD WITHOUT CONTRAST TECHNIQUE: Contiguous axial images were obtained from the base of the skull through the vertex without intravenous contrast. COMPARISON:  12/15/2020 FINDINGS: Brain: Unchanged intraparenchymal hemorrhage in the right caudate head extending into the ventricular system. Unchanged mild communicating hydrocephalus. No new site of hemorrhage. Vascular: No hyperdense vessel or unexpected calcification. Skull: Normal. Negative for fracture or focal lesion. Sinuses/Orbits: No acute finding. Other: None IMPRESSION: 1. Unchanged intraparenchymal hemorrhage in the right caudate head extending into the ventricular system. 2. Unchanged mild communicating hydrocephalus. Electronically Signed   By: Ulyses Jarred M.D.   On: 12/17/2020 01:18   CT HEAD WO CONTRAST  Result Date: 12/15/2020 CLINICAL DATA:  Intracranial hemorrhage follow-up EXAM: CT HEAD WITHOUT CONTRAST TECHNIQUE: Contiguous axial images were obtained from the base of the skull through the vertex without intravenous contrast. COMPARISON:  12/14/2020 FINDINGS: Brain: Persistent intraventricular extension of hemorrhage from the right caudate head hematoma. Hydrocephalus has improved. No new site of hemorrhage. Vascular: No abnormal hyperdensity of the major intracranial arteries or dural venous sinuses. No intracranial atherosclerosis. Skull: The visualized skull base,  calvarium and extracranial soft tissues are normal. Sinuses/Orbits: No fluid levels or advanced mucosal thickening of the visualized paranasal sinuses. No mastoid or middle ear effusion. The orbits are normal. IMPRESSION: 1. Improved hydrocephalus. 2. Persistent intraventricular extension of hemorrhage from the right caudate head hematoma. Electronically Signed   By: Ulyses Jarred M.D.   On: 12/15/2020 02:22   CT HEAD WO CONTRAST  Result Date: 12/14/2020 CLINICAL DATA:  Follow-up intracranial hemorrhage EXAM: CT HEAD WITHOUT CONTRAST TECHNIQUE: Contiguous axial images were obtained from the base of the skull through the vertex without intravenous contrast. COMPARISON:  12/13/2020 FINDINGS: Brain: There again noted changes consistent with hemorrhage in the head of the caudate nucleus on the right. Intraventricular extension into the right lateral ventricle is noted. The thrombus is stable although there are changes of progressive dilatation in the lateral and third ventricles. Fourth ventricle appears within normal limits. No new hemorrhage is seen. Findings suggestive of increasing intracranial pressure are noted with decrease in the sulcal markings when compared with the previous day. Thrombus is noted within the third ventricle which appears to extend into cerebral aqueduct causing the dilatation. Vascular: No hyperdense vessel or unexpected calcification. Skull: Normal. Negative for fracture or focal lesion. Sinuses/Orbits: No acute finding. Other: None. IMPRESSION: Persistent hemorrhage in the head of the caudate nucleus on the right with evidence of interventricular thrombus in the lateral ventricles bilaterally and third ventricle. There is interval dilatation of the lateral and third ventricles with findings of thrombus and apparent occlusion of the cerebral aqueduct related to change in the morphology of the third ventricular thrombus. Additionally signs of increased intracranial pressure are noted with  decreased sulcal markings when compared with the previous day. Critical Value/emergent results were called by telephone at the time of interpretation on 12/14/2020 at 4:42 pm to Dr. Rosalin Hawking , who verbally acknowledged these results. Electronically Signed   By: [name] M.D.   On: 12/14/2020 16:43  CT HEAD WO CONTRAST  Result Date: 12/13/2020 CLINICAL DATA:  Worsening headache EXAM: CT HEAD WITHOUT CONTRAST TECHNIQUE: Contiguous axial images were obtained from the base of the skull through the vertex without intravenous contrast. COMPARISON:  12/11/2020 FINDINGS: Brain: Again seen is the right basal ganglia intracerebral hemorrhage with intraventricular extension. No significant change since prior study. Stable mild communicating hydrocephalus. No significant midline shift. Vascular: No hyperdense vessel or unexpected calcification. Skull: No acute calvarial abnormality. 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 No change since prior study. Electronically Signed   By: Rolm Baptise M.D.   On: 12/13/2020 21:40   CT HEAD WO CONTRAST  Result Date: 12/12/2020 CLINICAL DATA:  Stroke follow-up.  Pupillary asymmetry. EXAM: CT HEAD WITHOUT CONTRAST TECHNIQUE: Contiguous axial images were obtained from the base of the skull through the vertex without intravenous contrast. COMPARISON:  Head CT 12/11/2020 at 7:28 p.m. FINDINGS: Brain: Unchanged appearance of intraparenchymal hematoma centered at the right caudate head. There is extension of hemorrhage into the ventricles with mild communicating hydrocephalus. There is no midline shift. No new site of hemorrhage. Vascular: No abnormal hyperdensity of the major intracranial arteries or dural venous sinuses. No intracranial atherosclerosis. Skull: The visualized skull base, calvarium and extracranial soft tissues are normal. Sinuses/Orbits: No fluid levels or advanced mucosal  thickening of the visualized paranasal sinuses. No mastoid or middle ear effusion. The orbits are normal. IMPRESSION: Unchanged appearance of intraparenchymal hematoma centered at the right caudate head with intraventricular extension and mild communicating hydrocephalus. Electronically Signed   By: Ulyses Jarred M.D.   On: 12/12/2020 00:10   CT Head Wo Contrast  Result Date: 12/11/2020 CLINICAL DATA:  Headache EXAM: CT HEAD WITHOUT CONTRAST TECHNIQUE: Contiguous axial images were obtained from the base of the skull through the vertex without intravenous contrast. COMPARISON:  CT brain 02/19/2020 FINDINGS: Brain: 19 x 20 mm acute hemorrhage within the right caudate/basal ganglia. Moderate acute hemorrhage within the right lateral ventricle with additional hemorrhage in the third and fourth ventricles. The ventricles are mildly enlarged. No midline shift. Mild chronic small vessel ischemic changes of the white matter. Vascular: No hyperdense vessels.  No unexpected calcification Skull: Normal. Negative for fracture or focal lesion. Sinuses/Orbits: No acute finding. Other: None IMPRESSION: 1. Acute hemorrhage within the right caudate/basal ganglia with hemorrhage in the right lateral, third, and fourth ventricles. No midline shift. The ventricles are slightly dilated as compared with prior CT from 2021. 2. Mild chronic small vessel ischemic change of the white matter. Critical Value/emergent results were called by telephone at the time of interpretation on 12/11/2020 at 7:45 pm to provider ADAM CURATOLO , who verbally acknowledged these results. Electronically Signed   By: Donavan Foil M.D.   On: 12/11/2020 19:50   MR MRA HEAD WO CONTRAST  Result Date: 12/12/2020 CLINICAL DATA:  Intracranial hemorrhage. EXAM: MRI HEAD WITHOUT AND WITH CONTRAST MRA HEAD WITHOUT CONTRAST TECHNIQUE: Multiplanar, multi-echo pulse sequences of the brain and surrounding structures were acquired without and with intravenous contrast.  Angiographic images of the Circle of Willis were acquired using MRA technique without intravenous contrast. CONTRAST:  72m GADAVIST GADOBUTROL 1 MMOL/ML IV SOLN COMPARISON:  Head CT 12/11/2020 FINDINGS: MRI HEAD FINDINGS Brain: Unchanged intraparenchymal hematoma of the right caudate head with intraventricular extension. 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 No acute infarct. There is multifocal hyperintense T2-weighted signal within the white matter. Generalized volume loss without a clear lobar predilection. There is no  abnormal contrast enhancement. Vascular: Major flow voids are preserved. Skull and upper cervical spine: Normal calvarium and skull base. Visualized upper cervical spine and soft tissues are normal. Sinuses/Orbits:No paranasal sinus fluid levels or advanced mucosal thickening. No mastoid or middle ear effusion. Normal orbits. MRA HEAD FINDINGS POSTERIOR CIRCULATION: --Vertebral arteries: Normal --Inferior cerebellar arteries: Normal. --Basilar artery: Normal. --Superior cerebellar arteries: Normal. --Posterior cerebral arteries: Normal. Both are predominantly supplied by the posterior communicating arteries (p-comm). ANTERIOR CIRCULATION: --Intracranial internal carotid arteries: Normal. --Anterior cerebral arteries (ACA): Normal. --Middle cerebral arteries (MCA): Normal. ANATOMIC VARIANTS: Fetal origins of both posterior cerebral arteries. IMPRESSION: 1. Unchanged intraparenchymal hematoma of the right caudate head with intraventricular extension and mild communicating hydrocephalus. 2. Normal intracranial MRA. Electronically Signed   By: Ulyses Jarred M.D.   On: 12/12/2020 20:50   MR BRAIN W WO CONTRAST  Result Date: 12/12/2020 CLINICAL DATA:  Intracranial hemorrhage. EXAM: MRI HEAD WITHOUT AND WITH CONTRAST MRA HEAD WITHOUT CONTRAST TECHNIQUE: Multiplanar, multi-echo pulse sequences of the brain and surrounding structures were acquired without and with intravenous contrast.  Angiographic images of the Circle of Willis were acquired using MRA technique without intravenous contrast. CONTRAST:  67m GADAVIST GADOBUTROL 1 MMOL/ML IV SOLN COMPARISON:  Head CT 12/11/2020 FINDINGS: MRI HEAD FINDINGS Brain: Unchanged intraparenchymal hematoma of the right caudate head with intraventricular extension. 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 No acute infarct. There is multifocal hyperintense T2-weighted signal within the white matter. Generalized volume loss without a clear lobar predilection. There is no abnormal contrast enhancement. Vascular: Major flow voids are preserved. Skull and upper cervical spine: Normal calvarium and skull base. Visualized upper cervical spine and soft tissues are normal. Sinuses/Orbits:No paranasal sinus fluid levels or advanced mucosal thickening. No mastoid or middle ear effusion. Normal orbits. MRA HEAD FINDINGS POSTERIOR CIRCULATION: --Vertebral arteries: Normal --Inferior cerebellar arteries: Normal. --Basilar artery: Normal. --Superior cerebellar arteries: Normal. --Posterior cerebral arteries: Normal. Both are predominantly supplied by the posterior communicating arteries (p-comm). ANTERIOR CIRCULATION: --Intracranial internal carotid arteries: Normal. --Anterior cerebral arteries (ACA): Normal. --Middle cerebral arteries (MCA): Normal. ANATOMIC VARIANTS: Fetal origins of both posterior cerebral arteries. IMPRESSION: 1. Unchanged intraparenchymal hematoma of the right caudate head with intraventricular extension and mild communicating hydrocephalus. 2. Normal intracranial MRA. Electronically Signed   By: KUlyses JarredM.D.   On: 12/12/2020 20:50   DG CHEST PORT 1 VIEW  Result Date: 12/15/2020 CLINICAL DATA:  Headache.  Bradycardia. EXAM: PORTABLE CHEST 1 VIEW COMPARISON:  02/19/2020 FINDINGS: The heart size and mediastinal contours are within normal limits. Both lungs are clear. The visualized skeletal structures are unremarkable. IMPRESSION: No  active disease. Electronically Signed   By: WLucienne CapersM.D.   On: 12/15/2020 22:16   EEG adult  Result Date: 12/14/2020 YLora Havens MD     12/15/2020  7:07 AM Patient Name: Matthew COUPERMRN: [id number] Attending: PLora HavensReferring Physician/Provider: Dr NConsuella LoseDate: 12/14/2020 Duration: 24.46 mins Patient history: 60y.o. male with basal ganglia hemorrhage with intraventricular extension who has been neurologically well, with what appears to be episodic confusion and somnolence. EEG to evaluate for seizure  Level of alertness: Awake  AEDs during EEG study: None Technical aspects: This EEG study was done with scalp electrodes positioned according to the 10-20 International system of electrode placement. Electrical activity was acquired at a sampling rate of 500Hz  and reviewed with a high frequency filter of 70Hz  and a low frequency filter of 1Hz . EEG data were recorded continuously and digitally  stored. Description: The posterior dominant rhythm consists of 8 Hz activity of moderate voltage (25-35 uV) seen predominantly in posterior head regions, symmetric and reactive to eye opening and eye closing. EEG showed continuous 3 to 6 Hz theta-delta slowing in right hemisphere, maximal temporal region. Hyperventilation and photic stimulation were not performed.   ABNORMALITY - Continuous slow, right hemisphere, maximal temporal region IMPRESSION: This study is suggestive of cortical dysfunction arising from right hemisphere, maximal right temporal region, likely secondary to underlying structural abnormality/bleed. No seizures or epileptiform discharges were seen throughout the recording. Priyanka Barbra Sarks   Overnight EEG with video  Result Date: 12/15/2020 Lora Havens, MD     12/16/2020  9:18 AM Patient Name: Matthew Francis MRN: [id number] Epilepsy Attending: Lora Havens Referring Physician/Provider: Dr Consuella Lose Duration: 12/14/2020 1932 to 12/15/2020 1244  Patient  history: 60 y.o. male with basal ganglia hemorrhage with intraventricular extension who has been neurologically well, with what appears to be episodic confusion and somnolence. EEG to evaluate for seizure  Level of alertness: Awake  AEDs during EEG study: None  Technical aspects: This EEG study was done with scalp electrodes positioned according to the 10-20 International system of electrode placement. Electrical activity was acquired at a sampling rate of 500Hz  and reviewed with a high frequency filter of 70Hz  and a low frequency filter of 1Hz . EEG data were recorded continuously and digitally stored.  Description: The posterior dominant rhythm consists of 8 Hz activity of moderate voltage (25-35 uV) seen predominantly in posterior head regions, symmetric and reactive to eye opening and eye closing. EEG initially showed continuous 3 to 6 Hz theta-delta slowing in right hemisphere, maximal temporal region which gradually improved to intermittent 3-5hz  theta-delta slowing in right hemisphere, maximal temporal region. Hyperventilation and photic stimulation were not performed.    ABNORMALITY - Continuous slow, right hemisphere, maximal temporal region  IMPRESSION: This study is suggestive of cortical dysfunction arising from right hemisphere, maximal right temporal region, likely secondary to underlying structural abnormality/bleed. There was gradual improvement in slowing which could indicate that it was post-ictal slowing as well.  No seizures or epileptiform discharges were seen throughout the recording.  Lora Havens   ECHOCARDIOGRAM COMPLETE  Result Date: 12/12/2020    ECHOCARDIOGRAM REPORT   Patient Name:   LOVE MILBOURNE Date of Exam: 12/12/2020 Medical Rec #:  [id number]     Height:       70.5 in Accession #:    [id number]    Weight:       238.1 lb Date of Birth:  Jan 23, 1961     BSA:          2.260 m Patient Age:    29 years      BP:           138/94 mmHg Patient Gender: M             HR:           95  bpm. Exam Location:  Inpatient Procedure: 2D Echo, Color Doppler and Cardiac Doppler Indications:    Stroke I63.9  History:        Patient has no prior history of Echocardiogram examinations.                 Risk Factors:Dyslipidemia and Hypertension.  Sonographer:    Bernadene Person RDCS Referring Phys: [id number] [place]  1. Left ventricular ejection fraction, by estimation, is 60 to 65%. The left ventricle has  normal function. The left ventricle has no regional wall motion abnormalities. Left ventricular diastolic parameters are consistent with Grade I diastolic dysfunction (impaired relaxation).  2. Right ventricular systolic function is normal. The right ventricular size is normal. There is normal pulmonary artery systolic pressure. The estimated right ventricular systolic pressure is 23.3 mmHg.  3. The mitral valve is normal in structure. Trivial mitral valve regurgitation. No evidence of mitral stenosis.  4. The aortic valve is normal in structure. Aortic valve regurgitation is not visualized. No aortic stenosis is present.  5. The inferior vena cava is normal in size with greater than 50% respiratory variability, suggesting right atrial pressure of 3 mmHg. Conclusion(s)/Recommendation(s): No intracardiac source of embolism detected on this transthoracic study. A transesophageal echocardiogram is recommended to exclude cardiac source of embolism if clinically indicated. FINDINGS  Left Ventricle: Left ventricular ejection fraction, by estimation, is 60 to 65%. The left ventricle has normal function. The left ventricle has no regional wall motion abnormalities. The left ventricular internal cavity size was normal in size. There is  borderline left ventricular hypertrophy. Left ventricular diastolic parameters are consistent with Grade I diastolic dysfunction (impaired relaxation). Right Ventricle: The right ventricular size is normal. No increase in right ventricular wall thickness. Right  ventricular systolic function is normal. There is normal pulmonary artery systolic pressure. The tricuspid regurgitant velocity is 2.04 m/s, and  with an assumed right atrial pressure of 3 mmHg, the estimated right ventricular systolic pressure is 00.7 mmHg. Left Atrium: Left atrial size was normal in size. Right Atrium: Right atrial size was normal in size. Pericardium: There is no evidence of pericardial effusion. Mitral Valve: The mitral valve is normal in structure. Trivial mitral valve regurgitation. No evidence of mitral valve stenosis. Tricuspid Valve: The tricuspid valve is normal in structure. Tricuspid valve regurgitation is not demonstrated. No evidence of tricuspid stenosis. Aortic Valve: The aortic valve is normal in structure. Aortic valve regurgitation is not visualized. No aortic stenosis is present. Pulmonic Valve: The pulmonic valve was normal in structure. Pulmonic valve regurgitation is mild. No evidence of pulmonic stenosis. Aorta: The aortic root is normal in size and structure. Venous: The inferior vena cava is normal in size with greater than 50% respiratory variability, suggesting right atrial pressure of 3 mmHg. IAS/Shunts: No atrial level shunt detected by color flow Doppler.  LEFT VENTRICLE PLAX 2D LVIDd:         4.20 cm  Diastology LVIDs:         2.40 cm  LV e' medial:    5.78 cm/s LV PW:         1.10 cm  LV E/e' medial:  9.8 LV IVS:        0.90 cm  LV e' lateral:   10.20 cm/s LVOT diam:     2.00 cm  LV E/e' lateral: 5.5 LV SV:         65 LV SV Index:   29 LVOT Area:     3.14 cm  RIGHT VENTRICLE RV S prime:     14.30 cm/s TAPSE (M-mode): 1.6 cm LEFT ATRIUM             Index       RIGHT ATRIUM           Index LA diam:        3.50 cm 1.55 cm/m  RA Area:     13.90 cm LA Vol (A2C):   51.3 ml 22.70 ml/m RA Volume:   35.30  ml  15.62 ml/m LA Vol (A4C):   57.2 ml 25.31 ml/m LA Biplane Vol: 59.3 ml 26.24 ml/m  AORTIC VALVE LVOT Vmax:   124.00 cm/s LVOT Vmean:  85.300 cm/s LVOT VTI:     0.206 m  AORTA Ao Root diam: 3.30 cm Ao Asc diam:  3.30 cm MITRAL VALVE               TRICUSPID VALVE MV Area (PHT): 2.26 cm    TR Peak grad:   16.6 mmHg MV Decel Time: 335 msec    TR Vmax:        204.00 cm/s MV E velocity: 56.40 cm/s MV A velocity: 62.90 cm/s  SHUNTS MV E/A ratio:  0.90        Systemic VTI:  0.21 m                            Systemic Diam: 2.00 cm Cherlynn Kaiser MD Electronically signed by Cherlynn Kaiser MD Signature Date/Time: 12/12/2020/4:46:34 PM    Final    VAS US CAROTID  Result Date: 12/17/2020 Carotid Arterial Duplex Study Patient Name:  JAVARIAN JAKUBIAK  Date of Exam:   12/13/2020 Medical Rec #: [id number]      Accession #:    [id number] Date of Birth: 1960-11-17      Patient Gender: M Patient Age:   59Y Exam Location:  [facility] Procedure:      VAS US CAROTID Referring Phys: [id number] Rosalin Hawking --------------------------------------------------------------------------------  Indications:       CVA. Risk Factors:      Hypertension, hyperlipidemia. Comparison Study:  no prior Performing Technologist: Archie Patten RVS  Examination Guidelines: A complete evaluation includes B-mode imaging, spectral Doppler, color Doppler, and power Doppler as needed of all accessible portions of each vessel. Bilateral testing is considered an integral part of a complete examination. Limited examinations for reoccurring indications may be performed as noted.  Right Carotid Findings: +----------+--------+--------+--------+------------------+--------+           PSV cm/sEDV cm/sStenosisPlaque DescriptionComments +----------+--------+--------+--------+------------------+--------+ CCA Prox  77      9               heterogenous               +----------+--------+--------+--------+------------------+--------+ CCA Distal65      14              heterogenous               +----------+--------+--------+--------+------------------+--------+ ICA Prox  39      13      1-39%    heterogenous               +----------+--------+--------+--------+------------------+--------+ ICA Distal37      14                                         +----------+--------+--------+--------+------------------+--------+ ECA       69      11                                         +----------+--------+--------+--------+------------------+--------+ +----------+--------+-------+--------+-------------------+           PSV cm/sEDV cmsDescribeArm Pressure (mmHG) +----------+--------+-------+--------+-------------------+ [id number]                                         +----------+--------+-------+--------+-------------------+ +---------+--------+--+--------+-+---------+  VertebralPSV cm/s31EDV cm/s9Antegrade +---------+--------+--+--------+-+---------+  Left Carotid Findings: +----------+--------+--------+--------+------------------+--------+           PSV cm/sEDV cm/sStenosisPlaque DescriptionComments +----------+--------+--------+--------+------------------+--------+ CCA Prox  118     22              heterogenous               +----------+--------+--------+--------+------------------+--------+ CCA Distal63      21              heterogenous               +----------+--------+--------+--------+------------------+--------+ ICA Prox  53      22      1-39%   heterogenous               +----------+--------+--------+--------+------------------+--------+ ICA Distal65      26                                         +----------+--------+--------+--------+------------------+--------+ ECA       73      18                                         +----------+--------+--------+--------+------------------+--------+ +----------+--------+--------+--------+-------------------+           PSV cm/sEDV cm/sDescribeArm Pressure (mmHG) +----------+--------+--------+--------+-------------------+ [id number]                                           +----------+--------+--------+--------+-------------------+ +---------+--------+--+--------+-+---------+ VertebralPSV cm/s32EDV cm/s9Antegrade +---------+--------+--+--------+-+---------+   Summary: Right Carotid: Velocities in the right ICA are consistent with a 1-39% stenosis. Left Carotid: Velocities in the left ICA are consistent with a 1-39% stenosis. Vertebrals: Bilateral vertebral arteries demonstrate antegrade flow. *See table(s) above for measurements and observations.  Electronically signed by Antony Contras MD on 12/17/2020 at 1:25:39 PM.    Final     Labs:  Basic Metabolic Panel: Recent Labs  Lab 12/26/20 0623  NA 137  K 4.0  CL 104  CO2 22  GLUCOSE 120*  BUN 13  CREATININE 1.27*  CALCIUM 10.5*    CBC: No results for input(s): WBC, NEUTROABS, HGB, HCT, MCV, PLT in the last 168 hours.   CBG: Recent Labs  Lab 12/30/20 1149 12/30/20 1700 12/30/20 2142 12/31/20 1156 12/31/20 2140  GLUCAP 117* 185* 109* 180* 139*   Family history.  Sister with cancer of unknown.  Denies any colon cancer esophageal cancer or rectal cancer  Brief HPI:   Matthew Francis is a 60 y.o. right-handed male with history of hypertension obesity ulcerative colitis as well as hyperlipidemia.  Per chart review lives alone independent prior to admission working at [employer].  1 level home one-step to entry.  He does have family in the [place] area.  Presented 12/11/2020 with headache as well as nausea and vomiting.  Admission chemistries unremarkable except glucose 125 creatinine 1.31 hemoglobin A1c 5.9 urine drug screen positive cocaine ammonia level 39.  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  No midline shift.  MRA was unremarkable.  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  Echocardiogram with ejection fraction of 60 to 99% grade 1 diastolic dysfunction.  EEG negative for seizure.  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  Dr. Kathyrn Sheriff follow-up for right caudate hemorrhage as well as intraventricular extension question hydrocephalus no current plan for EVD.  Tolerating a regular diet.  Blood culture 12/16/2020 gram-negative rods found in 1 culture initially placed on Rocephin empirically.  Patient with bouts of intermittent restlessness and agitation placed on Risperdal.  Therapy evaluations completed due to patient decreased functional mobility was admitted for a comprehensive rehab program.   Hospital Course: KEISTON MANLEY was admitted to rehab 12/21/2020 for inpatient therapies to consist of PT, ST and OT at least three hours five days a week. Past admission physiatrist, therapy team and rehab RN have worked together to provide customized collaborative inpatient rehab.  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  Remained stable follow-up neurosurgery.  Pain management bouts of headache placed on low-dose Topamax.  Mood stabilization with the use of Risperdal reduced to 0.5 mg twice daily.  Blood pressure controlled on clonidine Norvasc as well as hydralazine would need outpatient follow-up.  It was noted history of polysubstance abuse urine drug screen positive cocaine patient received count regards to cessation of illicit drug products.  Patient with history of ulcerative colitis remained on Xifaxan as well as Entocort.  Hospital course GNR found 1 blood culture 12/16/2020 empiric Rocephin later discontinued patient remained afebrile.  AKI on CKD creatinine 1.5-1.48 and monitored.  Blood sugars hemoglobin A1c 5.9 blood sugars well  controlled maintained on metformin.   Blood pressures were monitored on TID basis and controlled  Diabetes has been monitored with ac/hs CBG checks and SSI was use prn for tighter BS control.    Rehab course: During patient's stay in rehab weekly team conferences were held to monitor patient's progress, set goals and discuss barriers to discharge. At admission, patient required min assist 200 feet rolling walker minimal assist squat pivot transfers minimal assist sit to stand  Physical exam.  Blood pressure 129/87 pulse 104 temperature 92 respiration 20 oxygen saturation 90% room air Constitutional.  No acute distress HEENT Head.  Normocephalic and atraumatic Eyes.  Pupils round and reactive to light no discharge without nystagmus Neck.  Supple nontender no JVD without thyromegaly Cardiac regular rate rhythm not any extra sounds or murmur heard Abdomen.  Soft nontender positive bowel sounds without rebound Respiratory effort normal no respiratory distress without wheeze Skin.  Warm and dry Neurologic.  Alert a bit restless makes eye contact with examiner.  Provides name and age.  He did have some short-term memory deficits.  Impaired insight and awareness.  He could not provide some biographical information.  Moves all 4 limbs.  Mild left PD and decreased FMC/apraxia left upper left lower extremity.  Senses pain in all 4.  He/She  has had improvement in activity tolerance, balance, postural control as well as ability to compensate for deficits. He/She has had improvement in functional use RUE/LUE  and RLE/LLE as well as improvement in awareness.  Ambulates 150 feet x 2 without assistive device.  He is able to change speed demonstrates mild gait deviations.  He was somewhat distracted at times during sessions.  He can gather his belongings for activities that have been homemaking.  Up-and-down stairs with supervision.  Speech  therapy follow-up for cognitive deficits demonstrates fluctuating  level of cognitive functioning but overall requires mod max assist for multimodal cues to complete functional and mildly complex tasks safely in regards to problem solving recall with use of strategies attention and awareness of errors.  It was discussed at length with his family need for supervision for safety that had been arranged and stressed the need for 24-hour supervision for his safety.  Family teaching completed and discharged to home       Disposition: Discharged to home    Diet: Regular  Special Instructions: No driving smoking alcohol or illicit drug use  Medications at discharge 1.  Tylenol as needed 2.  Norvasc 10 mg p.o. daily 3.  Entocort 9 mg p.o. daily 4.  Clonidine 0.1 mg p.o. daily 5.  Hydralazine 100 mg p.o. twice daily 6.  Imodium as needed 7.  Melatonin 3 mg p.o. nightly 8.  Robaxin 500 mg p.o. every 8 hours as needed muscle spasms 9.  Multivitamin daily 10.  Protonix 40 mg p.o. twice daily 11.  Metamucil 1 packet daily 12.  Seroquel 25 mg p.o. nightly as needed agitation 13.Xifaxan 550 mg p.o. twice daily 14.  Risperdal 0.25 mg p.o. twice daily 15.  Mylicon 80 mg p.o. 4 times daily 16.  Topamax 25 mg p.o. nightly 17.  Vitamin D 50,000 units every Wednesday 18.  Crestor 10 mg every other day   30-35 minutes were spent completing discharge summary and discharge planning  Discharge Instructions     Ambulatory referral to Neurology   Complete by: As directed    An appointment is requested in approximately: 4 weeks [place]   Ambulatory referral to Occupational Therapy   Complete by: As directed    Eval and treat   Ambulatory referral to Physical Medicine Rehab   Complete by: As directed    Moderate complexity follow-up 1 to 2 weeks [place]   Ambulatory referral to Physical Therapy   Complete by: As directed    Eval and treat   Ambulatory referral to Speech Therapy   Complete by: As directed    Eval and treat        Follow-up Information      Meredith Staggers, MD Follow up.   Specialty: Physical Medicine and Rehabilitation Why: Office to call for appointment Contact information: [address] [place] [address] [phone]                 Signed: Cathlyn Parsons 01/01/2021, 5:17 AM

## 2020-12-31 LAB — GLUCOSE, CAPILLARY
Glucose-Capillary: 180 mg/dL — ABNORMAL HIGH (ref 70–99)
Glucose-Capillary: 139 mg/dL — ABNORMAL HIGH (ref 70–99)

## 2020-12-31 MED ORDER — CLONIDINE HCL 0.1 MG PO TABS: 0.1000 mg | ORAL_TABLET | Freq: Every day | ORAL | 11 refills | Status: DC

## 2020-12-31 MED ORDER — LOPERAMIDE HCL 2 MG PO CAPS: 4.0000 mg | ORAL_CAPSULE | ORAL | 0 refills | Status: AC | PRN

## 2020-12-31 MED ORDER — ACETAMINOPHEN 325 MG PO TABS: 650.0000 mg | ORAL_TABLET | Freq: Four times a day (QID) | ORAL | Status: AC | PRN

## 2020-12-31 MED ORDER — ASCORBIC ACID 500 MG PO TABS: 500.0000 mg | ORAL_TABLET | Freq: Every day | ORAL | 0 refills | Status: AC

## 2020-12-31 MED ORDER — VITAMIN D3 1.25 MG (50000 UT) PO CAPS: 50000.0000 [IU] | ORAL_CAPSULE | ORAL | 0 refills | Status: DC

## 2020-12-31 MED ORDER — AMLODIPINE BESYLATE 10 MG PO TABS: 10.0000 mg | ORAL_TABLET | Freq: Every day | ORAL | 2 refills | Status: DC

## 2020-12-31 MED ORDER — HYDRALAZINE HCL 100 MG PO TABS: 100.0000 mg | ORAL_TABLET | Freq: Two times a day (BID) | ORAL | 0 refills | Status: DC

## 2020-12-31 MED ORDER — ESOMEPRAZOLE MAGNESIUM 20 MG PO CPDR: 20.0000 mg | DELAYED_RELEASE_CAPSULE | Freq: Every day | ORAL | 0 refills | Status: DC

## 2020-12-31 MED ORDER — VITAMIN B12 100 MCG PO TABS: 100.0000 ug | ORAL_TABLET | Freq: Every day | ORAL | 0 refills | Status: AC

## 2020-12-31 MED ORDER — TOPIRAMATE 25 MG PO TABS: 25.0000 mg | ORAL_TABLET | Freq: Every day | ORAL | 0 refills | Status: DC

## 2020-12-31 MED ORDER — ROSUVASTATIN CALCIUM 10 MG PO TABS: 10.0000 mg | ORAL_TABLET | ORAL | 0 refills | Status: DC

## 2020-12-31 MED ORDER — RIFAXIMIN 550 MG PO TABS: 550.0000 mg | ORAL_TABLET | Freq: Two times a day (BID) | ORAL | 0 refills | Status: DC

## 2020-12-31 MED ORDER — MELATONIN 3 MG PO TABS: 3.0000 mg | ORAL_TABLET | Freq: Every day | ORAL | 0 refills | Status: AC

## 2020-12-31 MED ORDER — METHOCARBAMOL 500 MG PO TABS: 500.0000 mg | ORAL_TABLET | Freq: Three times a day (TID) | ORAL | 0 refills | Status: DC | PRN

## 2020-12-31 MED ORDER — BUDESONIDE 3 MG PO CPEP: 9.0000 mg | ORAL_CAPSULE | Freq: Every day | ORAL | 0 refills | Status: DC

## 2020-12-31 MED ORDER — RISPERIDONE 0.25 MG PO TABS: 0.2500 mg | ORAL_TABLET | Freq: Two times a day (BID) | ORAL | 0 refills | Status: DC

## 2020-12-31 MED ORDER — RISPERIDONE 0.25 MG PO TABS
0.2500 mg | ORAL_TABLET | Freq: Two times a day (BID) | ORAL | Status: DC
  Administered 2020-12-31 – 2021-01-01 (×2): 0.25 mg via ORAL
  Filled 2020-12-31 (×2): qty 1

## 2020-12-31 MED ORDER — QUETIAPINE FUMARATE 25 MG PO TABS: 25.0000 mg | ORAL_TABLET | Freq: Every evening | ORAL | 0 refills | Status: DC | PRN

## 2020-12-31 NOTE — Progress Notes (Signed)
PROGRESS NOTE   Subjective/Complaints: No new issues. Says aunt may have some questions about discharge. Had a good weekend. Flonase helped nose  ROS: Patient denies fever, rash, sore throat, blurred vision, nausea, vomiting, diarrhea, cough, shortness of breath or chest pain, joint or back pain, headache, or mood change.     Objective:   No results found. No results for input(s): WBC, HGB, HCT, PLT in the last 72 hours.  No results for input(s): NA, K, CL, CO2, GLUCOSE, BUN, CREATININE, CALCIUM in the last 72 hours.   Intake/Output Summary (Last 24 hours) at 12/31/2020 1117 Last data filed at 12/31/2020 0728 Gross per 24 hour  Intake 477 ml  Output 200 ml  Net 277 ml        Physical Exam: Vital Signs Blood pressure 130/80, pulse (!) 55, temperature 98.2 F (36.8 C), resp. rate 18, height 5' 11"  (1.803 m), weight 90 kg, SpO2 97 %.    Constitutional: No distress . Vital signs reviewed. HEENT: EOMI, oral membranes moist Neck: supple Cardiovascular: RRR without murmur. No JVD    Respiratory/Chest: CTA Bilaterally without wheezes or rales. Normal effort    GI/Abdomen: BS +, non-tender, non-distended Ext: no clubbing, cyanosis, or edema Psych: pleasant and cooperative  Ext: no clubbing, cyanosis, or edema Psych: pleasant and cooperative, more quiet today Skin: intact Neurological:    Mental Status: He is alert.    Comments: appropriate. Improving awareness and insight. Still with STM deficits. Normal speech, language.   Moves all 4 limbs. Mild left PD and decreased FMC/apraxia LUE and LLE ongoing. Senses pain and LT in all 4's. No focal CN findings.     Assessment/Plan: 1. Functional deficits which require 3+ hours per day of interdisciplinary therapy in a comprehensive inpatient rehab setting. Physiatrist is providing close team supervision and 24 hour management of active medical problems listed  below. Physiatrist and rehab team continue to assess barriers to discharge/monitor patient progress toward functional and medical goals  Care Tool:  Bathing    Body parts bathed by patient: Right arm, Left arm, Chest, Abdomen, Front perineal area, Buttocks, Right upper leg, Left upper leg, Right lower leg, Face, Left lower leg   Body parts bathed by helper: Left lower leg     Bathing assist Assist Level: Set up assist     Upper Body Dressing/Undressing Upper body dressing   What is the patient wearing?: Pull over shirt    Upper body assist Assist Level: Independent    Lower Body Dressing/Undressing Lower body dressing      What is the patient wearing?: Underwear/pull up     Lower body assist Assist for lower body dressing: Independent     Toileting Toileting    Toileting assist Assist for toileting: Independent     Transfers Chair/bed transfer  Transfers assist     Chair/bed transfer assist level: Independent     Locomotion Ambulation   Ambulation assist      Assist level: Contact Guard/Touching assist Assistive device: No Device Max distance: 1783 ft   Walk 10 feet activity   Assist     Assist level: Supervision/Verbal cueing Assistive device: No Device   Walk 50  feet activity   Assist    Assist level: Contact Guard/Touching assist Assistive device: No Device    Walk 150 feet activity   Assist    Assist level: Contact Guard/Touching assist Assistive device: No Device    Walk 10 feet on uneven surface  activity   Assist Walk 10 feet on uneven surfaces activity did not occur: Safety/medical concerns         Wheelchair     Assist Will patient use wheelchair at discharge?: No   Wheelchair activity did not occur: N/A         Wheelchair 50 feet with 2 turns activity    Assist    Wheelchair 50 feet with 2 turns activity did not occur: N/A       Wheelchair 150 feet activity     Assist  Wheelchair 150  feet activity did not occur: N/A       Blood pressure 130/80, pulse (!) 55, temperature 98.2 F (36.8 C), resp. rate 18, height 5' 11"  (1.803 m), weight 90 kg, SpO2 97 %.    Medical Problem List and Plan: 1.  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 with PT, OT, SLP  -Continue CIR therapies including PT, OT, and SLP      2.  Antithrombotics: -DVT/anticoagulation: SCDs             -antiplatelet therapy: N/A 3. Pain Management: Robaxin as needed, Tylenol as needed  -pt told me headaches are gradually improving  -continue low dose topamax but change to hs given daytime fatigue  controlled 4. Mood: Provide emotional support             -antipsychotic agents: Risperdal 1 mg twice daily for agitated behavior   -reduced to 0.50m bid given fatigue---reduce to 0.283mtoday   -prn seroquel for sleep   7/17- very appropriate- per nursing, hasn't been agitated- con't regimen 5. Neuropsych: This patient is not capable of making decisions on his own behalf. 6. Skin/Wound Care: Routine skin checks 7. Fluids/Electrolytes/Nutrition: double portions  I personally reviewed the patient's labs today.    7/12 -mild hyponatremia--improved 7/13  -AST sl elevated. Likely reactive, LFT's decreased in general    8.  Hypertension.  Clonidine 01 mg daily, Norvasc 10 mg daily, hydralazine 100 mg twice daily.  Monitor with increased mobility             -BP under control at present 7/15 9.  Ulcerative colitis.Xifaxan 550 mg twice daily, Entocort 9 mg daily             -simethicone for gas/bloating  - added fiber and prn metamucil for loose stool  -stools improved. I increased his protonix to bid for "acid" 10.  GNR found 1 blood culture 12/16/2020.  Placed on empiric Rocephin 12/18/2020             -cx + diptheroids---dc abx, spoke  with pharmacy 11.  AKI/?CKD.  Cr 1.51---> 1.48 7/11 12.  Type 2 diabetes mellitus.  Hemoglobin A1c 5.9.  SSI. CBGs 113-190. Cr 1.51-   -consider resuming metformin if needed and Cr normal  CBG (last 3) --controlled 7/15 Recent Labs    12/30/20 1149 12/30/20 1700 12/30/20 2142  GLUCAP 117* 185*  109*    7/17- BG's well controlled- con't regimen 13. Fatigue: - vitamin D level  wnl - thyroid levels not too valuable at this stage of recovery from [place] -reduce risperdal, adjust topamax to night -he is sleeping better. -7/18 improved. Further reduce risperdal today   .    LOS: 10 days A FACE TO FACE EVALUATION WAS PERFORMED  Meredith Staggers 12/31/2020, 11:17 AM

## 2020-12-31 NOTE — Progress Notes (Signed)
Speech Language Pathology Discharge Summary  Patient Details  Name: Matthew Francis MRN: [id number] Date of Birth: [birth year]  Today's Date: 12/31/2020 SLP Individual Time: 7837-5423 SLP Individual Time Calculation (min): 45 min   Skilled Therapeutic Interventions: Skilled treatment session focused on cognitive goals. Upon arrival, patient was awake and alert. SLP facilitated session by providing supervision while patient performed basic self-care tasks at the sink and while donning shoes. Patient independently utilized his phone to call his aunt (on speaker phone) so SLP and provide education regarding patient's current cognitive functioning and strategies to utilize at home to maximize attention, recall and overall safety including the importance of 24 hour supervision. Patient's aunt verbalized understanding. SLP also provided education to the patient regarding memory compensatory strategies and how to incorporate strategies at discharge. Patient verbalized understanding and was able to show SLP how he is already utilizing his phone to record "to do" notes.  Patient ambulated back to his room with Min verbal cues needed for path finding. Patient left in room with NT present.    Patient has met 5 of 5 long term goals.  Patient to discharge at [facility] level.   Reasons goals not met: N/A   Clinical Impression/Discharge Summary: Patient has made functional gains and has met 5 of 5 LTGs this admission. Currently, patient requires overall Min A multimodal cues to complete functional and mildly complex tasks safety in regards to selective attention, error awareness, functional problem solving and recall with use of compensatory strategies. Patient and family education is complete and patient will discharge home with 24 hour supervision from family. Patient would benefit from f/u SLP services to maximize his cognitive functioning and overall functional independence in order to reduce caregiver burden.    Care Partner:  Caregiver Able to Provide Assistance: Yes  Type of Caregiver Assistance: Cognitive  Recommendation:  24 hour supervision/assistance;Outpatient SLP  Rationale for SLP Follow Up: Reduce caregiver burden;Maximize cognitive function and independence   Equipment: N/A   Reasons for discharge: Discharged from hospital;Treatment goals met        [place], [place] 12/31/2020, 11:52 AM

## 2020-12-31 NOTE — Progress Notes (Signed)
Occupational Therapy Discharge Summary  Patient Details  Name: Matthew Francis MRN: [id number] Date of Birth: 11/15/1960  Today's Date: 12/31/2020 OT Individual Time: 1030-1130 OT Individual Time Calculation (min): 60 min    Patient has met 10 of 10 long term goals due to improved activity tolerance, improved balance, postural control, ability to compensate for deficits, functional use of  LEFT upper and LEFT lower extremity, improved attention, improved awareness, and improved coordination.  Patient to discharge at overall Modified Independent level.  Patient's care partner is independent to provide the necessary cognitive assistance at discharge.    Reasons goals not met: All treatment goals met.   Recommendation:  Patient will benefit from ongoing skilled OT services in outpatient setting to continue to advance functional skills in the area of BADL and iADL.  Equipment: No equipment provided  Reasons for discharge: treatment goals met and discharge from hospital  Patient/family agrees with progress made and goals achieved: Yes  Skilled OT Intervention: Pt received supine with no c/o pain. Discussed d/c at length with focus on increasing anticipatory awareness in re to cognitive deficits and activities that will require supervision. Pt still with difficulty re anticipatory awareness and really only able to demo emergent awareness. Pt completed toileting and grooming tasks around room with mod I. In the kitchen pt completed cooking task- following directions from box of jello. Only deficits observed were poor planning ahead and organizing but pt did not demonstrate any safety concerns. Still recommended supervision with IADLs now. Pt returned to his room and was able to path find back to his room with no cueing!! Pt was left sitting EOB with bed alarm set.    OT Discharge Precautions/Restrictions  Precautions Precautions: Fall Precaution Comments: mild L hemi Restrictions Weight  Bearing Restrictions: No  Pain Assessment Pain Scale: 0-10 Pain Score: 0-No pain ADL ADL Eating: Independent Where Assessed-Eating: Chair Grooming: Independent Where Assessed-Grooming: Edge of bed Upper Body Bathing: Independent Where Assessed-Upper Body Bathing: Shower Lower Body Bathing: Setup Where Assessed-Lower Body Bathing: Shower Upper Body Dressing: Independent Where Assessed-Upper Body Dressing: Edge of bed Lower Body Dressing: Independent Where Assessed-Lower Body Dressing: Edge of bed Toileting: Independent Where Assessed-Toileting: Toilet Toilet Transfer: Independent Toilet Transfer Method: Ambulating Toilet Transfer Equipment: Grab bars Tub/Shower Transfer: Distant supervision Tub/Shower Transfer Method: Ambulating Walk-In Shower Transfer: Distant supervision Walk-In Shower Transfer Method: Ambulating Walk-In Shower Equipment: Grab bars Vision Baseline Vision/History: Wears glasses Wears Glasses: At all times Patient Visual Report: No change from baseline Vision Assessment?: No apparent visual deficits Additional Comments: No visual deficits but does not scan environment Perception  Perception: Impaired Spatial Orientation: significant challenges with attending to environment, pathfinding, and recall for locating objects in space or orientation of himself to objects or locations Praxis Praxis: Intact Cognition Overall Cognitive Status: Impaired/Different from baseline Arousal/Alertness: Awake/alert Orientation Level: Oriented X4 Attention: Sustained;Selective Sustained Attention: Appears intact Selective Attention: Impaired Selective Attention Impairment: Verbal basic;Functional basic Memory: Impaired Memory Impairment: Storage deficit;Retrieval deficit;Decreased recall of new information Awareness: Impaired Awareness Impairment: Emergent impairment Executive Function: Organizing;Decision Making Organizing: Impaired Organizing Impairment: Verbal  basic;Functional basic Decision Making: Impaired Decision Making Impairment: Verbal basic;Functional basic Safety/Judgment: Impaired Comments: Recommending 24/7 supervision due to cognitive deficits for patient's safety and reduced fall risk Sensation Sensation Light Touch: Appears Intact Hot/Cold: Appears Intact Coordination Gross Motor Movements are Fluid and Coordinated: No Fine Motor Movements are Fluid and Coordinated: Yes Coordination and Movement Description: slight coordination impairment L LE, much improved since admission Finger Nose Finger Test:   WFL Motor  Motor Motor: Hemiplegia Motor - Discharge Observations: L LE weakness improving, intermittent reduced foot clearance with fatigue Mobility  Bed Mobility Bed Mobility: Rolling Right;Rolling Left;Supine to Sit;Sit to Supine Rolling Right: Independent Rolling Left: Independent Supine to Sit: Independent Sit to Supine: Independent Transfers Sit to Stand: Independent Stand to Sit: Independent  Trunk/Postural Assessment  Cervical Assessment Cervical Assessment: Within Functional Limits Thoracic Assessment Thoracic Assessment: Within Functional Limits Lumbar Assessment Lumbar Assessment: Within Functional Limits Postural Control Postural Control: Within Functional Limits  Balance Balance Balance Assessed: Yes Static Sitting Balance Static Sitting - Balance Support: No upper extremity supported;Feet supported Static Sitting - Level of Assistance: 7: Independent Dynamic Sitting Balance Dynamic Sitting - Balance Support: Feet supported;No upper extremity supported Dynamic Sitting - Level of Assistance: 7: Independent Static Standing Balance Static Standing - Balance Support: No upper extremity supported Static Standing - Level of Assistance: 7: Independent Dynamic Standing Balance Dynamic Standing - Balance Support: No upper extremity supported;During functional activity Dynamic Standing - Level of Assistance: 7:  Independent Extremity/Trunk Assessment RUE Assessment RUE Assessment: Within Functional Limits LUE Assessment LUE Assessment: Within Functional Limits   Curtis Sites 12/31/2020, 10:51 AM

## 2020-12-31 NOTE — Progress Notes (Addendum)
Patient ID: Matthew Francis, male   DOB: Aug 31, 1960, 60 y.o.   MRN: [id number]  SW spoke with pt aunt [name]  [phone]) briefly, and she reported she will f/u with SW later on today since she has a funeral to attend.   SW confirmed with Courtney/[employer] confirming that pt application was received.   Outpatient PT/OT/SLP referral sent to [facility] ([phone]/f:[phone]).   SW met with pt in room to inform on outpatient location, and provide [facility] application.   Loralee Pacas, MSW, [place] Office: [phone] Cell: [phone] Fax: [phone]

## 2020-12-31 NOTE — Progress Notes (Signed)
Physical Therapy Discharge Summary  Patient Details  Name: Matthew Francis MRN: [id number] Date of Birth: Jan 26, 1961  Today's Date: 12/31/2020 PT Individual Time: 1300-1410 PT Individual Time Calculation (min): 70 min   Patient has met 9 of 9 long term goals due to improved activity tolerance, improved balance, improved postural control, increased strength, ability to compensate for deficits, improved attention, improved awareness, and improved coordination.  Patient to discharge at an ambulatory level Supervision.   Patient's care partner unavailable for family training, provided d/c instructions and patient able to teach back d/c recommendations to teach family at d/c to provide the necessary cognitive assistance at discharge.  Reasons goals not met: n/a  Recommendation:  Patient will benefit from ongoing skilled PT services in outpatient setting to continue to advance safe functional mobility, address ongoing impairments in balance, R side strength/coordination, dynamic gait training, activity tolerance, community reintegration, path finding, dual task training, patient/caregiver education, and minimize fall risk.  Equipment: No equipment provided  Reasons for discharge: treatment goals met  Patient/family agrees with progress made and goals achieved: Yes  Skilled Therapeutic Intervention: Patient sitting up in bed upon PT arrival. Patient alert and agreeable to PT session. Patient denied pain during session.  Therapeutic Activity: Bed Mobility: Patient performed rolling R/L and supine to/from sit independently. Transfers: Patient performed transfers from various household surfaces independently without an AD. Patient performed a simulated sedan height car transfer with supervision without an AD. Provided min cues for safe technique. Gait Training:  Patient ambulated around the unit independently without LOB. Demonstrated improved recall and path finding to locate various gyms and his  room during session. Patient ascended/descended 12 steps without use of rails with supervision for safety. Performed reciprocal gait pattern.  Patient ambulated up/down a ramp, over 10 feet of mulch (unlevel surface), and up/down a curb to simulate community ambulation over unlevel surfaces with supervision for safety without AD. Recommended patient have supervision with someone within arms reach for safety in community environment, patient in agreement.  Neuromuscular Re-ed: Patient performed the [facility] Test and Functional Gait Assessment, see details under Balance below.  Patient demonstrates low fall risk as noted by score of 56/56 on Berg Balance Scale.  (<36= high risk for falls, close to 100%; 37-45 significant >80%; 46-51 moderate >50%; 52-55 lower >25%) Patient demonstrates low fall risk as noted by score of 28/30 on  Functional Gait Assessment.  (<19=increased fall risk with dynamic gait)   Therapeutic Exercise: 6 Min Walk Test:  Instructed patient to ambulate as quickly and as safely as possible for 6 minutes using LRAD. Patient was allowed to take standing rest breaks without stopping the test, but if the patient required a sitting rest break the clock would be stopped and the test would be over.  Results: 1200 feet (365.8 meters, Avg speed 1.01 m/s) independent without AD, RPE 4/10 at end of test. Results indicate that the patient has reduced endurance with ambulation compared to age matched norms (89).  Patient with reduced distance from previous testing due to improved impulse control and safety awareness.  Patient performed the following 1 set of the following activities provided as HEP with handout: Access Code: [email or web address] Walking with Head Rotation - 3 x daily - 7 x weekly - 1 sets - 5 reps Heel Walking - 3 x daily - 7 x weekly - 2 sets - 2 reps Braided Sidestepping - 3 x daily - 7 x weekly - 1 sets - 5 reps Walking with Head Nod -  3 x daily - 7 x weekly - 1 sets - 5  reps Patient Education walking program, d/c instructions instructions provided with HEP for family  Educated patient on all testing results and interpretation of scores during session.  Patient sitting EOB at end of session with breaks locked and all needs within reach. Per discussion with OT and LPN, made patient independent in the room at end of session. Educated on use of shoes or non-skid socks when OOB for safety, not ambulating in the halls without staff, and placed sign outside patient's door to alert staff that patient is independent in the room at this time. Patient in agreement and appreciative for increased independence. Charge nurse alerted to removal of Telesitter at end of session.   PT Discharge Precautions/Restrictions Restrictions Weight Bearing Restrictions: No Vision/Perception  Perception Perception: Impaired Spatial Orientation: significant challenges with attending to environment, pathfinding, and recall for locating objects in space or orientation of himself to objects or locations Praxis Praxis: Intact  Cognition Overall Cognitive Status: Impaired/Different from baseline Arousal/Alertness: Awake/alert Sustained Attention: Impaired Sustained Attention Impairment: Verbal complex;Functional complex Memory: Impaired Memory Impairment: Storage deficit;Retrieval deficit;Decreased recall of new information Awareness: Impaired Awareness Impairment: Emergent impairment Behaviors: Impulsive;Poor frustration tolerance Safety/Judgment: Impaired Comments: Recommending 24/7 supervision due to cognitive deficits for patient's safety and reduced fall risk Sensation Sensation Light Touch: Appears Intact Proprioception: Appears Intact Coordination Gross Motor Movements are Fluid and Coordinated: No Fine Motor Movements are Fluid and Coordinated: No Coordination and Movement Description: slight coordination impairment L LE, much improved since admission Motor   Motor Motor: Hemiplegia Motor - Discharge Observations: L LE weakness improving, intermittent reduced foot clearance with fatigue  Mobility Bed Mobility Bed Mobility: Rolling Right;Rolling Left;Supine to Sit;Sit to Supine Rolling Right: Independent Rolling Left: Independent Supine to Sit: Independent Sit to Supine: Independent Transfers Transfers: Sit to Stand;Stand to Sit;Stand Pivot Transfers Sit to Stand: Independent Stand to Sit: Independent Stand Pivot Transfers: Independent Transfer (Assistive device): None Locomotion  Gait Ambulation: Yes Gait Assistance: Independent Gait Distance (Feet): 1200 Feet (during 6 min walk test) Assistive device: None Gait Gait: Yes Gait Pattern: Within Functional Limits Gait Pattern: Step-through pattern;Decreased stance time - left;Decreased weight shift to left (minimal gait deviations) Gait velocity: slightly decreased, avg 1.01 m/s during six min walk test Stairs / Additional Locomotion Stairs: Yes Stairs Assistance: Supervision/Verbal cueing Stair Management Technique: No rails Number of Stairs: 12 Height of Stairs: 6 Ramp: Supervision/Verbal cueing Curb: Supervision/Verbal cueing Wheelchair Mobility Wheelchair Mobility: No  Trunk/Postural Assessment  Cervical Assessment Cervical Assessment: Within Functional Limits Thoracic Assessment Thoracic Assessment: Within Functional Limits Lumbar Assessment Lumbar Assessment: Within Functional Limits Postural Control Postural Control: Within Functional Limits  Balance Standardized Balance Assessment Standardized Balance Assessment: Berg Balance Test;Functional Gait Assessment Berg Balance Test Sit to Stand: Able to stand without using hands and stabilize independently Standing Unsupported: Able to stand safely 2 minutes Sitting with Back Unsupported but Feet Supported on Floor or Stool: Able to sit safely and securely 2 minutes Stand to Sit: Sits safely with minimal use of  hands Transfers: Able to transfer safely, minor use of hands Standing Unsupported with Eyes Closed: Able to stand 10 seconds safely Standing Ubsupported with Feet Together: Able to place feet together independently and stand 1 minute safely From Standing, Reach Forward with Outstretched Arm: Can reach confidently >25 cm (10") From Standing Position, Pick up Object from Floor: Able to pick up shoe safely and easily From Standing Position, Turn to Look Behind Over each  Shoulder: Looks behind from both sides and weight shifts well Turn 360 Degrees: Able to turn 360 degrees safely in 4 seconds or less Standing Unsupported, Alternately Place Feet on Step/Stool: Able to stand independently and safely and complete 8 steps in 20 seconds Standing Unsupported, One Foot in Front: Able to place foot tandem independently and hold 30 seconds Standing on One Leg: Able to lift leg independently and hold > 10 seconds Total Score: 56 Static Sitting Balance Static Sitting - Balance Support: No upper extremity supported;Feet supported Static Sitting - Level of Assistance: 7: Independent Dynamic Sitting Balance Dynamic Sitting - Balance Support: Feet supported;No upper extremity supported Dynamic Sitting - Level of Assistance: 7: Independent Static Standing Balance Static Standing - Balance Support: No upper extremity supported Static Standing - Level of Assistance: 7: Independent Dynamic Standing Balance Dynamic Standing - Balance Support: No upper extremity supported;During functional activity Dynamic Standing - Level of Assistance: 7: Independent Functional Gait  Assessment Gait Level Surface: Walks 20 ft in less than 5.5 sec, no assistive devices, good speed, no evidence for imbalance, normal gait pattern, deviates no more than 6 in outside of the 12 in walkway width. Change in Gait Speed: Able to smoothly change walking speed without loss of balance or gait deviation. Deviate no more than 6 in outside of  the 12 in walkway width. Gait with Horizontal Head Turns: Performs head turns smoothly with slight change in gait velocity (eg, minor disruption to smooth gait path), deviates 6-10 in outside 12 in walkway width, or uses an assistive device. Gait with Vertical Head Turns: Performs task with slight change in gait velocity (eg, minor disruption to smooth gait path), deviates 6 - 10 in outside 12 in walkway width or uses assistive device Gait and Pivot Turn: Pivot turns safely within 3 sec and stops quickly with no loss of balance. (mild dizziness, mild L LOB, able to self correct) Step Over Obstacle: Is able to step over 2 stacked shoe boxes taped together (9 in total height) without changing gait speed. No evidence of imbalance. Gait with Narrow Base of Support: Is able to ambulate for 10 steps heel to toe with no staggering. Gait with Eyes Closed: Walks 20 ft, no assistive devices, good speed, no evidence of imbalance, normal gait pattern, deviates no more than 6 in outside 12 in walkway width. Ambulates 20 ft in less than 7 sec. Ambulating Backwards: Walks 20 ft, no assistive devices, good speed, no evidence for imbalance, normal gait Steps: Alternating feet, no rail. Total Score: 28 Extremity Assessment  RLE Assessment RLE Assessment: Within Functional Limits Active Range of Motion (AROM) Comments: WFL RLE Strength RLE Overall Strength: Within Functional Limits for tasks assessed RLE Overall Strength Comments: Grossly in sitting 5/5 throughout LLE Assessment LLE Assessment: Within Functional Limits Active Range of Motion (AROM) Comments: [facility] General Strength Comments: Grossly in sitting 5/5 throughout, except hip flexion 4+/5    Nikolaus Pienta L Jaysie Benthall PT, DPT  12/31/2020,  3:45 PM

## 2021-01-01 DIAGNOSIS — E119 Type 2 diabetes mellitus without complications: Secondary | ICD-10-CM

## 2021-01-01 LAB — GLUCOSE, CAPILLARY: Glucose-Capillary: 121 mg/dL — ABNORMAL HIGH (ref 70–99)

## 2021-01-01 NOTE — Progress Notes (Signed)
Patient discharged off of unit with all belongings. Discharge papers/instructions explained by physician assistant to family. Patient and family have no further questions at time of discharge. No complications noted at this time.  [name] L [name]  

## 2021-01-01 NOTE — Progress Notes (Signed)
Patient ID: Matthew Francis, male   DOB: 03-04-1961, 60 y.o.   MRN: [id number]  SW received phone call from pt aunt [name] to review discharge. SW informed on [facility] referral as discussed earlier, and [facility] [place] application submitted. She confirms that the d/c plan is for patient's husband to stay with him. SW reiterated the importance of 24/7 care.   Loralee Pacas, MSW, [place] Office: [phone] Cell: [phone] Fax: [phone]

## 2021-01-01 NOTE — Progress Notes (Signed)
Inpatient Rehabilitation Care Coordinator Discharge Note  The overall goal for the admission was met for:   Discharge location: Yes. D/c to home with support from family.  Length of Stay: Yes 10 days.  Discharge activity level: Yes. Supervision.   Home/community participation: Yes. Limited.   Services provided included: MD, RD, PT, OT, SLP, RN, CM, TR, Pharmacy, Neuropsych, and SW  Financial Services: Private Insurance: [place] offered to/list presented to:Yes  Follow-up services arranged: Outpatient: [facility] for PT/OT/SLP and DME: None needed  Comments (or additional information):  Patient/Family verbalized understanding of follow-up arrangements: Yes  Individual responsible for coordination of the follow-up plan: Contact pt aunt [name] # [phone]  Confirmed correct DME delivered: Rana Snare 01/01/2021    Rana Snare

## 2021-01-01 NOTE — Progress Notes (Signed)
PROGRESS NOTE   Subjective/Complaints: No new complaints. Very anxious to get home!Elenor Legato at bedside. Pt feels prepared. Asked if he could drive. Headaches better  ROS: Patient denies fever, rash, sore throat, blurred vision, nausea, vomiting, diarrhea, cough, shortness of breath or chest pain, joint or back pain,  or mood change.     Objective:   No results found. No results for input(s): WBC, HGB, HCT, PLT in the last 72 hours.  No results for input(s): NA, K, CL, CO2, GLUCOSE, BUN, CREATININE, CALCIUM in the last 72 hours.   Intake/Output Summary (Last 24 hours) at 01/01/2021 0916 Last data filed at 01/01/2021 0100 Gross per 24 hour  Intake 474 ml  Output 700 ml  Net -226 ml        Physical Exam: Vital Signs Blood pressure 112/77, pulse 75, temperature 98.5 F (36.9 C), resp. rate 16, height 5' 11"  (1.803 m), weight 90 kg, SpO2 99 %.    Constitutional: No distress . Vital signs reviewed. HEENT: EOMI, oral membranes moist Neck: supple Cardiovascular: RRR without murmur. No JVD    Respiratory/Chest: CTA Bilaterally without wheezes or rales. Normal effort    GI/Abdomen: BS +, non-tender, non-distended Ext: no clubbing, cyanosis, or edema Psych: pleasant and cooperative, a little more wound up today Skin: intact Neurological:    Mental Status: He is alert.    Comments: appropriate. Improving awareness and insight. Still with STM deficits. Normal speech, language.   Moves all 4 limbs. Mild left PD and decreased FMC/apraxia LUE and LLE ongoing. Senses pain and LT in all 4's. No focal CN findings.     Assessment/Plan: 1. Functional deficits which require 3+ hours per day of interdisciplinary therapy in a comprehensive inpatient rehab setting. Physiatrist is providing close team supervision and 24 hour management of active medical problems listed below. Physiatrist and rehab team continue to assess barriers to  discharge/monitor patient progress toward functional and medical goals  Care Tool:  Bathing    Body parts bathed by patient: Right arm, Left arm, Chest, Abdomen, Front perineal area, Buttocks, Right upper leg, Left upper leg, Right lower leg, Face, Left lower leg   Body parts bathed by helper: Left lower leg     Bathing assist Assist Level: Set up assist     Upper Body Dressing/Undressing Upper body dressing   What is the patient wearing?: Pull over shirt    Upper body assist Assist Level: Independent    Lower Body Dressing/Undressing Lower body dressing      What is the patient wearing?: Underwear/pull up     Lower body assist Assist for lower body dressing: Independent     Toileting Toileting    Toileting assist Assist for toileting: Independent     Transfers Chair/bed transfer  Transfers assist     Chair/bed transfer assist level: Independent     Locomotion Ambulation   Ambulation assist      Assist level: Independent Assistive device: No Device Max distance: 1200 ft   Walk 10 feet activity   Assist     Assist level: Independent Assistive device: No Device   Walk 50 feet activity   Assist    Assist  level: Independent Assistive device: No Device    Walk 150 feet activity   Assist    Assist level: Independent Assistive device: No Device    Walk 10 feet on uneven surface  activity   Assist Walk 10 feet on uneven surfaces activity did not occur: Safety/medical concerns   Assist level: Supervision/Verbal cueing     Wheelchair     Assist Will patient use wheelchair at discharge?: No   Wheelchair activity did not occur: N/A         Wheelchair 50 feet with 2 turns activity    Assist    Wheelchair 50 feet with 2 turns activity did not occur: N/A       Wheelchair 150 feet activity     Assist  Wheelchair 150 feet activity did not occur: N/A       Blood pressure 112/77, pulse 75, temperature 98.5  F (36.9 C), resp. rate 16, height 5' 11"  (1.803 m), weight 90 kg, SpO2 99 %.    Medical Problem List and Plan: 1.  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  Antithrombotics: -DVT/anticoagulation: SCDs             -antiplatelet therapy: N/A 3. Pain Management: Robaxin as needed, Tylenol as needed  -pt told me headaches are gradually improving  -continue low dose topamax but change to hs given daytime fatigue  controlled 4. Mood: Provide emotional support             -antipsychotic agents: Risperdal   0.5m bid for now   -prn seroquel for sleep     5. Neuropsych: This patient is not capable of making decisions on his own behalf. 6. Skin/Wound Care: Routine skin checks 7. Fluids/Electrolytes/Nutrition: double portions  I personally reviewed the patient's labs today.    7/12 -mild hyponatremia--improved 7/13  -AST sl elevated. Likely reactive, LFT's improved    8.  Hypertension.  Clonidine 01 mg daily, Norvasc 10 mg daily, hydralazine 100 mg twice daily.  Monitor with increased mobility             -BP under control at present 7/18 9.  Ulcerative colitis.Xifaxan 550 mg twice daily, Entocort 9 mg daily             -simethicone for gas/bloating  - added fiber and prn metamucil for loose stool  -stools improved. I increased his protonix to bid for "acid" 10.  GNR found 1 blood culture 12/16/2020.  Placed on empiric Rocephin 12/18/2020             -cx + diptheroids---dc abx, spoke with pharmacy 11.  AKI/?CKD.  Cr 1.51---> 1.48 7/11 12.  Type 2 diabetes mellitus.  Hemoglobin A1c 5.9.  SSI. CBGs 113-190. Cr 1.51-   -consider resuming metformin if needed and Cr normal  CBG (last 3) --controlled 7/15 Recent Labs    12/31/20 1156 12/31/20 2140 01/01/21 0720  GLUCAP 180* 139* 121*    7/18- f/u cbg's on outpt  basis 13. Fatigue: - improved, likely d/t risperdal   .    LOS: 11 days A FACE TO FACE EVALUATION WAS PERFORMED  ZMeredith Staggers7/19/2022, 9:16 AM

## 2021-01-02 ENCOUNTER — Encounter: Payer: Self-pay | Admitting: Registered Nurse

## 2021-01-03 ENCOUNTER — Ambulatory Visit: Payer: 59 | Admitting: Physical Therapy

## 2021-01-03 ENCOUNTER — Ambulatory Visit: Payer: 59

## 2021-01-03 ENCOUNTER — Encounter: Payer: Self-pay | Admitting: Physical Therapy

## 2021-01-03 ENCOUNTER — Other Ambulatory Visit: Payer: Self-pay

## 2021-01-03 DIAGNOSIS — R41841 Cognitive communication deficit: Secondary | ICD-10-CM | POA: Insufficient documentation

## 2021-01-03 DIAGNOSIS — R2689 Other abnormalities of gait and mobility: Secondary | ICD-10-CM

## 2021-01-03 DIAGNOSIS — R41842 Visuospatial deficit: Secondary | ICD-10-CM | POA: Insufficient documentation

## 2021-01-03 DIAGNOSIS — M6281 Muscle weakness (generalized): Secondary | ICD-10-CM

## 2021-01-03 DIAGNOSIS — R41844 Frontal lobe and executive function deficit: Secondary | ICD-10-CM | POA: Insufficient documentation

## 2021-01-03 DIAGNOSIS — R41840 Attention and concentration deficit: Secondary | ICD-10-CM | POA: Insufficient documentation

## 2021-01-03 DIAGNOSIS — R278 Other lack of coordination: Secondary | ICD-10-CM | POA: Insufficient documentation

## 2021-01-03 DIAGNOSIS — I10 Essential (primary) hypertension: Secondary | ICD-10-CM | POA: Diagnosis present

## 2021-01-03 DIAGNOSIS — F1910 Other psychoactive substance abuse, uncomplicated: Secondary | ICD-10-CM | POA: Diagnosis present

## 2021-01-03 DIAGNOSIS — R208 Other disturbances of skin sensation: Secondary | ICD-10-CM | POA: Insufficient documentation

## 2021-01-03 NOTE — Therapy (Signed)
[facility] [address] [place], [place], [address] Phone: [phone]   Fax:  [phone]  Speech Language Pathology Evaluation  Patient Details  Name: Matthew Francis MRN: [id number] Date of Birth: 09/01/1960 Referring Provider (SLP): Lauraine Rinne, [place]   Encounter Date: 01/03/2021   End of Session - 01/03/21 1242     Visit Number 1    Number of Visits 17    Date for SLP Re-Evaluation 03/08/21    SLP Start Time 1110   pt 10 mintues late   SLP Stop Time  1145    SLP Time Calculation (min) 35 min    Activity Tolerance Patient tolerated treatment well             Past Medical History:  Diagnosis Date   Arthritis    GERD (gastroesophageal reflux disease)    H/O ulcerative colitis    Hyperlipidemia    Hypertension    Inguinal hernia    Ulcer    ulcerative colitis   Ulcerative colitis    Wears glasses     Past Surgical History:  Procedure Laterality Date   APPENDECTOMY     COLECTOMY     Proctocolectomy with IPAA   COLECTOMY     FOREIGN BODY REMOVAL ABDOMINAL  02/17/2012   Procedure: REMOVAL FOREIGN BODY ABDOMINAL;  Surgeon: Odis Hollingshead, MD;  Location: [name];  Service: General;  Laterality: N/A;  abdominal wound exploration and removal of foreign body   HERNIA REPAIR     Multiple incisional hernias.   ileoanal anastomosis      There were no vitals filed for this visit.   Subjective Assessment - 01/03/21 1147     Subjective "I didn't really do much (in ST on CIR)."    Currently in Pain? No/denies                SLP Evaluation [facility] - 01/03/21 1147       SLP Visit Information   SLP Received On 01/03/21    Referring Provider (SLP) Lauraine Rinne, PA    Onset Date 12-11-20    Medical Diagnosis rt basal ganglia CVA      Subjective   Patient/Family Stated Goal return to PLOF      General Information   HPI 60 yo adm to [facility] with IVH/hemmorhagic  CVA/hydrocephalus.  His hospital corrse has been complicated by extension of CVA, cerebral edema, brain herniation, cocaine w/d, with frequent agitation and pain.  Brain imaging 12/17/2020 showed stable appearance of the right basal ganglia hemorrhage with intraventricular extension and no worsening of hydrocephalus. Pt rec'd ST on CIR- SLUMS score on 12-22-20 was 16/30.      Prior Functional Status   Cognitive/Linguistic Baseline Within functional limits    Type of Home House     Lives With Alone    Available Support Family    Vocation Full time employment   pt works Academic librarian for [employer]     Cognition   Overall Cognitive Status Impaired/Different from baseline    Area of Impairment Memory;Awareness   further testing needed   Memory Comments Pt repeated statements to SLP x3 during the evaluation, and Decklan could not tell me the three items in the COVID test kit that he has done QC with. He did not know what medication prescribed at d/c was for. Pt states he has not missed or doubled a med dose but is not using a Fish farm manager. SLP suggested  one for pt to use - he stated he had one and would "dust if off" and begin to use it.    Safety and Judgement Comments --    Awareness Comments Pt did not mention deficits in memory until asked by SLP directly if he was having difficulty with memory.      Auditory Comprehension   Overall Auditory Comprehension Appears within functional limits for tasks assessed      Verbal Expression   Overall Verbal Expression Appears within functional limits for tasks assessed      Motor Speech   Overall Motor Speech Appears within functional limits for tasks assessed                             SLP Education - 01/03/21 1241     Education Details evaluation results - pt with trouble with memory, SLP to complete full eval next sesison, recommend use of a med Insurance account manager) Educated Patient    Methods Explanation    Comprehension  Verbalized understanding              SLP Short Term Goals - 01/03/21 1246       SLP SHORT TERM GOAL #1   Title complete formal cognitive testing    Time 2    Period --   sessions   Status New    Target Date 01/18/21      SLP SHORT TERM GOAL #2   Title pt will develop a memory compensation system of choice and use for appointment tracking, and/or medication administration    Time 4    Period Weeks    Status New    Target Date 02/01/21              SLP Long Term Goals - 01/03/21 1248       SLP LONG TERM GOAL #1   Title Pt will use memory compensations for tracking medication, appointment tracking/scheduling, to-do lists, etc between 6 sessions    Time 8    Period Weeks    Status New    Target Date 03/08/21      SLP LONG TERM GOAL #2   Title pt will demonstrate divided attentoin between a simple linguistic task and another task for 10 minutes, in 3 sessions    Time 8    Period Weeks    Status New    Target Date 03/08/21              Plan - 01/03/21 1243     Clinical Impression Statement Leshaun presents today with some deficits in short term memory as he repeated information to SLP x3 during eval, oculd not tell me what his new meds prescribed at d/c were for, and req'd mod-max A for telling SLP what 3 items were in 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 Simple alternating attention with showing SLP meds that were on his med list was essentially WNL. Full cognitive eval to follow in first 1-2 sessions. Pt would benefit from skilled ST targeting cognitive linguistic deficits.    Speech Therapy Frequency 2x / week    Duration 8 weeks    Treatment/Interventions Functional tasks;Compensatory techniques;Cueing hierarchy;SLP instruction and feedback;Patient/family education;Internal/external aids    Potential to Achieve Goals Good    Consulted and Agree with Plan of Care Patient             Patient will benefit  from skilled  therapeutic intervention in order to improve the following deficits and impairments:   Cognitive communication deficit    Problem List Patient Active Problem List   Diagnosis Date Noted   Hypertensive emergency 71/27/8718   Acute metabolic encephalopathy 36/72/5500   ICH (intracerebral hemorrhage) ([facility]) 12/11/2020   UTI due to extended-spectrum beta lactamase (ESBL) producing Escherichia coli 01/19/2020   E coli bacteremia 01/18/2020   Severe sepsis ([facility]) 01/17/2020   AKI (acute kidney injury) ([facility]) 01/17/2020   Thrombocytopenia ([facility]) 01/17/2020   Essential hypertension 01/17/2020   Ulcerative colitis ([facility]) 01/17/2020   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 02/09/2012    Kierre Deines ,Ju[place] Diaz, [place]  01/03/2021, 12:50 PM  [facility] [address] [place], [place], [address] Phone: [phone]   Fax:  [phone]  Name: EDMOND GINSBERG MRN: [id number] Date of Birth: [birth year]

## 2021-01-03 NOTE — Therapy (Signed)
[facility] [address] [place], [place], [address] Phone: [phone]   Fax:  [phone]  Physical Therapy Evaluation  Patient Details  Name: Matthew Francis MRN: [id number] Date of Birth: Feb 08, 1961 Referring Provider (PT): Cathlyn Parsons, PA-C   Encounter Date: 01/03/2021   PT End of Session - 01/03/21 1148     Visit Number 1    Number of Visits 7    Date for PT Re-Evaluation 02/14/21    Authorization Type [employer]    PT Start Time 1150    PT Stop Time 1230    PT Time Calculation (min) 40 min    Equipment Utilized During Treatment Gait belt    Activity Tolerance Patient tolerated treatment well    Behavior During Therapy WFL for tasks assessed/performed             Past Medical History:  Diagnosis Date   Arthritis    GERD (gastroesophageal reflux disease)    H/O ulcerative colitis    Hyperlipidemia    Hypertension    Inguinal hernia    Ulcer    ulcerative colitis   Ulcerative colitis    Wears glasses     Past Surgical History:  Procedure Laterality Date   APPENDECTOMY     COLECTOMY     Proctocolectomy with IPAA   COLECTOMY     FOREIGN BODY REMOVAL ABDOMINAL  02/17/2012   Procedure: REMOVAL FOREIGN BODY ABDOMINAL;  Surgeon: Odis Hollingshead, MD;  Location: [facility];  Service: General;  Laterality: N/A;  abdominal wound exploration and removal of foreign body   HERNIA REPAIR     Multiple incisional hernias.   ileoanal anastomosis      There were no vitals filed for this visit.    Subjective Assessment - 01/03/21 1158     Subjective Pt reports he had a stroke in June. Pt states he was at home when it occurred after using illicit drugs. Pt was in the hospital 28 to 30 days and worked hard with therapy. Pt states when he returned home he has had a new outlook on life and is no longer using any drugs. Pt states he feels a little weakness in the L leg and tightness.  Reports no issues with his balance but is using RW just in case. He does note one LOB after return home -- he is not sure what caused it. Reports some R hand numbness and change in his vision.    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 no issues    How long can you stand comfortably? no issues    How long can you walk comfortably? ~15 min before L LE gets too tired    Patient Stated Goals Return to work and normal function    Currently in Pain? No/denies                [facility] PT Assessment - 01/03/21 1148       Assessment   Medical Diagnosis acute hemorrhage within the right caudate basal ganglia with hemorrhage in the right lateral third and fourth ventricle    Referring Provider (PT) Cathlyn Parsons, PA-C    Hand Dominance Left;Right   Writes with the L, uses R hand for dexterity     Precautions   Precautions None      Restrictions  Weight Bearing Restrictions No      Balance Screen   Has the patient fallen in the past 6 months No      [place] residence    Living Arrangements Alone    Available Help at Discharge Neighbor;Family    Type of [place] to enter    Entrance Stairs-Number of Steps [place] One level    [place] - 2 wheels      Prior Function   Level of [place] Requirements 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      Observation/Other Assessments   Focus on [place])  not captured      Sensation   Light Touch --   R hand numbness     Coordination   Gross Motor Movements are Fluid and Coordinated Yes      ROM / Strength   AROM / PROM / Strength Strength      Strength   Overall Strength Within functional limits for tasks performed   on MMT grossly 5/5  bilat   Overall Strength Comments Leg press: R 110# x10; on L 110# x 7      Transfers   Five time sit to stand comments  7 sec      Standardized Balance Assessment   Standardized Balance Assessment Mini-BESTest      Mini-BESTest   Sit To Stand Normal: Comes to stand without use of hands and stabilizes independently.    Rise to Toes Normal: Stable for 3 s with maximum height.    Stand on one leg (left) Normal: 20 s.    Stand on one leg (right) Normal: 20 s.   Small listing   Stand on one leg - lowest score 2    Compensatory Stepping Correction - Forward Moderate: More than one step is required to recover equilibrium   2   Compensatory Stepping Correction - Backward Moderate: More than one step is required to recover equilibrium   2   Compensatory Stepping Correction - Left Lateral Moderate: Several steps to recover equilibrium   2   Compensatory Stepping Correction - Right Lateral Moderate: Several steps to recover equilibrium   2   Stepping Corredtion Lateral - lowest score 1    Stance - Feet together, eyes open, firm surface  Normal: 30s    Stance - Feet together, eyes closed, foam surface  Normal: 30s    Incline - Eyes Closed Normal: Stands independently 30s and aligns with gravity    Change in Gait Speed Normal: Significantly changes walkling speed without imbalance    Walk with head turns - Horizontal Normal: performs head turns with no change in gait speed and good balance    Walk with pivot turns Normal: Turns with feet close FAST (< 3 steps) with good balance.    Step over obstacles Normal: Able to step over box with minimal change of gait speed and with good balance.    Timed UP & GO with Dual Task Moderate: Dual Task affects either counting OR walking (>10%) when compared to the TUG without Dual Task.   9 sec; 13 sec   Mini-BEST total score 24                        Objective measurements completed on examination: See above findings.  PT Education - 01/03/21 1251     Education Details Discussed exam findings and POC    Person(s) Educated Patient    Methods Explanation;Verbal cues    Comprehension Verbalized understanding;Verbal cues required                 PT Long Term Goals - 01/03/21 1301       PT LONG TERM GOAL #1   Title Pt will be independent with HEP and transition to community wellness    Time 6    Period Weeks    Status New    Target Date 02/14/21      PT LONG TERM GOAL #2   Title Pt will be able to demo L = R LE strength when using leg press    Baseline see Strength    Time 6    Period Weeks    Status New    Target Date 02/14/21      PT LONG TERM GOAL #3   Title Pt will have 28/28 on mini BESTest    Baseline 24/28    Time 6    Period Weeks    Status New    Target Date 02/14/21                    Plan - 01/03/21 1252     Clinical Impression Statement Matthew Francis is a 60 y/o M presenting to OPPT s/p CVA in R basal ganglia. On assessment, pt notes L>R LE weakness/decreased endurance and mild decrease in balance. Pt's mini BESTest and previous FGA from inpatient rehab indicates low fall risk; however, demos need to improve compensatory balance and higher level balance tests for safer community mobility. Pt would benefit from PT to return him to prior level of function.    Personal Factors and Comorbidities Age;Fitness;Time since onset of injury/illness/exacerbation    Examination-Activity Limitations Locomotion Level;Caring for Others;Carry    Examination-Participation Restrictions Cleaning;Occupation;Yard Work;Driving    Stability/Clinical Decision Making Stable/Uncomplicated    Clinical Decision Making Low    Rehab Potential Excellent    PT Frequency 1x / week    PT Duration 6 weeks    PT Treatment/Interventions ADLs/Self Care Home Management;Aquatic Therapy;Cryotherapy;Moist Heat;Gait training;Stair training;Functional mobility training;Therapeutic  activities;Therapeutic exercise;Balance training;Neuromuscular re-education;Manual techniques;Patient/family education;Orthotic Fit/Training;Dry needling;Taping    PT Next Visit Plan Work on high level balance -- single leg stance, foam, compensatory balance strategies. Strengthening -- consider elliptical, single leg sit<>stands/wall slides/squats, squats. Endurance with walking (try unlevel surfaces outside).    Recommended Other Services OT eval -- vision and R hand    Consulted and Agree with Plan of Care Patient             Patient will benefit from skilled therapeutic intervention in order to improve the following deficits and impairments:  Decreased endurance, Decreased activity tolerance, Decreased balance, Decreased strength, Decreased mobility  Visit Diagnosis: Muscle weakness (generalized)  Other abnormalities of gait and mobility     Problem List Patient Active Problem List   Diagnosis Date Noted   Hypertensive emergency 16/03/9603   Acute metabolic encephalopathy 54/02/8118   ICH (intracerebral hemorrhage) ([facility]) 12/11/2020   UTI due to extended-spectrum beta lactamase (ESBL) producing Escherichia coli 01/19/2020   E coli bacteremia 01/18/2020   Severe sepsis ([facility]) 01/17/2020   AKI (acute kidney injury) ([facility]) 01/17/2020   Thrombocytopenia ([facility]) 01/17/2020   Essential hypertension 01/17/2020   Ulcerative colitis ([facility]) 01/17/2020   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  02/09/2012    Livana Yerian April Ma L Georgeanne Frankland PT, DPT 01/03/2021, 1:07 PM  [facility] [address] [place], [place], [address] Phone: [phone]   Fax:  [phone]  Name: Matthew Francis MRN: [id number] Date of Birth: May 18, 1961

## 2021-01-07 ENCOUNTER — Telehealth: Payer: Self-pay | Admitting: Physical Medicine & Rehabilitation

## 2021-01-07 NOTE — Telephone Encounter (Signed)
Dr Naaman Plummer patient left voicemail at 8:03 that he is having an allergic reaction -bad whelps and itching rash [phone]-- didn't know which medication

## 2021-01-07 NOTE — Telephone Encounter (Signed)
Spoke to patient. I think it's the new detergent he's using. He's not any new meds and in fact I was tapering medications before he was discharged last week. He'll go back to his old detergent, use benadryl, hydrocortisone cream in the mean time.

## 2021-01-08 ENCOUNTER — Ambulatory Visit: Payer: 59

## 2021-01-08 ENCOUNTER — Other Ambulatory Visit: Payer: Self-pay

## 2021-01-08 ENCOUNTER — Ambulatory Visit: Payer: 59 | Admitting: Occupational Therapy

## 2021-01-08 DIAGNOSIS — R41842 Visuospatial deficit: Secondary | ICD-10-CM

## 2021-01-08 DIAGNOSIS — R208 Other disturbances of skin sensation: Secondary | ICD-10-CM

## 2021-01-08 DIAGNOSIS — F1910 Other psychoactive substance abuse, uncomplicated: Secondary | ICD-10-CM | POA: Diagnosis not present

## 2021-01-08 DIAGNOSIS — R41844 Frontal lobe and executive function deficit: Secondary | ICD-10-CM

## 2021-01-08 DIAGNOSIS — R41841 Cognitive communication deficit: Secondary | ICD-10-CM

## 2021-01-08 DIAGNOSIS — R2689 Other abnormalities of gait and mobility: Secondary | ICD-10-CM

## 2021-01-08 DIAGNOSIS — R41840 Attention and concentration deficit: Secondary | ICD-10-CM

## 2021-01-08 DIAGNOSIS — R278 Other lack of coordination: Secondary | ICD-10-CM

## 2021-01-08 DIAGNOSIS — M6281 Muscle weakness (generalized): Secondary | ICD-10-CM

## 2021-01-08 NOTE — Therapy (Signed)
[facility] [address] [place], [place], [address] Phone: [phone]   Fax:  [phone]  Speech Language Pathology Treatment  Patient Details  Name: Matthew Francis MRN: [id number] Date of Birth: 1961-01-17 Referring Provider (SLP): Lauraine Rinne, [place]   Encounter Date: 01/08/2021   End of Session - 01/08/21 1155     Visit Number 2    Number of Visits 17    Date for SLP Re-Evaluation 03/08/21    SLP Start Time 67    SLP Stop Time  3419    SLP Time Calculation (min) 42 min    Activity Tolerance Patient tolerated treatment well             Past Medical History:  Diagnosis Date   Arthritis    GERD (gastroesophageal reflux disease)    H/O ulcerative colitis    Hyperlipidemia    Hypertension    Inguinal hernia    Ulcer    ulcerative colitis   Ulcerative colitis    Wears glasses     Past Surgical History:  Procedure Laterality Date   APPENDECTOMY     COLECTOMY     Proctocolectomy with IPAA   COLECTOMY     FOREIGN BODY REMOVAL ABDOMINAL  02/17/2012   Procedure: REMOVAL FOREIGN BODY ABDOMINAL;  Surgeon: Odis Hollingshead, MD;  Location: [facility];  Service: General;  Laterality: N/A;  abdominal wound exploration and removal of foreign body   HERNIA REPAIR     Multiple incisional hernias.   ileoanal anastomosis      There were no vitals filed for this visit.   Subjective Assessment - 01/08/21 1000     Subjective "There was two things - I knew I forgot one of them." (pt, re: SLP "how did it go with the medication organizer?")    Currently in Pain? 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 Difficulty with memory for instructions. Pt put med box into his notes app, for remembering to bring with him next session. SLP reiterated fact pt may have to use notes app more now than before CVA - pt agreed and said he was doing so. Antavious has OT eval today.      Assessment / Recommendations / Plan   Plan Continue with current plan of care      Progression Toward Goals   Progression toward goals Progressing toward goals              SLP Education - 01/08/21 1154     Education Details finish eval next session, pt cont to demo dificulty with memory    Person(s) Educated Patient    Methods Explanation    Comprehension Verbalized understanding              SLP Short Term Goals - 01/08/21 1157       SLP SHORT TERM GOAL #1   Title complete formal cognitive testing    Time 1    Period --   sessions  Status On-going    Target Date 01/18/21      SLP SHORT TERM GOAL #2   Title pt will develop a memory compensation system of choice and use for appointment tracking, and/or medication administration in 3 sessions    Time 4    Period Weeks    Status Revised    Target Date 02/01/21              SLP Long Term Goals - 01/08/21 1158       SLP LONG TERM GOAL #1   Title Pt will use memory compensations for tracking medication, appointment tracking/scheduling, to-do lists, etc between 7 sessions    Time 8    Period Weeks    Status Revised    Target Date 03/08/21      SLP LONG TERM GOAL #2   Title pt will demonstrate divided attentoin between a simple linguistic task and another task for 10 minutes, in 3 sessions    Time 8    Period Weeks    Status On-going    Target Date 03/08/21              Plan - 01/08/21 1156     Clinical Impression Statement [place] cont to present today with some deficits in short term memory - with instructions for trail making task. SLP  initiated Cognitive Linguistic Quick Test - full cognitive eval to be completed next session. Pt would cont to benefit from skilled ST targeting cognitive linguistic deficits.    Speech Therapy Frequency 2x / week    Duration 8 weeks    Treatment/Interventions Functional tasks;Compensatory techniques;Cueing hierarchy;SLP instruction and feedback;Patient/family education;Internal/external aids    Potential to Achieve Goals Good    Consulted and Agree with Plan of Care Patient             Patient will benefit from skilled therapeutic intervention in order to improve the following deficits and impairments:   Cognitive communication deficit    Problem List Patient Active Problem List   Diagnosis Date Noted   Hypertensive emergency 94/50/3888   Acute metabolic encephalopathy 28/00/3491   ICH (intracerebral hemorrhage) ([facility] [place]) 12/11/2020   UTI due to extended-spectrum beta lactamase (ESBL) producing Escherichia coli 01/19/2020   E coli bacteremia 01/18/2020   Severe sepsis ([facility]) 01/17/2020   AKI (acute kidney injury) ([facility]) 01/17/2020   Thrombocytopenia ([facility]) 01/17/2020   Essential hypertension 01/17/2020   Ulcerative colitis ([facility]) 01/17/2020   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 02/09/2012    Teri Legacy. ,MS, CCC-SLP  01/08/2021, 11:59 AM  [facility] [address] [place], [place], [address] Phone: [phone]   Fax:  [phone]   Name: Matthew Francis MRN: [id number] Date of Birth: 13-May-1961

## 2021-01-09 NOTE — Therapy (Signed)
[facility] [address] [place], [place], [address] Phone: [phone]   Fax:  [phone]  Occupational Therapy Evaluation  Patient Details  Name: Matthew Francis MRN: [id number] Date of Birth: 14-Apr-1961 Referring Provider (OT): Dr. Naaman Plummer   Encounter Date: 01/08/2021   OT End of Session - 01/09/21 1036     Visit Number 1    Number of Visits 20    Date for OT Re-Evaluation 03/20/21    Authorization Type [employer]    Authorization Time Period 10 weeks to account for scheduling    Authorization - Visit Number 1    Authorization - Number of Visits 20    OT Start Time 1022    OT Stop Time 1100    OT Time Calculation (min) 38 min             Past Medical History:  Diagnosis Date   Arthritis    GERD (gastroesophageal reflux disease)    H/O ulcerative colitis    Hyperlipidemia    Hypertension    Inguinal hernia    Ulcer    ulcerative colitis   Ulcerative colitis    Wears glasses     Past Surgical History:  Procedure Laterality Date   APPENDECTOMY     COLECTOMY     Proctocolectomy with IPAA   COLECTOMY     FOREIGN BODY REMOVAL ABDOMINAL  02/17/2012   Procedure: REMOVAL FOREIGN BODY ABDOMINAL;  Surgeon: Odis Hollingshead, MD;  Location: [facility];  Service: General;  Laterality: N/A;  abdominal wound exploration and removal of foreign body   HERNIA REPAIR     Multiple incisional hernias.   ileoanal anastomosis      There were no vitals filed for this visit.   Subjective Assessment - 01/08/21 1024     Subjective  Denies pain    Patient Stated Goals improve short term memory, improve hand numbness    Currently in Pain? No/denies               [facility] OT Assessment - 01/08/21 1025       Assessment   Medical Diagnosis acute hemorrhage within the right caudate basal ganglia with hemorrhage in the right lateral third and fourth ventricle    Referring Provider (OT) Dr. 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    Leisure watch TV, read      ADL   ADL comments modified independent      [place] for transportation    [place] alone or with occasional assistance    Meal Prep Able to complete simple warm meal prep   per pt report   Medication Management Is responsible for taking medication in correct dosages at correct time   has system     Mobility   Mobility Status Independent      Written Expression   Dominant Hand Left  left for hand writing     Vision - History   Baseline Vision Wears glasses all the time    Visual History Other (comment)    Patient Visual Report --   Blurriness with right eye     Vision Assessment   Ocular Range of Motion Within Functional Limits    Visual Fields No apparent deficits    Comment Pt reports blurriness in right eye no diplopia      Cognition   Area of Impairment Memory;Awareness    Memory Decreased short-term memory    Attention Selective    Selective Attention Appears intact    Memory Impaired    Memory Impairment Decreased short term memory    Cognition Comments decreased ability to multi task per pt report      Observation/Other Assessments   Focus on Therapeutic Outcomes (FOTO)  not captured      Sensation   Light Touch Impaired by gross assessment   numbness in right hand   Hot/Cold Impaired by gross assessment   discussed use of left hand for safety     Coordination   Fine Motor Movements are Fluid and Coordinated No    9 Hole Peg Test  Right;Left    Right 9 Hole Peg Test 26.13   multiple drops   Left 9 Hole Peg Test 24.03    Box and Blocks RUE 67, LUE 64      ROM / Strength   AROM / PROM / Strength AROM;Strength      AROM   Overall AROM  Within functional limits for tasks performed      Strength   Overall Strength Within functional limits for tasks performed   grossly 4+/5-5/5     Hand Function   Right Hand Grip (lbs) 77.1    Left Hand Grip (lbs) 79.5                               OT Short Term Goals - 01/09/21 1042       OT SHORT TERM GOAL #1   Title I with inital HEP    Time 4    Period Weeks    Status New    Target Date 02/06/21      OT SHORT TERM GOAL #2   Title Pt will verbalize understanding of precautions related to sensory deficits for RUE    Time 4    Period Weeks    Status New      OT SHORT TERM GOAL #3   Title Pt will demonstrate ability to cook 2 items simultaneously modified indpendently demonstrating good safety awareness.    Time 4    Period Weeks    Status New      OT SHORT TERM GOAL #4   Title Pt will perfrom mod complex envirronmental scanning task with 90% or better accuracy in prep for work and driving    Time 4    Period Weeks    Status New               OT Long Term Goals - 01/09/21 1045       OT LONG TERM GOAL #1   Title Pt will perfrom simulated work activities modified independently in a reasonable amount of time.    Time 10    Period Weeks    Status New    Target Date 03/20/21      OT LONG TERM GOAL #  2   Title Pt will demonstrate improved fine motor coordination for work activities as evidenced by decreasing  RUE 9 hole peg test score by 3 secs and perform without drops    Time 10    Period Weeks    Status New      OT LONG TERM GOAL #3   Title Pt will perfrom a physical and cognitve task simultaneously  with 90% or better accuracy in prep for driving    Time 10    Period Weeks    Status New                   Plan -  01/09/21 1225     Clinical Impression Statement Pt is a 60 y.o. male who presented to ED 6/28 with AMS, nausea, and headache. 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 PMH: arthritis, GERD, HTN, inguinal hernia, and ulcerative colitis, polysubstance abuse. Pt presents to occupational therapy with the following deficits: senssory deficits, decreased coordination, cogntive deficits, visual deficits which impedes perfromance of ADLs/ IADLs. Pt can benefit from skilled OT to address these deficits in order to maximize pt's safety and I with daily activities.    OT Occupational Profile and History Problem Focused Assessment - Including review of records relating to presenting problem    Occupational performance deficits (Please refer to evaluation for details): ADL's;IADL's;Work;Leisure;Social Participation    Body Structure / Function / Physical Skills ADL;UE functional use;Balance;Flexibility;Vision;FMC;ROM;Gait;GMC;Coordination;Decreased knowledge of precautions;Sensation;IADL;Decreased knowledge of use of DME;Dexterity;Strength;Mobility    Cognitive Skills Attention;Problem Solve;Thought    Rehab Potential Good    Clinical Decision Making Limited treatment options, no task modification necessary    Comorbidities Affecting Occupational Performance: May have comorbidities impacting occupational performance    Modification or Assistance to Complete Evaluation  No modification of tasks or assist necessary to complete eval    OT Frequency 2x / week    OT Duration --   x 10 weeks   OT Treatment/Interventions Self-care/ADL training;Ultrasound;Energy conservation;Visual/perceptual remediation/compensation;Patient/family education;DME and/or AE instruction;Aquatic Therapy;Paraffin;Passive range of motion;Balance training;Fluidtherapy;Cryotherapy;Electrical Stimulation;Splinting;Functional Mobility Training;Moist Heat;Therapeutic  exercise;Manual Therapy;Therapeutic activities;Neuromuscular education;Cognitive remediation/compensation    Plan inital HEP for coordination    Consulted and Agree with Plan of Care Patient             Patient will benefit from skilled therapeutic intervention in order to improve the following deficits and impairments:   Body Structure / Function / Physical Skills: ADL, UE functional use, Balance, Flexibility, Vision, FMC, ROM, Gait, GMC, Coordination, Decreased knowledge of precautions, Sensation, IADL, Decreased knowledge of use of DME, Dexterity, Strength, Mobility Cognitive Skills: Attention, Problem Solve, Thought     Visit Diagnosis: Other lack of coordination  Muscle weakness (generalized)  Other abnormalities of gait and mobility  Attention and concentration deficit  Frontal lobe and executive function deficit  Other disturbances of skin sensation  Visuospatial deficit    Problem List Patient Active Problem List   Diagnosis Date Noted   Hypertensive emergency 01/27/4817   Acute metabolic encephalopathy 56/31/4970   ICH (intracerebral hemorrhage) ([place]) 12/11/2020   UTI due to extended-spectrum beta lactamase (ESBL) producing Escherichia coli 01/19/2020   E coli bacteremia 01/18/2020   Severe sepsis ([place]) 01/17/2020   AKI (acute kidney injury) ([facility]) 01/17/2020   Thrombocytopenia ([place]) 01/17/2020   Essential hypertension 01/17/2020   Ulcerative colitis ([place]) 01/17/2020   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 02/09/2012    Khanh Cordner  01/09/2021, 12:30 PM  [facility] [address] [place], [place], [address] Phone: [phone]   Fax:  [phone]  Name: Matthew Francis MRN: [id number] Date of Birth: 10-27-1960

## 2021-01-11 ENCOUNTER — Other Ambulatory Visit: Payer: Self-pay

## 2021-01-11 ENCOUNTER — Encounter: Payer: 59 | Attending: Registered Nurse | Admitting: Registered Nurse

## 2021-01-11 ENCOUNTER — Encounter: Payer: Self-pay | Admitting: Registered Nurse

## 2021-01-11 ENCOUNTER — Ambulatory Visit: Payer: 59

## 2021-01-11 VITALS — BP 120/78 | HR 99 | Temp 98.7°F | Ht 71.0 in | Wt 214.2 lb

## 2021-01-11 DIAGNOSIS — F1910 Other psychoactive substance abuse, uncomplicated: Secondary | ICD-10-CM | POA: Insufficient documentation

## 2021-01-11 DIAGNOSIS — I10 Essential (primary) hypertension: Secondary | ICD-10-CM | POA: Insufficient documentation

## 2021-01-11 DIAGNOSIS — R41841 Cognitive communication deficit: Secondary | ICD-10-CM

## 2021-01-11 NOTE — Progress Notes (Signed)
Subjective:    Patient ID: Matthew Francis, male    DOB: [birth year], 60 y.o.   MRN: [id number]  HPI: Matthew Francis is a 60 y.o. male who  is here for Hospital Follow up of his Intracerebral hemorrhage, Essential Hypertension and Polysubstance Abuse. He arrived to [facility] on 12/11/2020, Per Dr Curly [place] Note:    HPI: Matthew Francis is a 60 y.o. male with a past medical history significant for ulcerative colitis, diabetes, hypertension, hyperlipidemia, chronic kidney disease, a single episode of prior paroxysmal atrial fibrillation, obesity (BMI 28).   He was in his usual state of health yesterday watching TV when around 10 or 11 PM he started to have a severe headache.  This prevented him from sleeping all night due to the severe throbbing pain.  It would improve slightly with Tylenol, he was taking 2 tabs of 500 mg at a time and estimates he took it 3 or 4 times although he did also begin to get confused and disoriented.  He was also having nausea and vomiting which she treated with a fruity over-the-counter home remedy without much improvement.  Given worsening of his headache today at around 1 or 2 PM with inability to stand he decided to present for further evaluation.  He notes he has been out of his blood pressure medications for a couple of days but otherwise has been taking most of his medications.  Urine Drug Screen was positive for cocaine.   CT Head WO Contrast:  IMPRESSION: 1. Acute hemorrhage within the right caudate/basal ganglia with hemorrhage in the right lateral, third, and fourth ventricles. No midline shift. The ventricles are slightly dilated as compared with prior CT from 2021. 2. Mild chronic small vessel ischemic change of the white matter.  MRA: MRI IMPRESSION: 1. Unchanged intraparenchymal hematoma of the right caudate head with intraventricular extension and mild communicating hydrocephalus. 2. Normal intracranial MRA.   Neurology and Neurosurgery was  consulted.   Mr. Platner was admitted to inpatient rehabilitation on 12/21/2020 and discharged home on 01/01/2021. He is receiving outpatient therapy at [facility]. He denies any pain at this time. He rated his pain 3 on his Health and History. Also reports he has a good appetite.     Pain Inventory Average Pain 5 Pain Right Now 3 My pain is intermittent, sharp, and aching  LOCATION OF PAIN  Abdomen, rectal pain, back pain  BOWEL Number of stools per week: 50 Oral laxative use No  Type of laxative none Enema or suppository use No  History of colostomy  2003 - no longer Incontinent Yes   BLADDER Normal In and out cath, frequency n/a Able to self cath No  Bladder incontinence No  Frequent urination Yes  Leakage with coughing No  Difficulty starting stream Yes  Incomplete bladder emptying Yes    Mobility walk without assistance how many minutes can you walk? No problem walking ability to climb steps?  yes do you drive?  yes Do you have any goals in this area?  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  Neuro/Psych bladder control problems bowel control problems numbness depression  Prior Studies Any changes since last visit?  no  Physicians involved in your care Any changes since last visit?  no   Family History  Problem Relation Age of Onset   Cancer Sister    Social History   Socioeconomic History   Marital status:  Single    Spouse name: Not on file   Number of children: Not on file   Years of education: Not on file   Highest education level: Not on file  Occupational History   Not on file  Tobacco Use   Smoking status: Never   Smokeless tobacco: Never  Vaping Use   Vaping Use: Never used  Substance and Sexual Activity   Alcohol use: Yes    Alcohol/week: 4.0 standard drinks    Types: 4 Cans of beer per week    Comment: 4 cans of beer per week   Drug use: No   Sexual activity:  Not Currently  Other Topics Concern   Not on file  Social History Narrative   Not on file   Social Determinants of Health   Financial Resource Strain: Not on file  Food Insecurity: Not on file  Transportation Needs: Not on file  Physical Activity: Not on file  Stress: Not on file  Social Connections: Not on file   Past Surgical History:  Procedure Laterality Date   APPENDECTOMY     COLECTOMY     Proctocolectomy with IPAA   COLECTOMY     FOREIGN BODY REMOVAL ABDOMINAL  02/17/2012   Procedure: [place];  Surgeon: Odis Hollingshead, MD;  Location: [facility];  Service: General;  Laterality: N/A;  abdominal wound exploration and removal of foreign body   HERNIA REPAIR     Multiple incisional hernias.   ileoanal anastomosis     Past Medical History:  Diagnosis Date   Arthritis    GERD (gastroesophageal reflux disease)    H/O ulcerative colitis    Hyperlipidemia    Hypertension    Inguinal hernia    Ulcer    ulcerative colitis   Ulcerative colitis    Wears glasses    BP 120/78   Pulse 99   Temp 98.7 F (37.1 C)   Ht 5' 11"  (1.803 m)   Wt 214 lb 3.2 oz (97.2 kg)   SpO2 96%   BMI 29.87 kg/m   Opioid Risk Score:   Fall Risk Score:  `1  Depression screen PHQ 2/9  Depression screen PHQ 2/9 01/11/2021  Decreased Interest 1  Down, Depressed, Hopeless 1  PHQ - 2 Score 2  Altered sleeping 1  Tired, decreased energy 1  Change in appetite 0  Feeling bad or failure about yourself  1  Trouble concentrating 0  Moving slowly or fidgety/restless 0  Suicidal thoughts 0  PHQ-9 Score 5     Review of Systems  Eyes:  Positive for visual disturbance.  Gastrointestinal:  Positive for abdominal pain, diarrhea, nausea and rectal pain.       Rectal pain  Genitourinary:  Positive for frequency.  Musculoskeletal:  Positive for back pain and neck pain.  Skin:  Positive for rash.  Neurological:  Positive for dizziness, weakness, numbness and  headaches.       Tingling in right hand  Psychiatric/Behavioral:  Positive for confusion.        Problem following directions  All other systems reviewed and are negative.     Objective:   Physical Exam Vitals and nursing note reviewed.  Constitutional:      Appearance: Normal appearance.  Cardiovascular:     Rate and Rhythm: Normal rate and regular rhythm.     Pulses: Normal pulses.     Heart sounds: Normal heart sounds.  Pulmonary:     Effort: Pulmonary effort  is normal.     Breath sounds: Normal breath sounds.  Musculoskeletal:     Cervical back: Normal range of motion and neck supple.     Comments: Normal Muscle Bulk and Muscle Testing Reveals:  Upper Extremities: Full ROM and Muscle Strength 5/5  Lower Extremities: Full ROM ands Muscle Strength 5/5 Arises from Table with ease Narrow Based Gait     Skin:    General: Skin is warm and dry.  Neurological:     Mental Status: He is alert and oriented to person, place, and time.  Psychiatric:        Mood and Affect: Mood normal.        Behavior: Behavior normal.          Assessment & Plan:  Intracerebral hemorrhage: He was instructed to call Dr Kathyrn Sheriff office to schedule HFU appointment. He has a scheduled appointment with Neurology. Continue Outpatient therapy. Continue to monitor.   Essential Hypertension: Continue current medication regimen. Continue to monitor.   Polysubstance Abuse.: Mr. Lappe reports he is not using any illicit drugs. Continue to monitor.  F/U with Dr Naaman Plummer in 4- 6 weeks

## 2021-01-11 NOTE — Therapy (Signed)
[facility] [address] [place], [place], [address] Phone: [phone]   Fax:  [phone]  Speech Language Pathology Treatment  Patient Details  Name: Matthew Francis MRN: [id number] Date of Birth: 27-Aug-1960 Referring Provider (SLP): Lauraine Rinne, [place]   Encounter Date: 01/11/2021   End of Session - 01/11/21 1614     Visit Number 3    Number of Visits 17    Date for SLP Re-Evaluation 03/08/21    SLP Start Time 25    SLP Stop Time  66    SLP Time Calculation (min) 29 min    Activity Tolerance Patient tolerated treatment well             Past Medical History:  Diagnosis Date   Arthritis    GERD (gastroesophageal reflux disease)    H/O ulcerative colitis    Hyperlipidemia    Hypertension    Inguinal hernia    Ulcer    ulcerative colitis   Ulcerative colitis    Wears glasses     Past Surgical History:  Procedure Laterality Date   APPENDECTOMY     COLECTOMY     Proctocolectomy with IPAA   COLECTOMY     FOREIGN BODY REMOVAL ABDOMINAL  02/17/2012   Procedure: REMOVAL FOREIGN BODY ABDOMINAL;  Surgeon: Odis Hollingshead, MD;  Location: [facility];  Service: General;  Laterality: N/A;  abdominal wound exploration and removal of foreign body   HERNIA REPAIR     Multiple incisional hernias.   ileoanal anastomosis      There were no vitals filed for this visit.   Subjective Assessment - 01/11/21 1225     Subjective Pt brought his meds and med box. Was 15 minutes late due to appt at pain and rehabilitation.    Currently in Pain? No/denies                   ADULT SLP TREATMENT - 01/11/21 1243       General Information   Behavior/Cognition Alert;Cooperative;Pleasant mood      Treatment Provided   Treatment provided Cognitive-Linquistic      Cognitive-Linquistic Treatment   Treatment focused on Cognition    Skilled Treatment "My short term memories not what it has been  before the stroke." SLP reiterated that is why we are working on medication administration today. Pt went though his meds and organized which OTCs he needed to go to buy today, and which meds were not every day. Pt put these in the correct pile. He did each day by day instead of filled med box with one/two each for the entire week, med by med. Pt left his camo bag with a pair of his prescription glasses in it and told SLP "S" statement while he and SLP walked to see if someone turned in pt's bag.      Assessment / Recommendations / Plan   Plan Continue with current plan of care      Progression Toward Goals   Progression toward goals Progressing toward goals                SLP Short Term Goals - 01/11/21 1618       SLP SHORT TERM GOAL #1   Title complete formal cognitive testing    Time 1    Period --   sessions   Status Partially Met   and cont to next week   Target Date 01/18/21  SLP SHORT TERM GOAL #2   Title pt will develop a memory compensation system of choice and use for appointment tracking, and/or medication administration in 3 sessions    Time 4    Period Weeks    Status On-going    Target Date 02/01/21              SLP Long Term Goals - 01/11/21 1618       SLP LONG TERM GOAL #1   Title Pt will use memory compensations for tracking medication, appointment tracking/scheduling, to-do lists, etc between 7 sessions    Time 8    Period Weeks    Status On-going    Target Date 03/08/21      SLP LONG TERM GOAL #2   Title pt will demonstrate divided attentoin between a simple linguistic task and another task for 10 minutes, in 3 sessions    Time 8    Period Weeks    Status On-going    Target Date 03/08/21              Plan - 01/11/21 1617     Clinical Impression Statement Tayt cont to present today with some deficits in short term memory - with instructions for trail making task. SLP did not finish Cognitive Linguistic Quick Test due to time  constraints and pt bringing his medication box-  full cognitive eval to be completed next week. Pt would cont to benefit from skilled ST targeting cognitive linguistic deficits.    Speech Therapy Frequency 2x / week    Duration 8 weeks    Treatment/Interventions Functional tasks;Compensatory techniques;Cueing hierarchy;SLP instruction and feedback;Patient/family education;Internal/external aids    Potential to Achieve Goals Good    Consulted and Agree with Plan of Care Patient             Patient will benefit from skilled therapeutic intervention in order to improve the following deficits and impairments:   Cognitive communication deficit    Problem List Patient Active Problem List   Diagnosis Date Noted   Hypertensive emergency 17/40/8144   Acute metabolic encephalopathy 81/85/6314   ICH (intracerebral hemorrhage) ([place]) 12/11/2020   UTI due to extended-spectrum beta lactamase (ESBL) producing Escherichia coli 01/19/2020   E coli bacteremia 01/18/2020   Severe sepsis ([place]) 01/17/2020   AKI (acute kidney injury) ([place]) 01/17/2020   Thrombocytopenia ([place]) 01/17/2020   Essential hypertension 01/17/2020   Ulcerative colitis ([place]) 01/17/2020   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 02/09/2012    Phylisha Dix ,MS, CCC-SLP   01/11/2021, 4:19 PM  [facility] [address] [place], [place], [address] Phone: [phone]   Fax:  [phone]   Name: Matthew Francis MRN: [id number] Date of Birth: [birth year]

## 2021-01-11 NOTE — Patient Instructions (Signed)
Call Dr Kathyrn Sheriff office to schedule hospital follow up   [phone]

## 2021-01-15 ENCOUNTER — Ambulatory Visit: Payer: 59

## 2021-01-15 ENCOUNTER — Encounter: Payer: Self-pay | Admitting: *Deleted

## 2021-01-15 ENCOUNTER — Other Ambulatory Visit: Payer: Self-pay

## 2021-01-15 ENCOUNTER — Ambulatory Visit: Payer: 59 | Attending: Physician Assistant | Admitting: Occupational Therapy

## 2021-01-15 DIAGNOSIS — R41840 Attention and concentration deficit: Secondary | ICD-10-CM | POA: Insufficient documentation

## 2021-01-15 DIAGNOSIS — R41844 Frontal lobe and executive function deficit: Secondary | ICD-10-CM | POA: Diagnosis present

## 2021-01-15 DIAGNOSIS — R41842 Visuospatial deficit: Secondary | ICD-10-CM | POA: Diagnosis present

## 2021-01-15 DIAGNOSIS — R278 Other lack of coordination: Secondary | ICD-10-CM

## 2021-01-15 DIAGNOSIS — R41841 Cognitive communication deficit: Secondary | ICD-10-CM | POA: Insufficient documentation

## 2021-01-15 DIAGNOSIS — R208 Other disturbances of skin sensation: Secondary | ICD-10-CM | POA: Diagnosis not present

## 2021-01-15 DIAGNOSIS — M6281 Muscle weakness (generalized): Secondary | ICD-10-CM | POA: Diagnosis present

## 2021-01-15 DIAGNOSIS — R2689 Other abnormalities of gait and mobility: Secondary | ICD-10-CM | POA: Insufficient documentation

## 2021-01-15 NOTE — Therapy (Signed)
[facility] [address] [place], [place], [address] Phone: [phone]   Fax:  [phone]  Speech Language Pathology Treatment  Patient Details  Name: Matthew Francis MRN: [id number] Date of Birth: 1961-03-06 Referring Provider (SLP): Lauraine Rinne, [place]   Encounter Date: 01/15/2021   End of Session - 01/15/21 1118     Visit Number 4    Number of Visits 17    Date for SLP Re-Evaluation 03/08/21    SLP Start Time 32    SLP Stop Time  35    SLP Time Calculation (min) 41 min    Activity Tolerance Patient tolerated treatment well             Past Medical History:  Diagnosis Date   Arthritis    GERD (gastroesophageal reflux disease)    H/O ulcerative colitis    Hyperlipidemia    Hypertension    Inguinal hernia    Ulcer    ulcerative colitis   Ulcerative colitis    Wears glasses     Past Surgical History:  Procedure Laterality Date   APPENDECTOMY     COLECTOMY     Proctocolectomy with IPAA   COLECTOMY     FOREIGN BODY REMOVAL ABDOMINAL  02/17/2012   Procedure: REMOVAL FOREIGN BODY ABDOMINAL;  Surgeon: Odis Hollingshead, MD;  Location: [facility];  Service: General;  Laterality: N/A;  abdominal wound exploration and removal of foreign body   HERNIA REPAIR     Multiple incisional hernias.   ileoanal anastomosis      There were no vitals filed for this visit.   Subjective Assessment - 01/15/21 0939     Subjective Pt went to [facility] and they only had one OTC med. Pt went home and then after lunch went to the other [place] and got all five.    Currently in Pain? No/denies                   ADULT SLP TREATMENT - 01/15/21 0940       General Information   Behavior/Cognition Alert;Cooperative;Pleasant mood      Treatment Provided   Treatment provided Cognitive-Linquistic      Cognitive-Linquistic Treatment   Treatment focused on Cognition    Skilled Treatment "I  am starting to feel more like myself now." Pt will fill up his meds for a week and bring it to next session to show SLP. Pt changed his bank password and his laptop code working - did this via YouTube. While he was in the hospital pt did not pay his cable bill so it was stopped. After he reinitiated his service it still didn't work so he had to restart his modem x3, but this started the signal to his TV. SLP completed the Cognitive Linguistic Quick Test wiht pt today. See "clinicial impression" for details.      Assessment / Recommendations / Plan   Plan Goals updated   LTGs updated including attention to detail     Progression Toward Goals   Progression toward goals Progressing toward goals              SLP Education - 01/15/21 1117     Education Details attention to details is still challenging for pt - pt affirmed this is the case    Person(s) Educated Patient    Methods Explanation    Comprehension Verbalized understanding  SLP Short Term Goals - 01/15/21 1122       SLP SHORT TERM GOAL #1   Title complete formal cognitive testing    Time 1    Period --   sessions   Status Achieved   and cont to next week   Target Date 01/18/21      SLP SHORT TERM GOAL #2   Title pt will develop a memory compensation system of choice and use for appointment tracking, and/or medication administration in 3 sessions    Baseline 01-15-21    Time 4    Period Weeks    Status On-going    Target Date 02/01/21              SLP Long Term Goals - 01/15/21 1123       SLP LONG TERM GOAL #1   Title Pt will use memory compensations for tracking medication, appointment tracking/scheduling, to-do lists, etc between 7 sessions    Time 8    Period Weeks    Status On-going      SLP LONG TERM GOAL #2   Title pt will demonstrate divided attentoin between a simple linguistic task and another task for 10 minutes, in 3 sessions    Time 8    Period Weeks    Status On-going       SLP LONG TERM GOAL #3   Title pt will demo attention to details to self correct for 100% success in fucntional cognitie linguistic tasks in 3 sessions    Time 8    Period Weeks    Status New    Target Date 03/08/21              Plan - 01/15/21 1118     Clinical Impression Statement Marcelo cont to present today with some improving short term memory - and with attention to details (highlighted by performance with CLQT). Pt scored WNL on CLQT with following scores Attention- 189, Memory- 171, Executive Function- 30, Language 33, Visuospatial Skills- 94, clock drawing 13. Pt endorses he has difficulty with attention to detail. This was seen last session as pt put two of one med into a med box well that he should have only put one. He caught his error when he double-checked his work.  Pt would cont to benefit from skilled ST targeting cognitive linguistic deficits.    Speech Therapy Frequency 2x / week    Duration 8 weeks    Treatment/Interventions Functional tasks;Compensatory techniques;Cueing hierarchy;SLP instruction and feedback;Patient/family education;Internal/external aids    Potential to Achieve Goals Good    Consulted and Agree with Plan of Care Patient             Patient will benefit from skilled therapeutic intervention in order to improve the following deficits and impairments:   Cognitive communication deficit    Problem List Patient Active Problem List   Diagnosis Date Noted   Hypertensive emergency 85/63/1497   Acute metabolic encephalopathy 02/63/7858   ICH (intracerebral hemorrhage) ([place]) 12/11/2020   UTI due to extended-spectrum beta lactamase (ESBL) producing Escherichia coli 01/19/2020   E coli bacteremia 01/18/2020   Severe sepsis ([place]) 01/17/2020   AKI (acute kidney injury) ([place]) 01/17/2020   Thrombocytopenia ([place]) 01/17/2020   Essential hypertension 01/17/2020   Ulcerative colitis ([place]) 01/17/2020   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  02/09/2012    Seiji Wiswell ,MS, CCC-SLP  01/15/2021, 11:25 AM  [facility] [address] [place],  [place], [address] Phone: [phone]   Fax:  [phone]   Name: SHEENA SIMONIS MRN: [id number] Date of Birth: [birth year]

## 2021-01-15 NOTE — Patient Instructions (Addendum)
Fine Motor Coordination Exercises  Perform the following exercises 1-2 times a day, for 10 minutes at a time, as recommended by your occupational therapist.  Touch thumb to each fingertip. Increase speed as able. (10 times) Pick up 5 small objects (coins, marbles, paperclips, beads, etc.) one at a time and hold them in hand, then place them one by one onto the table. Pick up small objects and place them into a cup or container. Thread buttons or beads onto a string. Place clothespins onto the edge of a cup, can, or container. Play card games. Practice shuffling and dealing cards. Flip cards over onto table one by one. Practice screwing and unscrewing nuts/bolts. Stack approximately [employer] (checkers, coins, etc.) onto table. Take two golf balls and rotate in one hand at a time. Change directions.  Take a tennis ball ball and toss in your hand. Then toss from 1 hand to the other.  Use tweezers to pick up small objects and place in a container

## 2021-01-15 NOTE — Therapy (Signed)
[facility] [address] [place], [place], [address] Phone: [phone]   Fax:  [phone]  Occupational Therapy Treatment  Patient Details  Name: Matthew Francis MRN: [id number] Date of Birth: 12/23/1960 Referring Provider (OT): Dr. Naaman Plummer   Encounter Date: 01/15/2021   OT End of Session - 01/15/21 1242     Visit Number 2    Number of Visits 20    Date for OT Re-Evaluation 03/20/21    Authorization Type [employer]    Authorization Time Period 10 weeks to account for scheduling    Authorization - Visit Number 2    Authorization - Number of Visits 20    OT Start Time 1146    OT Stop Time 1226    OT Time Calculation (min) 40 min    Activity Tolerance Patient tolerated treatment well    Behavior During Therapy WFL for tasks assessed/performed             Past Medical History:  Diagnosis Date   Arthritis    GERD (gastroesophageal reflux disease)    H/O ulcerative colitis    Hyperlipidemia    Hypertension    Inguinal hernia    Ulcer    ulcerative colitis   Ulcerative colitis    Wears glasses     Past Surgical History:  Procedure Laterality Date   APPENDECTOMY     COLECTOMY     Proctocolectomy with IPAA   COLECTOMY     FOREIGN BODY REMOVAL ABDOMINAL  02/17/2012   Procedure: REMOVAL FOREIGN BODY ABDOMINAL;  Surgeon: Odis Hollingshead, MD;  Location: [facility];  Service: General;  Laterality: N/A;  abdominal wound exploration and removal of foreign body   HERNIA REPAIR     Multiple incisional hernias.   ileoanal anastomosis      There were no vitals filed for this visit.   Subjective Assessment - 01/15/21 1151     Subjective  Denies pain. Also reports tha tnumbness is improving.    Patient Stated Goals improve short term memory, improve hand numbness    Currently in Pain? No/denies    Pain Score 0-No pain    Multiple Pain Sites No                OT Treatments/Exercises  (OP) - 01/15/21 0001       ADLs   ADL Comments Pt denies difficulty with dressing, buttoning buttons, snaps, zippers. He was independent buttoning buttons on izod type shirt today w/o difficulty noted. Verbal review of checking temperature of water etc as pt has some sensory deficits in R hand. Pt repotrs that sensation is improving overall. Currently in RF/SM R "It's not tingling in the middle finger anymore"      Exercises   Exercises Hand      Hand Exercises   Other Hand Exercises Pt was issued and instructed in HEP for FMC/Coordination. Verbal, written instruction provided and reviewed as well as pt performance in clinic.      Fine Motor Coordination (Hand/Wrist)   Fine Motor Coordination In hand manipuation training;Manipulation of small objects;Flipping cards;Dealing card with thumb;Picking up coins;Manipulating coins;Stacking coins;Tossing ball    In Hand Manipulation Training R UE rotating golf balls, changing direction - Mod difficulty noted.    Manipulation of small objects picking up 5 coins at a time and dropping only 1 at a time into container. Verbal cues to avoid compensation at shoulder/shoulder hike.    Flipping cards flipping cards  with both right and left hands. Min difficulty noted w/ R.    Dealing card with thumb Dealing 1 card at a time with R thumb - Moderate difficulty noted and increased time. Difficulty isolating & controlling cards noted. Min vc's secondary to compensatory patterns at right shoulder noted.    Picking up coins Picking up 5 coins at a time. Min vc's to not slide coins to table edge.    Manipulating coins Holding multiple coins in right hand and releasing only 1 at a time into container. Min difficulty noted.    Stacking coins Pt stacked coins in piles of 5 on table top using R UE. Min vc's to lower right shoulder and avoid shoulder hike during activity.    Tossing ball Tossing bean bag ball in right hand. Progressing to tossing between right and left  hands with moderate difficulty noted. Pt appears to rush through tasks ans benefits from vc's to slow down and pay attention to technique for better FMC/control.             Fine Motor Coordination Exercises  Perform the following exercises 1-2 times a day, for 10 minutes at a time, as recommended by your occupational therapist.  Touch thumb to each fingertip. Increase speed as able. (10 times) Pick up 5 small objects (coins, marbles, paperclips, beads, etc.) one at a time and hold them in hand, then place them one by one onto the table. Pick up small objects and place them into a cup or container. Thread buttons or beads onto a string. Place clothespins onto the edge of a cup, can, or container. Play card games. Practice shuffling and dealing cards. Flip cards over onto table one by one. Practice screwing and unscrewing nuts/bolts. Stack approximately [employer] (checkers, coins, etc.) onto table. Take two golf balls and rotate in one hand at a time. Change directions.  Take a tennis ball ball and toss in your hand. Then toss from 1 hand to the other.  Use tweezers to pick up small objects and place in a container       OT Education - 01/15/21 1241     Education Details HEP FMC/coordination and control, avoidance of compensation at right shoulder during functional activitiy.    Person(s) Educated Patient    Methods Explanation;Demonstration;Verbal cues;Handout    Comprehension Verbalized understanding;Returned demonstration;Need further instruction              OT Short Term Goals - 01/09/21 1042       OT SHORT TERM GOAL #1   Title I with inital HEP    Time 4    Period Weeks    Status New    Target Date 02/06/21      OT SHORT TERM GOAL #2   Title Pt will verbalize understanding of precautions related to sensory deficits for RUE    Time 4    Period Weeks    Status New      OT SHORT TERM GOAL #3   Title Pt will demonstrate ability to cook 2 items  simultaneously modified indpendently demonstrating good safety awareness.    Time 4    Period Weeks    Status New      OT SHORT TERM GOAL #4   Title Pt will perfrom mod complex envirronmental scanning task with 90% or better accuracy in prep for work and driving    Time 4    Period Weeks    Status New  OT Long Term Goals - 01/09/21 1045       OT LONG TERM GOAL #1   Title Pt will perfrom simulated work activities modified independently in a reasonable amount of time.    Time 10    Period Weeks    Status New    Target Date 03/20/21      OT LONG TERM GOAL #2   Title Pt will demonstrate improved fine motor coordination for work activities as evidenced by decreasing  RUE 9 hole peg test score by 3 secs and perform without drops    Time 10    Period Weeks    Status New      OT LONG TERM GOAL #3   Title Pt will perfrom a physical and cognitve task simultaneously  with 90% or better accuracy in prep for driving    Time 10    Period Weeks    Status New                   Plan - 01/15/21 1243     Clinical Impression Statement Pt was seen for instruction in HEP R UE coordination and [facility] today. He benefits from vc's for shoulder position during functional activities and tasks as a means of compensating for deficits. Pt is anxious to return to work and he was advised to f/u with Dr Naaman Plummer for clearance to do this. He appears to have decreaed awareness of deficits. Cont to assess in functional context.    OT Occupational Profile and History Problem Focused Assessment - Including review of records relating to presenting problem    Occupational performance deficits (Please refer to evaluation for details): ADL's;IADL's;Work;Leisure;Social Participation    Body Structure / Function / Physical Skills ADL;UE functional use;Balance;Flexibility;Vision;FMC;ROM;Gait;GMC;Coordination;Decreased knowledge of precautions;Sensation;IADL;Decreased knowledge of use of  DME;Dexterity;Strength;Mobility    Cognitive Skills Attention;Problem Solve;Thought    Rehab Potential Good    Clinical Decision Making Limited treatment options, no task modification necessary    Comorbidities Affecting Occupational Performance: May have comorbidities impacting occupational performance    Modification or Assistance to Complete Evaluation  No modification of tasks or assist necessary to complete eval    OT Frequency 2x / week    OT Duration Other (comment)   x10 weeks   OT Treatment/Interventions Self-care/ADL training;Ultrasound;Energy conservation;Visual/perceptual remediation/compensation;Patient/family education;DME and/or AE instruction;Aquatic Therapy;Paraffin;Passive range of motion;Balance training;Fluidtherapy;Cryotherapy;Electrical Stimulation;Splinting;Functional Mobility Training;Moist Heat;Therapeutic exercise;Manual Therapy;Therapeutic activities;Neuromuscular education;Cognitive remediation/compensation    Plan Review/check HEP for coordination. Visual, scanning activities and pt education.    Consulted and Agree with Plan of Care Patient             Patient will benefit from skilled therapeutic intervention in order to improve the following deficits and impairments:   Body Structure / Function / Physical Skills: ADL, UE functional use, Balance, Flexibility, Vision, FMC, ROM, Gait, GMC, Coordination, Decreased knowledge of precautions, Sensation, IADL, Decreased knowledge of use of DME, Dexterity, Strength, Mobility Cognitive Skills: Attention, Problem Solve, Thought     Visit Diagnosis: Other disturbances of skin sensation  Other lack of coordination  Muscle weakness (generalized)  Attention and concentration deficit  Frontal lobe and executive function deficit  Visuospatial deficit    Problem List Patient Active Problem List   Diagnosis Date Noted   Hypertensive emergency 16/03/9603   Acute metabolic encephalopathy 54/02/8118   ICH  (intracerebral hemorrhage) ([place]) 12/11/2020   UTI due to extended-spectrum beta lactamase (ESBL) producing Escherichia coli 01/19/2020   E coli bacteremia 01/18/2020   Severe  sepsis ([place]) 01/17/2020   AKI (acute kidney injury) ([place]) 01/17/2020   Thrombocytopenia ([place]) 01/17/2020   Essential hypertension 01/17/2020   Ulcerative colitis ([place]) 01/17/2020   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 02/09/2012    Yobany Vroom Beth Dixon, OTR/L 01/15/2021, 12:53 PM  [facility] [address] [place], [place], [address] Phone: [phone]   Fax:  [phone]  Name: ZYAD BOOMER MRN: [id number] Date of Birth: [birth year]

## 2021-01-18 ENCOUNTER — Other Ambulatory Visit: Payer: Self-pay

## 2021-01-18 ENCOUNTER — Ambulatory Visit: Payer: 59 | Admitting: Occupational Therapy

## 2021-01-18 ENCOUNTER — Telehealth: Payer: Self-pay

## 2021-01-18 ENCOUNTER — Ambulatory Visit: Payer: 59

## 2021-01-18 ENCOUNTER — Ambulatory Visit: Payer: 59 | Admitting: Physical Therapy

## 2021-01-18 DIAGNOSIS — M6281 Muscle weakness (generalized): Secondary | ICD-10-CM

## 2021-01-18 DIAGNOSIS — R278 Other lack of coordination: Secondary | ICD-10-CM

## 2021-01-18 DIAGNOSIS — R41844 Frontal lobe and executive function deficit: Secondary | ICD-10-CM

## 2021-01-18 DIAGNOSIS — R2689 Other abnormalities of gait and mobility: Secondary | ICD-10-CM

## 2021-01-18 DIAGNOSIS — R208 Other disturbances of skin sensation: Secondary | ICD-10-CM

## 2021-01-18 DIAGNOSIS — R41840 Attention and concentration deficit: Secondary | ICD-10-CM

## 2021-01-18 DIAGNOSIS — R41842 Visuospatial deficit: Secondary | ICD-10-CM

## 2021-01-18 DIAGNOSIS — R41841 Cognitive communication deficit: Secondary | ICD-10-CM

## 2021-01-18 NOTE — Therapy (Signed)
[facility] [address] [place], [place], [address] Phone: [phone]   Fax:  [phone]  Speech Language Pathology Treatment  Patient Details  Name: Matthew Francis MRN: [id number] Date of Birth: 07-16-1960 Referring Provider (SLP): Lauraine Rinne, [place]   Encounter Date: 01/18/2021   End of Session - 01/18/21 1102     Visit Number 5    Number of Visits 17    Date for SLP Re-Evaluation 03/08/21    SLP Start Time 0939    SLP Stop Time  77    SLP Time Calculation (min) 49 min    Activity Tolerance Patient tolerated treatment well             Past Medical History:  Diagnosis Date   Arthritis    GERD (gastroesophageal reflux disease)    H/O ulcerative colitis    Hyperlipidemia    Hypertension    Inguinal hernia    Ulcer    ulcerative colitis   Ulcerative colitis    Wears glasses     Past Surgical History:  Procedure Laterality Date   APPENDECTOMY     COLECTOMY     Proctocolectomy with IPAA   COLECTOMY     FOREIGN BODY REMOVAL ABDOMINAL  02/17/2012   Procedure: REMOVAL FOREIGN BODY ABDOMINAL;  Surgeon: Odis Hollingshead, MD;  Location: [facility];  Service: General;  Laterality: N/A;  abdominal wound exploration and removal of foreign body   HERNIA REPAIR     Multiple incisional hernias.   ileoanal anastomosis      There were no vitals filed for this visit.   Subjective Assessment - 01/18/21 1038     Subjective "I'm doing my homework. Honestly I didn't get it done. It's been crazy - bills coming due and I'm not working."    Currently in Pain? No/denies                   ADULT SLP TREATMENT - 01/18/21 1037       General Information   Behavior/Cognition Alert;Cooperative;Pleasant mood      Treatment Provided   Treatment provided Cognitive-Linquistic      Cognitive-Linquistic Treatment   Treatment focused on Cognition    Skilled Treatment Pt tells SLP he needs to  drive in order to work. Divided attention task today - written and auditory. Of note: pt wrote "example" items into the task but they were clearly marked "EXAMPLE:". Pt, other than that, wsa 199% correct on written task and got 63% of auditory stimulus. Pt was able to tell SLP the artist for some songs he heard on the radio as he was writing for the written task. (simple divided attention). His attention to detail homework in written directions task was 95% successful. Pt brought his med box, filled up to Tuesday next week. Pt said he stopped when he ran out of meds and he will need to get refills.      Assessment / Recommendations / Plan   Plan Continue with current plan of care      Progression Toward Goals   Progression toward goals Progressing toward goals              SLP Education - 01/18/21 1101     Education Details divided attention may well be deficit area - we will cont to work on this    [employer]) Educated Patient    Methods Explanation    Comprehension Verbalized understanding  SLP Short Term Goals - 01/18/21 1103       SLP SHORT TERM GOAL #1   Title complete formal cognitive testing    Time 1    Period --   sessions   Status Achieved   and cont to next week   Target Date 01/18/21      SLP SHORT TERM GOAL #2   Title pt will develop a memory compensation system of choice and use for appointment tracking, and/or medication administration in 3 sessions    Baseline 01-15-21    Time 4    Period Weeks    Status On-going    Target Date 02/01/21              SLP Long Term Goals - 01/18/21 1105       SLP LONG TERM GOAL #1   Title Pt will use memory compensations for tracking medication, appointment tracking/scheduling, to-do lists, etc between 7 sessions    Baseline 01-15-21, 01-18-21    Time 8    Period Weeks    Status On-going    Target Date 03/08/21      SLP LONG TERM GOAL #2   Title pt will demonstrate divided attentoin between a simple  linguistic task and another task for 10 minutes, in 3 sessions    Time 8    Period Weeks    Status On-going    Target Date 03/08/21      SLP LONG TERM GOAL #3   Title pt will demo attention to details to self correct for 100% success in fucntional cognitie linguistic tasks in 3 sessions    Time 8    Period Weeks    Status On-going              Plan - 01/18/21 1102     Clinical Impression Statement Matthew Francis cont to present today with some improving short term memory - and with attention to details (highlighted by performance with CLQT). Pt scored WNL on CLQT with following scores Attention- 189, Memory- 171, Executive Function- 30, Language 33, Visuospatial Skills- 94, clock drawing 13. Pt endorses he has difficulty with attention to detail. This was seen last session as pt put two of one med into a med box well that he should have only put one. He caught his error when he double-checked his work. Divided attention seen today as more difficult.  Pt would cont to benefit from skilled ST targeting cognitive linguistic deficits.    Speech Therapy Frequency 2x / week    Duration 8 weeks    Treatment/Interventions Functional tasks;Compensatory techniques;Cueing hierarchy;SLP instruction and feedback;Patient/family education;Internal/external aids    Potential to Achieve Goals Good    Consulted and Agree with Plan of Care Patient             Patient will benefit from skilled therapeutic intervention in order to improve the following deficits and impairments:   Cognitive communication deficit    Problem List Patient Active Problem List   Diagnosis Date Noted   Hypertensive emergency 01/65/5374   Acute metabolic encephalopathy 82/70/7867   ICH (intracerebral hemorrhage) ([place]) 12/11/2020   UTI due to extended-spectrum beta lactamase (ESBL) producing Escherichia coli 01/19/2020   E coli bacteremia 01/18/2020   Severe sepsis ([place]) 01/17/2020   AKI (acute kidney injury) ([place])  01/17/2020   Thrombocytopenia ([place]) 01/17/2020   Essential hypertension 01/17/2020   Ulcerative colitis ([place]) 01/17/2020   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 02/09/2012    Matthew Francis ,  MS, CCC-SLP  01/18/2021, 11:07 AM  [facility] [address] [place], [place], [address] Phone: [phone]   Fax:  [phone]   Name: Matthew Francis MRN: [id number] Date of Birth: 17-Apr-1961

## 2021-01-18 NOTE — Therapy (Signed)
[facility] [address] [place], [place], [address] Phone: [phone]   Fax:  [phone]  Physical Therapy Treatment  Patient Details  Name: Matthew Francis MRN: [id number] Date of Birth: 10-30-1960 Referring Provider (PT): Cathlyn Parsons, PA-C   Encounter Date: 01/18/2021   PT End of Session - 01/18/21 1103     Visit Number 2    Number of Visits 7    Date for PT Re-Evaluation 02/14/21    Authorization Type [employer]    PT Start Time 1105    PT Stop Time 1145    PT Time Calculation (min) 40 min    Equipment Utilized During Treatment Gait belt    Activity Tolerance Patient tolerated treatment well    Behavior During Therapy WFL for tasks assessed/performed             Past Medical History:  Diagnosis Date   Arthritis    GERD (gastroesophageal reflux disease)    H/O ulcerative colitis    Hyperlipidemia    Hypertension    Inguinal hernia    Ulcer    ulcerative colitis   Ulcerative colitis    Wears glasses     Past Surgical History:  Procedure Laterality Date   APPENDECTOMY     COLECTOMY     Proctocolectomy with IPAA   COLECTOMY     FOREIGN BODY REMOVAL ABDOMINAL  02/17/2012   Procedure: REMOVAL FOREIGN BODY ABDOMINAL;  Surgeon: Odis Hollingshead, MD;  Location: [facility];  Service: General;  Laterality: N/A;  abdominal wound exploration and removal of foreign body   HERNIA REPAIR     Multiple incisional hernias.   ileoanal anastomosis      There were no vitals filed for this visit.   Subjective Assessment - 01/18/21 1105     Subjective Pt is waiting for Dr. Naaman Plummer to clear him for driving so he can return to work. Pt states he was told 1-3 weeks until driving. Pt states he feels ready to drive as his vision has improved.    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 no issues    How long can you stand comfortably? no issues    How long can you walk comfortably? ~15 min before L LE gets too tired    Patient Stated Goals Return to work and normal function    Currently in Pain? No/denies                               [facility] Adult PT Treatment/Exercise - 01/18/21 0001       Exercises   Exercises Knee/Hip      Knee/Hip Exercises: Seated   Sit to Sand with UE support   2x10 single leg sit<>stand on L                Balance Exercises - 01/18/21 0001       Balance Exercises: Standing   Stepping Strategy Anterior;Posterior;Lateral;Foam/compliant surface;UE support;10 reps   posterior/medial stepping included as well   Rockerboard Anterior/posterior;Lateral   forward/backward ankle x10; forward step on/off rockerboard x10; keeping centered 2x30 sec with EC A/P and lateral;                   PT Long Term Goals - 01/03/21 1301  PT LONG TERM GOAL #1   Title Pt will be independent with HEP and transition to community wellness    Time 6    Period Weeks    Status New    Target Date 02/14/21      PT LONG TERM GOAL #2   Title Pt will be able to demo L = R LE strength when using leg press    Baseline see Strength    Time 6    Period Weeks    Status New    Target Date 02/14/21      PT LONG TERM GOAL #3   Title Pt will have 28/28 on mini BESTest    Baseline 24/28    Time 6    Period Weeks    Status New    Target Date 02/14/21                   Plan - 01/18/21 1125     Clinical Impression Statement Treatment focused on establishing initial HEP for L LE strength, endurance, and balance. Worked on stepping strategies this session on balance zone foam. Pt required rest breaks in between.    Personal Factors and Comorbidities Age;Fitness;Time since onset of injury/illness/exacerbation    Examination-Activity Limitations Locomotion  Level;Caring for Others;Carry    Examination-Participation Restrictions Cleaning;Occupation;Yard Work;Driving    Stability/Clinical Decision Making Stable/Uncomplicated    Rehab Potential Excellent    PT Frequency 1x / week    PT Duration 6 weeks    PT Treatment/Interventions ADLs/Self Care Home Management;Aquatic Therapy;Cryotherapy;Moist Heat;Gait training;Stair training;Functional mobility training;Therapeutic activities;Therapeutic exercise;Balance training;Neuromuscular re-education;Manual techniques;Patient/family education;Orthotic Fit/Training;Dry needling;Taping    PT Next Visit Plan Work on high level balance -- single leg stance, foam, compensatory balance strategies. Strengthening -- consider elliptical, single leg sit<>stands/wall slides/squats, squats. Endurance with walking (try unlevel surfaces outside).    PT Home Exercise Plan Access Code: [email or web address]    Consulted and Agree with Plan of Care Patient             Patient will benefit from skilled therapeutic intervention in order to improve the following deficits and impairments:  Decreased endurance, Decreased activity tolerance, Decreased balance, Decreased strength, Decreased mobility  Visit Diagnosis: Other lack of coordination  Muscle weakness (generalized)  Other abnormalities of gait and mobility     Problem List Patient Active Problem List   Diagnosis Date Noted   Hypertensive emergency 56/21/3086   Acute metabolic encephalopathy 57/84/6962   ICH (intracerebral hemorrhage) ([facility]) 12/11/2020   UTI due to extended-spectrum beta lactamase (ESBL) producing Escherichia coli 01/19/2020   E coli bacteremia 01/18/2020   Severe sepsis ([facility]) 01/17/2020   AKI (acute kidney injury) ([facility]) 01/17/2020   Thrombocytopenia ([facility]) 01/17/2020   Essential hypertension 01/17/2020   Ulcerative colitis ([facility]) 01/17/2020   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 02/09/2012    Terrah Decoster April Ma L Diangelo Radel PT,  DPT 01/18/2021, 1:15 PM  [facility] [address] [place], [place], [address] Phone: [phone]   Fax:  [phone]  Name: Matthew Francis MRN: [id number] Date of Birth: 1960/07/23

## 2021-01-18 NOTE — Patient Instructions (Signed)
Access Code: [email or web address] URL: [email or web address] Date: 01/18/2021 Prepared by: Estill Bamberg April Thurnell Garbe  Exercises Single Leg Sit to Stand with Arms Extended - 1 x daily - 7 x weekly - 2 sets - 10 reps Upright Side Lunge - 1 x daily - 7 x weekly - 1 sets - 10 reps Curtsy Squat - 1 x daily - 7 x weekly - 1 sets - 10 reps Alternating Step Backward with Support - 1 x daily - 7 x weekly - 3 sets - 10 reps

## 2021-01-18 NOTE — Patient Instructions (Signed)
  Please complete the assigned speech therapy homework prior to your next session and return it to the speech therapist at your next visit.

## 2021-01-18 NOTE — Therapy (Signed)
[facility] [address] [place], [place], [address] Phone: [phone]   Fax:  [phone]  Occupational Therapy Treatment  Patient Details  Name: Matthew Francis MRN: [id number] Date of Birth: Nov 09, 1960 Referring Provider (OT): Dr. Naaman Plummer   Encounter Date: 01/18/2021   OT End of Session - 01/18/21 1223     Visit Number 3    Number of Visits 20    Date for OT Re-Evaluation 03/20/21    Authorization Type [employer]    Authorization Time Period 10 weeks to account for scheduling    Authorization - Visit Number 3    Authorization - Number of Visits 15    OT Start Time 9758    OT Stop Time 1230    OT Time Calculation (min) 42 min    Activity Tolerance Patient tolerated treatment well    Behavior During Therapy WFL for tasks assessed/performed             Past Medical History:  Diagnosis Date   Arthritis    GERD (gastroesophageal reflux disease)    H/O ulcerative colitis    Hyperlipidemia    Hypertension    Inguinal hernia    Ulcer    ulcerative colitis   Ulcerative colitis    Wears glasses     Past Surgical History:  Procedure Laterality Date   APPENDECTOMY     COLECTOMY     Proctocolectomy with IPAA   COLECTOMY     FOREIGN BODY REMOVAL ABDOMINAL  02/17/2012   Procedure: REMOVAL FOREIGN BODY ABDOMINAL;  Surgeon: Odis Hollingshead, MD;  Location: [facility];  Service: General;  Laterality: N/A;  abdominal wound exploration and removal of foreign body   HERNIA REPAIR     Multiple incisional hernias.   ileoanal anastomosis      There were no vitals filed for this visit.   Subjective Assessment - 01/18/21 1148     Patient Stated Goals improve short term memory, improve hand numbness    Currently in Pain? Yes    Pain Score 4     Pain Location Abdomen    Pain Descriptors / Indicators Discomfort    Pain Type Chronic pain    Pain Onset More than a month ago    Pain Frequency  Intermittent    Aggravating Factors  diet    Pain Relieving Factors changing diet                 Treatment: Fine motor coordination tasks to copy small peg design for coordination and visual component, removing with in hand manipulation. Pt demonstrates good overall performance.  O'Connor pegs with tweezers for sustained pinch, good performance, min v.c Environmental scanning to locate items in sequential order 91% accuracy, 1 items missed first pass. Arm bike x 4 mins for conditioning, pt reports it was making shoulder tight so activity was discontinued. Wall slides x 10 for shoulder stretch.                  OT Short Term Goals - 01/18/21 1405       OT SHORT TERM GOAL #1   Title I with inital HEP    Time 4    Period Weeks    Status On-going    Target Date 02/06/21      OT SHORT TERM GOAL #2   Title Pt will verbalize understanding of precautions related to sensory deficits for RUE    Time 4  Period Weeks    Status Achieved      OT SHORT TERM GOAL #3   Title Pt will demonstrate ability to cook 2 items simultaneously modified indpendently demonstrating good safety awareness.    Time 4    Period Weeks    Status On-going      OT SHORT TERM GOAL #4   Title Pt will perfrom mod complex envirronmental scanning task with 90% or better accuracy in prep for work and driving    Time 4    Period Weeks               OT Long Term Goals - 01/18/21 1150       OT LONG TERM GOAL #1   Title Pt will perfrom simulated work activities modified independently in a reasonable amount of time.    Time 10    Period Weeks    Status On-going      OT LONG TERM GOAL #2   Title Pt will demonstrate improved fine motor coordination for work activities as evidenced by decreasing  RUE 9 hole peg test score by 3 secs and perform without drops    Time 10    Period Weeks    Status On-going      OT LONG TERM GOAL #3   Title Pt will perfrom a physical and cognitve task  simultaneously  with 90% or better accuracy in prep for driving    Time 10    Period Weeks    Status On-going                   Plan - 01/18/21 1227     Clinical Impression Statement Pt is progressing towards goals. He demonstrates improving fine motor coordination in prep for return to work.Pt preformed environmental scanning with a cognitive component with only one error on first pass.    OT Occupational Profile and History Problem Focused Assessment - Including review of records relating to presenting problem    Occupational performance deficits (Please refer to evaluation for details): ADL's;IADL's;Work;Leisure;Social Participation    Body Structure / Function / Physical Skills ADL;UE functional use;Balance;Flexibility;Vision;FMC;ROM;Gait;GMC;Coordination;Decreased knowledge of precautions;Sensation;IADL;Decreased knowledge of use of DME;Dexterity;Strength;Mobility    Cognitive Skills Attention;Problem Solve;Thought    Rehab Potential Good    Clinical Decision Making Limited treatment options, no task modification necessary    Comorbidities Affecting Occupational Performance: May have comorbidities impacting occupational performance    Modification or Assistance to Complete Evaluation  No modification of tasks or assist necessary to complete eval    OT Frequency 2x / week    OT Duration Other (comment)   x10 weeks   OT Treatment/Interventions Self-care/ADL training;Ultrasound;Energy conservation;Visual/perceptual remediation/compensation;Patient/family education;DME and/or AE instruction;Aquatic Therapy;Paraffin;Passive range of motion;Balance training;Fluidtherapy;Cryotherapy;Electrical Stimulation;Splinting;Functional Mobility Training;Moist Heat;Therapeutic exercise;Manual Therapy;Therapeutic activities;Neuromuscular education;Cognitive remediation/compensation    Plan Review/check HEP for coordination. Visual, scanning activities and pt education. Perform musti tasking as pt is  anxious to return to driving/ work.    Consulted and Agree with Plan of Care Patient             Patient will benefit from skilled therapeutic intervention in order to improve the following deficits and impairments:   Body Structure / Function / Physical Skills: ADL, UE functional use, Balance, Flexibility, Vision, FMC, ROM, Gait, GMC, Coordination, Decreased knowledge of precautions, Sensation, IADL, Decreased knowledge of use of DME, Dexterity, Strength, Mobility Cognitive Skills: Attention, Problem Solve, Thought     Visit Diagnosis: Other lack of coordination  Muscle weakness (  generalized)  Other abnormalities of gait and mobility  Other disturbances of skin sensation  Attention and concentration deficit  Frontal lobe and executive function deficit  Visuospatial deficit    Problem List Patient Active Problem List   Diagnosis Date Noted   Hypertensive emergency 51/03/2110   Acute metabolic encephalopathy 73/56/7014   ICH (intracerebral hemorrhage) ([facility]) 12/11/2020   UTI due to extended-spectrum beta lactamase (ESBL) producing Escherichia coli 01/19/2020   E coli bacteremia 01/18/2020   Severe sepsis ([facility]) 01/17/2020   AKI (acute kidney injury) ([facility]) 01/17/2020   Thrombocytopenia ([facility]) 01/17/2020   Essential hypertension 01/17/2020   Ulcerative colitis ([facility]) 01/17/2020   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 02/09/2012    Bellina Tokarczyk 01/18/2021, 2:06 PM  [facility] [address] [place], [place], [address] Phone: [phone]   Fax:  [phone]  Name: Matthew Francis MRN: [id number] Date of Birth: 1960/09/18

## 2021-01-18 NOTE — Telephone Encounter (Signed)
Matthew Francis would like to return to work ASAP. So if you have any openings he can be her stat  for a follow up visit. The front desk has been informed.

## 2021-01-22 ENCOUNTER — Ambulatory Visit: Payer: 59 | Admitting: Physical Therapy

## 2021-01-22 ENCOUNTER — Telehealth: Payer: Self-pay | Admitting: Physical Therapy

## 2021-01-22 ENCOUNTER — Ambulatory Visit: Payer: 59 | Admitting: Occupational Therapy

## 2021-01-22 ENCOUNTER — Other Ambulatory Visit: Payer: Self-pay

## 2021-01-22 DIAGNOSIS — R41840 Attention and concentration deficit: Secondary | ICD-10-CM

## 2021-01-22 DIAGNOSIS — R278 Other lack of coordination: Secondary | ICD-10-CM

## 2021-01-22 DIAGNOSIS — R2689 Other abnormalities of gait and mobility: Secondary | ICD-10-CM

## 2021-01-22 DIAGNOSIS — R208 Other disturbances of skin sensation: Secondary | ICD-10-CM

## 2021-01-22 DIAGNOSIS — R41842 Visuospatial deficit: Secondary | ICD-10-CM

## 2021-01-22 DIAGNOSIS — M6281 Muscle weakness (generalized): Secondary | ICD-10-CM

## 2021-01-22 DIAGNOSIS — R41844 Frontal lobe and executive function deficit: Secondary | ICD-10-CM

## 2021-01-22 NOTE — Therapy (Signed)
[facility] [address] [place], [place], [address] Phone: [phone]   Fax:  [phone]  Occupational Therapy Treatment  Patient Details  Name: Matthew Francis MRN: [id number] Date of Birth: 03-14-1961 Referring Provider (OT): Dr. Naaman Plummer   Encounter Date: 01/22/2021   OT End of Session - 01/22/21 1025     Visit Number 4    Number of Visits 20    Date for OT Re-Evaluation 03/20/21    Authorization Type [employer]    Authorization Time Period 10 weeks to account for scheduling    Authorization - Visit Number 4    Authorization - Number of Visits 15    OT Start Time 1022    OT Stop Time 1100    OT Time Calculation (min) 38 min    Activity Tolerance Patient tolerated treatment well    Behavior During Therapy WFL for tasks assessed/performed             Past Medical History:  Diagnosis Date   Arthritis    GERD (gastroesophageal reflux disease)    H/O ulcerative colitis    Hyperlipidemia    Hypertension    Inguinal hernia    Ulcer    ulcerative colitis   Ulcerative colitis    Wears glasses     Past Surgical History:  Procedure Laterality Date   APPENDECTOMY     COLECTOMY     Proctocolectomy with IPAA   COLECTOMY     FOREIGN BODY REMOVAL ABDOMINAL  02/17/2012   Procedure: REMOVAL FOREIGN BODY ABDOMINAL;  Surgeon: Odis Hollingshead, MD;  Location: [facility];  Service: General;  Laterality: N/A;  abdominal wound exploration and removal of foreign body   HERNIA REPAIR     Multiple incisional hernias.   ileoanal anastomosis      There were no vitals filed for this visit.   Subjective Assessment - 01/22/21 1024     Subjective  Pt reports still having some numbness and tingling but it is improving.    Patient Stated Goals improve short term memory, improve hand numbness    Currently in Pain? No/denies                          OT Treatments/Exercises (OP) - 01/22/21  1026       ADLs   Cooking reports has been cooking for a while and doing mod complex and multiple things at once and reports it is going well with no issues.    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      Cognitive Exercises   Attention Span Alternating Constant Therapy with alternating words and finding them (uppercase and lowercase) with 86% accuracy and 49.78s response time    Attention Span Divided physical and cognitive tasks with finding letters to the alphabet in sequence while tossing ball. Pt required 3 passes around to find all letters in sequenctial order and with min cues. Pt needed cues for continuing to toss ball.    Other Cognitive Exercises 1 Constant Therapy Read a Map level 1 with 100% accuracy and 25.7s response time                      OT Short Term Goals - 01/22/21 1051       OT SHORT TERM GOAL #  1   Title I with inital HEP    Time 4    Period Weeks    Status On-going    Target Date 02/06/21      OT SHORT TERM GOAL #2   Title Pt will verbalize understanding of precautions related to sensory deficits for RUE    Time 4    Period Weeks    Status Achieved      OT SHORT TERM GOAL #3   Title Pt will demonstrate ability to cook 2 items simultaneously modified indpendently demonstrating good safety awareness.    Time 4    Period Weeks    Status On-going      OT SHORT TERM GOAL #4   Title Pt will perfrom mod complex envirronmental scanning task with 90% or better accuracy in prep for work and driving    Time 4    Period Weeks    Status On-going               OT Long Term Goals - 01/22/21 1053       OT LONG TERM GOAL #1   Title Pt will perfrom simulated work activities modified independently in a reasonable amount of time.    Time 10    Period Weeks    Status On-going      OT LONG TERM GOAL #2   Title Pt will demonstrate improved fine  motor coordination for work activities as evidenced by decreasing  RUE 9 hole peg test score by 3 secs and perform without drops    Time 10    Period Weeks    Status On-going      OT LONG TERM GOAL #3   Title Pt will perfrom a physical and cognitve task simultaneously  with 90% or better accuracy in prep for driving    Time 10    Period Weeks    Status On-going                   Plan - 01/22/21 1135     Clinical Impression Statement Pt demonstrated some difficulty with overall attention to items with environmental scanning and alternating words on CT. Pt eager and motivated to get back to work and driving.    OT Occupational Profile and History Problem Focused Assessment - Including review of records relating to presenting problem    Occupational performance deficits (Please refer to evaluation for details): ADL's;IADL's;Work;Leisure;Social Participation    Body Structure / Function / Physical Skills ADL;UE functional use;Balance;Flexibility;Vision;FMC;ROM;Gait;GMC;Coordination;Decreased knowledge of precautions;Sensation;IADL;Decreased knowledge of use of DME;Dexterity;Strength;Mobility    Cognitive Skills Attention;Problem Solve;Thought    Rehab Potential Good    Clinical Decision Making Limited treatment options, no task modification necessary    Comorbidities Affecting Occupational Performance: May have comorbidities impacting occupational performance    Modification or Assistance to Complete Evaluation  No modification of tasks or assist necessary to complete eval    OT Frequency 2x / week    OT Duration Other (comment)   x10 weeks   OT Treatment/Interventions Self-care/ADL training;Ultrasound;Energy conservation;Visual/perceptual remediation/compensation;Patient/family education;DME and/or AE instruction;Aquatic Therapy;Paraffin;Passive range of motion;Balance training;Fluidtherapy;Cryotherapy;Electrical Stimulation;Splinting;Functional Mobility Training;Moist  Heat;Therapeutic exercise;Manual Therapy;Therapeutic activities;Neuromuscular education;Cognitive remediation/compensation    Plan cooking 2 items, PVC pipe tree    Consulted and Agree with Plan of Care Patient             Patient will benefit from skilled therapeutic intervention in order to improve the following deficits and impairments:   Body Structure / Function /  Physical Skills: ADL, UE functional use, Balance, Flexibility, Vision, FMC, ROM, Gait, GMC, Coordination, Decreased knowledge of precautions, Sensation, IADL, Decreased knowledge of use of DME, Dexterity, Strength, Mobility Cognitive Skills: Attention, Problem Solve, Thought     Visit Diagnosis: Visuospatial deficit  Other lack of coordination  Muscle weakness (generalized)  Other disturbances of skin sensation  Other abnormalities of gait and mobility  Attention and concentration deficit  Frontal lobe and executive function deficit    Problem List Patient Active Problem List   Diagnosis Date Noted   Hypertensive emergency 33/35/4562   Acute metabolic encephalopathy 56/38/9373   ICH (intracerebral hemorrhage) ([facility]) 12/11/2020   UTI due to extended-spectrum beta lactamase (ESBL) producing Escherichia coli 01/19/2020   E coli bacteremia 01/18/2020   Severe sepsis ([facility]) 01/17/2020   AKI (acute kidney injury) ([facility]) 01/17/2020   Thrombocytopenia ([facility]) 01/17/2020   Essential hypertension 01/17/2020   Ulcerative colitis ([facility]) 01/17/2020   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 02/09/2012    Zachery Conch MOT, OTR/L   01/22/2021, 11:36 AM  [facility] [address] [place], [place], [address] Phone: [phone]   Fax:  [phone]  Name: Matthew Francis MRN: [id number] Date of Birth: May 09, 1961

## 2021-01-22 NOTE — Telephone Encounter (Signed)
Good afternoon Dr. Naaman Plummer,  We are currently seeing [name] at neuro rehab. He is very concerned about his financial stability since he has not yet been cleared to work or drive. He very much wants an earlier appointment to see you for medical clearance and asked that I message you as well.   Thank you, Zelpha Messing April Gordy Levan, PT, DPT

## 2021-01-22 NOTE — Therapy (Signed)
[facility] [address] [place], [place], [address] Phone: [phone]   Fax:  [phone]  Physical Therapy Treatment  Patient Details  Name: Matthew Francis MRN: [id number] Date of Birth: 08/18/1960 Referring Provider (PT): Cathlyn Parsons, PA-C   Encounter Date: 01/22/2021   PT End of Session - 01/22/21 1131     Visit Number 3    Number of Visits 7    Date for PT Re-Evaluation 02/14/21    Authorization Type [employer]    PT Start Time 1105    PT Stop Time 1145    PT Time Calculation (min) 40 min    Equipment Utilized During Treatment Gait belt    Activity Tolerance Patient tolerated treatment well    Behavior During Therapy WFL for tasks assessed/performed             Past Medical History:  Diagnosis Date   Arthritis    GERD (gastroesophageal reflux disease)    H/O ulcerative colitis    Hyperlipidemia    Hypertension    Inguinal hernia    Ulcer    ulcerative colitis   Ulcerative colitis    Wears glasses     Past Surgical History:  Procedure Laterality Date   APPENDECTOMY     COLECTOMY     Proctocolectomy with IPAA   COLECTOMY     FOREIGN BODY REMOVAL ABDOMINAL  02/17/2012   Procedure: REMOVAL FOREIGN BODY ABDOMINAL;  Surgeon: Odis Hollingshead, MD;  Location: [facility];  Service: General;  Laterality: N/A;  abdominal wound exploration and removal of foreign body   HERNIA REPAIR     Multiple incisional hernias.   ileoanal anastomosis      There were no vitals filed for this visit.   Subjective Assessment - 01/22/21 1111     Subjective Pt reports not feeling as well today. He states he got put on a waitlist to see Dr. Naaman Plummer earlier. Pt reports increased soreness from last session. He states he is still sore. He was able to try a few of the exercises.    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 no issues    How long can you stand comfortably? no issues    How long can you walk comfortably? ~15 min before L LE gets too tired    Patient Stated Goals Return to work and normal function    Currently in Pain? No/denies                               [facility] Adult PT Treatment/Exercise - 01/22/21 0001       Knee/Hip Exercises: Stretches   Active Hamstring Stretch Right;Left;30 seconds   seated   Piriformis Stretch Right;Left;30 seconds    Other Knee/Hip Stretches Figure 4 stretch 2x30 sec      Knee/Hip Exercises: Aerobic   Elliptical Too sore to attempt    Tread Mill 1.8 mph to 2.5 mph x10 min; interval training with 1 min bouts of increased speed and increased elevation to 3%                 Balance Exercises - 01/22/21 0001       Balance Exercises: Standing   SLS Eyes open;30 secs    Other Standing Exercises Single leg stance  throwing bean bags forward, L & R x20 each. SLS catching bean bags forward, L & R x20 each                    PT Long Term Goals - 01/03/21 1301       PT LONG TERM GOAL #1   Title Pt will be independent with HEP and transition to community wellness    Time 6    Period Weeks    Status New    Target Date 02/14/21      PT LONG TERM GOAL #2   Title Pt will be able to demo L = R LE strength when using leg press    Baseline see Strength    Time 6    Period Weeks    Status New    Target Date 02/14/21      PT LONG TERM GOAL #3   Title Pt will have 28/28 on mini BESTest    Baseline 24/28    Time 6    Period Weeks    Status New    Target Date 02/14/21                   Plan - 01/22/21 1129     Clinical Impression Statement Pt very sore from last session -- provided stretches to address his soreness. Worked on endurance with interval training on treadmill. Continued to focus on single leg balance/stability.     Personal Factors and Comorbidities Age;Fitness;Time since onset of injury/illness/exacerbation    Examination-Activity Limitations Locomotion Level;Caring for Others;Carry    Examination-Participation Restrictions Cleaning;Occupation;Yard Work;Driving    Stability/Clinical Decision Making Stable/Uncomplicated    Rehab Potential Excellent    PT Frequency 1x / week    PT Duration 6 weeks    PT Treatment/Interventions ADLs/Self Care Home Management;Aquatic Therapy;Cryotherapy;Moist Heat;Gait training;Stair training;Functional mobility training;Therapeutic activities;Therapeutic exercise;Balance training;Neuromuscular re-education;Manual techniques;Patient/family education;Orthotic Fit/Training;Dry needling;Taping    PT Next Visit Plan Work on high level balance -- single leg stance, foam, compensatory balance strategies. Strengthening -- consider elliptical, single leg sit<>stands/wall slides/squats, squats. Endurance with walking (try unlevel surfaces outside).    PT Home Exercise Plan Access Code: [email or web address]    Consulted and Agree with Plan of Care Patient             Patient will benefit from skilled therapeutic intervention in order to improve the following deficits and impairments:  Decreased endurance, Decreased activity tolerance, Decreased balance, Decreased strength, Decreased mobility  Visit Diagnosis: Other lack of coordination  Muscle weakness (generalized)  Other abnormalities of gait and mobility     Problem List Patient Active Problem List   Diagnosis Date Noted   Hypertensive emergency 04/14/1313   Acute metabolic encephalopathy 38/88/7579   ICH (intracerebral hemorrhage) ([facility]) 12/11/2020   UTI due to extended-spectrum beta lactamase (ESBL) producing Escherichia coli 01/19/2020   E coli bacteremia 01/18/2020   Severe sepsis ([facility]) 01/17/2020   AKI (acute kidney injury) ([facility]) 01/17/2020   Thrombocytopenia ([place]) 01/17/2020   Essential hypertension 01/17/2020    Ulcerative colitis ([place]) 01/17/2020   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 02/09/2012    Jazmen Lindenbaum April Ma L Brynnly Bonet PT, DPT 01/22/2021, 11:45 AM  [facility] [address] [place], [place], [address] Phone: [phone]   Fax:  [phone]  Name: JAMARII Hughart MRN: [id number] Date of Birth: 06/03/1961

## 2021-01-25 ENCOUNTER — Ambulatory Visit: Payer: 59

## 2021-01-25 ENCOUNTER — Other Ambulatory Visit: Payer: Self-pay

## 2021-01-25 DIAGNOSIS — R208 Other disturbances of skin sensation: Secondary | ICD-10-CM | POA: Diagnosis not present

## 2021-01-25 DIAGNOSIS — R41841 Cognitive communication deficit: Secondary | ICD-10-CM

## 2021-01-25 NOTE — Therapy (Signed)
[facility] [address] [place], [place], [address] Phone: [phone]   Fax:  [phone]  Speech Language Pathology Treatment  Patient Details  Name: Matthew Francis MRN: [id number] Date of Birth: 29-Jun-1960 Referring Provider (SLP): Lauraine Rinne, [place]   Encounter Date: 01/25/2021   End of Session - 01/25/21 2257     Visit Number 6    Number of Visits 17    Date for SLP Re-Evaluation 03/08/21    SLP Start Time 46    SLP Stop Time  1145    SLP Time Calculation (min) 35 min    Activity Tolerance Patient tolerated treatment well             Past Medical History:  Diagnosis Date   Arthritis    GERD (gastroesophageal reflux disease)    H/O ulcerative colitis    Hyperlipidemia    Hypertension    Inguinal hernia    Ulcer    ulcerative colitis   Ulcerative colitis    Wears glasses     Past Surgical History:  Procedure Laterality Date   APPENDECTOMY     COLECTOMY     Proctocolectomy with IPAA   COLECTOMY     FOREIGN BODY REMOVAL ABDOMINAL  02/17/2012   Procedure: REMOVAL FOREIGN BODY ABDOMINAL;  Surgeon: Odis Hollingshead, MD;  Location: [facility];  Service: General;  Laterality: N/A;  abdominal wound exploration and removal of foreign body   HERNIA REPAIR     Multiple incisional hernias.   ileoanal anastomosis      There were no vitals filed for this visit.   Subjective Assessment - 01/25/21 1120     Subjective Pt has a job at [employer] starting Monday.    Currently in Pain? No/denies    Pain Score 4     Pain Location Abdomen                   ADULT SLP TREATMENT - 01/25/21 1122       General Information   Behavior/Cognition Alert;Cooperative;Pleasant mood      Treatment Provided   Treatment provided Cognitive-Linquistic      Cognitive-Linquistic Treatment   Treatment focused on Cognition    Skilled Treatment In divided attention tasks today pt was  95% accurate and 100% accurate with detailed task (precincts paper) and 4-5 letter words spelled, adn coin combinations.      Assessment / Recommendations / Plan   Plan Continue with current plan of care   consider d/c in next 3-4 visits     Progression Toward Goals   Progression toward goals Progressing toward goals                SLP Short Term Goals - 01/18/21 1103       SLP SHORT TERM GOAL #1   Title complete formal cognitive testing    Time 1    Period --   sessions   Status Achieved   and cont to next week   Target Date 01/18/21      SLP SHORT TERM GOAL #2   Title pt will develop a memory compensation system of choice and use for appointment tracking, and/or medication administration in 3 sessions    Baseline 01-15-21    Time 4    Period Weeks    Status On-going    Target Date 02/01/21              SLP Long  Term Goals - 01/25/21 2258       SLP LONG TERM GOAL #1   Title Pt will use memory compensations for tracking medication, appointment tracking/scheduling, to-do lists, etc between 7 sessions    Baseline 01-15-21, 01-18-21, 01-25-21    Time 8    Period Weeks    Status On-going      SLP LONG TERM GOAL #2   Title pt will demonstrate divided attentoin between a simple linguistic task and another task for 10 minutes, in 3 sessions    Time 8    Period Weeks    Status On-going      SLP LONG TERM GOAL #3   Title pt will demo attention to details to self correct for 100% success in fucntional cognitive linguistic tasks in 3 sessions    Time 8    Period Weeks    Status On-going              Plan - 01/25/21 2257     Clinical Impression Statement Matthew Francis cont to present today with some improving short term memory - and with improving attention to details (highlighted by performance with CLQT)/attention skills.  Pt endorses he has difficulty with attention to detail. This was seen last session as pt put two of one med into a med box well that he should have  only put one. He caught his error when he double-checked his work. Divided attention seen today as 95-100% acurrate. Pt would cont to benefit from skilled ST targeting cognitive linguistic deficits.    Speech Therapy Frequency 2x / week    Duration 8 weeks    Treatment/Interventions Functional tasks;Compensatory techniques;Cueing hierarchy;SLP instruction and feedback;Patient/family education;Internal/external aids    Potential to Achieve Goals Good    Consulted and Agree with Plan of Care Patient             Patient will benefit from skilled therapeutic intervention in order to improve the following deficits and impairments:   Cognitive communication deficit    Problem List Patient Active Problem List   Diagnosis Date Noted   Hypertensive emergency 94/71/2527   Acute metabolic encephalopathy 12/92/9090   ICH (intracerebral hemorrhage) ([facility]) 12/11/2020   UTI due to extended-spectrum beta lactamase (ESBL) producing Escherichia coli 01/19/2020   E coli bacteremia 01/18/2020   Severe sepsis ([facility]) 01/17/2020   AKI (acute kidney injury) ([facility]) 01/17/2020   Thrombocytopenia ([facility]) 01/17/2020   Essential hypertension 01/17/2020   Ulcerative colitis ([facility]) 01/17/2020   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 02/09/2012    Deyna Carbon. ,[place], [place]  01/25/2021, 11:00 PM  [facility] [address] [place], [place], [address] Phone: [phone]   Fax:  [phone]   Name: Matthew Francis MRN: [id number] Date of Birth: Oct 26, 1960

## 2021-01-29 ENCOUNTER — Other Ambulatory Visit: Payer: Self-pay

## 2021-01-29 ENCOUNTER — Ambulatory Visit: Payer: 59

## 2021-01-29 ENCOUNTER — Ambulatory Visit: Payer: 59 | Admitting: Occupational Therapy

## 2021-01-29 VITALS — BP 108/67 | HR 80

## 2021-01-29 DIAGNOSIS — M6281 Muscle weakness (generalized): Secondary | ICD-10-CM

## 2021-01-29 DIAGNOSIS — R41844 Frontal lobe and executive function deficit: Secondary | ICD-10-CM

## 2021-01-29 DIAGNOSIS — R208 Other disturbances of skin sensation: Secondary | ICD-10-CM

## 2021-01-29 DIAGNOSIS — R2689 Other abnormalities of gait and mobility: Secondary | ICD-10-CM

## 2021-01-29 DIAGNOSIS — R41840 Attention and concentration deficit: Secondary | ICD-10-CM

## 2021-01-29 DIAGNOSIS — R41841 Cognitive communication deficit: Secondary | ICD-10-CM

## 2021-01-29 DIAGNOSIS — R278 Other lack of coordination: Secondary | ICD-10-CM

## 2021-01-29 DIAGNOSIS — R41842 Visuospatial deficit: Secondary | ICD-10-CM

## 2021-01-29 NOTE — Therapy (Signed)
[facility] [address] [place], [place], [address] Phone: [phone]   Fax:  [phone]  Occupational Therapy Treatment  Patient Details  Name: Matthew Francis MRN: [id number] Date of Birth: 07-17-1960 Referring Provider (OT): Dr. Naaman Plummer   Encounter Date: 01/29/2021   OT End of Session - 01/29/21 1138     Visit Number 5    Number of Visits 20    Date for OT Re-Evaluation 03/20/21    Authorization Type [employer]    Authorization Time Period 10 weeks to account for scheduling    Authorization - Visit Number 5    Authorization - Number of Visits 15    OT Start Time 1020    OT Stop Time 1100    OT Time Calculation (min) 40 min    Activity Tolerance Patient tolerated treatment well    Behavior During Therapy WFL for tasks assessed/performed             Past Medical History:  Diagnosis Date   Arthritis    GERD (gastroesophageal reflux disease)    H/O ulcerative colitis    Hyperlipidemia    Hypertension    Inguinal hernia    Ulcer    ulcerative colitis   Ulcerative colitis    Wears glasses     Past Surgical History:  Procedure Laterality Date   APPENDECTOMY     COLECTOMY     Proctocolectomy with IPAA   COLECTOMY     FOREIGN BODY REMOVAL ABDOMINAL  02/17/2012   Procedure: REMOVAL FOREIGN BODY ABDOMINAL;  Surgeon: Odis Hollingshead, MD;  Location: [facility];  Service: General;  Laterality: N/A;  abdominal wound exploration and removal of foreign body   HERNIA REPAIR     Multiple incisional hernias.   ileoanal anastomosis      There were no vitals filed for this visit.   Subjective Assessment - 01/29/21 1137     Subjective  Pt reports numbness and tingling is improving.    Patient Stated Goals improve short term memory, improve hand numbness    Currently in Pain? No/denies                   Treatment: Scrambling an egg while making a piece of toast in the toaster  oven, pt demonstrates good safety awareness.  Only 1 v.c to turn toaster oven on as pt was unfamiliar with toaster oven and does not use at home. Pt turned off stove and located items without prompts. Pt report cooking several items at a time at home without difficulty.  Pipetree design x 2 with 100% accuracy.  Alternating attention task :to copy small peg design in standing with RUE for improved fine motor coordination and endurance, while monitoring time, pt was instructed to go retrieve cards from hallway every  2 mins. Initally pt missed first several cards because of decreased attention to detail. Pt was 5-10 secs late searching for cards each time because of not fully attending to time. Pt located all cards in sequence following initial errors.                OT Short Term Goals - 01/29/21 1055       OT SHORT TERM GOAL #1   Status Achieved   met for coordination HEP     OT SHORT TERM GOAL #2   Status Achieved      OT SHORT TERM GOAL #3   Title Pt will demonstrate ability  to cook 2 items simultaneously modified indpendently demonstrating good safety awareness.    Status Achieved      OT SHORT TERM GOAL #4   Title Pt will perfrom mod complex envirronmental scanning task with 90% or better accuracy in prep for work and driving    Status On-going               OT Long Term Goals - 01/29/21 1057       OT LONG TERM GOAL #1   Title Pt will perfrom simulated work activities modified independently in a reasonable amount of time.    Time 10    Period Weeks    Status On-going      OT LONG TERM GOAL #2   Title Pt will demonstrate improved fine motor coordination for work activities as evidenced by decreasing  RUE 9 hole peg test score by 3 secs and perform without drops    Time 10    Period Weeks    Status On-going      OT LONG TERM GOAL #3   Title Pt will perfrom a physical and cognitve task simultaneously  with 90% or better accuracy in prep for driving     Time 10    Period Weeks    Status On-going                   Plan - 01/29/21 1139     Clinical Impression Statement Pt is progressing towards goals.He continues to demonstrate difficulties with alternating attention which could impact work activities and driving.    OT Occupational Profile and History Problem Focused Assessment - Including review of records relating to presenting problem    Occupational performance deficits (Please refer to evaluation for details): ADL's;IADL's;Work;Leisure;Social Participation    Body Structure / Function / Physical Skills ADL;UE functional use;Balance;Flexibility;Vision;FMC;ROM;Gait;GMC;Coordination;Decreased knowledge of precautions;Sensation;IADL;Decreased knowledge of use of DME;Dexterity;Strength;Mobility    Cognitive Skills Attention;Problem Solve;Thought    Rehab Potential Good    Clinical Decision Making Limited treatment options, no task modification necessary    Comorbidities Affecting Occupational Performance: May have comorbidities impacting occupational performance    Modification or Assistance to Complete Evaluation  No modification of tasks or assist necessary to complete eval    OT Frequency 2x / week    OT Duration Other (comment)   x10 weeks   OT Treatment/Interventions Self-care/ADL training;Ultrasound;Energy conservation;Visual/perceptual remediation/compensation;Patient/family education;DME and/or AE instruction;Aquatic Therapy;Paraffin;Passive range of motion;Balance training;Fluidtherapy;Cryotherapy;Electrical Stimulation;Splinting;Functional Mobility Training;Moist Heat;Therapeutic exercise;Manual Therapy;Therapeutic activities;Neuromuscular education;Cognitive remediation/compensation    Plan continue to address simulated work activities, alternating attention, standing tolerance, environmental scanning    Consulted and Agree with Plan of Care Patient             Patient will benefit from skilled therapeutic  intervention in order to improve the following deficits and impairments:   Body Structure / Function / Physical Skills: ADL, UE functional use, Balance, Flexibility, Vision, FMC, ROM, Gait, GMC, Coordination, Decreased knowledge of precautions, Sensation, IADL, Decreased knowledge of use of DME, Dexterity, Strength, Mobility Cognitive Skills: Attention, Problem Solve, Thought     Visit Diagnosis: Muscle weakness (generalized)  Visuospatial deficit  Other lack of coordination  Other disturbances of skin sensation  Attention and concentration deficit  Frontal lobe and executive function deficit    Problem List Patient Active Problem List   Diagnosis Date Noted   Hypertensive emergency 93/73/4287   Acute metabolic encephalopathy 68/04/5725   ICH (intracerebral hemorrhage) ([facility]) 12/11/2020   UTI due to extended-spectrum beta  lactamase (ESBL) producing Escherichia coli 01/19/2020   E coli bacteremia 01/18/2020   Severe sepsis ([facility]) 01/17/2020   AKI (acute kidney injury) ([facility]) 01/17/2020   Thrombocytopenia ([facility]) 01/17/2020   Essential hypertension 01/17/2020   Ulcerative colitis ([facility]) 01/17/2020   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 02/09/2012    Nabria Nevin 01/29/2021, 11:41 AM  [facility] [address] [place], [place], [address] Phone: [phone]   Fax:  [phone]  Name: Matthew Francis MRN: [id number] Date of Birth: Apr 16, 1961

## 2021-01-29 NOTE — Therapy (Signed)
[facility] [address] [place], [place], [address] Phone: [phone]   Fax:  [phone]  Physical Therapy Treatment  Patient Details  Name: Matthew Francis MRN: [id number] Date of Birth: [birth year] Referring Provider (PT): Cathlyn Parsons, PA-C   Encounter Date: 01/29/2021   PT End of Session - 01/29/21 0932     Visit Number 4    Number of Visits 7    Date for PT Re-Evaluation 02/14/21    Authorization Type [employer]    PT Start Time [phone]    PT Stop Time 1013    PT Time Calculation (min) 40 min    Equipment Utilized During Treatment Gait belt    Activity Tolerance Patient tolerated treatment well    Behavior During Therapy WFL for tasks assessed/performed             Past Medical History:  Diagnosis Date   Arthritis    GERD (gastroesophageal reflux disease)    H/O ulcerative colitis    Hyperlipidemia    Hypertension    Inguinal hernia    Ulcer    ulcerative colitis   Ulcerative colitis    Wears glasses     Past Surgical History:  Procedure Laterality Date   APPENDECTOMY     COLECTOMY     Proctocolectomy with IPAA   COLECTOMY     FOREIGN BODY REMOVAL ABDOMINAL  02/17/2012   Procedure: REMOVAL FOREIGN BODY ABDOMINAL;  Surgeon: Odis Hollingshead, MD;  Location: [facility];  Service: General;  Laterality: N/A;  abdominal wound exploration and removal of foreign body   HERNIA REPAIR     Multiple incisional hernias.   ileoanal anastomosis      Vitals:   01/29/21 0939  BP: 108/67  Pulse: 80     Subjective Assessment - 01/29/21 0935     Subjective Patient reports that feels the energy level has not been very good this last few days. Patient reports some general soreness from prior sessions.    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 no issues    How long can you stand comfortably? no issues    How long can you walk comfortably? ~15 min before L LE gets too tired    Patient Stated Goals Return to work and normal function    Currently in Pain? No/denies               [facility] Adult PT Treatment/Exercise - 01/29/21 0001       Ambulation/Gait   Ambulation/Gait Yes    Ambulation/Gait Assistance 6: Modified independent (Device/Increase time)    Assistive device None    Gait Pattern Within Functional Limits    Ambulation Surface Level;Indoor    Gait Comments ambulation throughout therapy gym with activities      Neuro Re-ed    Neuro Re-ed Details  Completed SLS with medicine ball toss to rebounder 2 x 10 reps with SLS on LLE , then alternated legs and completed 2 x 10 reps with SLS on RLE. Increased challenge with stance on RLE noted. CGA intermittently.      Exercises   Exercises Knee/Hip      Knee/Hip Exercises: Aerobic   Other Aerobic Due to fatigue, patient reequesting seated warm up vs treadmill. Completed SciFit with BUE/BLE on Level 2.0 x 6 minutes for improved strengthening/activity tolerance. Patient  tolerating well.                 Balance Exercises - 01/29/21 0001       Balance Exercises: Standing   SLS with Vectors Solid surface;Intermittent upper extremity assist;Limitations    SLS with Vectors Limitations completed alternating step up onto rockerboard positioned A/P with opposite LE tapping to cone forwards, completed x 10 reps bilaterally without UE suppport. Increased challenge with SLS on RLE > LLE.    Rockerboard Anterior/posterior;Lateral;Head turns;EO;EC;Intermittent UE support;Limitations    Rockerboard Limitations on rockerboard A/P: completed standing without UE support holding board steady with eyes closed 3 x 30 seconds, then with eyes open completed horizontal/vertical head turns x 10 reps each direction. completed all of the following second set with board  positioned lateral. incerased challenge with board lateral > A/P. Then continued with board lateral, completed lateral weight shifts to R/L x 10 reps working on control. intermittent standing rest breaks requried.                    PT Long Term Goals - 01/03/21 1301       PT LONG TERM GOAL #1   Title Pt will be independent with HEP and transition to community wellness    Time 6    Period Weeks    Status New    Target Date 02/14/21      PT LONG TERM GOAL #2   Title Pt will be able to demo L = R LE strength when using leg press    Baseline see Strength    Time 6    Period Weeks    Status New    Target Date 02/14/21      PT LONG TERM GOAL #3   Title Pt will have 28/28 on mini BESTest    Baseline 24/28    Time 6    Period Weeks    Status New    Target Date 02/14/21                   Plan - 01/29/21 1610     Clinical Impression Statement Patient continues to have some soreness and fatigue limiting activities in session. Continued focus on strengthening, balance on complaint surfaces, and activity tolerance with intermittent rest breaks throughout session. Will continue per POC.    Personal Factors and Comorbidities Age;Fitness;Time since onset of injury/illness/exacerbation    Examination-Activity Limitations Locomotion Level;Caring for Others;Carry    Examination-Participation Restrictions Cleaning;Occupation;Yard Work;Driving    Stability/Clinical Decision Making Stable/Uncomplicated    Rehab Potential Excellent    PT Frequency 1x / week    PT Duration 6 weeks    PT Treatment/Interventions ADLs/Self Care Home Management;Aquatic Therapy;Cryotherapy;Moist Heat;Gait training;Stair training;Functional mobility training;Therapeutic activities;Therapeutic exercise;Balance training;Neuromuscular re-education;Manual techniques;Patient/family education;Orthotic Fit/Training;Dry needling;Taping    PT Next Visit Plan Work on high level balance -- single leg stance,  foam, compensatory balance strategies. Strengthening -- consider elliptical, single leg sit<>stands/wall slides/squats, squats. Endurance with walking (try unlevel surfaces outside).    PT Home Exercise Plan Access Code: [email or web address]    Consulted and Agree with Plan of Care Patient             Patient will benefit from skilled therapeutic intervention in order to improve the following deficits and impairments:  Decreased endurance, Decreased activity tolerance, Decreased balance, Decreased strength, Decreased mobility  Visit Diagnosis: Muscle weakness (generalized)  Other abnormalities of gait and mobility     Problem List Patient Active Problem List  Diagnosis Date Noted   Hypertensive emergency 18/28/8337   Acute metabolic encephalopathy 44/51/4604   ICH (intracerebral hemorrhage) ([place]) 12/11/2020   UTI due to extended-spectrum beta lactamase (ESBL) producing Escherichia coli 01/19/2020   E coli bacteremia 01/18/2020   Severe sepsis ([place]) 01/17/2020   AKI (acute kidney injury) ([place]) 01/17/2020   Thrombocytopenia ([place]) 01/17/2020   Essential hypertension 01/17/2020   Ulcerative colitis ([place]) 01/17/2020   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 02/09/2012    Jones Bales, PT, DPT 01/29/2021, 10:14 AM  [facility] [address] [place], [place], [address] Phone: [phone]   Fax:  [phone]  Name: Matthew Francis MRN: [id number] Date of Birth: [birth year]

## 2021-01-29 NOTE — Telephone Encounter (Signed)
Matthew Francis will not see his PCP until October. He has a follow up with you on 04/03/2021. If possible will you refill his requested medications?   Thank you

## 2021-01-29 NOTE — Therapy (Signed)
[facility] [address] [place], [place], [address] Phone: [phone]   Fax:  [phone]  Speech Language Pathology Treatment  Patient Details  Name: Matthew Francis MRN: [id number] Date of Birth: 03-21-1961 Referring Provider (SLP): Lauraine Rinne, [place]   Encounter Date: 01/29/2021   End of Session - 01/29/21 1033     Visit Number 7    Number of Visits 17    Date for SLP Re-Evaluation 03/08/21    SLP Start Time 41    SLP Stop Time  3716    SLP Time Calculation (min) 45 min    Activity Tolerance Patient tolerated treatment well             Past Medical History:  Diagnosis Date   Arthritis    GERD (gastroesophageal reflux disease)    H/O ulcerative colitis    Hyperlipidemia    Hypertension    Inguinal hernia    Ulcer    ulcerative colitis   Ulcerative colitis    Wears glasses     Past Surgical History:  Procedure Laterality Date   APPENDECTOMY     COLECTOMY     Proctocolectomy with IPAA   COLECTOMY     FOREIGN BODY REMOVAL ABDOMINAL  02/17/2012   Procedure: REMOVAL FOREIGN BODY ABDOMINAL;  Surgeon: Odis Hollingshead, MD;  Location: [facility];  Service: General;  Laterality: N/A;  abdominal wound exploration and removal of foreign body   HERNIA REPAIR     Multiple incisional hernias.   ileoanal anastomosis      There were no vitals filed for this visit.   Subjective Assessment - 01/29/21 1044     Subjective "it's fine" re: current cognitive functioning    Currently in Pain? No/denies                   ADULT SLP TREATMENT - 01/29/21 1032       General Information   Behavior/Cognition Alert;Cooperative;Pleasant mood      Treatment Provided   Treatment provided Cognitive-Linquistic      Cognitive-Linquistic Treatment   Treatment focused on Cognition    Skilled Treatment Some inconsistent verbal responses noted in initial opening conversation, which required  intermittent SLP clarifications (ex: yes then no, if patient is working currently, if we have met before). Today is supposedly first day of job, but pt plans to call out due to fatigue and start tomorrow. SLP targeted divided attention tasks with 2 seperate written tasks (sequence 26 steps in day and budget a grocery list given specifics) and bombardment of auditory stimuli (SLP prompting conversation intermittently and ongoing background noises). Pt completed tasks with 100% acuracy and 77% accuracy respectively. Occasional self-corrections noted in completion of tasks. SLP provided min verbal and visual cues for attention to detail x3 on grocery store task, despite use of double check. No overt difficulty reported with pill organizer at home; however, pt is awaiting for some prescriptions to be re-filled. Pt stated he is managing appointments "for the most part" due to occasionally late transportation which is out of his control. Pt expressed some agitation with not yet returning to driving.      Assessment / Recommendations / Plan   Plan Continue with current plan of care      Progression Toward Goals   Progression toward goals Progressing toward goals              SLP Education - 01/29/21 1112  Education Details divided attention tasks, attention to detail    Person(s) Educated Patient    Methods Explanation    Comprehension Verbalized understanding              SLP Short Term Goals - 01/29/21 1033       SLP SHORT TERM GOAL #1   Title complete formal cognitive testing    Period --   sessions   Status Achieved   and cont to next week   Target Date 01/18/21      SLP SHORT TERM GOAL #2   Title pt will develop a memory compensation system of choice and use for appointment tracking, and/or medication administration in 3 sessions    Baseline 01-15-21, 01-29-21    Time 4    Period Weeks    Status On-going    Target Date 02/01/21              SLP Long Term Goals -  01/29/21 1034       SLP LONG TERM GOAL #1   Title Pt will use memory compensations for tracking medication, appointment tracking/scheduling, to-do lists, etc between 7 sessions    Baseline 01-15-21, 01-18-21, 01-25-21, 01-29-21    Time 8    Period Weeks    Status On-going    Target Date 03/08/21      SLP LONG TERM GOAL #2   Title pt will demonstrate divided attentoin between a simple linguistic task and another task for 10 minutes, in 3 sessions    Baseline 01-29-21    Time 8    Period Weeks    Status On-going    Target Date 03/08/21      SLP LONG TERM GOAL #3   Title pt will demo attention to details to self correct for 100% success in fucntional cognitive linguistic tasks in 3 sessions    Time 8    Period Weeks    Status On-going    Target Date 03/08/21              Plan - 01/29/21 1033     Clinical Impression Statement [place] cont to present today with some improving short term memory - and with improving attention to details (highlighted by performance with CLQT)/attention skills.  SLP targeted divided attention and attention to detail tasks. Divided attention and attention to details seen today as 79-100% accurate. Pt would cont to benefit from skilled ST targeting cognitive linguistic deficits.    Speech Therapy Frequency 2x / week    Duration 8 weeks    Treatment/Interventions Functional tasks;Compensatory techniques;Cueing hierarchy;SLP instruction and feedback;Patient/family education;Internal/external aids    Potential to Achieve Goals Good    Consulted and Agree with Plan of Care Patient             Patient will benefit from skilled therapeutic intervention in order to improve the following deficits and impairments:   Cognitive communication deficit    Problem List Patient Active Problem List   Diagnosis Date Noted   Hypertensive emergency 79/07/4095   Acute metabolic encephalopathy 35/32/9924   ICH (intracerebral hemorrhage) ([place]) 12/11/2020   UTI due to  extended-spectrum beta lactamase (ESBL) producing Escherichia coli 01/19/2020   E coli bacteremia 01/18/2020   Severe sepsis ([facility]) 01/17/2020   AKI (acute kidney injury) ([place]) 01/17/2020   Thrombocytopenia ([place]) 01/17/2020   Essential hypertension 01/17/2020   Ulcerative colitis ([place]) 01/17/2020   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 02/09/2012    Alinda Deem, MA  CCC-SLP 01/29/2021, 11:51 AM  [facility] [address] [place], [place], [address] Phone: [phone]   Fax:  [phone]   Name: ISIDORE MARGRAF MRN: [id number] Date of Birth: [birth year]

## 2021-01-30 ENCOUNTER — Encounter: Payer: 59 | Attending: Registered Nurse | Admitting: Physical Medicine & Rehabilitation

## 2021-01-30 VITALS — BP 113/76 | HR 90 | Temp 98.9°F | Ht 71.0 in | Wt 217.0 lb

## 2021-01-30 DIAGNOSIS — G9341 Metabolic encephalopathy: Secondary | ICD-10-CM | POA: Diagnosis not present

## 2021-01-30 DIAGNOSIS — I610 Nontraumatic intracerebral hemorrhage in hemisphere, subcortical: Secondary | ICD-10-CM | POA: Insufficient documentation

## 2021-01-30 MED ORDER — RIFAXIMIN 550 MG PO TABS: 550.0000 mg | ORAL_TABLET | Freq: Two times a day (BID) | ORAL | 4 refills | Status: AC

## 2021-01-30 MED ORDER — ROSUVASTATIN CALCIUM 10 MG PO TABS: 10.0000 mg | ORAL_TABLET | ORAL | 4 refills | Status: DC

## 2021-01-30 MED ORDER — ESOMEPRAZOLE MAGNESIUM 20 MG PO CPDR: 20.0000 mg | DELAYED_RELEASE_CAPSULE | Freq: Every day | ORAL | 4 refills | Status: DC

## 2021-01-30 MED ORDER — HYDRALAZINE HCL 100 MG PO TABS: 100.0000 mg | ORAL_TABLET | Freq: Two times a day (BID) | ORAL | 4 refills | Status: DC

## 2021-01-30 MED ORDER — METHOCARBAMOL 500 MG PO TABS: 500.0000 mg | ORAL_TABLET | Freq: Three times a day (TID) | ORAL | 4 refills | Status: DC | PRN

## 2021-01-30 MED ORDER — BUDESONIDE 3 MG PO CPEP: 9.0000 mg | ORAL_CAPSULE | Freq: Every day | ORAL | 4 refills | Status: DC

## 2021-01-30 NOTE — Patient Instructions (Signed)
YOU MAY RETURN TO WORK AND DRIVING AS TOLERATED.

## 2021-01-30 NOTE — Progress Notes (Signed)
Subjective:    Patient ID: Matthew Francis, male    DOB: 15-Apr-1961, 60 y.o.   MRN: [id number]  HPI This is a follow-up visit for Mr. 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  He was discharged from inpatient rehab on January 01, 2021. He's been working in outpt therapy with slp and pt on higher level functioning.  He's independent at home. He's cooking and cleaning daily.    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   He has had improvement in the dexterity and sensation of his left arm/hand. He has only minimal residual sensory loss along the lateral pinky.   His mood has been a little anxious and depressed as he's running out of money and wants to get back to work. He feels guilty relying on his parents.     Pain Inventory Average Pain 3 Pain Right Now 3 My pain is intermittent and dull  LOCATION OF PAIN  HEAD , FINGERS, ABDOMEN  BOWEL Number of stools per week: 40-50 Oral laxative use No  Type of laxative N/A Enema or suppository use No  History of colostomy Yes  Incontinent Yes   BLADDER Normal  Able to self cath No  Bladder incontinence No  Frequent urination No  Leakage with coughing No  Difficulty starting stream No  Incomplete bladder emptying No    Mobility walk without assistance how many minutes can you walk? LONG PERIOD OF TIME ability to climb steps?  yes do you drive?  no  Function not employed: date last employed June 2022  Neuro/Psych depression  Prior Studies N/A  Physicians involved in your care N/A   Family History  Problem Relation Age of Onset   Cancer Sister    Social History   Socioeconomic History   Marital status: Single    Spouse name: Not on file   Number of children: Not on file   Years of education: Not on file   Highest education level: Not on file  Occupational History   Not on  file  Tobacco Use   Smoking status: Never   Smokeless tobacco: Never  Vaping Use   Vaping Use: Never used  Substance and Sexual Activity   Alcohol use: Yes    Alcohol/week: 4.0 standard drinks    Types: 4 Cans of beer per week    Comment: 4 cans of beer per week   Drug use: No   Sexual activity: Not Currently  Other Topics Concern   Not on file  Social History Narrative   Not on file   Social Determinants of Health   Financial Resource Strain: Not on file  Food Insecurity: Not on file  Transportation Needs: Not on file  Physical Activity: Not on file  Stress: Not on file  Social Connections: Not on file   Past Surgical History:  Procedure Laterality Date   APPENDECTOMY     COLECTOMY     Proctocolectomy with IPAA   COLECTOMY     FOREIGN BODY REMOVAL ABDOMINAL  02/17/2012   Procedure: [place];  Surgeon: Odis Hollingshead, MD;  Location: [facility];  Service: General;  Laterality: N/A;  abdominal wound exploration and removal of foreign body   HERNIA REPAIR     Multiple incisional hernias.   ileoanal anastomosis  Past Medical History:  Diagnosis Date   Arthritis    GERD (gastroesophageal reflux disease)    H/O ulcerative colitis    Hyperlipidemia    Hypertension    Inguinal hernia    Ulcer    ulcerative colitis   Ulcerative colitis    Wears glasses    BP 113/76   Pulse 90   Temp 98.9 F (37.2 C) (Oral)   Ht 5' 11"  (1.803 m)   Wt 217 lb (98.4 kg)   SpO2 97%   BMI 30.27 kg/m   Opioid Risk Score:   Fall Risk Score:  `1  Depression screen PHQ 2/9  Depression screen PHQ 2/9 01/11/2021  Decreased Interest 1  Down, Depressed, Hopeless 1  PHQ - 2 Score 2  Altered sleeping 1  Tired, decreased energy 1  Change in appetite 0  Feeling bad or failure about yourself  1  Trouble concentrating 0  Moving slowly or fidgety/restless 0  Suicidal thoughts 0  PHQ-9 Score 5      Review of Systems  Constitutional: Negative.    HENT: Negative.    Eyes: Negative.   Respiratory: Negative.    Cardiovascular: Negative.   Gastrointestinal:  Positive for abdominal pain, diarrhea and nausea.  Endocrine: Negative.   Genitourinary: Negative.   Musculoskeletal:        PAIN IN FINGERS  Skin:  Positive for rash.  Allergic/Immunologic: Negative.   Neurological: Negative.   Hematological: Negative.   Psychiatric/Behavioral:  Positive for dysphoric mood.       Objective:   Physical Exam Gen: no distress, normal appearing HEENT: oral mucosa pink and moist, NCAT Cardio: Reg rate Chest: normal effort, normal rate of breathing Abd: soft, non-distended Ext: no edema Psych: pleasant, normal affect. Very talkative. Skin: intact Neuro:Alert and oriented x 3. Reasonable insight and awareness. Intact Memory. Normal language and speech. Cranial nerve exam unremarkable. Strength 5/5 in all 4's. Normal balance, gait. Sl sensory loss tip of left 5th finger Musculoskeletal: Full ROM, No pain with AROM or PROM in the neck, trunk, or extremities. Posture appropriate        Assessment & Plan:   1.  Altered mental status with nausea and vomiting secondary to right caudate head ICH with IVH likely secondary to hypertensive emergency cocaine use             may resume driving  -may resume work as tolerated 2.  Pain Management: headaches have resolved.   -may stop topamax 3. Mood: ego support of family -getting back to work and driving should help him a lot              4.  Hypertension.  Clonidine 01 mg daily, Norvasc 10 mg daily, hydralazine 100 mg twice daily.  Monitor with increased mobility             -BP under control at present 7/18 5.  Ulcerative colitis.Xifaxan 550 mg twice daily, Entocort 9 mg daily             -near baseline 6. Type 2 diabetes mellitus.  Hemoglobin A1c 5.9.  SSI.   -sugars well controlled in low 100's           Fifteen minutes of face to face patient care time were spent during this visit. All  questions were encouraged and answered.  Follow up with me prn

## 2021-02-01 ENCOUNTER — Telehealth: Payer: Self-pay

## 2021-02-01 ENCOUNTER — Other Ambulatory Visit: Payer: Self-pay

## 2021-02-01 ENCOUNTER — Encounter: Payer: Self-pay | Admitting: Occupational Therapy

## 2021-02-01 ENCOUNTER — Ambulatory Visit: Payer: 59 | Admitting: Occupational Therapy

## 2021-02-01 ENCOUNTER — Ambulatory Visit: Payer: 59

## 2021-02-01 DIAGNOSIS — M6281 Muscle weakness (generalized): Secondary | ICD-10-CM

## 2021-02-01 DIAGNOSIS — R208 Other disturbances of skin sensation: Secondary | ICD-10-CM

## 2021-02-01 DIAGNOSIS — R278 Other lack of coordination: Secondary | ICD-10-CM

## 2021-02-01 DIAGNOSIS — R41842 Visuospatial deficit: Secondary | ICD-10-CM

## 2021-02-01 DIAGNOSIS — R2689 Other abnormalities of gait and mobility: Secondary | ICD-10-CM

## 2021-02-01 DIAGNOSIS — R41841 Cognitive communication deficit: Secondary | ICD-10-CM

## 2021-02-01 DIAGNOSIS — R41844 Frontal lobe and executive function deficit: Secondary | ICD-10-CM

## 2021-02-01 DIAGNOSIS — R41840 Attention and concentration deficit: Secondary | ICD-10-CM

## 2021-02-01 NOTE — Therapy (Signed)
[facility] [address] [place], [place], [address] Phone: [phone]   Fax:  [phone]  Speech Language Pathology Treatment  Patient Details  Name: Matthew Francis MRN: [id number] Date of Birth: [birth year] Referring Provider (SLP): Lauraine Rinne, [place]   Encounter Date: 02/01/2021   End of Session - 02/01/21 1258     Visit Number 8    Number of Visits 17    Date for SLP Re-Evaluation 03/08/21    SLP Start Time 1104    SLP Stop Time  1145    SLP Time Calculation (min) 41 min    Activity Tolerance Patient tolerated treatment well             Past Medical History:  Diagnosis Date   Arthritis    GERD (gastroesophageal reflux disease)    H/O ulcerative colitis    Hyperlipidemia    Hypertension    Inguinal hernia    Ulcer    ulcerative colitis   Ulcerative colitis    Wears glasses     Past Surgical History:  Procedure Laterality Date   APPENDECTOMY     COLECTOMY     Proctocolectomy with IPAA   COLECTOMY     FOREIGN BODY REMOVAL ABDOMINAL  02/17/2012   Procedure: REMOVAL FOREIGN BODY ABDOMINAL;  Surgeon: Odis Hollingshead, MD;  Location: [facility];  Service: General;  Laterality: N/A;  abdominal wound exploration and removal of foreign body   HERNIA REPAIR     Multiple incisional hernias.   ileoanal anastomosis      There were no vitals filed for this visit.   Subjective Assessment - 02/01/21 1115     Subjective Pt started at [facility] yesterday. Dr. Naaman Plummer has cleared pt to drive and work on 07-15-84.    Currently in Pain? No/denies                   ADULT SLP TREATMENT - 02/01/21 1116       General Information   Behavior/Cognition Alert;Cooperative;Pleasant mood      Treatment Provided   Treatment provided Cognitive-Linquistic      Cognitive-Linquistic Treatment   Treatment focused on Cognition    Skilled Treatment "I just stepped up and did (the work) - I was  expecting to maybe get overwhelmed (because it stayed busy the whole shift). They knew about my stroke and they said, 'if you need to go sit down you can but I never did."' SLP worked today on linguistic divided attention tasks - written progress slowed when auditory info started. Pt with 97% success with auditory attention and 100% success with written task - much better than previous sessions.      Assessment / Recommendations / Plan   Plan Continue with current plan of care   d/c in next 1-2 weeks     Progression Toward Goals   Progression toward goals Progressing toward goals                SLP Short Term Goals - 02/01/21 1259       SLP SHORT TERM GOAL #1   Title complete formal cognitive testing    Period --   sessions   Status Achieved   and cont to next week   Target Date 01/18/21      SLP SHORT TERM GOAL #2   Title pt will develop a memory compensation system of choice and use for appointment tracking, and/or medication administration in 3 sessions  Baseline 01-15-21, 01-29-21    Status Achieved    Target Date 02/01/21              SLP Long Term Goals - 02/01/21 1300       SLP LONG TERM GOAL #1   Title Pt will use memory compensations for tracking medication, appointment tracking/scheduling, to-do lists, etc between 7 sessions    Baseline 01-15-21, 01-18-21, 01-25-21, 01-29-21, 02-01-21    Time 8    Period Weeks    Status On-going      SLP LONG TERM GOAL #2   Title pt will demonstrate divided attention between a simple linguistic task and another task for 10 minutes, in 3 sessions    Baseline 01-29-21    Time 8    Period Weeks    Status On-going      SLP LONG TERM GOAL #3   Title pt will demo attention to details to self correct for 100% success in fucntional cognitive linguistic tasks in 3 sessions    Time 8    Period Weeks    Status On-going              Plan - 02/01/21 1258     Clinical Impression Statement Jerod cont to present today with some  improving short term memory - and with improving attention to details (highlighted by performance with CLQT)/attention skills.  SLP targeted divided attention and attention to detail tasks - pt's performance was better than in previous two sessions. Divided attention and attention to details seen today as 97-100% accurate in both tasks. Pt would cont to benefit from skilled ST targeting cognitive linguistic deficits.    Speech Therapy Frequency 2x / week    Duration 8 weeks    Treatment/Interventions Functional tasks;Compensatory techniques;Cueing hierarchy;SLP instruction and feedback;Patient/family education;Internal/external aids    Potential to Achieve Goals Good    Consulted and Agree with Plan of Care Patient             Patient will benefit from skilled therapeutic intervention in order to improve the following deficits and impairments:   Cognitive communication deficit    Problem List Patient Active Problem List   Diagnosis Date Noted   Hypertensive emergency 94/49/6759   Acute metabolic encephalopathy 16/38/4665   ICH (intracerebral hemorrhage) ([facility]) 12/11/2020   UTI due to extended-spectrum beta lactamase (ESBL) producing Escherichia coli 01/19/2020   E coli bacteremia 01/18/2020   Severe sepsis ([place]) 01/17/2020   AKI (acute kidney injury) ([facility]) 01/17/2020   Thrombocytopenia ([facility] [place]) 01/17/2020   Essential hypertension 01/17/2020   Ulcerative colitis ([facility]) 01/17/2020   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 02/09/2012    Matthew Francis ,MS, CCC-SLP  02/01/2021, 1:01 PM  [facility] [address] [place], [place], [address] Phone: [phone]   Fax:  [phone]   Name: Matthew Francis MRN: [id number] Date of Birth: 1960/07/08

## 2021-02-01 NOTE — Therapy (Signed)
[facility] [address] [place], [place], [address] Phone: [phone]   Fax:  [phone]  Occupational Therapy Treatment  Patient Details  Name: Matthew Francis MRN: [id number] Date of Birth: October 07, 1960 Referring Provider (OT): Dr. Naaman Plummer   Encounter Date: 02/01/2021   OT End of Session - 02/01/21 1018     Visit Number 6    Number of Visits 20    Date for OT Re-Evaluation 03/20/21    Authorization Type [employer]    Authorization Time Period 10 weeks to account for scheduling    Authorization - Visit Number 6    Authorization - Number of Visits 15    OT Start Time 1016    OT Stop Time 1100    OT Time Calculation (min) 44 min    Activity Tolerance Patient tolerated treatment well    Behavior During Therapy WFL for tasks assessed/performed             Past Medical History:  Diagnosis Date   Arthritis    GERD (gastroesophageal reflux disease)    H/O ulcerative colitis    Hyperlipidemia    Hypertension    Inguinal hernia    Ulcer    ulcerative colitis   Ulcerative colitis    Wears glasses     Past Surgical History:  Procedure Laterality Date   APPENDECTOMY     COLECTOMY     Proctocolectomy with IPAA   COLECTOMY     FOREIGN BODY REMOVAL ABDOMINAL  02/17/2012   Procedure: REMOVAL FOREIGN BODY ABDOMINAL;  Surgeon: Odis Hollingshead, MD;  Location: [facility];  Service: General;  Laterality: N/A;  abdominal wound exploration and removal of foreign body   HERNIA REPAIR     Multiple incisional hernias.   ileoanal anastomosis      There were no vitals filed for this visit.   Subjective Assessment - 02/01/21 1018     Subjective  "things are looking up"    Patient Stated Goals improve short term memory, improve hand numbness    Currently in Pain? No/denies                          OT Treatments/Exercises (OP) - 02/01/21 1028       ADLs   Driving pt has now been  cleared for driving without limitations from MD. OT reviewed return to driving graduated program with patient. Verbalized understanding. Although patient is cleared and has been driving independently, OT expressed concern with driving d/t some difficulty with attention and especially divided and alternating attention. Pt verbalized he was driving at night but not on busy highways yet.    Work Returned to work last night for first shift. Pt reported did not have as much fatigue as he originally thought. Pt worked about 7 hours and had a 15 minute break. Reports no issues.      Cognitive Exercises   Problem Solving Functional problem solving worksheet with organizing day:    Attention Span Other Alternating between 2 tasks (24 pc puzzle and functional problem solving organizing day) every 2 minutes. Pt would sometimes be off schedule by approx 30-40 seconds d/t distraction with therapist asking questions, working on task, etc.      Product manager Integration 24 pc puzzle (rescue squad): mod cues for organizing pieces and eliminating distractions by limiting amount of  pieces on board.  OT Short Term Goals - 02/01/21 1043       OT SHORT TERM GOAL #1   Title I with inital HEP    Time 4    Period Weeks    Status Achieved   met for coordination HEP   Target Date 02/06/21      OT SHORT TERM GOAL #2   Title Pt will verbalize understanding of precautions related to sensory deficits for RUE    Time 4    Period Weeks    Status Achieved      OT SHORT TERM GOAL #3   Title Pt will demonstrate ability to cook 2 items simultaneously modified indpendently demonstrating good safety awareness.    Status Achieved      OT SHORT TERM GOAL #4   Title Pt will perfrom mod complex envirronmental scanning task with 90% or better accuracy in prep for work and driving    Status On-going               OT Long Term Goals - 01/29/21 1057        OT LONG TERM GOAL #1   Title Pt will perfrom simulated work activities modified independently in a reasonable amount of time.    Time 10    Period Weeks    Status On-going      OT LONG TERM GOAL #2   Title Pt will demonstrate improved fine motor coordination for work activities as evidenced by decreasing  RUE 9 hole peg test score by 3 secs and perform without drops    Time 10    Period Weeks    Status On-going      OT LONG TERM GOAL #3   Title Pt will perfrom a physical and cognitve task simultaneously  with 90% or better accuracy in prep for driving    Time 10    Period Weeks    Status On-going                   Plan - 02/01/21 1159     Clinical Impression Statement Pt continues to have some difficulty with alternating attention that could impact driving and/or work activities however some improvement noted today.    OT Occupational Profile and History Problem Focused Assessment - Including review of records relating to presenting problem    Occupational performance deficits (Please refer to evaluation for details): ADL's;IADL's;Work;Leisure;Social Participation    Body Structure / Function / Physical Skills ADL;UE functional use;Balance;Flexibility;Vision;FMC;ROM;Gait;GMC;Coordination;Decreased knowledge of precautions;Sensation;IADL;Decreased knowledge of use of DME;Dexterity;Strength;Mobility    Cognitive Skills Attention;Problem Solve;Thought    Rehab Potential Good    Clinical Decision Making Limited treatment options, no task modification necessary    Comorbidities Affecting Occupational Performance: May have comorbidities impacting occupational performance    Modification or Assistance to Complete Evaluation  No modification of tasks or assist necessary to complete eval    OT Frequency 2x / week    OT Duration Other (comment)   x10 weeks   OT Treatment/Interventions Self-care/ADL training;Ultrasound;Energy conservation;Visual/perceptual  remediation/compensation;Patient/family education;DME and/or AE instruction;Aquatic Therapy;Paraffin;Passive range of motion;Balance training;Fluidtherapy;Cryotherapy;Electrical Stimulation;Splinting;Functional Mobility Training;Moist Heat;Therapeutic exercise;Manual Therapy;Therapeutic activities;Neuromuscular education;Cognitive remediation/compensation    Plan continue to address simulated work activities, alternating attention, standing tolerance, environmental scanning    Consulted and Agree with Plan of Care Patient             Patient will benefit from skilled therapeutic intervention in order to improve the following deficits and impairments:   Body Structure / Function /  Physical Skills: ADL, UE functional use, Balance, Flexibility, Vision, FMC, ROM, Gait, GMC, Coordination, Decreased knowledge of precautions, Sensation, IADL, Decreased knowledge of use of DME, Dexterity, Strength, Mobility Cognitive Skills: Attention, Problem Solve, Thought     Visit Diagnosis: Other abnormalities of gait and mobility  Muscle weakness (generalized)  Visuospatial deficit  Other lack of coordination  Other disturbances of skin sensation  Attention and concentration deficit  Frontal lobe and executive function deficit    Problem List Patient Active Problem List   Diagnosis Date Noted   Hypertensive emergency 75/79/7282   Acute metabolic encephalopathy 11/14/5613   ICH (intracerebral hemorrhage) ([facility]) 12/11/2020   UTI due to extended-spectrum beta lactamase (ESBL) producing Escherichia coli 01/19/2020   E coli bacteremia 01/18/2020   Severe sepsis ([facility]) 01/17/2020   AKI (acute kidney injury) ([facility]) 01/17/2020   Thrombocytopenia ([facility]) 01/17/2020   Essential hypertension 01/17/2020   Ulcerative colitis ([facility]) 01/17/2020   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 02/09/2012    Zachery Conch MOT, OTR/L  02/01/2021, 12:00 PM  [facility] [address] [place], [place], [address] Phone: [phone]   Fax:  [phone]  Name: Matthew Francis MRN: [id number] Date of Birth: Dec 22, 1960

## 2021-02-01 NOTE — Patient Instructions (Signed)
RETURN TO DRIVING PLAN:   Communicate with physician first and get clearance before beginning any driving.   Once cleared:  WITH THE SUPERVISION OF A LICENSED DRIVER, PLEASE DRIVE IN AN EMPTY PARKING LOT FOR AT LEAST 2-3 TRIALS TO TEST REACTION TIME, VISION, USE OF EQUIPMENT IN CAR, ETC.   IF SUCCESSFUL WITH THE PARKING LOT DRIVING, PROCEED TO SUPERVISED DRIVING TRIALS IN YOUR NEIGHBORHOOD STREETS AT LOW TRAFFIC TIMES TO TEST OBSERVATION TO TRAFFIC SIGNALS, REACTION TIME, ETC. PLEASE ATTEMPT AT LEAST 2-3 TRIALS IN YOUR NEIGHBORHOOD.   IF NEIGHBORHOOD DRIVING IS SUCCESSFUL, YOU MAY PROCEED TO DRIVING IN BUSIER AREAS IN YOUR COMMUNITY WITH SUPERVISION OF A LICENSED DRIVER. PLEASE ATTEMPT AT LEAST 4-5 TRIALS.   IF COMMUNITY DRIVING IS SUCCESSFUL, YOU MAY PROCEED TO DRIVING ALONE, DURING THE DAY TIME, IN NON-PEAK TRAFFIC TIMES. YOU SHOULD DRIVE NO FURTHER THAN 15 MINUTES IN ONE DIRECTION. PLEASE DO NOT DRIVE IF YOU FEEL FATIGUED OR UNDER THE INFLUENCE OF MEDICATION.

## 2021-02-01 NOTE — Telephone Encounter (Signed)
PA for Esomeprazole has been submitted. Will need to complete paper form. Form to be faxed to [employer].

## 2021-02-04 NOTE — Telephone Encounter (Signed)
Not able to ger Rx approved in [facility]. Patient is aware it is available over the counter in tablet form.

## 2021-02-05 ENCOUNTER — Ambulatory Visit: Payer: 59

## 2021-02-05 ENCOUNTER — Other Ambulatory Visit: Payer: Self-pay

## 2021-02-05 ENCOUNTER — Ambulatory Visit: Payer: 59 | Admitting: Occupational Therapy

## 2021-02-05 VITALS — BP 132/86

## 2021-02-05 DIAGNOSIS — R208 Other disturbances of skin sensation: Secondary | ICD-10-CM

## 2021-02-05 DIAGNOSIS — R41842 Visuospatial deficit: Secondary | ICD-10-CM

## 2021-02-05 DIAGNOSIS — R278 Other lack of coordination: Secondary | ICD-10-CM

## 2021-02-05 DIAGNOSIS — M6281 Muscle weakness (generalized): Secondary | ICD-10-CM

## 2021-02-05 DIAGNOSIS — R41844 Frontal lobe and executive function deficit: Secondary | ICD-10-CM

## 2021-02-05 DIAGNOSIS — R2689 Other abnormalities of gait and mobility: Secondary | ICD-10-CM

## 2021-02-05 DIAGNOSIS — R41840 Attention and concentration deficit: Secondary | ICD-10-CM

## 2021-02-05 DIAGNOSIS — R41841 Cognitive communication deficit: Secondary | ICD-10-CM

## 2021-02-05 NOTE — Therapy (Signed)
[facility] [address] [place], [place], [address] Phone: [phone]   Fax:  [phone]  Physical Therapy Treatment  Patient Details  Name: Matthew Francis MRN: [id number] Date of Birth: Dec 01, 1960 Referring Provider (PT): Cathlyn Parsons, PA-C   Encounter Date: 02/05/2021   PT End of Session - 02/05/21 0001     Visit Number 5    Number of Visits 7    Date for PT Re-Evaluation 02/14/21    Authorization Type [employer]    PT Start Time [phone]   patient arriving late   PT Stop Time 1014    PT Time Calculation (min) 36 min    Activity Tolerance Patient limited by fatigue    Behavior During Therapy [facility] for tasks assessed/performed             Past Medical History:  Diagnosis Date   Arthritis    GERD (gastroesophageal reflux disease)    H/O ulcerative colitis    Hyperlipidemia    Hypertension    Inguinal hernia    Ulcer    ulcerative colitis   Ulcerative colitis    Wears glasses     Past Surgical History:  Procedure Laterality Date   APPENDECTOMY     COLECTOMY     Proctocolectomy with IPAA   COLECTOMY     FOREIGN BODY REMOVAL ABDOMINAL  02/17/2012   Procedure: REMOVAL FOREIGN BODY ABDOMINAL;  Surgeon: Odis Hollingshead, MD;  Location: [facility];  Service: General;  Laterality: N/A;  abdominal wound exploration and removal of foreign body   HERNIA REPAIR     Multiple incisional hernias.   ileoanal anastomosis      Vitals:   02/05/21 0945  BP: 132/86     Subjective Assessment - 02/05/21 0940     Subjective Patient reports appt with PCP went well. Very exhausted this morning due to working last night until 2am. Reports also having some pain in the stomach due to colitis.    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 no  issues    How long can you stand comfortably? no issues    How long can you walk comfortably? ~15 min before L LE gets too tired    Patient Stated Goals Return to work and normal function    Currently in Pain? Yes    Pain Score 4     Pain Location Other (Comment)   Stomach   Pain Orientation Anterior    Pain Descriptors / Indicators Cramping    Pain Type Acute pain    Pain Onset Yesterday    Pain Frequency Intermittent    Aggravating Factors  lack of sleep;    Pain Relieving Factors medicine               [facility] Adult PT Treatment/Exercise - 02/05/21 0001       Ambulation/Gait   Ambulation/Gait Yes    Ambulation/Gait Assistance 6: Modified independent (Device/Increase time)    Assistive device None    Gait Pattern Within Functional Limits    Ambulation Surface Level;Indoor    Gait Comments throughout therapy gym with activities      Neuro Re-ed    Neuro Re-ed Details  Standing on inverted BOSU: completed lateral weight shifts x 10 reps without UE support, then A/P weight shifts x 10 reps without UE support, cues  for control. Then completed eyes closed holding BOSU steady 3 x 25-30 seconds CGA required.      Exercises   Exercises Knee/Hip      Knee/Hip Exercises: Aerobic   Other Aerobic Completed SciFit with BUE/BLE on Level 2.5 x 6 minutes for improved strengthening/activity tolerance. Patient tolerating well.               Balance Exercises - 02/05/21 0001       Balance Exercises: Standing   Other Standing Exercises with BOSU completed alternating steps up with opposite LE knee drive x 10 reps bilaterally, alteranting BLE with completion. UE support required intermittently from // bars and close supervision from PT. Then progressed to completed vertical head turns 2 x 10 reps, then followed by horizontal head turns 2 x 10 reps. Increased challenge with horizontal > vertical               PT Education - 02/05/21 0951     Education Details reaching out to  Dr. Naaman Plummer regarding accomodates/breaks for return to work    [employer]) Educated Patient    Methods Explanation    Comprehension Verbalized understanding                 PT Long Term Goals - 01/03/21 1301       PT LONG TERM GOAL #1   Title Pt will be independent with HEP and transition to community wellness    Time 6    Period Weeks    Status New    Target Date 02/14/21      PT LONG TERM GOAL #2   Title Pt will be able to demo L = R LE strength when using leg press    Baseline see Strength    Time 6    Period Weeks    Status New    Target Date 02/14/21      PT LONG TERM GOAL #3   Title Pt will have 28/28 on mini BESTest    Baseline 24/28    Time 6    Period Weeks    Status New    Target Date 02/14/21                   Plan - 02/05/21 0950     Clinical Impression Statement Today's session limited due to fatigue, as patient has returned to work. Continued session focused on activity tolerance and balance activities to patient's tolerance. Intermittent rest breaks required due to [place]. Will continue to progress toward all LTGs.    Personal Factors and Comorbidities Age;Fitness;Time since onset of injury/illness/exacerbation    Examination-Activity Limitations Locomotion Level;Caring for Others;Carry    Examination-Participation Restrictions Cleaning;Occupation;Yard Work;Driving    Stability/Clinical Decision Making Stable/Uncomplicated    Rehab Potential Excellent    PT Frequency 1x / week    PT Duration 6 weeks    PT Treatment/Interventions ADLs/Self Care Home Management;Aquatic Therapy;Cryotherapy;Moist Heat;Gait training;Stair training;Functional mobility training;Therapeutic activities;Therapeutic exercise;Balance training;Neuromuscular re-education;Manual techniques;Patient/family education;Orthotic Fit/Training;Dry needling;Taping    PT Next Visit Plan Work on high level balance -- single leg stance, foam, compensatory balance strategies.  Strengthening -- consider elliptical, single leg sit<>stands/wall slides/squats, squats. Endurance with walking (try unlevel surfaces outside).    PT Home Exercise Plan Access Code: [email or web address]    Consulted and Agree with Plan of Care Patient             Patient will benefit from skilled therapeutic intervention in order to improve the following deficits and  impairments:  Decreased endurance, Decreased activity tolerance, Decreased balance, Decreased strength, Decreased mobility  Visit Diagnosis: Other abnormalities of gait and mobility  Muscle weakness (generalized)     Problem List Patient Active Problem List   Diagnosis Date Noted   Hypertensive emergency 19/59/7471   Acute metabolic encephalopathy 85/50/1586   ICH (intracerebral hemorrhage) ([facility]) 12/11/2020   UTI due to extended-spectrum beta lactamase (ESBL) producing Escherichia coli 01/19/2020   E coli bacteremia 01/18/2020   Severe sepsis ([facility]) 01/17/2020   AKI (acute kidney injury) ([facility]) 01/17/2020   Thrombocytopenia ([place]) 01/17/2020   Essential hypertension 01/17/2020   Ulcerative colitis ([facility]) 01/17/2020   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 02/09/2012    Jones Bales, PT, DPT 02/05/2021, 10:15 AM  [facility] [address] [place], [place], [address] Phone: [phone]   Fax:  [phone]  Name: Matthew Francis MRN: [id number] Date of Birth: 12/10/1960

## 2021-02-05 NOTE — Therapy (Signed)
[facility] [address] [place], [place], [address] Phone: [phone]   Fax:  [phone]  Speech Language Pathology Treatment  Patient Details  Name: Matthew Francis MRN: [id number] Date of Birth: 1960/11/26 Referring Provider (SLP): Lauraine Rinne, [place]   Encounter Date: 02/05/2021   End of Session - 02/05/21 1632     Visit Number 9    Number of Visits 17    Date for SLP Re-Evaluation 03/08/21    SLP Start Time 1105    SLP Stop Time  1145    SLP Time Calculation (min) 40 min    Activity Tolerance Patient tolerated treatment well             Past Medical History:  Diagnosis Date   Arthritis    GERD (gastroesophageal reflux disease)    H/O ulcerative colitis    Hyperlipidemia    Hypertension    Inguinal hernia    Ulcer    ulcerative colitis   Ulcerative colitis    Wears glasses     Past Surgical History:  Procedure Laterality Date   APPENDECTOMY     COLECTOMY     Proctocolectomy with IPAA   COLECTOMY     FOREIGN BODY REMOVAL ABDOMINAL  02/17/2012   Procedure: REMOVAL FOREIGN BODY ABDOMINAL;  Surgeon: Odis Hollingshead, MD;  Location: [facility];  Service: General;  Laterality: N/A;  abdominal wound exploration and removal of foreign body   HERNIA REPAIR     Multiple incisional hernias.   ileoanal anastomosis      There were no vitals filed for this visit.   Subjective Assessment - 02/05/21 1122     Subjective Pt reports having to multi task at work    Currently in Pain? Yes    Pain Score 4     Pain Location --   "my colitis is acting up"   Pain Descriptors / Indicators Cramping;Aching    Pain Type Acute pain                   ADULT SLP TREATMENT - 02/05/21 1117       General Information   Behavior/Cognition Alert;Cooperative;Pleasant mood      Treatment Provided   Treatment provided Cognitive-Linquistic      Cognitive-Linquistic Treatment   Treatment  focused on Cognition    Skilled Treatment "I'm so tired I can't think straight. 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 They have asked him to work more hours due to liking his work. SLP targeted divided attention wiht written and auditory language today. In simple conversation pt was able to complete both tasks 100% wihtout pausing. In more emotionally involved or deeper linguistic conversation pt paused his written work to respond even though he was told to complete written task wihtout pause. SLP suspects some higher level attention deficits. SLP inquired what pt was thinking about next visits - that SLP may not be able to remediate fully the higher level deficits in this setting - pt stated he was comfortable with d/c on 02-12-21 - SLP agreed.      Assessment / Recommendations / Plan   Plan Continue with current plan of care      Progression Toward Goals   Progression toward goals Progressing toward goals  SLP Short Term Goals - 02/01/21 1259       SLP SHORT TERM GOAL #1   Title complete formal cognitive testing    Period --   sessions   Status Achieved   and cont to next week   Target Date 01/18/21      SLP SHORT TERM GOAL #2   Title pt will develop a memory compensation system of choice and use for appointment tracking, and/or medication administration in 3 sessions    Baseline 01-15-21, 01-29-21    Status Achieved    Target Date 02/01/21              SLP Long Term Goals - 02/05/21 1634       SLP LONG TERM GOAL #1   Title Pt will use memory compensations for tracking medication, appointment tracking/scheduling, to-do lists, etc between 7 sessions    Baseline 01-15-21, 01-18-21, 01-25-21, 01-29-21, 02-01-21, 02-05-21    Time 8    Period Weeks    Status On-going    Target Date 03/08/21       SLP LONG TERM GOAL #2   Title pt will demonstrate divided attention between a simple linguistic task and another task for 10 minutes, in 3 sessions    Baseline 01-29-21, 02-05-21    Time 8    Period Weeks    Status On-going    Target Date 03/08/21      SLP LONG TERM GOAL #3   Title pt will demo attention to details to self correct for 100% success in fucntional cognitive linguistic tasks in 3 sessions    Baseline 02-05-21    Time 8    Period Weeks    Status On-going              Plan - 02/05/21 1632     Clinical Impression Statement Calven cont to present today with some improving short term memory - and with improving attention to details and divided attention. Pt agrees his attention has improved but is not 100%. SLP again targeted divided attention and attention to detail tasks - pt's performance was very good today, with written task and conversastion with SLP. Pt has returned to work and driving and agrees that 1-2 more sessions are appropriate. Pt would cont to benefit from 1-2 more skilled ST targeting cognitive linguistic deficits.    Speech Therapy Frequency 2x / week    Duration 8 weeks    Treatment/Interventions Functional tasks;Compensatory techniques;Cueing hierarchy;SLP instruction and feedback;Patient/family education;Internal/external aids    Potential to Achieve Goals Good    Consulted and Agree with Plan of Care Patient             Patient will benefit from skilled therapeutic intervention in order to improve the following deficits and impairments:   Cognitive communication deficit    Problem List Patient Active Problem List   Diagnosis Date Noted   Hypertensive emergency 44/31/5400   Acute metabolic encephalopathy 86/76/1950   ICH (intracerebral hemorrhage) ([facility]) 12/11/2020   UTI due to extended-spectrum beta lactamase (ESBL) producing Escherichia coli 01/19/2020   E coli bacteremia 01/18/2020   Severe sepsis ([facility]) 01/17/2020   AKI (acute kidney  injury) ([facility]) 01/17/2020   Thrombocytopenia ([facility]) 01/17/2020   Essential hypertension 01/17/2020   Ulcerative colitis ([facility]) 01/17/2020   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 02/09/2012    Joanann Mies ,MS, CCC-SLP  02/05/2021, 4:35 PM  [facility] [address] [place], [place], [address] Phone:  [phone]   Fax:  [phone]   Name: COLTER MAGOWAN MRN: [id number] Date of Birth: Oct 15, 1960

## 2021-02-05 NOTE — Therapy (Signed)
[facility] [address] [place], [place], [address] Phone: [phone]   Fax:  [phone]  Occupational Therapy Treatment  Patient Details  Name: Matthew Francis MRN: [id number] Date of Birth: [birth year] Referring Provider (OT): Dr. Naaman Plummer   Encounter Date: 02/05/2021   OT End of Session - 02/05/21 1152     Visit Number 7    Number of Visits 20    Date for OT Re-Evaluation 03/20/21    Authorization Type [employer]    Authorization Time Period 10 weeks to account for scheduling    Authorization - Visit Number 7    Authorization - Number of Visits 15    OT Start Time 5361    OT Stop Time 1058    OT Time Calculation (min) 40 min    Activity Tolerance Patient tolerated treatment well    Behavior During Therapy WFL for tasks assessed/performed             Past Medical History:  Diagnosis Date   Arthritis    GERD (gastroesophageal reflux disease)    H/O ulcerative colitis    Hyperlipidemia    Hypertension    Inguinal hernia    Ulcer    ulcerative colitis   Ulcerative colitis    Wears glasses     Past Surgical History:  Procedure Laterality Date   APPENDECTOMY     COLECTOMY     Proctocolectomy with IPAA   COLECTOMY     FOREIGN BODY REMOVAL ABDOMINAL  02/17/2012   Procedure: REMOVAL FOREIGN BODY ABDOMINAL;  Surgeon: Odis Hollingshead, MD;  Location: [facility];  Service: General;  Laterality: N/A;  abdominal wound exploration and removal of foreign body   HERNIA REPAIR     Multiple incisional hernias.   ileoanal anastomosis      There were no vitals filed for this visit.   Subjective Assessment - 02/05/21 1153     Subjective  stomach pain    Patient Stated Goals improve short term memory, improve hand numbness    Currently in Pain? Yes    Pain Score 4     Pain Location Abdomen    Pain Descriptors / Indicators Cramping    Pain Type Acute pain    Pain Onset Yesterday     Aggravating Factors  lack of sleep    Pain Relieving Factors meds                             Treatment: Environmental scanning task with a cognitive component to locate items sequentially. 11/12 items located on first pass in min distracting environment 92% Alternating attention task, to organize a grocery list , while alternately performing map task. Pt completed map task with 80% accuracy and pt forgot to cross items off grocery list when organized. Discussed with pt plans to wrap up in the next week or 2. Pt reports he has returned to work without issues. Pt may desire to wrap up sooner.  Alternating attention task: alphabetizing words level 3 constant therapy with 90% accuracy and increased time.       OT Short Term Goals - 02/05/21 1020       OT SHORT TERM GOAL #1   Title I with inital HEP    Time 4    Period Weeks    Status Achieved   met for coordination HEP   Target Date 02/06/21  OT SHORT TERM GOAL #2   Title Pt will verbalize understanding of precautions related to sensory deficits for RUE    Time 4    Period Weeks    Status Achieved      OT SHORT TERM GOAL #3   Title Pt will demonstrate ability to cook 2 items simultaneously modified indpendently demonstrating good safety awareness.    Status Achieved      OT SHORT TERM GOAL #4   Title Pt will perfrom mod complex envirronmental scanning task with 90% or better accuracy in prep for work and driving    Status On-going   pt performed with 92 % accuracy on 02/05/21, check one more time for consistency              OT Long Term Goals - 02/05/21 1020       OT LONG TERM GOAL #1   Title Pt will perfrom simulated work activities modified independently in a reasonable amount of time.    Time 10    Period Weeks    Status Achieved   has returned to work and pt reports no issues     OT LONG TERM GOAL #2   Title Pt will demonstrate improved fine motor coordination for work activities as  evidenced by decreasing  RUE 9 hole peg test score by 3 secs and perform without drops    Baseline 26.13    Time 10    Period Weeks    Status On-going   25.87, however no drops     OT LONG TERM GOAL #3   Title Pt will perfrom a physical and cognitve task simultaneously  with 90% or better accuracy in prep for driving    Time 10    Period Weeks    Status On-going                   Plan - 02/05/21 1150     Clinical Impression Statement Pt is progressing towards goals. He demonstrates continued deficits with alternating attention, however he reports it is not impacting work activities.    OT Occupational Profile and History Problem Focused Assessment - Including review of records relating to presenting problem    Occupational performance deficits (Please refer to evaluation for details): ADL's;IADL's;Work;Leisure;Social Participation    Body Structure / Function / Physical Skills ADL;UE functional use;Balance;Flexibility;Vision;FMC;ROM;Gait;GMC;Coordination;Decreased knowledge of precautions;Sensation;IADL;Decreased knowledge of use of DME;Dexterity;Strength;Mobility    Cognitive Skills Attention;Problem Solve;Thought    Rehab Potential Good    Clinical Decision Making Limited treatment options, no task modification necessary    Comorbidities Affecting Occupational Performance: May have comorbidities impacting occupational performance    Modification or Assistance to Complete Evaluation  No modification of tasks or assist necessary to complete eval    OT Frequency 2x / week    OT Duration Other (comment)   x10 weeks   OT Treatment/Interventions Self-care/ADL training;Ultrasound;Energy conservation;Visual/perceptual remediation/compensation;Patient/family education;DME and/or AE instruction;Aquatic Therapy;Paraffin;Passive range of motion;Balance training;Fluidtherapy;Cryotherapy;Electrical Stimulation;Splinting;Functional Mobility Training;Moist Heat;Therapeutic exercise;Manual  Therapy;Therapeutic activities;Neuromuscular education;Cognitive remediation/compensation    Plan re-assess mod complex visual scanning for consistency, work towards remaining goals, pt requests d/c 02/12/21    Consulted and Agree with Plan of Care Patient             Patient will benefit from skilled therapeutic intervention in order to improve the following deficits and impairments:   Body Structure / Function / Physical Skills: ADL, UE functional use, Balance, Flexibility, Vision, FMC, ROM, Gait, GMC, Coordination, Decreased knowledge of  precautions, Sensation, IADL, Decreased knowledge of use of DME, Dexterity, Strength, Mobility Cognitive Skills: Attention, Problem Solve, Thought     Visit Diagnosis: Muscle weakness (generalized)  Visuospatial deficit  Other lack of coordination  Other disturbances of skin sensation  Attention and concentration deficit  Frontal lobe and executive function deficit    Problem List Patient Active Problem List   Diagnosis Date Noted   Hypertensive emergency 19/75/8832   Acute metabolic encephalopathy 54/98/2641   ICH (intracerebral hemorrhage) ([facility]) 12/11/2020   UTI due to extended-spectrum beta lactamase (ESBL) producing Escherichia coli 01/19/2020   E coli bacteremia 01/18/2020   Severe sepsis ([facility]) 01/17/2020   AKI (acute kidney injury) ([facility]) 01/17/2020   Thrombocytopenia ([facility]) 01/17/2020   Essential hypertension 01/17/2020   Ulcerative colitis ([facility]) 01/17/2020   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 02/09/2012    Kellye Mizner 02/05/2021, 11:53 AM Theone Murdoch, OTR/L [phone] Phone: [phone] 11:54 AM 02/05/21  [facility] [address] [place], [place], [address] Phone: [phone]   Fax:  [phone]  Name: Matthew Francis MRN: [id number] Date of Birth: 1961-03-12

## 2021-02-07 ENCOUNTER — Ambulatory Visit
Admit: 2021-02-07 | Discharge: 2021-02-08 | Payer: PRIVATE HEALTH INSURANCE | Attending: Gastroenterology | Primary: Gastroenterology

## 2021-02-07 DIAGNOSIS — K580 Irritable bowel syndrome with diarrhea: Principal | ICD-10-CM

## 2021-02-07 DIAGNOSIS — G9340 Encephalopathy, unspecified: Principal | ICD-10-CM

## 2021-02-07 MED ORDER — DICYCLOMINE 10 MG CAPSULE
ORAL_CAPSULE | Freq: Four times a day (QID) | ORAL | 2 refills | 30.00000 days | Status: CP
Start: 2021-02-07 — End: 2022-02-07

## 2021-02-07 MED ORDER — LOPERAMIDE 2 MG CAPSULE
ORAL_CAPSULE | Freq: Four times a day (QID) | ORAL | 3 refills | 90 days | Status: CP
Start: 2021-02-07 — End: 2022-02-07

## 2021-02-08 ENCOUNTER — Telehealth: Payer: Self-pay

## 2021-02-08 ENCOUNTER — Encounter: Payer: Self-pay | Admitting: Occupational Therapy

## 2021-02-08 ENCOUNTER — Other Ambulatory Visit: Payer: Self-pay

## 2021-02-08 ENCOUNTER — Ambulatory Visit: Payer: 59

## 2021-02-08 ENCOUNTER — Ambulatory Visit: Payer: 59 | Admitting: Occupational Therapy

## 2021-02-08 DIAGNOSIS — R208 Other disturbances of skin sensation: Secondary | ICD-10-CM

## 2021-02-08 DIAGNOSIS — M6281 Muscle weakness (generalized): Secondary | ICD-10-CM

## 2021-02-08 DIAGNOSIS — R41842 Visuospatial deficit: Secondary | ICD-10-CM

## 2021-02-08 DIAGNOSIS — R41844 Frontal lobe and executive function deficit: Secondary | ICD-10-CM

## 2021-02-08 DIAGNOSIS — R278 Other lack of coordination: Secondary | ICD-10-CM

## 2021-02-08 DIAGNOSIS — R41840 Attention and concentration deficit: Secondary | ICD-10-CM

## 2021-02-08 DIAGNOSIS — R41841 Cognitive communication deficit: Secondary | ICD-10-CM

## 2021-02-08 NOTE — Telephone Encounter (Signed)
[place] would like [employer] to have tried & failed 3 alternatives, with documented proof of failure of:  Esomeprazole 40 mg cap Lansoprazole 30 MG cap Omeprazole 10, 20, 40 mg cap Pantoprazole  20, 40 mg tab Rebeprazole 20 mg tab  Before approving Esomeprazole Mag 20 MG Capsules. (Esomeprazole tablets are OTC).  Please advise. Thank you

## 2021-02-08 NOTE — Therapy (Signed)
[facility] [address] [place], [place], [address] Phone: [phone]   Fax:  [phone]  Speech Language Pathology Treatment  Patient Details  Name: Matthew Francis MRN: [id number] Date of Birth: 12/25/1960 Referring Provider (SLP): Matthew Francis, [place]   Encounter Date: 02/08/2021   End of Session - 02/08/21 1142     Visit Number 10    Number of Visits 17    Date for SLP Re-Evaluation 03/08/21    SLP Start Time 77    SLP Stop Time  1100    SLP Time Calculation (min) 42 min    Activity Tolerance Patient tolerated treatment well             Past Medical History:  Diagnosis Date   Arthritis    GERD (gastroesophageal reflux disease)    H/O ulcerative colitis    Hyperlipidemia    Hypertension    Inguinal hernia    Ulcer    ulcerative colitis   Ulcerative colitis    Wears glasses     Past Surgical History:  Procedure Laterality Date   APPENDECTOMY     COLECTOMY     Proctocolectomy with IPAA   COLECTOMY     FOREIGN BODY REMOVAL ABDOMINAL  02/17/2012   Procedure: REMOVAL FOREIGN BODY ABDOMINAL;  Surgeon: Odis Hollingshead, MD;  Location: [facility];  Service: General;  Laterality: N/A;  abdominal wound exploration and removal of foreign body   HERNIA REPAIR     Multiple incisional hernias.   ileoanal anastomosis      There were no vitals filed for this visit.   Subjective Assessment - 02/08/21 1140     Subjective pt looks at work schedule - working tomorrow and not today. 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    Currently in Pain? 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 Pt thought to put an alarm in his phone for when he uses the pill pack again. Attention tasks today pt started doing divided attention task after >1 minute. Pt was able to name artist/band names of songs on radio as he was unscrambling sentences. Accuracy with both tasks (writtten and auditory) were 100% and 90% respectively. Told pt that we would likely decr to once/week next week due to progress. He agreed.      Assessment / Recommendations / Plan   Plan Continue with current plan of care      Progression Toward Goals   Progression toward goals Progressing toward goals                SLP Short Term Goals - 02/01/21 1259       SLP SHORT TERM GOAL #1   Title complete formal cognitive testing    Period --   sessions   Status Achieved  and cont to next week   Target Date 01/18/21      SLP SHORT TERM GOAL #2   Title pt will develop a memory compensation system of choice and use for appointment tracking, and/or medication administration in 3 sessions    Baseline 01-15-21, 01-29-21    Status Achieved    Target Date 02/01/21              SLP Long Term Goals - 02/08/21 1144       SLP LONG TERM GOAL #1   Title Pt will use memory compensations for tracking medication, appointment tracking/scheduling, to-do lists, etc between 7 sessions    Baseline 01-15-21, 01-18-21, 01-25-21, 01-29-21, 02-01-21, 02-05-21    Status Achieved      SLP LONG TERM GOAL #2   Title pt will demonstrate divided attention between a simple linguistic task and another task for 10 minutes, in 3 sessions    Baseline 01-29-21, 02-05-21    Status Achieved      SLP LONG TERM GOAL #3   Title pt will demo attention to details to self correct for 100% success in fucntional cognitive linguistic  tasks in 3 sessions    Baseline 02-05-21    Time 8    Period Weeks    Status On-going              Plan - 02/08/21 1143     Clinical Impression Statement Matthew Francis cont to present today with some improving short term memory - and with improving attention to details and divided attention. SLP again targeted divided attention and attention to detail tasks - pt's performance was very good today, with written task and conversastion with SLP along with another auditory tas,. Pt has returned to work and driving and agrees that x1/week is appropriate, likely one-two more sessions, beginning next week. Pt would cont to benefit from likely one-two more skilled ST targeting cognitive linguistic deficits.    Speech Therapy Frequency 2x / week    Duration 8 weeks    Treatment/Interventions Functional tasks;Compensatory techniques;Cueing hierarchy;SLP instruction and feedback;Patient/family education;Internal/external aids    Potential to Achieve Goals Good    Consulted and Agree with Plan of Care Patient             Patient will benefit from skilled therapeutic intervention in order to improve the following deficits and impairments:   Cognitive communication deficit    Problem List Patient Active Problem List   Diagnosis Date Noted   Hypertensive emergency 92/42/6834   Acute metabolic encephalopathy 19/62/2297   ICH (intracerebral hemorrhage) ([facility]) 12/11/2020   UTI due to extended-spectrum beta lactamase (ESBL) producing Escherichia coli 01/19/2020   E coli bacteremia 01/18/2020   Severe sepsis ([facility]) 01/17/2020   AKI (acute kidney injury) ([facility]) 01/17/2020   Thrombocytopenia ([facility]) 01/17/2020   Essential hypertension 01/17/2020   Ulcerative colitis ([facility]) 01/17/2020   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 02/09/2012    Matthew Francis ,[place], CCC-SLP  02/08/2021, 11:46 AM  [facility] [address] [place],  [place], [address] Phone: [phone]   Fax:  [phone]   Name: Matthew Francis MRN: [id number] Date of Birth: 24-Sep-1960

## 2021-02-08 NOTE — Therapy (Signed)
[facility] [address] [place], [place], [address] Phone: [phone]   Fax:  [phone]  Occupational Therapy Treatment  Patient Details  Name: Matthew Francis MRN: [id number] Date of Birth: Nov 07, 1960 Referring Provider (OT): Dr. Naaman Plummer   Encounter Date: 02/08/2021   OT End of Session - 02/08/21 1132     Visit Number 8    Number of Visits 20    Date for OT Re-Evaluation 03/20/21    Authorization Type [employer]    Authorization Time Period 10 weeks to account for scheduling    Authorization - Visit Number 8    Authorization - Number of Visits 15    OT Start Time 1105    OT Stop Time 1143    OT Time Calculation (min) 38 min    Activity Tolerance Patient tolerated treatment well    Behavior During Therapy WFL for tasks assessed/performed             Past Medical History:  Diagnosis Date   Arthritis    GERD (gastroesophageal reflux disease)    H/O ulcerative colitis    Hyperlipidemia    Hypertension    Inguinal hernia    Ulcer    ulcerative colitis   Ulcerative colitis    Wears glasses     Past Surgical History:  Procedure Laterality Date   APPENDECTOMY     COLECTOMY     Proctocolectomy with IPAA   COLECTOMY     FOREIGN BODY REMOVAL ABDOMINAL  02/17/2012   Procedure: REMOVAL FOREIGN BODY ABDOMINAL;  Surgeon: Odis Hollingshead, MD;  Location: [facility];  Service: General;  Laterality: N/A;  abdominal wound exploration and removal of foreign body   HERNIA REPAIR     Multiple incisional hernias.   ileoanal anastomosis      There were no vitals filed for this visit.   Subjective Assessment - 02/08/21 1108     Subjective  "I am tired." "I forgot to take my medicine yesterday"    Patient Stated Goals improve short term memory, improve hand numbness    Currently in Pain? Yes    Pain Score 3     Pain Location Generalized    Pain Descriptors / Indicators --   queaziness   Pain  Type Acute pain    Pain Onset Yesterday    Pain Frequency Intermittent                          OT Treatments/Exercises (OP) - 02/08/21 1111       ADLs   Work pt is working about 4 nights a week right now      Research scientist (physical sciences)   Attention Span Alternating switching between small pegs and PVC pipe tree every 2 minutes with keeping track of time independently.  Alternating Symbols on Constant Therapy level 7 with 95% and 52.83s response time      Visual/Perceptual Exercises   Copy this Image Pegboard;PVC    Pegboard no difficulty - no errors    PVC no difficulty - no errors    Scanning Environmental    Scanning - Environmental finding letters A-O with 100% accuracy and good attention with scanning      Fine Motor Coordination (Hand/Wrist)   Fine Motor Coordination Small Pegboard    Small Pegboard with RUE for coordination during alternating attention task. Pt completed with RUE with little difficulty, no drops. removed with in hand manipulation  OT Short Term Goals - 02/05/21 1020       OT SHORT TERM GOAL #1   Title I with inital HEP    Time 4    Period Weeks    Status Achieved   met for coordination HEP   Target Date 02/06/21      OT SHORT TERM GOAL #2   Title Pt will verbalize understanding of precautions related to sensory deficits for RUE    Time 4    Period Weeks    Status Achieved      OT SHORT TERM GOAL #3   Title Pt will demonstrate ability to cook 2 items simultaneously modified indpendently demonstrating good safety awareness.    Status Achieved      OT SHORT TERM GOAL #4   Title Pt will perfrom mod complex envirronmental scanning task with 90% or better accuracy in prep for work and driving    Status On-going   pt performed with 92 % accuracy on 02/05/21, check one more time for consistency              OT Long Term Goals - 02/05/21 1020       OT LONG TERM GOAL #1   Title Pt will perfrom  simulated work activities modified independently in a reasonable amount of time.    Time 10    Period Weeks    Status Achieved   has returned to work and pt reports no issues     OT LONG TERM GOAL #2   Title Pt will demonstrate improved fine motor coordination for work activities as evidenced by decreasing  RUE 9 hole peg test score by 3 secs and perform without drops    Baseline 26.13    Time 10    Period Weeks    Status On-going   25.87, however no drops     OT LONG TERM GOAL #3   Title Pt will perfrom a physical and cognitve task simultaneously  with 90% or better accuracy in prep for driving    Time 10    Period Weeks    Status On-going                   Plan - 02/08/21 1226     Clinical Impression Statement Pt is progressing towards goals. Pt with better awareness into deficits today however making progress towards attention and visual scanning.    OT Occupational Profile and History Problem Focused Assessment - Including review of records relating to presenting problem    Occupational performance deficits (Please refer to evaluation for details): ADL's;IADL's;Work;Leisure;Social Participation    Body Structure / Function / Physical Skills ADL;UE functional use;Balance;Flexibility;Vision;FMC;ROM;Gait;GMC;Coordination;Decreased knowledge of precautions;Sensation;IADL;Decreased knowledge of use of DME;Dexterity;Strength;Mobility    Cognitive Skills Attention;Problem Solve;Thought    Rehab Potential Good    Clinical Decision Making Limited treatment options, no task modification necessary    Comorbidities Affecting Occupational Performance: May have comorbidities impacting occupational performance    Modification or Assistance to Complete Evaluation  No modification of tasks or assist necessary to complete eval    OT Frequency 2x / week    OT Duration Other (comment)   x10 weeks   OT Treatment/Interventions Self-care/ADL training;Ultrasound;Energy  conservation;Visual/perceptual remediation/compensation;Patient/family education;DME and/or AE instruction;Aquatic Therapy;Paraffin;Passive range of motion;Balance training;Fluidtherapy;Cryotherapy;Electrical Stimulation;Splinting;Functional Mobility Training;Moist Heat;Therapeutic exercise;Manual Therapy;Therapeutic activities;Neuromuscular education;Cognitive remediation/compensation    Plan pt continuing 1/week with ST possibly - hoping to continue with OT as needed at same freq, continue with RUE coordination (had difficulty  with in hand manipulation with RUE)    Consulted and Agree with Plan of Care Patient             Patient will benefit from skilled therapeutic intervention in order to improve the following deficits and impairments:   Body Structure / Function / Physical Skills: ADL, UE functional use, Balance, Flexibility, Vision, FMC, ROM, Gait, GMC, Coordination, Decreased knowledge of precautions, Sensation, IADL, Decreased knowledge of use of DME, Dexterity, Strength, Mobility Cognitive Skills: Attention, Problem Solve, Thought     Visit Diagnosis: Muscle weakness (generalized)  Visuospatial deficit  Other lack of coordination  Other disturbances of skin sensation  Attention and concentration deficit  Frontal lobe and executive function deficit    Problem List Patient Active Problem List   Diagnosis Date Noted   Hypertensive emergency 00/92/3300   Acute metabolic encephalopathy 76/22/6333   ICH (intracerebral hemorrhage) ([facility]) 12/11/2020   UTI due to extended-spectrum beta lactamase (ESBL) producing Escherichia coli 01/19/2020   E coli bacteremia 01/18/2020   Severe sepsis ([facility]) 01/17/2020   AKI (acute kidney injury) ([facility]) 01/17/2020   Thrombocytopenia ([facility]) 01/17/2020   Essential hypertension 01/17/2020   Ulcerative colitis ([facility]) 01/17/2020   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 02/09/2012    Zachery Conch MOT, OTR/L  02/08/2021,  12:28 PM  [facility] [address] [place], [place], [address] Phone: [phone]   Fax:  [phone]  Name: Matthew Francis MRN: [id number] Date of Birth: January 13, 1961

## 2021-02-12 ENCOUNTER — Ambulatory Visit: Payer: 59

## 2021-02-12 ENCOUNTER — Ambulatory Visit: Payer: 59 | Admitting: Occupational Therapy

## 2021-02-12 ENCOUNTER — Encounter: Payer: Self-pay | Admitting: Occupational Therapy

## 2021-02-12 ENCOUNTER — Other Ambulatory Visit: Payer: Self-pay

## 2021-02-12 DIAGNOSIS — R278 Other lack of coordination: Secondary | ICD-10-CM

## 2021-02-12 DIAGNOSIS — R41840 Attention and concentration deficit: Secondary | ICD-10-CM

## 2021-02-12 DIAGNOSIS — R208 Other disturbances of skin sensation: Secondary | ICD-10-CM | POA: Diagnosis not present

## 2021-02-12 DIAGNOSIS — R41841 Cognitive communication deficit: Secondary | ICD-10-CM

## 2021-02-12 DIAGNOSIS — R41842 Visuospatial deficit: Secondary | ICD-10-CM

## 2021-02-12 DIAGNOSIS — M6281 Muscle weakness (generalized): Secondary | ICD-10-CM

## 2021-02-12 DIAGNOSIS — R41844 Frontal lobe and executive function deficit: Secondary | ICD-10-CM

## 2021-02-12 DIAGNOSIS — R2689 Other abnormalities of gait and mobility: Secondary | ICD-10-CM

## 2021-02-12 NOTE — Therapy (Signed)
[facility] [address] [place], [place], [address] Phone: [phone]   Fax:  [phone]  Occupational Therapy Treatment  Patient Details  Name: Matthew Francis MRN: [id number] Date of Birth: 02-Jun-1961 Referring Provider (OT): Dr. Naaman Plummer   Encounter Date: 02/12/2021   OT End of Session - 02/12/21 1024     Visit Number 9    Number of Visits 20    Date for OT Re-Evaluation 03/20/21    Authorization Type [employer]    Authorization Time Period 10 weeks to account for scheduling    Authorization - Visit Number 8    Authorization - Number of Visits 15    OT Start Time 1020    OT Stop Time 1058    OT Time Calculation (min) 38 min    Activity Tolerance Patient tolerated treatment well    Behavior During Therapy WFL for tasks assessed/performed             Past Medical History:  Diagnosis Date   Arthritis    GERD (gastroesophageal reflux disease)    H/O ulcerative colitis    Hyperlipidemia    Hypertension    Inguinal hernia    Ulcer    ulcerative colitis   Ulcerative colitis    Wears glasses     Past Surgical History:  Procedure Laterality Date   APPENDECTOMY     COLECTOMY     Proctocolectomy with IPAA   COLECTOMY     FOREIGN BODY REMOVAL ABDOMINAL  02/17/2012   Procedure: REMOVAL FOREIGN BODY ABDOMINAL;  Surgeon: Odis Hollingshead, MD;  Location: [facility];  Service: General;  Laterality: N/A;  abdominal wound exploration and removal of foreign body   HERNIA REPAIR     Multiple incisional hernias.   ileoanal anastomosis      There were no vitals filed for this visit.   Subjective Assessment - 02/12/21 1023     Subjective  "I'm tired - I had to go to [place]. I drove down to [facility] and got back around 3 in the morning"    Patient Stated Goals improve short term memory, improve hand numbness    Currently in Pain? Yes    Pain Score 3     Pain Location Abdomen    Pain  Orientation Anterior    Pain Type Acute pain                          OT Treatments/Exercises (OP) - 02/12/21 1027       Cognitive Exercises   Attention Span Alternating Finding alternating words (alphabetizing uppercase/lowercase) level 4 on constant therapy with 84% accuracy with 63.2s response time. Pt with increased fatigue today that may be impeding ability to copmlete task with greater accuracy    Attention Span Divided ambulating while manipulation of golf balls in RUE and word finding animals A-Z with 92% accuracy.      Fine Motor Coordination (Hand/Wrist)   Fine Motor Coordination Small Pegboard    Small Pegboard with RUE with holding dice in palm for increasing pincer grasp. Pt with little to no difficulty and no drops for using RUE.                      OT Short Term Goals - 02/12/21 1055       OT SHORT TERM GOAL #1   Title I with inital HEP    Time 4  Period Weeks    Status Achieved   met for coordination HEP   Target Date 02/06/21      OT SHORT TERM GOAL #2   Title Pt will verbalize understanding of precautions related to sensory deficits for RUE    Time 4    Period Weeks    Status Achieved      OT SHORT TERM GOAL #3   Title Pt will demonstrate ability to cook 2 items simultaneously modified indpendently demonstrating good safety awareness.    Status Achieved      OT SHORT TERM GOAL #4   Title Pt will perfrom mod complex envirronmental scanning task with 90% or better accuracy in prep for work and driving    Status Achieved   100% accuracy              OT Long Term Goals - 02/12/21 1056       OT LONG TERM GOAL #1   Title Pt will perfrom simulated work activities modified independently in a reasonable amount of time.    Time 10    Period Weeks    Status Achieved   has returned to work and pt reports no issues     OT LONG TERM GOAL #2   Title Pt will demonstrate improved fine motor coordination for work activities as  evidenced by decreasing  RUE 9 hole peg test score by 3 secs and perform without drops    Baseline 26.13    Time 10    Period Weeks    Status On-going   25.87, however no drops     OT LONG TERM GOAL #3   Title Pt will perfrom a physical and cognitve task simultaneously  with 90% or better accuracy in prep for driving    Time 10    Period Weeks    Status On-going   met 02/12/21 with 92% accuracy - check again for consistency                  Plan - 02/12/21 1147     Clinical Impression Statement Pt continues to progress towards goals. Pt with increased fatigue this day.    OT Occupational Profile and History Problem Focused Assessment - Including review of records relating to presenting problem    Occupational performance deficits (Please refer to evaluation for details): ADL's;IADL's;Work;Leisure;Social Participation    Body Structure / Function / Physical Skills ADL;UE functional use;Balance;Flexibility;Vision;FMC;ROM;Gait;GMC;Coordination;Decreased knowledge of precautions;Sensation;IADL;Decreased knowledge of use of DME;Dexterity;Strength;Mobility    Cognitive Skills Attention;Problem Solve;Thought    Rehab Potential Good    Clinical Decision Making Limited treatment options, no task modification necessary    Comorbidities Affecting Occupational Performance: May have comorbidities impacting occupational performance    Modification or Assistance to Complete Evaluation  No modification of tasks or assist necessary to complete eval    OT Frequency 2x / week    OT Duration Other (comment)   x10 weeks   OT Treatment/Interventions Self-care/ADL training;Ultrasound;Energy conservation;Visual/perceptual remediation/compensation;Patient/family education;DME and/or AE instruction;Aquatic Therapy;Paraffin;Passive range of motion;Balance training;Fluidtherapy;Cryotherapy;Electrical Stimulation;Splinting;Functional Mobility Training;Moist Heat;Therapeutic exercise;Manual Therapy;Therapeutic  activities;Neuromuscular education;Cognitive remediation/compensation    Plan continue to progress towards unmet goals.    Consulted and Agree with Plan of Care Patient             Patient will benefit from skilled therapeutic intervention in order to improve the following deficits and impairments:   Body Structure / Function / Physical Skills: ADL, UE functional use, Balance, Flexibility, Vision, FMC, ROM, Gait, GMC,  Coordination, Decreased knowledge of precautions, Sensation, IADL, Decreased knowledge of use of DME, Dexterity, Strength, Mobility Cognitive Skills: Attention, Problem Solve, Thought     Visit Diagnosis: Other abnormalities of gait and mobility  Muscle weakness (generalized)  Visuospatial deficit  Other lack of coordination  Attention and concentration deficit  Other disturbances of skin sensation  Frontal lobe and executive function deficit    Problem List Patient Active Problem List   Diagnosis Date Noted   Hypertensive emergency 07/61/5183   Acute metabolic encephalopathy 43/73/5789   ICH (intracerebral hemorrhage) ([facility]) 12/11/2020   UTI due to extended-spectrum beta lactamase (ESBL) producing Escherichia coli 01/19/2020   E coli bacteremia 01/18/2020   Severe sepsis ([facility]) 01/17/2020   AKI (acute kidney injury) ([facility]) 01/17/2020   Thrombocytopenia ([facility]) 01/17/2020   Essential hypertension 01/17/2020   Ulcerative colitis ([facility]) 01/17/2020   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 02/09/2012    Zachery Conch MOT, OTR/L  02/12/2021, 12:00 PM  [facility] [address] [place], [place], [address] Phone: [phone]   Fax:  [phone]  Name: Matthew Francis MRN: [id number] Date of Birth: 08-Mar-1961

## 2021-02-12 NOTE — Therapy (Signed)
[facility] [address] [place], [place], [address] Phone: [phone]   Fax:  [phone]  Speech Language Pathology Treatment  Patient Details  Name: Matthew Francis MRN: [id number] Date of Birth: 10-Jan-1961 Referring Provider (SLP): Lauraine Rinne, [place]   Encounter Date: 02/12/2021   End of Session - 02/12/21 2322     Visit Number 11    Number of Visits 17    Date for SLP Re-Evaluation 03/08/21    SLP Start Time 1105    SLP Stop Time  1145    SLP Time Calculation (min) 40 min    Activity Tolerance Patient tolerated treatment well             Past Medical History:  Diagnosis Date   Arthritis    GERD (gastroesophageal reflux disease)    H/O ulcerative colitis    Hyperlipidemia    Hypertension    Inguinal hernia    Ulcer    ulcerative colitis   Ulcerative colitis    Wears glasses     Past Surgical History:  Procedure Laterality Date   APPENDECTOMY     COLECTOMY     Proctocolectomy with IPAA   COLECTOMY     FOREIGN BODY REMOVAL ABDOMINAL  02/17/2012   Procedure: REMOVAL FOREIGN BODY ABDOMINAL;  Surgeon: Odis Hollingshead, MD;  [facility];  Service: General;  Laterality: N/A;  abdominal wound exploration and removal of foreign body   HERNIA REPAIR     Multiple incisional hernias.   ileoanal anastomosis      There were no vitals filed for this visit.   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 Yes    Pain Score 3     Pain Location Abdomen    Pain Orientation Anterior    Pain Descriptors / Indicators Discomfort    Pain Type Acute pain                   ADULT SLP TREATMENT - 02/12/21 1111        General Information   Behavior/Cognition Alert;Cooperative;Pleasant mood      Treatment Provided   Treatment provided Cognitive-Linquistic      Cognitive-Linquistic Treatment   Treatment focused on Cognition    Skilled Treatment "I got 4-5 hours sleep last night. I'm not really here." SLP engaged pt in divided attention tasks again today - min-mod compelx conversation with SLP while performing written tasks - pt with 100% with both tasks. He cont to tell SLP there are no difficulties at home that he is aware of. He agreed that one more session is a good idea prior to d/c.      Assessment / Recommendations / Plan   Plan Continue with current plan of care      Progression Toward Goals   Progression toward goals Progressing toward goals              SLP Education - 02/12/21 2321     Education Details d/c likely next session    Person(s) Educated Patient    Methods Explanation    Comprehension Verbalized understanding  SLP Short Term Goals - 02/01/21 1259       SLP SHORT TERM GOAL #1   Title complete formal cognitive testing    Period --   sessions   Status Achieved   and cont to next week   Target Date 01/18/21      SLP SHORT TERM GOAL #2   Title pt will develop a memory compensation system of choice and use for appointment tracking, and/or medication administration in 3 sessions    Baseline 01-15-21, 01-29-21    Status Achieved    Target Date 02/01/21              SLP Long Term Goals - 02/12/21 2325       SLP LONG TERM GOAL #1   Title Pt will use memory compensations for tracking medication, appointment tracking/scheduling, to-do lists, etc between 7 sessions    Baseline 01-15-21, 01-18-21, 01-25-21, 01-29-21, 02-01-21, 02-05-21    Status Achieved      SLP LONG TERM GOAL #2   Title pt will demonstrate divided attention between a simple linguistic task and another task for 10 minutes, in 3 sessions    Baseline 01-29-21, 02-05-21    Status Achieved       SLP LONG TERM GOAL #3   Title pt will demo attention to details to self correct for 100% success in fucntional cognitive linguistic tasks in 3 sessions    Baseline 02-05-21, 02-12-21    Time 8    Period Weeks    Status On-going              Plan - 02/12/21 2322     Clinical Impression Statement Matthew Francis cont to present today with mostly improved short term memory - and attention to details and divided attention continue to improve. SLP again targeted divided attention and attention to detail tasks - pt's performance was very good today, with written task and conversastion with SLP along with another auditory task. Pt has returned to work and driving and agrees that next session is likely appropriate for d/c.Pt would cont to benefit from one more skilled ST targeting cognitive linguistic deficits.    Speech Therapy Frequency 2x / week    Duration 8 weeks    Treatment/Interventions Functional tasks;Compensatory techniques;Cueing hierarchy;SLP instruction and feedback;Patient/family education;Internal/external aids    Potential to Achieve Goals Good    Consulted and Agree with Plan of Care Patient             Patient will benefit from skilled therapeutic intervention in order to improve the following deficits and impairments:   Cognitive communication deficit    Problem List Patient Active Problem List   Diagnosis Date Noted   Hypertensive emergency 82/95/6213   Acute metabolic encephalopathy 08/65/7846   ICH (intracerebral hemorrhage) ([facility]) 12/11/2020   UTI due to extended-spectrum beta lactamase (ESBL) producing Escherichia coli 01/19/2020   E coli bacteremia 01/18/2020   Severe sepsis ([facility]) 01/17/2020   AKI (acute kidney injury) ([facility]) 01/17/2020   Thrombocytopenia ([facility]) 01/17/2020   Essential hypertension 01/17/2020   Ulcerative colitis ([facility]) 01/17/2020   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 02/09/2012    [facility] ,[place], CCC-SLP  02/12/2021, 11:27  PM  [facility] [address] [place], [place], [address] Phone: [phone]   Fax:  [phone]   Name: Matthew Francis MRN: [id number] Date of Birth: Dec 06, 1960

## 2021-02-13 ENCOUNTER — Inpatient Hospital Stay: Payer: 59 | Admitting: Adult Health

## 2021-02-13 ENCOUNTER — Ambulatory Visit: Payer: 59

## 2021-02-13 NOTE — Progress Notes (Deleted)
[facility] [address] [place]. [place] [address] [phone]       HOSPITAL FOLLOW UP NOTE  [name] Date of Birth:  09-10-1960 Medical Record Number:  [id number]   Reason for Referral:  hospital stroke follow up    SUBJECTIVE:   CHIEF COMPLAINT:  No chief complaint on file.   HPI:   [name] is a 60 y.o. male with history of ulcerative colitis, HTN, HLD, DM, CKD, lone A. fib admitted on 12/11/2020 for headache, confusion, nausea vomiting.  Personally reviewed hospitalization from progress notes, lab work and imaging.  Evaluated by Dr. Erlinda Hong for right carotid head ICH with IVH likely secondary to hypertensive emergency, cocaine vasculopathy small vessel disease.  CT head showed IPH right lateral ventricle, third ventricle and partial fourth ventricle.  Overnight 7/1, increased confusion with stat CT stable hemorrhage and mild hydrocephalus.  Neurosurgery eval no intervention required with repeat imaging 7/2 improved hydrocephalus.  EEG unremarkable.  MRA unremarkable without aneurysm or AVM identified.  Carotid Dopplers unremarkable.  EF 60 to 65%.  LDL 166 -increase home dose Crestor from 10 mg to 20 mg daily and ongoing use of Zetia.  A1c 5.9.  UDS positive for cocaine.  No prior stroke history.  Hospital course complicated by ulcerative colitis, AKI, c/o severe lower back spasms with radiculopathy and bouts of delirium with agitation and combativeness placed on Risperdal.  Discharge to CIR on 7/8 per therapy recommendations and discharged home on 01/01/2021.        PERTINENT IMAGING      ROS:   14 system review of systems performed and negative with exception of ***  PMH:  Past Medical History:  Diagnosis Date   Arthritis    GERD (gastroesophageal reflux disease)    H/O ulcerative colitis    Hyperlipidemia    Hypertension    Inguinal hernia    Ulcer    ulcerative colitis   Ulcerative colitis    Wears glasses     PSH:   Past Surgical History:  Procedure Laterality Date   APPENDECTOMY     COLECTOMY     Proctocolectomy with IPAA   COLECTOMY     FOREIGN BODY REMOVAL ABDOMINAL  02/17/2012   Procedure: REMOVAL FOREIGN BODY ABDOMINAL;  Surgeon: Odis Hollingshead, MD;  Location: [facility];  Service: General;  Laterality: N/A;  abdominal wound exploration and removal of foreign body   HERNIA REPAIR     Multiple incisional hernias.   ileoanal anastomosis      Social History:  Social History   Socioeconomic History   Marital status: Single    Spouse name: Not on file   Number of children: Not on file   Years of education: Not on file   Highest education level: Not on file  Occupational History   Not on file  Tobacco Use   Smoking status: Never   Smokeless tobacco: Never  Vaping Use   Vaping Use: Never used  Substance and Sexual Activity   Alcohol use: Yes    Alcohol/week: 4.0 standard drinks    Types: 4 Cans of beer per week    Comment: 4 cans of beer per week   Drug use: No   Sexual activity: Not Currently  Other Topics Concern   Not on file  Social History Narrative   Not on file   Social Determinants of Health   Financial Resource Strain: Not on file  Food Insecurity: Not on  file  Transportation Needs: Not on file  Physical Activity: Not on file  Stress: Not on file  Social Connections: Not on file  Intimate Partner Violence: Not on file    Family History:  Family History  Problem Relation Age of Onset   Cancer Sister     Medications:   Current Outpatient Medications on File Prior to Visit  Medication Sig Dispense Refill   acetaminophen (TYLENOL) 325 MG tablet Take 2 tablets (650 mg total) by mouth every 6 (six) hours as needed for moderate pain or mild pain.     amLODipine (NORVASC) 10 MG tablet Take 1 tablet (10 mg total) by mouth daily. 30 tablet 2   ascorbic acid (VITAMIN C) 500 MG tablet Take 1 tablet (500 mg total) by mouth daily. 30 tablet 0    budesonide (ENTOCORT EC) 3 MG 24 hr capsule Take 3 capsules (9 mg total) by mouth daily. 90 capsule 4   cetirizine (ZYRTEC) 10 MG tablet Take 10 mg by mouth daily as needed for allergies.      Cholecalciferol (VITAMIN D3) 1.25 MG (50000 UT) CAPS Take 50,000 Units by mouth every Wednesday. 3 capsule 0   cloNIDine (CATAPRES) 0.1 MG tablet Take 1 tablet (0.1 mg total) by mouth daily. 60 tablet 11   esomeprazole (NEXIUM) 20 MG capsule Take 1 capsule (20 mg total) by mouth daily. 30 capsule 4   hydrALAZINE (APRESOLINE) 100 MG tablet Take 1 tablet (100 mg total) by mouth 2 (two) times daily. 60 tablet 4   loperamide (IMODIUM) 2 MG capsule Take 2 capsules (4 mg total) by mouth as needed for diarrhea or loose stools. 30 capsule 0   melatonin 3 MG TABS tablet Take 1 tablet (3 mg total) by mouth at bedtime. 30 tablet 0   methocarbamol (ROBAXIN) 500 MG tablet Take 1 tablet (500 mg total) by mouth every 8 (eight) hours as needed for muscle spasms. 60 tablet 4   Multiple Vitamin (MULTIVITAMIN WITH MINERALS) TABS Take 1 tablet by mouth daily.     psyllium (METAMUCIL) 58.6 % packet Take 1 packet by mouth daily as needed (For fiber/thickener).      QUEtiapine (SEROQUEL) 25 MG tablet Take 1 tablet (25 mg total) by mouth at bedtime as needed (agitation, sleep). (Patient not taking: Reported on 01/30/2021) 5 tablet 0   rifaximin (XIFAXAN) 550 MG TABS tablet Take 1 tablet (550 mg total) by mouth 2 (two) times daily. 42 tablet 4   rosuvastatin (CRESTOR) 10 MG tablet Take 1 tablet (10 mg total) by mouth every other day. 30 tablet 4   Turmeric (QC TUMERIC COMPLEX PO) Take 1 tablet by mouth daily as needed (For inflammatory).     vitamin B-12 (CYANOCOBALAMIN) 100 MCG tablet Take 1 tablet (100 mcg total) by mouth daily. 30 tablet 0   No current facility-administered medications on file prior to visit.    Allergies:   Allergies  Allergen Reactions   Asacol [Mesalamine] Swelling    Throat    Penicillins Anaphylaxis     Has patient had a PCN reaction causing immediate rash, facial/tongue/throat swelling, SOB or lightheadedness with hypotension: Yes Has patient had a PCN reaction causing severe rash involving mucus membranes or skin necrosis: No Has patient had a PCN reaction that required hospitalization: Yes Has patient had a PCN reaction occurring within the last 10 years: No If all of the above answers are "NO", then may proceed with Cephalosporin use.   Sulfa Antibiotics Anaphylaxis   Atorvastatin Other (  See Comments)    Myalgia, muscle cramps   Bee Pollen Hives   Bee Venom Hives and Swelling   Iodinated Diagnostic Agents     Pt reports allergy at [place]. Rapid heartbeat and dyspnea      OBJECTIVE:  Physical Exam  There were no vitals filed for this visit. There is no height or weight on file to calculate BMI. No results found.  Depression screen PHQ 2/9 01/30/2021  Decreased Interest 1  Down, Depressed, Hopeless 1  PHQ - 2 Score 2  Altered sleeping -  Tired, decreased energy -  Change in appetite -  Feeling bad or failure about yourself  -  Trouble concentrating -  Moving slowly or fidgety/restless -  Suicidal thoughts -  PHQ-9 Score -     General: well developed, well nourished, seated, in no evident distress Head: head normocephalic and atraumatic.   Neck: supple with no carotid or supraclavicular bruits Cardiovascular: regular rate and rhythm, no murmurs Musculoskeletal: no deformity Skin:  no rash/petichiae Vascular:  Normal pulses all extremities   Neurologic Exam Mental Status: Awake and fully alert. Oriented to place and time. Recent and remote memory intact. Attention span, concentration and fund of knowledge appropriate. Mood and affect appropriate.  Cranial Nerves: Fundoscopic exam reveals sharp disc margins. Pupils equal, briskly reactive to light. Extraocular movements full without nystagmus. Visual fields full to confrontation. Hearing intact. Facial sensation  intact. Face, tongue, palate moves normally and symmetrically.  Motor: Normal bulk and tone. Normal strength in all tested extremity muscles Sensory.: intact to touch , pinprick , position and vibratory sensation.  Coordination: Rapid alternating movements normal in all extremities. Finger-to-nose and heel-to-shin performed accurately bilaterally. Gait and Station: Arises from chair without difficulty. Stance is normal. Gait demonstrates normal stride length and balance with ***. Tandem walk and heel toe ***.  Reflexes: 1+ and symmetric. Toes downgoing.     NIHSS  *** Modified Rankin  ***      ASSESSMENT: Matthew Francis is a 60 y.o. 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 Vascular risk factors include HTN, HLD, DM, CKD, cocaine abuse and obesity.      PLAN:  Right ICH with IVH: Residual deficit: ***. Continue {anticoagulants:31417}  and ***  for secondary stroke prevention.  Discussed secondary stroke prevention measures and importance of close PCP follow up for aggressive stroke risk factor management. I have gone over the pathophysiology of stroke, warning signs and symptoms, risk factors and their management in some detail with instructions to go to the closest emergency room for symptoms of concern. HTN: BP goal <130/90.  Stable on *** per PCP HLD: LDL goal <70. Recent LDL 166.  Continue Crestor 20 mg daily and Zetia DMII: A1c goal<7.0. Recent A1c 5.9.  Cocaine abuse:    Follow up in *** or call earlier if needed   CC:  [facility] provider: Dr. Leonie Man PCP: Charline Bills, MD    I spent *** minutes of face-to-face and non-face-to-face time with patient.  This included previsit chart review including review of recent hospitalization, lab review, study review, order entry, electronic health record documentation, patient  education regarding recent stroke including etiology, secondary stroke prevention measures and importance of managing stroke risk factors, residual deficits and typical recovery time and answered all other questions to patient satisfaction   Frann Rider, AGNP-BC  [facility]  [address] [place], [place] [address]  Phone [phone] Fax [phone] Note: This document was prepared with digital dictation and possible smart phrase technology. Any transcriptional errors that result from this process are unintentional.

## 2021-02-14 ENCOUNTER — Ambulatory Visit: Payer: 59 | Admitting: Adult Health

## 2021-02-14 ENCOUNTER — Encounter: Payer: Self-pay | Admitting: Adult Health

## 2021-02-14 VITALS — BP 128/79 | HR 82 | Ht 70.0 in | Wt 220.0 lb

## 2021-02-14 DIAGNOSIS — E119 Type 2 diabetes mellitus without complications: Secondary | ICD-10-CM | POA: Diagnosis not present

## 2021-02-14 DIAGNOSIS — E785 Hyperlipidemia, unspecified: Secondary | ICD-10-CM

## 2021-02-14 DIAGNOSIS — I610 Nontraumatic intracerebral hemorrhage in hemisphere, subcortical: Secondary | ICD-10-CM

## 2021-02-14 DIAGNOSIS — I10 Essential (primary) hypertension: Secondary | ICD-10-CM

## 2021-02-14 DIAGNOSIS — Z794 Long term (current) use of insulin: Secondary | ICD-10-CM

## 2021-02-14 MED ORDER — ROSUVASTATIN CALCIUM 10 MG PO TABS: 10.0000 mg | ORAL_TABLET | Freq: Every day | ORAL | 4 refills | Status: DC

## 2021-02-14 NOTE — Progress Notes (Signed)
[facility] [address] [place]. [place] [address] [phone]       HOSPITAL FOLLOW UP NOTE  Matthew Francis Date of Birth:  04-Jul-1960 Medical Record Number:  [id number]   Reason for Referral:  hospital stroke follow up    SUBJECTIVE:   CHIEF COMPLAINT:  Chief Complaint  Patient presents with   Follow-up    Rm 3 alone Pt is well and stable, little bit of numbness in finger tips.      HPI:   Matthew Francis is a 60 y.o. male with history of ulcerative colitis, HTN, HLD, DM, CKD, lone A. fib admitted on 12/11/2020 for headache, confusion, nausea vomiting.  Personally reviewed hospitalization from progress notes, lab work and imaging.  Evaluated by Dr. Erlinda Hong for right carotid head ICH with IVH likely secondary to hypertensive emergency, cocaine vasculopathy small vessel disease.  CT head showed IPH right lateral ventricle, third ventricle and partial fourth ventricle.  Overnight 7/1, increased confusion with stat CT stable hemorrhage and mild hydrocephalus.  Neurosurgery eval no intervention required with repeat imaging 7/2 improved hydrocephalus.  EEG unremarkable.  MRA unremarkable without aneurysm or AVM identified.  Carotid Dopplers unremarkable.  EF 60 to 65%.  LDL 166 -increase home dose Crestor from 10 mg to 20 mg daily and ongoing use of Zetia.  A1c 5.9.  UDS positive for cocaine.  No prior stroke history.  Hospital course complicated by ulcerative colitis, AKI, c/o severe lower back spasms with radiculopathy and bouts of delirium with agitation and combativeness placed on Risperdal.  Discharge to CIR on 7/8 per therapy recommendations and discharged home on 01/01/2021.  Today, 02/14/2021, Matthew Francis returns for hospital follow up unaccompanied. Overall doing well. Continues to work with therapies with ongoing improvement for residual mild short term memory difficulties, left hand 4-5th digit fingertip numbness and slight decrease left hand dexterity.  Reports  ongoing great improvement.  He has since returned back to work at [employer] at [employer] without difficulty.  Denies new stroke/TIA symptoms.  Remains on Crestor 10 mg every other day (dose not adjusted at d/c) as well as Zetia.  Blood pressure today 128/79.  Stable at home.  Routine monitoring glucose levels which have been stable.  Reports modifying diet and overall making better health choices.  Denies any additional drug use.  No further concerns at this time.       PERTINENT IMAGING  CT HEAD   12/11/2020 IMPRESSION: 1. Acute hemorrhage within the right caudate/basal ganglia with hemorrhage in the right lateral, third, and fourth ventricles. No midline shift. The ventricles are slightly dilated as compared with prior CT from 2021. 2. Mild chronic small vessel ischemic change of the white matter.  12/13/2020 IMPRESSION: Stable right basal ganglia hemorrhage with intraventricular extension. No change since prior study.  12/14/2020 IMPRESSION: Persistent hemorrhage in the head of the caudate nucleus on the right with evidence of interventricular thrombus in the lateral ventricles bilaterally and third ventricle. There is interval dilatation of the lateral and third ventricles with findings of thrombus and apparent occlusion of the cerebral aqueduct related to change in the morphology of the third ventricular thrombus. Additionally signs of increased intracranial pressure are noted with decreased sulcal markings when compared with the previous day.  12/15/2020 IMPRESSION: 1. Improved hydrocephalus. 2. Persistent intraventricular extension of hemorrhage from the right caudate head hematoma.  12/17/2020 IMPRESSION: 1. Unchanged intraparenchymal hemorrhage in the right caudate head extending into the ventricular system. 2. Unchanged mild  communicating hydrocephalus.    MRI BRAIN 12/12/2020 MRA HEAD IMPRESSION: 1. Unchanged intraparenchymal hematoma of the right caudate  head with intraventricular extension and mild communicating hydrocephalus. 2. Normal intracranial MRA.         ROS:   14 system review of systems performed and negative with exception of those listed in HPI  PMH:  Past Medical History:  Diagnosis Date   Arthritis    GERD (gastroesophageal reflux disease)    H/O ulcerative colitis    Hyperlipidemia    Hypertension    Inguinal hernia    Ulcer    ulcerative colitis   Ulcerative colitis    Wears glasses     PSH:  Past Surgical History:  Procedure Laterality Date   APPENDECTOMY     COLECTOMY     Proctocolectomy with IPAA   COLECTOMY     FOREIGN BODY REMOVAL ABDOMINAL  02/17/2012   Procedure: REMOVAL FOREIGN BODY ABDOMINAL;  Surgeon: Odis Hollingshead, MD;  Location: [facility];  Service: General;  Laterality: N/A;  abdominal wound exploration and removal of foreign body   HERNIA REPAIR     Multiple incisional hernias.   ileoanal anastomosis      Social History:  Social History   Socioeconomic History   Marital status: Single    Spouse name: Not on file   Number of children: Not on file   Years of education: Not on file   Highest education level: Not on file  Occupational History   Not on file  Tobacco Use   Smoking status: Never   Smokeless tobacco: Never  Vaping Use   Vaping Use: Never used  Substance and Sexual Activity   Alcohol use: Yes    Alcohol/week: 4.0 standard drinks    Types: 4 Cans of beer per week    Comment: 4 cans of beer per week   Drug use: No   Sexual activity: Not Currently  Other Topics Concern   Not on file  Social History Narrative   Not on file   Social Determinants of Health   Financial Resource Strain: Not on file  Food Insecurity: Not on file  Transportation Needs: Not on file  Physical Activity: Not on file  Stress: Not on file  Social Connections: Not on file  Intimate Partner Violence: Not on file    Family History:  Family History  Problem  Relation Age of Onset   Cancer Sister     Medications:   Current Outpatient Medications on File Prior to Visit  Medication Sig Dispense Refill   acetaminophen (TYLENOL) 325 MG tablet Take 2 tablets (650 mg total) by mouth every 6 (six) hours as needed for moderate pain or mild pain.     amLODipine (NORVASC) 10 MG tablet Take 1 tablet (10 mg total) by mouth daily. 30 tablet 2   ascorbic acid (VITAMIN C) 500 MG tablet Take 1 tablet (500 mg total) by mouth daily. 30 tablet 0   budesonide (ENTOCORT EC) 3 MG 24 hr capsule Take 3 capsules (9 mg total) by mouth daily. 90 capsule 4   cetirizine (ZYRTEC) 10 MG tablet Take 10 mg by mouth daily as needed for allergies.      Cholecalciferol (VITAMIN D3) 1.25 MG (50000 UT) CAPS Take 50,000 Units by mouth every Wednesday. 3 capsule 0   cloNIDine (CATAPRES) 0.1 MG tablet Take 1 tablet (0.1 mg total) by mouth daily. 60 tablet 11   esomeprazole (NEXIUM) 20 MG capsule Take 1 capsule (  20 mg total) by mouth daily. 30 capsule 4   hydrALAZINE (APRESOLINE) 100 MG tablet Take 1 tablet (100 mg total) by mouth 2 (two) times daily. 60 tablet 4   loperamide (IMODIUM) 2 MG capsule Take 2 capsules (4 mg total) by mouth as needed for diarrhea or loose stools. 30 capsule 0   melatonin 3 MG TABS tablet Take 1 tablet (3 mg total) by mouth at bedtime. 30 tablet 0   methocarbamol (ROBAXIN) 500 MG tablet Take 1 tablet (500 mg total) by mouth every 8 (eight) hours as needed for muscle spasms. 60 tablet 4   Multiple Vitamin (MULTIVITAMIN WITH MINERALS) TABS Take 1 tablet by mouth daily.     psyllium (METAMUCIL) 58.6 % packet Take 1 packet by mouth daily as needed (For fiber/thickener).      QUEtiapine (SEROQUEL) 25 MG tablet Take 1 tablet (25 mg total) by mouth at bedtime as needed (agitation, sleep). 5 tablet 0   rifaximin (XIFAXAN) 550 MG TABS tablet Take 1 tablet (550 mg total) by mouth 2 (two) times daily. 42 tablet 4   rosuvastatin (CRESTOR) 10 MG tablet Take 1 tablet (10 mg  total) by mouth every other day. 30 tablet 4   Turmeric (QC TUMERIC COMPLEX PO) Take 1 tablet by mouth daily as needed (For inflammatory).     vitamin B-12 (CYANOCOBALAMIN) 100 MCG tablet Take 1 tablet (100 mcg total) by mouth daily. 30 tablet 0   No current facility-administered medications on file prior to visit.    Allergies:   Allergies  Allergen Reactions   Asacol [Mesalamine] Swelling    Throat    Penicillins Anaphylaxis    Has patient had a PCN reaction causing immediate rash, facial/tongue/throat swelling, SOB or lightheadedness with hypotension: Yes Has patient had a PCN reaction causing severe rash involving mucus membranes or skin necrosis: No Has patient had a PCN reaction that required hospitalization: Yes Has patient had a PCN reaction occurring within the last 10 years: No If all of the above answers are "NO", then may proceed with Cephalosporin use.   Sulfa Antibiotics Anaphylaxis   Atorvastatin Other (See Comments)    Myalgia, muscle cramps   Bee Pollen Hives   Bee Venom Hives and Swelling   Iodinated Diagnostic Agents     Pt reports allergy at [place]. Rapid heartbeat and dyspnea      OBJECTIVE:  Physical Exam  Vitals:   02/14/21 1241  BP: 128/79  Pulse: 82  Weight: 220 lb (99.8 kg)  Height: 5' 10"  (1.778 m)   Body mass index is 31.57 kg/m. No results found.  Post stroke PHQ 2/9 Depression screen PHQ 2/9 01/30/2021  Decreased Interest 1  Down, Depressed, Hopeless 1  PHQ - 2 Score 2  Altered sleeping -  Tired, decreased energy -  Change in appetite -  Feeling bad or failure about yourself  -  Trouble concentrating -  Moving slowly or fidgety/restless -  Suicidal thoughts -  PHQ-9 Score -     General: well developed, well nourished, very pleasant middle-aged African-American male, seated, in no evident distress Head: head normocephalic and atraumatic.   Neck: supple with no carotid or supraclavicular bruits Cardiovascular: regular rate  and rhythm, no murmurs Musculoskeletal: no deformity Skin:  no rash/petichiae Vascular:  Normal pulses all extremities   Neurologic Exam Mental Status: Awake and fully alert.  Fluent speech and language.  Oriented to place and time. Recent and remote memory intact. Attention span, concentration and fund of  knowledge appropriate. Mood and affect appropriate.  Cranial Nerves: Fundoscopic exam reveals sharp disc margins. Pupils equal, briskly reactive to light. Extraocular movements full without nystagmus. Visual fields full to confrontation. Hearing intact. Facial sensation intact. Face, tongue, palate moves normally and symmetrically.  Motor: Normal bulk and tone. Normal strength in all tested extremity muscles except slight decrease left hand dexterity and slight L ADF weakness.  Orbits right arm over left arm. Sensory.: intact to touch , pinprick , position and vibratory sensation.  Coordination: Rapid alternating movements normal in all extremities except slightly decreased left hand. Finger-to-nose and heel-to-shin performed accurately bilaterally. Gait and Station: Arises from chair without difficulty. Stance is normal. Gait demonstrates normal stride length and balance with out use of assistive device. Tandem walk and heel toe mild difficulty.  Reflexes: 1+ and symmetric. Toes downgoing.     NIHSS  0 Modified Rankin  2      ASSESSMENT: Matthew Francis is a 60 y.o. 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 Vascular risk factors include HTN, HLD, DM, CKD, cocaine abuse and obesity.      PLAN:  Right ICH with IVH:  Residual deficit: Recovered well with minimal residual deficits.  Continue working with therapies for likely ongoing recovery.   Advised to increase Crestor to 10 mg daily for secondary stroke prevention but may  need to be adjusted further based on repeat lipid panel today.   Discussed secondary stroke prevention measures and importance of close PCP follow up for aggressive stroke risk factor management. I have gone over the pathophysiology of stroke, warning signs and symptoms, risk factors and their management in some detail with instructions to go to the closest emergency room for symptoms of concern. HTN: BP goal <130/90.  Stable on current regimen per PCP HLD: LDL goal <70. Recent LDL 166 on Crestor 10 mg QOD and zetia.  Repeat lipid panel today.  Advised to increase Crestor to 10 mg daily but may need to be further modified depending on results DMII: A1c goal<7.0. Recent A1c 5.9. managed by PCP Cocaine abuse: complete cessation since d/c. Highly encouraged.     Follow up in 4 months or call earlier if needed   CC:  [place] provider: Dr. Leonie Man PCP: Charline Bills, MD    I spent 54 minutes of face-to-face and non-face-to-face time with patient.  This included previsit chart review including review of recent hospitalization, lab review, study review, order entry, electronic health record documentation, patient education regarding recent stroke including etiology, secondary stroke prevention measures and importance of managing stroke risk factors, residual deficits and typical recovery time and answered all other questions to patient satisfaction   Frann Rider, AGNP-BC  [facility] [address] [place], [place] [address]  Phone [phone] Fax [phone] Note: This document was prepared with digital dictation and possible smart phrase technology. Any transcriptional errors that result from this process are unintentional.

## 2021-02-14 NOTE — Patient Instructions (Signed)
Increase crestor to 34m DAILY  for secondary stroke prevention  Check cholesterol levels today  Continue to follow up with PCP regarding cholesterol,blood pressure and diabetes management  Maintain strict control of hypertension with blood pressure goal below 130/90, diabetes with hemoglobin A1c goal below 7% and cholesterol with LDL cholesterol (bad cholesterol) goal below 70 mg/dL.       Followup in the future with me in 4 months or call earlier if needed       Thank you for coming to [facility]. I hope we have been able to provide you high quality care today.  You may receive a patient satisfaction survey over the next few weeks. We would appreciate your feedback and comments so that we may continue to improve ourselves and the health of our patients.

## 2021-02-15 ENCOUNTER — Ambulatory Visit: Payer: 59

## 2021-02-15 LAB — LIPID PANEL
Chol/HDL Ratio: 3 ratio (ref 0.0–5.0)
Cholesterol, Total: 192 mg/dL (ref 100–199)
HDL: 64 mg/dL (ref >39)
LDL Chol Calc (NIH): 110 mg/dL — ABNORMAL HIGH (ref 0–99)
Triglycerides: 99 mg/dL (ref 0–149)
VLDL Cholesterol Cal: 18 mg/dL (ref 5–40)

## 2021-02-19 ENCOUNTER — Other Ambulatory Visit: Payer: Self-pay

## 2021-02-19 ENCOUNTER — Ambulatory Visit: Payer: 59 | Attending: Physician Assistant

## 2021-02-19 ENCOUNTER — Ambulatory Visit: Payer: 59 | Admitting: Occupational Therapy

## 2021-02-19 ENCOUNTER — Ambulatory Visit: Payer: 59

## 2021-02-19 ENCOUNTER — Encounter: Payer: Self-pay | Admitting: Occupational Therapy

## 2021-02-19 DIAGNOSIS — R41840 Attention and concentration deficit: Secondary | ICD-10-CM | POA: Insufficient documentation

## 2021-02-19 DIAGNOSIS — R278 Other lack of coordination: Secondary | ICD-10-CM | POA: Diagnosis present

## 2021-02-19 DIAGNOSIS — M6281 Muscle weakness (generalized): Secondary | ICD-10-CM

## 2021-02-19 DIAGNOSIS — R41844 Frontal lobe and executive function deficit: Secondary | ICD-10-CM | POA: Insufficient documentation

## 2021-02-19 DIAGNOSIS — R208 Other disturbances of skin sensation: Secondary | ICD-10-CM | POA: Diagnosis present

## 2021-02-19 DIAGNOSIS — R2689 Other abnormalities of gait and mobility: Secondary | ICD-10-CM | POA: Insufficient documentation

## 2021-02-19 DIAGNOSIS — R41841 Cognitive communication deficit: Secondary | ICD-10-CM | POA: Diagnosis present

## 2021-02-19 DIAGNOSIS — R41842 Visuospatial deficit: Secondary | ICD-10-CM

## 2021-02-19 NOTE — Patient Instructions (Signed)
Access Code: [email or web address] URL: [email or web address] Date: 02/19/2021 Prepared by: Baldomero Lamy  Exercises Single Leg Sit to Stand with Arms Extended - 1 x daily - 5 x weekly - 2 sets - 10 reps Upright Side Lunge - 1 x daily - 5 x weekly - 1 sets - 10 reps Backwards Walking - 1 x daily - 5 x weekly - 1 sets - 3 reps Sideways Walking - 1 x daily - 5 x weekly - 1 sets - 3 reps Seated Piriformis Stretch - 1 x daily - 7 x weekly - 30 sec hold Seated Figure 4 Piriformis Stretch - 1 x daily - 7 x weekly - 30 sec hold Seated Hamstring Stretch - 1 x daily - 7 x weekly - 30 sec hold

## 2021-02-19 NOTE — Patient Instructions (Signed)
  If any worse difficulties arise with cognition follow up with Dr. Naaman Plummer.

## 2021-02-19 NOTE — Addendum Note (Signed)
Addended by: Baldomero Lamy B on: 02/19/2021 06:53 PM   Modules accepted: Orders

## 2021-02-19 NOTE — Therapy (Signed)
[facility] [address] [place], [place], [address] Phone: [phone]   Fax:  [phone]  Speech Language Pathology Treatment/ Discharge Summary  Patient Details  Name: Matthew Francis MRN: [id number] Date of Birth: 03-Dec-1960 Referring Provider (SLP): Lauraine Rinne, [place]   Encounter Date: 02/19/2021   End of Session - 02/19/21 1135     Visit Number 12    Number of Visits 17    Date for SLP Re-Evaluation 03/08/21    SLP Start Time 29    SLP Stop Time  1100    SLP Time Calculation (min) 42 min    Activity Tolerance Patient tolerated treatment well             Past Medical History:  Diagnosis Date   Arthritis    GERD (gastroesophageal reflux disease)    H/O ulcerative colitis    Hyperlipidemia    Hypertension    Inguinal hernia    Ulcer    ulcerative colitis   Ulcerative colitis    Wears glasses     Past Surgical History:  Procedure Laterality Date   APPENDECTOMY     COLECTOMY     Proctocolectomy with IPAA   COLECTOMY     FOREIGN BODY REMOVAL ABDOMINAL  02/17/2012   Procedure: REMOVAL FOREIGN BODY ABDOMINAL;  Surgeon: Odis Hollingshead, MD;  Location: [facility];  Service: General;  Laterality: N/A;  abdominal wound exploration and removal of foreign body   HERNIA REPAIR     Multiple incisional hernias.   ileoanal anastomosis      There were no vitals filed for this visit.  SPEECH THERAPY DISCHARGE SUMMARY  Visits from Start of Care: 12  Current functional level related to goals / functional outcomes: See goals below. Pt reports he thinks his cognition is approx 90-95% of baseline.   Remaining deficits: None seen in ST visits.    Education / Equipment: Compensations for cognition.   Patient agrees to discharge. Patient goals were met. Patient is being discharged due to meeting the stated rehab goals..      Subjective Assessment - 02/19/21 1022     Subjective Pt  does not report any surprises, disappointments with cognition since last visit.    Currently in Pain? Yes    Pain Score 3     Pain Location Abdomen   stomach   Pain Orientation --   generalized in abdomen   Pain Type Acute pain    Pain Onset 1 to 4 weeks ago    Pain Frequency Constant                   ADULT SLP TREATMENT - 02/19/21 1024       General Information   Behavior/Cognition Alert;Cooperative;Pleasant mood      Treatment Provided   Treatment provided Cognitive-Linquistic      Cognitive-Linquistic Treatment   Treatment focused on Cognition    Skilled Treatment Pt endorses that his cognition is 90-95% back to baseline. 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 SLP worked with pt with divided attention today with written and auditory tasks. Pt had 100% success with both tasks. He indiated he was comfortable with d/c today.      Assessment / Recommendations / Plan   Plan Discharge SLP treatment due to (comment)   pt     Progression Toward Goals   Progression toward goals --   d/c day - see  goals               SLP Short Term Goals - 02/01/21 1259       SLP SHORT TERM GOAL #1   Title complete formal cognitive testing    Period --   sessions   Status Achieved   and cont to next week   Target Date 01/18/21      SLP SHORT TERM GOAL #2   Title pt will develop a memory compensation system of choice and use for appointment tracking, and/or medication administration in 3 sessions    Baseline 01-15-21, 01-29-21    Status Achieved    Target Date 02/01/21              SLP Long Term Goals - 02/19/21 1058       SLP LONG TERM GOAL #1   Title Pt will use memory compensations for tracking medication, appointment tracking/scheduling, to-do lists, etc between 7 sessions    Baseline 01-15-21, 01-18-21, 01-25-21, 01-29-21, 02-01-21, 02-05-21    Status Achieved      SLP LONG TERM GOAL #2   Title pt will demonstrate divided attention  between a simple linguistic task and another task for 10 minutes, in 3 sessions    Baseline 01-29-21, 02-05-21    Status Achieved      SLP LONG TERM GOAL #3   Title pt will demo attention to details to self correct for 100% success in fucntional cognitive linguistic tasks in 3 sessions    Baseline 02-05-21, 02-12-21    Status Achieved              Plan - 02/19/21 [place] tells SLP today he feels/thinks his cognition is approx 90-95% of baseline. SLP again targeted divided attention and attention to detail tasks - pt's performance 100% with both tasks today, with written task and conversastion with SLP along with another auditory task. Pt has returned to work and driving and agrees that next session is likely appropriate for d/c.Pt would cont to benefit from one more skilled ST targeting cognitive linguistic deficits.    Treatment/Interventions Functional tasks;Compensatory techniques;Cueing hierarchy;SLP instruction and feedback;Patient/family education;Internal/external aids    Potential to Achieve Goals Good    Consulted and Agree with Plan of Care Patient             Patient will benefit from skilled therapeutic intervention in order to improve the following deficits and impairments:   Cognitive communication deficit    Problem List Patient Active Problem List   Diagnosis Date Noted   Hypertensive emergency 47/20/7218   Acute metabolic encephalopathy 28/83/3744   ICH (intracerebral hemorrhage) ([place]) 12/11/2020   UTI due to extended-spectrum beta lactamase (ESBL) producing Escherichia coli 01/19/2020   E coli bacteremia 01/18/2020   Severe sepsis ([place]) 01/17/2020   AKI (acute kidney injury) ([place]) 01/17/2020   Thrombocytopenia ([place]) 01/17/2020   Essential hypertension 01/17/2020   Ulcerative colitis ([place]) 01/17/2020   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 02/09/2012    [place] ,[place], [place]  02/19/2021, 12:14  PM  [facility] [address] [place], [place], [address] Phone: [phone]   Fax:  [phone]   Name: Matthew Francis MRN: [id number] Date of Birth: August 26, 1960

## 2021-02-19 NOTE — Therapy (Signed)
[facility] [address] [place], [place], [address] Phone: [phone]   Fax:  [phone]  Physical Therapy Treatment/Discharge Summary  Patient Details  Name: Matthew Francis MRN: [id number] Date of Birth: 07/17/1960 Referring Provider (PT): Angiulli, Lavon Paganini, PA-C  PHYSICAL THERAPY DISCHARGE SUMMARY  Visits from Start of Care: 6  Current functional level related to goals / functional outcomes: See Clinical Impression Statement   Remaining deficits: None   Education / Equipment: HEP Provided  Patient agrees to discharge. Patient goals were met. Patient is being discharged due to meeting the stated rehab goals.   Encounter Date: 02/19/2021   PT End of Session - 02/19/21 1212     Visit Number 6    Number of Visits 7    Date for PT Re-Evaluation 02/20/21    Authorization Type [employer]    PT Start Time 1140    PT Stop Time 1210    PT Time Calculation (min) 30 min    Activity Tolerance Patient tolerated treatment well    Behavior During Therapy WFL for tasks assessed/performed             Past Medical History:  Diagnosis Date   Arthritis    GERD (gastroesophageal reflux disease)    H/O ulcerative colitis    Hyperlipidemia    Hypertension    Inguinal hernia    Ulcer    ulcerative colitis   Ulcerative colitis    Wears glasses     Past Surgical History:  Procedure Laterality Date   APPENDECTOMY     COLECTOMY     Proctocolectomy with IPAA   COLECTOMY     FOREIGN BODY REMOVAL ABDOMINAL  02/17/2012   Procedure: REMOVAL FOREIGN BODY ABDOMINAL;  Surgeon: Odis Hollingshead, MD;  Location: [facility];  Service: General;  Laterality: N/A;  abdominal wound exploration and removal of foreign body   HERNIA REPAIR     Multiple incisional hernias.   ileoanal anastomosis      There were no vitals filed for this visit.   Subjective Assessment - 02/19/21 1142     Subjective No new  changes/complaints. Finished up with Speech and Occupational Therapy today. No falls. Still some discomfort in the stomach due to colitis.    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 no issues    How long can you stand comfortably? no issues    How long can you walk comfortably? ~15 min before L LE gets too tired    Patient Stated Goals Return to work and normal function    Currently in Pain? 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 Equal strength noted.  Mini-BESTest   Sit To Stand Normal: Comes to stand without use of hands and stabilizes independently.    Rise to Toes Normal: Stable for 3 s with maximum height.    Stand on one leg (left) Normal: 20 s.    Stand on one leg (right) Normal: 20 s.    Stand on one leg - lowest score 2    Compensatory Stepping Correction - Forward Normal: Recovers independently with a single, large step (second realignement is allowed).    Compensatory Stepping Correction - Backward Normal: Recovers independently with a single, large step    Compensatory Stepping Correction - Left Lateral Normal: Recovers independently with 1 step (crossover or lateral OK)    Compensatory Stepping Correction - Right Lateral Normal: Recovers independently with 1 step (crossover or lateral OK)    Stepping Corredtion Lateral -  lowest score 2    Stance - Feet together, eyes open, firm surface  Normal: 30s    Stance - Feet together, eyes closed, foam surface  Normal: 30s    Incline - Eyes Closed Normal: Stands independently 30s and aligns with gravity    Change in Gait Speed Normal: Significantly changes walkling speed without imbalance    Walk with head turns - Horizontal Normal: performs head turns with no change in gait speed and good balance    Walk with pivot turns Normal: Turns with feet close FAST (< 3 steps) with good balance.    Step over obstacles Normal: Able to step over box with minimal change of gait speed and with good balance.    Timed UP & GO with Dual Task Normal: No noticeable change in sitting, standing or walking while backward counting when compared to TUG without   5.45 seconds, 5.81 seconds   Mini-BEST total score 28            Reviewed and progressed HEP to patient's tolerance. No questions/concerns at this time.    Access Code: [email or web address] URL: [email or web address] Date: 02/19/2021 Prepared by: Baldomero Lamy  Exercises Single Leg Sit to Stand with Arms Extended - 1 x daily - 5 x weekly - 2 sets - 10 reps Upright Side Lunge - 1 x daily - 5 x weekly - 1 sets - 10 reps Backwards Walking - 1 x daily - 5 x weekly - 1 sets - 3 reps Sideways Walking - 1 x daily - 5 x weekly - 1 sets - 3 reps Seated Piriformis Stretch - 1 x daily - 7 x weekly - 30 sec hold Seated Figure 4 Piriformis Stretch - 1 x daily - 7 x weekly - 30 sec hold Seated Hamstring Stretch - 1 x daily - 7 x weekly - 30 sec hold      PT Education - 02/19/21 1212     Education Details progress toward LTG: Updated HEP    Person(s) Educated Patient    Methods Explanation;Demonstration;Handout    Comprehension Verbalized understanding;Returned demonstration                 PT Long Term Goals - 02/19/21 1146       PT LONG TERM GOAL #1   Title Pt will be independent with HEP and transition to  community wellness    Baseline independent with HEP, plans to return to [employer]    Time 6    Period Weeks    Status Achieved      PT LONG TERM GOAL #2   Title Pt will be able to demo  L = R LE strength when using leg press    Baseline see Strength; equal on BLE, see flowsheet    Time 6    Period Weeks    Status Achieved      PT LONG TERM GOAL #3   Title Pt will have 28/28 on mini BESTest    Baseline 24/28; 28/28    Time 6    Period Weeks    Status Achieved                   Plan - 02/19/21 1213     Clinical Impression Statement Today's skilled PT session focused on assesment of patient's progress toward all LTGs. Patient demosntrating improved strength with LLE, ability to complete equal weight on leg press in comparison to RLE. With Mini Best patient scoring 28/28 with no imbalance or significant challenge noted. Reviewed and progressed HEP to patient's tolerance, with plans to also return to [employer] upon d/c. Patient agreeable to d/c at this time.    Personal Factors and Comorbidities Age;Fitness;Time since onset of injury/illness/exacerbation    Examination-Activity Limitations Locomotion Level;Caring for Others;Carry    Examination-Participation Restrictions Cleaning;Occupation;Yard Work;Driving    Stability/Clinical Decision Making Stable/Uncomplicated    Rehab Potential Excellent    PT Frequency 1x / week    PT Duration 6 weeks    PT Treatment/Interventions ADLs/Self Care Home Management;Aquatic Therapy;Cryotherapy;Moist Heat;Gait training;Stair training;Functional mobility training;Therapeutic activities;Therapeutic exercise;Balance training;Neuromuscular re-education;Manual techniques;Patient/family education;Orthotic Fit/Training;Dry needling;Taping    PT Next Visit Plan d/c today.    PT Home Exercise Plan Access Code: [email or web address]    Consulted and Agree with Plan of Care Patient             Patient will benefit from skilled therapeutic  intervention in order to improve the following deficits and impairments:  Decreased endurance, Decreased activity tolerance, Decreased balance, Decreased strength, Decreased mobility  Visit Diagnosis: Other abnormalities of gait and mobility  Muscle weakness (generalized)     Problem List Patient Active Problem List   Diagnosis Date Noted   Hypertensive emergency 68/34/1960   Acute metabolic encephalopathy 22/97/9892   ICH (intracerebral hemorrhage) ([place]) 12/11/2020   UTI due to extended-spectrum beta lactamase (ESBL) producing Escherichia coli 01/19/2020   E coli bacteremia 01/18/2020   Severe sepsis ([place]) 01/17/2020   AKI (acute kidney injury) ([place]) 01/17/2020   Thrombocytopenia ([place]) 01/17/2020   Essential hypertension 01/17/2020   Ulcerative colitis ([place]) 01/17/2020   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 02/09/2012    Jones Bales, PT, DPT 02/19/2021, 12:16 PM  [facility] [address] [place], [place], [address] Phone: [phone]   Fax:  [phone]  Name: Matthew Francis MRN: [id number] Date of Birth: 05/05/1961

## 2021-02-19 NOTE — Therapy (Signed)
[facility] [address] [place], [place], [address] Phone: [phone]   Fax:  [phone]  Occupational Therapy Treatment & Discharge  Patient Details  Name: Matthew Francis MRN: [id number] Date of Birth: Dec 11, 1960 Referring Provider (OT): Dr. Naaman Plummer   Encounter Date: 02/19/2021   OT End of Session - 02/19/21 1102     Visit Number 10    Number of Visits 20    Date for OT Re-Evaluation 03/20/21    Authorization Type [employer]    Authorization Time Period 10 weeks to account for scheduling    Authorization - Visit Number 9    Authorization - Number of Visits 15    OT Start Time 1100    OT Stop Time 0712    OT Time Calculation (min) 45 min    Activity Tolerance Patient tolerated treatment well    Behavior During Therapy WFL for tasks assessed/performed             Past Medical History:  Diagnosis Date   Arthritis    GERD (gastroesophageal reflux disease)    H/O ulcerative colitis    Hyperlipidemia    Hypertension    Inguinal hernia    Ulcer    ulcerative colitis   Ulcerative colitis    Wears glasses     Past Surgical History:  Procedure Laterality Date   APPENDECTOMY     COLECTOMY     Proctocolectomy with IPAA   COLECTOMY     FOREIGN BODY REMOVAL ABDOMINAL  02/17/2012   Procedure: REMOVAL FOREIGN BODY ABDOMINAL;  Surgeon: Odis Hollingshead, MD;  Location: [facility];  Service: General;  Laterality: N/A;  abdominal wound exploration and removal of foreign body   HERNIA REPAIR     Multiple incisional hernias.   ileoanal anastomosis      There were no vitals filed for this visit.   Subjective Assessment - 02/19/21 1101     Subjective  "I gotta do about 4-5 different things at the same time" (talking about work)    Patient Stated Goals improve short term memory, improve hand numbness    Currently in Pain? Yes    Pain Score 3     Pain Location Abdomen    Pain Descriptors /  Indicators Discomfort    Pain Type Acute pain    Pain Onset 1 to 4 weeks ago    Pain Frequency Constant              OCCUPATIONAL THERAPY DISCHARGE SUMMARY  Visits from Start of Care: 10  Current functional level related to goals / functional outcomes: Pt increased independence with ADLs and IADLs and with RUE strength and coordination. Pt also improved with overall cognition, awareness and problem solving.    Remaining deficits: Pt continues to have some residual fatigue    Education / Equipment: HEPs   Patient agrees to discharge. Patient goals were met. Patient is being discharged due to meeting the stated rehab goals..               OT Treatments/Exercises (OP) - 02/19/21 1105       Cognitive Exercises   Sequencing Skills alphabetiing pictures on Constant Therapy level 4 with 87% accuracy and 46.29s response time.    Attention Span Divided ambulating while tossing scarf in RUE and word finding cities and states [place] with 92% accuracy.    Other Cognitive Exercises 1 Constant Therapy Read a Map Level 2 with 90% accuracy  and response time of 22.32s      Fine Motor Coordination (Hand/Wrist)   Fine Motor Coordination Small Pegboard    Small Pegboard in hand manipulation with placing pegs into board and removing them with little difficulty and minimal drops (2-3)                      OT Short Term Goals - 02/12/21 1055       OT SHORT TERM GOAL #1   Title I with inital HEP    Time 4    Period Weeks    Status Achieved   met for coordination HEP   Target Date 02/06/21      OT SHORT TERM GOAL #2   Title Pt will verbalize understanding of precautions related to sensory deficits for RUE    Time 4    Period Weeks    Status Achieved      OT SHORT TERM GOAL #3   Title Pt will demonstrate ability to cook 2 items simultaneously modified indpendently demonstrating good safety awareness.    Status Achieved      OT SHORT TERM GOAL #4   Title Pt  will perfrom mod complex envirronmental scanning task with 90% or better accuracy in prep for work and driving    Status Achieved   100% accuracy              OT Long Term Goals - 02/19/21 1120       OT LONG TERM GOAL #1   Title Pt will perfrom simulated work activities modified independently in a reasonable amount of time.    Time 10    Period Weeks    Status Achieved   has returned to work and pt reports no issues     OT LONG TERM GOAL #2   Title Pt will demonstrate improved fine motor coordination for work activities as evidenced by decreasing  RUE 9 hole peg test score by 3 secs and perform without drops    Baseline 26.13    Time 10    Period Weeks    Status Achieved   18.78s with RUE - achieved     OT LONG TERM GOAL #3   Title Pt will perfrom a physical and cognitve task simultaneously  with 90% or better accuracy in prep for driving    Time 10    Period Weeks    Status Achieved                   Plan - 02/19/21 1132     Clinical Impression Statement Pt is ready for discharge from OT. Pt has met all goals.    OT Occupational Profile and History Problem Focused Assessment - Including review of records relating to presenting problem    Occupational performance deficits (Please refer to evaluation for details): ADL's;IADL's;Work;Leisure;Social Participation    Body Structure / Function / Physical Skills ADL;UE functional use;Balance;Flexibility;Vision;FMC;ROM;Gait;GMC;Coordination;Decreased knowledge of precautions;Sensation;IADL;Decreased knowledge of use of DME;Dexterity;Strength;Mobility    Cognitive Skills Attention;Problem Solve;Thought    Rehab Potential Good    Clinical Decision Making Limited treatment options, no task modification necessary    Comorbidities Affecting Occupational Performance: May have comorbidities impacting occupational performance    Modification or Assistance to Complete Evaluation  No modification of tasks or assist necessary to  complete eval    OT Frequency 2x / week    OT Duration Other (comment)   x10 weeks   OT Treatment/Interventions Self-care/ADL training;Ultrasound;Energy conservation;Visual/perceptual  remediation/compensation;Patient/family education;DME and/or AE instruction;Aquatic Therapy;Paraffin;Passive range of motion;Balance training;Fluidtherapy;Cryotherapy;Electrical Stimulation;Splinting;Functional Mobility Training;Moist Heat;Therapeutic exercise;Manual Therapy;Therapeutic activities;Neuromuscular education;Cognitive remediation/compensation    Plan OT discharge    Consulted and Agree with Plan of Care Patient             Patient will benefit from skilled therapeutic intervention in order to improve the following deficits and impairments:   Body Structure / Function / Physical Skills: ADL, UE functional use, Balance, Flexibility, Vision, FMC, ROM, Gait, GMC, Coordination, Decreased knowledge of precautions, Sensation, IADL, Decreased knowledge of use of DME, Dexterity, Strength, Mobility Cognitive Skills: Attention, Problem Solve, Thought     Visit Diagnosis: Frontal lobe and executive function deficit  Muscle weakness (generalized)  Other lack of coordination  Attention and concentration deficit  Visuospatial deficit  Other disturbances of skin sensation    Problem List Patient Active Problem List   Diagnosis Date Noted   Hypertensive emergency 76/19/5093   Acute metabolic encephalopathy 26/71/2458   ICH (intracerebral hemorrhage) ([place]) 12/11/2020   UTI due to extended-spectrum beta lactamase (ESBL) producing Escherichia coli 01/19/2020   E coli bacteremia 01/18/2020   Severe sepsis ([place]) 01/17/2020   AKI (acute kidney injury) ([place]) 01/17/2020   Thrombocytopenia ([place]) 01/17/2020   Essential hypertension 01/17/2020   Ulcerative colitis ([place]) 01/17/2020   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 02/09/2012    Zachery Conch MOT, OTR/L  02/19/2021, 11:41  AM  [facility] [address] [place], [place], [address] Phone: [phone]   Fax:  [phone]  Name: Matthew Francis MRN: [id number] Date of Birth: 06-07-1961

## 2021-02-22 ENCOUNTER — Ambulatory Visit: Payer: 59 | Admitting: Occupational Therapy

## 2021-02-22 ENCOUNTER — Ambulatory Visit: Payer: 59

## 2021-03-22 ENCOUNTER — Telehealth: Payer: Self-pay

## 2021-03-22 NOTE — Telephone Encounter (Signed)
Matthew Francis a walk-in:   Patient stated he  needed a PA for Rx Rifaximin that was written in August 2022. He was able to get the first script but refills needed a PA.    [facility] was called a new PA is in progress

## 2021-03-26 ENCOUNTER — Telehealth: Payer: Self-pay

## 2021-03-26 NOTE — Telephone Encounter (Signed)
Matthew Francis Xifaxan 550 MG refill were not approved by  [facility]. He was able to get one fill in August.  Patient has been advised to call his insurance for an appeal or look for discounts. He will also check with his GI provider.

## 2021-03-27 DIAGNOSIS — K580 Irritable bowel syndrome with diarrhea: Principal | ICD-10-CM

## 2021-03-27 MED ORDER — RIFAXIMIN 550 MG TABLET
ORAL_TABLET | Freq: Two times a day (BID) | ORAL | 2 refills | 30.00000 days | Status: CP
Start: 2021-03-27 — End: ?

## 2021-04-01 NOTE — Telephone Encounter (Signed)
Patient called on the 14th stating he missed a few calls. I called patient and left message that [facility] was trying to contact him about the medication Xifaxan and that it was denied but  looks like the GI doctor tried to appeal it.

## 2021-04-03 ENCOUNTER — Other Ambulatory Visit: Payer: Self-pay

## 2021-04-03 ENCOUNTER — Encounter: Payer: 59 | Attending: Registered Nurse | Admitting: Physical Medicine & Rehabilitation

## 2021-04-03 ENCOUNTER — Encounter: Payer: Self-pay | Admitting: Physical Medicine & Rehabilitation

## 2021-04-03 VITALS — BP 154/95 | HR 74 | Temp 98.4°F | Ht 70.0 in | Wt 222.1 lb

## 2021-04-03 DIAGNOSIS — I10 Essential (primary) hypertension: Secondary | ICD-10-CM | POA: Insufficient documentation

## 2021-04-03 DIAGNOSIS — I610 Nontraumatic intracerebral hemorrhage in hemisphere, subcortical: Secondary | ICD-10-CM | POA: Insufficient documentation

## 2021-04-03 DIAGNOSIS — G479 Sleep disorder, unspecified: Secondary | ICD-10-CM | POA: Diagnosis present

## 2021-04-03 MED ORDER — HYDRALAZINE HCL 100 MG PO TABS: 100.0000 mg | ORAL_TABLET | Freq: Three times a day (TID) | ORAL | 4 refills | Status: DC

## 2021-04-03 MED ORDER — QUETIAPINE FUMARATE 25 MG PO TABS: 25.0000 mg | ORAL_TABLET | Freq: Every day | ORAL | 0 refills | Status: DC

## 2021-04-03 NOTE — Progress Notes (Signed)
Subjective:    Patient ID: Matthew Francis, male    DOB: 1960-10-20, 60 y.o.   MRN: [id number]  HPI Matthew Francis is here after his ICH. 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 He gets home at 11PM. He tries to go to bed around 0030, to 0100. He has struggle falling asleep and often doesn't fall asleep until 0500 or so. He has tried melatonin up to 49m, benadryl without benefit.   Otherwise his mood is pretty good. He wants to be doing more than he is but doesn't always have the energy to do so.   He has occasional pain from his crohn's disease but otherwise is not having pain or headache.   Pain Inventory Average Pain 4 Pain Right Now 4 My pain is dull  LOCATION OF PAIN  shoulder  BOWEL Number of stools per week: 40-50 Oral laxative use No  Type of laxative na Enema or suppository use No  History of colostomy No  Incontinent No   BLADDER Normal In and out cath, frequency na Able to self cath  na Bladder incontinence No  Frequent urination No  Leakage with coughing No  Difficulty starting stream No  Incomplete bladder emptying No    Mobility walk without assistance how many minutes can you walk? 30 ability to climb steps?  yes do you drive?  yes  Function employed # of hrs/week 20+ what is your job? [employer]  Neuro/Psych weakness depression  Prior Studies Any changes since last visit?  no  Physicians involved in your care GI doctor   Family History  Problem Relation Age of Onset   Cancer Sister    Social History   Socioeconomic History   Marital status: Single    Spouse name: Not on file   Number of children: Not on file   Years of education: Not on file   Highest education level: Not on file  Occupational History   Not on file  Tobacco Use   Smoking status: Never   Smokeless tobacco: Never  Vaping Use   Vaping Use: Never used  Substance and Sexual Activity   Alcohol use: Yes    Alcohol/week: 4.0 standard drinks    Types: 4 Cans  of beer per week    Comment: 4 cans of beer per week   Drug use: No   Sexual activity: Not Currently  Other Topics Concern   Not on file  Social History Narrative   Not on file   Social Determinants of Health   Financial Resource Strain: Not on file  Food Insecurity: Not on file  Transportation Needs: Not on file  Physical Activity: Not on file  Stress: Not on file  Social Connections: Not on file   Past Surgical History:  Procedure Laterality Date   APPENDECTOMY     COLECTOMY     Proctocolectomy with IPAA   COLECTOMY     FOREIGN BODY REMOVAL ABDOMINAL  02/17/2012   [place]  Surgeon: TOdis Hollingshead MD;  Location[place] [facility]  Service: General;  Laterality: N/A;  abdominal wound exploration and removal of foreign body   HERNIA REPAIR     Multiple incisional hernias.   ileoanal anastomosis     Past Medical History:  Diagnosis Date   Arthritis    GERD (gastroesophageal reflux disease)    H/O ulcerative colitis    Hyperlipidemia    Hypertension    Inguinal hernia  Ulcer    ulcerative colitis   Ulcerative colitis    Wears glasses    BP (!) 154/95   Pulse 74   Temp 98.4 F (36.9 C) (Oral)   Ht 5' 10"  (1.778 m)   Wt 222 lb 1.6 oz (100.7 kg)   SpO2 95%   BMI 31.87 kg/m   Opioid Risk Score:   Fall Risk Score:  `1  Depression screen PHQ 2/9  Depression screen [facility] 2/9 04/03/2021 01/30/2021 01/11/2021  Decreased Interest 3 1 1   Down, Depressed, Hopeless 3 1 1   PHQ - 2 Score 6 2 2   Altered sleeping 3 - 1  Tired, decreased energy 3 - 1  Change in appetite 3 - 0  Feeling bad or failure about yourself  3 - 1  Trouble concentrating 3 - 0  Moving slowly or fidgety/restless 0 - 0  Suicidal thoughts 0 - 0  PHQ-9 Score 21 - 5  Difficult doing work/chores Somewhat difficult - -     Review of Systems  Gastrointestinal:  Positive for abdominal pain, diarrhea and nausea.  Neurological:  Positive for weakness.   Psychiatric/Behavioral:  Positive for dysphoric mood.   All other systems reviewed and are negative.     Objective:   Physical Exam  Constitutional: No distress . Vital signs reviewed. HEENT: NCAT, EOMI, oral membranes moist Neck: supple Cardiovascular: RRR without murmur. No JVD    Respiratory/Chest: CTA Bilaterally without wheezes or rales. Normal effort    GI/Abdomen: BS +, non-tender, non-distended Ext: no clubbing, cyanosis, or edema Psych: pleasant and cooperative  Skin: intact Neuro:Alert and oriented x 3. Normal insight and awareness. Intact Memory. Normal language and speech. Cranial nerve exam unremarkable  Strength 5/5 in all 4's. Normal balance, gait. Sl sensory loss tip of left 5th finger is still present Musculoskeletal: Full ROM, No pain with AROM or PROM in the neck, trunk, or extremities. Posture appropriate            Assessment & Plan:    1.  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  Pain Management: headaches resolved.              -off topamax 3. Mood/sleep: ego support of family -resume seroquel 25-49m at bedtime -may continue melatonin up to 16m       4.  Hypertension.  Clonidine 01 mg daily, Norvasc 10 mg daily, hydralazine 100 mg twice daily.  Monitor with increased mobility             -bp climbing  -increase hydralazine to 10077mid 5.  Ulcerative colitis.Xifaxan 550 mg twice daily, Entocort 9 mg daily             -per GI--on cipro currently 6. Type 2 diabetes mellitus.  Hemoglobin A1c 5.9.  SSI.              -sugars generally controlled in 100's           Fifteen minutes of face to face patient care time were spent during this visit. All questions were encouraged and answered.  Follow up with me in 3 mos .

## 2021-04-18 ENCOUNTER — Telehealth: Payer: Self-pay

## 2021-04-18 NOTE — Telephone Encounter (Signed)
[facility] sent a refill request for Amlodipine. Your last note says to continue it, but are you going to continue to refill it? Please advise

## 2021-04-23 ENCOUNTER — Other Ambulatory Visit: Payer: Self-pay

## 2021-04-23 MED ORDER — VITAMIN D3 1.25 MG (50000 UT) PO CAPS: 50000.0000 [IU] | ORAL_CAPSULE | ORAL | 0 refills | Status: DC

## 2021-05-03 ENCOUNTER — Other Ambulatory Visit: Payer: Self-pay | Admitting: Physical Medicine & Rehabilitation

## 2021-05-03 DIAGNOSIS — G479 Sleep disorder, unspecified: Secondary | ICD-10-CM

## 2021-06-26 ENCOUNTER — Other Ambulatory Visit: Payer: Self-pay

## 2021-06-26 ENCOUNTER — Encounter: Payer: 59 | Attending: Registered Nurse | Admitting: Physical Medicine & Rehabilitation

## 2021-06-26 ENCOUNTER — Encounter: Payer: Self-pay | Admitting: Physical Medicine & Rehabilitation

## 2021-06-26 VITALS — BP 132/78 | HR 105 | Temp 98.5°F | Ht 70.0 in | Wt 218.0 lb

## 2021-06-26 DIAGNOSIS — G9341 Metabolic encephalopathy: Secondary | ICD-10-CM | POA: Diagnosis present

## 2021-06-26 DIAGNOSIS — I10 Essential (primary) hypertension: Secondary | ICD-10-CM | POA: Insufficient documentation

## 2021-06-26 MED ORDER — HYDRALAZINE HCL 100 MG PO TABS: 100.0000 mg | ORAL_TABLET | Freq: Three times a day (TID) | ORAL | 4 refills | Status: DC

## 2021-06-26 MED ORDER — CLONIDINE HCL 0.1 MG PO TABS: 0.1000 mg | ORAL_TABLET | Freq: Three times a day (TID) | ORAL | 11 refills | Status: DC

## 2021-06-26 NOTE — Progress Notes (Signed)
Subjective:    Patient ID: Matthew Francis, male    DOB: 1961-05-18, 61 y.o.   MRN: [id number]  HPI  Matthew Francis is here in follow up of his metabolic encephalopathy and associated deficits. He's having more headaches which seem to happen after he eats. Stress can increase them. 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 He is working 40 hours per week.   For his blood pressure he is taking hydralazine 100 mg 3 times daily as well as Norvasc 10 mg daily and clonidine 0.1 mg daily.  We increased hydralazine at his last visit.  We had taken him off Topamax previously as headaches had resolved.  He says that things are going fairly well with work.  In general he is improving although he does not feel back to his old self just yet.  He states that his energy levels are back to what he is accustomed to.  Mood is fairly positive.  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  Pain Inventory Average Pain 3 Pain Right Now 5 My pain is dull and aching  In the last 24 hours, has pain interfered with the following? General activity 4 Relation with others 3 Enjoyment of life 6 What TIME of day is your pain at its worst? varies Sleep (in general) Poor  Pain is worse with:  stress, after eating Pain improves with: medication Relief from Meds: 5  Family History  Problem Relation Age of Onset   Cancer Sister    Social History   Socioeconomic History   Marital status: Single    Spouse name: Not on file   Number of children: Not on file   Years of education: Not on file   Highest education level: Not on file  Occupational History   Not on file  Tobacco Use   Smoking status: Never   Smokeless tobacco: Never  Vaping Use   Vaping Use: Never used  Substance and Sexual Activity   Alcohol use: Yes    Alcohol/week: 4.0 standard drinks    Types: 4 Cans of beer per week     Comment: 4 cans of beer per week   Drug use: No   Sexual activity: Not Currently  Other Topics Concern   Not on file  Social History Narrative   Not on file   Social Determinants of Health   Financial Resource Strain: Not on file  Food Insecurity: Not on file  Transportation Needs: Not on file  Physical Activity: Not on file  Stress: Not on file  Social Connections: Not on file   Past Surgical History:  Procedure Laterality Date   APPENDECTOMY     COLECTOMY     Proctocolectomy with IPAA   COLECTOMY     FOREIGN BODY REMOVAL ABDOMINAL  02/17/2012   Procedure: [place];  Surgeon: Odis Hollingshead, MD;  Location: [place];  Service: General;  Laterality: N/A;  abdominal wound exploration and removal of foreign body   HERNIA REPAIR     Multiple incisional hernias.   ileoanal anastomosis     Past Surgical History:  Procedure Laterality Date   APPENDECTOMY     COLECTOMY     Proctocolectomy with IPAA   COLECTOMY     FOREIGN BODY REMOVAL ABDOMINAL  02/17/2012   Procedure: REMOVAL FOREIGN  BODY ABDOMINAL;  Surgeon: Odis Hollingshead, MD;  Location: [place];  Service: General;  Laterality: N/A;  abdominal wound exploration and removal of foreign body   HERNIA REPAIR     Multiple incisional hernias.   ileoanal anastomosis     Past Medical History:  Diagnosis Date   Arthritis    GERD (gastroesophageal reflux disease)    H/O ulcerative colitis    Hyperlipidemia    Hypertension    Inguinal hernia    Ulcer    ulcerative colitis   Ulcerative colitis    Wears glasses    BP 132/78    Pulse (!) 105    Temp 98.5 F (36.9 C) (Oral)    Ht 5' 10"  (1.778 m)    Wt 218 lb (98.9 kg)    SpO2 98%    BMI 31.28 kg/m   Opioid Risk Score:   Fall Risk Score:  `1  Depression screen PHQ 2/9  Depression screen [facility] 2/9 04/03/2021 01/30/2021 01/11/2021  Decreased Interest 3 1 1   Down, Depressed, Hopeless 3 1 1   PHQ - 2 Score 6 2 2   Altered  sleeping 3 - 1  Tired, decreased energy 3 - 1  Change in appetite 3 - 0  Feeling bad or failure about yourself  3 - 1  Trouble concentrating 3 - 0  Moving slowly or fidgety/restless 0 - 0  Suicidal thoughts 0 - 0  PHQ-9 Score 21 - 5  Difficult doing work/chores Somewhat difficult - -     Review of Systems  Musculoskeletal:        Right arm pain  Neurological:  Positive for headaches.  All other systems reviewed and are negative.     Objective:   Physical Exam  Constitutional: No distress . Vital signs reviewed. HEENT: NCAT, EOMI, oral membranes moist Neck: supple Cardiovascular: RRR without murmur. No JVD    Respiratory/Chest: CTA Bilaterally without wheezes or rales. Normal effort    GI/Abdomen: BS +, non-tender, non-distended Ext: no clubbing, cyanosis, or edema Psych: pleasant and cooperative   Skin: intact Neuro:Alert and oriented x 3. Normal insight and awareness. Intact Memory. Normal language and speech. Cranial nerve exam unremarkable  Strength 5/5 in all 4's. Functional balance. Sensation grossly intact Musculoskeletal: Full ROM, No pain with AROM or PROM in the neck, trunk, or extremities. Posture excellent            Assessment & Plan:    1.  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  Encouraged him to continue.  I suspect his energy will improve with time. 2.  Pain Management: headaches resolved.              -consider resuming topamax if bp control doesn't help 3. Mood/sleep: ego support of family -continue seroquel 25-49m at bedtime which seems to work most nights - melatonin 143m       4.  Hypertension.  Clonidine 01 mg daily, Norvasc 10 mg daily, hydralazine 100 mg twice daily.     -bp elevated at time, especially with stress             -maintain hydralazine 10011mid  -increase clonidine to 0.1mg30mD  (start at bid) 5.  Ulcerative colitis.Xifaxan 550 mg twice daily, Entocort 9 mg daily             -  per GI--  6. Type 2 diabetes mellitus.  Hemoglobin A1c 5.9.  SSI.              -sugars generally controlled              15 min  of face to face patient care time were spent during this visit. All questions were encouraged and answered.  Follow up with me in 6 mos .

## 2021-06-26 NOTE — Patient Instructions (Addendum)
PLEASE FEEL FREE TO CALL OUR OFFICE WITH ANY PROBLEMS OR QUESTIONS ([phone])   START CLONIDINE AT 0.1MG TWICE DAILY.  IF YOUR BP IS STILL RUNNING HIGH, INCREASE TO THREE X DAILY   IF BP CONTROL IMPROVES BUT HEADACHES DO NOT, WE CAN ADD BACK TOPAMAX

## 2021-06-27 ENCOUNTER — Ambulatory Visit (INDEPENDENT_AMBULATORY_CARE_PROVIDER_SITE_OTHER): Payer: 59 | Admitting: Adult Health

## 2021-06-27 ENCOUNTER — Encounter: Payer: Self-pay | Admitting: Adult Health

## 2021-06-27 VITALS — BP 133/82 | HR 102 | Ht 70.5 in | Wt 221.0 lb

## 2021-06-27 DIAGNOSIS — I10 Essential (primary) hypertension: Secondary | ICD-10-CM

## 2021-06-27 DIAGNOSIS — I610 Nontraumatic intracerebral hemorrhage in hemisphere, subcortical: Secondary | ICD-10-CM | POA: Diagnosis not present

## 2021-06-27 DIAGNOSIS — E785 Hyperlipidemia, unspecified: Secondary | ICD-10-CM | POA: Diagnosis not present

## 2021-06-27 DIAGNOSIS — F331 Major depressive disorder, recurrent, moderate: Secondary | ICD-10-CM | POA: Diagnosis not present

## 2021-06-27 DIAGNOSIS — K91850 Pouchitis: Principal | ICD-10-CM

## 2021-06-27 MED ORDER — CIPROFLOXACIN 500 MG TABLET
ORAL_TABLET | 2 refills | 0 days | Status: CP
Start: 2021-06-27 — End: ?

## 2021-06-27 NOTE — Patient Instructions (Addendum)
Continue Crestor 80m daily  for secondary stroke prevention  Come back next week at some point to check cholesterol levels   You will be called to schedule evaluation with behavioral health   Continue to follow up with PCP regarding cholesterol and blood pressure management  Maintain strict control of hypertension with blood pressure goal below 130/90 and cholesterol with LDL cholesterol (bad cholesterol) goal below 70 mg/dL.   Signs of a Stroke? Follow the BEFAST method:  Balance Watch for a sudden loss of balance, trouble with coordination or vertigo Eyes Is there a sudden loss of vision in one or both eyes? Or double vision?  Face: Ask the person to smile. Does one side of the face droop or is it numb?  Arms: Ask the person to raise both arms. Does one arm drift downward? Is there weakness or numbness of a leg? Speech: Ask the person to repeat a simple phrase. Does the speech sound slurred/strange? Is the person confused ? Time: If you observe any of these signs, call 911.     Followup in the future with me in 6 months or call earlier if needed       [facility]. I hope we have been able to provide you high quality care today.  You may receive a patient satisfaction survey over the next few weeks. We would appreciate your feedback and comments so that we may continue to improve ourselves and the health of our patients.

## 2021-06-27 NOTE — Progress Notes (Deleted)
[facility] [address] [place]. [place] [address] [phone]       STROKE FOLLOW UP NOTE  [name] Date of Birth:  1960-11-27 Medical Record Number:  [id number]   Reason for Referral: stroke follow up    SUBJECTIVE:   CHIEF COMPLAINT:  Chief Complaint  Patient presents with   Right Cerebral Hemorrhage    Rm 2, 4 month FU  "no new concerns"      HPI:   Update 06/27/2021 JM: Returns for 87-monthstroke follow-up unaccompanied.  Overall stable without new stroke/TIA symptoms.  Reports residual ***.  Completed therapies back in September. Continues to work without great difficulty.  On seroquel and melatonin for mood/sleep per PMR Dr. SNaaman Plummer Remains on Crestor and Zetia without side effects.  Blood pressure today ***.  Glucose levels stable ***.       History provided for reference purposes only Initial visit 02/14/2021 JM: [name] for hospital follow up unaccompanied. Overall doing well. Continues to work with therapies with ongoing improvement for residual mild short term memory difficulties, left hand 4-5th digit fingertip numbness and slight decrease left hand dexterity.  Reports ongoing great improvement.  He has since returned back to work at [employer] difficulty.  Denies new stroke/TIA symptoms.  Remains on Crestor 10 mg every other day (dose not adjusted at d/c) as well as Zetia.  Blood pressure today 128/79.  Stable at home.  Routine monitoring glucose levels which have been stable.  Reports modifying diet and overall making better health choices.  Denies any additional drug use.  No further concerns at this time.  Stroke admission 12/11/2020 Mr. DJERREMY MAIONEis a 61y.o. male with history of ulcerative colitis, HTN, HLD, DM, CKD, lone A. fib admitted on 12/11/2020 for headache, confusion, nausea vomiting.  Personally reviewed hospitalization from progress notes, lab work and imaging.  Evaluated by Dr. 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  CT head showed IPH right lateral ventricle, third ventricle and partial fourth ventricle.  Overnight 7/1, increased confusion with stat CT stable hemorrhage and mild hydrocephalus.  Neurosurgery eval no intervention required with repeat imaging 7/2 improved hydrocephalus.  EEG unremarkable.  MRA unremarkable without aneurysm or AVM identified.  Carotid Dopplers unremarkable.  EF 60 to 65%.  LDL 166 -increase home dose Crestor from 10 mg to 20 mg daily and ongoing use of Zetia.  A1c 5.9.  UDS positive for cocaine.  No prior stroke history.  Hospital course complicated by ulcerative colitis, AKI, c/o severe lower back spasms with radiculopathy and bouts of delirium with agitation and combativeness placed on Risperdal.  Discharge to CIR on 7/8 per therapy recommendations and discharged home on 01/01/2021.      PERTINENT IMAGING  CT HEAD   12/11/2020 IMPRESSION: 1. Acute hemorrhage within the right caudate/basal ganglia with hemorrhage in the right lateral, third, and fourth ventricles. No midline shift. The ventricles are slightly dilated as compared with prior CT from 2021. 2. Mild chronic small vessel ischemic change of the white matter.  12/13/2020 IMPRESSION: Stable right basal ganglia hemorrhage with intraventricular extension. No change since prior study.  12/14/2020 IMPRESSION: Persistent hemorrhage in the head of the caudate nucleus on the right with evidence of interventricular thrombus in the lateral ventricles bilaterally and third ventricle. There is interval dilatation of the lateral and third ventricles with findings of thrombus and apparent occlusion  of the cerebral aqueduct related to change in the morphology of the third ventricular thrombus. Additionally signs of increased intracranial pressure are noted with decreased sulcal markings when compared with the  previous day.  12/15/2020 IMPRESSION: 1. Improved hydrocephalus. 2. Persistent intraventricular extension of hemorrhage from the right caudate head hematoma.  12/17/2020 IMPRESSION: 1. Unchanged intraparenchymal hemorrhage in the right caudate head extending into the ventricular system. 2. Unchanged mild communicating hydrocephalus.    MRI BRAIN 12/12/2020 MRA HEAD IMPRESSION: 1. Unchanged intraparenchymal hematoma of the right caudate head with intraventricular extension and mild communicating hydrocephalus. 2. Normal intracranial MRA.         ROS:   14 system review of systems performed and negative with exception of those listed in HPI  PMH:  Past Medical History:  Diagnosis Date   Arthritis    GERD (gastroesophageal reflux disease)    H/O ulcerative colitis    Hyperlipidemia    Hypertension    Inguinal hernia    Nontraumatic subcortical hemorrhage of brain ([place])    Ulcer    ulcerative colitis   Ulcerative colitis    Wears glasses     PSH:  Past Surgical History:  Procedure Laterality Date   APPENDECTOMY     COLECTOMY     Proctocolectomy with IPAA   COLECTOMY     FOREIGN BODY REMOVAL ABDOMINAL  02/17/2012   Procedure: REMOVAL FOREIGN BODY ABDOMINAL;  Surgeon: Odis Hollingshead, MD;  Location: [place];  Service: General;  Laterality: N/A;  abdominal wound exploration and removal of foreign body   HERNIA REPAIR     Multiple incisional hernias.   ileoanal anastomosis      Social History:  Social History   Socioeconomic History   Marital status: Single    Spouse name: Not on file   Number of children: Not on file   Years of education: Not on file   Highest education level: Not on file  Occupational History    Comment: restaurant  Tobacco Use   Smoking status: Never   Smokeless tobacco: Never  Vaping Use   Vaping Use: Never used  Substance and Sexual Activity   Alcohol use: Yes    Alcohol/week: 4.0 standard drinks    Types: 4  Cans of beer per week    Comment: 3 cans of beer per week   Drug use: No   Sexual activity: Not Currently  Other Topics Concern   Not on file  Social History Narrative   Not on file   Social Determinants of Health   Financial Resource Strain: Not on file  Food Insecurity: Not on file  Transportation Needs: Not on file  Physical Activity: Not on file  Stress: Not on file  Social Connections: Not on file  Intimate Partner Violence: Not on file    Family History:  Family History  Problem Relation Age of Onset   Cancer Sister     Medications:   Current Outpatient Medications on File Prior to Visit  Medication Sig Dispense Refill   acetaminophen (TYLENOL) 325 MG tablet Take 2 tablets (650 mg total) by mouth every 6 (six) hours as needed for moderate pain or mild pain.     amLODipine (NORVASC) 10 MG tablet Take 1 tablet (10 mg total) by mouth daily. 30 tablet 2   ascorbic acid (VITAMIN C) 500 MG tablet Take 1 tablet (500 mg total) by mouth daily. 30 tablet 0   budesonide (ENTOCORT EC) 3 MG 24 hr capsule Take  3 capsules (9 mg total) by mouth daily. 90 capsule 4   cetirizine (ZYRTEC) 10 MG tablet Take 10 mg by mouth daily as needed for allergies.      Cholecalciferol (VITAMIN D3) 1.25 MG (50000 UT) CAPS Take 50,000 Units by mouth every Wednesday. 3 capsule 0   ciprofloxacin (CIPRO) 500 MG tablet Take 500 mg by mouth 2 (two) times daily.     cloNIDine (CATAPRES) 0.1 MG tablet Take 1 tablet (0.1 mg total) by mouth 3 (three) times daily. 90 tablet 11   dicyclomine (BENTYL) 10 MG capsule Take 10 mg by mouth every 6 (six) hours as needed.     esomeprazole (NEXIUM) 20 MG capsule Take 1 capsule (20 mg total) by mouth daily. 30 capsule 4   ezetimibe (ZETIA) 10 MG tablet Take 10 mg by mouth daily.     hydrALAZINE (APRESOLINE) 100 MG tablet Take 1 tablet (100 mg total) by mouth 3 (three) times daily. 60 tablet 4   loperamide (IMODIUM) 2 MG capsule Take 2 capsules (4 mg total) by mouth as  needed for diarrhea or loose stools. 30 capsule 0   melatonin 3 MG TABS tablet Take 1 tablet (3 mg total) by mouth at bedtime. 30 tablet 0   methocarbamol (ROBAXIN) 500 MG tablet Take 1 tablet (500 mg total) by mouth every 8 (eight) hours as needed for muscle spasms. 60 tablet 4   Multiple Vitamin (MULTIVITAMIN WITH MINERALS) TABS Take 1 tablet by mouth daily.     psyllium (METAMUCIL) 58.6 % packet Take 1 packet by mouth daily as needed (For fiber/thickener).      QUEtiapine (SEROQUEL) 25 MG tablet TAKE 1 TO 2 TABLETS(25 TO 50 MG) BY MOUTH AT BEDTIME 60 tablet 1   rifaximin (XIFAXAN) 550 MG TABS tablet Take 1 tablet (550 mg total) by mouth 2 (two) times daily. 42 tablet 4   rosuvastatin (CRESTOR) 10 MG tablet Take 1 tablet (10 mg total) by mouth daily. 30 tablet 4   Turmeric (QC TUMERIC COMPLEX PO) Take 1 tablet by mouth daily as needed (For inflammatory).     vitamin B-12 (CYANOCOBALAMIN) 100 MCG tablet Take 1 tablet (100 mcg total) by mouth daily. 30 tablet 0   No current facility-administered medications on file prior to visit.    Allergies:   Allergies  Allergen Reactions   Asacol [Mesalamine] Swelling    Throat    Penicillins Anaphylaxis    Has patient had a PCN reaction causing immediate rash, facial/tongue/throat swelling, SOB or lightheadedness with hypotension: Yes Has patient had a PCN reaction causing severe rash involving mucus membranes or skin necrosis: No Has patient had a PCN reaction that required hospitalization: Yes Has patient had a PCN reaction occurring within the last 10 years: No If all of the above answers are "NO", then may proceed with Cephalosporin use.   Sulfa Antibiotics Anaphylaxis   Atorvastatin Other (See Comments)    Myalgia, muscle cramps   Bee Pollen Hives   Bee Venom Hives and Swelling   Iodinated Contrast Media     Pt reports allergy at [place]. Rapid heartbeat and dyspnea      OBJECTIVE:  Physical Exam  Vitals:   06/27/21 1046  BP:  133/82  Pulse: (!) 102  Weight: 221 lb (100.2 kg)  Height: 5' 10.5" (1.791 Francis)    Body mass index is 31.26 kg/Francis. No results found.  Post stroke PHQ 2/9 Depression screen PHQ 2/9 06/26/2021  Decreased Interest 1  Down, Depressed, Hopeless  1  PHQ - 2 Score 2  Altered sleeping -  Tired, decreased energy -  Change in appetite -  Feeling bad or failure about yourself  -  Trouble concentrating -  Moving slowly or fidgety/restless -  Suicidal thoughts -  PHQ-9 Score -  Difficult doing work/chores -     General: well developed, well nourished, very pleasant middle-aged African-American male, seated, in no evident distress Head: head normocephalic and atraumatic.   Neck: supple with no carotid or supraclavicular bruits Cardiovascular: regular rate and rhythm, no murmurs Musculoskeletal: no deformity Skin:  no rash/petichiae Vascular:  Normal pulses all extremities   Neurologic Exam Mental Status: Awake and fully alert.  Fluent speech and language.  Oriented to place and time. Recent and remote memory intact. Attention span, concentration and fund of knowledge appropriate. Mood and affect appropriate.  Cranial Nerves: Pupils equal, briskly reactive to light. Extraocular movements full without nystagmus. Visual fields full to confrontation. Hearing intact. Facial sensation intact. Face, tongue, palate moves normally and symmetrically.  Motor: Normal bulk and tone. Normal strength in all tested extremity muscles except slight decrease left hand dexterity and slight L ADF weakness.  Orbits right arm over left arm. Sensory.: intact to touch , pinprick , position and vibratory sensation.  Coordination: Rapid alternating movements normal in all extremities except slightly decreased left hand. Finger-to-nose and heel-to-shin performed accurately bilaterally. Gait and Station: Arises from chair without difficulty. Stance is normal. Gait demonstrates normal stride length and balance with out use  of assistive device. Tandem walk and heel toe mild difficulty.  Reflexes: 1+ and symmetric. Toes downgoing.         ASSESSMENT: Matthew Francis is a 62 y.o. 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 Vascular risk factors include HTN, HLD, DM, CKD, cocaine abuse and obesity.      PLAN:  Right ICH with IVH:  Residual deficit: Recovered well with minimal residual deficits.  Completed therapies - continue HEP as advised.  Continue Crestor 34m daily *** and Zetia for secondary stroke prevention measures Discussed secondary stroke prevention measures and importance of close PCP follow up for aggressive stroke risk factor management. I have gone over the pathophysiology of stroke, warning signs and symptoms, risk factors and their management in some detail with instructions to go to the closest emergency room for symptoms of concern. HTN: BP goal <130/90.  Stable on current regimen per PCP HLD: LDL goal <70. Recent LDL 110 (02/2021) down from 166. Increased Crestor from 110mevery other day to daily due to continued elevated LDL. Repeat lipids *** DMII: A1c goal<7.0. Recent A1c 5.9. managed by PCP Cocaine abuse: complete cessation since d/c. Highly encouraged.     Follow up in 4 months or call earlier if needed   CC:  PCP: HaCharline BillsMD    I spent 54 minutes of face-to-face and non-face-to-face time with patient.  This included previsit chart review, lab review, study review, order entry, electronic health record documentation, patient education regarding recent stroke including etiology, secondary stroke prevention measures and importance of managing stroke risk factors, residual deficits and typical recovery time and answered all other questions to patient satisfaction   JeFrann RiderAGNP-BC  [facility] [address] [id number] [phone]: This document was prepared with digital dictation and possible  smart Company secretary. Any transcriptional errors that result from this process are unintentional.

## 2021-06-27 NOTE — Progress Notes (Signed)
[facility] [address] [place]. [place] [address] [phone]       STROKE FOLLOW UP NOTE  [name] Date of Birth:  [birth year] Medical Record Number:  [id number]   Reason for Referral: stroke follow up   SUBJECTIVE:   CHIEF COMPLAINT:  Chief Complaint  Patient presents with   Right Cerebral Hemorrhage    Rm 2, 4 month FU  "no new concerns"       HPI:   Update 06/27/2021 JM: Returns for 51-monthstroke follow-up unaccompanied.  Overall stable without new stroke/TIA symptoms.  Reports improvement of left hand weakness. Report chronic right hand 3-5th digit finger numbness - followed by ortho.  Completed therapies back in September. Continues to work without great difficulty.  On seroquel and melatonin for mood/sleep per PMR Dr. SNaaman Francis Reports increasing depression - interested in seeing behavioral health. Denies SI/HI. Remains on Crestor and Zetia without side effects.  Blood pressure today 133/82.  Glucose levels stable. No further concerns at this time.     History provided for reference purposes only Initial visit 02/14/2021 JM: [name] for hospital follow up unaccompanied. Overall doing well. Continues to work with therapies with ongoing improvement for residual mild short term memory difficulties, left hand 4-5th digit fingertip numbness and slight decrease left hand dexterity.  Reports ongoing great improvement.  He has since returned back to work at [employer] difficulty.  Denies new stroke/TIA symptoms.  Remains on Crestor 10 mg every other day (dose not adjusted at d/c) as well as Zetia.  Blood pressure today 128/79.  Stable at home.  Routine monitoring glucose levels which have been stable.  Reports modifying diet and overall making better health choices.  Denies any additional drug use.  No further concerns at this time.  Stroke admission 12/11/2020 Mr. Matthew MOLLICAis a 61y.o. male with history of ulcerative  colitis, HTN, HLD, DM, CKD, lone A. fib admitted on 12/11/2020 for headache, confusion, nausea vomiting.  Personally reviewed hospitalization from progress notes, lab work and imaging.  Evaluated by Dr. XErlinda Hongfor right carotid head ICH with IVH likely secondary to hypertensive emergency, cocaine vasculopathy small vessel disease.  CT head showed IPH right lateral ventricle, third ventricle and partial fourth ventricle.  Overnight 7/1, increased confusion with stat CT stable hemorrhage and mild hydrocephalus.  Neurosurgery eval no intervention required with repeat imaging 7/2 improved hydrocephalus.  EEG unremarkable.  MRA unremarkable without aneurysm or AVM identified.  Carotid Dopplers unremarkable.  EF 60 to 65%.  LDL 166 -increase home dose Crestor from 10 mg to 20 mg daily and ongoing use of Zetia.  A1c 5.9.  UDS positive for cocaine.  No prior stroke history.  Hospital course complicated by ulcerative colitis, AKI, c/o severe lower back spasms with radiculopathy and bouts of delirium with agitation and combativeness placed on Risperdal.  Discharge to CIR on 7/8 per therapy recommendations and discharged home on 01/01/2021.      PERTINENT IMAGING  CT HEAD   12/11/2020 IMPRESSION: 1. Acute hemorrhage within the right caudate/basal ganglia with hemorrhage in the right lateral, third, and fourth ventricles. No midline shift. The ventricles are slightly dilated as compared with prior CT from 2021. 2. Mild chronic small vessel ischemic change of the white matter.  12/13/2020 IMPRESSION: Stable right basal ganglia hemorrhage with intraventricular extension. No change since prior study.  12/14/2020 IMPRESSION: Persistent hemorrhage in the head of the caudate nucleus on the right with  evidence of interventricular thrombus in the lateral ventricles bilaterally and third ventricle. There is interval dilatation of the lateral and third ventricles with findings of thrombus and apparent occlusion of the  cerebral aqueduct related to change in the morphology of the third ventricular thrombus. Additionally signs of increased intracranial pressure are noted with decreased sulcal markings when compared with the previous day.  12/15/2020 IMPRESSION: 1. Improved hydrocephalus. 2. Persistent intraventricular extension of hemorrhage from the right caudate head hematoma.  12/17/2020 IMPRESSION: 1. Unchanged intraparenchymal hemorrhage in the right caudate head extending into the ventricular system. 2. Unchanged mild communicating hydrocephalus.    MRI BRAIN 12/12/2020 MRA HEAD IMPRESSION: 1. Unchanged intraparenchymal hematoma of the right caudate head with intraventricular extension and mild communicating hydrocephalus. 2. Normal intracranial MRA.      ROS:   14 system review of systems performed and negative with exception of those listed in HPI  PMH:  Past Medical History:  Diagnosis Date   Arthritis    GERD (gastroesophageal reflux disease)    H/O ulcerative colitis    Hyperlipidemia    Hypertension    Inguinal hernia    Nontraumatic subcortical hemorrhage of brain (Matthew Francis)    Ulcer    ulcerative colitis   Ulcerative colitis    Wears glasses     PSH:  Past Surgical History:  Procedure Laterality Date   APPENDECTOMY     COLECTOMY     Proctocolectomy with IPAA   COLECTOMY     FOREIGN BODY REMOVAL ABDOMINAL  02/17/2012   Procedure: REMOVAL FOREIGN BODY ABDOMINAL;  Surgeon: Matthew Hollingshead, MD;  Location: [place];  Service: General;  Laterality: N/A;  abdominal wound exploration and removal of foreign body   HERNIA REPAIR     Multiple incisional hernias.   ileoanal anastomosis      Social History:  Social History   Socioeconomic History   Marital status: Single    Spouse name: Not on file   Number of children: Not on file   Years of education: Not on file   Highest education level: Not on file  Occupational History    Comment: restaurant   Tobacco Use   Smoking status: Never   Smokeless tobacco: Never  Vaping Use   Vaping Use: Never used  Substance and Sexual Activity   Alcohol use: Yes    Alcohol/week: 4.0 standard drinks    Types: 4 Cans of beer per week    Comment: 3 cans of beer per week   Drug use: No   Sexual activity: Not Currently  Other Topics Concern   Not on file  Social History Narrative   Not on file   Social Determinants of Health   Financial Resource Strain: Not on file  Food Insecurity: Not on file  Transportation Needs: Not on file  Physical Activity: Not on file  Stress: Not on file  Social Connections: Not on file  Intimate Partner Violence: Not on file    Family History:  Family History  Problem Relation Age of Onset   Cancer Sister     Medications:   Current Outpatient Medications on File Prior to Visit  Medication Sig Dispense Refill   acetaminophen (TYLENOL) 325 MG tablet Take 2 tablets (650 mg total) by mouth every 6 (six) hours as needed for moderate pain or mild pain.     amLODipine (NORVASC) 10 MG tablet Take 1 tablet (10 mg total) by mouth daily. 30 tablet 2   ascorbic acid (VITAMIN C)  500 MG tablet Take 1 tablet (500 mg total) by mouth daily. 30 tablet 0   budesonide (ENTOCORT EC) 3 MG 24 hr capsule Take 3 capsules (9 mg total) by mouth daily. 90 capsule 4   cetirizine (ZYRTEC) 10 MG tablet Take 10 mg by mouth daily as needed for allergies.      Cholecalciferol (VITAMIN D3) 1.25 MG (50000 UT) CAPS Take 50,000 Units by mouth every Wednesday. 3 capsule 0   ciprofloxacin (CIPRO) 500 MG tablet Take 500 mg by mouth 2 (two) times daily.     cloNIDine (CATAPRES) 0.1 MG tablet Take 1 tablet (0.1 mg total) by mouth 3 (three) times daily. 90 tablet 11   dicyclomine (BENTYL) 10 MG capsule Take 10 mg by mouth every 6 (six) hours as needed.     esomeprazole (NEXIUM) 20 MG capsule Take 1 capsule (20 mg total) by mouth daily. 30 capsule 4   ezetimibe (ZETIA) 10 MG tablet Take 10 mg by  mouth daily.     hydrALAZINE (APRESOLINE) 100 MG tablet Take 1 tablet (100 mg total) by mouth 3 (three) times daily. 60 tablet 4   loperamide (IMODIUM) 2 MG capsule Take 2 capsules (4 mg total) by mouth as needed for diarrhea or loose stools. 30 capsule 0   melatonin 3 MG TABS tablet Take 1 tablet (3 mg total) by mouth at bedtime. 30 tablet 0   methocarbamol (ROBAXIN) 500 MG tablet Take 1 tablet (500 mg total) by mouth every 8 (eight) hours as needed for muscle spasms. 60 tablet 4   Multiple Vitamin (MULTIVITAMIN WITH MINERALS) TABS Take 1 tablet by mouth daily.     psyllium (METAMUCIL) 58.6 % packet Take 1 packet by mouth daily as needed (For fiber/thickener).      QUEtiapine (SEROQUEL) 25 MG tablet TAKE 1 TO 2 TABLETS(25 TO 50 MG) BY MOUTH AT BEDTIME 60 tablet 1   rifaximin (XIFAXAN) 550 MG TABS tablet Take 1 tablet (550 mg total) by mouth 2 (two) times daily. 42 tablet 4   rosuvastatin (CRESTOR) 10 MG tablet Take 1 tablet (10 mg total) by mouth daily. 30 tablet 4   Turmeric (QC TUMERIC COMPLEX PO) Take 1 tablet by mouth daily as needed (For inflammatory).     vitamin B-12 (CYANOCOBALAMIN) 100 MCG tablet Take 1 tablet (100 mcg total) by mouth daily. 30 tablet 0   No current facility-administered medications on file prior to visit.    Allergies:   Allergies  Allergen Reactions   Asacol [Mesalamine] Swelling    Throat    Penicillins Anaphylaxis    Has patient had a PCN reaction causing immediate rash, facial/tongue/throat swelling, SOB or lightheadedness with hypotension: Yes Has patient had a PCN reaction causing severe rash involving mucus membranes or skin necrosis: No Has patient had a PCN reaction that required hospitalization: Yes Has patient had a PCN reaction occurring within the last 10 years: No If all of the above answers are "NO", then may proceed with Cephalosporin use.   Sulfa Antibiotics Anaphylaxis   Atorvastatin Other (See Comments)    Myalgia, muscle cramps   Bee  Pollen Hives   Bee Venom Hives and Swelling   Iodinated Contrast Media     Pt reports allergy at [facility]. Rapid heartbeat and dyspnea      OBJECTIVE:  Physical Exam  Vitals:   06/27/21 1046  BP: 133/82  Pulse: (!) 102  Weight: 221 lb (100.2 kg)  Height: 5' 10.5" (1.791 m)    Body  mass index is 31.26 kg/m. No results found.  General: well developed, well nourished, very pleasant middle-aged African-American male, seated, in no evident distress Head: head normocephalic and atraumatic.   Neck: supple with no carotid or supraclavicular bruits Cardiovascular: regular rate and rhythm, no murmurs Musculoskeletal: no deformity Skin:  no rash/petichiae Vascular:  Normal pulses all extremities   Neurologic Exam Mental Status: Awake and fully alert. Fluent speech and language. Oriented to place and time. Recent and remote memory intact. Attention span, concentration and fund of knowledge appropriate. Mood and affect appropriate.  Cranial Nerves: Pupils equal, briskly reactive to light. Extraocular movements full without nystagmus. Visual fields full to confrontation. Hearing intact. Facial sensation intact. Face, tongue, palate moves normally and symmetrically.  Motor: Normal bulk and tone. Normal strength in all tested extremity muscles.  Sensory.: intact to touch , pinprick , position and vibratory sensation. Decreased right 3-5 finger and outer hand sensation to soft touch (chronic) Coordination: Rapid alternating movements normal in all extremities.  Finger-to-nose and heel-to-shin performed accurately bilaterally. Gait and Station: Arises from chair without difficulty. Stance is normal. Gait demonstrates normal stride length and balance with out use of assistive device. Tandem walk and heel toe mild difficulty.  Reflexes: 1+ and symmetric. Toes downgoing.         ASSESSMENT: Matthew Francis is a 61 y.o. year old male with recent right carotid head ICH and IVH secondary to  hypertensive emergency, cocaine vasculopathy and small vessel disease on 12/11/2020 after presenting with headache, confusion and N/V. Vascular risk factors include HTN, HLD, DM, CKD, remote cocaine abuse and obesity.     PLAN:  Right ICH with IVH:  Recovered well without residual deficits.  Continue Crestor 20m daily and Zetia for secondary stroke prevention measures Discussed secondary stroke prevention measures and importance of close PCP follow up for aggressive stroke risk factor management. I have gone over the pathophysiology of stroke, warning signs and symptoms, risk factors and their management in some detail with instructions to go to the closest emergency room for symptoms of concern. HTN: BP goal <130/90.  Stable on current regimen per PCP HLD: LDL goal <70. Recent LDL 110 (02/2021) down from 166. Increased Crestor from 138mevery other day to daily due to continued elevated LDL as continued Zetia. Needs repeat lipid panel - currently nonfasting. Will return next week for lab work MDD: referred to behavioral health     Follow up in 6 months or call earlier if needed   CC:  PCP: HaCharline BillsMD    I spent 36 minutes of face-to-face and non-face-to-face time with patient.  This included previsit chart review, lab review, study review, order entry, electronic health record documentation, patient education regarding prior stroke including etiology, secondary stroke prevention measures and importance of managing stroke risk factors, worsening depression and answered all other questions to patient satisfaction  JeFrann RiderAGFulton County HospitalGuP[facility] [address]uCelinarJemez PuebloNC [id number] [phone] [phone]: This document was prepared with digital dictation and possible smart phrase technology. Any transcriptional errors that result from this process are unintentional.

## 2021-07-03 ENCOUNTER — Other Ambulatory Visit (INDEPENDENT_AMBULATORY_CARE_PROVIDER_SITE_OTHER): Payer: Self-pay

## 2021-07-03 DIAGNOSIS — E785 Hyperlipidemia, unspecified: Secondary | ICD-10-CM

## 2021-07-03 DIAGNOSIS — Z0289 Encounter for other administrative examinations: Secondary | ICD-10-CM

## 2021-07-04 LAB — LIPID PANEL
Chol/HDL Ratio: 2.5 ratio (ref 0.0–5.0)
Cholesterol, Total: 162 mg/dL (ref 100–199)
HDL: 66 mg/dL (ref >39)
LDL Chol Calc (NIH): 76 mg/dL (ref 0–99)
Triglycerides: 111 mg/dL (ref 0–149)
VLDL Cholesterol Cal: 20 mg/dL (ref 5–40)

## 2021-07-25 ENCOUNTER — Other Ambulatory Visit: Payer: Self-pay | Admitting: Physical Medicine & Rehabilitation

## 2021-07-25 DIAGNOSIS — G479 Sleep disorder, unspecified: Secondary | ICD-10-CM

## 2021-07-29 MED ORDER — DICYCLOMINE 10 MG CAPSULE
ORAL_CAPSULE | 2 refills | 0 days | Status: CP
Start: 2021-07-29 — End: ?

## 2021-08-12 ENCOUNTER — Other Ambulatory Visit: Payer: Self-pay | Admitting: Physical Medicine & Rehabilitation

## 2021-09-10 ENCOUNTER — Other Ambulatory Visit: Payer: Self-pay | Admitting: Physical Medicine & Rehabilitation

## 2021-09-10 DIAGNOSIS — G479 Sleep disorder, unspecified: Secondary | ICD-10-CM

## 2021-10-08 DIAGNOSIS — K580 Irritable bowel syndrome with diarrhea: Principal | ICD-10-CM

## 2021-10-08 MED ORDER — RIFAXIMIN 550 MG TABLET
ORAL_TABLET | Freq: Two times a day (BID) | ORAL | 2 refills | 30 days | Status: CP
Start: 2021-10-08 — End: ?

## 2021-10-28 ENCOUNTER — Other Ambulatory Visit: Payer: Self-pay | Admitting: Physical Medicine & Rehabilitation

## 2021-11-04 ENCOUNTER — Other Ambulatory Visit: Payer: Self-pay

## 2021-11-04 ENCOUNTER — Other Ambulatory Visit: Payer: Self-pay | Admitting: Physical Medicine & Rehabilitation

## 2021-11-04 DIAGNOSIS — G479 Sleep disorder, unspecified: Secondary | ICD-10-CM

## 2021-11-04 MED ORDER — ROSUVASTATIN CALCIUM 10 MG PO TABS: 10.0000 mg | ORAL_TABLET | Freq: Every day | ORAL | 4 refills | Status: DC

## 2021-11-05 ENCOUNTER — Ambulatory Visit: Payer: 59 | Admitting: Family Medicine

## 2021-11-12 ENCOUNTER — Ambulatory Visit (INDEPENDENT_AMBULATORY_CARE_PROVIDER_SITE_OTHER): Payer: 59 | Admitting: Family Medicine

## 2021-11-12 ENCOUNTER — Encounter: Payer: Self-pay | Admitting: Family Medicine

## 2021-11-12 VITALS — BP 134/84 | HR 92 | Temp 97.8°F | Ht 69.5 in | Wt 212.0 lb

## 2021-11-12 DIAGNOSIS — K219 Gastro-esophageal reflux disease without esophagitis: Secondary | ICD-10-CM | POA: Insufficient documentation

## 2021-11-12 DIAGNOSIS — E118 Type 2 diabetes mellitus with unspecified complications: Secondary | ICD-10-CM | POA: Insufficient documentation

## 2021-11-12 DIAGNOSIS — F1410 Cocaine abuse, uncomplicated: Secondary | ICD-10-CM

## 2021-11-12 DIAGNOSIS — E782 Mixed hyperlipidemia: Secondary | ICD-10-CM | POA: Diagnosis not present

## 2021-11-12 DIAGNOSIS — I6930 Unspecified sequelae of cerebral infarction: Secondary | ICD-10-CM

## 2021-11-12 DIAGNOSIS — M62838 Other muscle spasm: Secondary | ICD-10-CM | POA: Insufficient documentation

## 2021-11-12 DIAGNOSIS — E538 Deficiency of other specified B group vitamins: Secondary | ICD-10-CM

## 2021-11-12 DIAGNOSIS — E559 Vitamin D deficiency, unspecified: Secondary | ICD-10-CM

## 2021-11-12 DIAGNOSIS — E1122 Type 2 diabetes mellitus with diabetic chronic kidney disease: Secondary | ICD-10-CM | POA: Insufficient documentation

## 2021-11-12 DIAGNOSIS — G479 Sleep disorder, unspecified: Secondary | ICD-10-CM | POA: Diagnosis not present

## 2021-11-12 DIAGNOSIS — F419 Anxiety disorder, unspecified: Secondary | ICD-10-CM

## 2021-11-12 DIAGNOSIS — E785 Hyperlipidemia, unspecified: Secondary | ICD-10-CM | POA: Insufficient documentation

## 2021-11-12 DIAGNOSIS — M25551 Pain in right hip: Secondary | ICD-10-CM

## 2021-11-12 DIAGNOSIS — K51919 Ulcerative colitis, unspecified with unspecified complications: Secondary | ICD-10-CM

## 2021-11-12 DIAGNOSIS — I10 Essential (primary) hypertension: Secondary | ICD-10-CM

## 2021-11-12 DIAGNOSIS — F3340 Major depressive disorder, recurrent, in remission, unspecified: Secondary | ICD-10-CM

## 2021-11-12 DIAGNOSIS — F1010 Alcohol abuse, uncomplicated: Secondary | ICD-10-CM

## 2021-11-12 MED ORDER — METHOCARBAMOL 500 MG PO TABS: 1000.0000 mg | ORAL_TABLET | Freq: Three times a day (TID) | ORAL | 3 refills | Status: AC | PRN

## 2021-11-12 MED ORDER — EZETIMIBE 10 MG PO TABS: 10.0000 mg | ORAL_TABLET | Freq: Every day | ORAL | 2 refills | Status: DC

## 2021-11-12 MED ORDER — ESOMEPRAZOLE MAGNESIUM 20 MG PO CPDR: 20.0000 mg | DELAYED_RELEASE_CAPSULE | Freq: Every day | ORAL | 4 refills | Status: DC

## 2021-11-12 MED ORDER — QUETIAPINE FUMARATE 25 MG PO TABS: 50.0000 mg | ORAL_TABLET | Freq: Every day | ORAL | 2 refills | Status: DC

## 2021-11-12 MED ORDER — AMLODIPINE BESYLATE 10 MG PO TABS: 10.0000 mg | ORAL_TABLET | Freq: Every day | ORAL | 2 refills | Status: DC

## 2021-11-12 NOTE — Assessment & Plan Note (Addendum)
Minimal residual deficits with right fourth and fifth digit numbness, and some leg/back spasticity Follow [facility] Cannot take antiplatelet therapy due to bleeding risk associated with ulcer colitis Spasticity controlled with Robaxin Refilled for as needed use

## 2021-11-12 NOTE — Assessment & Plan Note (Signed)
Chronic, stable Controlled on quetiapine 50 to 100 mg nightly and OTC melatonin

## 2021-11-12 NOTE — Patient Instructions (Signed)
It was a pleasure to meet you today. We are refilling your esomeprazole, amlodipine, methocarbamol, ezetimibe, quetiapine. Follow-up in 1 week to discuss labs and further management.

## 2021-11-12 NOTE — Assessment & Plan Note (Signed)
Mild Atraumatic Able ambulate without assistance Mildly improved with Tylenol, cannot take NSAIDs Home exercises provided Follow-up 1 week

## 2021-11-12 NOTE — Assessment & Plan Note (Signed)
Likely in setting of stressful life events and substance use

## 2021-11-12 NOTE — Assessment & Plan Note (Signed)
Status post colectomy Follows [facility] No active course of bleeding Symptoms controlled with loperamide as needed, Bentyl as needed, rifaximin 550 mg twice daily

## 2021-11-12 NOTE — Assessment & Plan Note (Signed)
Lipid in 06/2021 at [facility] Continue rosuvastatin and Zetia

## 2021-11-12 NOTE — Progress Notes (Signed)
New Patient Office Visit  Subjective    Patient ID: Matthew Francis, male    DOB: 04-Aug-1960  Age: 61 y.o. MRN: [id number]  CC:  Chief Complaint  Patient presents with   Establish Care    Right hip pain x 2 days. Denies injury. Patient would like to discuss b/p and cholesterol. Will send letter for colonoscopy.     HPI [name] presents to establish care.   Patient has a  has a past medical history of Arthritis, 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 (02/09/2012), GERD (gastroesophageal reflux disease), H/O ulcerative colitis, Hyperlipidemia, Hypertension, ICH (intracerebral hemorrhage) ([place]) (12/11/2020), Inguinal hernia, Nontraumatic subcortical hemorrhage of brain ([place]), Severe sepsis ([place]) (01/17/2020), Thrombocytopenia ([place]) (01/17/2020), Ulcer, Ulcerative colitis, UTI due to extended-spectrum beta lactamase (ESBL) producing Escherichia coli (01/19/2020), and Wears glasses.  Patient has concerns about acute right hip pain occurred while walking.  This was 2 days ago.  Pain slightly improved with Tylenol.  Denies falls or trauma.  Is not worsening.  Patient cannot take ibuprofen due to history of GI bleed associate with ulcerative colitis.  Patient with extensive history of ulcerative colitis with ongoing sequelae of short gut syndrome status post colectomy.  Patient takes rifaximin, loperamide, Bentyl.  Patient follows with [facility].  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  Follow-up For neurology, reviewed notes last note was approximately 06/2021.  Patient reports that he had a very stressful past few years with inability to afford some of his ultra class medications leading to check for June.  This led to some present time.  He has been present for several years now and that he is try to work on his life.  He is working part-time now.  He has seen therapy to help him with substance use.  Patient with alcohol abuse.  Drinks 21+ drinks a week.   Feels that he is using this to cope with stressful life and will try to cut back on cocaine use.  Patient with history of hypertension.  Patient is on clonidine, hydralazine, amlodipine.  His blood pressure has been stable at home.  Patient with history of steroid-induced diabetes.  Hemoglobin A1c approximately 1 year ago from labs at [facility] showed hemoglobin of 5.7.  BMP around this time was also with normal creatinine function.   Outpatient Encounter Medications as of 11/12/2021  Medication Sig   acetaminophen (TYLENOL) 325 MG tablet Take 2 tablets (650 mg total) by mouth every 6 (six) hours as needed for moderate pain or mild pain.   ascorbic acid (VITAMIN C) 500 MG tablet Take 1 tablet (500 mg total) by mouth daily.   cetirizine (ZYRTEC) 10 MG tablet Take 10 mg by mouth daily as needed for allergies.    Cholecalciferol (VITAMIN D3) 1.25 MG (50000 UT) CAPS TAKE 1 CAPSULE BY MOUTH EVERY WEDNESDAY   cloNIDine (CATAPRES) 0.1 MG tablet Take 1 tablet (0.1 mg total) by mouth 3 (three) times daily.   dicyclomine (BENTYL) 10 MG capsule Take 10 mg by mouth every 6 (six) hours as needed.   Glucosamine 500 MG CAPS Take by mouth.   hydrALAZINE (APRESOLINE) 100 MG tablet Take 1 tablet (100 mg total) by mouth 3 (three) times daily.   loperamide (IMODIUM) 2 MG capsule Take 2 capsules (4 mg total) by mouth as needed for diarrhea or loose stools.   melatonin 3 MG TABS tablet Take 1 tablet (3 mg total) by mouth at bedtime.   Multiple  Vitamin (MULTIVITAMIN WITH MINERALS) TABS Take 1 tablet by mouth daily.   Omega-3 Fatty Acids (FISH OIL) 1000 MG CAPS Take by mouth.   psyllium (METAMUCIL) 58.6 % packet Take 1 packet by mouth daily as needed (For fiber/thickener).    rifaximin (XIFAXAN) 550 MG TABS tablet Take 1 tablet (550 mg total) by mouth 2 (two) times daily.   rosuvastatin (CRESTOR) 10 MG tablet Take 1 tablet (10 mg total) by mouth daily.   Turmeric (QC TUMERIC COMPLEX PO) Take 1 tablet by mouth daily as  needed (For inflammatory).   vitamin B-12 (CYANOCOBALAMIN) 100 MCG tablet Take 1 tablet (100 mcg total) by mouth daily.   [DISCONTINUED] acetaminophen (TYLENOL) 500 MG tablet Take by mouth.   [DISCONTINUED] amLODipine (NORVASC) 10 MG tablet Take 1 tablet (10 mg total) by mouth daily.   [DISCONTINUED] Ascorbic Acid 500 MG CHEW Chew by mouth.   [DISCONTINUED] cetirizine (ZYRTEC) 10 MG tablet Take by mouth.   [DISCONTINUED] Cholecalciferol 1.25 MG (50000 UT) capsule Take by mouth.   [DISCONTINUED] ciprofloxacin (CIPRO) 500 MG tablet Take 500 mg by mouth 2 (two) times daily.   [DISCONTINUED] dicyclomine (BENTYL) 10 MG capsule Take by mouth.   [DISCONTINUED] esomeprazole (NEXIUM) 20 MG capsule Take 1 capsule (20 mg total) by mouth daily.   [DISCONTINUED] ezetimibe (ZETIA) 10 MG tablet Take 10 mg by mouth daily.   [DISCONTINUED] hydrALAZINE (APRESOLINE) 100 MG tablet Take by mouth.   [DISCONTINUED] Melatonin 3 MG CAPS Take by mouth.   [DISCONTINUED] methocarbamol (ROBAXIN) 500 MG tablet TAKE 1 TABLET(500 MG) BY MOUTH EVERY 8 HOURS AS NEEDED FOR MUSCLE SPASMS   [DISCONTINUED] methocarbamol (ROBAXIN) 500 MG tablet Take by mouth.   [DISCONTINUED] QUEtiapine (SEROQUEL) 25 MG tablet TAKE 1 TO 2 TABLETS(25 TO 50 MG) BY MOUTH AT BEDTIME   [DISCONTINUED] rifaximin (XIFAXAN) 550 MG TABS tablet Take by mouth.   amLODipine (NORVASC) 10 MG tablet Take 1 tablet (10 mg total) by mouth daily.   esomeprazole (NEXIUM) 20 MG capsule Take 1 capsule (20 mg total) by mouth daily.   ezetimibe (ZETIA) 10 MG tablet Take 1 tablet (10 mg total) by mouth daily.   methocarbamol (ROBAXIN) 500 MG tablet Take 2 tablets (1,000 mg total) by mouth every 8 (eight) hours as needed for muscle spasms.   QUEtiapine (SEROQUEL) 25 MG tablet Take 2 tablets (50 mg total) by mouth at bedtime.   [DISCONTINUED] budesonide (ENTOCORT EC) 3 MG 24 hr capsule Take 3 capsules (9 mg total) by mouth daily.   No facility-administered encounter  medications on file as of 11/12/2021.    Past Medical History:  Diagnosis Date   Arthritis    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 02/09/2012   GERD (gastroesophageal reflux disease)    H/O ulcerative colitis    Hyperlipidemia    Hypertension    ICH (intracerebral hemorrhage) ([place]) 12/11/2020   Inguinal hernia    Nontraumatic subcortical hemorrhage of brain (HCC)    Severe sepsis ([place]) 01/17/2020   Thrombocytopenia ([place]) 01/17/2020   Ulcer    ulcerative colitis   Ulcerative colitis    UTI due to extended-spectrum beta lactamase (ESBL) producing Escherichia coli 01/19/2020   Wears glasses     Past Surgical History:  Procedure Laterality Date   APPENDECTOMY     COLECTOMY     Proctocolectomy with IPAA   COLECTOMY     FOREIGN BODY REMOVAL ABDOMINAL  02/17/2012   Procedure: REMOVAL FOREIGN BODY ABDOMINAL;  Surgeon: Odis Hollingshead, MD;  Location: [place];  Service: General;  Laterality: N/A;  abdominal wound exploration and removal of foreign body   HERNIA REPAIR     Multiple incisional hernias.   ileoanal anastomosis      Family History  Problem Relation Age of Onset   Neurodegenerative disease Mother    Cancer Father        prostate cancer   Cancer Sister    Diabetes Paternal Grandmother    Stroke Paternal Grandfather     Social History   Socioeconomic History   Marital status: Single    Spouse name: Not on file   Number of children: Not on file   Years of education: Not on file   Highest education level: Not on file  Occupational History    Comment: restaurant   Occupation: [employer]  Tobacco Use   Smoking status: Never   Smokeless tobacco: Never  Vaping Use   Vaping Use: Never used  Substance and Sexual Activity   Alcohol use: Yes    Alcohol/week: 21.0 standard drinks    Types: 21 Cans of beer per week   Drug use: Not Currently    Types: Cocaine   Sexual activity: Not Currently  Other Topics Concern   Not on file  Social  History Narrative   Not on file   Social Determinants of Health   Financial Resource Strain: Not on file  Food Insecurity: Not on file  Transportation Needs: Not on file  Physical Activity: Not on file  Stress: Not on file  Social Connections: Not on file  Intimate Partner Violence: Not on file    ROS Negative for chest pain, shortness of breath, lower extremity swelling, remainder  Objective    BP 134/84 (BP Location: Left Arm, Patient Position: Sitting, Cuff Size: Large)   Pulse 92   Temp 97.8 F (36.6 C) (Temporal)   Ht 5' 9.5" (1.765 m)   Wt 212 lb (96.2 kg)   SpO2 98%   BMI 30.86 kg/m   Gen: NAD, resting comfortably CV: RRR with no murmurs appreciated Pulm: NWOB, CTAB with no crackles, wheezes, or rhonchi GI: Normal bowel sounds present. Soft, Nontender, Nondistended. MSK: no edema, cyanosis, or clubbing noted Skin: warm, dry Neuro: grossly normal, moves all extremities Psych: Normal affect and thought content   Last CBC Lab Results  Component Value Date   WBC 5.9 12/24/2020   HGB 13.1 12/24/2020   HCT 40.6 12/24/2020   MCV 94.9 12/24/2020   MCH 30.6 12/24/2020   RDW 13.8 12/24/2020   PLT 204 56/38/7564   Last metabolic panel Lab Results  Component Value Date   GLUCOSE 120 (H) 12/26/2020   NA 137 12/26/2020   K 4.0 12/26/2020   CL 104 12/26/2020   CO2 22 12/26/2020   BUN 13 12/26/2020   CREATININE 1.27 (H) 12/26/2020   GFRNONAA >60 12/26/2020   CALCIUM 10.5 (H) 12/26/2020   PHOS 2.7 12/14/2020   PROT 6.8 12/24/2020   ALBUMIN 3.5 12/24/2020   BILITOT 0.6 12/24/2020   ALKPHOS 88 12/24/2020   AST 34 12/24/2020   ALT 79 (H) 12/24/2020   ANIONGAP 11 12/26/2020   Last lipids Lab Results  Component Value Date   CHOL 162 07/03/2021   HDL 66 07/03/2021   LDLCALC 76 07/03/2021   TRIG 111 07/03/2021   CHOLHDL 2.5 07/03/2021   Last hemoglobin A1c Lab Results  Component Value Date   HGBA1C 5.9 (H) 12/12/2020   Last thyroid functions Lab  Results  Component Value Date   TSH 0.222 (L) 12/15/2020   Last vitamin D Lab Results  Component Value Date   VD25OH 64.15 12/26/2020   Last vitamin B12 and Folate Lab Results  Component Value Date   VITAMINB12 >7,500 (H) 12/17/2020        Assessment & Plan:   Problem List Items Addressed This Visit       Cardiovascular and Mediastinum   Essential hypertension    Well-controlled on clonidine 0.1 mg 3 times daily, hydralazine 100 mg 3 times daily, amlodipine 10 mg daily Refill amlodipine 10 mg Follow-up 1 week to get restratification labs including CMP, lipid hemoglobin A1c       Relevant Medications   ezetimibe (ZETIA) 10 MG tablet   amLODipine (NORVASC) 10 MG tablet     Digestive   Ulcerative colitis ([place])    Status post colectomy Follows [facility] No active course of bleeding Symptoms controlled with loperamide as needed, Bentyl as needed, rifaximin 550 mg twice daily       Gastroesophageal reflux disease    Multiple current GI issues  Follow-up with [facility] Controlled on esomeprazole 20 mg daily Refilled for 6 months        Relevant Medications   esomeprazole (NEXIUM) 20 MG capsule     Endocrine   Controlled type 2 diabetes mellitus with complication, without long-term current use of insulin (HCC)     Other   Sleep disorder    Chronic, stable Controlled on quetiapine 50 to 100 mg nightly and OTC melatonin       Relevant Medications   QUEtiapine (SEROQUEL) 25 MG tablet   Mixed hyperlipidemia - Primary    Lipid in 06/2021 at [facility] Continue rosuvastatin and Zetia       Relevant Medications   ezetimibe (ZETIA) 10 MG tablet   amLODipine (NORVASC) 10 MG tablet   History of hemorrhagic cerebrovascular accident (CVA) with residual deficit    Minimal residual deficits with right fourth and fifth digit numbness, and some leg/back spasticity Follow [place] [facility] Cannot take antiplatelet therapy due to bleeding risk associated with ulcer  colitis Spasticity controlled with Robaxin Refilled for as needed use       Muscle spasm   Relevant Medications   methocarbamol (ROBAXIN) 500 MG tablet   Acute right hip pain    Mild Atraumatic Able ambulate without assistance Mildly improved with Tylenol, cannot take NSAIDs Home exercises provided Follow-up 1 week       Cocaine abuse ([place])    Currently sees therapy to help with cessation Because of hemorrhagic stroke approximately a year ago. Recommend cessation       Alcohol abuse    Currently taking 21 21/week History of substance use with cocaine as well Currently seeing therapy Recommend cessation       Recurrent major depressive disorder, in remission ([place])    Mildly elevated PHQ-9 Follows with therapy No SI/HI Likely a mix of substance use and stressful life situation       Anxiety    Likely in setting of stressful life events and substance use       Vitamin D deficiency    On supplementation Continue monitoring       B12 deficiency    On supplementation Continue to monitor with PPI therapy        Return in about 1 week (around 11/19/2021) for hip pain, labs.   Bonnita Hollow, MD  During today's patient visit, I dedicated 80  minutes of face-to-face time to discuss the patient's acute and chronic health conditions as listed.    There are other unrelated non-urgent complaints, but due to the busy schedule and the amount of time I've already spent with him, time does not permit me to address these routine issues at today's visit. I've requested another appointment to review these additional issues.

## 2021-11-12 NOTE — Assessment & Plan Note (Signed)
Currently taking 21 21/week History of substance use with cocaine as well Currently seeing therapy Recommend cessation

## 2021-11-12 NOTE — Assessment & Plan Note (Signed)
On supplementation Continue monitoring

## 2021-11-12 NOTE — Assessment & Plan Note (Addendum)
Mildly elevated PHQ-9 Follows with therapy No SI/HI Likely a mix of substance use and stressful life situation

## 2021-11-12 NOTE — Assessment & Plan Note (Signed)
On supplementation Continue to monitor with PPI therapy

## 2021-11-12 NOTE — Assessment & Plan Note (Signed)
Well-controlled on clonidine 0.1 mg 3 times daily, hydralazine 100 mg 3 times daily, amlodipine 10 mg daily Refill amlodipine 10 mg Follow-up 1 week to get restratification labs including CMP, lipid hemoglobin A1c

## 2021-11-12 NOTE — Assessment & Plan Note (Signed)
Multiple current GI issues  Follow-up with [facility] Controlled on esomeprazole 20 mg daily Refilled for 6 months

## 2021-11-12 NOTE — Assessment & Plan Note (Addendum)
Currently sees therapy to help with cessation Because of hemorrhagic stroke approximately a year ago. Recommend cessation

## 2021-11-21 ENCOUNTER — Ambulatory Visit (INDEPENDENT_AMBULATORY_CARE_PROVIDER_SITE_OTHER): Payer: 59 | Admitting: Family Medicine

## 2021-11-21 ENCOUNTER — Encounter: Payer: Self-pay | Admitting: Family Medicine

## 2021-11-21 ENCOUNTER — Other Ambulatory Visit: Payer: Self-pay | Admitting: Family Medicine

## 2021-11-21 VITALS — BP 113/76 | HR 88 | Temp 97.1°F | Ht 69.0 in | Wt 214.4 lb

## 2021-11-21 DIAGNOSIS — K51919 Ulcerative colitis, unspecified with unspecified complications: Secondary | ICD-10-CM | POA: Diagnosis not present

## 2021-11-21 DIAGNOSIS — E559 Vitamin D deficiency, unspecified: Secondary | ICD-10-CM

## 2021-11-21 DIAGNOSIS — I10 Essential (primary) hypertension: Secondary | ICD-10-CM

## 2021-11-21 DIAGNOSIS — K219 Gastro-esophageal reflux disease without esophagitis: Secondary | ICD-10-CM

## 2021-11-21 DIAGNOSIS — E538 Deficiency of other specified B group vitamins: Secondary | ICD-10-CM | POA: Diagnosis not present

## 2021-11-21 DIAGNOSIS — E118 Type 2 diabetes mellitus with unspecified complications: Secondary | ICD-10-CM

## 2021-11-21 DIAGNOSIS — F419 Anxiety disorder, unspecified: Secondary | ICD-10-CM

## 2021-11-21 MED ORDER — PANTOPRAZOLE SODIUM 40 MG PO TBEC: 40.0000 mg | DELAYED_RELEASE_TABLET | Freq: Every day | ORAL | 3 refills | Status: DC

## 2021-11-21 NOTE — Patient Instructions (Signed)
We are checking blood work as ordered. We are ordering Protonix for acid reflux.

## 2021-11-21 NOTE — Progress Notes (Signed)
   Matthew Francis is a 61 y.o. male who presents today for an office visit.  Assessment/Plan:  New/Acute Problems:   Chronic Problems Addressed Today: Essential hypertension Well-controlled current regimen Check CMP  Gastroesophageal reflux disease Symptoms are controlled on omeprazole 20 mg DC omeprazole Start Protonix 40 mg daily    Subjective:  HPI:  Patient here for follow-up on blood work.  Also says that insurance is not covering his omeprazole and that omeprazole 20 is not controlling symptoms.  He is interested in trying Protonix as this is helped in the past.  History of chronic hypertension, blood pressure at goal today. Patient denies any symptoms of chest pain or shortness of breath.  Patient has history of steroid-induced diabetes, blood sugars been well controlled in the past after stopping steroids.  Denies symptoms of polyuria polydipsia.  Patient has history of hyperlipidemia.    Patient is a history of UC.  He has noted some spotting of blood in his stool.,  He has had anemia in the past, denies shortness of breath.      Objective:  Physical Exam: BP 113/76 (BP Location: Left Arm, Patient Position: Sitting, Cuff Size: Normal)   Pulse 88   Temp (!) 97.1 F (36.2 C) (Temporal)   Ht 5' 9"  (1.753 m)   Wt 214 lb 6.4 oz (97.3 kg)   SpO2 98%   BMI 31.66 kg/m   Gen: No acute distress, resting comfortably CV: Regular rate and rhythm with no murmurs appreciated, no swelling Pulm: Normal work of breathing, clear to auscultation bilaterally with no crackles, wheezes, or rhonchi Neuro: Grossly normal, moves all extremities Psych: Normal affect and thought content      Matthew Banda, MD, MS

## 2021-11-21 NOTE — Assessment & Plan Note (Signed)
Well-controlled current regimen Check CMP

## 2021-11-21 NOTE — Assessment & Plan Note (Signed)
Well-controlled status post DC steroids Check hemoglobin A1c

## 2021-11-21 NOTE — Assessment & Plan Note (Signed)
Normal about 1 year ago On supplementation Recheck

## 2021-11-21 NOTE — Assessment & Plan Note (Signed)
Follows with GI Mild spotting on wiping toilet paper History of anemia with GI bleed We will check CBC

## 2021-11-21 NOTE — Assessment & Plan Note (Signed)
Symptoms are controlled on omeprazole 20 mg DC omeprazole Start Protonix 40 mg daily

## 2021-11-21 NOTE — Assessment & Plan Note (Signed)
Normal supplementation about 1 year ago Recheck

## 2021-11-22 ENCOUNTER — Other Ambulatory Visit: Payer: Self-pay | Admitting: Family Medicine

## 2021-11-22 DIAGNOSIS — D649 Anemia, unspecified: Secondary | ICD-10-CM

## 2021-11-22 LAB — COMPREHENSIVE METABOLIC PANEL
ALT: 42 U/L (ref 0–53)
AST: 29 U/L (ref 0–37)
Albumin: 4.2 g/dL (ref 3.5–5.2)
Alkaline Phosphatase: 71 U/L (ref 39–117)
BUN: 16 mg/dL (ref 6–23)
CO2: 20 mEq/L (ref 19–32)
Calcium: 9.7 mg/dL (ref 8.4–10.5)
Chloride: 109 mEq/L (ref 96–112)
Creatinine, Ser: 1.42 mg/dL (ref 0.40–1.50)
GFR: 54.43 mL/min — ABNORMAL LOW (ref >60.00)
Glucose, Bld: 107 mg/dL — ABNORMAL HIGH (ref 70–99)
Potassium: 4.1 mEq/L (ref 3.5–5.1)
Sodium: 139 mEq/L (ref 135–145)
Total Bilirubin: 0.5 mg/dL (ref 0.2–1.2)
Total Protein: 7.2 g/dL (ref 6.0–8.3)

## 2021-11-22 LAB — CBC WITH DIFFERENTIAL/PLATELET
Basophils Absolute: 0 10*3/uL (ref 0.0–0.1)
Basophils Relative: 0.6 % (ref 0.0–3.0)
Eosinophils Absolute: 0.4 10*3/uL (ref 0.0–0.7)
Eosinophils Relative: 7 % — ABNORMAL HIGH (ref 0.0–5.0)
HCT: 37.8 % — ABNORMAL LOW (ref 39.0–52.0)
Hemoglobin: 12.6 g/dL — ABNORMAL LOW (ref 13.0–17.0)
Lymphocytes Relative: 25.4 % (ref 12.0–46.0)
Lymphs Abs: 1.4 10*3/uL (ref 0.7–4.0)
MCHC: 33.2 g/dL (ref 30.0–36.0)
MCV: 90.5 fl (ref 78.0–100.0)
Monocytes Absolute: 0.4 10*3/uL (ref 0.1–1.0)
Monocytes Relative: 7.5 % (ref 3.0–12.0)
Neutro Abs: 3.3 10*3/uL (ref 1.4–7.7)
Neutrophils Relative %: 59.5 % (ref 43.0–77.0)
Platelets: 188 10*3/uL (ref 150.0–400.0)
RBC: 4.18 Mil/uL — ABNORMAL LOW (ref 4.22–5.81)
RDW: 15.4 % (ref 11.5–15.5)
WBC: 5.5 10*3/uL (ref 4.0–10.5)

## 2021-11-22 LAB — B12 AND FOLATE PANEL
Folate: 9.3 ng/mL (ref >5.9)
Vitamin B-12: >1504 pg/mL — ABNORMAL HIGH (ref 211–911)

## 2021-11-25 LAB — VITAMIN D 1,25 DIHYDROXY
Vitamin D 1, 25 (OH)2 Total: 33 pg/mL (ref 18–72)
Vitamin D2 1, 25 (OH)2: <8 pg/mL
Vitamin D3 1, 25 (OH)2: 33 pg/mL

## 2021-12-23 ENCOUNTER — Other Ambulatory Visit: Payer: Self-pay

## 2021-12-25 ENCOUNTER — Ambulatory Visit: Payer: 59 | Admitting: Physical Medicine & Rehabilitation

## 2021-12-26 ENCOUNTER — Ambulatory Visit (INDEPENDENT_AMBULATORY_CARE_PROVIDER_SITE_OTHER): Payer: 59 | Admitting: Adult Health

## 2021-12-26 ENCOUNTER — Encounter: Payer: Self-pay | Admitting: Adult Health

## 2021-12-26 ENCOUNTER — Ambulatory Visit: Payer: 59 | Admitting: Family Medicine

## 2021-12-26 VITALS — BP 137/86 | HR 95 | Ht 71.0 in | Wt 214.1 lb

## 2021-12-26 DIAGNOSIS — I610 Nontraumatic intracerebral hemorrhage in hemisphere, subcortical: Secondary | ICD-10-CM

## 2021-12-26 DIAGNOSIS — F1990 Other psychoactive substance use, unspecified, uncomplicated: Secondary | ICD-10-CM | POA: Diagnosis not present

## 2021-12-26 DIAGNOSIS — E785 Hyperlipidemia, unspecified: Secondary | ICD-10-CM | POA: Diagnosis not present

## 2021-12-26 NOTE — Patient Instructions (Addendum)
Continue Crestor and Zetia  for secondary stroke prevention  Continue working on cessation of all substances as continued use greatly increases your risk of additional strokes  We will check cholesterol levels today - I will let you know if we are able to make any changes after I view the results  Continue to follow up with PCP regarding cholesterol and blood pressure management  Maintain strict control of hypertension with blood pressure goal below 130/90and cholesterol with LDL cholesterol (bad cholesterol) goal below 70 mg/dL.   Signs of a Stroke? Follow the BEFAST method:  Balance Watch for a sudden loss of balance, trouble with coordination or vertigo Eyes Is there a sudden loss of vision in one or both eyes? Or double vision?  Face: Ask the person to smile. Does one side of the face droop or is it numb?  Arms: Ask the person to raise both arms. Does one arm drift downward? Is there weakness or numbness of a leg? Speech: Ask the person to repeat a simple phrase. Does the speech sound slurred/strange? Is the person confused ? Time: If you observe any of these signs, call [phone].       Thank you for coming to see [place] at [facility]. I hope we have been able to provide you high quality care today.  You may receive a patient satisfaction survey over the next few weeks. We would appreciate your feedback and comments so that we may continue to improve ourselves and the health of our patients.

## 2021-12-26 NOTE — Progress Notes (Signed)
[facility] [address] [place]. [place] [address] [phone]       STROKE FOLLOW UP NOTE  [name] Date of Birth:  [birth year] Medical Record Number:  [id number]   Reason for Referral: stroke follow up   SUBJECTIVE:   CHIEF COMPLAINT:  Chief Complaint  Patient presents with   Follow-up    Pt reports he's not getting enough sleep, some depression but overall okay.  Room 2 alone.      HPI:   Update 12/26/2021 JM: Patient returns for 59-monthstroke follow-up unaccompanied.  Overall stable without new stroke/TIA symptoms.  Reports residual occasional mild imbalance but greatly improved since prior visit. Continued HEP routinely.  Ambulates without assistive device, denies any recent falls.  Continues to work as well as maintain ADLs and IADLs independently. C/o difficulty sleeping over the past 5 months.  Currently following with therapist for underlying depression and substance abuse. Reports depression has improved some. Currently, approx 3 beers/night and cocaine use 3-4x/week (previously using daily and larger amt). His goal is complete cessation.  Denies tobacco use.  Remains on Crestor and Zetia, questions occasional upper extremity myalgias on daily Crestor dosage but continues to take as prescribed. Blood pressure today 137/86, monitors at home and typically 110-120s/70s.  No further concerns at this time.    History provided for reference purposes only Update 06/27/2021 JM: Returns for 421-monthtroke follow-up unaccompanied.  Overall stable without new stroke/TIA symptoms.  Reports improvement of left hand weakness. Report chronic right hand 3-5th digit finger numbness - followed by ortho.  Completed therapies back in September. Continues to work without great difficulty.  On seroquel and melatonin for mood/sleep per PMR Dr. SwNaaman PlummerReports increasing depression - interested in seeing behavioral health. Denies SI/HI. Remains on Crestor and Zetia  without side effects.  Blood pressure today 133/82.  Glucose levels stable. No further concerns at this time.   Initial visit 02/14/2021 JM: Mr. BaBurchereturns for hospital follow up unaccompanied. Overall doing well. Continues to work with therapies with ongoing improvement for residual mild short term memory difficulties, left hand 4-5th digit fingertip numbness and slight decrease left hand dexterity.  Reports ongoing great improvement.  He has since returned back to work at a [employer] difficulty.  Denies new stroke/TIA symptoms.  Remains on Crestor 10 mg every other day (dose not adjusted at d/c) as well as Zetia.  Blood pressure today 128/79.  Stable at home.  Routine monitoring glucose levels which have been stable.  Reports modifying diet and overall making better health choices.  Denies any additional drug use.  No further concerns at this time.  Stroke admission 12/11/2020 Mr. DyRENNY REMERs a [age].o. male with history of ulcerative colitis, HTN, HLD, DM, CKD, lone A. fib admitted on 12/11/2020 for headache, confusion, nausea vomiting.  Personally reviewed hospitalization from progress notes, lab work and imaging.  Evaluated by Dr. 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  CT head showed IPH right lateral ventricle, third ventricle and partial fourth ventricle.  Overnight 7/1, increased confusion with stat CT stable hemorrhage and mild hydrocephalus.  Neurosurgery eval no intervention required with repeat imaging 7/2 improved hydrocephalus.  EEG unremarkable.  MRA unremarkable without aneurysm or AVM identified.  Carotid Dopplers unremarkable.  EF 60 to 65%.  LDL 166 -increase home dose Crestor from 10 mg to 20 mg daily and ongoing use of Zetia.  A1c 5.9.  UDS positive for cocaine.  No prior stroke history.  Hospital course complicated by ulcerative colitis, AKI, c/o severe lower back spasms with  radiculopathy and bouts of delirium with agitation and combativeness placed on Risperdal.  Discharge to CIR on 7/8 per therapy recommendations and discharged home on 01/01/2021.      PERTINENT IMAGING  CT HEAD   12/11/2020 IMPRESSION: 1. Acute hemorrhage within the right caudate/basal ganglia with hemorrhage in the right lateral, third, and fourth ventricles. No midline shift. The ventricles are slightly dilated as compared with prior CT from 2021. 2. Mild chronic small vessel ischemic change of the white matter.  12/13/2020 IMPRESSION: Stable right basal ganglia hemorrhage with intraventricular extension. No change since prior study.  12/14/2020 IMPRESSION: Persistent hemorrhage in the head of the caudate nucleus on the right with evidence of interventricular thrombus in the lateral ventricles bilaterally and third ventricle. There is interval dilatation of the lateral and third ventricles with findings of thrombus and apparent occlusion of the cerebral aqueduct related to change in the morphology of the third ventricular thrombus. Additionally signs of increased intracranial pressure are noted with decreased sulcal markings when compared with the previous day.  12/15/2020 IMPRESSION: 1. Improved hydrocephalus. 2. Persistent intraventricular extension of hemorrhage from the right caudate head hematoma.  12/17/2020 IMPRESSION: 1. Unchanged intraparenchymal hemorrhage in the right caudate head extending into the ventricular system. 2. Unchanged mild communicating hydrocephalus.    MRI BRAIN 12/12/2020 MRA HEAD IMPRESSION: 1. Unchanged intraparenchymal hematoma of the right caudate head with intraventricular extension and mild communicating hydrocephalus. 2. Normal intracranial MRA.      ROS:   14 system review of systems performed and negative with exception of those listed in HPI  PMH:  Past Medical History:  Diagnosis Date   Arthritis    Foreign body (FB) in  soft tissue-RLQ abdominal wall 02/09/2012   GERD (gastroesophageal reflux disease)    H/O ulcerative colitis    Hyperlipidemia    Hypertension    ICH (intracerebral hemorrhage) ([facility]) 12/11/2020   Inguinal hernia    Nontraumatic subcortical hemorrhage of brain (HCC)    Severe sepsis ([place]) 01/17/2020   Thrombocytopenia ([place]) 01/17/2020   Ulcer    ulcerative colitis   Ulcerative colitis    UTI due to extended-spectrum beta lactamase (ESBL) producing Escherichia coli 01/19/2020   Wears glasses     PSH:  Past Surgical History:  Procedure Laterality Date   APPENDECTOMY     COLECTOMY     Proctocolectomy with IPAA   COLECTOMY     FOREIGN BODY REMOVAL ABDOMINAL  02/17/2012   Procedure: REMOVAL FOREIGN BODY ABDOMINAL;  Surgeon: Odis Hollingshead, MD;  Location: [facility];  Service: General;  Laterality: N/A;  abdominal wound exploration and removal of foreign body   HERNIA REPAIR     Multiple incisional hernias.   ileoanal anastomosis      Social History:  Social History   Socioeconomic History   Marital status: Single    Spouse name: Not on file   Number of children: Not on file   Years of education: Not on file   Highest education level: Not on file  Occupational History    Comment: restaurant   Occupation: [employer]  Tobacco Use   Smoking status: Never   Smokeless tobacco: Never  Vaping Use   Vaping Use: Never used  Substance and Sexual Activity   Alcohol use: Yes    Alcohol/week: 21.0 standard drinks of alcohol  Types: 21 Cans of beer per week   Drug use: Not Currently    Types: Cocaine   Sexual activity: Not Currently  Other Topics Concern   Not on file  Social History Narrative   Not on file   Social Determinants of Health   Financial Resource Strain: Not on file  Food Insecurity: Not on file  Transportation Needs: Not on file  Physical Activity: Not on file  Stress: Not on file  Social Connections: Not on file  Intimate Partner Violence: Not  on file    Family History:  Family History  Problem Relation Age of Onset   Neurodegenerative disease Mother    Cancer Father        prostate cancer   Cancer Sister    Diabetes Paternal Grandmother    Stroke Paternal Grandfather     Medications:   Current Outpatient Medications on File Prior to Visit  Medication Sig Dispense Refill   acetaminophen (TYLENOL) 325 MG tablet Take 2 tablets (650 mg total) by mouth every 6 (six) hours as needed for moderate pain or mild pain.     amLODipine (NORVASC) 10 MG tablet Take 1 tablet (10 mg total) by mouth daily. 30 tablet 2   ascorbic acid (VITAMIN C) 500 MG tablet Take 1 tablet (500 mg total) by mouth daily. 30 tablet 0   cetirizine (ZYRTEC) 10 MG tablet Take 10 mg by mouth daily as needed for allergies.      Cholecalciferol (VITAMIN D3) 1.25 MG (50000 UT) CAPS TAKE 1 CAPSULE BY MOUTH EVERY WEDNESDAY 4 capsule 5   cloNIDine (CATAPRES) 0.1 MG tablet Take 1 tablet (0.1 mg total) by mouth 3 (three) times daily. 90 tablet 11   dicyclomine (BENTYL) 10 MG capsule Take 10 mg by mouth every 6 (six) hours as needed.     ezetimibe (ZETIA) 10 MG tablet Take 1 tablet (10 mg total) by mouth daily. 30 tablet 2   Glucosamine 500 MG CAPS Take by mouth.     hydrALAZINE (APRESOLINE) 100 MG tablet Take 1 tablet (100 mg total) by mouth 3 (three) times daily. 60 tablet 4   loperamide (IMODIUM) 2 MG capsule Take 2 capsules (4 mg total) by mouth as needed for diarrhea or loose stools. 30 capsule 0   melatonin 3 MG TABS tablet Take 1 tablet (3 mg total) by mouth at bedtime. 30 tablet 0   Multiple Vitamin (MULTIVITAMIN WITH MINERALS) TABS Take 1 tablet by mouth daily.     Omega-3 Fatty Acids (FISH OIL) 1000 MG CAPS Take by mouth.     pantoprazole (PROTONIX) 40 MG tablet Take 1 tablet (40 mg total) by mouth daily. 30 tablet 3   psyllium (METAMUCIL) 58.6 % packet Take 1 packet by mouth daily as needed (For fiber/thickener).      QUEtiapine (SEROQUEL) 25 MG tablet Take 2  tablets (50 mg total) by mouth at bedtime. 60 tablet 2   rifaximin (XIFAXAN) 550 MG TABS tablet Take 1 tablet (550 mg total) by mouth 2 (two) times daily. 42 tablet 4   rosuvastatin (CRESTOR) 10 MG tablet Take 1 tablet (10 mg total) by mouth daily. 30 tablet 4   Turmeric (QC TUMERIC COMPLEX PO) Take 1 tablet by mouth daily as needed (For inflammatory).     vitamin B-12 (CYANOCOBALAMIN) 100 MCG tablet Take 1 tablet (100 mcg total) by mouth daily. 30 tablet 0   No current facility-administered medications on file prior to visit.    Allergies:   Allergies  Allergen Reactions   Asacol [Mesalamine] Swelling    Throat    Penicillins Anaphylaxis    Has patient had a PCN reaction causing immediate rash, facial/tongue/throat swelling, SOB or lightheadedness with hypotension: Yes Has patient had a PCN reaction causing severe rash involving mucus membranes or skin necrosis: No Has patient had a PCN reaction that required hospitalization: Yes Has patient had a PCN reaction occurring within the last 10 years: No If all of the above answers are "NO", then may proceed with Cephalosporin use.   Sulfa Antibiotics Anaphylaxis   Atorvastatin Other (See Comments)    Myalgia, muscle cramps   Bee Pollen Hives   Bee Venom Hives and Swelling   Iodinated Contrast Media     Pt reports allergy at [place]. Rapid heartbeat and dyspnea      OBJECTIVE:  Physical Exam  Vitals:   12/26/21 1258  BP: 137/86  Pulse: 95  Weight: 214 lb 2 oz (97.1 kg)  Height: 5' 11"  (1.803 m)   Body mass index is 29.86 kg/m. No results found.   General: well developed, well nourished, very pleasant middle-aged African-American male, seated, in no evident distress Head: head normocephalic and atraumatic.   Neck: supple with no carotid or supraclavicular bruits Cardiovascular: regular rate and rhythm, no murmurs Musculoskeletal: no deformity Skin:  no rash/petichiae Vascular:  Normal pulses all extremities    Neurologic Exam Mental Status: Awake and fully alert. Fluent speech and language. Oriented to place and time. Recent and remote memory intact. Attention span, concentration and fund of knowledge appropriate. Mood and affect appropriate.  Cranial Nerves: Pupils equal, briskly reactive to light. Extraocular movements full without nystagmus. Visual fields full to confrontation. Hearing intact. Facial sensation intact. Face, tongue, palate moves normally and symmetrically.  Motor: Normal bulk and tone. Normal strength in all tested extremity muscles.  Sensory.: intact to touch , pinprick , position and vibratory sensation. Decreased right 3-5 finger and outer hand sensation to soft touch (chronic) Coordination: Rapid alternating movements normal in all extremities.  Finger-to-nose and heel-to-shin performed accurately bilaterally. Gait and Station: Arises from chair without difficulty. Stance is normal. Gait demonstrates normal stride length and balance with out use of assistive device. Tandem walk and heel toe mild difficulty.  Reflexes: 1+ and symmetric. Toes downgoing.         ASSESSMENT: KEIANDRE CYGAN is a 62 y.o. 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 Vascular risk factors include HTN, HLD, DM, CKD, cocaine and EtOH abuse and obesity.     PLAN:  Right ICH with IVH:  Recovered well without residual deficits.  Continue Crestor 37m daily and Zetia for secondary stroke prevention measures - request ongoing refills by PCP as these will be recommended lifelong unless contraindicated in the future Repeat lipid panel today - may adjust Crestor dose based on level Discussed secondary stroke prevention measures and importance of close PCP follow up for aggressive stroke risk factor management including BP goal<130/90 and HLD with LDL  goal<70 I have gone over the pathophysiology of stroke, warning signs and symptoms, risk factors and their management in some detail with instructions to go to the closest emergency room for symptoms of concern. Substance abuse: Discussed importance of complete cessation as continued use greatly increases risk of additional strokes and multiple other health concerns.  Currently working with therapist in hopes of complete  cessation.  He understands risks of continued use.     Doing well from stroke standpoint without further recommendations and risk factors are managed by PCP. He may follow up PRN, as usual for our patients who are strictly being followed for stroke. If any new neurological issues should arise, request PCP place referral for evaluation by one of our neurologists. Thank you.     CC:  PCP: Bonnita Hollow, MD    I spent 32 minutes of face-to-face and non-face-to-face time with patient.  This included previsit chart review, lab review, study review, order entry, electronic health record documentation, patient education regarding prior stroke, secondary stroke prevention measures and importance of managing stroke risk factors, other concerns as mentioned above and answered all other questions to patient satisfaction  Frann Rider, AGNP-BC  [facility] [address] [place], [place] [address]  Phone [phone] Fax [phone] Note: This document was prepared with digital dictation and possible smart phrase technology. Any transcriptional errors that result from this process are unintentional.

## 2021-12-27 LAB — LIPID PANEL
Chol/HDL Ratio: 2.2 ratio (ref 0.0–5.0)
Cholesterol, Total: 142 mg/dL (ref 100–199)
HDL: 66 mg/dL (ref >39)
LDL Chol Calc (NIH): 58 mg/dL (ref 0–99)
Triglycerides: 100 mg/dL (ref 0–149)
VLDL Cholesterol Cal: 18 mg/dL (ref 5–40)

## 2021-12-30 ENCOUNTER — Other Ambulatory Visit: Payer: Self-pay | Admitting: Adult Health

## 2021-12-30 MED ORDER — ROSUVASTATIN CALCIUM 5 MG PO TABS
5.0000 mg | ORAL_TABLET | Freq: Every day | ORAL | 5 refills | Status: DC
Start: 2021-12-30 — End: 2022-01-03

## 2021-12-31 ENCOUNTER — Ambulatory Visit: Payer: 59 | Admitting: Family Medicine

## 2022-01-03 ENCOUNTER — Encounter: Payer: Self-pay | Admitting: Family Medicine

## 2022-01-03 ENCOUNTER — Ambulatory Visit (INDEPENDENT_AMBULATORY_CARE_PROVIDER_SITE_OTHER): Payer: 59 | Admitting: Family Medicine

## 2022-01-03 VITALS — BP 138/86 | HR 80 | Temp 97.4°F | Wt 215.0 lb

## 2022-01-03 DIAGNOSIS — D649 Anemia, unspecified: Secondary | ICD-10-CM | POA: Diagnosis not present

## 2022-01-03 DIAGNOSIS — E785 Hyperlipidemia, unspecified: Secondary | ICD-10-CM

## 2022-01-03 DIAGNOSIS — K51919 Ulcerative colitis, unspecified with unspecified complications: Secondary | ICD-10-CM

## 2022-01-03 DIAGNOSIS — E118 Type 2 diabetes mellitus with unspecified complications: Secondary | ICD-10-CM | POA: Diagnosis not present

## 2022-01-03 DIAGNOSIS — M7712 Lateral epicondylitis, left elbow: Secondary | ICD-10-CM | POA: Diagnosis not present

## 2022-01-03 DIAGNOSIS — I10 Essential (primary) hypertension: Secondary | ICD-10-CM

## 2022-01-03 LAB — CBC WITH DIFFERENTIAL/PLATELET
Basophils Absolute: 0 10*3/uL (ref 0.0–0.1)
Basophils Relative: 0.3 % (ref 0.0–3.0)
Eosinophils Absolute: 0.3 10*3/uL (ref 0.0–0.7)
Eosinophils Relative: 4.6 % (ref 0.0–5.0)
HCT: 38.5 % — ABNORMAL LOW (ref 39.0–52.0)
Hemoglobin: 12.4 g/dL — ABNORMAL LOW (ref 13.0–17.0)
Lymphocytes Relative: 26.6 % (ref 12.0–46.0)
Lymphs Abs: 1.7 10*3/uL (ref 0.7–4.0)
MCHC: 32.1 g/dL (ref 30.0–36.0)
MCV: 91.8 fl (ref 78.0–100.0)
Monocytes Absolute: 0.6 10*3/uL (ref 0.1–1.0)
Monocytes Relative: 9.2 % (ref 3.0–12.0)
Neutro Abs: 3.7 10*3/uL (ref 1.4–7.7)
Neutrophils Relative %: 59.3 % (ref 43.0–77.0)
Platelets: 192 10*3/uL (ref 150.0–400.0)
RBC: 4.19 Mil/uL — ABNORMAL LOW (ref 4.22–5.81)
RDW: 15.3 % (ref 11.5–15.5)
WBC: 6.3 10*3/uL (ref 4.0–10.5)

## 2022-01-03 LAB — HEMOGLOBIN A1C: Hgb A1c MFr Bld: 6.6 % — ABNORMAL HIGH (ref 4.6–6.5)

## 2022-01-03 MED ORDER — DICLOFENAC SODIUM 1 % EX GEL: 4.0000 g | Freq: Four times a day (QID) | CUTANEOUS | 3 refills | Status: AC | PRN

## 2022-01-03 MED ORDER — AMLODIPINE BESYLATE 10 MG PO TABS: 10.0000 mg | ORAL_TABLET | Freq: Every day | ORAL | 2 refills | Status: DC

## 2022-01-03 MED ORDER — ROSUVASTATIN CALCIUM 5 MG PO TABS
5.0000 mg | ORAL_TABLET | Freq: Every day | ORAL | 5 refills | Status: DC
Start: 2022-01-03 — End: 2023-11-19

## 2022-01-03 NOTE — Assessment & Plan Note (Signed)
Recent lipid panel normal Continue rosuvastatin 5 mg and Zetia 10 mg Refilled rosuvastatin

## 2022-01-03 NOTE — Progress Notes (Signed)
Assessment/Plan:   Problem List Items Addressed This Visit       Cardiovascular and Mediastinum   Essential hypertension    Well-controlled on amlodipine 10 mg and clonidine 0.1 mg 3 times daily Refill amlodipine Follow-up 6 months      Relevant Medications   amLODipine (NORVASC) 10 MG tablet   rosuvastatin (CRESTOR) 5 MG tablet     Digestive   Ulcerative colitis (HCC)    Likely cause abdominal pain and GI bleeding Has upcoming follow-up with GI        Endocrine   Controlled type 2 diabetes mellitus with complication, without long-term current use of insulin (HCC)    Asymptomatic Diet controlled Recheck hemoglobin A1c Referral to ophthalmology for eye exam       Relevant Medications   rosuvastatin (CRESTOR) 5 MG tablet   Other Relevant Orders   Ambulatory referral to Ophthalmology     Musculoskeletal and Integument   Lateral epicondylitis of left elbow   Relevant Medications   diclofenac Sodium (VOLTAREN) 1 % GEL     Other   Hyperlipidemia    Recent lipid panel normal Continue rosuvastatin 5 mg and Zetia 10 mg Refilled rosuvastatin      Relevant Medications   amLODipine (NORVASC) 10 MG tablet   rosuvastatin (CRESTOR) 5 MG tablet   Anemia - Primary    Likely associated with GI bleeding from ulcerative colitis Has follow-up with GI CBC in 11/2021, mild anemia Recent B12 normal CBC and iron panel      Relevant Orders   Hemoglobin A1C   CBC w/Diff   Iron, TIBC and Ferritin Panel       Subjective:  HPI:  Matthew Francis is a 61 y.o. male who has Essential hypertension; Ulcerative colitis ([place]); Sleep disorder; Hyperlipidemia; History of hemorrhagic cerebrovascular accident (CVA) with residual deficit; Muscle spasm; Gastroesophageal reflux disease; Acute right hip pain; Controlled type 2 diabetes mellitus with complication, without long-term current use of insulin ([place]); Cocaine abuse ([place]); Alcohol abuse; Recurrent major depressive disorder, in  remission ([place]); Anxiety; Vitamin D deficiency; B12 deficiency; Anemia; and Lateral epicondylitis of left elbow on their problem list..   Matthew Francis  has a past medical history of Arthritis, 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 (02/09/2012), GERD (gastroesophageal reflux disease), H/O ulcerative colitis, Hyperlipidemia, Hypertension, ICH (intracerebral hemorrhage) ([place]) (12/11/2020), Inguinal hernia, Nontraumatic subcortical hemorrhage of brain ([place]), Severe sepsis ([place]) (01/17/2020), Thrombocytopenia ([place]) (01/17/2020), Ulcer, Ulcerative colitis, UTI due to extended-spectrum beta lactamase (ESBL) producing Escherichia coli (01/19/2020), and Wears glasses.Marland Kitchen   Matthew Francis presents with chief complaint of Follow-up (1 month follow up CBC. Patient states colitis is having him gassy. Also has concerns on knot on left elbow. Amlodipine and rosuvastatin refill. Referral request for Diabetes eye exam in [place].) .   GI Bleeding: Patient complains of bright red blood per rectum. Symptoms have been present for approximately several weeks. The symptoms are unchanged. This has been associated with abdominal bloating and fatigue .  Matthew Francis denies cough, hematemesis, nausea, and shortness of breath.  Matthew Francis has not used nonsteroidal anti-inflammatory drugs on a regular bases; Matthew Francis is not anticoagulated.  The patient currently denies significant abdominal pain or discomfort.  There is a past history of gastrointestinal bleeding associated with ulcerative colitis.  Patient does have follow-up with GI A1c in few weeks.  Recent CBC showed mild anemia with hemoglobin in the 12's.  Recent B12 and folate were normal.  Patient with a history of steroid-induced diabetes.  It  is diet controlled.  And patient has no other take steroids.  Denies polyuria or polydipsia  Hypertension, established problem, Stable BP Readings from Last 3 Encounters:  01/03/22 138/86  12/26/21 137/86  11/21/21 113/76   Home BP monitoring: 120s over 70s Current  Medications: Amlodipine 10 mg and clonidine 0.1 mg 3 times daily, compliant without side effects.   ROS: Denies any chest pain, shortness of breath, dyspnea on exertion, leg edema.   Hyperlipidemia, established problem, Stable Current medication(s): Rosuvastatin 5 mg and Zetia 10 mg.  Compliant without side effects.  Recent lipid 12/2021 was less than 70  ROS:  No myalgias.  Patient reports left lateral elbow pain.  Denies any injury.  Says it hurts to fully extend.  Past Surgical History:  Procedure Laterality Date   APPENDECTOMY     COLECTOMY     Proctocolectomy with IPAA   COLECTOMY     FOREIGN BODY REMOVAL ABDOMINAL  02/17/2012   Procedure: REMOVAL FOREIGN BODY ABDOMINAL;  Surgeon: Odis Hollingshead, MD;  Location: [facility];  Service: General;  Laterality: N/A;  abdominal wound exploration and removal of foreign body   HERNIA REPAIR     Multiple incisional hernias.   ileoanal anastomosis      Outpatient Medications Prior to Visit  Medication Sig Dispense Refill   acetaminophen (TYLENOL) 325 MG tablet Take 2 tablets (650 mg total) by mouth every 6 (six) hours as needed for moderate pain or mild pain.     ascorbic acid (VITAMIN C) 500 MG tablet Take 1 tablet (500 mg total) by mouth daily. 30 tablet 0   cetirizine (ZYRTEC) 10 MG tablet Take 10 mg by mouth daily as needed for allergies.      Cholecalciferol (VITAMIN D3) 1.25 MG (50000 UT) CAPS TAKE 1 CAPSULE BY MOUTH EVERY WEDNESDAY 4 capsule 5   cloNIDine (CATAPRES) 0.1 MG tablet Take 1 tablet (0.1 mg total) by mouth 3 (three) times daily. 90 tablet 11   dicyclomine (BENTYL) 10 MG capsule Take 10 mg by mouth every 6 (six) hours as needed.     ezetimibe (ZETIA) 10 MG tablet Take 1 tablet (10 mg total) by mouth daily. 30 tablet 2   Glucosamine 500 MG CAPS Take by mouth.     hydrALAZINE (APRESOLINE) 100 MG tablet Take 1 tablet (100 mg total) by mouth 3 (three) times daily. 60 tablet 4   loperamide (IMODIUM) 2 MG  capsule Take 2 capsules (4 mg total) by mouth as needed for diarrhea or loose stools. 30 capsule 0   melatonin 3 MG TABS tablet Take 1 tablet (3 mg total) by mouth at bedtime. 30 tablet 0   Multiple Vitamin (MULTIVITAMIN WITH MINERALS) TABS Take 1 tablet by mouth daily.     Omega-3 Fatty Acids (FISH OIL) 1000 MG CAPS Take by mouth.     pantoprazole (PROTONIX) 40 MG tablet Take 1 tablet (40 mg total) by mouth daily. 30 tablet 3   psyllium (METAMUCIL) 58.6 % packet Take 1 packet by mouth daily as needed (For fiber/thickener).      QUEtiapine (SEROQUEL) 25 MG tablet Take 2 tablets (50 mg total) by mouth at bedtime. 60 tablet 2   rifaximin (XIFAXAN) 550 MG TABS tablet Take 1 tablet (550 mg total) by mouth 2 (two) times daily. 42 tablet 4   Turmeric (QC TUMERIC COMPLEX PO) Take 1 tablet by mouth daily as needed (For inflammatory).     vitamin B-12 (CYANOCOBALAMIN) 100 MCG tablet Take 1 tablet (  100 mcg total) by mouth daily. 30 tablet 0   amLODipine (NORVASC) 10 MG tablet Take 1 tablet (10 mg total) by mouth daily. 30 tablet 2   rosuvastatin (CRESTOR) 5 MG tablet Take 1 tablet (5 mg total) by mouth daily. 30 tablet 5   No facility-administered medications prior to visit.    Family History  Problem Relation Age of Onset   Neurodegenerative disease Mother    Cancer Father        prostate cancer   Cancer Sister    Diabetes Paternal Grandmother    Stroke Paternal Grandfather     Social History   Socioeconomic History   Marital status: Single    Spouse name: Not on file   Number of children: Not on file   Years of education: Not on file   Highest education level: Not on file  Occupational History    Comment: restaurant   Occupation: [employer]  Tobacco Use   Smoking status: Never   Smokeless tobacco: Never  Vaping Use   Vaping Use: Never used  Substance and Sexual Activity   Alcohol use: Yes    Alcohol/week: 21.0 standard drinks of alcohol    Types: 21 Cans of beer per week   Drug  use: Not Currently    Types: Cocaine   Sexual activity: Not Currently  Other Topics Concern   Not on file  Social History Narrative   Not on file   Social Determinants of Health   Financial Resource Strain: Not on file  Food Insecurity: Not on file  Transportation Needs: Not on file  Physical Activity: Not on file  Stress: Not on file  Social Connections: Not on file  Intimate Partner Violence: Not on file                                                                                                 Objective:  Physical Exam: BP 138/86 (BP Location: Left Arm, Patient Position: Sitting, Cuff Size: Large)   Pulse 80   Temp (!) 97.4 F (36.3 C) (Temporal)   Wt 215 lb (97.5 kg)   SpO2 98%   BMI 29.99 kg/m    General: No acute distress. Awake and conversant.  Eyes: Normal conjunctiva, anicteric. Round symmetric pupils.  ENT: Hearing grossly intact. No nasal discharge.  Neck: Neck is supple. No masses or thyromegaly.  ABD: Nontender, nondistended Respiratory: Respirations are non-labored. No auditory wheezing.  Skin: Warm. No rashes or ulcers.  Psych: Alert and oriented. Cooperative, Appropriate mood and affect, Normal judgment.  CV: No cyanosis or JVD MSK: Normal ambulation. No clubbing, left lateral condyle of elbow tender, without overlying edema Neuro: Sensation and CN II-XII grossly normal.        Alesia Banda, MD, MS

## 2022-01-03 NOTE — Patient Instructions (Signed)
We are checking blood counts and iron.  We checking A1C We referring to eye doctor We are prescribing votaren We are refilling rosuvastin and amlodipine

## 2022-01-03 NOTE — Assessment & Plan Note (Signed)
Likely cause abdominal pain and GI bleeding Has upcoming follow-up with GI

## 2022-01-03 NOTE — Assessment & Plan Note (Signed)
Well-controlled on amlodipine 10 mg and clonidine 0.1 mg 3 times daily Refill amlodipine Follow-up 6 months

## 2022-01-03 NOTE — Assessment & Plan Note (Signed)
Likely associated with GI bleeding from ulcerative colitis Has follow-up with GI CBC in 11/2021, mild anemia Recent B12 normal CBC and iron panel

## 2022-01-03 NOTE — Assessment & Plan Note (Signed)
Asymptomatic Diet controlled Recheck hemoglobin A1c Referral to ophthalmology for eye exam

## 2022-01-05 LAB — IRON,TIBC AND FERRITIN PANEL
%SAT: 24 % (calc) (ref 20–48)
Ferritin: 74 ng/mL (ref 38–380)
Iron: 70 ug/dL (ref 50–180)
TIBC: 293 mcg/dL (calc) (ref 250–425)

## 2022-01-13 DIAGNOSIS — K580 Irritable bowel syndrome with diarrhea: Principal | ICD-10-CM

## 2022-01-14 MED ORDER — RIFAXIMIN 550 MG TABLET
ORAL_TABLET | Freq: Two times a day (BID) | ORAL | 2 refills | 30 days | Status: CP
Start: 2022-01-14 — End: ?

## 2022-01-15 ENCOUNTER — Ambulatory Visit
Admit: 2022-01-15 | Discharge: 2022-01-16 | Payer: PRIVATE HEALTH INSURANCE | Attending: Gastroenterology | Primary: Gastroenterology

## 2022-01-15 DIAGNOSIS — K91850 Pouchitis: Principal | ICD-10-CM

## 2022-01-15 MED ORDER — CEFDINIR 300 MG CAPSULE
ORAL_CAPSULE | Freq: Two times a day (BID) | ORAL | 0 refills | 14 days | Status: CP
Start: 2022-01-15 — End: 2022-01-29

## 2022-01-29 ENCOUNTER — Encounter: Payer: Self-pay | Admitting: Physical Medicine & Rehabilitation

## 2022-01-29 ENCOUNTER — Encounter: Payer: 59 | Attending: Physical Medicine & Rehabilitation | Admitting: Physical Medicine & Rehabilitation

## 2022-01-29 VITALS — BP 111/74 | HR 87 | Ht 71.0 in | Wt 215.0 lb

## 2022-01-29 DIAGNOSIS — I6930 Unspecified sequelae of cerebral infarction: Secondary | ICD-10-CM | POA: Insufficient documentation

## 2022-01-29 DIAGNOSIS — G5621 Lesion of ulnar nerve, right upper limb: Secondary | ICD-10-CM | POA: Insufficient documentation

## 2022-01-29 NOTE — Patient Instructions (Addendum)
PLEASE FEEL FREE TO CALL OUR OFFICE WITH ANY PROBLEMS OR QUESTIONS ([phone])   RIGHT ELBOW  EXTEND RIGHT ELBOW WHEN YOU CAN ESPECIALLY AT NIGHT WHEN YOU SLEEP ELBOW PAD AVOID SLEEPING ON YOUR SIDES IF YOU CAN.

## 2022-01-29 NOTE — Progress Notes (Signed)
Subjective:    Patient ID: Matthew Francis, male    DOB: 1960-10-25, 61 y.o.   MRN: [id number]  HPI  Matthew Francis is here in follow up of his ICH. He has started a new job at [employer] and the work was more than hew was told it was going to be. He is still sore from working yesterday. He is supposed to be working today but won't be going because of how he feels.   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   He still deals with some issues related to his ulcerative colitis but is followed closely by his GI specialist.  He does report some ongoing numbness along the ulnar half of his right hand.  I asked him how he sleeps he typically sleeps on his side with his elbows bent.  He feels that the sensory loss might be a bit more severe than it was several months ago.  From a cognitive standpoint he is close to baseline.  He still has some issues with short-term memory deficits at times but is usually able to compensate for these.  Headaches for the most part  have improved although he still has 1 every once in a while.   Pain Inventory Average Pain 3 Pain Right Now 6 My pain is constant, dull, and shooting  LOCATION OF PAIN  neck, elbow, wrist, back  BOWEL Number of stools per week: 36-45 week    BLADDER Normal  Mobility how many minutes can you walk? 15 ability to climb steps?  yes do you drive?  yes  Function employed # of hrs/week 30 hours per week [employer]  Do you have any goals in this area?  yes  Neuro/Psych bowel control problems weakness numbness tingling depression  Prior Studies Any changes since last visit?  no  Physicians involved in your care Primary care Matthew Igo  MD   Family History  Problem Relation Age of Onset   Neurodegenerative disease Mother    Cancer Father        prostate cancer   Cancer Sister    Diabetes Paternal Grandmother    Stroke Paternal Grandfather    Social History   Socioeconomic History    Marital status: Single    Spouse name: Not on file   Number of children: Not on file   Years of education: Not on file   Highest education level: Not on file  Occupational History    Comment: restaurant   Occupation: [employer]  Tobacco Use   Smoking status: Never   Smokeless tobacco: Never  Vaping Use   Vaping Use: Never used  Substance and Sexual Activity   Alcohol use: Yes    Alcohol/week: 21.0 standard drinks of alcohol    Types: 21 Cans of beer per week   Drug use: Not Currently    Types: Cocaine   Sexual activity: Not Currently  Other Topics Concern   Not on file  Social History Narrative   Not on file   Social Determinants of Health   Financial Resource Strain: Not on file  Food Insecurity: Not on file  Transportation Needs: Not on file  Physical Activity: Not on file  Stress: Not on file  Social Connections: Not on file   Past Surgical History:  Procedure Laterality Date   APPENDECTOMY     COLECTOMY     Proctocolectomy with IPAA   [place]  02/17/2012   Procedure: REMOVAL FOREIGN BODY ABDOMINAL;  Surgeon: Odis Hollingshead, MD;  Location: [facility];  Service: General;  Laterality: N/A;  abdominal wound exploration and removal of foreign body   HERNIA REPAIR     Multiple incisional hernias.   ileoanal anastomosis     Past Medical History:  Diagnosis Date   Arthritis    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 02/09/2012   GERD (gastroesophageal reflux disease)    H/O ulcerative colitis    Hyperlipidemia    Hypertension    ICH (intracerebral hemorrhage) ([place]) 12/11/2020   Inguinal hernia    Nontraumatic subcortical hemorrhage of brain (HCC)    Severe sepsis ([place]) 01/17/2020   Thrombocytopenia ([place]) 01/17/2020   Ulcer    ulcerative colitis   Ulcerative colitis    UTI due to extended-spectrum beta lactamase (ESBL) producing Escherichia coli 01/19/2020   Wears glasses    Ht 5' 11"  (1.803 m)   Wt  215 lb (97.5 kg)   BMI 29.99 kg/m   Opioid Risk Score:   Fall Risk Score:  `1  Depression screen [facility] 2/9     11/21/2021    3:11 PM 11/21/2021    2:29 PM 11/12/2021    3:59 PM 06/26/2021   11:49 AM 04/03/2021   10:29 AM 01/30/2021    1:54 PM 01/11/2021   10:54 AM  Depression screen PHQ 2/9  Decreased Interest 1 1 3 1 3 1 1   Down, Depressed, Hopeless 2 1 2 1 3 1 1   PHQ - 2 Score 3 2 5 2 6 2 2   Altered sleeping 3  3  3  1   Tired, decreased energy 3  2  3  1   Change in appetite 1  2  3   0  Feeling bad or failure about yourself  3  3  3  1   Trouble concentrating 2  2  3   0  Moving slowly or fidgety/restless 1  0  0  0  Suicidal thoughts 1  1  0  0  PHQ-9 Score 17  18  21  5   Difficult doing work/chores   Not difficult at all  Somewhat difficult     Review of Systems  Respiratory:  Positive for shortness of breath.   Gastrointestinal:  Positive for diarrhea and nausea.  All other systems reviewed and are negative.      Objective:   Physical Exam  General: No acute distress HEENT: NCAT, EOMI, oral membranes moist Cards: reg rate  Chest: normal effort Abdomen: Soft, NT, ND Skin: dry, intact Extremities: no edema Psych: pleasant and appropriate  Skin: intact Neuro:Alert and oriented x 3. Normal insight and awareness. Intact Memory. Normal language and speech. Cranial nerve exam unremarkable  Strength 5/5 in all 4's. Functional balance. Sensation grossly intact. +TINEL'S AT RIGHT ULNAR GROOVE Musculoskeletal: Full ROM, No pain with AROM or PROM in the neck, trunk, or extremities. Posture appropriate            Assessment & Plan:    1.  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  Pain Management: headaches resolved.              -  tylenol prn.  3.  Mood/sleep: ego support of family -continue seroquel 25-22m at bedtime which seems to work most nights - melatonin 111m       4.  Hypertension.  Clonidine 01 mg daily, Norvasc 10 mg daily, hydralazine 100 mg twice daily.                -bp elevated at time, especially with stress             -maintain hydralazine 10092mid             -increase clonidine to 0.1mg40mD (start at bid) 5.  Ulcerative colitis.Xifaxan 550 mg twice daily, Entocort 9 mg daily             -per GI--  6. Type 2 diabetes mellitus.  Hemoglobin A1c 5.9.  SSI.              -sugars generally controlled   7. Right sided ulnar sensory loss  -To start, elbow pad, elbow extension while sleeping  -Avoid elbow flexion and compression of the inner elbow when sitting or sleeping as well.           -Advised the patient that if his symptoms should progress that he contact our office and we will set him up for nerve conduction and EMG studies.   15 min  of face to face patient care time were spent during this visit. All questions were encouraged and answered.  Follow up with me PRN

## 2022-01-29 NOTE — Progress Notes (Deleted)
   Subjective:    Patient ID: Matthew Francis, male    DOB: [birth year], 61 y.o.   MRN: [id number]  HPI   .cpr Review of Systems     Objective:   Physical Exam        Assessment & Plan:

## 2022-02-06 ENCOUNTER — Other Ambulatory Visit: Payer: Self-pay | Admitting: Family Medicine

## 2022-02-06 DIAGNOSIS — E782 Mixed hyperlipidemia: Secondary | ICD-10-CM

## 2022-02-08 ENCOUNTER — Other Ambulatory Visit: Payer: Self-pay | Admitting: Family Medicine

## 2022-02-08 DIAGNOSIS — G479 Sleep disorder, unspecified: Secondary | ICD-10-CM

## 2022-02-24 ENCOUNTER — Other Ambulatory Visit: Payer: Self-pay | Admitting: Family Medicine

## 2022-02-24 ENCOUNTER — Encounter: Payer: Self-pay | Admitting: Family Medicine

## 2022-02-24 ENCOUNTER — Ambulatory Visit: Payer: Commercial Managed Care - HMO | Admitting: Family Medicine

## 2022-02-24 VITALS — BP 126/84 | HR 79 | Temp 97.5°F | Wt 213.2 lb

## 2022-02-24 DIAGNOSIS — K219 Gastro-esophageal reflux disease without esophagitis: Secondary | ICD-10-CM

## 2022-02-24 DIAGNOSIS — E118 Type 2 diabetes mellitus with unspecified complications: Secondary | ICD-10-CM | POA: Diagnosis not present

## 2022-02-24 DIAGNOSIS — G479 Sleep disorder, unspecified: Secondary | ICD-10-CM | POA: Diagnosis not present

## 2022-02-24 DIAGNOSIS — E1122 Type 2 diabetes mellitus with diabetic chronic kidney disease: Secondary | ICD-10-CM

## 2022-02-24 DIAGNOSIS — I10 Essential (primary) hypertension: Secondary | ICD-10-CM

## 2022-02-24 DIAGNOSIS — N182 Chronic kidney disease, stage 2 (mild): Secondary | ICD-10-CM

## 2022-02-24 MED ORDER — LISINOPRIL 5 MG PO TABS: 5.0000 mg | ORAL_TABLET | Freq: Every day | ORAL | 3 refills | Status: DC

## 2022-02-24 MED ORDER — AMLODIPINE BESYLATE 10 MG PO TABS
10.0000 mg | ORAL_TABLET | Freq: Every day | ORAL | 2 refills | Status: DC
Start: 2022-02-24 — End: 2022-08-08

## 2022-02-24 MED ORDER — TRAZODONE HCL 50 MG PO TABS: 25.0000 mg | ORAL_TABLET | Freq: Every evening | ORAL | 3 refills | Status: DC | PRN

## 2022-02-24 MED ORDER — PANTOPRAZOLE SODIUM 40 MG PO TBEC: 80.0000 mg | DELAYED_RELEASE_TABLET | Freq: Two times a day (BID) | ORAL | 3 refills | Status: DC

## 2022-02-24 NOTE — Patient Instructions (Signed)
For insomnia, stop seroquel, start trazodone For kidney disease, we are checking urine, start lisinopril for kidney protection For GERD, Increase protonix to twice a day

## 2022-02-24 NOTE — Assessment & Plan Note (Signed)
Urine microalbumin Start lisinopril for renal protection

## 2022-02-24 NOTE — Telephone Encounter (Signed)
Please advise, patient is requesting 90 day supply. Ok rx ?

## 2022-02-24 NOTE — Assessment & Plan Note (Signed)
Well-controlled Refill amlodipine Also adding lisinopril for renal protection associated with diabetes

## 2022-02-24 NOTE — Assessment & Plan Note (Signed)
Worsening Increase Protonix 40 mg twice daily Patient follow-up with GI for ongoing issues

## 2022-02-24 NOTE — Progress Notes (Signed)
Assessment/Plan:   Problem List Items Addressed This Visit       Cardiovascular and Mediastinum   Essential hypertension    Well-controlled Refill amlodipine Also adding lisinopril for renal protection associated with diabetes      Relevant Medications   lisinopril (ZESTRIL) 5 MG tablet   amLODipine (NORVASC) 10 MG tablet     Digestive   Gastroesophageal reflux disease    Worsening Increase Protonix 40 mg twice daily Patient follow-up with GI for ongoing issues      Relevant Medications   pantoprazole (PROTONIX) 40 MG tablet     Endocrine   Type 2 diabetes mellitus with stage 2 chronic kidney disease, without long-term current use of insulin (HCC) - Primary    Urine microalbumin Start lisinopril for renal protection      Relevant Medications   lisinopril (ZESTRIL) 5 MG tablet     Other   Sleep disorder   Relevant Medications   traZODone (DESYREL) 50 MG tablet       Subjective:  HPI:  OVADIA LOPP is a 61 y.o. male who has Essential hypertension; Ulcerative colitis ([place]); Sleep disorder; Hyperlipidemia; History of hemorrhagic cerebrovascular accident (CVA) with residual deficit; Muscle spasm; Gastroesophageal reflux disease; Acute right hip pain; Type 2 diabetes mellitus with stage 2 chronic kidney disease, without long-term current use of insulin ([place]); Cocaine abuse ([place]); Alcohol abuse; Recurrent major depressive disorder, in remission ([place]); Anxiety; Vitamin D deficiency; B12 deficiency; Anemia; Lateral epicondylitis of left elbow; and Ulnar neuropathy at elbow of right upper extremity on their problem list..   He  has a past medical history of Arthritis, 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 (02/09/2012), GERD (gastroesophageal reflux disease), H/O ulcerative colitis, Hyperlipidemia, Hypertension, ICH (intracerebral hemorrhage) ([place]) (12/11/2020), Inguinal hernia, Nontraumatic subcortical hemorrhage of brain ([place]), Severe sepsis ([place]) (01/17/2020),  Thrombocytopenia ([place]) (01/17/2020), Ulcer, Ulcerative colitis, UTI due to extended-spectrum beta lactamase (ESBL) producing Escherichia coli (01/19/2020), and Wears glasses.Marland Kitchen   He presents with chief complaint of Follow-up (3 month follow up. Rx pantoprazole, amlodipine and seroquel. Nonfasting) .   Patient has history of insomnia.  He has been on Seroquel for many years.  He did miss a day a few weeks ago and said that the following day made him feel "funny".  He also states that Seroquel can have drowsiness the following day.  Patient has a history of GERD, ulcerative colitis.  He does follow with GI.  Last saw them 1 month ago.  He says that recently his GERD has been worsening.  He does take Protonix 40 mg daily.  He has had no recent hematemesis.  Hypertension, established problem, Stable BP Readings from Last 3 Encounters:  02/24/22 126/84  01/29/22 111/74  01/03/22 138/86   Current Medications: Amlodipine 10 mg, compliant without side effects. Interim History: None  ROS: Denies any chest pain, shortness of breath, dyspnea on exertion, leg edema.        Past Surgical History:  Procedure Laterality Date   APPENDECTOMY     COLECTOMY     Proctocolectomy with IPAA   COLECTOMY     FOREIGN BODY REMOVAL ABDOMINAL  02/17/2012   Procedure: REMOVAL FOREIGN BODY ABDOMINAL;  Surgeon: Odis Hollingshead, MD;  Location: [facility];  Service: General;  Laterality: N/A;  abdominal wound exploration and removal of foreign body   HERNIA REPAIR     Multiple incisional hernias.   ileoanal anastomosis      Outpatient Medications Prior to  Visit  Medication Sig Dispense Refill   acetaminophen (TYLENOL) 325 MG tablet Take 2 tablets (650 mg total) by mouth every 6 (six) hours as needed for moderate pain or mild pain.     ascorbic acid (VITAMIN C) 500 MG tablet Take 1 tablet (500 mg total) by mouth daily. 30 tablet 0   cetirizine (ZYRTEC) 10 MG tablet Take 10 mg by mouth daily as  needed for allergies.      Cholecalciferol (VITAMIN D3) 1.25 MG (50000 UT) CAPS TAKE 1 CAPSULE BY MOUTH EVERY WEDNESDAY 4 capsule 5   cloNIDine (CATAPRES) 0.1 MG tablet Take 1 tablet (0.1 mg total) by mouth 3 (three) times daily. 90 tablet 11   diclofenac Sodium (VOLTAREN) 1 % GEL Apply 4 g topically 4 (four) times daily as needed. 100 g 3   dicyclomine (BENTYL) 10 MG capsule Take 10 mg by mouth every 6 (six) hours as needed.     ezetimibe (ZETIA) 10 MG tablet TAKE 1 TABLET(10 MG) BY MOUTH DAILY 30 tablet 2   Glucosamine 500 MG CAPS Take by mouth.     hydrALAZINE (APRESOLINE) 100 MG tablet Take 1 tablet (100 mg total) by mouth 3 (three) times daily. 60 tablet 4   loperamide (IMODIUM) 2 MG capsule Take 2 capsules (4 mg total) by mouth as needed for diarrhea or loose stools. 30 capsule 0   melatonin 3 MG TABS tablet Take 1 tablet (3 mg total) by mouth at bedtime. 30 tablet 0   Multiple Vitamin (MULTIVITAMIN WITH MINERALS) TABS Take 1 tablet by mouth daily.     Omega-3 Fatty Acids (FISH OIL) 1000 MG CAPS Take by mouth.     psyllium (METAMUCIL) 58.6 % packet Take 1 packet by mouth daily as needed (For fiber/thickener).      QUEtiapine (SEROQUEL) 25 MG tablet TAKE 2 TABLETS(50 MG) BY MOUTH AT BEDTIME 60 tablet 2   rifaximin (XIFAXAN) 550 MG TABS tablet Take 1 tablet (550 mg total) by mouth 2 (two) times daily. 42 tablet 4   rosuvastatin (CRESTOR) 5 MG tablet Take 1 tablet (5 mg total) by mouth daily. 30 tablet 5   Turmeric (QC TUMERIC COMPLEX PO) Take 1 tablet by mouth daily as needed (For inflammatory).     vitamin B-12 (CYANOCOBALAMIN) 100 MCG tablet Take 1 tablet (100 mcg total) by mouth daily. 30 tablet 0   amLODipine (NORVASC) 10 MG tablet Take 1 tablet (10 mg total) by mouth daily. 30 tablet 2   pantoprazole (PROTONIX) 40 MG tablet Take 1 tablet (40 mg total) by mouth daily. 30 tablet 3   cefdinir (OMNICEF) 300 MG capsule Take by mouth. (Patient not taking: Reported on 02/24/2022)     No  facility-administered medications prior to visit.    Family History  Problem Relation Age of Onset   Neurodegenerative disease Mother    Cancer Father        prostate cancer   Cancer Sister    Diabetes Paternal Grandmother    Stroke Paternal Grandfather     Social History   Socioeconomic History   Marital status: Single    Spouse name: Not on file   Number of children: Not on file   Years of education: Not on file   Highest education level: Not on file  Occupational History    Comment: restaurant   Occupation: [employer]  Tobacco Use   Smoking status: Never   Smokeless tobacco: Never  Vaping Use   Vaping Use: Never used  Substance  and Sexual Activity   Alcohol use: Yes    Alcohol/week: 21.0 standard drinks of alcohol    Types: 21 Cans of beer per week   Drug use: Not Currently    Types: Cocaine   Sexual activity: Not Currently  Other Topics Concern   Not on file  Social History Narrative   Not on file   Social Determinants of Health   Financial Resource Strain: Not on file  Food Insecurity: Not on file  Transportation Needs: Not on file  Physical Activity: Not on file  Stress: Not on file  Social Connections: Not on file  Intimate Partner Violence: Not on file                                                                                                 Objective:  Physical Exam: BP 126/84 (BP Location: Left Arm, Patient Position: Sitting, Cuff Size: Large)   Pulse 79   Temp (!) 97.5 F (36.4 C) (Temporal)   Wt 213 lb 3.2 oz (96.7 kg)   SpO2 99%   BMI 29.74 kg/m    General: No acute distress. Awake and conversant.  Eyes: Normal conjunctiva, anicteric. Round symmetric pupils.  ENT: Hearing grossly intact. No nasal discharge.  Neck: Neck is supple. No masses or thyromegaly.  Respiratory: Respirations are non-labored. No auditory wheezing.  Skin: Warm. No rashes or ulcers.  Psych: Alert and oriented. Cooperative, Appropriate mood and affect,  Normal judgment.  CV: No cyanosis or JVD MSK: Normal ambulation. No clubbing  Neuro: Sensation and CN II-XII grossly normal.        Alesia Banda, MD, MS

## 2022-02-25 LAB — MICROALBUMIN / CREATININE URINE RATIO
Creatinine,U: 182.3 mg/dL
Microalb Creat Ratio: 0.7 mg/g (ref 0.0–30.0)
Microalb, Ur: 1.4 mg/dL (ref 0.0–1.9)

## 2022-02-26 ENCOUNTER — Telehealth: Payer: Self-pay | Admitting: Family Medicine

## 2022-02-26 ENCOUNTER — Telehealth: Payer: Self-pay

## 2022-02-26 DIAGNOSIS — K219 Gastro-esophageal reflux disease without esophagitis: Secondary | ICD-10-CM

## 2022-02-26 NOTE — Telephone Encounter (Signed)
Spoke with pharmacist and they would like clarity on signature on pantoprazole and quantity. Please advise Dose, Route, Frequency: As Directed  Dispense Quantity: 90 tablet Refills: 0   Note to Pharmacy: **Patient requests 90 days supply**       Sig: TAKE 2 TABLETS(80 MG) BY MOUTH TWICE DAILY BEFORE A MEAL

## 2022-02-26 NOTE — Telephone Encounter (Signed)
A user error has taken place: encounter opened in error, closed for administrative reasons.

## 2022-02-26 NOTE — Telephone Encounter (Signed)
Caller Name: [facility]  Call back phone #: [phone]  Reason for Call: Pharmacy called with questions for prescription Pantoprazole put in by Dr. Grandville Silos yesterday. Asked for call back

## 2022-02-27 MED ORDER — PANTOPRAZOLE SODIUM 40 MG PO TBEC: 40.0000 mg | DELAYED_RELEASE_TABLET | Freq: Two times a day (BID) | ORAL | 0 refills | Status: DC

## 2022-02-28 ENCOUNTER — Telehealth: Payer: Self-pay | Admitting: Family Medicine

## 2022-02-28 DIAGNOSIS — K219 Gastro-esophageal reflux disease without esophagitis: Secondary | ICD-10-CM

## 2022-02-28 MED ORDER — FAMOTIDINE 40 MG PO TABS: 40.0000 mg | ORAL_TABLET | Freq: Every day | ORAL | 0 refills | Status: DC

## 2022-02-28 MED ORDER — PANTOPRAZOLE SODIUM 40 MG PO TBEC: 40.0000 mg | DELAYED_RELEASE_TABLET | Freq: Every day | ORAL | 0 refills | Status: DC

## 2022-02-28 NOTE — Telephone Encounter (Signed)
Pt  [phone] Pt said the pharmacy told him his scripe had been changed to 2 pills a day and the insurance will only cover 1 pill a day. Please call the pharmacy for clarification.

## 2022-02-28 NOTE — Telephone Encounter (Signed)
Please advise, see below.

## 2022-03-10 ENCOUNTER — Ambulatory Visit (INDEPENDENT_AMBULATORY_CARE_PROVIDER_SITE_OTHER): Payer: Commercial Managed Care - HMO | Admitting: Ophthalmology

## 2022-03-10 ENCOUNTER — Encounter (INDEPENDENT_AMBULATORY_CARE_PROVIDER_SITE_OTHER): Payer: Self-pay | Admitting: Ophthalmology

## 2022-03-10 DIAGNOSIS — H43823 Vitreomacular adhesion, bilateral: Secondary | ICD-10-CM | POA: Insufficient documentation

## 2022-03-10 DIAGNOSIS — E119 Type 2 diabetes mellitus without complications: Secondary | ICD-10-CM | POA: Diagnosis not present

## 2022-03-10 DIAGNOSIS — H2513 Age-related nuclear cataract, bilateral: Secondary | ICD-10-CM | POA: Diagnosis not present

## 2022-03-10 DIAGNOSIS — N1831 Chronic kidney disease, stage 3a: Secondary | ICD-10-CM | POA: Insufficient documentation

## 2022-03-10 DIAGNOSIS — H25043 Posterior subcapsular polar age-related cataract, bilateral: Secondary | ICD-10-CM | POA: Diagnosis not present

## 2022-03-10 NOTE — Assessment & Plan Note (Signed)
The patient has diabetes without any evidence of retinopathy. The patient advised to maintain good blood glucose control, excellent blood pressure control, and favorable levels of cholesterol, low density lipoprotein, and high density lipoproteins. Follow up in 1 year was recommended. Explained that fluctuations in visual acuity , or "out of focus", may result from large variations of blood sugar control.

## 2022-03-10 NOTE — Assessment & Plan Note (Signed)
Glass analogy reviewed.  Patient having difficulty with nighttime vision difficulties as well as had oncoming headlights likely from the [facility] cataract

## 2022-03-10 NOTE — Progress Notes (Signed)
03/10/2022     CHIEF COMPLAINT Patient presents for  Chief Complaint  Patient presents with   Retina Evaluation      HISTORY OF PRESENT ILLNESS: Matthew Francis is a 61 y.o. male who presents to the clinic today for:   HPI     Retina Evaluation           Laterality: both eyes   Associated Symptoms: Negative for Flashes, Floaters, Distortion, Blind Spot, Pain, Redness, Photophobia, Glare, Trauma, Scalp Tenderness, Jaw Claudication, Shoulder/Hip pain, Fever, Weight Loss and Fatigue         Comments   NP- DIABETIC EYE EXAM, REF BY PCP, NOTES REVIEWED. Pt stated vision is gradually worsened. Pt stated it is a little difficult driving at night.  Pts last A1C was 6.7.        Last edited by Silvestre Moment on 03/10/2022  2:43 PM.      Referring physician: [address], [place],  [place] [address]  HISTORICAL INFORMATION:   Selected notes from the MEDICAL RECORD NUMBER    Lab Results  Component Value Date   HGBA1C 6.6 (H) 01/03/2022     CURRENT MEDICATIONS: No current outpatient medications on file. (Ophthalmic Drugs)   No current facility-administered medications for this visit. (Ophthalmic Drugs)   Current Outpatient Medications (Other)  Medication Sig   acetaminophen (TYLENOL) 325 MG tablet Take 2 tablets (650 mg total) by mouth every 6 (six) hours as needed for moderate pain or mild pain.   amLODipine (NORVASC) 10 MG tablet Take 1 tablet (10 mg total) by mouth daily.   ascorbic acid (VITAMIN C) 500 MG tablet Take 1 tablet (500 mg total) by mouth daily.   cetirizine (ZYRTEC) 10 MG tablet Take 10 mg by mouth daily as needed for allergies.    Cholecalciferol (VITAMIN D3) 1.25 MG (50000 UT) CAPS TAKE 1 CAPSULE BY MOUTH EVERY WEDNESDAY   cloNIDine (CATAPRES) 0.1 MG tablet Take 1 tablet (0.1 mg total) by mouth 3 (three) times daily.   diclofenac Sodium (VOLTAREN) 1 % GEL Apply 4 g topically 4 (four) times daily as needed.   dicyclomine  (BENTYL) 10 MG capsule Take 10 mg by mouth every 6 (six) hours as needed.   ezetimibe (ZETIA) 10 MG tablet TAKE 1 TABLET(10 MG) BY MOUTH DAILY   famotidine (PEPCID) 40 MG tablet Take 1 tablet (40 mg total) by mouth daily.   Glucosamine 500 MG CAPS Take by mouth.   hydrALAZINE (APRESOLINE) 100 MG tablet Take 1 tablet (100 mg total) by mouth 3 (three) times daily.   lisinopril (ZESTRIL) 5 MG tablet Take 1 tablet (5 mg total) by mouth daily.   loperamide (IMODIUM) 2 MG capsule Take 2 capsules (4 mg total) by mouth as needed for diarrhea or loose stools.   melatonin 3 MG TABS tablet Take 1 tablet (3 mg total) by mouth at bedtime.   Multiple Vitamin (MULTIVITAMIN WITH MINERALS) TABS Take 1 tablet by mouth daily.   Omega-3 Fatty Acids (FISH OIL) 1000 MG CAPS Take by mouth.   pantoprazole (PROTONIX) 40 MG tablet Take 1 tablet (40 mg total) by mouth daily.   psyllium (METAMUCIL) 58.6 % packet Take 1 packet by mouth daily as needed (For fiber/thickener).    QUEtiapine (SEROQUEL) 25 MG tablet TAKE 2 TABLETS(50 MG) BY MOUTH AT BEDTIME   rifaximin (XIFAXAN) 550 MG TABS tablet Take 1 tablet (550 mg total) by mouth 2 (two) times daily.   rosuvastatin (CRESTOR)  5 MG tablet Take 1 tablet (5 mg total) by mouth daily.   traZODone (DESYREL) 50 MG tablet Take 0.5-1 tablets (25-50 mg total) by mouth at bedtime as needed for sleep.   Turmeric (QC TUMERIC COMPLEX PO) Take 1 tablet by mouth daily as needed (For inflammatory).   vitamin B-12 (CYANOCOBALAMIN) 100 MCG tablet Take 1 tablet (100 mcg total) by mouth daily.   No current facility-administered medications for this visit. (Other)      REVIEW OF SYSTEMS: ROS   Negative for: Constitutional, Gastrointestinal, Neurological, Skin, Genitourinary, Musculoskeletal, HENT, Endocrine, Cardiovascular, Eyes, Respiratory, Psychiatric, Allergic/Imm, Heme/Lymph Last edited by Silvestre Moment on 03/10/2022  2:43 PM.       ALLERGIES Allergies  Allergen Reactions   Asacol  [Mesalamine] Swelling    Throat    Penicillins Anaphylaxis    Has patient had a PCN reaction causing immediate rash, facial/tongue/throat swelling, SOB or lightheadedness with hypotension: Yes Has patient had a PCN reaction causing severe rash involving mucus membranes or skin necrosis: No Has patient had a PCN reaction that required hospitalization: Yes Has patient had a PCN reaction occurring within the last 10 years: No If all of the above answers are "NO", then may proceed with Cephalosporin use.   Sulfa Antibiotics Anaphylaxis   Atorvastatin Other (See Comments)    Myalgia, muscle cramps   Bee Pollen Hives   Bee Venom Hives and Swelling   Iodinated Contrast Media     Pt reports allergy at [place]. Rapid heartbeat and dyspnea    PAST MEDICAL HISTORY Past Medical History:  Diagnosis Date   Arthritis    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 02/09/2012   GERD (gastroesophageal reflux disease)    H/O ulcerative colitis    Hyperlipidemia    Hypertension    ICH (intracerebral hemorrhage) ([place]) 12/11/2020   Inguinal hernia    Nontraumatic subcortical hemorrhage of brain (HCC)    Severe sepsis ([place]) 01/17/2020   Thrombocytopenia ([place]) 01/17/2020   Ulcer    ulcerative colitis   Ulcerative colitis    UTI due to extended-spectrum beta lactamase (ESBL) producing Escherichia coli 01/19/2020   Wears glasses    Past Surgical History:  Procedure Laterality Date   APPENDECTOMY     COLECTOMY     Proctocolectomy with IPAA   COLECTOMY     FOREIGN BODY REMOVAL ABDOMINAL  02/17/2012   Procedure: REMOVAL FOREIGN BODY ABDOMINAL;  Surgeon: Odis Hollingshead, MD;  Location: [facility];  Service: General;  Laterality: N/A;  abdominal wound exploration and removal of foreign body   HERNIA REPAIR     Multiple incisional hernias.   ileoanal anastomosis      FAMILY HISTORY Family History  Problem Relation Age of Onset   Neurodegenerative disease Mother    Cancer  Father        prostate cancer   Cancer Sister    Diabetes Paternal Grandmother    Stroke Paternal Grandfather     SOCIAL HISTORY Social History   Tobacco Use   Smoking status: Never   Smokeless tobacco: Never  Vaping Use   Vaping Use: Never used  Substance Use Topics   Alcohol use: Yes    Alcohol/week: 21.0 standard drinks of alcohol    Types: 21 Cans of beer per week   Drug use: Not Currently    Types: Cocaine         OPHTHALMIC EXAM:  Base Eye Exam     Visual Acuity (ETDRS)  Right Left   Dist cc 20/20 -1 20/20 -1    Correction: Glasses         Tonometry (Tonopen, 2:47 PM)       Right Left   Pressure 20 20         Pupils       Pupils APD   Right PERRL None   Left PERRL None         Visual Fields       Left Right    Full Full         Extraocular Movement       Right Left    Full, Ortho Full, Ortho         Neuro/Psych     Oriented x3: Yes         Dilation     Both eyes: 1.0% Mydriacyl, 2.5% Phenylephrine @ 2:47 PM           Slit Lamp and Fundus Exam     External Exam       Right Left   External Normal Normal         Slit Lamp Exam       Right Left   Lids/Lashes Normal Normal   Conjunctiva/Sclera White and quiet White and quiet   Cornea Clear Clear   Anterior Chamber Deep and quiet Deep and quiet   Iris Round and reactive Round and reactive   Lens 1+ Nuclear sclerosis, 1+ Posterior subcapsular cataract in the visual axis 1+ Nuclear sclerosis, 1+ Posterior subcapsular cataract in the visual axis   Anterior Vitreous Normal Normal         Fundus Exam       Right Left   Posterior Vitreous Normal Normal   Disc Normal Normal   C/D Ratio 0.25 0.25   Macula Normal Normal   Vessels no DR no DR   Periphery Normal Normal            IMAGING AND PROCEDURES  Imaging and Procedures for 03/10/22  OCT, Retina - OU - Both Eyes       Right Eye Quality was good. Scan locations included subfoveal.  Central Foveal Thickness: 256. Findings include normal foveal contour, vitreomacular adhesion .   Left Eye Quality was good. Scan locations included subfoveal. Central Foveal Thickness: 266. Progression has no prior data. Findings include vitreomacular adhesion .   Notes OS with foveal macular traction Minor no distortion of the fovea no impact on acuity prefoveal floaters also present OS        Color Fundus Photography Optos - OU - Both Eyes       Right Eye Progression has no prior data. Disc findings include normal observations. Macula : normal observations.   Left Eye Progression has no prior data. Disc findings include normal observations. Macula : normal observations.   Notes Bilateral media opacity consistent with that seen clinical examination of central PSC type cataract in conjunction with [place]  No detectable DR              ASSESSMENT/PLAN:  Diabetes mellitus without complication ([place]) The patient has diabetes without any evidence of retinopathy. The patient advised to maintain good blood glucose control, excellent blood pressure control, and favorable levels of cholesterol, low density lipoprotein, and high density lipoproteins. Follow up in 1 year was recommended. Explained that fluctuations in visual acuity , or "out of focus", may result from large variations of blood sugar control.  Nuclear sclerotic cataract of both eyes  Glass analogy reviewed.  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 Vitreomacular adhesion of both eyes  H43.823 OCT, Retina - OU - Both Eyes    Color Fundus Photography Optos - OU - Both Eyes    2. Diabetes mellitus without complication ([place])  U82.8      3. Nuclear sclerotic cataract of both eyes  H25.13     4. 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  OU with symptomatic cataracts now particularly driving at night.  Patient would like to proceed with evaluation with cataract surgery.  We will refer to [facility] eye care.  2.  3.  Ophthalmic Meds Ordered this visit:  No orders of the defined types were placed in this encounter.      Return in about 1 year (around 03/11/2023) for DILATE OU, COLOR FP, OCT.  There are no Patient Instructions on file for this visit.   Explained the diagnoses, plan, and follow up with the patient and they expressed understanding.  Patient expressed understanding of the importance of proper follow up care.   Clent Demark Richerd Grime M.D. Diseases & Surgery of the Retina and Vitreous Retina & Diabetic [place] 03/10/22     Abbreviations: M myopia (nearsighted); A astigmatism; H hyperopia (farsighted); P presbyopia; Mrx spectacle prescription;  CTL contact lenses; OD right eye; OS left eye; OU both eyes  XT exotropia; ET esotropia; PEK punctate epithelial keratitis; PEE punctate epithelial erosions; DES dry eye syndrome; MGD meibomian gland dysfunction; ATs artificial tears; PFAT's preservative free artificial tears; [place] nuclear sclerotic cataract; PSC posterior subcapsular cataract; ERM epi-retinal membrane; PVD posterior vitreous detachment; RD retinal detachment; DM diabetes mellitus; DR diabetic retinopathy; NPDR non-proliferative diabetic retinopathy; PDR proliferative diabetic retinopathy; CSME clinically significant macular edema; DME diabetic macular edema; dbh dot blot hemorrhages; CWS cotton wool spot; POAG primary open angle glaucoma; C/D cup-to-disc ratio; HVF humphrey visual field; GVF goldmann visual field; OCT optical coherence tomography; IOP intraocular pressure; BRVO Branch retinal vein occlusion; CRVO central retinal vein occlusion; CRAO central retinal artery  occlusion; BRAO branch retinal artery occlusion; RT retinal tear; SB scleral buckle; PPV pars plana vitrectomy; VH Vitreous hemorrhage; PRP panretinal laser photocoagulation; IVK intravitreal kenalog; VMT vitreomacular traction; MH Macular hole;  NVD neovascularization of the disc; NVE neovascularization elsewhere; AREDS age related eye disease study; ARMD age related macular degeneration; POAG primary open angle glaucoma; EBMD epithelial/anterior basement membrane dystrophy; ACIOL anterior chamber intraocular lens; IOL intraocular lens; PCIOL posterior chamber intraocular lens; Phaco/IOL phacoemulsification with intraocular lens placement; Uncertain photorefractive keratectomy; LASIK laser assisted in situ keratomileusis; HTN hypertension; DM diabetes mellitus; COPD chronic obstructive pulmonary disease

## 2022-03-10 NOTE — Assessment & Plan Note (Signed)
Traumatic PSC cataract, also visualized on color fundus photography blocking the view of the posterior pole macular nerve OS than OD

## 2022-03-17 ENCOUNTER — Other Ambulatory Visit: Payer: Self-pay | Admitting: Physical Medicine & Rehabilitation

## 2022-03-17 ENCOUNTER — Telehealth: Payer: Self-pay | Admitting: Family Medicine

## 2022-03-17 DIAGNOSIS — G9341 Metabolic encephalopathy: Secondary | ICD-10-CM

## 2022-03-17 DIAGNOSIS — I10 Essential (primary) hypertension: Secondary | ICD-10-CM

## 2022-03-17 MED ORDER — HYDRALAZINE HCL 100 MG PO TABS: 100.0000 mg | ORAL_TABLET | Freq: Three times a day (TID) | ORAL | 3 refills | Status: DC

## 2022-03-17 NOTE — Telephone Encounter (Signed)
Caller Name: Jule Schlabach Call back phone #: [phone]  Reason for Call: Hydralazine has been prescribed to patient to take 3 times a day. He called in with frustrations of having to constantly refill because the prescription is being sent in with only a quantity of 60 (he runs out in 20days). He has gone the whole weekend without his med, is it possible to send to pharmacy with higher quantity?

## 2022-03-18 NOTE — Telephone Encounter (Signed)
Left patient a detailed voice message regarding annotation below.

## 2022-04-01 DIAGNOSIS — K91850 Pouchitis: Principal | ICD-10-CM

## 2022-04-02 ENCOUNTER — Other Ambulatory Visit: Payer: Self-pay | Admitting: Family Medicine

## 2022-04-02 DIAGNOSIS — I10 Essential (primary) hypertension: Secondary | ICD-10-CM

## 2022-04-10 ENCOUNTER — Telehealth: Payer: Self-pay | Admitting: Family Medicine

## 2022-04-10 NOTE — Telephone Encounter (Signed)
Caller Name: Landan Fedie Call back phone #: [phone]   MEDICATION(S):  Amlodipine  Days of Med Remaining: Ran out Sunday 10/222  Has the patient contacted their pharmacy (YES/NO)? Yes, they said they were sending out a message  What did pharmacy advise?   Preferred Pharmacy:  [facility] [place], [address]  Phone:  [phone]  Fax:  [phone]  ~~~Please advise patient/caregiver to allow 2-3 business days to process RX refills.

## 2022-04-10 NOTE — Telephone Encounter (Signed)
Contacted pharmacy and they stated that rx was received on 02/24/22 from our office and patient picked up rx on 02/28/22.   Contacted patient and advised him that we sent out refill of amlodipine on 02/24/2022. Patient looked through his medications and found the prescription.

## 2022-04-24 ENCOUNTER — Telehealth: Payer: Self-pay | Admitting: Family Medicine

## 2022-04-24 DIAGNOSIS — E782 Mixed hyperlipidemia: Secondary | ICD-10-CM

## 2022-04-24 DIAGNOSIS — K91850 Pouchitis: Principal | ICD-10-CM

## 2022-04-24 MED ORDER — EZETIMIBE 10 MG PO TABS: ORAL_TABLET | ORAL | 2 refills | Status: DC

## 2022-04-24 MED ORDER — CEFDINIR 300 MG CAPSULE
ORAL_CAPSULE | Freq: Two times a day (BID) | ORAL | 0 refills | 30 days | Status: CP
Start: 2022-04-24 — End: ?

## 2022-04-24 NOTE — Telephone Encounter (Signed)
Chart supports rx. Last OV: 02/24/2022 Next OV: 07/07/2022

## 2022-04-24 NOTE — Telephone Encounter (Signed)
Caller Name: pt Call back phone #: [phone]   MEDICATION(S):  ezetimibe (ZETIA) 10 MG tablet [[id number]]   Days of Med Remaining:   Has the patient contacted their pharmacy (YES/NO)? yes What did pharmacy advise? Has to be ordered  Preferred Pharmacy:  [facility] [place], [place] Phone: [phone]      ~~~Please advise patient/caregiver to allow 2-3 business days to process RX refills.

## 2022-04-25 ENCOUNTER — Other Ambulatory Visit: Payer: Self-pay | Admitting: Family Medicine

## 2022-04-25 DIAGNOSIS — E782 Mixed hyperlipidemia: Secondary | ICD-10-CM

## 2022-04-25 MED ORDER — LOPERAMIDE 2 MG CAPSULE
ORAL_CAPSULE | 2 refills | 0 days | Status: CP
Start: 2022-04-25 — End: ?

## 2022-05-06 ENCOUNTER — Emergency Department (HOSPITAL_COMMUNITY): Payer: Commercial Managed Care - HMO

## 2022-05-06 ENCOUNTER — Emergency Department (HOSPITAL_COMMUNITY)
Admission: EM | Admit: 2022-05-06 | Discharge: 2022-05-07 | Disposition: A | Discharge status: Home / Self Care | Payer: Commercial Managed Care - HMO | Attending: Emergency Medicine | Admitting: Emergency Medicine

## 2022-05-06 DIAGNOSIS — Z79899 Other long term (current) drug therapy: Secondary | ICD-10-CM | POA: Diagnosis not present

## 2022-05-06 DIAGNOSIS — Z8673 Personal history of transient ischemic attack (TIA), and cerebral infarction without residual deficits: Secondary | ICD-10-CM | POA: Insufficient documentation

## 2022-05-06 DIAGNOSIS — N182 Chronic kidney disease, stage 2 (mild): Secondary | ICD-10-CM | POA: Insufficient documentation

## 2022-05-06 DIAGNOSIS — Z9049 Acquired absence of other specified parts of digestive tract: Secondary | ICD-10-CM | POA: Insufficient documentation

## 2022-05-06 DIAGNOSIS — I129 Hypertensive chronic kidney disease with stage 1 through stage 4 chronic kidney disease, or unspecified chronic kidney disease: Secondary | ICD-10-CM | POA: Diagnosis not present

## 2022-05-06 DIAGNOSIS — R1031 Right lower quadrant pain: Secondary | ICD-10-CM | POA: Diagnosis not present

## 2022-05-06 DIAGNOSIS — R1032 Left lower quadrant pain: Secondary | ICD-10-CM | POA: Insufficient documentation

## 2022-05-06 DIAGNOSIS — R195 Other fecal abnormalities: Secondary | ICD-10-CM | POA: Insufficient documentation

## 2022-05-06 DIAGNOSIS — R1033 Periumbilical pain: Secondary | ICD-10-CM | POA: Diagnosis not present

## 2022-05-06 DIAGNOSIS — R1084 Generalized abdominal pain: Secondary | ICD-10-CM | POA: Insufficient documentation

## 2022-05-06 DIAGNOSIS — E1122 Type 2 diabetes mellitus with diabetic chronic kidney disease: Secondary | ICD-10-CM | POA: Diagnosis not present

## 2022-05-06 DIAGNOSIS — R110 Nausea: Secondary | ICD-10-CM | POA: Diagnosis not present

## 2022-05-06 LAB — CBC
HCT: 43.1 % (ref 39.0–52.0)
Hemoglobin: 13.2 g/dL (ref 13.0–17.0)
MCH: 29.6 pg (ref 26.0–34.0)
MCHC: 30.6 g/dL (ref 30.0–36.0)
MCV: 96.6 fL (ref 80.0–100.0)
Platelets: 203 10*3/uL (ref 150–400)
RBC: 4.46 MIL/uL (ref 4.22–5.81)
RDW: 15 % (ref 11.5–15.5)
WBC: 4.8 10*3/uL (ref 4.0–10.5)
nRBC: 0 % (ref 0.0–0.2)

## 2022-05-06 LAB — COMPREHENSIVE METABOLIC PANEL
ALT: 28 U/L (ref 0–44)
AST: 32 U/L (ref 15–41)
Albumin: 4.3 g/dL (ref 3.5–5.0)
Alkaline Phosphatase: 64 U/L (ref 38–126)
Anion gap: 9 (ref 5–15)
BUN: 13 mg/dL (ref 6–20)
CO2: 25 mmol/L (ref 22–32)
Calcium: 9.9 mg/dL (ref 8.9–10.3)
Chloride: 110 mmol/L (ref 98–111)
Creatinine, Ser: 1.36 mg/dL — ABNORMAL HIGH (ref 0.61–1.24)
GFR, Estimated: 60 mL/min — ABNORMAL LOW (ref >60)
Glucose, Bld: 107 mg/dL — ABNORMAL HIGH (ref 70–99)
Potassium: 4.8 mmol/L (ref 3.5–5.1)
Sodium: 144 mmol/L (ref 135–145)
Total Bilirubin: 0.8 mg/dL (ref 0.3–1.2)
Total Protein: 7.8 g/dL (ref 6.5–8.1)

## 2022-05-06 LAB — LIPASE, BLOOD: Lipase: 30 U/L (ref 11–51)

## 2022-05-06 NOTE — ED Provider Triage Note (Signed)
Emergency Medicine Provider Triage Evaluation Note  Matthew Francis , a 61 y.o. male  was evaluated in triage.  Pt complains of abdominal pain, bloating, diarrhea. Had some chicken wings about 5-6 days ago and taking his abx and UC meds as prescribed, without relief.   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 States symptoms were recurring for the next 1-2 months. Follows with GI at [facility], was on UC meds, cephalosporin and ciprofloxacin.   Review of Systems  Positive: Abd pain, nausea, vomiting, abdominal distention, diarrhea, minimal blood in stool Negative: Fevers, chills  Physical Exam  BP 128/82 (BP Location: Left Arm)   Pulse 65   Temp 98.9 F (37.2 C) (Oral)   Resp 18   Ht 5' 11"  (1.803 m)   Wt 95.3 kg   SpO2 96%   BMI 29.29 kg/m  Gen:   Awake, no distress   Resp:  Normal effort  MSK:   Moves extremities without difficulty  Other:    Medical Decision Making  Medically screening exam initiated at 5:09 PM.  Appropriate orders placed.  AMAN BONET was informed that the remainder of the evaluation will be completed by another provider, this initial triage assessment does not replace that evaluation, and the importance of remaining in the ED until their evaluation is complete.  Workup initiated   Kateri Plummer, PA-C 05/06/22 1712

## 2022-05-06 NOTE — ED Provider Notes (Incomplete)
[facility] DEPT Provider Note  CSN: [id number] Arrival date & time: 05/06/22 1519  Chief Complaint(s) Abdominal Pain  HPI Matthew Francis is a 61 y.o. male {Add pertinent medical, surgical, social history, OB history to HPI:1}    Abdominal Pain   Past Medical History Past Medical History:  Diagnosis Date  . Arthritis   . 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 02/09/2012  . GERD (gastroesophageal reflux disease)   . H/O ulcerative colitis   . Hyperlipidemia   . Hypertension   . ICH (intracerebral hemorrhage) ([place]) 12/11/2020  . Inguinal hernia   . Nontraumatic subcortical hemorrhage of brain ([place])   . Severe sepsis ([place]) 01/17/2020  . Thrombocytopenia ([place]) 01/17/2020  . Ulcer    ulcerative colitis  . Ulcerative colitis   . UTI due to extended-spectrum beta lactamase (ESBL) producing Escherichia coli 01/19/2020  . Wears glasses    Patient Active Problem List   Diagnosis Date Noted  . Vitreomacular adhesion of both eyes 03/10/2022  . Diabetes mellitus without complication ([place]) 38/46/6599  . Nuclear sclerotic cataract of both eyes 03/10/2022  . Posterior subcapsular age-related cataract, both eyes 03/10/2022  . Ulnar neuropathy at elbow of right upper extremity 01/29/2022  . Anemia 01/03/2022  . Lateral epicondylitis of left elbow 01/03/2022  . Hyperlipidemia 11/12/2021  . History of hemorrhagic cerebrovascular accident (CVA) with residual deficit 11/12/2021  . Muscle spasm 11/12/2021  . Gastroesophageal reflux disease 11/12/2021  . Acute right hip pain 11/12/2021  . Type 2 diabetes mellitus with stage 2 chronic kidney disease, without long-term current use of insulin ([place]) 11/12/2021  . Cocaine abuse ([place]) 11/12/2021  . Alcohol abuse 11/12/2021  . Recurrent major depressive disorder, in remission ([place]) 11/12/2021  . Anxiety 11/12/2021  . Vitamin D deficiency 11/12/2021  . B12 deficiency 11/12/2021  . Sleep disorder 04/03/2021   . Essential hypertension 01/17/2020  . Ulcerative colitis ([place]) 01/17/2020   Home Medication(s) Prior to Admission medications   Medication Sig Start Date End Date Taking? Authorizing Provider  acetaminophen (TYLENOL) 325 MG tablet Take 2 tablets (650 mg total) by mouth every 6 (six) hours as needed for moderate pain or mild pain. 12/31/20   Angiulli, Lavon Paganini, PA-C  amLODipine (NORVASC) 10 MG tablet Take 1 tablet (10 mg total) by mouth daily. 02/24/22   Bonnita Hollow, MD  ascorbic acid (VITAMIN C) 500 MG tablet Take 1 tablet (500 mg total) by mouth daily. 12/31/20   Angiulli, Lavon Paganini, PA-C  cetirizine (ZYRTEC) 10 MG tablet Take 10 mg by mouth daily as needed for allergies.     [provider]  Cholecalciferol (VITAMIN D3) 1.25 MG (50000 UT) CAPS TAKE 1 CAPSULE BY MOUTH EVERY [facility] 08/13/21   Meredith Staggers, MD  cloNIDine (CATAPRES) 0.1 MG tablet Take 1 tablet (0.1 mg total) by mouth 3 (three) times daily. 06/26/21   Meredith Staggers, MD  diclofenac Sodium (VOLTAREN) 1 % GEL Apply 4 g topically 4 (four) times daily as needed. 01/03/22   Bonnita Hollow, MD  dicyclomine (BENTYL) 10 MG capsule Take 10 mg by mouth every 6 (six) hours as needed. 05/13/21   [provider]  ezetimibe (ZETIA) 10 MG tablet TAKE 1 TABLET(10 MG) BY MOUTH DAILY 04/24/22   Bonnita Hollow, MD  famotidine (PEPCID) 40 MG tablet Take 1 tablet (40 mg total) by mouth daily. 02/28/22 05/29/22  Bonnita Hollow, MD  Glucosamine 500 MG CAPS Take by mouth.  [provider]  hydrALAZINE (APRESOLINE) 100 MG tablet Take 1 tablet (100 mg total) by mouth 3 (three) times daily. 03/17/22 03/12/23  Bonnita Hollow, MD  lisinopril (ZESTRIL) 5 MG tablet Take 1 tablet (5 mg total) by mouth daily. 02/24/22   Bonnita Hollow, MD  loperamide (IMODIUM) 2 MG capsule Take 2 capsules (4 mg total) by mouth as needed for diarrhea or loose stools. 12/31/20   Angiulli, Lavon Paganini, PA-C  melatonin 3 MG TABS tablet  Take 1 tablet (3 mg total) by mouth at bedtime. 12/31/20   Angiulli, Lavon Paganini, PA-C  Multiple Vitamin (MULTIVITAMIN WITH MINERALS) TABS Take 1 tablet by mouth daily.    [provider]  Omega-3 Fatty Acids (FISH OIL) 1000 MG CAPS Take by mouth.    [provider]  pantoprazole (PROTONIX) 40 MG tablet Take 1 tablet (40 mg total) by mouth daily. 02/28/22   Bonnita Hollow, MD  psyllium (METAMUCIL) 58.6 % packet Take 1 packet by mouth daily as needed (For fiber/thickener).     [provider]  QUEtiapine (SEROQUEL) 25 MG tablet TAKE 2 TABLETS(50 MG) BY MOUTH AT BEDTIME 02/10/22   Bonnita Hollow, MD  rifaximin (XIFAXAN) 550 MG TABS tablet Take 1 tablet (550 mg total) by mouth 2 (two) times daily. 01/30/21   Meredith Staggers, MD  rosuvastatin (CRESTOR) 5 MG tablet Take 1 tablet (5 mg total) by mouth daily. 01/03/22   Bonnita Hollow, MD  traZODone (DESYREL) 50 MG tablet Take 0.5-1 tablets (25-50 mg total) by mouth at bedtime as needed for sleep. 02/24/22   Bonnita Hollow, MD  Turmeric (QC TUMERIC COMPLEX PO) Take 1 tablet by mouth daily as needed (For inflammatory).    [provider]  vitamin B-12 (CYANOCOBALAMIN) 100 MCG tablet Take 1 tablet (100 mcg total) by mouth daily. 12/31/20   Angiulli, Lavon Paganini, PA-C                                                                                                                                    Allergies Asacol [mesalamine], Penicillins, Sulfa antibiotics, Atorvastatin, Bee pollen, Bee venom, and Iodinated contrast media  Review of Systems Review of Systems  Gastrointestinal:  Positive for abdominal pain.   As noted in HPI  Physical Exam Vital Signs  I have reviewed the triage vital signs BP (!) 143/91   Pulse 88   Temp 98.7 F (37.1 C) (Oral)   Resp 16   Ht 5' 11"  (1.803 m)   Wt 95.3 kg   SpO2 99%   BMI 29.29 kg/m  *** Physical Exam  ED Results and Treatments Labs (all labs ordered are listed, but  only abnormal results are displayed) Labs Reviewed  COMPREHENSIVE METABOLIC PANEL - Abnormal; Notable for the following components:      Result Value   Glucose, Bld 107 (*)    Creatinine, Ser 1.36 (*)  GFR, Estimated 60 (*)    All other components within normal limits  C DIFFICILE QUICK SCREEN W PCR REFLEX    GASTROINTESTINAL PANEL BY PCR, STOOL (REPLACES STOOL CULTURE)  LIPASE, BLOOD  CBC  URINALYSIS, ROUTINE W REFLEX MICROSCOPIC  OCCULT BLOOD X 1 CARD TO LAB, STOOL                                                                                                                         EKG  EKG Interpretation  Date/Time:    Ventricular Rate:    PR Interval:    QRS Duration:   QT Interval:    QTC Calculation:   R Axis:     Text Interpretation:         Radiology CT ABDOMEN PELVIS WO CONTRAST  Result Date: 05/06/2022 CLINICAL DATA:  Abdominal pain, acute, nonlocalized. Pt states he is having diffuse abdominal pain. States this started about 5 or 6 days ago along with diarrhea and nausea EXAM: CT ABDOMEN AND PELVIS WITHOUT CONTRAST TECHNIQUE: Multidetector CT imaging of the abdomen and pelvis was performed following the standard protocol without IV contrast. RADIATION DOSE REDUCTION: This exam was performed according to the departmental dose-optimization program which includes automated exposure control, adjustment of the mA and/or kV according to patient size and/or use of iterative reconstruction technique. COMPARISON:  CT abdomen pelvis 01/17/2020 FINDINGS: Lower chest: No acute abnormality. Hepatobiliary: No focal liver abnormality. No gallstones, gallbladder wall thickening, or pericholecystic fluid. No biliary dilatation. Pancreas: No focal lesion. Normal pancreatic contour. No surrounding inflammatory changes. No main pancreatic ductal dilatation. Spleen: Normal in size without focal abnormality. Adrenals/Urinary Tract: No adrenal nodule bilaterally. No nephrolithiasis and no  hydronephrosis. There is a 2 cm right renal hyperdense lesion with a density of 48 Hounsfield units. No ureterolithiasis or hydroureter. The urinary bladder is unremarkable. Stomach/Bowel: Similar-appearing large bowel resections. Stomach is within normal limits. No evidence of bowel wall thickening or dilatation. Vascular/Lymphatic: No abdominal aorta or iliac aneurysm. Mild atherosclerotic plaque of the aorta and its branches. No abdominal, pelvic, or inguinal lymphadenopathy. Reproductive: Prostate is unremarkable. Other: No intraperitoneal free fluid. No intraperitoneal free gas. No organized fluid collection. Musculoskeletal: No abdominal wall hernia or abnormality. No suspicious lytic or blastic osseous lesions. No acute displaced fracture. IMPRESSION: 1. No acute intra-abdominal or intrapelvic abnormality on this noncontrast study. 2. Indeterminate 2 cm right renal lesion. When the patient is clinically stable and able to follow directions and hold their breath (preferably as an outpatient) further evaluation with dedicated outpatient MRI renal protocol should be considered. 3. Atherosclerosis (ICD10-I70.0). Electronically Signed   By: Iven Finn M.D.   On: 05/06/2022 17:55    Medications Ordered in ED Medications - No data to display  Procedures Procedures  (including critical care time)  Medical Decision Making / ED Course   Medical Decision Making         Final Clinical Impression(s) / ED Diagnoses Final diagnoses:  None    {Document critical care time when appropriate:1}  {Document review of labs and clinical decision tools ie heart score, Chads2Vasc2 etc:1}  {Document your independent review of radiology images, and any outside records:1} {Document your discussion with family members, caretakers, and with consultants:1} {Document social  determinants of health affecting pt's care:1} {Document your decision making why or why not admission, treatments were needed:1} This chart was dictated using voice recognition software.  Despite best efforts to proofread,  errors can occur which can change the documentation meaning.

## 2022-05-06 NOTE — ED Triage Notes (Signed)
Pt states he is having diffuse abdominal pain. States this started about 5 or 6 days ago along with diarrhea and nausea.

## 2022-05-06 NOTE — ED Provider Notes (Signed)
[facility] DEPT Provider Note  CSN: [id number] Arrival date & time: 05/06/22 1519  Chief Complaint(s) Abdominal Pain  HPI Matthew Francis is a 61 y.o. male with extensive past medical history listed below including ulcerative colitis status post total colectomy and partial small bowel resection who presents to the emergency department for 5 days of generalized abdominal bloating and discomfort.  This began after he ate chicken wings.  He endorses looser stools.  Nausea without emesis.  He was concerned he had Salmonella infection.  Patient reports that 3 months ago he had Salmonella infection with reactive arthritis.  Reports that he had leftover antibiotics including cephalosporin, Cipro and rifaximin.  Reports that he took a few doses of this and reports feeling better.  He still having some mild discomfort which prompted his visit today.  The history is provided by the patient.    Past Medical History Past Medical History:  Diagnosis Date   Arthritis    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 02/09/2012   GERD (gastroesophageal reflux disease)    H/O ulcerative colitis    Hyperlipidemia    Hypertension    ICH (intracerebral hemorrhage) ([place]) 12/11/2020   Inguinal hernia    Nontraumatic subcortical hemorrhage of brain (HCC)    Severe sepsis ([place]) 01/17/2020   Thrombocytopenia ([place]) 01/17/2020   Ulcer    ulcerative colitis   Ulcerative colitis    UTI due to extended-spectrum beta lactamase (ESBL) producing Escherichia coli 01/19/2020   Wears glasses    Patient Active Problem List   Diagnosis Date Noted   Vitreomacular adhesion of both eyes 03/10/2022   Diabetes mellitus without complication ([place]) 01/60/1093   Nuclear sclerotic cataract of both eyes 03/10/2022   Posterior subcapsular age-related cataract, both eyes 03/10/2022   Ulnar neuropathy at elbow of right upper extremity 01/29/2022   Anemia 01/03/2022   Lateral epicondylitis of left  elbow 01/03/2022   Hyperlipidemia 11/12/2021   History of hemorrhagic cerebrovascular accident (CVA) with residual deficit 11/12/2021   Muscle spasm 11/12/2021   Gastroesophageal reflux disease 11/12/2021   Acute right hip pain 11/12/2021   Type 2 diabetes mellitus with stage 2 chronic kidney disease, without long-term current use of insulin ([place]) 11/12/2021   Cocaine abuse ([place]) 11/12/2021   Alcohol abuse 11/12/2021   Recurrent major depressive disorder, in remission ([place]) 11/12/2021   Anxiety 11/12/2021   Vitamin D deficiency 11/12/2021   B12 deficiency 11/12/2021   Sleep disorder 04/03/2021   Essential hypertension 01/17/2020   Ulcerative colitis ([place]) 01/17/2020   Home Medication(s) Prior to Admission medications   Medication Sig Start Date End Date Taking? Authorizing Provider  acetaminophen (TYLENOL) 325 MG tablet Take 2 tablets (650 mg total) by mouth every 6 (six) hours as needed for moderate pain or mild pain. 12/31/20   Angiulli, Lavon Paganini, PA-C  amLODipine (NORVASC) 10 MG tablet Take 1 tablet (10 mg total) by mouth daily. 02/24/22   Bonnita Hollow, MD  ascorbic acid (VITAMIN C) 500 MG tablet Take 1 tablet (500 mg total) by mouth daily. 12/31/20   Angiulli, Lavon Paganini, PA-C  cetirizine (ZYRTEC) 10 MG tablet Take 10 mg by mouth daily as needed for allergies.     [provider]  Cholecalciferol (VITAMIN D3) 1.25 MG (50000 UT) CAPS TAKE 1 CAPSULE BY MOUTH EVERY [facility] 08/13/21   Meredith Staggers, MD  cloNIDine (CATAPRES) 0.1 MG tablet Take 1 tablet (0.1 mg total) by mouth 3 (three) times daily. 06/26/21  Meredith Staggers, MD  diclofenac Sodium (VOLTAREN) 1 % GEL Apply 4 g topically 4 (four) times daily as needed. 01/03/22   Bonnita Hollow, MD  dicyclomine (BENTYL) 10 MG capsule Take 10 mg by mouth every 6 (six) hours as needed. 05/13/21   [provider]  ezetimibe (ZETIA) 10 MG tablet TAKE 1 TABLET(10 MG) BY MOUTH DAILY 04/24/22   Bonnita Hollow, MD   famotidine (PEPCID) 40 MG tablet Take 1 tablet (40 mg total) by mouth daily. 02/28/22 05/29/22  Bonnita Hollow, MD  Glucosamine 500 MG CAPS Take by mouth.    [provider]  hydrALAZINE (APRESOLINE) 100 MG tablet Take 1 tablet (100 mg total) by mouth 3 (three) times daily. 03/17/22 03/12/23  Bonnita Hollow, MD  lisinopril (ZESTRIL) 5 MG tablet Take 1 tablet (5 mg total) by mouth daily. 02/24/22   Bonnita Hollow, MD  loperamide (IMODIUM) 2 MG capsule Take 2 capsules (4 mg total) by mouth as needed for diarrhea or loose stools. 12/31/20   Angiulli, Lavon Paganini, PA-C  melatonin 3 MG TABS tablet Take 1 tablet (3 mg total) by mouth at bedtime. 12/31/20   Angiulli, Lavon Paganini, PA-C  Multiple Vitamin (MULTIVITAMIN WITH MINERALS) TABS Take 1 tablet by mouth daily.    [provider]  Omega-3 Fatty Acids (FISH OIL) 1000 MG CAPS Take by mouth.    [provider]  pantoprazole (PROTONIX) 40 MG tablet Take 1 tablet (40 mg total) by mouth daily. 02/28/22   Bonnita Hollow, MD  psyllium (METAMUCIL) 58.6 % packet Take 1 packet by mouth daily as needed (For fiber/thickener).     [provider]  QUEtiapine (SEROQUEL) 25 MG tablet TAKE 2 TABLETS(50 MG) BY MOUTH AT BEDTIME 02/10/22   Bonnita Hollow, MD  rifaximin (XIFAXAN) 550 MG TABS tablet Take 1 tablet (550 mg total) by mouth 2 (two) times daily. 01/30/21   Meredith Staggers, MD  rosuvastatin (CRESTOR) 5 MG tablet Take 1 tablet (5 mg total) by mouth daily. 01/03/22   Bonnita Hollow, MD  traZODone (DESYREL) 50 MG tablet Take 0.5-1 tablets (25-50 mg total) by mouth at bedtime as needed for sleep. 02/24/22   Bonnita Hollow, MD  Turmeric (QC TUMERIC COMPLEX PO) Take 1 tablet by mouth daily as needed (For inflammatory).    [provider]  vitamin B-12 (CYANOCOBALAMIN) 100 MCG tablet Take 1 tablet (100 mcg total) by mouth daily. 12/31/20   Angiulli, Lavon Paganini, PA-C                                                                                                                                     Allergies Asacol [mesalamine], Penicillins, Sulfa antibiotics, Atorvastatin, Bee pollen, Bee venom, and Iodinated contrast media  Review of Systems Review of Systems As noted in HPI  Physical Exam Vital Signs  I have reviewed the triage vital signs  BP (!) 137/97   Pulse 70   Temp 98.7 F (37.1 C) (Oral)   Resp 16   Ht 5' 11"  (1.803 m)   Wt 95.3 kg   SpO2 99%   BMI 29.29 kg/m   Physical Exam Vitals reviewed.  Constitutional:      General: He is not in acute distress.    Appearance: He is well-developed. He is not diaphoretic.  HENT:     Head: Normocephalic and atraumatic.     Right Ear: External ear normal.     Left Ear: External ear normal.     Nose: Nose normal.     Mouth/Throat:     Mouth: Mucous membranes are moist.  Eyes:     General: No scleral icterus.    Conjunctiva/sclera: Conjunctivae normal.  Neck:     Trachea: Phonation normal.  Cardiovascular:     Rate and Rhythm: Normal rate and regular rhythm.  Pulmonary:     Effort: Pulmonary effort is normal. No respiratory distress.     Breath sounds: No stridor.  Abdominal:     General: There is no distension.     Tenderness: There is abdominal tenderness (mild) in the right lower quadrant, periumbilical area and left lower quadrant.     Hernia: No hernia is present.     Comments: Multiple remote surgical scars  Musculoskeletal:        General: Normal range of motion.     Cervical back: Normal range of motion.  Neurological:     Mental Status: He is alert and oriented to person, place, and time.  Psychiatric:        Behavior: Behavior normal.     ED Results and Treatments Labs (all labs ordered are listed, but only abnormal results are displayed) Labs Reviewed  COMPREHENSIVE METABOLIC PANEL - Abnormal; Notable for the following components:      Result Value   Glucose, Bld 107 (*)    Creatinine, Ser 1.36 (*)    GFR, Estimated  60 (*)    All other components within normal limits  GASTROINTESTINAL PANEL BY PCR, STOOL (REPLACES STOOL CULTURE)  LIPASE, BLOOD  CBC  URINALYSIS, ROUTINE W REFLEX MICROSCOPIC  OCCULT BLOOD X 1 CARD TO LAB, STOOL                                                                                                                         EKG  EKG Interpretation  Date/Time:    Ventricular Rate:    PR Interval:    QRS Duration:   QT Interval:    QTC Calculation:   R Axis:     Text Interpretation:         Radiology CT ABDOMEN PELVIS WO CONTRAST  Result Date: 05/06/2022 CLINICAL DATA:  Abdominal pain, acute, nonlocalized. Pt states he is having diffuse abdominal pain. States this started about 5 or 6 days ago along with diarrhea and nausea EXAM: CT ABDOMEN AND PELVIS WITHOUT CONTRAST TECHNIQUE: Multidetector  CT imaging of the abdomen and pelvis was performed following the standard protocol without IV contrast. RADIATION DOSE REDUCTION: This exam was performed according to the departmental dose-optimization program which includes automated exposure control, adjustment of the mA and/or kV according to patient size and/or use of iterative reconstruction technique. COMPARISON:  CT abdomen pelvis 01/17/2020 FINDINGS: Lower chest: No acute abnormality. Hepatobiliary: No focal liver abnormality. No gallstones, gallbladder wall thickening, or pericholecystic fluid. No biliary dilatation. Pancreas: No focal lesion. Normal pancreatic contour. No surrounding inflammatory changes. No main pancreatic ductal dilatation. Spleen: Normal in size without focal abnormality. Adrenals/Urinary Tract: No adrenal nodule bilaterally. No nephrolithiasis and no hydronephrosis. There is a 2 cm right renal hyperdense lesion with a density of 48 Hounsfield units. No ureterolithiasis or hydroureter. The urinary bladder is unremarkable. Stomach/Bowel: Similar-appearing large bowel resections. Stomach is within normal limits. No  evidence of bowel wall thickening or dilatation. Vascular/Lymphatic: No abdominal aorta or iliac aneurysm. Mild atherosclerotic plaque of the aorta and its branches. No abdominal, pelvic, or inguinal lymphadenopathy. Reproductive: Prostate is unremarkable. Other: No intraperitoneal free fluid. No intraperitoneal free gas. No organized fluid collection. Musculoskeletal: No abdominal wall hernia or abnormality. No suspicious lytic or blastic osseous lesions. No acute displaced fracture. IMPRESSION: 1. No acute intra-abdominal or intrapelvic abnormality on this noncontrast study. 2. Indeterminate 2 cm right renal lesion. When the patient is clinically stable and able to follow directions and hold their breath (preferably as an outpatient) further evaluation with dedicated outpatient MRI renal protocol should be considered. 3. Atherosclerosis (ICD10-I70.0). Electronically Signed   By: Iven Finn M.D.   On: 05/06/2022 17:55    Medications Ordered in ED Medications  ketorolac (TORADOL) injection 30 mg (has no administration in time range)                                                                                                                                     Procedures Procedures  (including critical care time)  Medical Decision Making / ED Course   Medical Decision Making Amount and/or Complexity of Data Reviewed Labs: ordered. Decision-making details documented in ED Course. Radiology: ordered and independent interpretation performed. Decision-making details documented in ED Course.  Risk Prescription drug management. Decision regarding hospitalization.    Patient presents with abdominal pain Differential includes gastroenteritis, food poisoning,, UC flare, SBO.  We will also assess for other infectious/inflammatory intra-abdominal processes.  CBC without leukocytosis or anemia CMP without significant electrolyte derangements.  Stable renal function.  No evidence of bili  obstruction or pancreatitis. CT scan negative for inflammatory bowel changes or bowel obstruction. Incidental 2 cm right renal lesion noted.  Patient informed and recommended outpatient follow-up.        Final Clinical Impression(s) / ED Diagnoses Final diagnoses:  Generalized abdominal pain   The patient appears reasonably screened and/or stabilized for discharge and I doubt any other medical condition or other [facility] requiring further  screening, evaluation, or treatment in the ED at this time. I have discussed the findings, Dx and Tx plan with the patient/family who expressed understanding and agree(s) with the plan. Discharge instructions discussed at length. The patient/family was given strict return precautions who verbalized understanding of the instructions. No further questions at time of discharge.  Disposition: Discharge  Condition: Good  ED Discharge Orders     None       Follow Up: Bonnita Hollow, [address] [place] [id number] [phone]  Call  to schedule an appointment for close follow up            This chart was dictated using voice recognition software.  Despite best efforts to proofread,  errors can occur which can change the documentation meaning.    Fatima Blank, MD 05/07/22 [phone]

## 2022-05-07 ENCOUNTER — Telehealth: Payer: Self-pay

## 2022-05-07 MED ORDER — KETOROLAC TROMETHAMINE 60 MG/2ML IM SOLN
30.0000 mg | Freq: Once | INTRAMUSCULAR | Status: AC
  Administered 2022-05-07: 30 mg via INTRAMUSCULAR
  Filled 2022-05-07: qty 2

## 2022-05-07 NOTE — Discharge Instructions (Addendum)
During the workup we noted incidental findings on your imaging that would require you to follow-up with your regular doctor for further evaluation/management: Indeterminate 2 cm right renal lesion. When the patient is  clinically stable and able to follow directions and hold their  breath (preferably as an outpatient) further evaluation with  dedicated outpatient MRI renal protocol should be considered.

## 2022-05-07 NOTE — Telephone Encounter (Addendum)
Transition Care Management Follow-up Telephone Call Date of discharge and from where: 05/07/22 [facility] ED. Dx: Generalized abdominal pain How have you been since you were released from the hospital? I'm doing ok  Any questions or concerns? Yes, CT scan revealed a lesion on kidney. Wants to know if he needs a referral.  Items Reviewed: Did the pt receive and understand the discharge instructions provided? Yes  Medications obtained and verified? Yes  Other? No  Any new allergies since your discharge? No  Dietary orders reviewed? No Do you have support at home? Yes   Home Care and Equipment/Supplies: Were home health services ordered? not applicable If so, what is the name of the agency? N/a  Has the agency set up a time to come to the patient's home? not applicable Were any new equipment or medical supplies ordered?  No What is the name of the medical supply agency? N/a Were you able to get the supplies/equipment? not applicable Do you have any questions related to the use of the equipment or supplies? No  Functional Questionnaire: (I = Independent and D = Dependent) ADLs: HI  Bathing/Dressing- I  Meal Prep- I  Eating- I  Maintaining continence- I  Transferring/Ambulation- I  Managing Meds- I  Follow up appointments reviewed:  PCP Hospital f/u appt confirmed? Yes  Scheduled to see Wilfred Lacy on 05/13/22 @ 11:00am. [facility] f/u appt confirmed? No  Scheduled to see n/a on n/a @ n/a. Are transportation arrangements needed? Yes  If their condition worsens, is the pt aware to call PCP or go to the Emergency Dept.? Yes Was the patient provided with contact information for the PCP's office or ED? Yes Was to pt encouraged to call back with questions or concerns? Yes  Angeline Slim, RN, BSN RN Clinical Supervisor [facility] [employer]

## 2022-05-11 ENCOUNTER — Other Ambulatory Visit: Payer: Self-pay | Admitting: Family Medicine

## 2022-05-11 DIAGNOSIS — K219 Gastro-esophageal reflux disease without esophagitis: Secondary | ICD-10-CM

## 2022-05-12 NOTE — Telephone Encounter (Signed)
Chart supports rx. Last OV: 02/24/2022 Next OV: 05/12/2022

## 2022-05-13 ENCOUNTER — Encounter: Payer: Self-pay | Admitting: Family Medicine

## 2022-05-13 ENCOUNTER — Ambulatory Visit: Payer: Commercial Managed Care - HMO | Admitting: Family Medicine

## 2022-05-13 VITALS — BP 124/86 | HR 80 | Temp 97.8°F | Wt 206.8 lb

## 2022-05-13 DIAGNOSIS — E1122 Type 2 diabetes mellitus with diabetic chronic kidney disease: Secondary | ICD-10-CM | POA: Diagnosis not present

## 2022-05-13 DIAGNOSIS — G479 Sleep disorder, unspecified: Secondary | ICD-10-CM | POA: Diagnosis not present

## 2022-05-13 DIAGNOSIS — N2889 Other specified disorders of kidney and ureter: Secondary | ICD-10-CM | POA: Diagnosis not present

## 2022-05-13 DIAGNOSIS — N182 Chronic kidney disease, stage 2 (mild): Secondary | ICD-10-CM

## 2022-05-13 DIAGNOSIS — N289 Disorder of kidney and ureter, unspecified: Secondary | ICD-10-CM

## 2022-05-13 MED ORDER — QUETIAPINE FUMARATE 25 MG PO TABS: 25.0000 mg | ORAL_TABLET | Freq: Every day | ORAL | 0 refills | Status: DC

## 2022-05-13 NOTE — Patient Instructions (Addendum)
For insomnia, stop trazodone. Start quetiapine.  For kidney lesion, we are ordering MRI. Office will call to schedule.

## 2022-05-15 ENCOUNTER — Telehealth: Payer: Self-pay

## 2022-05-15 NOTE — Telephone Encounter (Signed)
Per [employer] [facility] "Can the MRI order be moved from [facility] center to [facility]

## 2022-05-18 DIAGNOSIS — N289 Disorder of kidney and ureter, unspecified: Secondary | ICD-10-CM | POA: Insufficient documentation

## 2022-05-18 NOTE — Assessment & Plan Note (Signed)
Was controlled, but hemoglobin A1c was trending upward Recheck

## 2022-05-18 NOTE — Assessment & Plan Note (Signed)
Previously on trazodone, however not working well Cautiously retrial quetiapine monitor hemoglobin A1c

## 2022-05-18 NOTE — Assessment & Plan Note (Signed)
Found incidentally on CT abdomen pelvis Order MRI to further evaluate

## 2022-05-18 NOTE — Progress Notes (Signed)
Assessment/Plan:   Problem List Items Addressed This Visit       Endocrine   Type 2 diabetes mellitus with stage 2 chronic kidney disease, without long-term current use of insulin ([place]) - Primary    Was controlled, but hemoglobin A1c was trending upward Recheck      Relevant Orders   Hemoglobin A1C     Genitourinary   Renal lesion    Found incidentally on CT abdomen pelvis Order MRI to further evaluate        Other   Sleep disorder    Previously on trazodone, however not working well Cautiously retrial quetiapine monitor hemoglobin A1c      Relevant Medications   QUEtiapine (SEROQUEL) 25 MG tablet   Other Visit Diagnoses     Other specified disorders of kidney and ureter       Relevant Orders   MR ABDOMEN W WO CONTRAST          Subjective:  HPI:  Matthew Francis is a 61 y.o. male who has Essential hypertension; Ulcerative colitis ([place]); Sleep disorder; Hyperlipidemia; History of hemorrhagic cerebrovascular accident (CVA) with residual deficit; Muscle spasm; Gastroesophageal reflux disease; Acute right hip pain; Type 2 diabetes mellitus with stage 2 chronic kidney disease, without long-term current use of insulin ([place]); Cocaine abuse ([place]); Alcohol abuse; Recurrent major depressive disorder, in remission ([place]); Anxiety; Vitamin D deficiency; B12 deficiency; Anemia; Lateral epicondylitis of left elbow; Ulnar neuropathy at elbow of right upper extremity; Vitreomacular adhesion of both eyes; Diabetes mellitus without complication ([place]); Nuclear sclerotic cataract of both eyes; Posterior subcapsular age-related cataract, both eyes; and Renal lesion on their problem list..   He  has a past medical history of Arthritis, 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 (02/09/2012), GERD (gastroesophageal reflux disease), H/O ulcerative colitis, Hyperlipidemia, Hypertension, ICH (intracerebral hemorrhage) ([place]) (12/11/2020), Inguinal hernia, Nontraumatic subcortical  hemorrhage of brain ([place]), Severe sepsis ([place]) (01/17/2020), Thrombocytopenia ([place]) (01/17/2020), Ulcer, Ulcerative colitis, UTI due to extended-spectrum beta lactamase (ESBL) producing Escherichia coli (01/19/2020), and Wears glasses.Marland Kitchen   He presents with chief complaint of Hospitalization Follow-up (Abdominal Pain. Patient reports he is feeling better) .   Chronic abdominal pain.  His abdominal pain is improved.  He is not having nausea or vomiting.  He went to the emergency department follow-up.  Patient was seen in 05/06/2022.  Patient had CBC, CMP that were without significant derangement.  CT abdomen pelvis was negative for acute intra-abdominal process.  Did have incidental finding of a 2 cm renal lesion.  Right renal lesion.  Incidentally found on CT abdomen pelvis recently.  Patient with CT abdomen pelvis in 01/2020 that did not show this lesion.  Patient does not have any urinary symptoms.  Insomnia.  Patient has history of insomnia.  Has been on Seroquel in the past.  This was discontinued due to history of diabetes.  He has trialed trazodone which has helped intermittently in the past, however he he is reporting that he was working last.  He is interested in trialing quetiapine again.  DIABETES Type II, established problem,  Medications: Diet controlled,.  Interim History-   ROS: Denies Polyuria,Polydipsia, Denies  Hypoglycemia symptoms (palpitations, tremors, anxiousness)   HGBA1C Lab Results  Component Value Date   HGBA1C 6.6 (H) 01/03/2022   HGBA1C 5.9 (H) 12/12/2020    Lipid Panel Lab Results  Component Value Date   CHOL 142 12/26/2021   TRIG 100 12/26/2021   HDL 66 12/26/2021   LDLCALC 58 12/26/2021  Renal Function Lab Results  Component Value Date   CREATININE 1.36 (H) 05/06/2022   GFR 54.43 (L) 11/21/2021   MICROALBUR 1.4 02/24/2022   LABCREAU 182.3 02/24/2022   MICRALBCREAT 0.7 02/24/2022     Past Surgical History:  Procedure Laterality Date    APPENDECTOMY     COLECTOMY     Proctocolectomy with IPAA   COLECTOMY     FOREIGN BODY REMOVAL ABDOMINAL  02/17/2012   Procedure: REMOVAL FOREIGN BODY ABDOMINAL;  Surgeon: Odis Hollingshead, MD;  Location: [facility];  Service: General;  Laterality: N/A;  abdominal wound exploration and removal of foreign body   HERNIA REPAIR     Multiple incisional hernias.   ileoanal anastomosis      Outpatient Medications Prior to Visit  Medication Sig Dispense Refill   acetaminophen (TYLENOL) 325 MG tablet Take 2 tablets (650 mg total) by mouth every 6 (six) hours as needed for moderate pain or mild pain.     amLODipine (NORVASC) 10 MG tablet Take 1 tablet (10 mg total) by mouth daily. 30 tablet 2   ascorbic acid (VITAMIN C) 500 MG tablet Take 1 tablet (500 mg total) by mouth daily. 30 tablet 0   cetirizine (ZYRTEC) 10 MG tablet Take 10 mg by mouth daily as needed for allergies.      Cholecalciferol (VITAMIN D3) 1.25 MG (50000 UT) CAPS TAKE 1 CAPSULE BY MOUTH EVERY WEDNESDAY 4 capsule 5   cloNIDine (CATAPRES) 0.1 MG tablet Take 1 tablet (0.1 mg total) by mouth 3 (three) times daily. 90 tablet 11   diclofenac Sodium (VOLTAREN) 1 % GEL Apply 4 g topically 4 (four) times daily as needed. 100 g 3   dicyclomine (BENTYL) 10 MG capsule Take 10 mg by mouth every 6 (six) hours as needed.     ezetimibe (ZETIA) 10 MG tablet TAKE 1 TABLET(10 MG) BY MOUTH DAILY 30 tablet 2   famotidine (PEPCID) 40 MG tablet TAKE 1 TABLET(40 MG) BY MOUTH DAILY 90 tablet 0   Glucosamine 500 MG CAPS Take by mouth.     hydrALAZINE (APRESOLINE) 100 MG tablet Take 1 tablet (100 mg total) by mouth 3 (three) times daily. 270 tablet 3   lisinopril (ZESTRIL) 5 MG tablet Take 1 tablet (5 mg total) by mouth daily. 90 tablet 3   loperamide (IMODIUM) 2 MG capsule Take 2 capsules (4 mg total) by mouth as needed for diarrhea or loose stools. 30 capsule 0   melatonin 3 MG TABS tablet Take 1 tablet (3 mg total) by mouth at bedtime. 30  tablet 0   Multiple Vitamin (MULTIVITAMIN WITH MINERALS) TABS Take 1 tablet by mouth daily.     Omega-3 Fatty Acids (FISH OIL) 1000 MG CAPS Take by mouth.     pantoprazole (PROTONIX) 40 MG tablet Take 1 tablet (40 mg total) by mouth daily. 90 tablet 0   psyllium (METAMUCIL) 58.6 % packet Take 1 packet by mouth daily as needed (For fiber/thickener).      rifaximin (XIFAXAN) 550 MG TABS tablet Take 1 tablet (550 mg total) by mouth 2 (two) times daily. 42 tablet 4   rosuvastatin (CRESTOR) 5 MG tablet Take 1 tablet (5 mg total) by mouth daily. 30 tablet 5   Turmeric (QC TUMERIC COMPLEX PO) Take 1 tablet by mouth daily as needed (For inflammatory).     UNABLE TO FIND Med Name: IVERMETIN paste reported by patient OTC     vitamin B-12 (CYANOCOBALAMIN) 100 MCG tablet Take 1 tablet (  100 mcg total) by mouth daily. 30 tablet 0   QUEtiapine (SEROQUEL) 25 MG tablet TAKE 2 TABLETS(50 MG) BY MOUTH AT BEDTIME 60 tablet 2   traZODone (DESYREL) 50 MG tablet Take 0.5-1 tablets (25-50 mg total) by mouth at bedtime as needed for sleep. 30 tablet 3   No facility-administered medications prior to visit.    Family History  Problem Relation Age of Onset   Neurodegenerative disease Mother    Cancer Father        prostate cancer   Cancer Sister    Diabetes Paternal Grandmother    Stroke Paternal Grandfather     Social History   Socioeconomic History   Marital status: Single    Spouse name: Not on file   Number of children: Not on file   Years of education: Not on file   Highest education level: Not on file  Occupational History    Comment: restaurant   Occupation: [employer]  Tobacco Use   Smoking status: Never   Smokeless tobacco: Never  Vaping Use   Vaping Use: Never used  Substance and Sexual Activity   Alcohol use: Yes    Alcohol/week: 21.0 standard drinks of alcohol    Types: 21 Cans of beer per week   Drug use: Not Currently    Types: Cocaine   Sexual activity: Not Currently  Other  Topics Concern   Not on file  Social History Narrative   Not on file   Social Determinants of Health   Financial Resource Strain: Not on file  Food Insecurity: Not on file  Transportation Needs: Not on file  Physical Activity: Not on file  Stress: Not on file  Social Connections: Not on file  Intimate Partner Violence: Not on file                                                                                                 Objective:  Physical Exam: BP 124/86 (BP Location: Left Arm, Patient Position: Sitting, Cuff Size: Large)   Pulse 80   Temp 97.8 F (36.6 C) (Temporal)   Wt 206 lb 12.8 oz (93.8 kg)   SpO2 98%   BMI 28.84 kg/m    General: No acute distress. Awake and conversant.  Eyes: Normal conjunctiva, anicteric. Round symmetric pupils.  ENT: Hearing grossly intact. No nasal discharge.  Neck: Neck is supple. No masses or thyromegaly.  Respiratory: Respirations are non-labored. No auditory wheezing.  Skin: Warm. No rashes or ulcers.  Psych: Alert and oriented. Cooperative, Appropriate mood and affect, Normal judgment.  CV: No cyanosis or JVD ABD: Nontender nondistended MSK: Normal ambulation. No clubbing  Neuro: Sensation and CN II-XII grossly normal.        Alesia Banda, MD, MS

## 2022-05-19 ENCOUNTER — Other Ambulatory Visit (INDEPENDENT_AMBULATORY_CARE_PROVIDER_SITE_OTHER): Payer: Commercial Managed Care - HMO

## 2022-05-19 DIAGNOSIS — E1122 Type 2 diabetes mellitus with diabetic chronic kidney disease: Secondary | ICD-10-CM

## 2022-05-19 DIAGNOSIS — N182 Chronic kidney disease, stage 2 (mild): Secondary | ICD-10-CM | POA: Diagnosis not present

## 2022-05-19 LAB — HEMOGLOBIN A1C: Hgb A1c MFr Bld: 6.5 % (ref 4.6–6.5)

## 2022-05-20 ENCOUNTER — Other Ambulatory Visit: Payer: Commercial Managed Care - HMO

## 2022-05-23 MED ORDER — DICYCLOMINE 10 MG CAPSULE
ORAL_CAPSULE | 5 refills | 0 days | Status: CP
Start: 2022-05-23 — End: ?

## 2022-05-26 ENCOUNTER — Ambulatory Visit: Payer: Commercial Managed Care - HMO

## 2022-05-26 DIAGNOSIS — N2889 Other specified disorders of kidney and ureter: Secondary | ICD-10-CM

## 2022-05-26 DIAGNOSIS — N289 Disorder of kidney and ureter, unspecified: Secondary | ICD-10-CM

## 2022-05-26 MED ORDER — GADOBUTROL 1 MMOL/ML IV SOLN
10.0000 mL | Freq: Once | INTRAVENOUS | Status: AC | PRN
  Administered 2022-05-26: 10 mL via INTRAVENOUS

## 2022-05-30 ENCOUNTER — Telehealth: Payer: Self-pay | Admitting: Family Medicine

## 2022-05-30 DIAGNOSIS — N289 Disorder of kidney and ureter, unspecified: Secondary | ICD-10-CM

## 2022-05-30 NOTE — Telephone Encounter (Signed)
Caller Name: Kastiel Simonian Call back phone #: [phone]  Reason for Call: Pt came in to check on status of urologist referral. It has been a week now with no update and referral looks to still be in pending status. Pt was also wanting to have it changed to a more convenient location in [place]. Shows that it is currently directed to the [facility] in [place].

## 2022-05-30 NOTE — Telephone Encounter (Signed)
Sherri - I updated the referral. It doesn't look like this was in the [facility] as the referred by was showing [facility] instead of [facility]. I have corrected so you are able to route to urology.

## 2022-06-02 NOTE — Telephone Encounter (Signed)
Referral was sent on Friday to [facility]. A letter was sent to the patient Via Mychart with address & phone # and follow up instructions.

## 2022-06-03 ENCOUNTER — Telehealth: Payer: Self-pay | Admitting: Family Medicine

## 2022-06-03 NOTE — Telephone Encounter (Signed)
done

## 2022-06-03 NOTE — Telephone Encounter (Signed)
Pt is requesting a CD of the imaging he had

## 2022-06-03 NOTE — Addendum Note (Signed)
Addended by: Josephine Igo B on: 06/03/2022 03:21 PM   Modules accepted: Orders

## 2022-06-03 NOTE — Telephone Encounter (Signed)
Caller Name: Prince Couey Call back phone #: [phone]  Reason for Call: Pt asked for referral to be transferred to Dr. Durward Parcel out of [place], [place].

## 2022-06-03 NOTE — Telephone Encounter (Signed)
Advised patient that we aren't able to produce a CD, but we can print off the imaging. Patient agreed to printed copy. Placed up front for pick up

## 2022-06-10 ENCOUNTER — Telehealth: Payer: Self-pay

## 2022-06-10 ENCOUNTER — Other Ambulatory Visit: Payer: Self-pay | Admitting: Physical Medicine & Rehabilitation

## 2022-06-10 DIAGNOSIS — G9341 Metabolic encephalopathy: Secondary | ICD-10-CM

## 2022-06-10 NOTE — Telephone Encounter (Signed)
Received rx refill request from [facility] for Trazodone 50 mg. Please advise.

## 2022-06-11 NOTE — Telephone Encounter (Signed)
Patient states that "Seroquel isn't making a difference" with his symptoms. I advised patient that Dr. Grandville Silos isn't available until Friday and he was ok with waiting on his recommendations.

## 2022-06-11 NOTE — Telephone Encounter (Signed)
Left patient a detailed voice message to return call to office.

## 2022-06-11 NOTE — Telephone Encounter (Signed)
Please attempt to cb

## 2022-06-13 NOTE — Telephone Encounter (Signed)
Patient is aware of annotation below and verbalized understanding.

## 2022-06-30 ENCOUNTER — Telehealth: Payer: Self-pay | Admitting: Family Medicine

## 2022-06-30 DIAGNOSIS — K219 Gastro-esophageal reflux disease without esophagitis: Secondary | ICD-10-CM

## 2022-06-30 MED ORDER — PANTOPRAZOLE SODIUM 40 MG PO TBEC: 40.0000 mg | DELAYED_RELEASE_TABLET | Freq: Every day | ORAL | 0 refills | Status: DC

## 2022-07-02 DIAGNOSIS — K580 Irritable bowel syndrome with diarrhea: Principal | ICD-10-CM

## 2022-07-07 ENCOUNTER — Telehealth: Payer: Self-pay | Admitting: Family Medicine

## 2022-07-07 ENCOUNTER — Ambulatory Visit: Payer: BLUE CROSS/BLUE SHIELD | Admitting: Family Medicine

## 2022-07-07 NOTE — Telephone Encounter (Signed)
I took pt off the [facility], he has [facility] local.

## 2022-07-08 NOTE — Telephone Encounter (Signed)
Pt trying to change plan before March appt

## 2022-08-04 ENCOUNTER — Other Ambulatory Visit: Payer: Self-pay | Admitting: Family Medicine

## 2022-08-04 DIAGNOSIS — G479 Sleep disorder, unspecified: Secondary | ICD-10-CM

## 2022-08-04 DIAGNOSIS — K219 Gastro-esophageal reflux disease without esophagitis: Secondary | ICD-10-CM

## 2022-08-04 NOTE — Telephone Encounter (Signed)
Chart supports rx. Last OV: 05/13/2022 Next OV: 08/25/2022

## 2022-08-08 ENCOUNTER — Other Ambulatory Visit: Payer: Self-pay | Admitting: Family Medicine

## 2022-08-08 DIAGNOSIS — I10 Essential (primary) hypertension: Secondary | ICD-10-CM

## 2022-08-08 NOTE — Telephone Encounter (Signed)
Chart supports rx. Last OV: 05/13/2022

## 2022-08-10 DIAGNOSIS — K580 Irritable bowel syndrome with diarrhea: Principal | ICD-10-CM

## 2022-08-11 MED ORDER — XIFAXAN 550 MG TABLET
ORAL_TABLET | 0 refills | 0 days | Status: CP
Start: 2022-08-11 — End: ?

## 2022-08-12 ENCOUNTER — Emergency Department
Admit: 2022-08-12 | Discharge: 2022-08-14 | Disposition: A | Discharge status: Home / Self Care | Payer: PRIVATE HEALTH INSURANCE | Attending: Emergency Medicine

## 2022-08-12 ENCOUNTER — Ambulatory Visit
Admit: 2022-08-12 | Discharge: 2022-08-14 | Disposition: A | Discharge status: Home / Self Care | Payer: PRIVATE HEALTH INSURANCE | Attending: Emergency Medicine

## 2022-08-13 MED ORDER — CIPROFLOXACIN 500 MG TABLET
ORAL_TABLET | Freq: Two times a day (BID) | ORAL | 0 refills | 14 days | Status: CP
Start: 2022-08-13 — End: 2022-08-27

## 2022-08-13 MED ORDER — METRONIDAZOLE 500 MG TABLET
ORAL_TABLET | Freq: Three times a day (TID) | ORAL | 0 refills | 14.00000 days | Status: CP
Start: 2022-08-13 — End: 2022-08-27

## 2022-08-19 ENCOUNTER — Ambulatory Visit
Admit: 2022-08-19 | Discharge: 2022-08-20 | Payer: PRIVATE HEALTH INSURANCE | Attending: Gastroenterology | Primary: Gastroenterology

## 2022-08-19 DIAGNOSIS — Z23 Encounter for immunization: Principal | ICD-10-CM

## 2022-08-19 DIAGNOSIS — K50014 Crohn's disease of small intestine with abscess: Principal | ICD-10-CM

## 2022-08-19 DIAGNOSIS — K603 Anal fistula: Principal | ICD-10-CM

## 2022-08-19 DIAGNOSIS — L732 Hidradenitis suppurativa: Principal | ICD-10-CM

## 2022-08-19 DIAGNOSIS — K91850 Pouchitis: Principal | ICD-10-CM

## 2022-08-19 DIAGNOSIS — Z79899 Other long term (current) drug therapy: Principal | ICD-10-CM

## 2022-08-21 ENCOUNTER — Other Ambulatory Visit: Payer: Self-pay | Admitting: Family Medicine

## 2022-08-21 DIAGNOSIS — E782 Mixed hyperlipidemia: Secondary | ICD-10-CM

## 2022-08-21 NOTE — Telephone Encounter (Signed)
Chart supports rx. Last OV: [id number] Next OV: [id number]

## 2022-08-25 ENCOUNTER — Ambulatory Visit: Payer: BLUE CROSS/BLUE SHIELD | Admitting: Family Medicine

## 2022-09-03 DIAGNOSIS — K603 Anal fistula: Principal | ICD-10-CM

## 2022-09-03 DIAGNOSIS — K610 Anal abscess: Principal | ICD-10-CM

## 2022-09-03 MED ORDER — CIPROFLOXACIN 500 MG TABLET
ORAL_TABLET | Freq: Two times a day (BID) | ORAL | 1 refills | 30 days | Status: CP
Start: 2022-09-03 — End: ?

## 2022-09-07 ENCOUNTER — Other Ambulatory Visit: Payer: Self-pay | Admitting: Physical Medicine & Rehabilitation

## 2022-09-18 DIAGNOSIS — K50018 Crohn's disease of small intestine with other complication: Principal | ICD-10-CM

## 2022-09-29 ENCOUNTER — Telehealth: Payer: Self-pay

## 2022-09-29 NOTE — Transitions of Care (Post Inpatient/ED Visit) (Signed)
   09/29/2022  Name: Matthew Francis MRN: [id number] DOB: 08-24-1960  Today's TOC FU Call Status: Today's TOC FU Call Status:: Successful TOC FU Call Competed TOC FU Call Complete Date: 09/29/22  Transition Care Management Follow-up Telephone Call Date of Discharge: 09/25/22 Discharge Facility: Other ([facility]) Name of Other ([facility]) [facility]charge Facility: [facility] Type of Discharge: Inpatient Admission Primary Inpatient Discharge Diagnosis:: Gross hematuria How have you been since you were released from the hospital?: Better Any questions or concerns?: No  Items Reviewed: Did you receive and understand the discharge instructions provided?: Yes Medications obtained and verified?: Yes (Medications Reviewed) Any new allergies since your discharge?: No Dietary orders reviewed?: NA Do you have support at home?: Yes  Home Care and Equipment/Supplies: Were Home Health Services Ordered?: No Any new equipment or medical supplies ordered?: NA  Functional Questionnaire: Do you need assistance with bathing/showering or dressing?: No Do you need assistance with meal preparation?: No Do you need assistance with eating?: No Do you have difficulty maintaining continence: No Do you need assistance with getting out of bed/getting out of a chair/moving?: No Do you have difficulty managing or taking your medications?: No  Follow up appointments reviewed: PCP Follow-up appointment confirmed?: NA (pt is having insurance issues, will schedule a followup with Dr. Janee Morn once he gets issues with issue worked out) [facility] Follow-up appointment confirmed?: Yes Date of Specialist follow-up appointment?: 09/28/22 Follow-Up Specialty Provider:: [facility] HOSPITALIST AT HOME Do you need transportation to your follow-up appointment?: No Do you understand care options if your condition(s) worsen?: Yes-patient verbalized understanding    Agnes Lawrence, CMA (AAMA)  [facility] [phone]

## 2022-10-04 ENCOUNTER — Other Ambulatory Visit: Payer: Self-pay | Admitting: Family Medicine

## 2022-10-04 DIAGNOSIS — K219 Gastro-esophageal reflux disease without esophagitis: Secondary | ICD-10-CM

## 2022-10-19 DIAGNOSIS — K50018 Crohn's disease of small intestine with other complication: Principal | ICD-10-CM

## 2022-10-29 ENCOUNTER — Other Ambulatory Visit: Payer: Self-pay | Admitting: Family Medicine

## 2022-10-29 DIAGNOSIS — G479 Sleep disorder, unspecified: Secondary | ICD-10-CM

## 2022-11-02 ENCOUNTER — Other Ambulatory Visit: Payer: Self-pay | Admitting: Family Medicine

## 2022-11-02 DIAGNOSIS — E782 Mixed hyperlipidemia: Secondary | ICD-10-CM

## 2022-11-02 DIAGNOSIS — I10 Essential (primary) hypertension: Secondary | ICD-10-CM

## 2022-11-14 MED ORDER — SKYRIZI 60 MG/ML INTRAVENOUS SOLUTION
INTRAVENOUS | 2 refills | 28 days | Status: CP
Start: 2022-11-14 — End: 2022-11-14

## 2022-11-16 DIAGNOSIS — K50018 Crohn's disease of small intestine with other complication: Principal | ICD-10-CM

## 2022-11-28 ENCOUNTER — Ambulatory Visit
Admit: 2022-11-28 | Discharge: 2022-11-29 | Payer: PRIVATE HEALTH INSURANCE | Attending: Gastroenterology | Primary: Gastroenterology

## 2022-12-16 DIAGNOSIS — K50018 Crohn's disease of small intestine with other complication: Principal | ICD-10-CM

## 2022-12-16 MED ORDER — SKYRIZI 360 MG/2.4 ML (150 MG/ML) SUBCUTANEOUS WEARABLE INJECTOR
2 refills | 0 days | Status: CP
Start: 2022-12-16 — End: ?
  Filled 2023-02-04: qty 2.4, 56d supply, fill #0

## 2022-12-17 DIAGNOSIS — K50018 Crohn's disease of small intestine with other complication: Principal | ICD-10-CM

## 2022-12-22 NOTE — Unmapped (Signed)
[facility] Specialty Medication Onboarding    Specialty Medication: SKYRIZI 360 mg/2.4 mL (150 mg/mL) Injt (risankizumab-rzaa)  Prior Authorization: Approved   Financial Assistance: No - copay  <$25  Final Copay/Day Supply: $0 / 56 days    Insurance Restrictions: None     Notes to Pharmacist:   Credit Card on File: not applicable    The triage team has completed the benefits investigation and has determined that the patient is able to fill this medication at [facility]. Please contact the patient to complete the onboarding or follow up with the prescribing physician as needed.

## 2022-12-22 NOTE — Unmapped (Addendum)
Infusions scheduled:    Tony Andrade Q8W approved for $0 copay.   Skyrizi requires three IV inductions doses completed at weeks 0, 4, and 8 with the maintenance subcutaneous dose starting at week 12.   Tony Andrade completed 1st IV induction dose on 11/24/2022. The other induction IV doses are scheduled for 12/22/2022 and 01/19/2023.   Patient outreach is scheduled for 2 weeks before first subcutaneous dose 02/16/2023.    Teofilo Pod, PharmD   [facility]  [phone] (opt 4, then opt 2)

## 2022-12-23 DIAGNOSIS — K610 Anal abscess: Principal | ICD-10-CM

## 2022-12-23 DIAGNOSIS — K603 Anal fistula: Principal | ICD-10-CM

## 2022-12-23 MED ORDER — CIPROFLOXACIN 500 MG TABLET
ORAL_TABLET | Freq: Two times a day (BID) | ORAL | 2 refills | 30 days | Status: CP
Start: 2022-12-23 — End: ?

## 2022-12-23 NOTE — Unmapped (Signed)
Received message from pt that when he dropped down to Cipro 500mg  daily from BID he started seeing symptom recurrence of painful bowel movements, asking to go back up to BID dosing. Discussed with Dr. Gwenith Spitz and BID dosing script sent to the pharmacy.    Pt also reported he received his second Skyrizi infusion today at [facility] with no adverse reactions.

## 2023-01-01 ENCOUNTER — Other Ambulatory Visit: Payer: Self-pay | Admitting: Family Medicine

## 2023-01-01 DIAGNOSIS — K219 Gastro-esophageal reflux disease without esophagitis: Secondary | ICD-10-CM

## 2023-01-22 ENCOUNTER — Telehealth: Admit: 2023-01-22 | Discharge: 2023-01-23 | Payer: PRIVATE HEALTH INSURANCE

## 2023-01-22 DIAGNOSIS — K50018 Crohn's disease of small intestine with other complication: Principal | ICD-10-CM

## 2023-01-22 NOTE — Unmapped (Signed)
I discussed the findings, assessment and plan with the pharmacist and agree with the findings and plan as documented in the note.      Chelci Wintermute H Parrie Rasco, MD, PhD  Professor of Medicine

## 2023-01-22 NOTE — Unmapped (Signed)
[facility]  CLINICAL PHARMACIST PRACTITIONER VISIT    01/22/2023    HPI:     I saw Tony Andrade today for follow up after initiation of Risankizumab for management of his Crohn's like disease of the pouch at the request of Dr. Britt Bolognese.    Interval History:  Tony Andrade reports doing well since starting Skyrizi therapy. He reports having no problems with infusions, although does state he has a bruise at the site of IV from this past Monday's infusion. He reports his symptoms started to calm down after the first infusion and since then, he states he has 6 bowel movements per day that are now formed and no more urgency or blood in the stool. He also reports no nocturnal bowel movements anymore and no gas/rumbling. He still takes loperamide 4 mg twice daily, ciprofloxacin 500 mg twice daily and dicyclomine 10 mg four times a day.       Evaluation of IBD Biologic Therapy: risankizumab  Time on Current Therapy: Day 60 (started 6/10)     - Last Dose: 8/5 (3rd induction dose)     - Adherence: no missed infusions     - Symptoms:  # of BM/day: 6   Stool Consistency: Formed   Stool Urgency: none   Nocturnal BM: none   Blood in Stool: none   Abdominal Pain: none   Nausea/Vomiting: none   Any Sickness: Respiratory viral illness last week   NSAID use: none   Antidiarrheal use: Loperamide 4 mg BID      - Adverse Effects: none reported    Other IBD Therapies:  []  5-ASA  []  Immunomodulators  []  Corticosteroids    Disease Severity Index:  Modified Clinical Pouch Disease Activity Index:  Bowel frequency /24 hours 4-8  Bowel frequency during night 0-1  Blood in bowel movements none  Abdominal pain  None = 0  Bloating none  Incontinence none  Bodyweight stable  Loperamide Yes  Lomotil No  Hyoscyamine:  No  Antibiotic Cipro       MEDICATIONS/ALLERGIES:     Current Outpatient Medications   Medication Instructions    acetaminophen (TYLENOL) 500 mg, Oral, Every 6 hours PRN    activated charcoal, bulk, Powd Miscellaneous, Daily PRN    ascorbic acid, vitamin C, (VITAMIN C) 500 MG tablet 1 tablet, Oral, 2 times a day (standard)    cetirizine (ZYRTEC) 10 mg, Oral, Daily (standard)    cholecalciferol, vitamin D3-1,250 mcg, 50,000 unit,, 1,250 mcg (50,000 unit) capsule Weekly    ciprofloxacin HCl (CIPRO) 500 mg, Oral, 2 times a day (standard)    cyanocobalamin 100 MCG tablet 1 tablet, Oral, Daily (standard)    dicyclomine (BENTYL) 10 mg capsule TAKE 1 CAPSULE(10 MG) BY MOUTH FOUR TIMES DAILY BEFORE MEALS AND AT NIGHT    ezetimibe (ZETIA) 10 mg, Oral, Daily (standard)    famotidine (PEPCID) 40 mg, Oral, Daily PRN    glucosamine sulfate (GLUCOSAMINE ORAL) 1 tablet, Oral, daily    hydrALAZINE (APRESOLINE) 100 mg, Oral, 2 times a day    hydrocortisone (ANUSOL-HC) 2.5 % rectal cream Rectal, 2 times a day (standard)    ketoconazole (NIZORAL) 2 % cream 2 times a day (standard)    Lactobacillus acidophilus (PROBIOTIC ORAL) 1 capsule, Oral, daily    lisinopril (PRINIVIL,ZESTRIL) 5 mg, Oral, Daily (standard)    loperamide (IMODIUM) 2 mg capsule TAKE 2 CAPSULES BY MOUTH 4 TIMES DAILY    melatonin 10 mg, Oral, Nightly  methocarbamol (ROBAXIN) 500 mg, Oral, Every 8 hours PRN    multivitamin with minerals tablet 1 tablet, Oral, At bedtime    OMEGA-3/DHA/EPA/FISH OIL (FISH OIL-OMEGA-3 FATTY ACIDS) 300-1,000 mg capsule 2 g, Oral, 2 times a day    pantoprazole (PROTONIX) 40 mg, Oral, Daily (standard)    QUEtiapine (SEROQUEL) 25 mg, Oral, Nightly    risankizumab-rzaa (SKYRIZI) 360 mg/2.4 mL (150 mg/mL) Injt Inject the contents of 1 cartridge (360mg ) under the skin every 8 weeks    UNABLE TO FIND 1 packet, Oral, Daily (standard), Electrolyte Stamina  Power Pack        Allergies   Allergen Reactions    Mesalamine Hives and Swelling     Throat    Penicillins Anaphylaxis and Swelling     In throat    Sulfa (Sulfonamide Antibiotics) Swelling     throat    Venom-Honey Bee Hives and Swelling    Atorvastatin Other (See Comments)     Myalgia, muscle cramps    Iodinated Contrast Media      Pt reports allergy at [place]. Rapid heartbeat and dyspnea         IBD HISTORY:     Year of disease onset:  1995  Diagnosis:  Ulcerative Colitis  Age at onset:   9-40 yr old (A2)  Location:  Extensive (E3)  Behavior:  Colitis  Perianal Dz:  N/A    Brief IBD Disease Course:    Initially diagnosed with UC in 1995.    Between 1995 and 2002, he was treated with different 5-ASA compound, azathioprine, and systemic steroids.    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   07/2014 he underwent hernia repair and while hospitalized developed worsening diarrhea.  Treated with flagyl for 14 days along with his chronic Cipro therapy.  Responded initially but with recurrent symptoms as he completed the course.  Received a second round of flagyl (3/27-4/5), then went off both Cipro and flagyl (ran out the prescription for Cipro).  This resulted in worsening diarrhea.    10/04/2014 first presentation at the pouch clinic. He was started on Cipro and flagyl for 2 weeks, then alternate between the 2 for a week at a time.  Flagyl no effect later on 01/2015 start alternating doxy and Cipro, attempted initiation of Hyoscyamine   04/2015 Cipro mono due to no effectiveness of doxy, start of desipramine.   08/2014 his Desipramine was tapered to 10 mg po daily, which he has tolerated much better. He is having 4-6 BM per day.    05/2017 doing well on the combination of Cipro, hyoscyamine, desipramine low-dose 10 mg daily and Imodium   08/2018 severe diarrhea, switch to Entocort 9 mg and Cipro   10/2018 reoccurrence of diarrhea while on 6 mg of Entocort, increased bloating, Cipro stops working on steroid taper   01/2019 desipramine 10 mg to amitriptyline 25 mg, cefdinir 1 tablet twice daily as well as continue the budesonide 9 mg daily, later on switch back to Cipro twice daily   02/2019 steroid taper   03/2019 start of liraglutide with decrease of bowel frequency as well as Levaquin.    04/2019 switch back to Cipro, budesonide 9 mg as well as continuation of liraglutide.  01/2020 E. coli sepsis, seizure, new onset of diabetes.  07/2020: Stopped Entocort due to new onset IDDM. Also developed severe joint pains that improved significantly after cutting back on Cipro to 0.5 tab/day.  Later on trial of liraglutide which seems to work.    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) complicated by small R perinephric hematoma w/o mass effect, and hematuria   11/2022 start risankizumab for Crohn's like disease of the pouch    Endoscopy:      Pouchoscopy 08/13/22:  - Perianal fistula with active prurulent drainage  - Mild inflammation in the pouch body with several, shallow broad ulcers near the pouch inlet and at the pouch inlet    Imaging:    None recent    Extraintestinal Manifestations:   []  Joint Pains:  []  Ocular:  [x]  Skin: hidradenitis suppurativa   []  Oral ulcers:  []  Thrombosis:  []  PSC:  []  Other:    Prior IBD Therapy:  [x]  5-ASAs  [x]  Oral corticosteroids - prednisone, budeosonide  []  Intravenous corticosteroids  []  Antibiotics  [x]  Thiopurines - azathioprine  []  Methotrexate  []  Anti-TNF therapies  []  Anti-Integrin therapies  []  Anti-Interleukin therapies  []  Anti-JAK therapies  []  Cyclosporine  []  Clinical trial medication  []  Other (Please specify):    Antibiotic Use History:  - Ciprofloxacin: Responded well. But developed significant large joint pains and had to cut back on dose.  - Cefdinir: Responded well for a couple of weeks, then stopped working.  - Doxycycline: No response  - Flagyl: No response  - Bactrim, Augmentin: Not tried to due sulfa and penicillin allergies.  - Tinidazole: Prescribed but could not take due to cost issue.  - Vancomycin: Prescribed recently but could not take due to cost issue      IBD Health Maintenance    Vaccine Date   Influenza 07/25/2019   Pneumococcal 08/21/2022   COVID-19 04/16/2021   Zoster --   Hepatitis B --       []  Iron Deficiency  Lab Results   Component Value Date    FERRITIN 113.7 08/19/2022    LABIRON 32 08/19/2022    HGB 13.1 08/12/2022       []  Vitamin D Deficiency  Lab Results   Component Value Date    VITDTOTAL 51.0 08/19/2022          RELEVANT LABS, DATA, INDICES:     Lab Results   Component Value Date    HBSAG Nonreactive 08/19/2022    QFTTBGOLD Negative 08/19/2022       Lab Results   Component Value Date    WBC 7.6 08/12/2022    HGB 13.1 08/12/2022    HCT 39.1 08/12/2022    PLT 233 08/12/2022       Lab Results   Component Value Date    NA 141 08/19/2022    K 4.0 08/19/2022    CL 109 (H) 08/19/2022    CO2 23.2 08/19/2022    BUN 11 08/19/2022    CREATININE 1.14 08/19/2022    GLU 164 08/19/2022    CALCIUM 10.1 08/19/2022       Lab Results   Component Value Date    BILITOT 0.5 08/19/2022    PROT 7.6 08/19/2022    ALBUMIN 4.0 08/19/2022    ALT 21 08/19/2022  AST 33 08/19/2022    ALKPHOS 83 08/19/2022       Lab Results   Component Value Date    CRP <4.0 08/19/2022         ASSESSMENT & RECOMMENDATIONS:     Crohn's like disease of the pouch  Patient with positive clinical response to [facility] therapy with decrease bowel frequency, more formed stool, and no more stool urgency.   - Continue risankizumab therapy with first maintenance injection on 9/2  - Monitoring labs stable; will repeat in 6 months      The patient reports they are physically located in [place] and is currently: at home. I conducted a audio/video visit. I spent  16m 59s on the video call with the patient. I spent an additional 10 minutes on pre- and post-visit activities on the date of service .       --------------------------------------------    Edsel Petrin, PharmD, BCPS, CPP  Clinical Pharmacist Practitioner - GI/IBD  [facility]

## 2023-01-23 LAB — HEPATIC FUNCTION PANEL
ALKALINE PHOSPHATASE: 68 U/L
ALT (SGPT): 14 U/L
AST (SGOT): 21 U/L

## 2023-01-23 LAB — CBC
HEMATOCRIT: 36.6 % — ABNORMAL LOW
HEMOGLOBIN: 12.4 g/dL — ABNORMAL LOW
MEAN CORPUSCULAR VOLUME: 89.6 fL
PLATELET COUNT: 199 10*9/L
WBC ADJUSTED: 4.9 10*9/L

## 2023-01-23 LAB — C-REACTIVE PROTEIN: C-REACTIVE PROTEIN: 5 mg/L

## 2023-01-23 NOTE — Unmapped (Signed)
Labs DOS 01/19/2023 collected, full report available in media tab 01/21/2023.  Will enter to EPIC results.

## 2023-02-03 MED ORDER — EMPTY CONTAINER
3 refills | 0 days
Start: 2023-02-03 — End: ?

## 2023-02-03 NOTE — Unmapped (Signed)
[facility]   Patient Onboarding/Medication Counseling    Sent MyChart message with instructions and link to mfg video  Patient reports to have already noticed significant improvement since completing the 3 IV infusions. Prior to starting, patient reports to have 8-12 BM per day and now, on average, reports to have 1-3  BM per day and is able to eat spicy food with no ill-effects    Tony Andrade is a 62 y.o. male with Crohn's who I am counseling today on initiation of therapy.  I am speaking to the patient.    Was a Nurse, learning disability used for this call? No    Verified patient's date of birth / HIPAA.    Specialty medication(s) to be sent: Inflammatory Disorders: Skyrizi      Non-specialty medications/supplies to be sent: sharps      Medications not needed at this time: n/a         Skyrizi (risankizumab)    Medication & Administration     Dosage: Crohn's disease: Inject 360mg  under the skin every 8 weeks starting 4 weeks after 3rd IV induction dose (Completed at weeks 0, 4, and 8)  IV infusions: 11/24/2022, 12/22/2022, 01/19/2023  1st subcutaneous dose due: 02/16/2023    Lab tests required prior to treatment initiation:  Tuberculosis: Tuberculosis screening resulted in a non-reactive Quantiferon TB Gold assay.(Completed: 08/19/2022)    Administration:     Air traffic controller all supplies needed for injection on a clean, flat working surface: Plastic tray containing the On-body injector and prefilled cartridge, alcohol swab, sharps container, etc.  Remove unopened carton from the refrigerator and allow it to warm up to room temperature for at least 45 but no more than 90 minutes.  Look at the medication label - look for correct medication, correct dose, and check the expiration date  Remove the On-body injector and prefilled cartridge from plastic tray  Look at the On-body injector - check that it is intact and undamaged. Do NOT close the gray door before the prefilled cartridge is loaded  Look at the prefilled cartridge - the liquid should appear clear and colorless to slightly yellow, you may see tiny white or clear particles. Do NOT twist or remove cartridge top.  Use an alcohol swab to clean the smaller bottom tip of the prefilled cartridge and let it air dry. Do not touch the smaller bottom tip after cleaning.  Insert the smaller bottom tip into the On-Body injector first. Firmly push down until you hear a click. There may be a few drops of medicine on the back of the On-body Injector. That is normal. Close the gray door and squeeze firmly until it snaps closed.   You MUST start the injection within 5 minutes of loading the prefilled cartridge.   Select injection site - you can use the front of your thigh or your belly (but not the area 2 inches around your belly button)  Prepare injection site - wash your hands and clean the skin at the injection site with an alcohol swab and let it air dry, do not touch the injection site again before the injection  Peel the green tabs on the back of the On-body injector to expose the adhesive. This will activate the On-body injector and cause the status light to turn BLUE.   For the belly, move and hold the skin to create a firm, flat surface. You do not need to pull the skin if injecting on the front of the thighs.  When the blue light flashes, it is ready to start the injection.  Place the On-body injector onto the cleaned skin and then firmly press the gray button until you hear a click. This will start the injection and the light will continuously flash GREEN. This may take up to 5 minutes to complete the full dose.  The On-body Injector will automatically stop when the injection is finished. You will hear beeps and the status light will change to SOLID GREEN.   Remove the On-Body Injector by carefully peeling the adhesive from your skin. Avoid touching the needle or needle cover on the back of the On-Body Injector. You will hear several beeps and the status light will turn off.   Dispose of the used On-Body Injector immediately in [employer], water.      Adherence/Missed dose instructions:  If your injection is given more than 7 days after your scheduled injection date - consult your pharmacist for additional instructions on how to adjust your dosing schedule.    Goals of Therapy     Crohn's Disease  Achieve remission of symptoms  Maintain remission of symptoms  Minimize long-term systemic glucocorticoid use  Prevent need for surgical procedures  Maintenance of effective psychosocial functioning      Side Effects & Monitoring Parameters     Injection site reaction (redness, irritation, inflammation localized to the site of administration)  Signs of a common cold - minor sore throat, runny or stuffy nose, etc.  Felling tired/weak  Headache  Stomach, joint or back pain    The following side effects should be reported to the provider:  Signs of a hypersensitivity reaction - rash; hives; itching; red, swollen, blistered, or peeling skin; wheezing; tightness in the chest or throat; difficulty breathing, swallowing, or talking; swelling of the mouth, face, lips, tongue, or throat; etc.  Reduced immune function - report signs of infection such as fever; chills; body aches; very bad sore throat; ear or sinus pain; cough; more sputum or change in color of sputum; pain with passing urine; wound that will not heal, etc.  Also at a slightly higher risk of some malignancies (mainly skin and blood cancers) due to this reduced immune function.  In the case of signs of infection - the patient should hold the next dose of Skyrizi?? and call your primary care provider to ensure adequate medical care.  Treatment may be resumed when infection is treated and patient is asymptomatic.  Flu-like symptoms  Warm, red, or painful skin or sores on the body  Severe diarrhea or stomach pain      Contraindications, Warnings, & Precautions     Have your bloodwork checked as you have been told by your prescriber  Talk with your doctor if you are pregnant, planning to become pregnant, or breastfeeding  Discuss the possible need for holding your dose(s) of Skyrizi?? when a planned procedure is scheduled with the prescriber as it may delay healing/recovery timeline       Drug/Food Interactions     Medication list reviewed in Epic. The patient was instructed to inform the care team before taking any new medications or supplements. No drug interactions identified.   Talk with you prescriber or pharmacist before receiving any live vaccinations while taking this medication and after you stop taking it    Storage, Handling Precautions, & Disposal     Store this medication in the refrigerator.  Do not freeze   If needed, you may store at room temperature for  up to 24 hours  Store in [employer], protected from light  Do not shake  Dispose of used syringes/pens in a sharps disposal container          Current Medications (including OTC/herbals), Comorbidities and Allergies     Current Outpatient Medications   Medication Sig Dispense Refill    acetaminophen (TYLENOL) 500 MG tablet Take 1 tablet (500 mg total) by mouth every six (6) hours as needed for pain.      activated charcoal, bulk, Powd by Miscellaneous route daily as needed.      ascorbic acid, vitamin C, (VITAMIN C) 500 MG tablet Take 1 tablet (500 mg total) by mouth two (2) times a day.      cetirizine (ZYRTEC) 10 MG tablet Take 1 tablet (10 mg total) by mouth daily.      cholecalciferol, vitamin D3-1,250 mcg, 50,000 unit,, 1,250 mcg (50,000 unit) capsule once a week.      ciprofloxacin HCl (CIPRO) 500 MG tablet Take 1 tablet (500 mg total) by mouth two (2) times a day. 60 tablet 2    cyanocobalamin 100 MCG tablet Take 1 tablet (100 mcg total) by mouth daily.      dicyclomine (BENTYL) 10 mg capsule TAKE 1 CAPSULE(10 MG) BY MOUTH FOUR TIMES DAILY BEFORE MEALS AND AT NIGHT (Patient taking differently: Take 1 capsule (10 mg total) by mouth two (2) times a day.) 120 capsule 5    ezetimibe (ZETIA) 10 mg tablet Take 1 tablet (10 mg total) by mouth daily.      famotidine (PEPCID) 40 MG tablet Take 1 tablet (40 mg total) by mouth daily as needed for heartburn.      glucosamine sulfate (GLUCOSAMINE ORAL) Take 1 tablet by mouth daily at 0600.      hydrALAZINE (APRESOLINE) 50 MG tablet Take 2 tablets (100 mg total) by mouth two (2) times a day.      hydrocortisone (ANUSOL-HC) 2.5 % rectal cream Insert into the rectum Two (2) times a day. 30 g 0    ketoconazole (NIZORAL) 2 % cream Two (2) times a day.      Lactobacillus acidophilus (PROBIOTIC ORAL) Take 1 capsule by mouth daily at 0600.      lisinopril (PRINIVIL,ZESTRIL) 5 MG tablet Take 1 tablet (5 mg total) by mouth daily.      loperamide (IMODIUM) 2 mg capsule TAKE 2 CAPSULES BY MOUTH 4 TIMES DAILY 720 capsule 2    melatonin 5 mg cap Take 10 mg by mouth nightly.      methocarbamoL (ROBAXIN) 500 MG tablet Take 1 tablet (500 mg total) by mouth every eight (8) hours as needed.      multivitamin with minerals tablet Take 1 tablet by mouth at bedtime.      OMEGA-3/DHA/EPA/FISH OIL (FISH OIL-OMEGA-3 FATTY ACIDS) 300-1,000 mg capsule Take 2 capsules (2 g total) by mouth Two (2) times a day.      pantoprazole (PROTONIX) 40 MG tablet Take 1 tablet (40 mg total) by mouth daily.      QUEtiapine (SEROQUEL) 25 MG tablet Take 1 tablet (25 mg total) by mouth nightly.      risankizumab-rzaa (SKYRIZI) 360 mg/2.4 mL (150 mg/mL) Injt Inject the contents of 1 cartridge (360mg ) under the skin every 8 weeks 2.4 mL 2    UNABLE TO FIND Take 1 packet by mouth daily. Electrolyte Stamina  Power Pack       No current facility-administered medications for this visit.  Allergies   Allergen Reactions    Mesalamine Hives and Swelling     Throat    Penicillins Anaphylaxis and Swelling     In throat    Sulfa (Sulfonamide Antibiotics) Swelling     throat    Venom-Honey Bee Hives and Swelling    Atorvastatin Other (See Comments)     Myalgia, muscle cramps    Iodinated Contrast Media      Pt reports allergy at [employer]. Rapid heartbeat and dyspnea       Patient Active Problem List   Diagnosis    Essential hypertension    H/O total colectomy    Pouchitis (CMS-HCC)    Gastroesophageal reflux disease without esophagitis    S/P hernia repair    Foreign body (FB) in soft tissue    Crohn's disease of small intestine with other complication (CMS-HCC)    Renal cell carcinoma of right kidney (CMS-HCC)       Reviewed and up to date in Epic.    Appropriateness of Therapy     Acute infections noted within Epic:  Rule Out C. Diff  Patient reported infection: None    Is the medication and dose appropriate based on diagnosis, medication list, comorbidities, allergies, medical history, patient???s ability to self-administer the medication, and therapeutic goals? Yes    Prescription has been clinically reviewed: Yes      Baseline Quality of Life Assessment      How many days over the past month did your Crohn's  keep you from your normal activities? For example, brushing your teeth or getting up in the morning. 0    Financial Information     Medication Assistance provided: Prior Authorization    Anticipated copay of $0 reviewed with patient. Verified delivery address.    Delivery Information     Scheduled delivery date: 8/22    Expected start date: 9/2      Medication will be delivered via UPS to the prescription address in [facility].  This shipment will not require a signature.      Explained the services we provide at [facility] and that each month we would call to set up refills.  Stressed importance of returning phone calls so that we could ensure they receive their medications in time each month.  Informed patient that we should be setting up refills 7-10 days prior to when they will run out of medication.  A pharmacist will reach out to perform a clinical assessment periodically.  Informed patient that a welcome packet, containing information about our pharmacy and other support services, a Notice of Privacy Practices, and a drug information handout will be sent.      The patient or caregiver noted above participated in the development of this care plan and knows that they can request review of or adjustments to the care plan at any time.      Patient or caregiver verbalized understanding of the above information as well as how to contact the pharmacy at [phone] option 4 with any questions/concerns.  The pharmacy is open Monday through Friday 8:30am-4:30pm.  A pharmacist is available 24/7 via pager to answer any clinical questions they may have.    Patient Specific Needs     Does the patient have any physical, cognitive, or cultural barriers? No    Does the patient have adequate living arrangements? (i.e. the ability to store and take their medication appropriately) Yes    Did you identify any home  environmental safety or security hazards? No    Patient prefers to have medications discussed with  Patient     Is the patient or caregiver able to read and understand education materials at a high school level or above? Yes    Patient's primary language is  English     Is the patient high risk? No    SOCIAL DETERMINANTS OF HEALTH     At the [facility], we have learned that life circumstances - like trouble affording food, housing, utilities, or transportation can affect the health of many of our patients.   That is why we wanted to ask: are you currently experiencing any life circumstances that are negatively impacting your health and/or quality of life? No    Social Determinants of Health     Financial Resource Strain: Low Risk  (01/07/2023)    Received from [facility]    Overall Financial Resource Strain (CARDIA)     Difficulty of Paying Living Expenses: Not very hard   Internet Connectivity: Not on file   Food Insecurity: No Food Insecurity (01/07/2023)    Received from [facility]    Hunger Vital Sign     Worried About Running Out of Food in the Last Year: Never true     Ran Out of Food in the Last Year: Never true   Tobacco Use: Low Risk  (01/07/2023)    Received from [facility]    Patient History     Smoking Tobacco Use: Never     Smokeless Tobacco Use: Never     Passive Exposure: Never   Housing/Utilities: Not on file   Alcohol Use: Not on file   Transportation Needs: No Transportation Needs (01/07/2023)    Received from [facility] - Transportation     Lack of Transportation (Medical): No     Lack of Transportation (Non-Medical): No   Substance Use: Not on file   Health Literacy: Not on file   Physical Activity: Not on File (10/03/2022)    Received from [facility]    Physical Activity     Physical Activity: 0   Interpersonal Safety: Unknown (02/03/2023)    Interpersonal Safety     Unsafe Where You Currently Live: Not on file     Physically Hurt by Anyone: Not on file     Abused by Anyone: Not on file   Stress: Not on File (10/03/2022)    Received from [facility]    Stress     Stress: 0   Intimate Partner Violence: Not At Risk (11/28/2022)    Humiliation, Afraid, Rape, and Kick questionnaire     Fear of Current or Ex-Partner: No     Emotionally Abused: No     Physically Abused: No     Sexually Abused: No   Depression: Not at risk (12/22/2022)    Received from [facility]    PHQ-2     Patient Health Questionnaire-2 Score: 0   Recent Concern: Depression - At risk (11/28/2022)    PHQ-2     PHQ-2 Score: 4   Social Connections: Not on File (10/03/2022)    Received from [facility]    Social Connections     Social Connections and Isolation: 0       Would you be willing to receive help with any of the needs that you have identified today? Not applicable       Tony Andrade, PharmD  [facility] Specialty Pharmacist

## 2023-02-04 MED FILL — EMPTY CONTAINER: 120 days supply | Qty: 1 | Fill #0

## 2023-02-08 DIAGNOSIS — K50018 Crohn's disease of small intestine with other complication: Principal | ICD-10-CM

## 2023-02-12 MED ORDER — CHOLECALCIFEROL (VITAMIN D3) 1,250 MCG (50,000 UNIT) CAPSULE
ORAL_CAPSULE | ORAL | 1 refills | 84 days | Status: CP
Start: 2023-02-12 — End: ?

## 2023-02-12 NOTE — Unmapped (Signed)
Encounter for refill request:  Last clinic visit: 11/28/2022  Appointments which have been scheduled for you      Apr 30, 2023 11:00 AM  (Arrive by 10:45 AM)  RETURN IBD with Modena Nunnery, MD  [facility] [place] [facility]) [address]  [facility] [address]  [place] [address]  [phone]             Lab Results   Component Value Date    WBC 4.9 01/19/2023    RBC 4.34 08/12/2022    HGB 12.4 (L) 01/19/2023    HCT 36.6 (L) 01/19/2023    PLT 199 01/19/2023    ALT 14 01/19/2023    AST 21 01/19/2023    ALKPHOS 68 01/19/2023    CRP 5.0 01/19/2023       Vitamin D refills authorized x 6 months per Dr. Gwenith Spitz.

## 2023-02-24 ENCOUNTER — Other Ambulatory Visit: Payer: Self-pay | Admitting: Family Medicine

## 2023-02-24 DIAGNOSIS — E118 Type 2 diabetes mellitus with unspecified complications: Secondary | ICD-10-CM

## 2023-03-04 NOTE — Unmapped (Signed)
Patient was able to dose OBI at home around 8/29 with no questions or concerns, aided by Denyse Amass RN.  He will update prn.

## 2023-03-08 DIAGNOSIS — K50018 Crohn's disease of small intestine with other complication: Principal | ICD-10-CM

## 2023-03-12 ENCOUNTER — Encounter (INDEPENDENT_AMBULATORY_CARE_PROVIDER_SITE_OTHER): Payer: Commercial Managed Care - HMO | Admitting: Ophthalmology

## 2023-03-24 NOTE — Unmapped (Signed)
[facility] Clinical Assessment & Refill Coordination Note    Patient stated first dose went will and reports that headache pt was experiencing following infusions did not occur with OBI  First dose given on 8/28; next dose due 10/23    Tony Andrade, DOB: 10/11/1960  Phone: [phone] (home)     All above HIPAA information was verified with patient.     Was a Nurse, learning disability used for this call? No    Specialty Medication(s):   Inflammatory Disorders: Skyrizi     Current Outpatient Medications   Medication Sig Dispense Refill    acetaminophen (TYLENOL) 500 MG tablet Take 1 tablet (500 mg total) by mouth every six (6) hours as needed for pain.      activated charcoal, bulk, Powd by Miscellaneous route daily as needed.      ascorbic acid, vitamin C, (VITAMIN C) 500 MG tablet Take 1 tablet (500 mg total) by mouth two (2) times a day.      cetirizine (ZYRTEC) 10 MG tablet Take 1 tablet (10 mg total) by mouth daily.      cholecalciferol, vitamin D3-1,250 mcg, 50,000 unit,, 1,250 mcg (50,000 unit) capsule Take 1 capsule (1,250 mcg total) by mouth once a week. 12 capsule 1    ciprofloxacin HCl (CIPRO) 500 MG tablet Take 1 tablet (500 mg total) by mouth two (2) times a day. 60 tablet 2    cyanocobalamin 100 MCG tablet Take 1 tablet (100 mcg total) by mouth daily.      dicyclomine (BENTYL) 10 mg capsule TAKE 1 CAPSULE(10 MG) BY MOUTH FOUR TIMES DAILY BEFORE MEALS AND AT NIGHT (Patient taking differently: Take 1 capsule (10 mg total) by mouth two (2) times a day.) 120 capsule 5    empty container Misc Use as directed 1 each 3    ezetimibe (ZETIA) 10 mg tablet Take 1 tablet (10 mg total) by mouth daily.      famotidine (PEPCID) 40 MG tablet Take 1 tablet (40 mg total) by mouth daily as needed for heartburn.      glucosamine sulfate (GLUCOSAMINE ORAL) Take 1 tablet by mouth daily at 0600.      hydrALAZINE (APRESOLINE) 50 MG tablet Take 2 tablets (100 mg total) by mouth two (2) times a day. hydrocortisone (ANUSOL-HC) 2.5 % rectal cream Insert into the rectum Two (2) times a day. 30 g 0    ketoconazole (NIZORAL) 2 % cream Two (2) times a day.      Lactobacillus acidophilus (PROBIOTIC ORAL) Take 1 capsule by mouth daily at 0600.      lisinopril (PRINIVIL,ZESTRIL) 5 MG tablet Take 1 tablet (5 mg total) by mouth daily.      loperamide (IMODIUM) 2 mg capsule TAKE 2 CAPSULES BY MOUTH 4 TIMES DAILY 720 capsule 2    melatonin 5 mg cap Take 10 mg by mouth nightly.      methocarbamoL (ROBAXIN) 500 MG tablet Take 1 tablet (500 mg total) by mouth every eight (8) hours as needed.      multivitamin with minerals tablet Take 1 tablet by mouth at bedtime.      OMEGA-3/DHA/EPA/FISH OIL (FISH OIL-OMEGA-3 FATTY ACIDS) 300-1,000 mg capsule Take 2 capsules (2 g total) by mouth Two (2) times a day.      pantoprazole (PROTONIX) 40 MG tablet Take 1 tablet (40 mg total) by mouth daily.      QUEtiapine (SEROQUEL) 25 MG tablet Take 1 tablet (25 mg total) by mouth  nightly.      risankizumab-rzaa (SKYRIZI) 360 mg/2.4 mL (150 mg/mL) Injt Inject the contents of 1 cartridge (360mg ) under the skin every 8 weeks 2.4 mL 2    UNABLE TO FIND Take 1 packet by mouth daily. Electrolyte Stamina  Power Pack       No current facility-administered medications for this visit.        Changes to medications: Tony Andrade reports no changes at this time.    Allergies   Allergen Reactions    Mesalamine Hives and Swelling     Throat    Penicillins Anaphylaxis and Swelling     In throat    Sulfa (Sulfonamide Antibiotics) Swelling     throat    Venom-Honey Bee Hives and Swelling    Atorvastatin Other (See Comments)     Myalgia, muscle cramps    Iodinated Contrast Media      Pt reports allergy at . Rapid heartbeat and dyspnea       Changes to allergies: No    SPECIALTY MEDICATION ADHERENCE     Skyrizi 360  mg/2.20mL : 0 doses of medicine on hand   Medication Adherence    Patient reported X missed doses in the last month: 0  Specialty Medication: Skyrizi OBI 360mg /2.69mL -1q8w  Patient is on additional specialty medications: No  Patient is on more than two specialty medications: No  Informant: patient          Specialty medication(s) dose(s) confirmed: Regimen is correct and unchanged.     Are there any concerns with adherence? No    Adherence counseling provided? Not needed    CLINICAL MANAGEMENT AND INTERVENTION      Clinical Benefit Assessment:    Do you feel the medicine is effective or helping your condition? Yes    Clinical Benefit counseling provided? Not needed    Adverse Effects Assessment:    Are you experiencing any side effects? No    Are you experiencing difficulty administering your medicine? No    Quality of Life Assessment:    Quality of Life    Rheumatology  Oncology  Dermatology  Cystic Fibrosis          How many days over the past month did your CD  keep you from your normal activities? For example, brushing your teeth or getting up in the morning. 0    Have you discussed this with your provider? Not needed    Acute Infection Status:    Acute infections noted within Epic:  Rule Out C. Diff  Patient reported infection: None    Therapy Appropriateness:    Is therapy appropriate based on current medication list, adverse reactions, adherence, clinical benefit and progress toward achieving therapeutic goals? Yes, therapy is appropriate and should be continued     DISEASE/MEDICATION-SPECIFIC INFORMATION      For patients on injectable medications: Patient currently has 0 doses left.  Next injection is scheduled for 10/23.    Chronic Inflammatory Diseases: Have you experienced any flares in the last month? No  Has this been reported to your provider? Not applicable    PATIENT SPECIFIC NEEDS     Does the patient have any physical, cognitive, or cultural barriers? No    Is the patient high risk? No    Did the patient require a clinical intervention? No    Does the patient require physician intervention or other additional services (i.e., nutrition, smoking cessation, social work)? No    SOCIAL DETERMINANTS OF HEALTH  At the [facility], we have learned that life circumstances - like trouble affording food, housing, utilities, or transportation can affect the health of many of our patients.   That is why we wanted to ask: are you currently experiencing any life circumstances that are negatively impacting your health and/or quality of life? No    Social Determinants of Health     Food Insecurity: No Food Insecurity (01/07/2023)    Received from [facility]    Hunger Vital Sign     Worried About Running Out of Food in the Last Year: Never true     Ran Out of Food in the Last Year: Never true   Internet Connectivity: Not on file   Housing/Utilities: Not on file   Tobacco Use: Low Risk  (01/07/2023)    Received from [facility]    Patient History     Smoking Tobacco Use: Never     Smokeless Tobacco Use: Never     Passive Exposure: Never   Transportation Needs: No Transportation Needs (01/07/2023)    Received from [facility] - Transportation     Lack of Transportation (Medical): No     Lack of Transportation (Non-Medical): No   Alcohol Use: Not on file   Interpersonal Safety: Unknown (03/24/2023)    Interpersonal Safety     Unsafe Where You Currently Live: Not on file     Physically Hurt by Anyone: Not on file     Abused by Anyone: Not on file   Physical Activity: Not on File (10/03/2022)    Received from [facility]    Physical Activity     Physical Activity: 0   Intimate Partner Violence: Not At Risk (11/28/2022)    Humiliation, Afraid, Rape, and Kick questionnaire     Fear of Current or Ex-Partner: No     Emotionally Abused: No     Physically Abused: No     Sexually Abused: No   Stress: Not on File (10/03/2022)    Received from [facility]    Stress     Stress: 0   Substance Use: Not on file   Social Connections: Not on File (10/03/2022)    Received from [facility]    Social Connections     Social Connections and Isolation: 0   Financial Resource Strain: Low Risk  (01/07/2023)    Received from [facility]    Overall Financial Resource Strain (CARDIA)     Difficulty of Paying Living Expenses: Not very hard   Depression: Not at risk (12/22/2022)    Received from [facility]    PHQ-2     Patient Health Questionnaire-2 Score: 0   Recent Concern: Depression - At risk (11/28/2022)    PHQ-2     PHQ-2 Score: 4   Health Literacy: Not on file       Would you be willing to receive help with any of the needs that you have identified today? Not applicable       SHIPPING     Specialty Medication(s) to be Shipped:   Inflammatory Disorders: Skyrizi    Other medication(s) to be shipped: No additional medications requested for fill at this time     Changes to insurance: No    Delivery Scheduled: Yes, Expected medication delivery date: 10/16.     Medication will be delivered via UPS to the confirmed prescription address in [facility].    The patient will receive a drug information handout for each medication shipped  and additional FDA Medication Guides as required.  Verified that patient has previously received a Conservation officer, historic buildings and a Surveyor, mining.    The patient or caregiver noted above participated in the development of this care plan and knows that they can request review of or adjustments to the care plan at any time.      All of the patient's questions and concerns have been addressed.    Teofilo Pod, PharmD   [facility] Specialty Pharmacist

## 2023-03-31 MED FILL — SKYRIZI 360 MG/2.4 ML (150 MG/ML) SUBCUTANEOUS WEARABLE INJECTOR: 56 days supply | Qty: 2.4 | Fill #1

## 2023-04-01 MED ORDER — DICYCLOMINE 10 MG CAPSULE
ORAL_CAPSULE | Freq: Four times a day (QID) | ORAL | 2 refills | 30 days | Status: CP | PRN
Start: 2023-04-01 — End: ?

## 2023-04-01 MED ORDER — LOPERAMIDE 2 MG CAPSULE
ORAL_CAPSULE | Freq: Four times a day (QID) | ORAL | 2 refills | 90 days | Status: CP | PRN
Start: 2023-04-01 — End: ?

## 2023-04-01 MED ORDER — HYDROCORTISONE 2.5 % TOPICAL CREAM WITH PERINEAL APPLICATOR
Freq: Two times a day (BID) | RECTAL | 0 refills | 0 days | Status: CP
Start: 2023-04-01 — End: ?

## 2023-04-01 NOTE — Unmapped (Signed)
Encounter for refill request:  Last clinic visit: 11/28/2022    Appointments which have been scheduled for you      Apr 30, 2023 11:00 AM  (Arrive by 10:45 AM)  RETURN IBD with Modena Nunnery, MD  [facility] [place] [facility]) [address]  [facility] [address]  [place] [address]  [phone]             Lab Results   Component Value Date    WBC 4.9 01/19/2023    RBC 4.34 08/12/2022    HGB 12.4 (L) 01/19/2023    HCT 36.6 (L) 01/19/2023    PLT 199 01/19/2023    ALT 14 01/19/2023    AST 21 01/19/2023    ALKPHOS 68 01/19/2023    CRP 5.0 01/19/2023    CREATININE 1.14 08/19/2022       Loperamide, bentyl, hydrocortisone cream refills authorized x  

## 2023-04-05 DIAGNOSIS — K50018 Crohn's disease of small intestine with other complication: Principal | ICD-10-CM

## 2023-04-10 DIAGNOSIS — K603 Anal fistula: Principal | ICD-10-CM

## 2023-04-10 MED ORDER — DIPHENOXYLATE-ATROPINE 2.5 MG-0.025 MG TABLET
ORAL_TABLET | Freq: Four times a day (QID) | ORAL | 2 refills | 60 days | PRN
Start: 2023-04-10 — End: 2023-10-07

## 2023-04-10 MED ORDER — METRONIDAZOLE 500 MG TABLET
ORAL_TABLET | Freq: Two times a day (BID) | ORAL | 0 refills | 10 days | Status: CP
Start: 2023-04-10 — End: ?

## 2023-04-10 NOTE — Unmapped (Signed)
Discussed with Dr. Gwenith Spitz. Will add flagyl 500mg  BID X10days now, continue cipro, sitz baths. If pain worsens, fevers, ER.  Add lomotil for alternative to imodium which is not working well for patient at the moment.

## 2023-04-10 NOTE — Unmapped (Signed)
Patient called with worsening rectal pain, similar to previous rectal abscess that occurred in Feb 2024 but slightly higher location.   Increased pain with with wiping/BMs, some mild discomfort the rest of the day.   No fevers. No drainage from this spot.  Having some straining with BMs and some increased frequency, more liquid stools leading up to to this rectal pain.     Skyrizi was given 04/08/23. About a week prior to dose starting having more liquid stools, increased frequency and needing to add back imodium.  For the last 6+ months while on the skyrizi stools have been formed and he has not needed imodium.  Post skyrizi administration, stools are now becoming more solid again.      He is currently on cipro 500mg  BID.

## 2023-04-11 MED ORDER — DIPHENOXYLATE-ATROPINE 2.5 MG-0.025 MG TABLET
ORAL_TABLET | Freq: Four times a day (QID) | ORAL | 2 refills | 60 days | Status: CP | PRN
Start: 2023-04-11 — End: 2023-10-08

## 2023-04-13 NOTE — Unmapped (Signed)
Patient reports improvement in diarrhea and discomfort at fistula spot as of Monday 10/28, he will continue to monitor, update prn.

## 2023-04-26 DIAGNOSIS — K603 Perianal fistula: Principal | ICD-10-CM

## 2023-04-26 DIAGNOSIS — K610 Anal abscess: Principal | ICD-10-CM

## 2023-04-26 MED ORDER — CIPROFLOXACIN 500 MG TABLET
ORAL_TABLET | Freq: Two times a day (BID) | ORAL | 2 refills | 30 days
Start: 2023-04-26 — End: ?

## 2023-04-27 MED ORDER — CIPROFLOXACIN 500 MG TABLET
ORAL_TABLET | Freq: Two times a day (BID) | ORAL | 2 refills | 30 days | Status: CP
Start: 2023-04-27 — End: ?

## 2023-04-28 NOTE — Unmapped (Signed)
 It was medically necessary for me to see the patient in addition to the APP because of the complexity of Mr Tony Andrade's  case and lack of response to previous therapy.  My visit included a discussion of the clinical  symptoms, a consideration of further testing, and evaluation of treatment options.     Britt Bolognese, MD, PhD     [facility] GASTROENTEROLOGY CONSULTATION VISIT      REFERRING PROVIDER:  Woodfin Ganja, MD  [address]  [place],  [place] [address]    PRIMARY CARE PROVIDER:  Woodfin Ganja, MD      PATIENT PROFILE:        Tony Andrade is a 62 y.o. male (DOB: [birth year]) who is seen in follow up for evaluation of pouchitis.     CHIEF COMPLAINT: follow-up    HISTORY OF PRESENT ILLNESS: Tony Andrade is a 62 y.o. 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      Patient reports [name] working wonderfully for him, him having 4 formed bowel movements without blood or urgency. Previously before 10-12. On Cipro bid and lomotil 2 tablets daily. He did have a new central abdominal pain that occurred last evening, occurring prior to eating dinner, that he had never experienced before.    He did have a recurrence of perirectal pain, similar to the abscess/fistula he had in Feb 2024, but higher up on the left buttock. He was prescribed Flagyl twice daily, which he completed yesterday, that has resolved this. He denied any drainage. No HS flares.    He does have URI symptoms today of runny nose and sneezing. He did have a fever that broke a few days ago and now just has these residuals. He also endorses allergies and is having a hard time finding Zyrtec.    Bowel frequency /24 hours  4  Bowel frequency during night 0-1  Blood in bowel movements none  Abdominal pain   new and moderate  Bloating none  Incontinence none  Bodyweight stable  Loperamide Yes 2 tablets  Lomotil Yes 2 tablets  Hyoscyamine:  No  Antibiotic Other  Cipro      ROS: All review of systems negative except as above.    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     PAST MEDICAL HISTORY:    Past Medical History:   Diagnosis Date    GERD (gastroesophageal reflux disease)     H/O colectomy     Hypertension     Pouchitis (CMS-HCC)     Stroke (CMS-HCC)     Ulcerative colitis (CMS-HCC)        MEDICATIONS:      Current Outpatient Medications:     acetaminophen (TYLENOL) 500 MG tablet, Take 1 tablet (500 mg total) by mouth every six (6) hours as needed for pain., Disp: , Rfl:  activated charcoal, bulk, Powd, by Miscellaneous route daily as needed., Disp: , Rfl:     ascorbic acid, vitamin C, (VITAMIN C) 500 MG tablet, Take 1 tablet (500 mg total) by mouth two (2) times a day., Disp: , Rfl:     cetirizine (ZYRTEC) 10 MG tablet, Take 1 tablet (10 mg total) by mouth daily., Disp: , Rfl:     cholecalciferol, vitamin D3-1,250 mcg, 50,000 unit,, 1,250 mcg (50,000 unit) capsule, Take 1 capsule (1,250 mcg total) by mouth once a week., Disp: 12 capsule, Rfl: 1    ciprofloxacin HCl (CIPRO) 500 MG tablet, Take 1 tablet (500 mg total) by mouth two (2) times a day., Disp: 60 tablet, Rfl: 2    cyanocobalamin 100 MCG tablet, Take 1 tablet (100 mcg total) by mouth daily., Disp: , Rfl:     dicyclomine (BENTYL) 10 mg capsule, Take 1 capsule (10 mg total) by mouth four (4) times a day as needed., Disp: 120 capsule, Rfl: 2    diphenoxylate-atropine (LOMOTIL) 2.5-0.025 mg per tablet, Take 1 tablet by mouth four (4) times a day as needed for diarrhea., Disp: 240 tablet, Rfl: 2    empty container Misc, Use as directed, Disp: 1 each, Rfl: 3    ezetimibe (ZETIA) 10 mg tablet, Take 1 tablet (10 mg total) by mouth daily., Disp: , Rfl:     famotidine (PEPCID) 40 MG tablet, Take 1 tablet (40 mg total) by mouth daily as needed for heartburn., Disp: , Rfl:     glucosamine sulfate (GLUCOSAMINE ORAL), Take 1 tablet by mouth daily at 0600., Disp: , Rfl:     hydrALAZINE (APRESOLINE) 50 MG tablet, Take 2 tablets (100 mg total) by mouth two (2) times a day., Disp: , Rfl:     hydrocortisone (ANUSOL-HC) 2.5 % rectal cream, Insert into the rectum two (2) times a day., Disp: 30 g, Rfl: 0    ketoconazole (NIZORAL) 2 % cream, Two (2) times a day., Disp: , Rfl:     Lactobacillus acidophilus (PROBIOTIC ORAL), Take 1 capsule by mouth daily at 0600., Disp: , Rfl:     lisinopril (PRINIVIL,ZESTRIL) 5 MG tablet, Take 1 tablet (5 mg total) by mouth daily., Disp: , Rfl:     loperamide (IMODIUM) 2 mg capsule, Take 2 capsules (4 mg total) by mouth four (4) times a day as needed for diarrhea., Disp: 720 capsule, Rfl: 2    melatonin 5 mg cap, Take 10 mg by mouth nightly., Disp: , Rfl:     methocarbamoL (ROBAXIN) 500 MG tablet, Take 1 tablet (500 mg total) by mouth every eight (8) hours as needed., Disp: , Rfl:     metroNIDAZOLE (FLAGYL) 500 MG tablet, Take 1 tablet (500 mg total) by mouth two (2) times a day., Disp: 20 tablet, Rfl: 0    multivitamin with minerals tablet, Take 1 tablet by mouth at bedtime., Disp: , Rfl:     OMEGA-3/DHA/EPA/FISH OIL (FISH OIL-OMEGA-3 FATTY ACIDS) 300-1,000 mg capsule, Take 2 capsules (2 g total) by mouth Two (2) times a day., Disp: , Rfl:     pantoprazole (PROTONIX) 40 MG tablet, Take 1 tablet (40 mg total) by mouth daily., Disp: , Rfl:     QUEtiapine (SEROQUEL) 25 MG tablet, Take 1 tablet (25 mg total) by mouth nightly., Disp: , Rfl:     risankizumab-rzaa (SKYRIZI) 360 mg/2.4 mL (150 mg/mL) Injt, Inject the contents of 1 cartridge (360mg ) under the skin every 8 weeks, Disp: 2.4 mL, Rfl: 2  UNABLE TO FIND, Take 1 packet by mouth daily. Electrolyte Stamina  Power Pack, Disp: , Rfl:       ALLERGIES:    Mesalamine, Penicillins, Sulfa (sulfonamide antibiotics), Venom-honey bee, Atorvastatin, and Iodinated contrast media    SOCIAL HISTORY:    Social History     Socioeconomic History    Marital status: Single   Tobacco Use    Smoking status: Never    Smokeless tobacco: Never   Substance and Sexual Activity    Alcohol use: Yes    Drug use: No   Social History Narrative    Incarcerated in [employer] [facility].  No job currently.       Social Determinants of Health     Financial Resource Strain: Low Risk  (01/07/2023)    Received from [facility]    Overall Financial Resource Strain (CARDIA)     Difficulty of Paying Living Expenses: Not very hard   Food Insecurity: No Food Insecurity (01/07/2023)    Received from [facility]    Hunger Vital Sign     Worried About Running Out of Food in the Last Year: Never true     Ran Out of Food in the Last Year: Never true   Transportation Needs: No Transportation Needs (01/07/2023)    Received from [facility] - [facility]     Lack of Transportation (Medical): No     Lack of Transportation (Non-Medical): No   Physical Activity: Not on File (10/03/2022)    Received from [facility]    Physical Activity     Physical Activity: 0   Stress: Not on File (10/03/2022)    Received from [facility]    Stress     Stress: 0   Social Connections: Not on File (10/03/2022)    Received from [facility]    Social Connections     Social Connections and Isolation: 0       FAMILY HISTORY:    family history includes Other in his mother; Stroke in his father.     VITAL SIGNS:    BP 110/80  - Pulse 92  - Temp 36.9 ??C (98.5 ??F)  - Ht 180.3 cm (5' 11)  - Wt 95.6 kg (210 lb 12.8 oz)  - BMI 29.40 kg/m??      BP Readings from Last 3 Encounters:   04/30/23 110/80   11/28/22 121/81   08/19/22 126/72      Wt Readings from Last 3 Encounters:   04/30/23 95.6 kg (210 lb 12.8 oz)   11/28/22 95.7 kg (211 lb)   08/19/22 89.8 kg (198 lb)      BMI: Estimated body mass index is 29.43 kg/m?? as calculated from the following:    Height as of 11/28/22: 180.3 cm (5' 11).    Weight as of 11/28/22: 95.7 kg (211 lb).    BSA: Estimated body surface area is 2.19 meters squared as calculated from the following:    Height as of 11/28/22: 180.3 cm (5' 11).    Weight as of 11/28/22: 95.7 kg (211 lb).    PHYSICAL EXAM:    Constitutional:   Alert, oriented x 3, no acute distress, well nourished, and well hydrated.   Mental Status:   Thought organized, appropriate affect, pleasantly interactive, not anxious appearing.   HEENT:   PEERL, conjunctiva clear, anicteric, oropharynx clear, neck supple, no LAD.   Respiratory: Clear to auscultation, unlabored breathing.     Cardiac: Euvolemic, regular rate and rhythm, normal  S1 and S2, no murmur.     Abdomen: Soft, normal bowel sounds, non-distended, TTP RLQ, no organomegaly or masses.     Perianal/Rectal Exam No abscess, tenderness or induration      Extremities:   No edema, well perfused.   Musculoskeletal: No joint swelling or tenderness noted, no deformities.     Skin: No rashes, jaundice or skin lesions noted. Scar left armpit, history of boils.      Neuro: No focal deficits.            DIAGNOSTIC STUDIES:  I have reviewed all pertinent diagnostic studies, including:    Imaging:  MRI 07/2022:  Posterior perianal abscess in the intersphincteric space measuring up to 2.1 cm.      This is associated with a left posterolateral, intersphincteric perianal fistula which also extends inferiorly, terminating at the skin surface of the medial left gluteal fold where there is a superficial, sub centimeter fluid collection, likely the fistula drainage site and/or small subcutaneous abscess.     CT 07/2022  Inflammatory changes are seen adjacent to the rectum. These findings are nonspecific and could represent underlying infectious or inflammatory process. No evidence of fistula or abscess, as clinically questioned.      - The proximal bowel anastomosis measures up to 7.3 cm. There is no upstream bowel dilation. Recommend correlation with symptoms.      - 1.7 cm exophytic right renal mass, which is suspicious for cortical neoplasm. Recommend dedicated MRI or CT (with renal protocol) for further evaluation.     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     Last pouchoscopy     07/2022:  Perianal exam notable for perianal fistula with                          active purulent drainage.                         - Rectal exam showed NO anal anastomotic stricture.                         - Rectal cuff was normal.                         - Mild inflammation in the pouch body with a several,                          shallow broad ulcers near the pouch inlet and at the                          pouch inlet                         - Neo-terminal ileum was normal for 40+ cm.                         - Ileo-ileal anastomosis was reached with likely                          chronic bowel dilation at the anastomotic site.        08/2018:  The perianal and digital rectal examinations were normal.       The neo-terminal ileum appeared normal. Biopsies were taken with a cold        forceps for histology.       Mild inflammation characterized by erosions was found in the distal        ileoanal pouch. This was graded as Pouchitis Disease Activity Index        (Endoscopic) Score 2. Biopsies were taken with a cold forceps for        histology.       There was a rectal cuff beginning at 1 cm from the anal verge,        characterized by healthy appearing mucosa. The cuff extended 2 cm in        length.    A: Pre-pouch, biopsy  - Small intestinal mucosa with no specific pathologic abnormality     B: Pouch, biopsy  - Small intestinal mucosa with mild Paneth cell hyperplasia and focal mild superficial acute inflammation, negative for dysplasia  - No CMV cytopathic effect or granulomas identified  01/2015: No significant inflammation pouch, prepouch or cuff.     Pathology from his colectomy (2003):   Colon, colectomy  - Moderate chronic active colitis with morphologic features consistent with ulcerative colitis  - Distal margin of resection involved with chronic active colitis  - Acute serositis      ASSESSMENT:        1. 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 Chronic antibiotics-dependent pouchitis. On chronic ciprofloxacin therapy       Course of Disease:    Initially diagnosed with UC in 1995.    Between 1995 and 2002, he was treated with different 5-ASA compound, azathioprine, and systemic steroids.    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   07/2014 he underwent hernia repair and while hospitalized developed worsening diarrhea.  Treated with flagyl for 14 days along with his chronic Cipro therapy.  Responded initially but with recurrent symptoms as he completed the course.  Received a second round of flagyl (3/27-4/5), then went off both Cipro and flagyl (ran out the prescription for Cipro).  This resulted in worsening diarrhea.    10/04/2014 first presentation at the pouch clinic. He was started on Cipro and flagyl for 2 weeks, then alternate between the 2 for a week at a time.  Flagyl no effect later on 01/2015 start alternating doxy and Cipro, attempted initiation of Hyoscyamine   04/2015 Cipro mono due to no effectiveness of doxy, start of desipramine.   08/2014 his Desipramine was tapered to 10 mg po daily, which he has tolerated much better. He is having 4-6 BM per day.    05/2017 doing well on the combination of Cipro, hyoscyamine, desipramine low-dose 10 mg daily and Imodium   08/2018 severe diarrhea, switch to Entocort 9 mg and Cipro   10/2018 reoccurrence of diarrhea while on 6 mg of Entocort, increased bloating, Cipro stops working on steroid taper   01/2019 desipramine 10 mg to amitriptyline 25 mg, cefdinir 1 tablet twice daily as well as continue the budesonide 9 mg daily, later on switch back to Cipro twice daily   02/2019 steroid taper   03/2019 start of liraglutide with decrease  of bowel frequency as well as Levaquin.    04/2019 switch back to Cipro, budesonide 9 mg as well as continuation of liraglutide.  01/2020 E. coli sepsis, seizure, new onset of diabetes.   07/2020: Stopped Entocort due to new onset IDDM. Also developed severe joint pains that improved significantly after cutting back on Cipro to 0.5 tab/day.  Later on trial of liraglutide which seems to work.    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 But developed significant large joint pains and had to cut back on dose.  - Cefdinir: Responded well for a couple of weeks, then stopped working.  - Doxycycline: No response  - Flagyl: No response  - Bactrim, Augmentin: Not tried to due sulfa and penicillin allergies.  - Tinidazole: Prescribed but could not take due to cost issue.  - Vancomycin: Prescribed recently but could not take due to cost issue    Other medications:  - Entocort: Was on it for a long time, but had to stop due to new onset IDDM  - Using Imodium prn    Pouch/ perianal abscess/ intersphincteric fistula: Stable on Cipro bid, now reduction to once daily. Will increase Bentyl to up to 4 times as needed. Lomotil 2 tabs daily.    Hidradenitis suppurative: Since age 62 recurrent boils axilla.     Acute epigastric abdominal pain: Obtain lipase and labs today    Right papillary renal cell cancer: Cryotherapy in 09/2022, in follow-up     Stroke: Patient suffered hemorrhagic stroke end of June 2022, was hospitalized for 4 weeks with excellent rehabilitation.      Diabetes: Currently not on medication, last hemoglobin A1c 6.4.     Labs : Will obtain labs today     Reflux: Currently no problem on PPI.    IBD health maintenance:  Influenza vaccine:  nd  Pneumonia vaccine:Prevnar 20 08/2022  Hepatitis B: pending  TB testing: pending  Chickenpox/Shingles history: 1st Shingrix today  Bone densitometry:  nd       Recommendations:     Patient Instructions   Continue Skyrizi every 8 weeks  Continue Cipro 500 mg twice daily along with Bentyl and Lomotil as needed  Obtain labs along with lipase due to new abdominal pain  Prevention: Obtain 1st Shingrix today (second in 2-6 months); Recommend annual Influenza vaccine  Return in 6 months or sooner if needed.        Patient seen and discussed with Dr. Gwenith Spitz.         Victorino Sparrow  IBD Nurse Practitioner      I personally spent 45 minutes face-to-face and non-face-to-face in the care of this patient, which includes all pre, intra, and post visit time on the date of service.  All documented time was specific to the E/M visit and does not include any procedures that may have been performed.

## 2023-04-30 ENCOUNTER — Ambulatory Visit
Admit: 2023-04-30 | Discharge: 2023-04-30 | Payer: BLUE CROSS/BLUE SHIELD | Attending: Gastroenterology | Primary: Gastroenterology

## 2023-04-30 ENCOUNTER — Ambulatory Visit: Admit: 2023-04-30 | Discharge: 2023-04-30 | Payer: BLUE CROSS/BLUE SHIELD

## 2023-04-30 DIAGNOSIS — R1013 Epigastric pain: Principal | ICD-10-CM

## 2023-04-30 DIAGNOSIS — K50018 Crohn's disease of small intestine with other complication: Principal | ICD-10-CM

## 2023-04-30 DIAGNOSIS — K611 Rectal abscess: Principal | ICD-10-CM

## 2023-04-30 DIAGNOSIS — Z23 Encounter for immunization: Principal | ICD-10-CM

## 2023-04-30 LAB — CBC W/ AUTO DIFF
BASOPHILS ABSOLUTE COUNT: 0 10*9/L (ref 0.0–0.1)
BASOPHILS RELATIVE PERCENT: 0.8 %
EOSINOPHILS ABSOLUTE COUNT: 0.2 10*9/L (ref 0.0–0.5)
EOSINOPHILS RELATIVE PERCENT: 6 %
HEMATOCRIT: 37.6 % — ABNORMAL LOW (ref 39.0–48.0)
HEMOGLOBIN: 12.7 g/dL — ABNORMAL LOW (ref 12.9–16.5)
LYMPHOCYTES ABSOLUTE COUNT: 1.7 10*9/L (ref 1.1–3.6)
LYMPHOCYTES RELATIVE PERCENT: 41.5 %
MEAN CORPUSCULAR HEMOGLOBIN CONC: 33.9 g/dL (ref 32.0–36.0)
MEAN CORPUSCULAR HEMOGLOBIN: 30.7 pg (ref 25.9–32.4)
MEAN CORPUSCULAR VOLUME: 90.6 fL (ref 77.6–95.7)
MEAN PLATELET VOLUME: 8 fL (ref 6.8–10.7)
MONOCYTES ABSOLUTE COUNT: 0.3 10*9/L (ref 0.3–0.8)
MONOCYTES RELATIVE PERCENT: 7.7 %
NEUTROPHILS ABSOLUTE COUNT: 1.8 10*9/L (ref 1.8–7.8)
NEUTROPHILS RELATIVE PERCENT: 44 %
PLATELET COUNT: 179 10*9/L (ref 150–450)
RED BLOOD CELL COUNT: 4.15 10*12/L — ABNORMAL LOW (ref 4.26–5.60)
RED CELL DISTRIBUTION WIDTH: 15.3 % — ABNORMAL HIGH (ref 12.2–15.2)
WBC ADJUSTED: 4.1 10*9/L (ref 3.6–11.2)

## 2023-04-30 LAB — IRON & TIBC
IRON SATURATION: 27 % (ref 20–55)
IRON: 77 ug/dL
TOTAL IRON BINDING CAPACITY: 282 ug/dL (ref 250–425)

## 2023-04-30 LAB — COMPREHENSIVE METABOLIC PANEL
ALBUMIN: 4.1 g/dL (ref 3.4–5.0)
ALKALINE PHOSPHATASE: 76 U/L (ref 46–116)
ALT (SGPT): 15 U/L (ref 10–49)
ANION GAP: 8 mmol/L (ref 5–14)
AST (SGOT): 19 U/L (ref <=34)
BILIRUBIN TOTAL: 0.7 mg/dL (ref 0.3–1.2)
BLOOD UREA NITROGEN: 15 mg/dL (ref 9–23)
BUN / CREAT RATIO: 12
CALCIUM: 10.3 mg/dL (ref 8.7–10.4)
CHLORIDE: 110 mmol/L — ABNORMAL HIGH (ref 98–107)
CO2: 23.6 mmol/L (ref 20.0–31.0)
CREATININE: 1.26 mg/dL — ABNORMAL HIGH
EGFR CKD-EPI (2021) MALE: 65 mL/min/{1.73_m2} (ref >=60)
GLUCOSE RANDOM: 100 mg/dL (ref 70–179)
POTASSIUM: 4 mmol/L (ref 3.4–4.8)
PROTEIN TOTAL: 7.5 g/dL (ref 5.7–8.2)
SODIUM: 142 mmol/L (ref 135–145)

## 2023-04-30 LAB — FERRITIN: FERRITIN: 102 ng/mL

## 2023-04-30 LAB — LIPASE: LIPASE: 39 U/L (ref 12–53)

## 2023-04-30 NOTE — Unmapped (Addendum)
Continue Skyrizi every 8 weeks  Continue Cipro 500 mg twice daily along with Bentyl and Lomotil as needed  Obtain labs along with lipase due to new abdominal pain  Prevention: Obtain 1st Shingrix today (second in 2-6 months); Recommend annual Influenza vaccine  Return in 6 months or sooner if needed.

## 2023-05-23 NOTE — Unmapped (Signed)
[facility] Refill Coordination Note    Tony Andrade, DOB: 1960-10-05  Phone: [phone] (home)       All above HIPAA information was verified with patient.         05/22/2023     3:27 PM   Specialty Rx Medication Refill Questionnaire   Which Medications would you like refilled and shipped? I would like to receive my next infusion of Skyrizi. But I do need to let you all know that my address is changed. My new address is, [address] [place], [place] [address].   Please list all current allergies: PCN, Asacol, bee stings,sulfa drugs.   Have you missed any doses in the last 30 days? No   Have you had any changes to your medication(s) since your last refill? No   How many days remaining of each medication do you have at home? I have a sufficient supply of my other medications.   If receiving an injectable medication, next injection date is 06/03/2023   Have you experienced any side effects in the last 30 days? No   Please enter the full address (street address, city, state, zip code) where you would like your medication(s) to be delivered to. [address] [place], [place] [address]   Please specify on which day you would like your medication(s) to arrive. Note: if you need your medication(s) within 3 days, please call the pharmacy to schedule your order at [phone]  05/26/2023   Has your insurance changed since your last refill? No   Would you like a pharmacist to call you to discuss your medication(s)? No   Do you require a signature for your package? (Note: if we are billing Medicare Part B or your order contains a controlled substance, we will require a signature) No   Additional Comments: I will be able to make sure someone gets it as soon as it gets here.         Completed refill call assessment today to schedule patient's medication shipment from the [facility] [phone]).  All relevant notes have been reviewed.       Confirmed patient received a Conservation officer, historic buildings and a Surveyor, mining with first shipment. The patient will receive a drug information handout for each medication shipped and additional FDA Medication Guides as required.         REFERRAL TO PHARMACIST     Referral to the pharmacist: Not needed      [facility]     Shipping address confirmed in Epic.     Delivery Scheduled: Yes, Expected medication delivery date: 05/26/23.     Medication will be delivered via UPS to the prescription address in Epic WAM.    Willette Pa   [facility] Specialty Technician

## 2023-05-25 MED FILL — SKYRIZI 360 MG/2.4 ML (150 MG/ML) SUBCUTANEOUS WEARABLE INJECTOR: 56 days supply | Qty: 2.4 | Fill #2

## 2023-05-28 ENCOUNTER — Telehealth: Admit: 2023-05-28 | Discharge: 2023-05-29 | Payer: BLUE CROSS/BLUE SHIELD

## 2023-05-28 DIAGNOSIS — R197 Diarrhea, unspecified: Principal | ICD-10-CM

## 2023-05-28 DIAGNOSIS — K50018 Crohn's disease of small intestine with other complication: Principal | ICD-10-CM

## 2023-05-28 DIAGNOSIS — K91850 Pouchitis: Principal | ICD-10-CM

## 2023-05-28 MED ORDER — METRONIDAZOLE 500 MG TABLET
ORAL_TABLET | Freq: Two times a day (BID) | ORAL | 0 refills | 10.00 days | Status: CP
Start: 2023-05-28 — End: ?

## 2023-05-28 MED ORDER — RIFAXIMIN 550 MG TABLET
ORAL_TABLET | Freq: Two times a day (BID) | ORAL | 0 refills | 14.00 days | Status: CP
Start: 2023-05-28 — End: ?

## 2023-05-28 NOTE — Unmapped (Signed)
[facility] Gastrointestinal Encounter  This medical encounter was conducted virtually using [email or web address]  [email or web address] protocols.    Patient ID: Tony Andrade is a 62 y.o. male who presents by video interaction for evaluation.    I have identified myself to the patient and conveyed my credentials to Tony Andrade.   Patient has signed informed consent on file in medical record.    Present on Video Call: Is there someone else in the room? No..    Assessment/Plan:    Diagnoses and all orders for this visit:    Acute diarrhea  -     metroNIDAZOLE (FLAGYL) 500 MG tablet; Take 1 tablet (500 mg total) by mouth two (2) times a day.    Crohn's disease of small intestine with other complication (CMS-HCC)    Pouchitis (CMS-HCC)    - instructed to call his gastroenterologist to follow up      -- Discussed the new prescription noted above, including potential side effects, drug interactions, instructions for taking the medication, and the consequences of not taking it.  -- Patient verbalized an understanding of today's assessment and recommendations, as well as the purpose of ongoing medications.    Follow-up with Specialist         Medication adherence and barriers to the treatment plan have been addressed. Opportunities to optimize healthy behaviors have been discussed. Patient / caregiver voiced understanding.        Subjective:     HPI  Tony Andrade is 62 y.o. and presents today in the [facility] with gastrointestinal symptoms.  The PCP for this patient is Woodfin Ganja, MD.  Oct 25th, was prescribed Flagyl.   Is on Skyrizi.   The past week has had diarrhea after a family gathering with lots of gathered food. He believes he has had something since the second meal they had together.   Liquid diarrhea, gas, bloating. No fevers.   Dr. Marilynn Rail normally prescribes him Flagyl. Has been his GI physician for the last 20 yrs.   Flagyl 500 mg BID x 10 days.   He takes Cipro long term.   He has plenty of Loperamide. Also Lomotil.           ROS  Review of Systems   Gastrointestinal:  Positive for diarrhea.        All other ROS per HPI.    I have reviewed the problem list, past medical history, past family history, medications, and allergies and have updated/reconciled them if needed.          Objective:   Physical Exam  As part of this Video Visit, no in-person exam was conducted.  Video interaction permitted the following observations.    General: No acute distress.    HEENT: No scleral icterus.  EOMI.   RESP: Relaxed respiratory effort. No conversational dyspnea.   SKIN: No rashes noted.  ABD: Non-distended, but bloated.   NEURO: Normal coordination.  No tremors observed.  PSYCH: Alert and oriented.  Speech fluent and sensible.  Calm affect.            The patient reports they are physically located in [place] and is currently: at home. I conducted a audio/video visit. I spent  65m 33s on the video call with the patient. I spent an additional 6 minutes on pre- and post-visit activities on the date of service .

## 2023-05-28 NOTE — Unmapped (Signed)
Patient contacted with increased gas, bloating, abdominal pain and frequency for the past 5days.  Discussed with Dr. Gwenith Spitz, will add xifaxan 550mg  BID X14days now, hold cipro while on xifaxan.  Update prn.     Patient also contacted oncall urgent line and was giving script for flagyl 500mg  BID X10days.  He may get this first due to need for PA for flagyl, ok to try as alternative to xifaxan if patient prefers, cost is better.

## 2023-06-02 NOTE — Unmapped (Addendum)
Xifaxan denied by insurance. Patient was able to start flagyl and reports significant improvement in symptoms, he will update if symptoms recur at end of 10day course.       Xifaxan DENIAL added to media tab

## 2023-06-22 DIAGNOSIS — R197 Diarrhea, unspecified: Principal | ICD-10-CM

## 2023-06-22 MED ORDER — METRONIDAZOLE 500 MG TABLET
ORAL_TABLET | Freq: Two times a day (BID) | ORAL | 0 refills | 30.00 days | Status: CP
Start: 2023-06-22 — End: 2023-07-22

## 2023-06-24 NOTE — Unmapped (Signed)
Bloating, abdominal discomfort resumed after completion of flagly course.  Patient has some xifaxan at home and stopped cipro, start xifaxn 550mg  BID on 1/6 and feels improved, He will complete 14 day course with plan for follow up with Dr. Gwenith Spitz in 2 weeks. Patient verbalized understanding and in agreement with plan.

## 2023-07-08 NOTE — Unmapped (Signed)
It was medically necessary for me to see the patient in addition to the APP because of the complexity of Tony Andrade case and lack of response to previous therapy.  My visit included a discussion of the clinical  symptoms, a consideration of further testing, and evaluation of treatment options.     Britt Bolognese, MD, PhD   [facility] CONSULTATION VISIT      REFERRING PROVIDER:  Pcp, None Per Patient  [address] [place],  [place] [address]    PRIMARY CARE PROVIDER:  Woodfin Ganja, MD      PATIENT PROFILE:        Tony Andrade is a 63 y.o. male (DOB: [birth year]) who is seen in follow up for evaluation of pouchitis.     CHIEF COMPLAINT: follow-up      HISTORY OF PRESENT ILLNESS: Tony Andrade is a 63 y.o. 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      Patient reports Tony Andrade has not been working as well as it was in November. He is undergoing significant stress regarding struggling financially recently. 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 He also has been applying for disability. He was recently approved but has not started to receive payments yet. 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 Additionally, his diet has not been the greatest over the holidays which makes symptoms worse. In fact he was placed on Xifaxan 10 day course for acute diarrhea after a family gathering (initially prescribed Flagyl but had leftover Xifaxan and took it).     He has had an upper respiratory infection for the last month that has progressed to a productive cough of yellow phlegm. He denies fevers and thought that he would be okay since he was on antibiotic the last month for his pouch.    From a GI perspective, he continues on the Blaine every 8 weeks with the last dose 06/03/23. He reports that he is having 5-6 loose bowel movements without blood or urgency but some cramping. He did have to get up 5-6x last night for BMs. He also states that his abdomen is distended. He took Cipro this morning ate breakfast that included 2 chocolate donuts and within 30 minutes his belly grew in size. He took an additional Xifaxan and within the hour drive his abdomen decreased. He also has been taking Lomotil 1 tab twice daily to help with frequency.     Lastly, he does have a fistula on his left buttock that is without fullness, pain or drainage. He did have that 3 months ago and took Flagyl that resolved this. No HS flares.    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     PAST MEDICAL HISTORY:    Past Medical History:   Diagnosis Date    GERD (gastroesophageal reflux disease)     H/O colectomy     Hypertension     Pouchitis (CMS-HCC)     Stroke (CMS-HCC)     Ulcerative colitis (CMS-HCC)        MEDICATIONS:      Current Outpatient Medications:     acetaminophen (TYLENOL) 500 MG tablet, Take 1 tablet (500 mg total) by mouth every six (6) hours as needed for pain., Disp: , Rfl:     activated charcoal, bulk, Powd, by Miscellaneous route daily as needed., Disp: , Rfl:     ascorbic acid, vitamin C, (VITAMIN C) 500 MG tablet, Take 1 tablet (500 mg total) by mouth two (2) times a day., Disp: , Rfl:     cefdinir (OMNICEF) 300 MG capsule, Take 1 capsule (300 mg total) by mouth two (2) times a day for 14 days., Disp: 28 capsule, Rfl: 0    cetirizine (ZYRTEC) 10 MG tablet, Take 1 tablet (10 mg total) by mouth daily., Disp: , Rfl:     cholecalciferol, vitamin D3-1,250 mcg, 50,000 unit,, 1,250 mcg (50,000 unit) capsule, Take 1 capsule (1,250 mcg total) by mouth once a week., Disp: 12 capsule, Rfl: 1    ciprofloxacin HCl (CIPRO) 500 MG tablet, Take 1 tablet (500 mg total) by mouth two (2) times a day., Disp: 60 tablet, Rfl: 2    cyanocobalamin 100 MCG tablet, Take 1 tablet (100 mcg total) by mouth daily., Disp: , Rfl:     dicyclomine (BENTYL) 10 mg capsule, Take 1 capsule (10 mg total) by mouth four (4) times a day as needed., Disp: 120 capsule, Rfl: 2    diphenoxylate-atropine (LOMOTIL) 2.5-0.025 mg per tablet, Take 1 tablet by mouth four (4) times a day as needed for diarrhea., Disp: 240 tablet, Rfl: 2    empty container Misc, Use as directed, Disp: 1 each, Rfl: 3    ezetimibe (ZETIA) 10 mg tablet, Take 1 tablet (10 mg total) by mouth daily., Disp: , Rfl:     famotidine (PEPCID) 40 MG tablet, Take 1 tablet (40 mg total) by mouth daily as needed for heartburn., Disp: , Rfl:     glucosamine sulfate (GLUCOSAMINE ORAL), Take 1 tablet by mouth daily at 0600., Disp: , Rfl:     hydrALAZINE (APRESOLINE) 50 MG tablet, Take 2 tablets (100 mg total) by mouth two (2) times a day., Disp: , Rfl:     hydrocortisone (ANUSOL-HC) 2.5 % rectal cream, Insert into the rectum two (2) times a day., Disp: 30 g, Rfl: 0    ketoconazole (NIZORAL) 2 % cream, Two (2) times a day., Disp: , Rfl:     Lactobacillus acidophilus (PROBIOTIC ORAL), Take 1 capsule by mouth daily at 0600., Disp: , Rfl:     lisinopril (PRINIVIL,ZESTRIL) 5 MG tablet, Take 1 tablet (5 mg total) by mouth daily., Disp: , Rfl:     loperamide (IMODIUM) 2 mg capsule, Take 2 capsules (4 mg total) by mouth four (4) times a day as needed for diarrhea., Disp: 720 capsule, Rfl:  2    melatonin 5 mg cap, Take 10 mg by mouth nightly., Disp: , Rfl:     methocarbamoL (ROBAXIN) 500 MG tablet, Take 1 tablet (500 mg total) by mouth every eight (8) hours as needed., Disp: , Rfl:     metroNIDAZOLE (FLAGYL) 500 MG tablet, Take 1 tablet (500 mg total) by mouth two (2) times a day., Disp: 60 tablet, Rfl: 0    multivitamin with minerals tablet, Take 1 tablet by mouth at bedtime., Disp: , Rfl:     OMEGA-3/DHA/EPA/FISH OIL (FISH OIL-OMEGA-3 FATTY ACIDS) 300-1,000 mg capsule, Take 2 capsules (2 g total) by mouth Two (2) times a day., Disp: , Rfl:     pantoprazole (PROTONIX) 40 MG tablet, Take 1 tablet (40 mg total) by mouth daily., Disp: , Rfl:     QUEtiapine (SEROQUEL) 25 MG tablet, Take 1 tablet (25 mg total) by mouth nightly., Disp: , Rfl:     rifAXIMin (XIFAXAN) 550 mg Tab, Take 1 tablet (550 mg total) by mouth two (2) times a day., Disp: 28 tablet, Rfl: 0    risankizumab-rzaa (SKYRIZI) 360 mg/2.4 mL (150 mg/mL) Injt, Inject the contents of 1 cartridge (360mg ) under the skin every 8 weeks, Disp: 2.4 mL, Rfl: 2    UNABLE TO FIND, Take 1 packet by mouth daily. Electrolyte Stamina  Power Pack, Disp: , Rfl:       ALLERGIES:    Mesalamine, Penicillins, Sulfa (sulfonamide antibiotics), Venom-honey bee, Atorvastatin, and Iodinated contrast media    SOCIAL HISTORY:    Social History     Socioeconomic History    Marital status: Single     Spouse name: None    Number of children: None    Years of education: None    Highest education level: None   Tobacco Use    Smoking status: Never    Smokeless tobacco: Never   Substance and Sexual Activity    Alcohol use: Yes    Drug use: No   Social History Narrative    Incarcerated in minimum security prison.  No job currently.       Social Drivers of Psychologist, prison and probation services Strain: Low Risk  (01/07/2023)    Received from [facility]    Overall Financial Resource Strain (CARDIA)     Difficulty of Paying Living Expenses: Not very hard   Food Insecurity: Not on File (03/12/2023)    Received from [employer] Insecurity     Food: 0   Transportation Needs: No Transportation Needs (01/07/2023)    Received from [facility] - Transportation     Lack of Transportation (Medical): No     Lack of Transportation (Non-Medical): No   Physical Activity: Not on File (10/03/2022)    Received from [facility]    Physical Activity     Physical Activity: 0   Stress: Not on File (10/03/2022)    Received from [facility]    Stress     Stress: 0   Social Connections: Not on File (03/02/2023)    Received from [employer]     Connectedness: 0       FAMILY HISTORY:    family history includes Other in his mother; Stroke in his father.     VITAL SIGNS:    BP 125/77 (BP Site: L Arm, BP Position: Sitting)  - Pulse 96  - Temp 36.4 ??C (97.5 ??F) (Temporal)  - Ht 177.8 cm (  5' 10)  - Wt 97.7 kg (215 lb 6.4 oz)  - BMI 30.91 kg/m??      BP Readings from Last 3 Encounters:   07/09/23 125/77   04/30/23 110/80   11/28/22 121/81      Wt Readings from Last 3 Encounters:   07/09/23 97.7 kg (215 lb 6.4 oz)   04/30/23 95.6 kg (210 lb 12.8 oz)   11/28/22 95.7 kg (211 lb)      BMI: Estimated body mass index is 30.91 kg/m?? as calculated from the following:    Height as of this encounter: 177.8 cm (5' 10).    Weight as of this encounter: 97.7 kg (215 lb 6.4 oz).    BSA: Estimated body surface area is 2.2 meters squared as calculated from the following:    Height as of this encounter: 177.8 cm (5' 10).    Weight as of this encounter: 97.7 kg (215 lb 6.4 oz).    PHYSICAL EXAM:    Constitutional:   Alert, oriented x 3, no acute distress, well nourished, and well hydrated.   Mental Status:   Thought organized, appropriate affect, pleasantly interactive, not anxious appearing.   HEENT:   PEERL, conjunctiva clear, anicteric, oropharynx clear, neck supple, no LAD.   Respiratory: Rales bilateral upper lobes, unlabored breathing, + cough     Cardiac: Euvolemic, regular rate and rhythm, normal S1 and S2, no murmur.     Abdomen: Soft, normal bowel sounds, non-distended, TTP UQ, no organomegaly or masses.     Perianal/Rectal Exam Not done      Extremities:   No edema, well perfused.   Musculoskeletal: No joint swelling or tenderness noted, no deformities.     Skin: No rashes, jaundice or skin lesions noted. Scar left armpit, history of boils.      Neuro: No focal deficits.            DIAGNOSTIC STUDIES:  I have reviewed all pertinent diagnostic studies, including:    Imaging:    MRI Abdomen  Impression: Postablation changes of the right kidney without evidence of locally   recurrent or metastatic disease to the abdomen.     MRI Pelvis 07/2022:  Impression: Posterior perianal abscess in the intersphincteric space measuring up   to 2.1 cm.   This is associated with a left posterolateral, intersphincteric perianal   fistula which also extends inferiorly, terminating at the skin surface of   the medial left gluteal fold where there is a superficial, subcentimeter   fluid collection, likely the fistula drainage site and/or small   subcutaneous abscess.     CT 07/2022  Inflammatory changes are seen adjacent to the rectum. These findings are nonspecific and could represent underlying infectious or inflammatory process. No evidence of fistula or abscess, as clinically questioned.      - The proximal bowel anastomosis measures up to 7.3 cm. There is no upstream bowel dilation. Recommend correlation with symptoms.      - 1.7 cm exophytic right renal mass, which is suspicious for cortical neoplasm. Recommend dedicated MRI or CT (with renal protocol) for further evaluation.     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     Last pouchoscopy     07/2022:  Perianal exam notable for perianal fistula with active purulent drainage.                         -  Rectal exam showed NO anal anastomotic stricture.                         - Rectal cuff was normal.                         - Mild inflammation in the pouch body with a several,                          shallow broad ulcers near the pouch inlet and at the                          pouch inlet                         - Neo-terminal ileum was normal for 40+ cm.                         - Ileo-ileal anastomosis was reached with likely                          chronic bowel dilation at the anastomotic site.        08/2018:   The perianal and digital rectal examinations were normal.       The neo-terminal ileum appeared normal. Biopsies were taken with a cold        forceps for histology.       Mild inflammation characterized by erosions was found in the distal        ileoanal pouch. This was graded as Pouchitis Disease Activity Index        (Endoscopic) Score 2. Biopsies were taken with a cold forceps for        histology.       There was a rectal cuff beginning at 1 cm from the anal verge,        characterized by healthy appearing mucosa. The cuff extended 2 cm in        length.    A: Pre-pouch, biopsy  - Small intestinal mucosa with no specific pathologic abnormality     B: Pouch, biopsy  - Small intestinal mucosa with mild Paneth cell hyperplasia and focal mild superficial acute inflammation, negative for dysplasia  - No CMV cytopathic effect or granulomas identified  01/2015: No significant inflammation pouch, prepouch or cuff.     Pathology from his colectomy (2003):   Colon, colectomy  - Moderate chronic active colitis with morphologic features consistent with ulcerative colitis  - Distal margin of resection involved with chronic active colitis  - Acute serositis      Labs    Office Visit on 07/09/2023   Component Date Value Ref Range Status    CRP 07/09/2023 <4.0  <=10.0 mg/L Final    Sodium 07/09/2023 142  135 - 145 mmol/L Final    Potassium 07/09/2023 3.8  3.4 - 4.8 mmol/L Final    Chloride 07/09/2023 103  98 - 107 mmol/L Final    CO2 07/09/2023 25.0  20.0 - 31.0 mmol/L Final    Anion Gap 07/09/2023 14  5 - 14 mmol/L Final    BUN 07/09/2023 12  9 - 23 mg/dL Final    Creatinine 16/03/9603 1.25 (H)  0.73 - 1.18 mg/dL Final    BUN/Creatinine Ratio 07/09/2023  10   Final    eGFR CKD-EPI (2021) Male 07/09/2023 66  >=60 mL/min/1.35m2 Final    Glucose 07/09/2023 116  70 - 179 mg/dL Final    Calcium 16/03/9603 10.6 (H)  8.7 - 10.4 mg/dL Final    Albumin 54/02/8118 4.3  3.4 - 5.0 g/dL Final    Total Protein 07/09/2023 8.4 (H)  5.7 - 8.2 g/dL Final    Total Bilirubin 07/09/2023 0.4  0.3 - 1.2 mg/dL Final    AST 14/78/2956 20  <=34 U/L Final    ALT 07/09/2023 13  10 - 49 U/L Final    Alkaline Phosphatase 07/09/2023 88  46 - 116 U/L Final    WBC 07/09/2023 5.3  3.6 - 11.2 10*9/L Final    RBC 07/09/2023 4.64  4.26 - 5.60 10*12/L Final    HGB 07/09/2023 14.1  12.9 - 16.5 g/dL Final    HCT 21/30/8657 42.3  39.0 - 48.0 % Final    MCV 07/09/2023 91.1  77.6 - 95.7 fL Final    MCH 07/09/2023 30.4  25.9 - 32.4 pg Final    MCHC 07/09/2023 33.4  32.0 - 36.0 g/dL Final    RDW 84/69/6295 15.5 (H)  12.2 - 15.2 % Final    MPV 07/09/2023 8.0  6.8 - 10.7 fL Final    Platelet 07/09/2023 216  150 - 450 10*9/L Final    Neutrophils % 07/09/2023 53.2  % Final    Lymphocytes % 07/09/2023 30.4  % Final    Monocytes % 07/09/2023 6.6  % Final    Eosinophils % 07/09/2023 8.7  % Final    Basophils % 07/09/2023 1.1  % Final    Absolute Neutrophils 07/09/2023 2.8  1.8 - 7.8 10*9/L Final    Absolute Lymphocytes 07/09/2023 1.6  1.1 - 3.6 10*9/L Final    Absolute Monocytes 07/09/2023 0.3  0.3 - 0.8 10*9/L Final    Absolute Eosinophils 07/09/2023 0.5  0.0 - 0.5 10*9/L Final    Absolute Basophils 07/09/2023 0.1  0.0 - 0.1 10*9/L Final        ASSESSMENT:        1. 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 Chronic antibiotics-dependent pouchitis. On chronic ciprofloxacin therapy       Course of Disease:    Initially diagnosed with UC in 1995.    Between 1995 and 2002, he was treated with different 5-ASA compound, azathioprine, and systemic steroids.    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   07/2014 he underwent hernia repair and while hospitalized developed worsening diarrhea.  Treated with flagyl for 14 days along with his chronic Cipro therapy.  Responded initially but with recurrent symptoms as he completed the course.  Received a second round of flagyl (3/27-4/5), then went off both Cipro and flagyl (ran out the prescription for Cipro).  This resulted in worsening diarrhea.    10/04/2014 first presentation at the pouch clinic. He was started on Cipro and flagyl for 2 weeks, then alternate between the 2 for a week at a time.  Flagyl no effect later on 01/2015 start alternating doxy and Cipro, attempted initiation of Hyoscyamine   04/2015 Cipro mono due to no effectiveness of doxy, start of desipramine.   08/2014 his Desipramine was tapered  to 10 mg po daily, which he has tolerated much better. He is having 4-6 BM per day.    05/2017 doing well on the combination of Cipro, hyoscyamine, desipramine low-dose 10 mg daily and Imodium   08/2018 severe diarrhea, switch to Entocort 9 mg and Cipro   10/2018 reoccurrence of diarrhea while on 6 mg of Entocort, increased bloating, Cipro stops working on steroid taper   01/2019 desipramine 10 mg to amitriptyline 25 mg, cefdinir 1 tablet twice daily as well as continue the budesonide 9 mg daily, later on switch back to Cipro twice daily   02/2019 steroid taper   03/2019 start of liraglutide with decrease of bowel frequency as well as Levaquin.    04/2019 switch back to Cipro, budesonide 9 mg as well as continuation of liraglutide.  01/2020 E. coli sepsis, seizure, new onset of diabetes.   07/2020: Stopped Entocort due to new onset IDDM. Also developed severe joint pains that improved significantly after cutting back on Cipro to 0.5 tab/day.  Later on trial of liraglutide which seems to work.    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 But developed significant large joint pains and had to cut back on dose.  - Cefdinir: Responded well for a couple of weeks, then stopped working.  - Doxycycline: No response  - Flagyl: No response  - Bactrim, Augmentin: Not tried to due sulfa and penicillin allergies.  - Tinidazole: Prescribed but could not take due to cost issue.  - Vancomycin: Prescribed recently but could not take due to cost issue  - Xifaxan: responded well     Other medications:  - Entocort: Was on it for a long time, but had to stop due to new onset IDDM  - Using Imodium prn    Pouch/ perianal abscess/ intersphincteric fistula: Not stable on Cipro bid. Short course of Cefdinir bid and then return to Cipro bid. Will continue Bentyl to up to 4 times as needed. Will increase Lomotil 2 tabs bid and may need prior to bedtime if nocturnal awakenings continue.    Upper Respiratory Infection: will start Cefdinir 300 mg twice daily for 2 weeks. Continue over the counter medications for supportive measures.    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 Not a problem at this time.    Acute epigastric abdominal pain: Has resolved.    Right papillary renal cell cancer: Cryotherapy in 09/2022, in follow-up     Stroke: Patient suffered hemorrhagic stroke end of June 2022, was hospitalized for 4 weeks with excellent rehabilitation.      Diabetes: Currently not on medication, last hemoglobin A1c 6.0 July 2024.     Labs : Will obtain labs today     Reflux: Currently no problem on PPI.    IBD health maintenance:  Influenza vaccine:  nd  Pneumonia vaccine: Prevnar 20 08/2022  Hepatitis B: pending  TB testing: pending  Chickenpox/Shingles history: 2nd Shingrix today  Bone densitometry:  nd       Recommendations:     Patient Instructions   Continue Skyrizi every 8 weeks  Start Cefdinir 300 mg twice daily for 2 weeks and then return to Cipro twice daily; please update [place] as to how you are doing in 10 days  Will obtain labs today  Use over the counter medications like Delsym for cough control and congestion (something with guaifenesin in it). Follow up with your primary care if your cold/cough does not resolve.  Prevention: Will get 2nd Shingrix shot today  Return in 3 months or sooner if needed        Patient seen and discussed with Dr. Gwenith Spitz.         Victorino Sparrow  IBD Nurse Practitioner      I personally spent 45 minutes face-to-face and non-face-to-face in the care of this patient, which includes all pre, intra, and post visit time on the date of service.  All documented time was specific to the E/M visit and does not include any procedures that may have been performed.

## 2023-07-09 ENCOUNTER — Ambulatory Visit
Admit: 2023-07-09 | Discharge: 2023-07-10 | Payer: PRIVATE HEALTH INSURANCE | Attending: Gastroenterology | Primary: Gastroenterology

## 2023-07-09 DIAGNOSIS — J019 Acute sinusitis, unspecified: Principal | ICD-10-CM

## 2023-07-09 DIAGNOSIS — K91850 Pouchitis: Principal | ICD-10-CM

## 2023-07-09 DIAGNOSIS — Z23 Encounter for immunization: Principal | ICD-10-CM

## 2023-07-09 LAB — COMPREHENSIVE METABOLIC PANEL
ALBUMIN: 4.3 g/dL (ref 3.4–5.0)
ALKALINE PHOSPHATASE: 88 U/L (ref 46–116)
ALT (SGPT): 13 U/L (ref 10–49)
ANION GAP: 14 mmol/L (ref 5–14)
AST (SGOT): 20 U/L (ref <=34)
BILIRUBIN TOTAL: 0.4 mg/dL (ref 0.3–1.2)
BLOOD UREA NITROGEN: 12 mg/dL (ref 9–23)
BUN / CREAT RATIO: 10
CALCIUM: 10.6 mg/dL — ABNORMAL HIGH (ref 8.7–10.4)
CHLORIDE: 103 mmol/L (ref 98–107)
CO2: 25 mmol/L (ref 20.0–31.0)
CREATININE: 1.25 mg/dL — ABNORMAL HIGH (ref 0.73–1.18)
EGFR CKD-EPI (2021) MALE: 66 mL/min/{1.73_m2} (ref >=60)
GLUCOSE RANDOM: 116 mg/dL (ref 70–179)
POTASSIUM: 3.8 mmol/L (ref 3.4–4.8)
PROTEIN TOTAL: 8.4 g/dL — ABNORMAL HIGH (ref 5.7–8.2)
SODIUM: 142 mmol/L (ref 135–145)

## 2023-07-09 LAB — CBC W/ AUTO DIFF
BASOPHILS ABSOLUTE COUNT: 0.1 10*9/L (ref 0.0–0.1)
BASOPHILS RELATIVE PERCENT: 1.1 %
EOSINOPHILS ABSOLUTE COUNT: 0.5 10*9/L (ref 0.0–0.5)
EOSINOPHILS RELATIVE PERCENT: 8.7 %
HEMATOCRIT: 42.3 % (ref 39.0–48.0)
HEMOGLOBIN: 14.1 g/dL (ref 12.9–16.5)
LYMPHOCYTES ABSOLUTE COUNT: 1.6 10*9/L (ref 1.1–3.6)
LYMPHOCYTES RELATIVE PERCENT: 30.4 %
MEAN CORPUSCULAR HEMOGLOBIN CONC: 33.4 g/dL (ref 32.0–36.0)
MEAN CORPUSCULAR HEMOGLOBIN: 30.4 pg (ref 25.9–32.4)
MEAN CORPUSCULAR VOLUME: 91.1 fL (ref 77.6–95.7)
MEAN PLATELET VOLUME: 8 fL (ref 6.8–10.7)
MONOCYTES ABSOLUTE COUNT: 0.3 10*9/L (ref 0.3–0.8)
MONOCYTES RELATIVE PERCENT: 6.6 %
NEUTROPHILS ABSOLUTE COUNT: 2.8 10*9/L (ref 1.8–7.8)
NEUTROPHILS RELATIVE PERCENT: 53.2 %
PLATELET COUNT: 216 10*9/L (ref 150–450)
RED BLOOD CELL COUNT: 4.64 10*12/L (ref 4.26–5.60)
RED CELL DISTRIBUTION WIDTH: 15.5 % — ABNORMAL HIGH (ref 12.2–15.2)
WBC ADJUSTED: 5.3 10*9/L (ref 3.6–11.2)

## 2023-07-09 LAB — C-REACTIVE PROTEIN: C-REACTIVE PROTEIN: <4 mg/L (ref <=10.0)

## 2023-07-09 MED ORDER — CEFDINIR 300 MG CAPSULE
ORAL_CAPSULE | Freq: Two times a day (BID) | ORAL | 0 refills | 14.00 days | Status: CP
Start: 2023-07-09 — End: 2023-07-23

## 2023-07-09 NOTE — Unmapped (Addendum)
Continue Skyrizi every 8 weeks  Start Cefdinir 300 mg twice daily for 2 weeks and then return to Cipro twice daily; please update [place] as to how you are doing in 10 days  Will obtain labs today  Use over the counter medications like Delsym for cough control and congestion (something with guaifenesin in it). Follow up with your primary care if your cold/cough does not resolve.  Prevention: Will get 2nd Shingrix shot today  Return in 3 months or sooner if needed

## 2023-07-15 DIAGNOSIS — K50018 Crohn's disease of small intestine with other complication: Principal | ICD-10-CM

## 2023-07-15 MED ORDER — SKYRIZI 360 MG/2.4 ML (150 MG/ML) SUBCUTANEOUS WEARABLE INJECTOR
5 refills | 0.00 days | Status: CP
Start: 2023-07-15 — End: ?
  Filled 2023-07-27: qty 2.4, 56d supply, fill #0

## 2023-07-15 NOTE — Unmapped (Signed)
Patient is requesting the following refill  Requested Prescriptions     Pending Prescriptions Disp Refills    risankizumab-rzaa (SKYRIZI) 360 mg/2.4 mL (150 mg/mL) Injt 2.4 mL 2     Sig: Inject the contents of 1 cartridge (360mg ) under the skin every 8 weeks       Recent Visits  Date Type Provider Dept   07/09/23 Office Visit [place], Philippa Chester, MD [facility]   04/30/23 Office Visit Herfarth, Philippa Chester, MD [facility]   01/22/23 Telemedicine [place], Milana Na, CPP [facility] [place]   11/28/22 Office Visit Herfarth, Philippa Chester, MD [facility]   08/19/22 Office Visit Herfarth, Philippa Chester, MD [facility]   Showing recent visits within past 365 days and meeting all other requirements  Future Appointments  No visits were found meeting these conditions.  Showing future appointments within next 365 days and meeting all other requirements        Lab Results   Component Value Date    WBC 5.3 07/09/2023    RBC 4.64 07/09/2023    HGB 14.1 07/09/2023    HCT 42.3 07/09/2023    PLT 216 07/09/2023    ALT 13 07/09/2023    AST 20 07/09/2023    ALKPHOS 88 07/09/2023    CRP <4.0 07/09/2023    CREATININE 1.25 (H) 07/09/2023        refills authorized x  6 months

## 2023-07-20 NOTE — Telephone Encounter (Signed)
Copied from CRM [id number]. Topic: General - Call Back - No Documentation >> Jul 20, 2023  1:46 PM Irine Seal wrote: Reason for CRM: patient returning missed call, checked all encounters and notes and could not determine a reason for a call outside of an appt reminder. If it was for any other reason please call patient back at [phone]  Spoke to patient to clarify missed call and if any questions or concerns are needed. Patient have no concerns or questions and informed him of follow up appointment on 07/21/2023 with Dr. Janee Morn MD. Patient verbalized understanding.

## 2023-07-21 ENCOUNTER — Ambulatory Visit: Payer: No Typology Code available for payment source | Admitting: Family Medicine

## 2023-07-21 ENCOUNTER — Encounter: Payer: Self-pay | Admitting: Family Medicine

## 2023-07-21 VITALS — BP 138/88 | HR 74 | Temp 97.7°F | Ht 71.0 in | Wt 212.2 lb

## 2023-07-21 DIAGNOSIS — E118 Type 2 diabetes mellitus with unspecified complications: Secondary | ICD-10-CM

## 2023-07-21 DIAGNOSIS — E559 Vitamin D deficiency, unspecified: Secondary | ICD-10-CM

## 2023-07-21 DIAGNOSIS — N182 Chronic kidney disease, stage 2 (mild): Secondary | ICD-10-CM | POA: Diagnosis not present

## 2023-07-21 DIAGNOSIS — E1122 Type 2 diabetes mellitus with diabetic chronic kidney disease: Secondary | ICD-10-CM

## 2023-07-21 DIAGNOSIS — K219 Gastro-esophageal reflux disease without esophagitis: Secondary | ICD-10-CM

## 2023-07-21 DIAGNOSIS — I10 Essential (primary) hypertension: Secondary | ICD-10-CM | POA: Diagnosis not present

## 2023-07-21 DIAGNOSIS — E782 Mixed hyperlipidemia: Secondary | ICD-10-CM | POA: Diagnosis not present

## 2023-07-21 DIAGNOSIS — Z5987 Material hardship due to limited financial resources, not elsewhere classified: Secondary | ICD-10-CM

## 2023-07-21 DIAGNOSIS — C649 Malignant neoplasm of unspecified kidney, except renal pelvis: Secondary | ICD-10-CM

## 2023-07-21 DIAGNOSIS — M79601 Pain in right arm: Secondary | ICD-10-CM

## 2023-07-21 DIAGNOSIS — L608 Other nail disorders: Secondary | ICD-10-CM

## 2023-07-21 LAB — POCT GLYCOSYLATED HEMOGLOBIN (HGB A1C): Hemoglobin A1C: 6.2 % — AB (ref 4.0–5.6)

## 2023-07-21 MED ORDER — AMLODIPINE BESYLATE 10 MG PO TABS: 10.0000 mg | ORAL_TABLET | Freq: Every day | ORAL | 0 refills | Status: DC

## 2023-07-21 MED ORDER — CELECOXIB 100 MG PO CAPS: 100.0000 mg | ORAL_CAPSULE | Freq: Two times a day (BID) | ORAL | 0 refills | Status: DC | PRN

## 2023-07-21 MED ORDER — PANTOPRAZOLE SODIUM 40 MG PO TBEC: 40.0000 mg | DELAYED_RELEASE_TABLET | Freq: Every day | ORAL | 0 refills | Status: DC

## 2023-07-21 MED ORDER — EZETIMIBE 10 MG PO TABS: 10.0000 mg | ORAL_TABLET | Freq: Every day | ORAL | 3 refills | Status: DC

## 2023-07-21 MED ORDER — VITAMIN D3 1.25 MG (50000 UT) PO CAPS: 50000.0000 [IU] | ORAL_CAPSULE | ORAL | 5 refills | Status: DC

## 2023-07-21 MED ORDER — LISINOPRIL 5 MG PO TABS: 5.0000 mg | ORAL_TABLET | Freq: Every day | ORAL | 3 refills | Status: DC

## 2023-07-21 MED ORDER — TERBINAFINE HCL 250 MG PO TABS: 250.0000 mg | ORAL_TABLET | Freq: Every day | ORAL | 3 refills | Status: AC

## 2023-07-21 NOTE — Patient Instructions (Signed)
 VISIT SUMMARY:  Matthew Francis, a 63 year old male, visited for follow-up on multiple health issues including diabetes, hypertension, hyperlipidemia, GERD, chronic pain, and nail fungus. His diabetes is well-controlled with a recent A1c of 6.2. Blood pressure is stable with current medications. GERD is managed with Protonix , and he experiences occasional arm pain likely due to MRI positioning. 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 He also has nail fungus unresponsive to current treatment and a history of ulcerative colitis.  YOUR PLAN:  -CHRONIC PAIN: You have arm pain likely due to forced positioning during an MRI. We discussed using celecoxib , which is easier on the stomach, along with famotidine  to protect your stomach. Please take celecoxib  100 mg twice daily as needed and follow up in one week to assess your pain.  -HYPERTENSION: Your blood pressure is well-controlled with your current medications. We have refilled your prescriptions for amlodipine  10 mg and lisinopril  5 mg. Continue taking these medications as directed.  -TYPE 2 DIABETES MELLITUS: Your diabetes is well-managed with a recent A1c of 6.2. Continue managing your diabetes with your current diet and medications.  -HYPERLIPIDEMIA: Hyperlipidemia means you have high cholesterol levels. We have refilled your prescription for Zetia  10 mg. Continue taking this medication as directed.  -GASTROESOPHAGEAL REFLUX DISEASE (GERD): GERD is a condition where stomach acid frequently flows back into the tube connecting your mouth and stomach. Your GERD is managed with Protonix  40 mg daily, and we have refilled your prescription. Use famotidine  as needed for any breakthrough symptoms.  -ULCERATIVE COLITIS: Ulcerative colitis is a chronic condition causing inflammation in your digestive tract. You are managing symptoms with Lomotil, Bentyl , and occasional antibiotics. We have refilled your prescriptions  for these medications and your vitamin D  supplement.  -ONYCHOMYCOSIS: Onychomycosis is a fungal infection of the nails. Your current topical treatment has not been effective, so we are prescribing terbinafine  250 mg daily for 12 weeks. Be aware of potential liver side effects, and we will monitor your liver function. Follow up in 4 months to assess the treatment's effectiveness.  -PAPILLARY RENAL CELL CARCINOMA: Papillary renal cell carcinoma is a type of kidney cancer. Your recent MRI showed no evidence of new growth. Continue follow-up with your urologist, Dr. Marolyn Coombs.  -GENERAL HEALTH MAINTENANCE: We will continue to monitor your blood pressure, A1c, and cholesterol levels regularly. Your recent CBC and liver function tests were stable.  INSTRUCTIONS:  Please follow up in 1 week to assess your arm pain and in 4 months to evaluate the effectiveness of your onychomycosis treatment.

## 2023-07-21 NOTE — Progress Notes (Signed)
 Assessment/Plan:      Right Arm Pain Pain in the arm possibly due to forced positioning during MRI. Using turmeric and Voltaren  gel with some relief. NSAIDs are limited due to colitis. Discussed celecoxib  as an alternative with better stomach tolerance. Advised to take with famotidine  to protect the stomach. - Prescribe celecoxib  100 mg twice daily as needed - Take celecoxib  with famotidine  - Follow-up in 1 week regarding arm pain  Hypertension Blood pressure is well-controlled. Requires refills on amlodipine  10 mg and lisinopril  5 mg. No chest pain or shortness of breath reported. Previously discontinued clonidine  due to side effects. - Refill amlodipine  10 mg - Refill lisinopril  5 mg  Type 2 Diabetes Mellitus A1c is 6.2, down from 6.4. Managing diabetes with diet. No new medications added. - Continue current diet management  Hyperlipidemia Requires refill of Zetia  10 mg. - Refill Zetia  10 mg  Gastroesophageal Reflux Disease (GERD) On Protonix  40 mg daily with occasional breakthrough symptoms managed with famotidine  20 mg as needed. - Refill Protonix  40 mg  Ulcerative Colitis Manages symptoms with Lomotil, Bentyl , and occasional use of Xifaxan  or cefdinir for bacterial infections. Typcally avoids NSAIDs due to colitis.  Vitamin D  deficiency  Secondary due to malabsorption from GI surgeries and ulcerative colitis.  Managed with 50,000 IU weekly.  - Refill Vitamin D  50,000 IU weekly  Onychomycosis Nail fungus on bilateral feet, worse on the right foot, unresponsive to topical ciclopirox 8%. Liver function is stable. Discussed terbinafine  as an alternative treatment with a 70% efficacy rate. Informed about potential liver side effects and need for monitoring. - Prescribe terbinafine  250 mg daily for 12 weeks - Follow-up in 4 months to assess treatment efficacy  Papillary Renal Cell Carcinoma Recent MRI showed no evidence of papillary growth. Follow-up with urologist Dr. Marolyn Coombs. Patient expressed gratitude for the outcome. - Continue follow-up with urologist  General Health Maintenance Regular monitoring of blood pressure, diabetes, and cholesterol levels. Recent CBC and liver function tests were stable. - Monitor blood pressure, A1c, and cholesterol levels regularly        Medications Discontinued During This Encounter  Medication Reason   cloNIDine  (CATAPRES ) 0.1 MG tablet    Cholecalciferol (VITAMIN D3) 1.25 MG (50000 UT) CAPS Reorder   lisinopril  (ZESTRIL ) 5 MG tablet Reorder   amLODipine  (NORVASC ) 10 MG tablet Reorder   ezetimibe  (ZETIA ) 10 MG tablet Reorder   pantoprazole  (PROTONIX ) 40 MG tablet Reorder    Return in about 4 months (around 11/18/2023) for nail fungus.    Subjective:   Encounter date: 07/21/2023  Matthew Francis is a 63 y.o. male who has Essential hypertension; Ulcerative colitis (HCC); Sleep disorder; Hyperlipidemia; History of hemorrhagic cerebrovascular accident (CVA) with residual deficit; Muscle spasm; Gastroesophageal reflux disease; Acute right hip pain; Controlled type 2 diabetes mellitus with complication, without long-term current use of insulin  (HCC); Cocaine abuse (HCC); Alcohol abuse; Recurrent major depressive disorder, in remission (HCC); Anxiety; Vitamin D  deficiency; B12 deficiency; Anemia; Lateral epicondylitis of left elbow; Ulnar neuropathy at elbow of right upper extremity; Vitreomacular adhesion of both eyes; Diabetes mellitus without complication (HCC); Nuclear sclerotic cataract of both eyes; Posterior subcapsular age-related cataract, both eyes; Renal lesion; Renal cell carcinoma (HCC); Onychomadesis of toenail; and Right arm pain on their problem list..   He  has a past medical history of Arthritis, 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 (02/09/2012), GERD (gastroesophageal reflux disease), H/O ulcerative colitis, Hyperlipidemia, Hypertension, ICH (intracerebral hemorrhage) (HCC) (12/11/2020),  Inguinal hernia,  Nontraumatic subcortical hemorrhage of brain (HCC), Severe sepsis (HCC) (01/17/2020), Thrombocytopenia (HCC) (01/17/2020), Ulcer, Ulcerative colitis, UTI due to extended-spectrum beta lactamase (ESBL) producing Escherichia coli (01/19/2020), and Wears glasses.SABRA   He presents with chief complaint of Medical Management of Chronic Issues (f/u b/p ,dm. Right arm concerns.) .   Discussed the use of AI scribe software for clinical note transcription with the patient, who gave verbal consent to proceed.  History of Present Illness   Matthew Francis is a 63 year old male who presents for follow-up on diabetes, hypertension, hyperlipidemia, and GERD.  He manages his diabetes with diet and medication, including three types of insulin  and a pill. His recent A1c was 6.2, down from 6.4, indicating good control. He takes vitamin D  50,000 IU weekly due to deficiency, likely related to gastrointestinal issues. Recent CBC and CRP were stable.  His blood pressure is well-controlled with amlodipine  10 mg, lisinopril  5 mg, and hydralazine  100 mg three times a day. A previous issue with a prescriber not renewing lisinopril  has been resolved. No chest pain or shortness of breath.  He continues cholesterol management with Zetia . No recent lipid panel results were discussed.  GERD is well-managed with Protonix  40 mg daily, with occasional breakthrough symptoms managed with famotidine  as needed.  He has a history of papillary renal cell carcinoma, recently had an MRI showing no evidence of papillary growth. He underwent cryoablation with positive results and is trying to return to normal life after a challenging year.  He experiences arm pain, particularly when rotating it, attributed to MRI positioning. The pain radiates from the bicep and tendon area. He takes turmeric for inflammation, providing some relief, and uses Voltaren  gel for pain.  He has nail fungus on both feet, worse on  right big toe, treated with a topical ciclopirox 8% for two months without improvement, seeking alternative treatments.  He has a history of ulcerative colitis, taking Xifaxan  and cefdinir as needed for bacterial infections, and uses Lomotil and Bentyl  four times a day for symptom management. Bowel movements are stable.  He works part-time at a department store due to financial constraints and is awaiting delayed disability payments despite approval.      Past Surgical History:  Procedure Laterality Date   APPENDECTOMY     COLECTOMY     Proctocolectomy with IPAA   COLECTOMY     FOREIGN BODY REMOVAL ABDOMINAL  02/17/2012   Procedure: REMOVAL FOREIGN BODY ABDOMINAL;  Surgeon: Krystal JINNY Russell, MD;  Location: [facility];  Service: General;  Laterality: N/A;  abdominal wound exploration and removal of foreign body   HERNIA REPAIR     Multiple incisional hernias.   ileoanal anastomosis      Outpatient Medications Prior to Visit  Medication Sig Dispense Refill   acetaminophen  (TYLENOL ) 325 MG tablet Take 2 tablets (650 mg total) by mouth every 6 (six) hours as needed for moderate pain or mild pain.     ascorbic acid  (VITAMIN C) 500 MG tablet Take 1 tablet (500 mg total) by mouth daily. 30 tablet 0   cefdinir (OMNICEF) 300 MG capsule Take 300 mg by mouth 2 (two) times daily.     cetirizine (ZYRTEC) 10 MG tablet Take 10 mg by mouth daily as needed for allergies.      ciclopirox (PENLAC) 8 % solution Apply topically at bedtime.     ciprofloxacin  (CIPRO ) 500 MG tablet Take 500 mg by mouth.     diclofenac  Sodium (  VOLTAREN ) 1 % GEL Apply 4 g topically 4 (four) times daily as needed. 100 g 3   dicyclomine  (BENTYL ) 10 MG capsule Take 10 mg by mouth in the morning, at noon, in the evening, and at bedtime.     diphenoxylate-atropine  (LOMOTIL) 2.5-0.025 MG tablet Take 1 tablet by mouth 4 (four) times daily.     famotidine  (PEPCID ) 40 MG tablet TAKE 1 TABLET(40 MG) BY MOUTH DAILY 90 tablet  0   Glucosamine 500 MG CAPS Take by mouth.     Krill Oil (OMEGA-3) 500 MG CAPS Take by mouth.     loperamide  (IMODIUM ) 2 MG capsule Take 2 capsules (4 mg total) by mouth as needed for diarrhea or loose stools. 30 capsule 0   melatonin 3 MG TABS tablet Take 1 tablet (3 mg total) by mouth at bedtime. 30 tablet 0   methocarbamol  (ROBAXIN ) 500 MG tablet Take 500 mg by mouth.     Multiple Vitamin (MULTIVITAMIN WITH MINERALS) TABS Take 1 tablet by mouth daily.     Omega-3 Fatty Acids (FISH OIL ) 1000 MG CAPS Take by mouth.     psyllium (METAMUCIL) 58.6 % packet Take 1 packet by mouth daily as needed (For fiber/thickener).      QUEtiapine  (SEROQUEL ) 25 MG tablet TAKE 1 TABLET(25 MG) BY MOUTH AT BEDTIME (Patient taking differently: Take 25 mg by mouth 2 (two) times daily.) 90 tablet 0   rifaximin  (XIFAXAN ) 550 MG TABS tablet Take 1 tablet (550 mg total) by mouth 2 (two) times daily. 42 tablet 4   Turmeric (QC TUMERIC COMPLEX PO) Take 1 tablet by mouth daily as needed (For inflammatory).     UNABLE TO FIND Med Name: IVERMETIN paste reported by patient OTC     vitamin B-12 (CYANOCOBALAMIN ) 100 MCG tablet Take 1 tablet (100 mcg total) by mouth daily. 30 tablet 0   amLODipine  (NORVASC ) 10 MG tablet TAKE 1 TABLET(10 MG) BY MOUTH DAILY 90 tablet 0   Cholecalciferol (VITAMIN D3) 1.25 MG (50000 UT) CAPS TAKE 1 CAPSULE BY MOUTH EVERY WEDNESDAY 4 capsule 5   ezetimibe  (ZETIA ) 10 MG tablet TAKE 1 TABLET(10 MG) BY MOUTH DAILY 30 tablet 2   lisinopril  (ZESTRIL ) 5 MG tablet Take 1 tablet (5 mg total) by mouth daily. 90 tablet 3   pantoprazole  (PROTONIX ) 40 MG tablet TAKE 1 TABLET(40 MG) BY MOUTH DAILY 90 tablet 0   hydrALAZINE  (APRESOLINE ) 100 MG tablet Take 1 tablet (100 mg total) by mouth 3 (three) times daily. (Patient taking differently: Take 100 mg by mouth 2 (two) times daily.) 270 tablet 3   rosuvastatin  (CRESTOR ) 5 MG tablet Take 1 tablet (5 mg total) by mouth daily. (Patient not taking: Reported on 07/21/2023)  30 tablet 5   cloNIDine  (CATAPRES ) 0.1 MG tablet TAKE 1 TABLET(0.1 MG) BY MOUTH THREE TIMES DAILY (Patient not taking: Reported on 07/21/2023) 90 tablet 11   No facility-administered medications prior to visit.    Family History  Problem Relation Age of Onset   Neurodegenerative disease Mother    Cancer Father        prostate cancer   Cancer Sister    Diabetes Paternal Grandmother    Stroke Paternal Grandfather     Social History   Socioeconomic History   Marital status: Single    Spouse name: Not on file   Number of children: Not on file   Years of education: Not on file   Highest education level: Not on file  Occupational History  Comment: restaurant   Occupation: [employer]  Tobacco Use   Smoking status: Never   Smokeless tobacco: Never  Vaping Use   Vaping status: Never Used  Substance and Sexual Activity   Alcohol use: Yes    Alcohol/week: 21.0 standard drinks of alcohol    Types: 21 Cans of beer per week   Drug use: Not Currently    Types: Cocaine   Sexual activity: Not Currently  Other Topics Concern   Not on file  Social History Narrative   Not on file   Social Drivers of Health   Financial Resource Strain: Low Risk  (01/07/2023)   Received from [facility]   Overall Financial Resource Strain (CARDIA)    Difficulty of Paying Living Expenses: Not very hard  Food Insecurity: Not on File (03/12/2023)   Received from [facility] Insecurity    Food: 0  Transportation Needs: No Transportation Needs (01/07/2023)   Received from [facility] - Transportation    Lack of Transportation (Medical): No    Lack of Transportation (Non-Medical): No  Physical Activity: Not on File (10/03/2022)   Received from [facility]   Physical Activity    Physical Activity: 0  Stress: Not on File (10/03/2022)   Received from [facility]   Stress    Stress: 0  Social Connections: Not on File (03/02/2023)   Received from [facility]   Social Connections    Connectedness: 0   Intimate Partner Violence: Not At Risk (11/28/2022)   Received from [facility]   Humiliation, Afraid, Rape, and Kick questionnaire    Fear of Current or Ex-Partner: No    Emotionally Abused: No    Physically Abused: No    Sexually Abused: No                                                                                                  Objective:  Physical Exam: BP 138/88   Pulse 74   Temp 97.7 F (36.5 C) (Temporal)   Ht 5' 11 (1.803 m)   Wt 212 lb 3.2 oz (96.3 kg)   SpO2 99%   BMI 29.60 kg/m     Physical Exam Constitutional:      Appearance: Normal appearance.  HENT:     Head: Normocephalic and atraumatic.     Right Ear: Hearing normal.     Left Ear: Hearing normal.     Nose: Nose normal.  Eyes:     General: No scleral icterus.       Right eye: No discharge.        Left eye: No discharge.     Extraocular Movements: Extraocular movements intact.  Cardiovascular:     Rate and Rhythm: Normal rate and regular rhythm.     Heart sounds: Normal heart sounds.  Pulmonary:     Effort: Pulmonary effort is normal.     Breath sounds: Normal breath sounds.  Abdominal:     Palpations: Abdomen is soft.     Tenderness: There is no abdominal tenderness.  Musculoskeletal:     Right upper arm:  Tenderness present.     Right lower leg: No edema.     Left lower leg: No edema.  Feet:     Right foot:     Toenail Condition: Right toenails are abnormally thick. Fungal disease present.    Left foot:     Toenail Condition: Left toenails are abnormally thick. Fungal disease present. Skin:    General: Skin is warm.     Findings: No rash.  Neurological:     General: No focal deficit present.     Mental Status: He is alert.     Cranial Nerves: No cranial nerve deficit.  Psychiatric:        Mood and Affect: Mood normal.        Behavior: Behavior normal.        Thought Content: Thought content normal.        Judgment: Judgment normal.     No results found.  Recent  Results (from the past 2160 hours)  POCT glycosylated hemoglobin (Hb A1C)     Status: Abnormal   Collection Time: 07/21/23  1:40 PM  Result Value Ref Range   Hemoglobin A1C 6.2 (A) 4.0 - 5.6 %   HbA1c POC (<> result, manual entry)     HbA1c, POC (prediabetic range)     HbA1c, POC (controlled diabetic range)          Beverley Adine Hummer, MD, MS

## 2023-07-22 NOTE — Unmapped (Signed)
[facility] Refill Coordination Note    Specialty Medication(s) to be Shipped:   Inflammatory Disorders: Skyrizi    Other medication(s) to be shipped: No additional medications requested for fill at this time     Tony Andrade, DOB: May 22, 1961  Phone: [phone] (home)       All above HIPAA information was verified with patient.     Was a Nurse, learning disability used for this call? No    Completed refill call assessment today to schedule patient's medication shipment from the [facility]  [phone]).  All relevant notes have been reviewed.     Specialty medication(s) and dose(s) confirmed: Regimen is correct and unchanged.   Changes to medications: Tony Andrade reports starting the following medications: celebrex, terbinafine   Changes to insurance: No  New side effects reported not previously addressed with a pharmacist or physician: None reported  Questions for the pharmacist: No    Confirmed patient received a Conservation officer, historic buildings and a Surveyor, mining with first shipment. The patient will receive a drug information handout for each medication shipped and additional FDA Medication Guides as required.       DISEASE/MEDICATION-SPECIFIC INFORMATION        For patients on injectable medications: Patient currently has 0 doses left.  Next injection is scheduled for 2/14.    SPECIALTY MEDICATION ADHERENCE     Medication Adherence    Specialty Medication: SKYRIZI 360 mg/2.4 mL  Patient is on additional specialty medications: No              Were doses missed due to medication being on hold? No    Skyrizi 360  mg/2.98ml : 0 doses of medicine on hand     REFERRAL TO PHARMACIST     Referral to the pharmacist: Not needed      [facility]     Shipping address confirmed in Epic.       Delivery Scheduled: Yes, Expected medication delivery date: 2/11.     Medication will be delivered via UPS to the prescription address in Epic WAM.    Tony Andrade   [facility]  Specialty Pharmacist

## 2023-07-27 ENCOUNTER — Telehealth: Payer: Self-pay | Admitting: *Deleted

## 2023-07-27 DIAGNOSIS — K91850 Pouchitis: Principal | ICD-10-CM

## 2023-07-27 DIAGNOSIS — J019 Acute sinusitis, unspecified: Principal | ICD-10-CM

## 2023-07-27 MED ORDER — CEFDINIR 300 MG CAPSULE
ORAL_CAPSULE | Freq: Two times a day (BID) | ORAL | 3 refills | 90.00 days | Status: CP
Start: 2023-07-27 — End: 2024-07-26

## 2023-07-27 NOTE — Progress Notes (Signed)
 07/27/2023 Name: Matthew Francis MRN: [id number] DOB: [birth year]  Referred by: Catheryn Cluck, MD Reason for referral : High Risk Managed Medicaid (Initial outreach to schedule referral with SW)    A successful telephone outreach was attempted today. The patient was referred to the case management team for assistance with care management and care coordination.   Follow Up Plan: The Managed Medicaid care management team will reach out to the patient by phone on 08/06/23   [facility]  [facility], [facility] Guide  Direct Dial: [phone]  Fax [phone]

## 2023-08-06 ENCOUNTER — Other Ambulatory Visit: Payer: Self-pay

## 2023-08-06 NOTE — Patient Outreach (Signed)
 Medicaid Managed Care Social Work Note  08/06/2023 Name:  Matthew Francis MRN:  [id number] DOB:  19-Jul-1960  Matthew Francis is an 63 y.o. year old male who is a primary patient of [name], MD.  The Medicaid Managed Care Coordination team was consulted for assistance with:   disability   Mr. Schunk was given information about Medicaid Managed Care Coordination team services today. Matthew Francis Patient agreed to services and verbal consent obtained.  Engaged with patient  for by telephone forinitial visit in response to referral for case management and/or care coordination services.   Patient is participating in a Managed Medicaid Plan:  Yes  Assessments/Interventions:  Review of past medical history, allergies, medications, health status, including review of consultants reports, laboratory and other test data, was performed as part of comprehensive evaluation and provision of chronic care management services.  SDOH: (Social Drivers of Health) assessments and interventions performed: SDOH Interventions    Flowsheet Row Office Visit from [facility]/13/2022 in [facility] Visit from 01/03/2022 in [facility]  SDOH Interventions    Depression Interventions/Treatment  Medication Counseling     BSW completed a telephone outreach with patient, he states he did hear from disability over the weekend and he should be receiving a letter from them soon. He was approved for disability in August but had not received any income yet. Patient states he does receive foodstamps and is living in a camper on a friends property. Patient states no resources are needed right now. He will undated BSW once he receives his disability letter.   Advanced Directives Status:  Not addressed in this encounter.  Care Plan                 Allergies  Allergen Reactions   Asacol [Mesalamine] Swelling    Throat    Penicillins Anaphylaxis     Has patient had a PCN reaction causing immediate rash, facial/tongue/throat swelling, SOB or lightheadedness with hypotension: Yes Has patient had a PCN reaction causing severe rash involving mucus membranes or skin necrosis: No Has patient had a PCN reaction that required hospitalization: Yes Has patient had a PCN reaction occurring within the last 10 years: No If all of the above answers are "NO", then may proceed with Cephalosporin use.   Sulfa Antibiotics Anaphylaxis   Atorvastatin Other (See Comments)    Myalgia, muscle cramps   Bee Pollen Hives   Bee Venom Hives and Swelling   Iodinated Contrast Media     Pt reports allergy at [place]. 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   Diabetes mellitus without complication (HCC) 03/10/2022   Nuclear sclerotic cataract of both eyes 03/10/2022   Posterior subcapsular age-related cataract, both eyes 03/10/2022   Ulnar neuropathy at elbow of right upper extremity 01/29/2022   Anemia 01/03/2022   Lateral epicondylitis of left elbow 01/03/2022   Hyperlipidemia 11/12/2021   History of hemorrhagic cerebrovascular accident (CVA) with residual deficit 11/12/2021   Muscle spasm 11/12/2021   Gastroesophageal reflux disease 11/12/2021   Acute right hip pain 11/12/2021   Controlled type 2 diabetes  mellitus with complication, without long-term current use of insulin (HCC) 11/12/2021   Cocaine abuse (HCC) 11/12/2021   Alcohol abuse 11/12/2021   Recurrent major depressive disorder, in remission (HCC) 11/12/2021   Anxiety 11/12/2021   Vitamin D deficiency 11/12/2021   B12 deficiency 11/12/2021   Sleep disorder 04/03/2021   Essential  hypertension 01/17/2020   Ulcerative colitis (HCC) 01/17/2020    Conditions to be addressed/monitored per PCP order:   disability   There are no care plans that you recently modified to display for this patient.   Follow up:  Patient agrees to Care Plan and Follow-up.  Plan: The Managed Medicaid care management team will reach out to the patient again over the next 30 days.  Date/time of next scheduled Social Work care management/care coordination outreach:  09/03/23  Gus Puma, Kenard Gower, [facility]  Managed [facility] Social Worker [phone]

## 2023-08-06 NOTE — Patient Outreach (Signed)
  Medicaid Managed Care   Unsuccessful Outreach Note  08/06/2023 Name: Matthew Francis MRN: [id number] DOB: 06-16-1961  Referred by: [name], MD Reason for referral : High Risk Managed Medicaid (MM social work unsuccessful telephone outreach )   An unsuccessful telephone outreach was attempted today. The patient was referred to the case management team for assistance with care management and care coordination.   Follow Up Plan: A HIPAA compliant phone message was left for the patient providing contact information and requesting a return call.   Abelino Derrick, MHA [facility]  Managed [facility] Social Worker [phone]

## 2023-08-06 NOTE — Patient Instructions (Addendum)
  Medicaid Managed Care   Unsuccessful Outreach Note  08/06/2023 Name: Matthew Francis MRN: [id number] DOB: 18-Aug-1960  Referred by: [name], MD Reason for referral : High Risk Managed Medicaid (MM social work unsuccessful telephone outreach )   An unsuccessful telephone outreach was attempted today. The patient was referred to the case management team for assistance with care management and care coordination.   Follow Up Plan: A HIPAA compliant phone message was left for the patient providing contact information and requesting a return call.   Gus Puma, Kenard Gower, MHA [facility]  Managed Medicaid Social Worker [phone] Visit Information  The Patient                                              was given information about Medicaid Managed Care team care coordination services and consented to engagement with the [facility] Managed Care team.   Social Worker will follow up in 30 days.   Abelino Derrick, MHA [facility]  Managed [facility] Social Worker [phone]

## 2023-08-14 MED ORDER — DICYCLOMINE 10 MG CAPSULE
ORAL_CAPSULE | Freq: Four times a day (QID) | ORAL | 3 refills | 90.00 days | Status: CP | PRN
Start: 2023-08-14 — End: 2024-08-13

## 2023-08-16 ENCOUNTER — Other Ambulatory Visit: Payer: Self-pay | Admitting: Family Medicine

## 2023-08-16 ENCOUNTER — Other Ambulatory Visit: Payer: Self-pay | Admitting: Physical Medicine & Rehabilitation

## 2023-08-16 DIAGNOSIS — G9341 Metabolic encephalopathy: Secondary | ICD-10-CM

## 2023-08-16 DIAGNOSIS — M79601 Pain in right arm: Secondary | ICD-10-CM

## 2023-08-16 DIAGNOSIS — I10 Essential (primary) hypertension: Secondary | ICD-10-CM

## 2023-08-17 ENCOUNTER — Other Ambulatory Visit: Payer: Self-pay | Admitting: Family Medicine

## 2023-08-17 DIAGNOSIS — I10 Essential (primary) hypertension: Secondary | ICD-10-CM

## 2023-08-17 DIAGNOSIS — G9341 Metabolic encephalopathy: Secondary | ICD-10-CM

## 2023-08-17 MED ORDER — HYDRALAZINE HCL 100 MG PO TABS: 100.0000 mg | ORAL_TABLET | Freq: Three times a day (TID) | ORAL | 3 refills | Status: AC

## 2023-08-17 NOTE — Telephone Encounter (Signed)
 Copied from CRM [id number]. Topic: Clinical - Medication Refill >> Aug 17, 2023  1:42 PM Kathryne Eriksson wrote: Most Recent Primary Care Visit:  Provider: [name]  Department: [facility]  Visit Type: OFFICE VISIT  Date: 07/21/2023  Medication: hydrALAZINE (APRESOLINE) 100 MG tablet  Has the patient contacted their pharmacy? Yes (Agent: If no, request that the patient contact the pharmacy for the refill. If patient does not wish to contact the pharmacy document the reason why and proceed with request.) (Agent: If yes, when and what did the pharmacy advise?)  Is this the correct pharmacy for this prescription? Yes If no, delete pharmacy and type the correct one.  This is the patient's preferred pharmacy:  [facility] [phone] - [place], [place] - [address] AT [facility] [address] [place] [address] Phone: [phone] Fax: [phone]   Has the prescription been filled recently? No  Is the patient out of the medication? Yes  Has the patient been seen for an appointment in the last year OR does the patient have an upcoming appointment? Yes  Can we respond through MyChart? Yes  Agent: Please be advised that Rx refills may take up to 3 business days. We ask that you follow-up with your pharmacy.

## 2023-08-17 NOTE — Telephone Encounter (Signed)
 Requesting: hydrALAZINE (APRESOLINE) 100 MG tablet  Last Visit: 07/21/2023 Next Visit: 11/19/2023 Last Refill: 03/17/2022  Please Advise

## 2023-08-22 ENCOUNTER — Other Ambulatory Visit: Payer: Self-pay | Admitting: Physical Medicine & Rehabilitation

## 2023-08-22 DIAGNOSIS — G9341 Metabolic encephalopathy: Secondary | ICD-10-CM

## 2023-09-03 ENCOUNTER — Other Ambulatory Visit: Payer: Self-pay

## 2023-09-03 NOTE — Patient Instructions (Signed)
 Visit Information  Matthew Francis was given information about Medicaid Managed Care team care coordination services as a part of their [employer] Medicaid benefit. Matthew Francis verbally consentedto engagement with the [facility] Managed Care team.   If you are experiencing a medical emergency, please call 911 or report to your local emergency department or urgent care.   If you have a non-emergency medical problem during routine business hours, please contact your provider's office and ask to speak with a nurse.   For questions related to your [employer] [facility] health plan, please call: [phone]  OR visit the member homepage at: [email or web address]  If you would like to schedule transportation through your [facility] plan, please call the following number at least 2 days in advance of your appointment: [phone]  If you are experiencing a behavioral health crisis, call the [facility] Crisis Line at [phone]). The line is available 24 hours a day, seven days a week.  If you would like help to quit smoking, call 1-800-QUIT-NOW ((947)804-4437) OR Espaol: 1-855-Djelo-Ya (6-387-564-3329) o para ms informacin haga clic aqu or Text READY to 518-841 to register via text   Matthew Francis - following are the goals we discussed in your visit today:   Goals Addressed   None     Social Worker will follow up in 30 days.   Matthew Francis, Matthew Francis, MHA [facility]  Managed Medicaid Social Worker [phone]   Following is a copy of your plan of care:  There are no care plans that you recently modified to display for this patient.

## 2023-09-03 NOTE — Patient Outreach (Signed)
 Medicaid Managed Care Social Work Note  09/03/2023 Name:  Matthew Francis MRN:  [id number] DOB:  September 20, 1960  Matthew Francis is an 63 y.o. year old male who is a primary patient of Matthew Gunner, MD.  The Medicaid Managed Care Coordination team was consulted for assistance with:   Disability  Matthew Francis was given information about Medicaid Managed Care Coordination team services today. Matthew Francis Patient agreed to services and verbal consent obtained.  Engaged with patient  for by telephone forfollow up visit in response to referral for case management and/or care coordination services.   Patient is participating in a Managed Medicaid Plan:  Yes  Assessments/Interventions:  Review of past medical history, allergies, medications, health status, including review of consultants reports, laboratory and other test data, was performed as part of comprehensive evaluation and provision of chronic care management services.  SDOH: (Social Drivers of Health) assessments and interventions performed: SDOH Interventions    Flowsheet Row Office Visit [facility]20[facility] Visit from [facility]03/2022 in [facility]  SDOH Interventions    Depression Interventions/Treatment  Medication Counseling      BSW completed a telephone outreach with patient, he states he still has not received any income yet. It has been 8 months since he was approved. Patient states he is still working but may quit soon. He just received his foodstamps on 3/13. No resources are needed at this time.  Advanced Directives Status:  Not addressed in this encounter.  Care Plan                 Allergies  Allergen Reactions   Asacol [Mesalamine] Swelling    Throat    Penicillins Anaphylaxis    Has patient had a PCN reaction causing immediate rash, facial/tongue/throat swelling, SOB or lightheadedness with hypotension: Yes Has patient had a  PCN reaction causing severe rash involving mucus membranes or skin necrosis: No Has patient had a PCN reaction that required hospitalization: Yes Has patient had a PCN reaction occurring within the last 10 years: No If all of the above answers are "NO", then may proceed with Cephalosporin use.   Sulfa Antibiotics Anaphylaxis   Atorvastatin Other (See Comments)    Myalgia, muscle cramps   Bee Pollen Hives   Bee Venom Hives and Swelling   Iodinated Contrast Media     Pt reports allergy at [place]. 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   Diabetes mellitus without complication (HCC) 03/10/2022   Nuclear sclerotic cataract of both eyes 03/10/2022   Posterior subcapsular age-related cataract, both eyes 03/10/2022   Ulnar neuropathy at elbow of right upper extremity 01/29/2022   Anemia 01/03/2022   Lateral epicondylitis of left elbow 01/03/2022   Hyperlipidemia 11/12/2021   History of hemorrhagic cerebrovascular accident (CVA) with residual deficit 11/12/2021   Muscle spasm 11/12/2021   Gastroesophageal reflux disease 11/12/2021   Acute right hip pain 11/12/2021   Controlled type 2 diabetes mellitus with complication, without long-term current use of insulin (HCC) 11/12/2021   Cocaine abuse (HCC) 11/12/2021   Alcohol abuse 11/12/2021   Recurrent  major depressive disorder, in remission (HCC) 11/12/2021   Anxiety 11/12/2021   Vitamin D deficiency 11/12/2021   B12 deficiency 11/12/2021   Sleep disorder 04/03/2021   Essential hypertension 01/17/2020   Ulcerative colitis (HCC) 01/17/2020    Conditions to be addressed/monitored per PCP order:   disability income  There are no  care plans that you recently modified to display for this patient.   Follow up:  Patient agrees to Care Plan and Follow-up.  Plan: The Managed Medicaid care management team will reach out to the patient again over the next 30 days.  Date/time of next scheduled Social Work care management/care coordination outreach:  10/05/23  Gus Puma, Kenard Gower, [facility]  Managed [facility] Social Worker [phone]

## 2023-09-13 ENCOUNTER — Other Ambulatory Visit: Payer: Self-pay | Admitting: Family Medicine

## 2023-09-13 DIAGNOSIS — K219 Gastro-esophageal reflux disease without esophagitis: Secondary | ICD-10-CM

## 2023-09-13 DIAGNOSIS — M79601 Pain in right arm: Secondary | ICD-10-CM

## 2023-09-21 ENCOUNTER — Telehealth: Payer: Self-pay

## 2023-09-21 NOTE — Telephone Encounter (Signed)
 Copied from CRM [id number]. Topic: Clinical - Medication Question >> Sep 21, 2023  2:56 PM Aisha D wrote: Reason for CRM: Patient is calling to give an update on his medication for celecoxib (CELEBREX) 100 MG capsule. Patient stated that the medication is helping a lot but he can only take 1 a day because the medication gives him stomach pains every other day. Patient stated that he is supposed to take the medication twice a day but when he does, he has stomach pains everyday.

## 2023-09-22 NOTE — Telephone Encounter (Signed)
 Patient is aware of annotation below and verbalized understanding. Scheduled with PCP on 09/24/2023 at 3pm

## 2023-09-22 NOTE — Unmapped (Addendum)
 [facility] Refill Coordination Note    Specialty Medication(s) to be Shipped:   Inflammatory Disorders: Skyrizi    Other medication(s) to be shipped: No additional medications requested for fill at this time     Tony Andrade, DOB: 1961/05/16  Phone: [phone] (home)       All above HIPAA information was verified with patient.     Was a Nurse, learning disability used for this call? No    Completed refill call assessment today to schedule patient's medication shipment from the [facility]  [phone]).  All relevant notes have been reviewed.     Specialty medication(s) and dose(s) confirmed: Regimen is correct and unchanged.   Changes to medications:  Started Cipro, Stopped taking Xifaxan.  Changes to insurance: No  New side effects reported not previously addressed with a pharmacist or physician: None reported  Questions for the pharmacist: No    Confirmed patient received a Conservation officer, historic buildings and a Surveyor, mining with first shipment. The patient will receive a drug information handout for each medication shipped and additional FDA Medication Guides as required.       DISEASE/MEDICATION-SPECIFIC INFORMATION        For patients on injectable medications: Patient currently has 0 doses left.  Next injection is scheduled for next week.    SPECIALTY MEDICATION ADHERENCE     Medication Adherence    Patient reported X missed doses in the last month: 0  Specialty Medication: SKYRIZI 360 mg/2.4 mL  Patient is on additional specialty medications: No              Were doses missed due to medication being on hold? No    Skyrizi 360/2.4 mg/ml: 0 doses of medicine on hand        REFERRAL TO PHARMACIST     Referral to the pharmacist: Not needed      [facility]     Shipping address confirmed in Epic.     Cost and Payment: Patient has a $0 copay, payment information is not required.    Delivery Scheduled: Yes, Expected medication delivery date: 09/29/23.     Medication will be delivered via UPS to the prescription address in Epic WAM.    Canary Ceo   [facility]  Specialty Technician

## 2023-09-24 ENCOUNTER — Encounter: Payer: Self-pay | Admitting: Family Medicine

## 2023-09-24 ENCOUNTER — Ambulatory Visit (INDEPENDENT_AMBULATORY_CARE_PROVIDER_SITE_OTHER): Admitting: Family Medicine

## 2023-09-24 VITALS — BP 134/84 | HR 70 | Temp 97.2°F | Ht 71.0 in | Wt 210.2 lb

## 2023-09-24 DIAGNOSIS — G8929 Other chronic pain: Secondary | ICD-10-CM | POA: Diagnosis not present

## 2023-09-24 DIAGNOSIS — F332 Major depressive disorder, recurrent severe without psychotic features: Secondary | ICD-10-CM

## 2023-09-24 DIAGNOSIS — M2550 Pain in unspecified joint: Secondary | ICD-10-CM

## 2023-09-24 MED ORDER — CELECOXIB 50 MG PO CAPS: 50.0000 mg | ORAL_CAPSULE | Freq: Two times a day (BID) | ORAL | 0 refills | Status: DC | PRN

## 2023-09-24 NOTE — Patient Instructions (Signed)
 VISIT SUMMARY:  You came in today to discuss your joint pain and high depression scores. We talked about how your current medications are working and the challenges you're facing in your personal and professional life.  YOUR PLAN:  -JOINT PAIN: You have chronic joint pain in your elbows and shoulders. Celebrex has been effective in reducing your pain, but it caused stomach issues when taken twice daily. We have adjusted your dosage to 50 mg twice daily and will monitor for any gastrointestinal side effects. Joint pain can be due to various reasons, including wear and tear or inflammation in the joints.  -DEPRESSION: You are experiencing high levels of depression, feeling overwhelmed, and have had fleeting thoughts of self-harm without a plan. This is likely due to multiple stressors in your life, including job dissatisfaction, family issues, and living conditions. We emphasized the importance of therapy and will help you connect with a new therapist to provide mental health support.  INSTRUCTIONS:  Please take Celebrex 50 mg twice daily and monitor for any stomach issues. We will also facilitate a referral to a new therapist to help you with your depression. Follow up with [place] if you experience any new symptoms or if your condition does not improve.

## 2023-09-24 NOTE — Progress Notes (Signed)
 Assessment/Plan:    Assessment & Plan Joint Pain Chronic joint pain primarily affecting the elbows and shoulders. Celebrex has been effective in reducing joint pain but causes gastrointestinal discomfort when taken twice daily. Celebrex is chosen due to its COX-2 inhibitor properties, which are associated with fewer gastrointestinal side effects compared to other NSAIDs. However, he still experiences some discomfort, indicating a need to adjust the dosage. Celebrex is the only COX-2 inhibitor available after others were removed from the market due to concerns about cardiovascular risks, although it does not increase heart attack risk more than ibuprofen. - Prescribe Celebrex 50 mg twice daily - Monitor for gastrointestinal side effects  Depression Reports high depression scores and significant life stressors, including job dissatisfaction, family issues, and living conditions. He expresses feelings of being overwhelmed and has had fleeting thoughts of self-harm without a plan. He has been unable to connect with the current therapist due to technical issues. Emphasized the importance of therapy and offered to facilitate a referral to a new therapist. - Refer to therapy for mental health support - Facilitate connection with a new therapist      There are no discontinued medications.  Return if symptoms worsen or fail to improve.    Subjective:   Encounter date: 09/24/2023  Matthew Francis is a 63 y.o. male who has Essential hypertension; Ulcerative colitis (HCC); Sleep disorder; Hyperlipidemia; History of hemorrhagic cerebrovascular accident (CVA) with residual deficit; Muscle spasm; Gastroesophageal reflux disease; Acute right hip pain; Controlled type 2 diabetes mellitus with complication, without long-term current use of insulin (HCC); Cocaine abuse (HCC); Alcohol abuse; Recurrent major depressive disorder, in remission (HCC); Anxiety; Vitamin D deficiency; B12 deficiency; Anemia;  Lateral epicondylitis of left elbow; Ulnar neuropathy at elbow of right upper extremity; Vitreomacular adhesion of both eyes; Diabetes mellitus without complication (HCC); Nuclear sclerotic cataract of both eyes; Posterior subcapsular age-related cataract, both eyes; Renal lesion; Renal cell carcinoma (HCC); Onychomadesis of toenail; and Right arm pain on their problem list..   He  has a past medical history of Arthritis, 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 (02/09/2012), GERD (gastroesophageal reflux disease), H/O ulcerative colitis, Hyperlipidemia, Hypertension, ICH (intracerebral hemorrhage) (HCC) (12/11/2020), Inguinal hernia, Nontraumatic subcortical hemorrhage of brain (HCC), Severe sepsis (HCC) (01/17/2020), Thrombocytopenia (HCC) (01/17/2020), Ulcer, Ulcerative colitis, UTI due to extended-spectrum beta lactamase (ESBL) producing Escherichia coli (01/19/2020), and Wears glasses.Matthew Francis   He presents with chief complaint of Medical Management of Chronic Issues (Celecoxib alternatives. Patient has been feeling fatigue. ) .   Discussed the use of AI scribe software for clinical note transcription with the patient, who gave verbal consent to proceed.  History of Present Illness Matthew Francis "Pronounced DIE LAN" is a 63 year old male who presents with joint pain and high depression scores.  He is experiencing joint pain, primarily in his elbows and shoulders. Celebrex has significantly reduced the pain, with his elbows no longer causing severe discomfort and his shoulders feeling much better. However, taking Celebrex twice a day with famotidine caused stomach pain, so he adjusted to taking it once a day at night, which has minimized the stomach issues.  He reports high depression scores and describes feeling like he is 'just going through the motions.' He has thoughts of self-harm but no active plan. He feels overwhelmed by his circumstances and does not enjoy getting out of bed. He is also  dealing with the stress of a friend's severe accident and hospitalization, which adds to his emotional burden.  He has been unable to connect with his therapist, Ranee Button, due to technical issues with the onboarding process, which has been ongoing for four months. He expresses frustration with the situation and the lack of support from the therapy provider.  He has a part-time job at [employer], which he finds stressful due to low pay and workplace dynamics. He feels undervalued and experiences workplace gossip and favoritism, which he finds unnecessary and stressful. 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 He is living on a friend's property in a camper after being kicked out of his brother's house following his father's death. He describes his brother as 'greedy' and 'evil,' and he has not spoken to him in almost a year. He is also dealing with stress from living with a friend who he describes as a 'megalomaniac.'       Past Surgical History:  Procedure Laterality Date   APPENDECTOMY     COLECTOMY     Proctocolectomy with IPAA   COLECTOMY     FOREIGN BODY REMOVAL ABDOMINAL  02/17/2012   Procedure: REMOVAL FOREIGN BODY ABDOMINAL;  Surgeon: Harlee Lichtenstein, MD;  Location: [facility];  Service: General;  Laterality: N/A;  abdominal wound exploration and removal of foreign body   HERNIA REPAIR     Multiple incisional hernias.   ileoanal anastomosis      Outpatient Medications Prior to Visit  Medication Sig Dispense Refill   acetaminophen (TYLENOL) 325 MG tablet Take 2 tablets (650 mg total) by mouth every 6 (six) hours as needed for moderate pain or mild pain.     amLODipine (NORVASC) 10 MG tablet Take 1 tablet (10 mg total) by mouth daily. 90 tablet 0   ascorbic acid (VITAMIN C) 500 MG tablet Take 1 tablet (500 mg total) by mouth daily. 30 tablet 0   cetirizine (ZYRTEC) 10 MG tablet Take 10 mg  by mouth daily as needed for allergies.      Cholecalciferol (VITAMIN D3) 1.25 MG (50000 UT) CAPS Take 1 capsule (50,000 Units total) by mouth once a week. 4 capsule 5   ciclopirox (PENLAC) 8 % solution Apply topically at bedtime.     ciprofloxacin (CIPRO) 500 MG tablet Take 500 mg by mouth.     cloNIDine (CATAPRES) 0.1 MG tablet TAKE 1 TABLET(0.1 MG) BY MOUTH THREE TIMES DAILY 90 tablet 11   diclofenac Sodium (VOLTAREN) 1 % GEL Apply 4 g topically 4 (four) times daily as needed. 100 g 3   dicyclomine (BENTYL) 10 MG capsule Take 10 mg by mouth in the morning, at noon, in the evening, and at bedtime.     diphenoxylate-atropine (LOMOTIL) 2.5-0.025 MG tablet Take 1 tablet by mouth 4 (four) times daily.     ezetimibe (ZETIA) 10 MG tablet Take 1 tablet (10 mg total) by mouth daily. TAKE 1 TABLET(10 MG) BY MOUTH DAILY 90 tablet 3   famotidine (PEPCID) 40 MG tablet TAKE 1 TABLET(40 MG) BY MOUTH DAILY 90 tablet 0   Glucosamine 500 MG CAPS Take by mouth.     hydrALAZINE (APRESOLINE) 100 MG tablet Take 1 tablet (100 mg total) by mouth 3 (three) times daily. 270 tablet 3   Krill Oil (OMEGA-3) 500 MG CAPS Take by mouth.     lisinopril (ZESTRIL) 5 MG tablet Take 1 tablet (5 mg total) by mouth daily. 90 tablet 3   loperamide (IMODIUM) 2 MG capsule Take 2 capsules (4 mg  total) by mouth as needed for diarrhea or loose stools. 30 capsule 0   melatonin 3 MG TABS tablet Take 1 tablet (3 mg total) by mouth at bedtime. 30 tablet 0   methocarbamol (ROBAXIN) 500 MG tablet Take 500 mg by mouth.     Multiple Vitamin (MULTIVITAMIN WITH MINERALS) TABS Take 1 tablet by mouth daily.     Omega-3 Fatty Acids (FISH OIL) 1000 MG CAPS Take by mouth.     pantoprazole (PROTONIX) 40 MG tablet TAKE 1 TABLET(40 MG) BY MOUTH DAILY 90 tablet 0   psyllium (METAMUCIL) 58.6 % packet Take 1 packet by mouth daily as needed (For fiber/thickener).      QUEtiapine (SEROQUEL) 25 MG tablet TAKE 1 TABLET(25 MG) BY MOUTH AT BEDTIME (Patient taking  differently: Take 25 mg by mouth 2 (two) times daily.) 90 tablet 0   rosuvastatin (CRESTOR) 5 MG tablet Take 1 tablet (5 mg total) by mouth daily. 30 tablet 5   terbinafine (LAMISIL) 250 MG tablet Take 1 tablet (250 mg total) by mouth daily. 30 tablet 3   Turmeric (QC TUMERIC COMPLEX PO) Take 1 tablet by mouth daily as needed (For inflammatory).     UNABLE TO FIND Med Name: IVERMETIN paste reported by patient OTC     vitamin B-12 (CYANOCOBALAMIN) 100 MCG tablet Take 1 tablet (100 mcg total) by mouth daily. 30 tablet 0   celecoxib (CELEBREX) 100 MG capsule TAKE 1 CAPSULE(100 MG) BY MOUTH TWICE DAILY WITH FOOD AS NEEDED FOR PAIN (Patient not taking: Reported on 09/24/2023) 60 capsule 0   rifaximin (XIFAXAN) 550 MG TABS tablet Take 1 tablet (550 mg total) by mouth 2 (two) times daily. (Patient not taking: Reported on 09/24/2023) 42 tablet 4   No facility-administered medications prior to visit.    Family History  Problem Relation Age of Onset   Neurodegenerative disease Mother    Cancer Father        prostate cancer   Cancer Sister    Diabetes Paternal Grandmother    Stroke Paternal Grandfather     Social History   Socioeconomic History   Marital status: Single    Spouse name: Not on file   Number of children: Not on file   Years of education: Not on file   Highest education level: Not on file  Occupational History    Comment: restaurant   Occupation: [employer]  Tobacco Use   Smoking status: Never   Smokeless tobacco: Never  Vaping Use   Vaping status: Never Used  Substance and Sexual Activity   Alcohol use: Yes    Alcohol/week: 21.0 standard drinks of alcohol    Types: 21 Cans of beer per week   Drug use: Not Currently    Types: Cocaine   Sexual activity: Not Currently  Other Topics Concern   Not on file  Social History Narrative   Not on file   Social Drivers of Health   Financial Resource Strain: Low Risk  (08/19/2023)   Received from [facility]   Overall  Financial Resource Strain (CARDIA)    Difficulty of Paying Living Expenses: Not hard at all  Recent Concern: Financial Resource Strain - Medium Risk (08/06/2023)   Overall Financial Resource Strain (CARDIA)    Difficulty of Paying Living Expenses: Somewhat hard  Food Insecurity: No Food Insecurity (08/19/2023)   Received from [facility]   Hunger Vital Sign    Worried About Running Out of Food in the Last Year: Never true  Ran Out of Food in the Last Year: Never true  Transportation Needs: No Transportation Needs (08/19/2023)   Received from [facility] - Transportation    Lack of Transportation (Medical): No    Lack of Transportation (Non-Medical): No  Physical Activity: Not on File (10/03/2022)   Received from [facility]   Physical Activity    Physical Activity: 0  Stress: Not on File (10/03/2022)   Received from [facility]   Stress    Stress: 0  Social Connections: Not on File (03/02/2023)   Received from [facility]   Social Connections    Connectedness: 0  Intimate Partner Violence: Not At Risk (11/28/2022)   Received from [facility]   Humiliation, Afraid, Rape, and Kick questionnaire    Fear of Current or Ex-Partner: No    Emotionally Abused: No    Physically Abused: No    Sexually Abused: No                                                                                                  Objective:  Physical Exam: BP 134/84   Pulse 70   Temp (!) 97.2 F (36.2 C) (Temporal)   Ht 5\' 11"  (1.803 m)   Wt 210 lb 3.2 oz (95.3 kg)   SpO2 97%   BMI 29.32 kg/m      Physical Exam Constitutional:      Appearance: Normal appearance.  HENT:     Head: Normocephalic and atraumatic.     Right Ear: Hearing normal.     Left Ear: Hearing normal.     Nose: Nose normal.  Eyes:     General: No scleral icterus.       Right eye: No discharge.        Left eye: No discharge.     Extraocular Movements: Extraocular movements intact.  Cardiovascular:     Rate and Rhythm: Normal  rate and regular rhythm.     Heart sounds: Normal heart sounds.  Pulmonary:     Effort: Pulmonary effort is normal.     Breath sounds: Normal breath sounds.  Abdominal:     Palpations: Abdomen is soft.     Tenderness: There is no abdominal tenderness.  Skin:    General: Skin is warm.     Findings: No rash.  Neurological:     General: No focal deficit present.     Mental Status: He is alert.     Cranial Nerves: No cranial nerve deficit.  Psychiatric:        Mood and Affect: Mood normal. Affect is angry.        Behavior: Behavior is agitated. Behavior is cooperative.        Thought Content: Thought content does not include homicidal or suicidal ideation.        Judgment: Judgment is not inappropriate.     No results found.  Recent Results (from the past 2160 hours)  POCT glycosylated hemoglobin (Hb A1C)     Status: Abnormal   Collection Time: 07/21/23  1:40 PM  Result Value Ref Range   Hemoglobin  A1C 6.2 (A) 4.0 - 5.6 %   HbA1c POC (<> result, manual entry)     HbA1c, POC (prediabetic range)     HbA1c, POC (controlled diabetic range)          Carnell Christian, MD, MS

## 2023-09-28 MED FILL — SKYRIZI 360 MG/2.4 ML (150 MG/ML) SUBCUTANEOUS WEARABLE INJECTOR: 56 days supply | Qty: 2.4 | Fill #1

## 2023-10-01 DIAGNOSIS — K610 Anal abscess: Principal | ICD-10-CM

## 2023-10-01 DIAGNOSIS — K603 Perianal fistula: Principal | ICD-10-CM

## 2023-10-01 MED ORDER — CIPROFLOXACIN 500 MG TABLET
ORAL_TABLET | Freq: Two times a day (BID) | ORAL | 2 refills | 30.00 days | Status: CP
Start: 2023-10-01 — End: ?

## 2023-10-05 ENCOUNTER — Ambulatory Visit: Payer: Self-pay

## 2023-10-05 NOTE — Patient Instructions (Signed)
 Visit Information  Thank you for taking time to visit with me today. Please don't hesitate to contact me if I can be of assistance to you before our next scheduled appointment.  Your next care management appointment is no further scheduled appointments.      Please call the care guide team at [phone] if you need to cancel, schedule, or reschedule an appointment.   Please call the Suicide and Crisis Lifeline: 988 call the [place]  National Suicide Prevention Lifeline: (716)377-3922 or TTY: 2052722180 TTY 4352964442) to talk to a trained counselor call 1-800-273-TALK (toll free, 24 hour hotline) go to [facility] [address] [place] [phone]) call 911 if you are experiencing a Mental Health or Behavioral Health Crisis or need someone to talk to.  Valora Gear, Florestine Hurl, MHA [place]  [facility] Social Worker, Population Health [phone]

## 2023-10-09 ENCOUNTER — Other Ambulatory Visit: Payer: Self-pay | Admitting: Family Medicine

## 2023-10-09 DIAGNOSIS — K219 Gastro-esophageal reflux disease without esophagitis: Secondary | ICD-10-CM

## 2023-10-16 ENCOUNTER — Other Ambulatory Visit: Payer: Self-pay | Admitting: Family Medicine

## 2023-10-16 DIAGNOSIS — E118 Type 2 diabetes mellitus with unspecified complications: Secondary | ICD-10-CM

## 2023-10-16 DIAGNOSIS — G479 Sleep disorder, unspecified: Secondary | ICD-10-CM

## 2023-10-16 NOTE — Telephone Encounter (Unsigned)
 Copied from CRM [id number]. Topic: Clinical - Medication Refill >> Oct 16, 2023  1:33 PM Marlan Silva wrote: Most Recent Primary Care Visit:  Provider: Catheryn Cluck  Department: [facility]  Visit Type: OFFICE VISIT  Date: 09/24/2023  Medication: lisinopril  (ZESTRIL ) 5 MG tablet, QUEtiapine  (SEROQUEL ) 25 MG tablet  Has the patient contacted their pharmacy? Yes (Agent: If no, request that the patient contact the pharmacy for the refill. If patient does not wish to contact the pharmacy document the reason why and proceed with request.) (Agent: If yes, when and what did the pharmacy advise?)  Is this the correct pharmacy for this prescription? Yes If no, delete pharmacy and type the correct one.  This is the patient's preferred pharmacy:  [facility] [phone] - [place], [place] - [address] AT [facility] [address] [place] [address] Phone: [phone] Fax: [phone]   Has the prescription been filled recently? Yes  Is the patient out of the medication? Yes  Has the patient been seen for an appointment in the last year OR does the patient have an upcoming appointment? Yes  Can we respond through MyChart? Yes  Agent: Please be advised that Rx refills may take up to 3 business days. We ask that you follow-up with your pharmacy.

## 2023-10-16 NOTE — Telephone Encounter (Signed)
 Requesting: QUEtiapine  (SEROQUEL ) 25 MG tablet,  lisinopril  (ZESTRIL ) 5 MG tablet  Last Visit: 09/24/2023 Next Visit: 11/19/2023 Last Refill: 08/04/2022, 07/21/2023  Please Advise

## 2023-10-16 NOTE — Telephone Encounter (Signed)
 Copied from CRM [id number]. Topic: Clinical - Medication Refill >> Oct 16, 2023  9:47 AM Annelle Kiel wrote: Most Recent Primary Care Visit:  Provider: Catheryn Cluck  Department: [facility]  Visit Type: OFFICE VISIT  Date: 09/24/2023  Medication: QUEtiapine  (SEROQUEL ) 25 MG tablet   lisinopril  (ZESTRIL ) 5 MG tablet  Has the patient contacted their pharmacy? Yes (Agent: If no, request that the patient contact the pharmacy for the refill. If patient does not wish to contact the pharmacy document the reason why and proceed with request.) (Agent: If yes, when and what did the pharmacy advise?)  Is this the correct pharmacy for this prescription? Yes If no, delete pharmacy and type the correct one.  This is the patient's preferred pharmacy:  [facility] [phone] - [place], [place] - [address] AT [facility] [address] [place] [address] Phone: [phone] Fax: [phone]   Has the prescription been filled recently? No  Is the patient out of the medication? Yes  Has the patient been seen for an appointment in the last year OR does the patient have an upcoming appointment? Yes  Can we respond through MyChart? Yes  Agent: Please be advised that Rx refills may take up to 3 business days. We ask that you follow-up with your pharmacy.

## 2023-10-20 MED ORDER — LISINOPRIL 5 MG PO TABS: 5.0000 mg | ORAL_TABLET | Freq: Every day | ORAL | 3 refills | Status: AC

## 2023-10-20 MED ORDER — QUETIAPINE FUMARATE 25 MG PO TABS: 25.0000 mg | ORAL_TABLET | Freq: Two times a day (BID) | ORAL | 0 refills | Status: DC

## 2023-10-27 ENCOUNTER — Telehealth: Payer: Self-pay | Admitting: Family Medicine

## 2023-10-27 NOTE — Telephone Encounter (Signed)
 Called Pt and provided location and contact information regarding referral. Pt verbalized understanding and a thank you, MyChart message also sent.

## 2023-10-27 NOTE — Telephone Encounter (Signed)
 Copied from CRM [id number]. Topic: Referral - Status >> Oct 27, 2023 11:40 AM Magdalene School wrote: Reason for CRM: Patient is calling to check on status of therapist referral which he stated was done during his lat appointment on 09/24/23.

## 2023-11-10 NOTE — Unmapped (Signed)
 It was medically necessary for me to see the patient in addition to the APP because of the complexity of Mr Gehret case and lack of response to previous therapy.  My visit included a discussion of the clinical  symptoms, a consideration of further testing, and evaluation of treatment options.     Tony Franks, MD, PhD   [facility] GASTROENTEROLOGY CONSULTATION VISIT      REFERRING PROVIDER:  Patria Bookbinder, MD  [address]  [id number]  [place],  [place] [address]    PRIMARY CARE PROVIDER:  Everlyn Hockey, MD      PATIENT PROFILE:        Tony Andrade is a 63 y.o. 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     CHIEF COMPLAINT: follow-up      HISTORY OF PRESENT ILLNESS: Tony Andrade is a 63 y.o. 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      From a GI perspective, he continues on the Skyrizi every 8 weeks with the last dose 10/21/2023. He reports that he was having 4-5 loose bowel movements without blood, urgency or abdominal pain. Approximately 1 week ago he ate some mac-n-cheese that tasted funny and he has had GI upset since. He is having constant gastric activity with abdominal distention, looking pregnant. He is having 10 BMs during the day and 4-5 overnight. He is now seeing blood on the toilet tissue with abdominal cramping and urgency (not like past). He is trying to hydrate and has been avoiding eating due to the above but also has diminished appetite. He also has been taking Lomotil 1 tab twice daily to help with frequency.     Lastly, he denies any drainage from fistula on his left buttock. No HS flares.    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     Bowel frequency /24 hours 6-10  Bowel frequency during night > 4  Blood in bowel movements none  Abdominal pain  Mild = 1  Bloating frequent  Incontinence none  Bodyweight stable  Loperamide Yes 2 tablets  Lomotil Yes 2 tablets  Hyoscyamine:  No  Antibiotic Other  Cipro       PROBLEMS:  Patient Active Problem List   Diagnosis    Essential hypertension    H/O total colectomy    Pouchitis      Gastroesophageal reflux disease without esophagitis    S/P hernia repair    Foreign body (FB) in soft tissue    Crohn's disease of small intestine with other complication      Renal cell carcinoma of right kidney       REVIEW OF SYSTEMS:     The balance of 12 systems reviewed is negative except as noted in the HPI.     PAST MEDICAL HISTORY:    Past Medical History:   Diagnosis Date    GERD (gastroesophageal reflux disease)     H/O colectomy     Hypertension     Pouchitis      Stroke      Ulcerative colitis         MEDICATIONS:      Current Outpatient Medications:     acetaminophen (TYLENOL) 500 MG tablet, Take 1 tablet (500  mg total) by mouth every six (6) hours as needed for pain., Disp: , Rfl:     activated charcoal, bulk, Powd, by Miscellaneous route daily as needed., Disp: , Rfl:     ascorbic acid, vitamin C, (VITAMIN C) 500 MG tablet, Take 1 tablet (500 mg total) by mouth two (2) times a day., Disp: , Rfl:     cefdinir (OMNICEF) 300 MG capsule, Take 1 capsule (300 mg total) by mouth two (2) times a day., Disp: 180 capsule, Rfl: 3    cetirizine (ZYRTEC) 10 MG tablet, Take 1 tablet (10 mg total) by mouth daily., Disp: , Rfl:     cholecalciferol, vitamin D3-1,250 mcg, 50,000 unit,, 1,250 mcg (50,000 unit) capsule, Take 1 capsule (1,250 mcg total) by mouth once a week., Disp: 12 capsule, Rfl: 1    ciprofloxacin HCl (CIPRO) 500 MG tablet, TAKE 1 TABLET(500 MG) BY MOUTH TWICE DAILY, Disp: 60 tablet, Rfl: 2    cyanocobalamin 100 MCG tablet, Take 1 tablet (100 mcg total) by mouth daily., Disp: , Rfl:     dicyclomine (BENTYL) 10 mg capsule, Take 1 capsule (10 mg total) by mouth four (4) times a day as needed., Disp: 360 capsule, Rfl: 3 empty container Misc, Use as directed, Disp: 1 each, Rfl: 3    ezetimibe (ZETIA) 10 mg tablet, Take 1 tablet (10 mg total) by mouth daily., Disp: , Rfl:     famotidine (PEPCID) 40 MG tablet, Take 1 tablet (40 mg total) by mouth daily as needed for heartburn., Disp: , Rfl:     glucosamine sulfate (GLUCOSAMINE ORAL), Take 1 tablet by mouth daily at 0600., Disp: , Rfl:     hydrALAZINE (APRESOLINE) 50 MG tablet, Take 2 tablets (100 mg total) by mouth two (2) times a day., Disp: , Rfl:     hydrocortisone (ANUSOL-HC) 2.5 % rectal cream, Insert into the rectum two (2) times a day., Disp: 30 g, Rfl: 0    ketoconazole (NIZORAL) 2 % cream, Two (2) times a day., Disp: , Rfl:     Lactobacillus acidophilus (PROBIOTIC ORAL), Take 1 capsule by mouth daily at 0600., Disp: , Rfl:     lisinopril (PRINIVIL,ZESTRIL) 5 MG tablet, Take 1 tablet (5 mg total) by mouth daily., Disp: , Rfl:     loperamide (IMODIUM) 2 mg capsule, Take 2 capsules (4 mg total) by mouth four (4) times a day as needed for diarrhea., Disp: 720 capsule, Rfl: 2    melatonin 5 mg cap, Take 10 mg by mouth nightly., Disp: , Rfl:     methocarbamoL (ROBAXIN) 500 MG tablet, Take 1 tablet (500 mg total) by mouth every eight (8) hours as needed., Disp: , Rfl:     multivitamin with minerals tablet, Take 1 tablet by mouth at bedtime., Disp: , Rfl:     OMEGA-3/DHA/EPA/FISH OIL (FISH OIL-OMEGA-3 FATTY ACIDS) 300-1,000 mg capsule, Take 2 capsules (2 g total) by mouth Two (2) times a day., Disp: , Rfl:     pantoprazole (PROTONIX) 40 MG tablet, Take 1 tablet (40 mg total) by mouth daily., Disp: , Rfl:     QUEtiapine (SEROQUEL) 25 MG tablet, Take 1 tablet (25 mg total) by mouth nightly., Disp: , Rfl:     rifAXIMin (XIFAXAN) 550 mg Tab, Take 1 tablet (550 mg total) by mouth two (2) times a day. (Patient not taking: Reported on 09/23/2023), Disp: 28 tablet, Rfl: 0    risankizumab-rzaa (SKYRIZI) 360 mg/2.4 mL (150 mg/mL) Injt, Inject the contents of 1  cartridge (360mg ) under the skin every 8 weeks, Disp: 2.4 mL, Rfl: 5    UNABLE TO FIND, Take 1 packet by mouth daily. Electrolyte Stamina  Power Pack, Disp: , Rfl:       ALLERGIES:    Mesalamine, Penicillins, Sulfa (sulfonamide antibiotics), Venom-honey bee, Atorvastatin, and Iodinated contrast media    SOCIAL HISTORY:    Social History     Socioeconomic History    Marital status: Single   Tobacco Use    Smoking status: Never    Smokeless tobacco: Never   Substance and Sexual Activity    Alcohol use: Yes    Drug use: No   Social History Narrative    Incarcerated in minimum security prison.  No job currently.       Social Drivers of Psychologist, prison and probation services Strain: Low Risk  (01/07/2023)    Received from [facility]    Overall Financial Resource Strain (CARDIA)     Difficulty of Paying Living Expenses: Not very hard   Food Insecurity: Not on File (03/12/2023)    Received from [employer] Insecurity     Food: 0   Transportation Needs: No Transportation Needs (01/07/2023)    Received from [facility] - Transportation     Lack of Transportation (Medical): No     Lack of Transportation (Non-Medical): No   Physical Activity: Not on File (10/03/2022)    Received from [facility]    Physical Activity     Physical Activity: 0   Stress: Not on File (10/03/2022)    Received from [facility]    Stress     Stress: 0   Social Connections: Not on File (03/02/2023)    Received from [employer]    Social Connections     Connectedness: 0   Housing: Low Risk  (01/07/2023)    Received from [facility] Stability Vital Sign     Unable to Pay for Housing in the Last Year: No     Number of Places Lived in the Last Year: 1     Unstable Housing in the Last Year: No       FAMILY HISTORY:    family history includes Other in his mother; Stroke in his father.     VITAL SIGNS:    There were no vitals taken for this visit.      Wt Readings from Last 3 Encounters:   07/09/23 97.7 kg (215 lb 6.4 oz)   04/30/23 95.6 kg (210 lb 12.8 oz)   11/28/22 95.7 kg (211 lb) Telehealth - NOT DONE    PHYSICAL EXAM:    Constitutional:   Alert, oriented x 3, no acute distress, well nourished, and well hydrated.   Mental Status:   Thought organized, appropriate affect, pleasantly interactive, not anxious appearing.   HEENT:   PEERL, conjunctiva clear, anicteric, oropharynx clear, neck supple, no LAD.   Respiratory: Rales bilateral upper lobes, unlabored breathing, + cough     Cardiac: Euvolemic, regular rate and rhythm, normal S1 and S2, no murmur.     Abdomen: Soft, normal bowel sounds, non-distended, TTP UQ, no organomegaly or masses.     Perianal/Rectal Exam Not done      Extremities:   No edema, well perfused.   Musculoskeletal: No joint swelling or tenderness noted, no deformities.     Skin: No rashes, jaundice or skin lesions noted. Scar left armpit, history of boils.  Neuro: No focal deficits.            DIAGNOSTIC STUDIES:  I have reviewed all pertinent diagnostic studies, including:    Imaging:    MRI Abdomen 06/2023  Impression: Postablation changes of the right kidney without evidence of locally   recurrent or metastatic disease to the abdomen.     MRI Pelvis 07/2022:  Impression: Posterior perianal abscess in the intersphincteric space measuring up   to 2.1 cm.   This is associated with a left posterolateral, intersphincteric perianal   fistula which also extends inferiorly, terminating at the skin surface of   the medial left gluteal fold where there is a superficial, subcentimeter   fluid collection, likely the fistula drainage site and/or small   subcutaneous abscess.     CT 07/2022  Inflammatory changes are seen adjacent to the rectum. These findings are nonspecific and could represent underlying infectious or inflammatory process. No evidence of fistula or abscess, as clinically questioned.      - The proximal bowel anastomosis measures up to 7.3 cm. There is no upstream bowel dilation. Recommend correlation with symptoms.      - 1.7 cm exophytic right renal mass, which is suspicious for cortical neoplasm. Recommend dedicated MRI or CT (with renal protocol) for further evaluation.     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     Last pouchoscopy     07/2022:  Perianal exam notable for perianal fistula with                          active purulent drainage.                         - Rectal exam showed NO anal anastomotic stricture.                         - Rectal cuff was normal.                         - Mild inflammation in the pouch body with a several,                          shallow broad ulcers near the pouch inlet and at the                          pouch inlet                         - Neo-terminal ileum was normal for 40+ cm.                         - Ileo-ileal anastomosis was reached with likely                          chronic bowel dilation at the anastomotic site.        08/2018:   The perianal and digital rectal examinations were normal.       The neo-terminal ileum appeared normal. Biopsies were taken with a cold        forceps for histology.  Mild inflammation characterized by erosions was found in the distal        ileoanal pouch. This was graded as Pouchitis Disease Activity Index        (Endoscopic) Score 2. Biopsies were taken with a cold forceps for        histology.       There was a rectal cuff beginning at 1 cm from the anal verge,        characterized by healthy appearing mucosa. The cuff extended 2 cm in        length.    A: Pre-pouch, biopsy  - Small intestinal mucosa with no specific pathologic abnormality     B: Pouch, biopsy  - Small intestinal mucosa with mild Paneth cell hyperplasia and focal mild superficial acute inflammation, negative for dysplasia  - No CMV cytopathic effect or granulomas identified  01/2015: No significant inflammation pouch, prepouch or cuff.     Pathology from his colectomy (2003):   Colon, colectomy  - Moderate chronic active colitis with morphologic features consistent with ulcerative colitis  - Distal margin of resection involved with chronic active colitis  - Acute serositis      Labs    No visits with results within 1 Month(s) from this visit.   Latest known visit with results is:   Office Visit on 07/09/2023   Component Date Value Ref Range Status    CRP 07/09/2023 <4.0  <=10.0 mg/L Final    Sodium 07/09/2023 142  135 - 145 mmol/L Final    Potassium 07/09/2023 3.8  3.4 - 4.8 mmol/L Final    Chloride 07/09/2023 103  98 - 107 mmol/L Final    CO2 07/09/2023 25.0  20.0 - 31.0 mmol/L Final    Anion Gap 07/09/2023 14  5 - 14 mmol/L Final    BUN 07/09/2023 12  9 - 23 mg/dL Final    Creatinine 16/03/9603 1.25 (H)  0.73 - 1.18 mg/dL Final    BUN/Creatinine Ratio 07/09/2023 10   Final    eGFR CKD-EPI (2021) Male 07/09/2023 66  >=60 mL/min/1.56m2 Final    Glucose 07/09/2023 116  70 - 179 mg/dL Final    Calcium 54/02/8118 10.6 (H)  8.7 - 10.4 mg/dL Final    Albumin 14/78/2956 4.3  3.4 - 5.0 g/dL Final    Total Protein 07/09/2023 8.4 (H)  5.7 - 8.2 g/dL Final    Total Bilirubin 07/09/2023 0.4  0.3 - 1.2 mg/dL Final    AST 21/30/8657 20  <=34 U/L Final    ALT 07/09/2023 13  10 - 49 U/L Final    Alkaline Phosphatase 07/09/2023 88  46 - 116 U/L Final    WBC 07/09/2023 5.3  3.6 - 11.2 10*9/L Final    RBC 07/09/2023 4.64  4.26 - 5.60 10*12/L Final    HGB 07/09/2023 14.1  12.9 - 16.5 g/dL Final    HCT 84/69/6295 42.3  39.0 - 48.0 % Final    MCV 07/09/2023 91.1  77.6 - 95.7 fL Final    MCH 07/09/2023 30.4  25.9 - 32.4 pg Final    MCHC 07/09/2023 33.4  32.0 - 36.0 g/dL Final    RDW 28/41/3244 15.5 (H)  12.2 - 15.2 % Final    MPV 07/09/2023 8.0  6.8 - 10.7 fL Final    Platelet 07/09/2023 216  150 - 450 10*9/L Final    Neutrophils % 07/09/2023 53.2  % Final    Lymphocytes % 07/09/2023 30.4  % Final  Monocytes % 07/09/2023 6.6  % Final    Eosinophils % 07/09/2023 8.7  % Final    Basophils % 07/09/2023 1.1  % Final    Absolute Neutrophils 07/09/2023 2.8  1.8 - 7.8 10*9/L Final    Absolute Lymphocytes 07/09/2023 1.6  1.1 - 3.6 10*9/L Final    Absolute Monocytes 07/09/2023 0.3  0.3 - 0.8 10*9/L Final    Absolute Eosinophils 07/09/2023 0.5  0.0 - 0.5 10*9/L Final    Absolute Basophils 07/09/2023 0.1  0.0 - 0.1 10*9/L Final        ASSESSMENT:        1. 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 Chronic antibiotics-dependent pouchitis. On chronic ciprofloxacin therapy       Course of Disease:    Initially diagnosed with UC in 1995.    Between 1995 and 2002, he was treated with different 5-ASA compound, azathioprine, and systemic steroids.    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   07/2014 he underwent hernia repair and while hospitalized developed worsening diarrhea.  Treated with flagyl for 14 days along with his chronic Cipro therapy.  Responded initially but with recurrent symptoms as he completed the course.  Received a second round of flagyl (3/27-4/5), then went off both Cipro and flagyl (ran out the prescription for Cipro).  This resulted in worsening diarrhea.    10/04/2014 first presentation at the pouch clinic. He was started on Cipro and flagyl for 2 weeks, then alternate between the 2 for a week at a time.  Flagyl no effect later on 01/2015 start alternating doxy and Cipro, attempted initiation of Hyoscyamine   04/2015 Cipro mono due to no effectiveness of doxy, start of desipramine.   08/2014 his Desipramine was tapered to 10 mg po daily, which he has tolerated much better. He is having 4-6 BM per day.    05/2017 doing well on the combination of Cipro, hyoscyamine, desipramine low-dose 10 mg daily and Imodium   08/2018 severe diarrhea, switch to Entocort 9 mg and Cipro   10/2018 reoccurrence of diarrhea while on 6 mg of Entocort, increased bloating, Cipro stops working on steroid taper   01/2019 desipramine 10 mg to amitriptyline 25 mg, cefdinir 1 tablet twice daily as well as continue the budesonide 9 mg daily, later on switch back to Cipro twice daily   02/2019 steroid taper   03/2019 start of liraglutide with decrease of bowel frequency as well as Levaquin.    04/2019 switch back to Cipro, budesonide 9 mg as well as continuation of liraglutide.  01/2020 E. coli sepsis, seizure, new onset of diabetes.   07/2020: Stopped Entocort due to new onset IDDM. Also developed severe joint pains that improved significantly after cutting back on Cipro to 0.5 tab/day.  Later on trial of liraglutide which seems to work.    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) complicated by small R perinephric hematoma w/o mass effect, and hematuria   11/2022 start risankizumab for Crohn's like disease of the pouch  04/2023 nice response to [facility]  06/2023 worsening symptoms due to stress and diet; will short course of Cefdinir and increase Lomotil  10/2023 worsening of symptoms due to eating tainted food; will start short course of Xifaxan       Antibiotic use history:  - Ciprofloxacin: Responded well. But developed significant large joint pains and had to cut back on dose.  - Cefdinir: Responded well for a couple of weeks, then stopped working.  - Doxycycline: No response  - Flagyl: No response  - Bactrim, Augmentin: Not tried to due sulfa and penicillin allergies.  - Tinidazole: Prescribed but could not take due to cost issue.  - Vancomycin: Prescribed recently but could not take due to cost issue  - Xifaxan: responded well     Other medications:  - Entocort: Was on it for a long time, but had to stop due to new onset IDDM  - Using Imodium prn    Pouch/ perianal abscess/ intersphincteric fistula: Not stable on Cipro bid. Short course of Xifaxan tid and then return to Cipro bid. Will hold Bentyl for now due to lip swelling side effect. Will start Hyoscyamine up to 6 times as needed. Will increase Lomotil 2 tabs bid and may need prior to bedtime if nocturnal awakenings continue.    Upper Respiratory Infection: resolved    Hidradenitis suppurative: Since age 77 recurrent boils axilla. Not a problem at this time.    Acute epigastric abdominal pain: Has resolved.    Right papillary renal cell cancer: Cryotherapy in 09/2022, in follow-up     Stroke: Patient suffered hemorrhagic stroke end of June 2022, was hospitalized for 4 weeks with excellent rehabilitation.      Diabetes: Currently not on medication, last hemoglobin A1c 6.0 July 2024.     Labs : Will obtain labs today     Reflux: Currently no problem on PPI.    IBD health maintenance:  Influenza vaccine:  nd  Pneumonia vaccine: Prevnar 20 08/2022  Hepatitis B: pending  TB testing: pending  Chickenpox/Shingles history: 2nd Shingrix today  Bone densitometry:  nd       Recommendations:     Patient Instructions   Continue Skyrizi injections every 8 weeks  Start two week course of Xifaxan three times daily  Will obtain labs locally at [facility]  Can start Hyoscyamine 0.125 mg every 4 hours or Tylenol for pain  Prevention: will need updated Tdap this year; otherwise up to date  Return in 6 weeks or sooner as needed        Patient seen and discussed with Dr. Aretha Kubas.         Alverta Avers  IBD Nurse Practitioner      I personally spent 40 minutes face-to-face and non-face-to-face in the care of this patient, which includes all pre, intra, and post visit time on the date of service.  All documented time was specific to the E/M visit and does not include any procedures that may have been performed.      The patient reports they are physically located in [place]  and is currently: at home. I conducted a audio/video visit. I spent 25 minutes on the video call with the patient. I spent an additional 15 minutes on pre- and post-visit activities on the date of service .

## 2023-11-11 ENCOUNTER — Encounter
Admit: 2023-11-11 | Discharge: 2023-11-12 | Payer: PRIVATE HEALTH INSURANCE | Attending: Gastroenterology | Primary: Gastroenterology

## 2023-11-11 DIAGNOSIS — D84821 Immunosuppression due to drug therapy (HHS-HCC): Principal | ICD-10-CM

## 2023-11-11 DIAGNOSIS — K580 Irritable bowel syndrome with diarrhea: Principal | ICD-10-CM

## 2023-11-11 DIAGNOSIS — Z79899 Other long term (current) drug therapy: Principal | ICD-10-CM

## 2023-11-11 DIAGNOSIS — K91850 Pouchitis: Principal | ICD-10-CM

## 2023-11-11 MED ORDER — RIFAXIMIN 200 MG TABLET
ORAL_TABLET | Freq: Three times a day (TID) | ORAL | 0 refills | 14.00000 days | Status: CP
Start: 2023-11-11 — End: 2023-11-25

## 2023-11-11 MED ORDER — HYOSCYAMINE SULFATE 0.125 MG TABLET
ORAL_TABLET | ORAL | 3 refills | 15.00000 days | Status: CP | PRN
Start: 2023-11-11 — End: 2024-11-10

## 2023-11-11 NOTE — Unmapped (Addendum)
 Continue Skyrizi injections every 8 weeks  Start two week course of Xifaxan three times daily  Will obtain labs locally at [facility]  Can start Hyoscyamine 0.125 mg every 4 hours or Tylenol for pain  Prevention: will need updated Tdap this year; otherwise up to date  Return in 6 weeks or sooner as needed

## 2023-11-12 DIAGNOSIS — K580 Irritable bowel syndrome with diarrhea: Principal | ICD-10-CM

## 2023-11-12 LAB — MAGNESIUM: MAGNESIUM: 2.1 mg/dL (ref 1.6–2.3)

## 2023-11-12 LAB — COMPREHENSIVE METABOLIC PANEL
ALBUMIN: 4.4 g/dL (ref 3.8–4.9)
ALKALINE PHOSPHATASE: 79 IU/L (ref 44–121)
ALT (SGPT): 11 IU/L (ref 0–44)
AST (SGOT): 25 IU/L (ref 0–40)
BILIRUBIN TOTAL (MG/DL) IN SER/PLAS: 0.5 mg/dL (ref 0.0–1.2)
BLOOD UREA NITROGEN: 14 mg/dL (ref 8–27)
BUN / CREAT RATIO: 10 (ref 10–24)
CALCIUM: 10 mg/dL (ref 8.6–10.2)
CHLORIDE: 107 mmol/L — ABNORMAL HIGH (ref 96–106)
CO2: 19 mmol/L — ABNORMAL LOW (ref 20–29)
CREATININE: 1.38 mg/dL — ABNORMAL HIGH (ref 0.76–1.27)
EGFR: 59 mL/min/{1.73_m2} — ABNORMAL LOW
GLOBULIN, TOTAL: 2.6 g/dL (ref 1.5–4.5)
GLUCOSE: 95 mg/dL (ref 70–99)
POTASSIUM: 5.6 mmol/L — ABNORMAL HIGH (ref 3.5–5.2)
SODIUM: 141 mmol/L (ref 134–144)
TOTAL PROTEIN: 7 g/dL (ref 6.0–8.5)

## 2023-11-12 LAB — CBC W/ DIFFERENTIAL
BANDED NEUTROPHILS ABSOLUTE COUNT: 0 10*3/uL (ref 0.0–0.1)
BASOPHILS ABSOLUTE COUNT: 0 10*3/uL (ref 0.0–0.2)
BASOPHILS RELATIVE PERCENT: 1 %
EOSINOPHILS ABSOLUTE COUNT: 0.3 10*3/uL (ref 0.0–0.4)
EOSINOPHILS RELATIVE PERCENT: 6 %
HEMATOCRIT: 35.4 % — ABNORMAL LOW (ref 37.5–51.0)
HEMOGLOBIN: 11.7 g/dL — ABNORMAL LOW (ref 13.0–17.7)
IMMATURE GRANULOCYTES: 0 %
LYMPHOCYTES ABSOLUTE COUNT: 1.8 10*3/uL (ref 0.7–3.1)
LYMPHOCYTES RELATIVE PERCENT: 32 %
MEAN CORPUSCULAR HEMOGLOBIN CONC: 33.1 g/dL (ref 31.5–35.7)
MEAN CORPUSCULAR HEMOGLOBIN: 30.7 pg (ref 26.6–33.0)
MEAN CORPUSCULAR VOLUME: 93 fL (ref 79–97)
MONOCYTES ABSOLUTE COUNT: 0.6 10*3/uL (ref 0.1–0.9)
MONOCYTES RELATIVE PERCENT: 12 %
NEUTROPHILS ABSOLUTE COUNT: 2.7 10*3/uL (ref 1.4–7.0)
NEUTROPHILS RELATIVE PERCENT: 49 %
PLATELET COUNT: 189 10*3/uL (ref 150–450)
RED BLOOD CELL COUNT: 3.81 x10E6/uL — ABNORMAL LOW (ref 4.14–5.80)
RED CELL DISTRIBUTION WIDTH: 14.9 % (ref 11.6–15.4)
WHITE BLOOD CELL COUNT: 5.5 10*3/uL (ref 3.4–10.8)

## 2023-11-12 LAB — C-REACTIVE PROTEIN: C-REACTIVE PROTEIN: 3 mg/L (ref 0–10)

## 2023-11-12 MED ORDER — RIFAXIMIN 550 MG TABLET
ORAL_TABLET | Freq: Two times a day (BID) | ORAL | 0 refills | 14.00000 days | Status: CP
Start: 2023-11-12 — End: ?

## 2023-11-12 NOTE — Unmapped (Signed)
 Xifaxan PA needed, ePA pushed.

## 2023-11-17 NOTE — Unmapped (Signed)
 Approval in place but [facility] still not able to process.  PA submitted for commercial coverage.

## 2023-11-18 NOTE — Unmapped (Signed)
 Xifaxin denied by [employer] but approved by [facility] Medicaid/Medicare. [facility] able to fill now.    Madelene Schanz, PharmD, BCPS, CPP  Clinical Pharmacist Practitioner, GI/IBD  [phone]

## 2023-11-18 NOTE — Unmapped (Signed)
 [facility] Refill Coordination Note    Specialty Medication(s) to be Shipped:   Inflammatory Disorders: Skyrizi    Other medication(s) to be shipped: No additional medications requested for fill at this time     Tony Andrade, DOB: January 10, 1961  Phone: [phone] (home)       All above HIPAA information was verified with patient.     Was a Nurse, learning disability used for this call? No    Completed refill call assessment today to schedule patient's medication shipment from the [facility] Delivery Pharm[facility]  [phone]).  All relevant notes have been reviewed.     Specialty medication(s) and dose(s) confirmed: Regimen is correct and unchanged.   Changes to medications: [name] reports no changes at this time.  Changes to insurance: No  New side effects reported not previously addressed with a pharmacist or physician: None reported  Questions for the pharmacist: No    Confirmed patient received a Conservation officer, historic buildings and a Surveyor, mining with first shipment. The patient will receive a drug information handout for each medication shipped and additional FDA Medication Guides as required.       DISEASE/MEDICATION-SPECIFIC INFORMATION        For patients on injectable medications: Patient currently has 0 doses left.  Next injection is scheduled for next week.    SPECIALTY MEDICATION ADHERENCE     Medication Adherence    Patient reported X missed doses in the last month: 0  Specialty Medication: SKYRIZI 360 mg/2.4 mL  Patient is on additional specialty medications: No              Were doses missed due to medication being on hold? No    Skyrizi 360/2.4mg /ml: 0 doses of medicine on hand        REFERRAL TO PHARMACIST     Referral to the pharmacist: Not needed      SHIPPING     Shipping address confirmed in Epic.     Cost and Payment: Patient has a $0 copay, payment information is not required.    Delivery Scheduled: Yes, Expected medication delivery date: 11/24/23.     Medication will be delivered via UPS to the prescription address in Epic WAM.    Canary Ceo   [facility]  Specialty Technician

## 2023-11-19 ENCOUNTER — Ambulatory Visit (INDEPENDENT_AMBULATORY_CARE_PROVIDER_SITE_OTHER): Payer: No Typology Code available for payment source | Admitting: Family Medicine

## 2023-11-19 VITALS — BP 120/80 | HR 78 | Temp 98.2°F | Ht 71.0 in | Wt 210.1 lb

## 2023-11-19 DIAGNOSIS — F419 Anxiety disorder, unspecified: Secondary | ICD-10-CM

## 2023-11-19 DIAGNOSIS — L608 Other nail disorders: Secondary | ICD-10-CM | POA: Diagnosis not present

## 2023-11-19 DIAGNOSIS — G9341 Metabolic encephalopathy: Secondary | ICD-10-CM

## 2023-11-19 MED ORDER — FLUCONAZOLE 150 MG PO TABS
150.0000 mg | ORAL_TABLET | ORAL | 1 refills | Status: DC
Start: 2023-11-19 — End: 2024-01-27

## 2023-11-19 MED ORDER — CLONIDINE HCL 0.1 MG PO TABS: ORAL_TABLET | ORAL | 11 refills | Status: DC

## 2023-11-19 NOTE — Progress Notes (Signed)
 Assessment & Plan   Assessment/Plan:     Assessment and Plan Assessment & Plan Onychomycosis Chronic onychomycosis of the right foot with slow improvement despite terbinafine  250 mg daily and Penlac (ciclopirox) solution. Persistent yellow discoloration of nails noted. Liver function tests are normal, allowing consideration of alternative antifungal therapy. Fluconazole is proposed due to its efficacy in terbinafine -resistant cases, with a once-weekly dosing regimen offering a different mechanism of action. - Discontinue terbinafine . - Prescribe fluconazole 150 mg once weekly for three months. - Continue Penlac (ciclopirox) solution if desired. - Re-evaluate in three months to assess treatment response. - Consider dermatology referral if no improvement.  Behavioral Health Referral He has not seen a therapist due to communication issues and lack of follow-up from the behavioral health office. He expressed dissatisfaction with the process and interaction with the staff. A new referral is necessary to ensure he receives support. - Initiate new referral to behavioral health. - Referral coordinator to ensure follow-up with behavioral health office. - Advise him to contact the office if no response within a month.      Medications Discontinued During This Encounter  Medication Reason   cloNIDine  (CATAPRES ) 0.1 MG tablet Patient Preference   rosuvastatin  (CRESTOR ) 5 MG tablet Patient Preference   ciclopirox (PENLAC) 8 % solution     Return in about 3 months (around 02/19/2024) for toe nail fungus.        Subjective:   Encounter date: 11/19/2023  Matthew Francis is a 63 y.o. male who has Essential hypertension; Ulcerative colitis (HCC); Sleep disorder; Hyperlipidemia; History of hemorrhagic cerebrovascular accident (CVA) with residual deficit; Muscle spasm; Gastroesophageal reflux disease; Acute right hip pain; Controlled type 2 diabetes mellitus with complication, without long-term  current use of insulin  (HCC); Cocaine abuse (HCC); Alcohol abuse; Recurrent major depressive disorder, in remission (HCC); Anxiety; Vitamin D  deficiency; B12 deficiency; Anemia; Lateral epicondylitis of left elbow; Ulnar neuropathy at elbow of right upper extremity; Vitreomacular adhesion of both eyes; Diabetes mellitus without complication (HCC); Nuclear sclerotic cataract of both eyes; Posterior subcapsular age-related cataract, both eyes; Renal lesion; Renal cell carcinoma (HCC); Onychomadesis of toenail; and Right arm pain on their problem list..   He  has a past medical history of Arthritis, 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 (02/09/2012), GERD (gastroesophageal reflux disease), H/O ulcerative colitis, Hyperlipidemia, Hypertension, ICH (intracerebral hemorrhage) (HCC) (12/11/2020), Inguinal hernia, Nontraumatic subcortical hemorrhage of brain (HCC), Severe sepsis (HCC) (01/17/2020), Thrombocytopenia (HCC) (01/17/2020), Ulcer, Ulcerative colitis, UTI due to extended-spectrum beta lactamase (ESBL) producing Escherichia coli (01/19/2020), and Wears glasses.Matthew Francis   He presents with chief complaint of Medical Management of Chronic Issues (4 month follow up on the nail fungus on the right feet, pt stated his has progress little ) .   Discussed the use of AI scribe software for clinical note transcription with the patient, who gave verbal consent to proceed.  History of Present Illness Matthew Francis "Pronounced DIE LAN" is a 63 year old male who presents for follow-up on onychomycosis.  He is experiencing slow improvement in his onychomycosis. He has been using both Penlac (ciclopirox) and terbinafine , with terbinafine  at a dose of 250 mg once daily. Despite treatment, there is still some yellow discoloration of the nail.  He recalls a recent incident where his son accidentally kicked a log while he was splitting wood, which affected his nail, but it no longer causes pain. He had his liver function  tested recently, with results available on his chart, and he had  a virtual appointment with his gastroenterologist.  He discusses his experience with behavioral health services, noting that he has not yet seen a therapist due to communication issues with the office. He describes a lack of follow-up after being told he would receive a questionnaire via email, which he never found. He has been waiting for four months without progress.  Socially, he recently resigned from a stressful job on May 15th, which has significantly reduced his stress levels. He describes the job as a negative experience and feels relieved after leaving, comparing it to 'Atlas lifting the world off his shoulders.'     ROS  Past Surgical History:  Procedure Laterality Date   APPENDECTOMY     COLECTOMY     Proctocolectomy with IPAA   COLECTOMY     FOREIGN BODY REMOVAL ABDOMINAL  02/17/2012   Procedure: REMOVAL FOREIGN BODY ABDOMINAL;  Surgeon: Harlee Lichtenstein, MD;  Location: [facility];  Service: General;  Laterality: N/A;  abdominal wound exploration and removal of foreign body   HERNIA REPAIR     Multiple incisional hernias.   ileoanal anastomosis      Outpatient Medications Prior to Visit  Medication Sig Dispense Refill   acetaminophen  (TYLENOL ) 325 MG tablet Take 2 tablets (650 mg total) by mouth every 6 (six) hours as needed for moderate pain or mild pain.     amLODipine  (NORVASC ) 10 MG tablet Take 1 tablet (10 mg total) by mouth daily. 90 tablet 0   ascorbic acid  (VITAMIN C) 500 MG tablet Take 1 tablet (500 mg total) by mouth daily. 30 tablet 0   celecoxib  (CELEBREX ) 50 MG capsule Take 1 capsule (50 mg total) by mouth 2 (two) times daily as needed for pain. 180 capsule 0   cetirizine (ZYRTEC) 10 MG tablet Take 10 mg by mouth daily as needed for allergies.      Cholecalciferol (VITAMIN D3) 1.25 MG (50000 UT) CAPS Take 1 capsule (50,000 Units total) by mouth once a week. 4 capsule 5    ciprofloxacin  (CIPRO ) 500 MG tablet Take 500 mg by mouth.     diclofenac  Sodium (VOLTAREN ) 1 % GEL Apply 4 g topically 4 (four) times daily as needed. 100 g 3   dicyclomine  (BENTYL ) 10 MG capsule Take 10 mg by mouth in the morning, at noon, in the evening, and at bedtime.     ezetimibe  (ZETIA ) 10 MG tablet Take 1 tablet (10 mg total) by mouth daily. TAKE 1 TABLET(10 MG) BY MOUTH DAILY 90 tablet 3   famotidine  (PEPCID ) 40 MG tablet TAKE 1 TABLET(40 MG) BY MOUTH DAILY 90 tablet 0   Glucosamine 500 MG CAPS Take by mouth.     hydrALAZINE  (APRESOLINE ) 100 MG tablet Take 1 tablet (100 mg total) by mouth 3 (three) times daily. 270 tablet 3   Krill Oil (OMEGA-3) 500 MG CAPS Take by mouth.     lisinopril  (ZESTRIL ) 5 MG tablet Take 1 tablet (5 mg total) by mouth daily. 90 tablet 3   loperamide  (IMODIUM ) 2 MG capsule Take 2 capsules (4 mg total) by mouth as needed for diarrhea or loose stools. 30 capsule 0   melatonin 3 MG TABS tablet Take 1 tablet (3 mg total) by mouth at bedtime. 30 tablet 0   methocarbamol  (ROBAXIN ) 500 MG tablet Take 500 mg by mouth.     Multiple Vitamin (MULTIVITAMIN WITH MINERALS) TABS Take 1 tablet by mouth daily.     Omega-3 Fatty Acids (FISH OIL ) 1000 MG CAPS  Take by mouth.     pantoprazole  (PROTONIX ) 40 MG tablet TAKE 1 TABLET(40 MG) BY MOUTH DAILY 90 tablet 0   psyllium (METAMUCIL) 58.6 % packet Take 1 packet by mouth daily as needed (For fiber/thickener).      QUEtiapine  (SEROQUEL ) 25 MG tablet TAKE 2 TABLETS BY MOUTH EVERY NIGHT AT BEDTIME 60 tablet 3   rifaximin  (XIFAXAN ) 550 MG TABS tablet Take 1 tablet (550 mg total) by mouth 2 (two) times daily. 42 tablet 4   Turmeric (QC TUMERIC COMPLEX PO) Take 1 tablet by mouth daily as needed (For inflammatory).     UNABLE TO FIND Med Name: IVERMETIN paste reported by patient OTC     vitamin B-12 (CYANOCOBALAMIN ) 100 MCG tablet Take 1 tablet (100 mcg total) by mouth daily. 30 tablet 0   ciclopirox (PENLAC) 8 % solution Apply topically  at bedtime.     cloNIDine  (CATAPRES ) 0.1 MG tablet TAKE 1 TABLET(0.1 MG) BY MOUTH THREE TIMES DAILY 90 tablet 11   rosuvastatin  (CRESTOR ) 5 MG tablet Take 1 tablet (5 mg total) by mouth daily. 30 tablet 5   No facility-administered medications prior to visit.    Family History  Problem Relation Age of Onset   Neurodegenerative disease Mother    Cancer Father        prostate cancer   Cancer Sister    Diabetes Paternal Grandmother    Stroke Paternal Grandfather     Social History   Socioeconomic History   Marital status: Single    Spouse name: Not on file   Number of children: Not on file   Years of education: Not on file   Highest education level: Some college, no degree  Occupational History    Comment: restaurant   Occupation: [employer]  Tobacco Use   Smoking status: Never   Smokeless tobacco: Never  Vaping Use   Vaping status: Never Used  Substance and Sexual Activity   Alcohol use: Yes    Alcohol/week: 21.0 standard drinks of alcohol    Types: 21 Cans of beer per week   Drug use: Not Currently    Types: Cocaine   Sexual activity: Not Currently  Other Topics Concern   Not on file  Social History Narrative   Not on file   Social Drivers of Health   Financial Resource Strain: Medium Risk (11/15/2023)   Overall Financial Resource Strain (CARDIA)    Difficulty of Paying Living Expenses: Somewhat hard  Food Insecurity: No Food Insecurity (11/15/2023)   Hunger Vital Sign    Worried About Running Out of Food in the Last Year: Never true    Ran Out of Food in the Last Year: Never true  Transportation Needs: No Transportation Needs (11/15/2023)   PRAPARE - Administrator, Civil Service (Medical): No    Lack of Transportation (Non-Medical): No  Physical Activity: Unknown (11/15/2023)   Exercise Vital Sign    Days of Exercise per Week: 0 days    Minutes of Exercise per Session: Not on file  Stress: Stress Concern Present (11/15/2023)   [employer] of  Occupational Health - Occupational Stress Questionnaire    Feeling of Stress : Rather much  Social Connections: Moderately Isolated (11/15/2023)   Social Connection and Isolation Panel [NHANES]    Frequency of Communication with Friends and Family: More than three times a week    Frequency of Social Gatherings with Friends and Family: Once a week    Attends Religious Services: 1 to  4 times per year    Active Member of Clubs or Organizations: No    Attends Banker Meetings: Not on file    Marital Status: Never married  Intimate Partner Violence: Not At Risk (11/28/2022)   Received from [facility]   Humiliation, Afraid, Rape, and Kick questionnaire    Fear of Current or Ex-Partner: No    Emotionally Abused: No    Physically Abused: No    Sexually Abused: No                                                                                                  Objective:  Physical Exam: BP 120/80   Pulse 78   Temp 98.2 F (36.8 C) (Temporal)   Ht 5\' 11"  (1.803 m)   Wt 210 lb 2 oz (95.3 kg)   SpO2 97%   BMI 29.31 kg/m    Physical Exam GENERAL: Alert, cooperative, well developed, no acute distress HEENT: Normocephalic, normal oropharynx, moist mucous membranes CHEST: Clear to auscultation bilaterally, no wheezes, rhonchi, or crackles CARDIOVASCULAR: Normal heart rate and rhythm, S1 and S2 normal without murmurs ABDOMEN: Soft, non-tender, non-distended, without organomegaly, normal bowel sounds EXTREMITIES: No cyanosis or edema. Right foot with onychomycosis, improving with treatment. Yellow discoloration and thickening present. NEUROLOGICAL: Cranial nerves grossly intact, moves all extremities without gross motor or sensory deficit     No results found.  No results found for this or any previous visit (from the past 2160 hours).      Carnell Christian, MD, MS

## 2023-11-19 NOTE — Patient Instructions (Signed)
  VISIT SUMMARY: Today, we discussed the slow improvement of your onychomycosis and your experience with behavioral health services. We also reviewed your recent liver function tests and your social situation after resigning from your job.  YOUR PLAN: -ONYCHOMYCOSIS: Onychomycosis is a fungal infection of the nails, causing discoloration and thickening. Since your current treatment with terbinafine  and Penlac (ciclopirox) has shown slow improvement, we will discontinue terbinafine  and start you on fluconazole 150 mg once weekly for three months. You can continue using Penlac if you wish. We will re-evaluate your condition in three months, and if there is no improvement, we may refer you to a dermatologist.  -BEHAVIORAL HEALTH REFERRAL: You have had difficulty connecting with a therapist due to communication issues with the behavioral health office. We will initiate a new referral to ensure you receive the support you need. Our referral coordinator will follow up with the behavioral health office, and you should contact them if you do not hear back within a month.  INSTRUCTIONS: Please discontinue terbinafine  and start taking fluconazole 150 mg once weekly for three months. Continue using Penlac (ciclopirox) solution if desired. We will re-evaluate your onychomycosis in three months. Additionally, expect a new referral to behavioral health, and contact the office if you do not receive a response within a month.

## 2023-11-23 MED FILL — SKYRIZI 360 MG/2.4 ML (150 MG/ML) SUBCUTANEOUS WEARABLE INJECTOR: SUBCUTANEOUS | 56 days supply | Qty: 2.4 | Fill #2

## 2023-11-26 MED ORDER — HYDROCORTISONE 2.5 % TOPICAL CREAM WITH PERINEAL APPLICATOR
Freq: Two times a day (BID) | RECTAL | 0 refills | 0.00000 days
Start: 2023-11-26 — End: ?

## 2023-11-27 MED ORDER — HYDROCORTISONE 2.5 % TOPICAL CREAM WITH PERINEAL APPLICATOR
Freq: Two times a day (BID) | RECTAL | 2 refills | 0.00000 days | Status: CP
Start: 2023-11-27 — End: ?

## 2023-11-28 MED ORDER — DIPHENOXYLATE-ATROPINE 2.5 MG-0.025 MG TABLET
ORAL_TABLET | 1 refills | 0.00000 days
Start: 2023-11-28 — End: ?

## 2023-11-29 MED ORDER — DIPHENOXYLATE-ATROPINE 2.5 MG-0.025 MG TABLET
ORAL_TABLET | ORAL | 1 refills | 0.00000 days | Status: CP
Start: 2023-11-29 — End: ?

## 2023-11-30 MED ORDER — CHOLECALCIFEROL (VITAMIN D3) 1,250 MCG (50,000 UNIT) CAPSULE
ORAL_CAPSULE | ORAL | 1 refills | 84.00000 days | Status: CP
Start: 2023-11-30 — End: ?

## 2023-11-30 NOTE — Unmapped (Signed)
 Patient is requesting the following refill  Requested Prescriptions     Pending Prescriptions Disp Refills    cholecalciferol, vitamin D3-1,250 mcg, 50,000 unit,, 1,250 mcg (50,000 unit) capsule 12 capsule 1     Sig: Take 1 capsule (1,250 mcg total) by mouth once a week.       Recent Visits  Date Type Provider Dept   11/11/23 Telemedicine Herfarth, Mikle Pfeiffer, MD [facility]   07/09/23 Office Visit Herfarth, Mikle Pfeiffer, MD [facility]   04/30/23 Office Visit Herfarth, Mikle Pfeiffer, MD [facility]   01/22/23 Telemedicine Elgin, Payal P, CPP [facility]   Showing recent visits within past 365 days and meeting all other requirements  Future Appointments  Date Type Provider Dept   12/17/23 Appointment Herfarth, Mikle Pfeiffer, MD [facility]   Showing future appointments within next 365 days and meeting all other requirements           Encounter for refill request:    Colonoscopy:   Appointments which have been scheduled for you      Dec 17, 2023 2:30 PM  (Arrive by 2:15 PM)  RETURN IRRITABLE BOWEL DISEASE with Mikle Pfeiffer Martinez, MD  [facility] GI MEDICINE [place] [facility]) [address]  Medical [facility] [address]  [place] [address]  [phone]             Lab Results   Component Value Date    WBC 5.5 11/11/2023    RBC 3.81 (L) 11/11/2023    HGB 11.7 (L) 11/11/2023    HCT 35.4 (L) 11/11/2023    PLT 189 11/11/2023    ALT 11 11/11/2023    AST 25 11/11/2023    ALKPHOS 79 11/11/2023    CRP 3 11/11/2023    CREATININE 1.38 (H) 11/11/2023           refills routed to provider

## 2023-12-10 ENCOUNTER — Telehealth: Payer: Self-pay | Admitting: Family Medicine

## 2023-12-10 NOTE — Telephone Encounter (Signed)
 Copied from CRM [id number]. Topic: Referral - Status >> Dec 10, 2023 11:45 AM Berneda FALCON wrote: Reason for CRM: Pt states that initially Dr Sebastian sent a referral for a therapist but it was not a good fit and met with Dr.Thompson again and he said he would send out another referral for him and did not get a call yet. States it has been 3 weeks. Can we please confirm? I did not see an additional referral for this in the chart. Last appt was 11/19/23 with Sebastian  Patient callback is [phone]

## 2023-12-10 NOTE — Telephone Encounter (Signed)
 Pt is requesting an  Epi Pen. 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 Pt has to take 4-5 Benadryl  tabs. I need this to keep on hand. Pt does not currently have any issues. Please advise.

## 2023-12-10 NOTE — Telephone Encounter (Unsigned)
 Copied from CRM [id number]. Topic: Clinical - Medication Refill >> Dec 10, 2023 11:50 AM Berneda FALCON wrote: Medication: Medication not listed-EpiPen  Has the patient contacted their pharmacy? No (Agent: If no, request that the patient contact the pharmacy for the refill. If patient does not wish to contact the pharmacy document the reason why and proceed with request.) (Agent: If yes, when and what did the pharmacy advise?)  This is the patient's preferred pharmacy:  [facility] [phone] - [place], [place] - [address] AT [facility] [address] [place] [address] Phone: [phone] Fax: [phone]  Is this the correct pharmacy for this prescription? Yes If no, delete pharmacy and type the correct one.   Has the prescription been filled recently? No  Is the patient out of the medication? Yes  Has the patient been seen for an appointment in the last year OR does the patient have an upcoming appointment? Yes  Can we respond through MyChart? Yes  Agent: Please be advised that Rx refills may take up to 3 business days. We ask that you follow-up with your pharmacy.

## 2023-12-15 ENCOUNTER — Ambulatory Visit (INDEPENDENT_AMBULATORY_CARE_PROVIDER_SITE_OTHER): Admitting: Family Medicine

## 2023-12-15 ENCOUNTER — Encounter: Payer: Self-pay | Admitting: Family Medicine

## 2023-12-15 VITALS — BP 110/68 | HR 68 | Temp 97.7°F | Ht 71.0 in | Wt 210.8 lb

## 2023-12-15 DIAGNOSIS — G479 Sleep disorder, unspecified: Secondary | ICD-10-CM

## 2023-12-15 DIAGNOSIS — F332 Major depressive disorder, recurrent severe without psychotic features: Secondary | ICD-10-CM

## 2023-12-15 DIAGNOSIS — Z1159 Encounter for screening for other viral diseases: Secondary | ICD-10-CM | POA: Diagnosis not present

## 2023-12-15 DIAGNOSIS — E118 Type 2 diabetes mellitus with unspecified complications: Secondary | ICD-10-CM

## 2023-12-15 DIAGNOSIS — Z91038 Other insect allergy status: Secondary | ICD-10-CM

## 2023-12-15 LAB — COMPREHENSIVE METABOLIC PANEL WITH GFR
ALT: 13 U/L (ref 0–53)
AST: 20 U/L (ref 0–37)
Albumin: 4.4 g/dL (ref 3.5–5.2)
Alkaline Phosphatase: 56 U/L (ref 39–117)
BUN: 19 mg/dL (ref 6–23)
CO2: 20 meq/L (ref 19–32)
Calcium: 9.7 mg/dL (ref 8.4–10.5)
Chloride: 110 meq/L (ref 96–112)
Creatinine, Ser: 1.42 mg/dL (ref 0.40–1.50)
GFR: 53.65 mL/min — ABNORMAL LOW (ref >60.00)
Glucose, Bld: 96 mg/dL (ref 70–99)
Potassium: 4 meq/L (ref 3.5–5.1)
Sodium: 140 meq/L (ref 135–145)
Total Bilirubin: 0.5 mg/dL (ref 0.2–1.2)
Total Protein: 7.4 g/dL (ref 6.0–8.3)

## 2023-12-15 LAB — MICROALBUMIN / CREATININE URINE RATIO
Creatinine,U: 142.4 mg/dL
Microalb Creat Ratio: 8.2 mg/g (ref 0.0–30.0)
Microalb, Ur: 1.2 mg/dL (ref 0.0–1.9)

## 2023-12-15 MED ORDER — EPINEPHRINE 0.3 MG/0.3ML IJ SOAJ: 0.3000 mg | INTRAMUSCULAR | 1 refills | Status: AC | PRN

## 2023-12-15 MED ORDER — ESCITALOPRAM OXALATE 10 MG PO TABS: 10.0000 mg | ORAL_TABLET | Freq: Every day | ORAL | 3 refills | Status: DC

## 2023-12-15 NOTE — Patient Instructions (Signed)
  VISIT SUMMARY: You came in today for a follow-up regarding your use of the EpiPen due to your history of bee sting allergies. We also discussed your mood, stress levels, and diabetes management.  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 To prevent severe allergic reactions, you should use an EpiPen in emergencies. Continue using Benadryl  and hydrocortisone  for local reactions.  -MOOD DISORDER: You are experiencing increased stress and frustration, which is affecting your mood. We are starting you on escitalopram 10 mg daily to help stabilize your mood. Continue taking quetiapine  for sleep and adjust the dose as needed. We are also working on getting you a referral to behavioral health services.  -DIABETES MELLITUS: Your recent lab results show that your blood sugar levels are within a manageable range, but we need to monitor your kidney function. We have ordered blood and urine tests for this purpose. Please continue to be mindful of your diet, especially your soda intake.  INSTRUCTIONS: Please follow up with the blood and urine tests to monitor your kidney function. We will also facilitate a referral to behavioral health services for additional support.

## 2023-12-15 NOTE — Progress Notes (Signed)
 Assessment & Plan   Assessment/Plan:    Assessment & Plan Hymenoptera allergy Allergic reactions to Hymenoptera stings, including bee and yellow jacket stings, with recent episodes causing significant symptoms such as feeling cold and throat itching, but no severe anaphylactic symptoms like throat swelling or difficulty swallowing. Managed with Benadryl  and hydrocortisone , but concerned about worsening reactions. EpiPen recommended for emergency use to prevent potential severe allergic reactions. - Prescribe EpiPen for emergency use in case of future stings - Continue Benadryl  and hydrocortisone  for local reactions  Mood disorder Increased agitation, frustration, and stress due to personal and social circumstances. Currently on quetiapine  for sleep, which aids mood, but considering additional treatment for mood stabilization. Open to trying escitalopram, an SSRI, for mood symptoms. Delay in accessing behavioral health services is being addressed. Escitalopram chosen for its efficacy in treating mood disorders without sedative effects of benzodiazepines. - Start escitalopram 10 mg daily - Continue quetiapine  for sleep, adjust dose as tolerated - Facilitate referral to behavioral health services  Diabetes mellitus Diabetes with recent lab results showing HbA1c of 6.2% and blood glucose level of 95 mg/dL. Mindful of dietary habits, including soda consumption, and due for routine monitoring of kidney function. - Order blood and urine tests to assess kidney function   There are no discontinued medications.  Return in about 1 month (around 01/15/2024) for depression.        Subjective:   Encounter date: 12/15/2023  Matthew Francis is a 63 y.o. male who has Essential hypertension; Ulcerative colitis (HCC); Sleep disorder; Hyperlipidemia; History of hemorrhagic cerebrovascular accident (CVA) with residual deficit; Muscle spasm; Gastroesophageal reflux disease; Acute right hip pain; Controlled  type 2 diabetes mellitus with complication, without long-term current use of insulin  (HCC); Cocaine abuse (HCC); Alcohol abuse; Recurrent major depressive disorder, in remission (HCC); Anxiety; Vitamin D  deficiency; B12 deficiency; Anemia; Lateral epicondylitis of left elbow; Ulnar neuropathy at elbow of right upper extremity; Vitreomacular adhesion of both eyes; Diabetes mellitus without complication (HCC); Nuclear sclerotic cataract of both eyes; Posterior subcapsular age-related cataract, both eyes; Renal lesion; Renal cell carcinoma (HCC); Onychomadesis of toenail; Right arm pain; Severe episode of recurrent major depressive disorder, without psychotic features (HCC); and Hymenoptera allergy on their problem list..   He  has a past medical history of Arthritis, 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 (02/09/2012), GERD (gastroesophageal reflux disease), H/O ulcerative colitis, Hyperlipidemia, Hypertension, ICH (intracerebral hemorrhage) (HCC) (12/11/2020), Inguinal hernia, Nontraumatic subcortical hemorrhage of brain (HCC), Severe sepsis (HCC) (01/17/2020), Thrombocytopenia (HCC) (01/17/2020), Ulcer, Ulcerative colitis, UTI due to extended-spectrum beta lactamase (ESBL) producing Escherichia coli (01/19/2020), and Wears glasses.Matthew Francis   He presents with chief complaint of Medication Problem (Want to discuss epi pen; ) .   Discussed the use of AI scribe software for clinical note transcription with the patient, who gave verbal consent to proceed.  History of Present Illness Matthew Francis Pronounced DIE LAN is a 63 year old male with a history of bee sting allergies who presents for follow-up regarding EpiPen use.  He was stung by a bee one week ago and a yellow jacket a couple of weeks later. He has a history of bee sting allergies and has been using Benadryl  frequently to manage his symptoms. He also applied over-the-counter hydrocortisone  2.5% to the sting sites. After the yellow jacket sting, he  experienced chills despite being in the sun, but did not have swelling in his mouth or difficulty swallowing. He noted itching in the back of his throat and  an increased heart rate but no throat tightness.  He is currently taking quetiapine  for sleep and has reduced his dose from two to one pill because he felt that two pills hit a little too hard sometimes. He is experiencing significant stress and frustration due to personal and social obligations, which is affecting his mood. He is concerned about potentially damaging relationships due to his irritability. He has been trying to manage his responsibilities, including helping friends with various tasks, which is becoming overwhelming.  He has been experiencing financial difficulties, with recent reductions in his food stamp benefits due to his disability payment. He is also dealing with changes in his job situation, which has added to his stress.  His last diabetes check showed a blood sugar level of 95 and an A1c of 6.2. He continues to consume sodas but tries to moderate his intake.     ROS  Past Surgical History:  Procedure Laterality Date   APPENDECTOMY     COLECTOMY     Proctocolectomy with IPAA   COLECTOMY     FOREIGN BODY REMOVAL ABDOMINAL  02/17/2012   Procedure: REMOVAL FOREIGN BODY ABDOMINAL;  Surgeon: Krystal JINNY Russell, MD;  Location: [facility];  Service: General;  Laterality: N/A;  abdominal wound exploration and removal of foreign body   HERNIA REPAIR     Multiple incisional hernias.   ileoanal anastomosis      Outpatient Medications Prior to Visit  Medication Sig Dispense Refill   acetaminophen  (TYLENOL ) 325 MG tablet Take 2 tablets (650 mg total) by mouth every 6 (six) hours as needed for moderate pain or mild pain.     amLODipine  (NORVASC ) 10 MG tablet Take 1 tablet (10 mg total) by mouth daily. 90 tablet 0   ascorbic acid  (VITAMIN C) 500 MG tablet Take 1 tablet (500 mg total) by mouth daily. 30 tablet 0    celecoxib  (CELEBREX ) 50 MG capsule Take 1 capsule (50 mg total) by mouth 2 (two) times daily as needed for pain. 180 capsule 0   cetirizine (ZYRTEC) 10 MG tablet Take 10 mg by mouth daily as needed for allergies.      Cholecalciferol (VITAMIN D3) 1.25 MG (50000 UT) CAPS Take 1 capsule (50,000 Units total) by mouth once a week. 4 capsule 5   ciprofloxacin  (CIPRO ) 500 MG tablet Take 500 mg by mouth.     cloNIDine  (CATAPRES ) 0.1 MG tablet TAKE 1 TABLET(0.1 MG) BY MOUTH THREE TIMES DAILY 90 tablet 11   diclofenac  Sodium (VOLTAREN ) 1 % GEL Apply 4 g topically 4 (four) times daily as needed. 100 g 3   dicyclomine  (BENTYL ) 10 MG capsule Take 10 mg by mouth in the morning, at noon, in the evening, and at bedtime.     ezetimibe  (ZETIA ) 10 MG tablet Take 1 tablet (10 mg total) by mouth daily. TAKE 1 TABLET(10 MG) BY MOUTH DAILY 90 tablet 3   famotidine  (PEPCID ) 40 MG tablet TAKE 1 TABLET(40 MG) BY MOUTH DAILY 90 tablet 0   fluconazole  (DIFLUCAN ) 150 MG tablet Take 1 tablet (150 mg total) by mouth once a week. 12 tablet 1   Glucosamine 500 MG CAPS Take by mouth.     hydrALAZINE  (APRESOLINE ) 100 MG tablet Take 1 tablet (100 mg total) by mouth 3 (three) times daily. 270 tablet 3   Krill Oil (OMEGA-3) 500 MG CAPS Take by mouth.     lisinopril  (ZESTRIL ) 5 MG tablet Take 1 tablet (5 mg total) by mouth daily. 90  tablet 3   loperamide  (IMODIUM ) 2 MG capsule Take 2 capsules (4 mg total) by mouth as needed for diarrhea or loose stools. 30 capsule 0   melatonin 3 MG TABS tablet Take 1 tablet (3 mg total) by mouth at bedtime. 30 tablet 0   methocarbamol  (ROBAXIN ) 500 MG tablet Take 500 mg by mouth.     Multiple Vitamin (MULTIVITAMIN WITH MINERALS) TABS Take 1 tablet by mouth daily.     Omega-3 Fatty Acids (FISH OIL ) 1000 MG CAPS Take by mouth.     pantoprazole  (PROTONIX ) 40 MG tablet TAKE 1 TABLET(40 MG) BY MOUTH DAILY 90 tablet 0   psyllium (METAMUCIL) 58.6 % packet Take 1 packet by mouth daily as needed (For  fiber/thickener).      QUEtiapine  (SEROQUEL ) 25 MG tablet TAKE 2 TABLETS BY MOUTH EVERY NIGHT AT BEDTIME 60 tablet 3   rifaximin  (XIFAXAN ) 550 MG TABS tablet Take 1 tablet (550 mg total) by mouth 2 (two) times daily. 42 tablet 4   Turmeric (QC TUMERIC COMPLEX PO) Take 1 tablet by mouth daily as needed (For inflammatory).     UNABLE TO FIND Med Name: IVERMETIN paste reported by patient OTC     vitamin B-12 (CYANOCOBALAMIN ) 100 MCG tablet Take 1 tablet (100 mcg total) by mouth daily. 30 tablet 0   No facility-administered medications prior to visit.    Family History  Problem Relation Age of Onset   Neurodegenerative disease Mother    Cancer Father        prostate cancer   Cancer Sister    Diabetes Paternal Grandmother    Stroke Paternal Grandfather     Social History   Socioeconomic History   Marital status: Single    Spouse name: Not on file   Number of children: Not on file   Years of education: Not on file   Highest education level: Some college, no degree  Occupational History    Comment: restaurant   Occupation: [employer]  Tobacco Use   Smoking status: Never   Smokeless tobacco: Never  Vaping Use   Vaping status: Never Used  Substance and Sexual Activity   Alcohol use: Yes    Alcohol/week: 21.0 standard drinks of alcohol    Types: 21 Cans of beer per week   Drug use: Not Currently    Types: Cocaine   Sexual activity: Not Currently  Other Topics Concern   Not on file  Social History Narrative   Not on file   Social Drivers of Health   Financial Resource Strain: Medium Risk (11/15/2023)   Overall Financial Resource Strain (CARDIA)    Difficulty of Paying Living Expenses: Somewhat hard  Food Insecurity: No Food Insecurity (11/15/2023)   Hunger Vital Sign    Worried About Running Out of Food in the Last Year: Never true    Ran Out of Food in the Last Year: Never true  Transportation Needs: No Transportation Needs (11/15/2023)   PRAPARE - Doctor, general practice (Medical): No    Lack of Transportation (Non-Medical): No  Physical Activity: Unknown (11/15/2023)   Exercise Vital Sign    Days of Exercise per Week: 0 days    Minutes of Exercise per Session: Not on file  Stress: Stress Concern Present (11/15/2023)   [employer] of Occupational Health - Occupational Stress Questionnaire    Feeling of Stress : Rather much  Social Connections: Moderately Isolated (11/15/2023)   Social Connection and Isolation Panel  Frequency of Communication with Friends and Family: More than three times a week    Frequency of Social Gatherings with Friends and Family: Once a week    Attends Religious Services: 1 to 4 times per year    Active Member of [employer] or Organizations: No    Attends Engineer, structural: Not on file    Marital Status: Never married  Intimate Partner Violence: Not At Risk (11/28/2022)   Received from [facility]   Humiliation, Afraid, Rape, and Kick questionnaire    Within the last year, have you been afraid of your partner or ex-partner?: No    Within the last year, have you been humiliated or emotionally abused in other ways by your partner or ex-partner?: No    Within the last year, have you been kicked, hit, slapped, or otherwise physically hurt by your partner or ex-partner?: No    Within the last year, have you been raped or forced to have any kind of sexual activity by your partner or ex-partner?: No                                                                                                  Objective:  Physical Exam: BP 110/68   Pulse 68   Temp 97.7 F (36.5 C)   Ht 5' 11 (1.803 m)   Wt 210 lb 12.8 oz (95.6 kg)   SpO2 96%   BMI 29.40 kg/m    Physical Exam GENERAL: Alert, cooperative, well developed, no acute distress HEENT: Normocephalic, normal oropharynx, moist mucous membranes CHEST: Clear to auscultation bilaterally, No wheezes, rhonchi, or crackles CARDIOVASCULAR: Normal heart rate  and rhythm, S1 and S2 normal without murmurs ABDOMEN: Soft, non-tender, non-distended, without organomegaly, Normal bowel sounds EXTREMITIES: No cyanosis or edema NEUROLOGICAL: Cranial nerves grossly intact, Moves all extremities without gross motor or sensory deficit     No results found.  No results found for this or any previous visit (from the past 2160 hours).      Beverley Adine Hummer, MD, MS

## 2023-12-15 NOTE — Unmapped (Signed)
 It was medically necessary for me to see the patient in addition to the APP because of the complexity of Tony Andrade case and lack of response to previous therapy.  My visit included a discussion of the clinical  symptoms, a consideration of further testing, and evaluation of treatment options.     Mikle Martinez, MD, PhD   [facility] GASTROENTEROLOGY CONSULTATION VISIT      REFERRING PROVIDER:  Pcp, None Per Patient  [address] [place],  [place] [address]    PRIMARY CARE PROVIDER:  Florie Cohen, MD      PATIENT PROFILE:        Tony Andrade is a 63 y.o. male (DOB: 1961-02-22) who is seen in follow up for evaluation of pouchitis.     CHIEF COMPLAINT: follow-up      HISTORY OF PRESENT ILLNESS: Tony Andrade is a 63 y.o. 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      From a GI perspective, he reports that he was having 8-10 loose bowel movements daily without blood, urgency and minimal cramping. He denies nocturnal awakenings. He is trying to hydrate and has a diminished appetite but stable weight. He continues to take the following to help with pouchitis and frequency: Cipro  BID, Hyoscyamine  0.125 BID, Lomotil  BID to help with frequency in addition to the Skyrizi  every 8 weeks.     Lastly, he denies any drainage from fistula on his left buttock or HS flares.    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     Bowel frequency /24 hours 6-10  Bowel frequency during night > 4  Blood in bowel movements none  Abdominal pain  Mild = 1  Bloating frequent  Incontinence none  Bodyweight stable  Loperamide  Yes 2 tablets  Lomotil  Yes 2 tablets  Hyoscyamine :  No  Antibiotic Other  Cipro        PROBLEMS:  Patient Active Problem List   Diagnosis    Essential hypertension    H/O total colectomy    Pouchitis       Gastroesophageal reflux disease without esophagitis    S/P hernia repair    Foreign body (FB) in soft tissue    Crohn's disease of small intestine with other complication       Renal cell carcinoma of right kidney        REVIEW OF SYSTEMS:     The balance of 12 systems reviewed is negative except as noted in the HPI.     PAST MEDICAL HISTORY:    Past Medical History:   Diagnosis Date    GERD (gastroesophageal reflux disease)     H/O colectomy     Hypertension     Pouchitis       Stroke       Ulcerative colitis          MEDICATIONS:      Current Outpatient Medications:     acetaminophen (TYLENOL) 500 MG tablet, Take 1 tablet (500 mg total) by mouth every six (6) hours as needed for pain., Disp: , Rfl:     activated charcoal, bulk, Powd, by Miscellaneous route daily as needed., Disp: , Rfl:     ascorbic acid, vitamin C, (VITAMIN C) 500 MG tablet, Take 1 tablet (500  mg total) by mouth two (2) times a day., Disp: , Rfl:     cefdinir  (OMNICEF ) 300 MG capsule, Take 1 capsule (300 mg total) by mouth two (2) times a day., Disp: 180 capsule, Rfl: 3    cetirizine (ZYRTEC) 10 MG tablet, Take 1 tablet (10 mg total) by mouth daily., Disp: , Rfl:     cholecalciferol , vitamin D3-1,250 mcg, 50,000 unit,, 1,250 mcg (50,000 unit) capsule, Take 1 capsule (1,250 mcg total) by mouth once a week., Disp: 12 capsule, Rfl: 1    ciprofloxacin  HCl (CIPRO ) 500 MG tablet, TAKE 1 TABLET(500 MG) BY MOUTH TWICE DAILY, Disp: 60 tablet, Rfl: 2    cyanocobalamin 100 MCG tablet, Take 1 tablet (100 mcg total) by mouth daily., Disp: , Rfl:     dicyclomine  (BENTYL ) 10 mg capsule, Take 1 capsule (10 mg total) by mouth four (4) times a day as needed., Disp: 360 capsule, Rfl: 3    diphenoxylate -atropine  (LOMOTIL ) 2.5-0.025 mg per tablet, TAKE 1 TABLET BY MOUTH FOUR TIMES DAILY AS NEEDED FOR DIARRHEA, Disp: 240 tablet, Rfl: 1    empty container Misc, Use as directed, Disp: 1 each, Rfl: 3    ezetimibe (ZETIA) 10 mg tablet, Take 1 tablet (10 mg total) by mouth daily., Disp: , Rfl:     famotidine (PEPCID) 40 MG tablet, Take 1 tablet (40 mg total) by mouth daily as needed for heartburn., Disp: , Rfl:     glucosamine sulfate (GLUCOSAMINE ORAL), Take 1 tablet by mouth daily at 0600., Disp: , Rfl:     hydrALAZINE (APRESOLINE) 50 MG tablet, Take 2 tablets (100 mg total) by mouth two (2) times a day., Disp: , Rfl:     hydrocortisone  (ANUSOL -HC) 2.5 % rectal cream, Insert into the rectum two (2) times a day., Disp: 30 g, Rfl: 2    hyoscyamine  (LEVSIN ) 0.125 mg tablet, Take 1 tablet (0.125 mg total) by mouth every four (4) hours as needed for cramping., Disp: 90 tablet, Rfl: 3    ketoconazole (NIZORAL) 2 % cream, Two (2) times a day., Disp: , Rfl:     Lactobacillus acidophilus (PROBIOTIC ORAL), Take 1 capsule by mouth daily at 0600., Disp: , Rfl:     lisinopril (PRINIVIL,ZESTRIL) 5 MG tablet, Take 1 tablet (5 mg total) by mouth daily., Disp: , Rfl:     loperamide  (IMODIUM ) 2 mg capsule, Take 2 capsules (4 mg total) by mouth four (4) times a day as needed for diarrhea., Disp: 720 capsule, Rfl: 2    melatonin 5 mg cap, Take 10 mg by mouth nightly., Disp: , Rfl:     methocarbamoL (ROBAXIN) 500 MG tablet, Take 1 tablet (500 mg total) by mouth every eight (8) hours as needed., Disp: , Rfl:     multivitamin with minerals tablet, Take 1 tablet by mouth at bedtime., Disp: , Rfl:     OMEGA-3/DHA/EPA/FISH OIL (FISH OIL-OMEGA-3 FATTY ACIDS) 300-1,000 mg capsule, Take 2 capsules (2 g total) by mouth Two (2) times a day., Disp: , Rfl:     pantoprazole (PROTONIX) 40 MG tablet, Take 1 tablet (40 mg total) by mouth daily., Disp: , Rfl:     QUEtiapine (SEROQUEL) 25 MG tablet, Take 1 tablet (25 mg total) by mouth nightly., Disp: , Rfl:     rifAXIMin  (XIFAXAN ) 550 mg Tab, Take 1 tablet (550 mg total) by mouth two (2) times a day., Disp: 28 tablet, Rfl: 0    risankizumab -rzaa (SKYRIZI ) 360 mg/2.4 mL (150 mg/mL) Injt, Inject the  contents of 1 cartridge (360mg ) under the skin every 8 weeks, Disp: 2.4 mL, Rfl: 5 UNABLE TO FIND, Take 1 packet by mouth daily. Electrolyte Stamina  Power Pack, Disp: , Rfl:       ALLERGIES:    Mesalamine, Penicillins, Sulfa (sulfonamide antibiotics), Venom-honey bee, Atorvastatin, and Iodinated contrast media    SOCIAL HISTORY:    Social History[1]      FAMILY HISTORY:    family history includes Other in his mother; Stroke in his father.     VITAL SIGNS:    BP 111/67 (BP Site: L Arm, BP Position: Sitting)  - Pulse 88  - Temp 36.6 ??C (97.8 ??F) (Temporal)  - Ht 180.3 cm (5' 11)  - Wt 95.7 kg (211 lb)  - BMI 29.43 kg/m??       Wt Readings from Last 3 Encounters:   12/17/23 95.7 kg (211 lb)   07/09/23 97.7 kg (215 lb 6.4 oz)   04/30/23 95.6 kg (210 lb 12.8 oz)      Telehealth - NOT DONE    PHYSICAL EXAM:    Constitutional:   Alert, oriented x 3, no acute distress, well nourished, and well hydrated.   Mental Status:   Thought organized, appropriate affect, pleasantly interactive, not anxious appearing.   HEENT:   PEERL, conjunctiva clear, anicteric, oropharynx clear, neck supple, no LAD.   Respiratory: Rales bilateral upper lobes, unlabored breathing, + cough     Cardiac: Euvolemic, regular rate and rhythm, normal S1 and S2, no murmur.     Abdomen: Soft, normal bowel sounds, non-distended, non-tender, no organomegaly or masses.     Perianal/Rectal Exam Not done      Extremities:   No edema, well perfused.   Musculoskeletal: No joint swelling or tenderness noted, no deformities.     Skin: No rashes, jaundice or skin lesions noted. Scar left armpit, history of boils.      Neuro: No focal deficits.            DIAGNOSTIC STUDIES:  I have reviewed all pertinent diagnostic studies, including:    Imaging:    MRI Abdomen 06/2023  Impression: Postablation changes of the right kidney without evidence of locally   recurrent or metastatic disease to the abdomen.     MRI Pelvis 07/2022:  Impression: Posterior perianal abscess in the intersphincteric space measuring up   to 2.1 cm.   This is associated with a left posterolateral, intersphincteric perianal   fistula which also extends inferiorly, terminating at the skin surface of   the medial left gluteal fold where there is a superficial, subcentimeter   fluid collection, likely the fistula drainage site and/or small   subcutaneous abscess.     CT 07/2022  Inflammatory changes are seen adjacent to the rectum. These findings are nonspecific and could represent underlying infectious or inflammatory process. No evidence of fistula or abscess, as clinically questioned.      - The proximal bowel anastomosis measures up to 7.3 cm. There is no upstream bowel dilation. Recommend correlation with symptoms.      - 1.7 cm exophytic right renal mass, which is suspicious for cortical neoplasm. Recommend dedicated MRI or CT (with renal protocol) for further evaluation.     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     Last pouchoscopy     07/2022:  Perianal exam notable for perianal  fistula with                          active purulent drainage.                         - Rectal exam showed NO anal anastomotic stricture.                         - Rectal cuff was normal.                         - Mild inflammation in the pouch body with a several,                          shallow broad ulcers near the pouch inlet and at the                          pouch inlet                         - Neo-terminal ileum was normal for 40+ cm.                         - Ileo-ileal anastomosis was reached with likely                          chronic bowel dilation at the anastomotic site.        08/2018:   The perianal and digital rectal examinations were normal.       The neo-terminal ileum appeared normal. Biopsies were taken with a cold        forceps for histology.       Mild inflammation characterized by erosions was found in the distal        ileoanal pouch. This was graded as Pouchitis Disease Activity Index        (Endoscopic) Score 2. Biopsies were taken with a cold forceps for        histology.       There was a rectal cuff beginning at 1 cm from the anal verge,        characterized by healthy appearing mucosa. The cuff extended 2 cm in        length.    A: Pre-pouch, biopsy  - Small intestinal mucosa with no specific pathologic abnormality     B: Pouch, biopsy  - Small intestinal mucosa with mild Paneth cell hyperplasia and focal mild superficial acute inflammation, negative for dysplasia  - No CMV cytopathic effect or granulomas identified  01/2015: No significant inflammation pouch, prepouch or cuff.     Pathology from his colectomy (2003):   Colon, colectomy  - Moderate chronic active colitis with morphologic features consistent with ulcerative colitis  - Distal margin of resection involved with chronic active colitis  - Acute serositis      Labs    No visits with results within 1 Month(s) from this visit.   Latest known visit with results is:   Telemedicine on 11/11/2023   Component Date Value Ref Range Status    WBC 11/11/2023 5.5  3.4 - 10.8 x10E3/uL Final    RBC 11/11/2023 3.81 (L)  4.14 - 5.80 x10E6/uL Final  HGB 11/11/2023 11.7 (L)  13.0 - 17.7 g/dL Final    HCT 94/71/7974 35.4 (L)  37.5 - 51.0 % Final    MCV 11/11/2023 93  79 - 97 fL Final    MCH 11/11/2023 30.7  26.6 - 33.0 pg Final    MCHC 11/11/2023 33.1  31.5 - 35.7 g/dL Final    RDW 94/71/7974 14.9  11.6 - 15.4 % Final    Platelet 11/11/2023 189  150 - 450 x10E3/uL Final    Neutrophils % 11/11/2023 49  Not Estab. % Final    Lymphocytes % 11/11/2023 32  Not Estab. % Final    Monocytes % 11/11/2023 12  Not Estab. % Final    Eosinophils % 11/11/2023 6  Not Estab. % Final    Basophils % 11/11/2023 1  Not Estab. % Final    Absolute Neutrophils 11/11/2023 2.7  1.4 - 7.0 x10E3/uL Final    Absolute Lymphocytes 11/11/2023 1.8  0.7 - 3.1 x10E3/uL Final    Absolute Monocytes  11/11/2023 0.6  0.1 - 0.9 x10E3/uL Final    Absolute Eosinophils 11/11/2023 0.3  0.0 - 0.4 x10E3/uL Final    Absolute Basophils  11/11/2023 0.0  0.0 - 0.2 x10E3/uL Final    Immature Granulocytes 11/11/2023 0  Not Estab. % Final    Bands Absolute 11/11/2023 0.0  0.0 - 0.1 x10E3/uL Final    Glucose 11/11/2023 95  70 - 99 mg/dL Final    BUN 94/71/7974 14  8 - 27 mg/dL Final    Creatinine 94/71/7974 1.38 (H)  0.76 - 1.27 mg/dL Final    eGFR 94/71/7974 59 (L)  >59 mL/min/1.73 Final    BUN/Creatinine Ratio 11/11/2023 10  10 - 24 Final    Sodium 11/11/2023 141  134 - 144 mmol/L Final    Potassium 11/11/2023 5.6 (H)  3.5 - 5.2 mmol/L Final    Chloride 11/11/2023 107 (H)  96 - 106 mmol/L Final    CO2 11/11/2023 19 (L)  20 - 29 mmol/L Final    Calcium 11/11/2023 10.0  8.6 - 10.2 mg/dL Final    Total Protein 11/11/2023 7.0  6.0 - 8.5 g/dL Final    Albumin 94/71/7974 4.4  3.8 - 4.9 g/dL Final    Globulin, Total 11/11/2023 2.6  1.5 - 4.5 g/dL Final    Total Bilirubin 11/11/2023 0.5  0.0 - 1.2 mg/dL Final    Alkaline Phosphatase 11/11/2023 79  44 - 121 IU/L Final    AST 11/11/2023 25  0 - 40 IU/L Final    ALT 11/11/2023 11  0 - 44 IU/L Final    Magnesium  11/11/2023 2.1  1.6 - 2.3 mg/dL Final    CRP 94/71/7974 3  0 - 10 mg/L Final        ASSESSMENT:        1. 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 Chronic antibiotics-dependent pouchitis. On chronic ciprofloxacin  therapy       Course of Disease:    Initially diagnosed with UC in 1995.    Between 1995 and 2002, he was treated with different 5-ASA compound, azathioprine, and systemic steroids.    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    Fast development of  chronic antibiotic dependent  pouchitis and maintained on ciprofloxacin  500 mg BID, sometimes add on metronidazole  .   07/2014 he underwent hernia repair and while hospitalized developed worsening diarrhea.  Treated  with flagyl  for 14 days along with his chronic Cipro  therapy.  Responded initially but with recurrent symptoms as he completed the course.  Received a second round of flagyl  (3/27-4/5), then went off both Cipro  and flagyl  (ran out the prescription for Cipro ).  This resulted in worsening diarrhea.    10/04/2014 first presentation at the pouch clinic. He was started on Cipro  and flagyl  for 2 weeks, then alternate between the 2 for a week at a time.  Flagyl  no effect later on 01/2015 start alternating doxy and Cipro , attempted initiation of Hyoscyamine    04/2015 Cipro  mono due to no effectiveness of doxy, start of desipramine .   08/2014 his Desipramine  was tapered to 10 mg po daily, which he has tolerated much better. He is having 4-6 BM per day.    05/2017 doing well on the combination of Cipro , hyoscyamine , desipramine  low-dose 10 mg daily and Imodium    08/2018 severe diarrhea, switch to Entocort 9 mg and Cipro    10/2018 reoccurrence of diarrhea while on 6 mg of Entocort, increased bloating, Cipro  stops working on steroid taper   01/2019 desipramine  10 mg to amitriptyline  25 mg, cefdinir  1 tablet twice daily as well as continue the budesonide  9 mg daily, later on switch back to Cipro  twice daily   02/2019 steroid taper   03/2019 start of liraglutide  with decrease of bowel frequency as well as Levaquin .    04/2019 switch back to Cipro , budesonide  9 mg as well as continuation of liraglutide .  01/2020 E. coli sepsis, seizure, new onset of diabetes.   07/2020: Stopped Entocort due to new onset IDDM. Also developed severe joint pains that improved significantly after cutting back on Cipro  to 0.5 tab/day.  Later on trial of liraglutide  which seems to work.    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 But developed significant large joint pains and had to cut back on dose.  - Cefdinir : Responded well for a couple of weeks, then stopped working.  - Doxycycline : No response  - Flagyl : No response  - Bactrim, Augmentin: Not tried to due sulfa and penicillin allergies.  - Tinidazole : Prescribed but could not take due to cost issue.  - Vancomycin : Prescribed recently but could not take due to cost issue  - Xifaxan : responded well     Other medications:  - Entocort: Was on it for a long time, but had to stop due to new onset IDDM  - Using Imodium  prn    Pouch/ perianal abscess/ intersphincteric fistula: Continue Skyrizi  injections every 8 weeks which has helped resolve perianal drainage. He is stable on Cipro  bid. Short course of Xifaxan  tid and then return to Cipro  bid. Will retrial Bentyl  with cramping and he will report if he has the lip swelling side effect. Will continue Hyoscyamine   BID. Continue Lomotil  2 tabs BID as it prevents nocturnal awakenings.    Upper Respiratory Infection: resolved    Hidradenitis suppurative: Since age 85 recurrent boils axilla. Not a problem at this time.    Acute epigastric abdominal pain: Has resolved.    Right papillary renal cell cancer: Cryotherapy in 09/2022, in follow-up     Stroke: Patient suffered hemorrhagic stroke end of June 2022, was hospitalized for 4 weeks with excellent rehabilitation.      Diabetes: Currently not on medication, last hemoglobin A1c 6.0 July 2024.     Labs : no labs needed today; review of May 2025, RBC (3.81), Hgb (11.7) and normal LFTs    Reflux: Currently no problem on PPI.    IBD health maintenance:  Influenza vaccine:  nd  Pneumonia vaccine: Prevnar 20 08/2022  Hepatitis B: pending  TB testing: pending  Chickenpox/Shingles history: 2nd Shingrix today  Bone densitometry:  nd       Recommendations:     Patient Instructions   Continue Skyrizi  injections every 8 weeks  Okay to take Bentyl  when he has pain  Start Zofran  4 mg as needed for nausea; update us  as to how your are doing  Continue safety monitoring every 6 months  Repeat pouch exam not needed at this time  Prevention: will give updated Tdap today; otherwise up to date  Return in 4 months or sooner as needed      Patient seen and discussed with Dr. Lucian.         Brad Pike  IBD Nurse Practitioner    I personally spent 39 minutes face-to-face and non-face-to-face in the care of this patient, which includes all pre, intra, and post visit time on the date of service.  All documented time was specific to the E/M visit and does not include any procedures that may have been performed.         [1]   Social History  Socioeconomic History    Marital status: Single     Spouse name: None    Number of children: None    Years of education: None    Highest education level: None   Tobacco Use    Smoking status: Never     Passive exposure: Never    Smokeless tobacco: Never   Substance and Sexual Activity    Alcohol use: Yes     Alcohol/week: 14.0 standard drinks of alcohol     Types: 14 Cans of beer per week    Drug use: No   Social History Narrative    Incarcerated in [employer].  No job currently.       Social Drivers of Health     Financial Resource Strain: Medium Risk (11/15/2023)    Received from [facility]    Overall Financial Resource Strain (CARDIA)     Difficulty of Paying Living Expenses: Somewhat hard   Food Insecurity: No Food Insecurity (11/15/2023)    Received from [facility]    Hunger Vital Sign     Within the past 12 months, you worried that your food would run out before you got the money to buy more.: Never true     Within the past 12 months, the food you bought just didn't last and you didn't have money to get more.: Never true   Transportation Needs: No Transportation Needs (11/15/2023)    Received from [facility] - Transportation     Lack of Transportation (Medical): No  Lack of Transportation (Non-Medical): No   Physical Activity: Unknown (11/15/2023)    Received from [facility]    Exercise Vital Sign     On average, how many days per week do you engage in moderate to strenuous exercise (like a brisk walk)?: 0 days   Stress: Stress Concern Present (11/15/2023)    Received from [facility] - Occupational Stress Questionnaire     Feeling of Stress : Rather much   Social Connections: Moderately Isolated (11/15/2023)    Received from [facility]    Social Connection and Isolation Panel     In a typical week, how many times do you talk on the phone with family, friends, or neighbors?: More than three times a week     How often do you get together with friends or relatives?: Once a week     How often do you attend church or religious services?: 1 to 4 times per year     Do you belong to any clubs or organizations such as church groups, unions, fraternal or athletic groups, or school groups?: No     Are you married, widowed, divorced, separated, never married, or living with a partner?: Never married   Housing: Low Risk  (08/19/2023)    Received from [facility] Stability Vital Sign     In the last 12 months, was there a time when you were not able to pay the mortgage or rent on time?: No     In the past 12 months, how many times have you moved where you were living?: 1     At any time in the past 12 months, were you homeless or living in a shelter (including now)?: No

## 2023-12-16 LAB — HEPATITIS C ANTIBODY: Hepatitis C Ab: NONREACTIVE

## 2023-12-17 ENCOUNTER — Ambulatory Visit
Admit: 2023-12-17 | Discharge: 2023-12-18 | Payer: PRIVATE HEALTH INSURANCE | Attending: Gastroenterology | Primary: Gastroenterology

## 2023-12-17 ENCOUNTER — Ambulatory Visit: Payer: Self-pay | Admitting: Family Medicine

## 2023-12-17 DIAGNOSIS — K50018 Crohn's disease of small intestine with other complication: Principal | ICD-10-CM

## 2023-12-17 DIAGNOSIS — L732 Hidradenitis suppurativa: Principal | ICD-10-CM

## 2023-12-17 DIAGNOSIS — D84821 Immunosuppression due to drug therapy (HHS-HCC): Principal | ICD-10-CM

## 2023-12-17 DIAGNOSIS — K91850 Pouchitis: Principal | ICD-10-CM

## 2023-12-17 DIAGNOSIS — R110 Nausea: Principal | ICD-10-CM

## 2023-12-17 DIAGNOSIS — Z23 Encounter for immunization: Principal | ICD-10-CM

## 2023-12-17 DIAGNOSIS — Z79899 Other long term (current) drug therapy: Principal | ICD-10-CM

## 2023-12-17 MED ORDER — ONDANSETRON 4 MG DISINTEGRATING TABLET
ORAL_TABLET | Freq: Three times a day (TID) | 0 refills | 30.00000 days | Status: CP | PRN
Start: 2023-12-17 — End: 2024-01-16

## 2023-12-17 NOTE — Unmapped (Addendum)
 Continue Skyrizi  injections every 8 weeks  Okay to take Bentyl  when he has pain  Start Zofran  4 mg as needed for nausea; update us  as to how your are doing  Continue safety monitoring every 6 months  Repeat pouch exam not needed at this time  Prevention: will give updated Tdap today; otherwise up to date  Return in 4 months or sooner as needed

## 2023-12-22 ENCOUNTER — Other Ambulatory Visit: Payer: Self-pay | Admitting: Family Medicine

## 2023-12-22 DIAGNOSIS — K219 Gastro-esophageal reflux disease without esophagitis: Secondary | ICD-10-CM

## 2023-12-28 ENCOUNTER — Other Ambulatory Visit: Payer: Self-pay | Admitting: Family Medicine

## 2023-12-28 DIAGNOSIS — G8929 Other chronic pain: Secondary | ICD-10-CM

## 2024-01-15 ENCOUNTER — Ambulatory Visit: Admitting: Family Medicine

## 2024-01-19 ENCOUNTER — Other Ambulatory Visit: Payer: Self-pay | Admitting: Family Medicine

## 2024-01-19 DIAGNOSIS — G479 Sleep disorder, unspecified: Secondary | ICD-10-CM

## 2024-01-19 DIAGNOSIS — I10 Essential (primary) hypertension: Secondary | ICD-10-CM

## 2024-01-19 NOTE — Unmapped (Signed)
 [facility] Refill Coordination Note    Specialty Medication(s) to be Shipped:   Inflammatory Disorders: Skyrizi     Other medication(s) to be shipped: No additional medications requested for fill at this time     Tony Andrade, DOB: 1960/06/25  Phone: [phone] (home)       All above HIPAA information was verified with patient.     Was a Nurse, learning disability used for this call? No    Completed refill call assessment today to schedule patient's medication shipment from the [facility]  [phone]).  All relevant notes have been reviewed.     Specialty medication(s) and dose(s) confirmed: Regimen is correct and unchanged.   Changes to medications: [name] reports no changes at this time.  Changes to insurance: No  New side effects reported not previously addressed with a pharmacist or physician: None reported  Questions for the pharmacist: No    Confirmed patient received a Conservation officer, historic buildings and a Surveyor, mining with first shipment. The patient will receive a drug information handout for each medication shipped and additional FDA Medication Guides as required.       DISEASE/MEDICATION-SPECIFIC INFORMATION        For patients on injectable medications: Patient currently has 0 doses left.  Next injection is scheduled for Next week.    SPECIALTY MEDICATION ADHERENCE     Medication Adherence    Patient reported X missed doses in the last month: 0  Specialty Medication: SKYRIZI  360 mg/2.4 mL  Patient is on additional specialty medications: No              Were doses missed due to medication being on hold? No    Skyrizi  360/2.4 mg/ml: 0 doses of medicine on hand        REFERRAL TO PHARMACIST     Referral to the pharmacist: Not needed      [facility]     Shipping address confirmed in Epic.     Cost and Payment: Patient has a $0 copay, payment information is not required.    Delivery Scheduled: Yes, Expected medication delivery date: 01/21/24.     Medication will be delivered via UPS to the prescription address in Epic WAM.    [name]   [facility]  Specialty Technician

## 2024-01-20 NOTE — Unmapped (Signed)
 Tony Andrade 's Skyrizi  shipment will be delayed as a result of a high copay. We need patients MFR Debit Card.    I have reached out to the patient  at [phone] and communicated the delay. We will wait for a call back from the patient to reschedule the delivery.  We have not confirmed the new delivery date.

## 2024-01-25 DIAGNOSIS — K50018 Crohn's disease of small intestine with other complication: Principal | ICD-10-CM

## 2024-01-25 MED ORDER — SKYRIZI 360 MG/2.4 ML (150 MG/ML) SUBCUTANEOUS WEARABLE INJECTOR
SUBCUTANEOUS | 5 refills | 0.00000 days | Status: CP
Start: 2024-01-25 — End: ?
  Filled 2024-02-01: qty 2.4, 56d supply, fill #0

## 2024-01-25 NOTE — Unmapped (Signed)
 Patient is 1 week late for Skyrizi  due to copay issue at [facility].  Patient updated that he now has Medicaid coverage that should resolve cost.  [facility] SP notified to expedite reprocessing under Medicaid coverage.

## 2024-01-27 ENCOUNTER — Ambulatory Visit: Admitting: Family Medicine

## 2024-01-27 ENCOUNTER — Ambulatory Visit (INDEPENDENT_AMBULATORY_CARE_PROVIDER_SITE_OTHER): Admitting: Family Medicine

## 2024-01-27 DIAGNOSIS — G479 Sleep disorder, unspecified: Secondary | ICD-10-CM | POA: Diagnosis not present

## 2024-01-27 DIAGNOSIS — E118 Type 2 diabetes mellitus with unspecified complications: Secondary | ICD-10-CM

## 2024-01-27 DIAGNOSIS — L608 Other nail disorders: Secondary | ICD-10-CM

## 2024-01-27 DIAGNOSIS — F332 Major depressive disorder, recurrent severe without psychotic features: Secondary | ICD-10-CM

## 2024-01-27 DIAGNOSIS — I10 Essential (primary) hypertension: Secondary | ICD-10-CM

## 2024-01-27 DIAGNOSIS — K5120 Ulcerative (chronic) proctitis without complications: Secondary | ICD-10-CM

## 2024-01-27 LAB — POCT GLYCOSYLATED HEMOGLOBIN (HGB A1C): Hemoglobin A1C: 5.9 % — AB (ref 4.0–5.6)

## 2024-01-27 MED ORDER — QUETIAPINE FUMARATE 100 MG PO TABS
100.0000 mg | ORAL_TABLET | Freq: Every day | ORAL | 0 refills | Status: DC
Start: 2024-01-27 — End: 2024-04-28

## 2024-01-27 MED ORDER — ESCITALOPRAM OXALATE 20 MG PO TABS: 20.0000 mg | ORAL_TABLET | Freq: Every day | ORAL | 0 refills | Status: DC

## 2024-01-27 MED ORDER — FLUCONAZOLE 150 MG PO TABS: 150.0000 mg | ORAL_TABLET | ORAL | 1 refills | Status: DC

## 2024-01-27 NOTE — Patient Instructions (Signed)
  VISIT SUMMARY: You came in today for a follow-up on your mood and diabetes management. We discussed your diabetes, ulcerative colitis, mood disorders, hypertension, and onychomycosis. We also reviewed your current medications and made some adjustments to better manage your conditions.  YOUR PLAN: -DIABETES MELLITUS SECONDARY TO CHRONIC STEROID USE: Your diabetes, which is caused by long-term steroid use for ulcerative colitis, is well-managed with lifestyle changes, diet, and exercise. Your current A1c level is 5.9, which is within the target range. Continue with your current management plan.  -DIFFICULT TO CONTROL ULCERATIVE COLITIS: Ulcerative colitis is a chronic condition that causes inflammation in your digestive tract. You are currently experiencing increased bowel movements due to a delay in receiving your Skyrizi medication. We will continue working with your gastroenterologist to get approval for [employer].  -MAJOR DEPRESSIVE DISORDER AND ANXIETY DISORDER: Your mood disorders, including depression and anxiety, are being managed with escitalopram  and quetiapine . We have increased your escitalopram  to 20 mg daily and quetiapine  to 100 mg at night to help improve your mood and sleep. Please follow up with behavioral health on Monday at 9 AM.  -ESSENTIAL HYPERTENSION: Your high blood pressure is well-controlled with your current medications, and your recent reading was 126/78 mmHg. Continue taking hydralazine , lisinopril , clonidine , and amlodipine  as prescribed.  -ONYCHOMYCOSIS (TOENAIL FUNGUS): Your toenail fungus is improving with fluconazole  treatment. Continue taking fluconazole  150 mg once weekly for 12 weeks.  INSTRUCTIONS: Please follow up with behavioral health on Monday at 9 AM. Continue working with your gastroenterologist for [employer]. Maintain your current diabetes management plan and hypertension medications. Continue fluconazole  treatment for onychomycosis.

## 2024-01-27 NOTE — Progress Notes (Signed)
 Assessment & Plan   Assessment/Plan:    Problem List Items Addressed This Visit       Cardiovascular and Mediastinum   Essential hypertension     Digestive   Ulcerative colitis (HCC)     Endocrine   Controlled type 2 diabetes mellitus with complication, without long-term current use of insulin  (HCC)   Relevant Orders   POCT glycosylated hemoglobin (Hb A1C) (Completed)     Musculoskeletal and Integument   Onychomadesis of toenail   Relevant Medications   fluconazole  (DIFLUCAN ) 150 MG tablet     Other   Sleep disorder   Relevant Medications   QUEtiapine  (SEROQUEL ) 100 MG tablet   Severe episode of recurrent major depressive disorder, without psychotic features (HCC) - Primary   Relevant Medications   escitalopram  (LEXAPRO ) 20 MG tablet   QUEtiapine  (SEROQUEL ) 100 MG tablet        Assessment and Plan Assessment & Plan Diabetes mellitus secondary to chronic steroid use Diabetes is well-managed with lifestyle modifications, diet, and exercise. Current A1c is 5.9, which is within the target range.  Difficult to control ulcerative colitis Ulcerative colitis is difficult to control and currently managed with Skyrizi. There is a delay in receiving Skyrizi due to insurance issues, leading to increased bowel movements (15 times yesterday). - Continue working with GI for [employer] approval  Major depressive disorder and anxiety disorder Major depressive disorder and anxiety disorder are being managed with escitalopram  and quetiapine . Escitalopram  has shown some improvement in mood, but there is a decrease in energy and mood when not taken consistently. Quetiapine  is helping with sleep but may need a dosage adjustment for better efficacy. - Increase escitalopram  to 20 mg daily - Increase quetiapine  to 100 mg at night - Follow up with behavioral health on Monday at 9 AM  Essential hypertension Hypertension is well-controlled with a blood pressure reading of 126/78 mmHg.  Current medications include hydralazine , lisinopril , clonidine , and amlodipine .  Onychomycosis (toenail fungus) Onychomycosis is improving with fluconazole  treatment. He has been using terbinafine  in the interim due to running out of fluconazole . - Continue fluconazole  150 mg once weekly for 12 weeks      Medications Discontinued During This Encounter  Medication Reason   fluconazole  (DIFLUCAN ) 150 MG tablet Reorder   escitalopram  (LEXAPRO ) 10 MG tablet    QUEtiapine  (SEROQUEL ) 25 MG tablet     Return in about 3 months (around 04/28/2024) for diabetes, blood pressure, onychomycosis.        Subjective:   Encounter date: 01/27/2024  Matthew Francis is a 63 y.o. male who has Essential hypertension; Ulcerative colitis (HCC); Sleep disorder; Hyperlipidemia; History of hemorrhagic cerebrovascular accident (CVA) with residual deficit; Muscle spasm; Gastroesophageal reflux disease; Acute right hip pain; Controlled type 2 diabetes mellitus with complication, without long-term current use of insulin  (HCC); Cocaine abuse (HCC); Alcohol abuse; Recurrent major depressive disorder, in remission (HCC); Anxiety; Vitamin D  deficiency; B12 deficiency; Anemia; Lateral epicondylitis of left elbow; Ulnar neuropathy at elbow of right upper extremity; Vitreomacular adhesion of both eyes; Diabetes mellitus without complication (HCC); Nuclear sclerotic cataract of both eyes; Posterior subcapsular age-related cataract, both eyes; Renal lesion; Renal cell carcinoma (HCC); Onychomadesis of toenail; Right arm pain; Severe episode of recurrent major depressive disorder, without psychotic features (HCC); and Hymenoptera allergy on their problem list..   He  has a past medical history of Arthritis, 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 (02/09/2012), GERD (gastroesophageal reflux disease), H/O ulcerative colitis, Hyperlipidemia, Hypertension, ICH (intracerebral hemorrhage) (HCC) (  12/11/2020), Inguinal hernia,  Nontraumatic subcortical hemorrhage of brain (HCC), Severe sepsis (HCC) (01/17/2020), Thrombocytopenia (HCC) (01/17/2020), Ulcer, Ulcerative colitis, UTI due to extended-spectrum beta lactamase (ESBL) producing Escherichia coli (01/19/2020), and Wears glasses.SABRA   He presents with chief complaint of Depression (1 month follow up. Pt is not fasting today. //HM due- diabetic eye and foot exam, A1c ) .   Discussed the use of AI scribe software for clinical note transcription with the patient, who gave verbal consent to proceed.  History of Present Illness Matthew Francis Pronounced DIE LAN is a 63 year old male with diabetes and ulcerative colitis who presents for follow-up on mood and diabetes management.  He has diabetes induced by chronic steroid use for ulcerative colitis, managed with lifestyle modifications, diet, and exercise. His most recent A1c is 5.9, slightly higher than his previous result of 5.2, but still within a good range. Diabetes management is ongoing.  He has a history of ulcerative colitis and was previously on Skyrizi. Due to a change in insurance, he is currently a week late on his Skyrizi medication. He experiences frequent bowel movements, up to fifteen times a day, and is trying to stay hydrated by drinking water regularly.  He is being followed for mood disorders, including anxiety and depression. He is taking quetiapine  25 mg twice daily and escitalopram  10 mg daily. He reports improvement in mood and energy levels when taking escitalopram  consistently, describing a day when he felt 'the fog was clear' and had the energy to exercise. However, he notes a decline in mood when he misses doses. His sleep has been inconsistent, with recent improvement when taking quetiapine  at night.  He has hypertension, well controlled with hydralazine  100 mg TID, lisinopril  5 mg daily, clonidine  0.1 mg TID, and amlodipine  10 mg daily.  He mentions a past issue with a bump on his back, which he  self-treated by expressing a blackhead-like substance. The bump is no longer painful, has not produced any discharge recently, and appears to have healed. No current pain or drainage from the bump.  He is dealing with onychomycosis, which has shown improvement with fluconazole  treatment. He has been taking terbinafine  recently due to running out of fluconazole , and he notes a positive response to the fluconazole  treatment in the past.  He mentions financial difficulties due to changes in his insurance coverage and is trying to borrow money from his sister. He also discusses a long-distance relationship with a girlfriend in the [place] whom he plans to visit.     11/19/2023    9:19 AM 09/24/2023    3:56 PM 05/13/2022   11:53 AM 02/24/2022    2:24 PM 01/29/2022   11:14 AM  Depression screen PHQ 2/9  Decreased Interest 3 3 1 2 1   Down, Depressed, Hopeless 2 2 1 1 1   PHQ - 2 Score 5 5 2 3 2   Altered sleeping 3 3 2 2    Tired, decreased energy 3 3 2 3    Change in appetite 3 3 1 2    Feeling bad or failure about yourself  2 2 2 2    Trouble concentrating 2 2 1 2    Moving slowly or fidgety/restless 1 1 1 2    Suicidal thoughts 0 1 0 1   PHQ-9 Score 19 20 11 17    Difficult doing work/chores Somewhat difficult Somewhat difficult Somewhat difficult Not difficult at all       09/24/2023    3:56 PM 05/13/2022   11:53 AM 02/24/2022  2:25 PM 11/21/2021    3:11 PM  GAD 7 : Generalized Anxiety Score  Nervous, Anxious, on Edge 2 1 2 2   Control/stop worrying 3 1 2 3   Worry too much - different things 3 2 2 3   Trouble relaxing 3 2 2 3   Restless 1 1 1 2   Easily annoyed or irritable 2 2 2 3   Afraid - awful might happen 2 2 2 3   Total GAD 7 Score 16 11 13 19   Anxiety Difficulty Somewhat difficult Somewhat difficult Somewhat difficult         ROS  Past Surgical History:  Procedure Laterality Date   APPENDECTOMY     COLECTOMY     Proctocolectomy with IPAA   COLECTOMY     FOREIGN BODY  REMOVAL ABDOMINAL  02/17/2012   Procedure: REMOVAL FOREIGN BODY ABDOMINAL;  Surgeon: Krystal JINNY Russell, MD;  Location: [facility];  Service: General;  Laterality: N/A;  abdominal wound exploration and removal of foreign body   HERNIA REPAIR     Multiple incisional hernias.   ileoanal anastomosis      Outpatient Medications Prior to Visit  Medication Sig Dispense Refill   acetaminophen  (TYLENOL ) 325 MG tablet Take 2 tablets (650 mg total) by mouth every 6 (six) hours as needed for moderate pain or mild pain.     amLODipine  (NORVASC ) 10 MG tablet TAKE 1 TABLET(10 MG) BY MOUTH DAILY 90 tablet 0   ascorbic acid  (VITAMIN C) 500 MG tablet Take 1 tablet (500 mg total) by mouth daily. 30 tablet 0   celecoxib  (CELEBREX ) 50 MG capsule TAKE 1 CAPSULE(50 MG) BY MOUTH TWICE DAILY AS NEEDED FOR PAIN 180 capsule 0   cetirizine (ZYRTEC) 10 MG tablet Take 10 mg by mouth daily as needed for allergies.      Cholecalciferol (VITAMIN D3) 1.25 MG (50000 UT) CAPS Take 1 capsule (50,000 Units total) by mouth once a week. 4 capsule 5   ciprofloxacin  (CIPRO ) 500 MG tablet Take 500 mg by mouth.     cloNIDine  (CATAPRES ) 0.1 MG tablet TAKE 1 TABLET(0.1 MG) BY MOUTH THREE TIMES DAILY 90 tablet 11   diclofenac  Sodium (VOLTAREN ) 1 % GEL Apply 4 g topically 4 (four) times daily as needed. 100 g 3   dicyclomine  (BENTYL ) 10 MG capsule Take 10 mg by mouth in the morning, at noon, in the evening, and at bedtime.     EPINEPHrine  (EPIPEN  2-PAK) 0.3 mg/0.3 mL IJ SOAJ injection Inject 0.3 mg into the muscle as needed for anaphylaxis. 1 each 1   ezetimibe  (ZETIA ) 10 MG tablet Take 1 tablet (10 mg total) by mouth daily. TAKE 1 TABLET(10 MG) BY MOUTH DAILY 90 tablet 3   famotidine  (PEPCID ) 40 MG tablet TAKE 1 TABLET(40 MG) BY MOUTH DAILY 90 tablet 0   Glucosamine 500 MG CAPS Take by mouth.     hydrALAZINE  (APRESOLINE ) 100 MG tablet Take 1 tablet (100 mg total) by mouth 3 (three) times daily. 270 tablet 3   hyoscyamine  (LEVSIN) 0.125 MG tablet Take by mouth.     Krill Oil (OMEGA-3) 500 MG CAPS Take by mouth.     lisinopril  (ZESTRIL ) 5 MG tablet Take 1 tablet (5 mg total) by mouth daily. 90 tablet 3   loperamide  (IMODIUM ) 2 MG capsule Take 2 capsules (4 mg total) by mouth as needed for diarrhea or loose stools. 30 capsule 0   melatonin 3 MG TABS tablet Take 1 tablet (3 mg total) by mouth at  bedtime. 30 tablet 0   methocarbamol  (ROBAXIN ) 500 MG tablet Take 500 mg by mouth.     Multiple Vitamin (MULTIVITAMIN WITH MINERALS) TABS Take 1 tablet by mouth daily.     Omega-3 Fatty Acids (FISH OIL ) 1000 MG CAPS Take by mouth.     ondansetron  (ZOFRAN -ODT) 4 MG disintegrating tablet Take 4 mg by mouth every 8 (eight) hours as needed.     pantoprazole  (PROTONIX ) 40 MG tablet TAKE 1 TABLET(40 MG) BY MOUTH DAILY 90 tablet 0   psyllium (METAMUCIL) 58.6 % packet Take 1 packet by mouth daily as needed (For fiber/thickener).      rifaximin  (XIFAXAN ) 550 MG TABS tablet Take 1 tablet (550 mg total) by mouth 2 (two) times daily. 42 tablet 4   SKYRIZI 360 MG/2.4ML SOCT Inject the contents of 1 cartridge (360mg ) under the skin every 8 weeks     Turmeric (QC TUMERIC COMPLEX PO) Take 1 tablet by mouth daily as needed (For inflammatory).     UNABLE TO FIND Med Name: IVERMETIN paste reported by patient OTC     vitamin B-12 (CYANOCOBALAMIN ) 100 MCG tablet Take 1 tablet (100 mcg total) by mouth daily. 30 tablet 0   escitalopram  (LEXAPRO ) 10 MG tablet Take 1 tablet (10 mg total) by mouth daily. 30 tablet 3   fluconazole  (DIFLUCAN ) 150 MG tablet Take 1 tablet (150 mg total) by mouth once a week. 12 tablet 1   QUEtiapine  (SEROQUEL ) 25 MG tablet TAKE 1 TABLET(25 MG) BY MOUTH TWICE DAILY 180 tablet 2   No facility-administered medications prior to visit.    Family History  Problem Relation Age of Onset   Neurodegenerative disease Mother    Cancer Father        prostate cancer   Cancer Sister    Diabetes Paternal Grandmother    Stroke  Paternal Grandfather     Social History   Socioeconomic History   Marital status: Single    Spouse name: Not on file   Number of children: Not on file   Years of education: Not on file   Highest education level: Some college, no degree  Occupational History    Comment: restaurant   Occupation: [employer]  Tobacco Use   Smoking status: Never   Smokeless tobacco: Never  Vaping Use   Vaping status: Never Used  Substance and Sexual Activity   Alcohol use: Yes    Alcohol/week: 21.0 standard drinks of alcohol    Types: 21 Cans of beer per week   Drug use: Not Currently    Types: Cocaine   Sexual activity: Not Currently  Other Topics Concern   Not on file  Social History Narrative   Not on file   Social Drivers of Health   Financial Resource Strain: Medium Risk (11/15/2023)   Overall Financial Resource Strain (CARDIA)    Difficulty of Paying Living Expenses: Somewhat hard  Food Insecurity: No Food Insecurity (11/15/2023)   Hunger Vital Sign    Worried About Running Out of Food in the Last Year: Never true    Ran Out of Food in the Last Year: Never true  Transportation Needs: No Transportation Needs (11/15/2023)   PRAPARE - Administrator, Civil Service (Medical): No    Lack of Transportation (Non-Medical): No  Physical Activity: Unknown (11/15/2023)   Exercise Vital Sign    Days of Exercise per Week: 0 days    Minutes of Exercise per Session: Not on file  Stress: Stress Concern Present (  11/15/2023)   [employer] [employer] - Occupational Stress Questionnaire    Feeling of Stress : Rather much  Social Connections: Moderately Isolated (11/15/2023)   Social Connection and Isolation Panel    Frequency of Communication with Friends and Family: More than three times a week    Frequency of Social Gatherings with Friends and Family: Once a week    Attends Religious Services: 1 to 4 times per year    Active Member of [employer] or Organizations: No    Attends  Engineer, structural: Not on file    Marital Status: Never married  Intimate Partner Violence: Not At Risk (11/28/2022)   Received from [facility]   Humiliation, Afraid, Rape, and Kick questionnaire    Within the last year, have you been afraid of your partner or ex-partner?: No    Within the last year, have you been humiliated or emotionally abused in other ways by your partner or ex-partner?: No    Within the last year, have you been kicked, hit, slapped, or otherwise physically hurt by your partner or ex-partner?: No    Within the last year, have you been raped or forced to have any kind of sexual activity by your partner or ex-partner?: No                                                                                                  Objective:  Physical Exam: There were no vitals taken for this visit.   Physical Exam VITALS: BP- 126/78 GENERAL: Alert, cooperative, well developed, no acute distress. HEENT: Normocephalic, normal oropharynx, moist mucous membranes. CHEST: Clear to auscultation bilaterally, no wheezes, rhonchi, or crackles. CARDIOVASCULAR: Normal heart rate and rhythm, S1 and S2 normal without murmurs. ABDOMEN: Soft, non-tender, non-distended, without organomegaly, normal bowel sounds. EXTREMITIES: No cyanosis or edema. Feet without skin breakdown. NEUROLOGICAL: Cranial nerves grossly intact, moves all extremities without gross motor or sensory deficit. SKIN: Healed area on back, no drainage or odor.   Diabetic foot exam was performed with the following findings:   No deformities, ulcerations, or other skin breakdown Normal sensation of 10g monofilament Intact posterior tibialis and dorsalis pedis pulses Improved onychomycosis on all nails, but still present, less yellow and thick     Physical Exam  No results found.  Recent Results (from the past 2160 hours)  Urine Albumin/Creatinine with ratio (send out) [[id number]]     Status: None    Collection Time: 12/15/23 11:07 AM  Result Value Ref Range   Microalb, Ur 1.2 0.0 - 1.9 mg/dL   Creatinine,U 857.5 mg/dL   Microalb Creat Ratio 8.2 0.0 - 30.0 mg/g  Comprehensive metabolic panel with GFR     Status: Abnormal   Collection Time: 12/15/23 11:07 AM  Result Value Ref Range   Sodium 140 135 - 145 mEq/L   Potassium 4.0 3.5 - 5.1 mEq/L   Chloride 110 96 - 112 mEq/L   CO2 20 19 - 32 mEq/L   Glucose, Bld 96 70 - 99 mg/dL   BUN 19 6 - 23 mg/dL   Creatinine,  Ser 1.42 0.40 - 1.50 mg/dL   Total Bilirubin 0.5 0.2 - 1.2 mg/dL   Alkaline Phosphatase 56 39 - 117 U/L   AST 20 0 - 37 U/L   ALT 13 0 - 53 U/L   Total Protein 7.4 6.0 - 8.3 g/dL   Albumin 4.4 3.5 - 5.2 g/dL   GFR 46.34 (L) >39.99 mL/min    Comment: Calculated using the CKD-EPI Creatinine Equation (2021)   Calcium  9.7 8.4 - 10.5 mg/dL  Hepatitis C antibody     Status: None   Collection Time: 12/15/23 11:07 AM  Result Value Ref Range   Hepatitis C Ab NON-REACTIVE NON-REACTIVE    Comment: . HCV antibody was non-reactive. There is no laboratory  evidence of HCV infection. . In most cases, no further action is required. However, if recent HCV exposure is suspected, a test for HCV RNA (test code 64354) is suggested. . For additional information please refer to http://education.questdiagnostics.com/faq/FAQ22v1 (This link is being provided for informational/ educational purposes only.) .   POCT glycosylated hemoglobin (Hb A1C)     Status: Abnormal   Collection Time: 01/27/24  2:46 PM  Result Value Ref Range   Hemoglobin A1C 5.9 (A) 4.0 - 5.6 %   HbA1c POC (<> result, manual entry)     HbA1c, POC (prediabetic range)     HbA1c, POC (controlled diabetic range)          Beverley Adine Hummer, MD, MS

## 2024-01-28 NOTE — Unmapped (Addendum)
 Medicaid has denied Skyrizi  due to preferred adalimumab and infliximab therapy.  Appeal will be submitted asap by [employer] appeal RN.  Letter drafted.  Submitted denial information to [employer] and submitted MD portion of [employer] PAP.  Patient sent patient portion to complete in portal as back up plan for procurement of medication due to historically slow/lack of approval from medicaid without adalimumab failure.

## 2024-01-31 NOTE — Progress Notes (Deleted)
 Psychiatric Initial Adult Assessment  Patient Identification: Matthew Francis MRN:  [id number] Date of Evaluation:  01/31/2024 Referral Source: Beverley KATHEE Hummer   Assessment:  Matthew Francis is a 63 y.o. male with a history of *** who presents in person to [facility] for initial evaluation of depression and anxiety. His PPHx is significant for recurrent MDD, anxiety, sleep disorder, cocaine abuse and alcohol abuse. He has multiple medical comorbidities which include HTN, Crohns disease, UC, HLD, GERD, T2DM controlled, CKD, hemorrhagic CVA w/ residual deficit, Vit D deiciency, Vit B12 deficiency, vitreomacular adhesion of both eyes, s/p hernia repair. Per referral request from PCP, who was managing the patient's psychotropic medications (lexapro  20 mg every day and seroquel  100 at bedtime) ***  The patient's presentation is most consistent with a principal diagnosis of ***, as evidenced by ***.   Relevant Chart Review Notes/Encounters: *** Labs/Imaging: ***   Risk Assessment: A suicide and violence risk assessment was performed as part of this evaluation. There patient is deemed to be at chronic elevated risk for self-harm/suicide given the following factors: {SABSUICIDERISKFACTORS:29780}. These risk factors are mitigated by the following factors: {SABSUICIDEPROTECTIVEFACTORS:29779}. The patient is deemed to be at chronic elevated risk for violence given the following factors: {SABVIOLENCERISKFACTORS:29781}. These risk factors are mitigated by the following factors: {SABVIOLENCEPROTECTIVEFACTORS:29782}. There is no *** acute risk for suicide or violence at this time. The patient was educated about relevant modifiable risk factors including following recommendations for treatment of psychiatric illness and abstaining from substance abuse.  While future psychiatric events cannot be accurately predicted, the patient does not *** currently require  acute inpatient psychiatric care  and does not *** currently meet [place]  involuntary commitment criteria.    Plan:  # *** Past medication trials:  Status of problem: new to me Interventions: -- ***  # *** Past medication trials:  Status of problem: new to me Interventions: -- ***  # *** Past medication trials:  Status of problem: new to me Interventions: -- ***  Health Maintenance PCP: Hummer Beverley KATHEE, MD @ No data recorded  Patient was given contact information for behavioral health clinic and was instructed to call 911 for emergencies.  Patient and plan of care will be discussed with the Attending MD ,Dr. ***, who agrees with the above statement and plan.   Subjective:  Chief Complaint: No chief complaint on file.   History of Present Illness:   *** The patient is here for ***. The patient reports a *** history of ***.  The patient's stressors include ***.   Sleep: *** Snoring: *** Caffeine : ***   Psychiatric ROS Depression: *** The patient denied any current symptoms of depression/***The patient reports a *** history of low mood and anhedonia, characterized by {Depression:32915}. Suicidal thoughts are ***. The patient denies any symptoms of ***.   Anxiety: *** The patient denied any current symptoms of anxiety/ ***The patient reports a *** history of excessive anxiety and worry about a number of events, characterized by {HJI:67083}.   Panic attacks: *** The patient denies any current or past history of panic attacks/*** The patient reports experiencing of intense surge of fear, in which the following symptoms develop: {Panic Attacks:32917}  Mania/Hypomania: *** Negative for any past or current symptoms of expansive energy or mood/ The patient reports a *** history of episodes of expansive energy and mood that can last up to *** days characterized by {MANIA:32922}    Last episode reported by patient: ***   PSTD: ***  Negative for any history of trauma / ***The patient reports past  exposure to ***.  Intrusions s/xs: ***  Hyperarousal s/xs: ***  Avoidance s/xs: ***  Negative effects on cognition and mood: ***  Eating Disorder: The patient denies any current or past history of disordered eating that includes restrictive, binging, purging behaviors, or any other compensatory behaviors / ***   IED: ***/ The patient reports a ** history of impulsive aggression characterized by {PZI:67078}  Psychosis: *** The patient denies any history of hallucinations, disorganized thinking, paranoia, or delusions. / ***    Safety: Active SI: ***Denied Passive SI: *** Access to firearms: *** Psychosis: as above  ROS   Past Psychiatric Hx:  Diagnoses: *** Medication trials: Cymabalta *** Previous psychiatrist/therapist: *** Hospitalizations: ***None identified on chart review Suicide attempts: *** NSSIB Hx: *** Hx of violence towards others: *** Hx of trauma/abuse: ***  Substance Abuse Hx: Alcohol:  Hx of withdrawal seizures/Dts: *** Tobacco: *** Cannabis: *** Other Illicit Substance Use: IVDU: denied Detox Hx: *** Rehab Hx: ***  Past Medical Hx: PCP: Sebastian Beverley NOVAK, MD  Dx: *** ALL: *** Asacol [mesalamine], Penicillins, Sulfa antibiotics, Atorvastatin, Bee pollen, Bee venom, and Iodinated contrast media  Head trauma: *** Seizures: ***  Family Medical Hx: Father-stroke, glaucoma,  Cancer Sister Diabetes Paternal Grandmother Stroke Paternal Grandfather   Family Psychiatric Hx: *** Psychiatric disorders: *** Suicide hx: *** Homicide: ***  Social History: Living situation: *** Occupational status: *** Educational history: *** Marital Status: *** Children: *** Legal Hx: *** DUI/DWI: *** Military Hx: ***  Past Medical History:  Past Medical History:  Diagnosis Date   Arthritis    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 02/09/2012   GERD (gastroesophageal reflux disease)    H/O ulcerative colitis    Hyperlipidemia    Hypertension     ICH (intracerebral hemorrhage) (HCC) 12/11/2020   Inguinal hernia    Nontraumatic subcortical hemorrhage of brain (HCC)    Severe sepsis (HCC) 01/17/2020   Thrombocytopenia (HCC) 01/17/2020   Ulcer    ulcerative colitis   Ulcerative colitis    UTI due to extended-spectrum beta lactamase (ESBL) producing Escherichia coli 01/19/2020   Wears glasses     Past Surgical History:  Procedure Laterality Date   APPENDECTOMY     COLECTOMY     Proctocolectomy with IPAA   COLECTOMY     FOREIGN BODY REMOVAL ABDOMINAL  02/17/2012   Procedure: REMOVAL FOREIGN BODY ABDOMINAL;  Surgeon: Krystal JINNY Russell, MD;  Location: [facility];  Service: General;  Laterality: N/A;  abdominal wound exploration and removal of foreign body   HERNIA REPAIR     Multiple incisional hernias.   ileoanal anastomosis      Family History:  Family History  Problem Relation Age of Onset   Neurodegenerative disease Mother    Cancer Father        prostate cancer   Cancer Sister    Diabetes Paternal Grandmother    Stroke Paternal Grandfather     Social History:   Social History   Socioeconomic History   Marital status: Single    Spouse name: Not on file   Number of children: Not on file   Years of education: Not on file   Highest education level: Some college, no degree  Occupational History    Comment: restaurant   Occupation: [employer]  Tobacco Use   Smoking status: Never   Smokeless tobacco: Never  Vaping Use   Vaping status:  Never Used  Substance and Sexual Activity   Alcohol use: Yes    Alcohol/week: 21.0 standard drinks of alcohol    Types: 21 Cans of beer per week   Drug use: Not Currently    Types: Cocaine   Sexual activity: Not Currently  Other Topics Concern   Not on file  Social History Narrative   Not on file   Social Drivers of Health   Financial Resource Strain: Medium Risk (11/15/2023)   Overall Financial Resource Strain (CARDIA)    Difficulty of Paying Living Expenses:  Somewhat hard  Food Insecurity: No Food Insecurity (11/15/2023)   Hunger Vital Sign    Worried About Running Out of Food in the Last Year: Never true    Ran Out of Food in the Last Year: Never true  Transportation Needs: No Transportation Needs (11/15/2023)   PRAPARE - Administrator, Civil Service (Medical): No    Lack of Transportation (Non-Medical): No  Physical Activity: Unknown (11/15/2023)   Exercise Vital Sign    Days of Exercise per Week: 0 days    Minutes of Exercise per Session: Not on file  Stress: Stress Concern Present (11/15/2023)   [employer] of Occupational Health - Occupational Stress Questionnaire    Feeling of Stress : Rather much  Social Connections: Moderately Isolated (11/15/2023)   Social Connection and Isolation Panel    Frequency of Communication with Friends and Family: More than three times a week    Frequency of Social Gatherings with Friends and Family: Once a week    Attends Religious Services: 1 to 4 times per year    Active Member of [employer] or Organizations: No    Attends Engineer, structural: Not on file    Marital Status: Never married    Allergies:   Allergies  Allergen Reactions   Asacol [Mesalamine] Swelling    Throat    Penicillins Anaphylaxis    Has patient had a PCN reaction causing immediate rash, facial/tongue/throat swelling, SOB or lightheadedness with hypotension: Yes Has patient had a PCN reaction causing severe rash involving mucus membranes or skin necrosis: No Has patient had a PCN reaction that required hospitalization: Yes Has patient had a PCN reaction occurring within the last 10 years: No If all of the above answers are NO, then may proceed with Cephalosporin use.   Sulfa Antibiotics Anaphylaxis   Atorvastatin Other (See Comments)    Myalgia, muscle cramps   Bee Pollen Hives   Bee Venom Hives and Swelling   Iodinated Contrast Media     Pt reports allergy at [place]. Rapid heartbeat and dyspnea     Current Medications: Current Outpatient Medications  Medication Sig Dispense Refill   acetaminophen  (TYLENOL ) 325 MG tablet Take 2 tablets (650 mg total) by mouth every 6 (six) hours as needed for moderate pain or mild pain.     amLODipine  (NORVASC ) 10 MG tablet TAKE 1 TABLET(10 MG) BY MOUTH DAILY 90 tablet 0   ascorbic acid  (VITAMIN C) 500 MG tablet Take 1 tablet (500 mg total) by mouth daily. 30 tablet 0   celecoxib  (CELEBREX ) 50 MG capsule TAKE 1 CAPSULE(50 MG) BY MOUTH TWICE DAILY AS NEEDED FOR PAIN 180 capsule 0   cetirizine (ZYRTEC) 10 MG tablet Take 10 mg by mouth daily as needed for allergies.      Cholecalciferol (VITAMIN D3) 1.25 MG (50000 UT) CAPS Take 1 capsule (50,000 Units total) by mouth once a week. 4 capsule 5  ciprofloxacin  (CIPRO ) 500 MG tablet Take 500 mg by mouth.     cloNIDine  (CATAPRES ) 0.1 MG tablet TAKE 1 TABLET(0.1 MG) BY MOUTH THREE TIMES DAILY 90 tablet 11   diclofenac  Sodium (VOLTAREN ) 1 % GEL Apply 4 g topically 4 (four) times daily as needed. 100 g 3   dicyclomine  (BENTYL ) 10 MG capsule Take 10 mg by mouth in the morning, at noon, in the evening, and at bedtime.     EPINEPHrine  (EPIPEN  2-PAK) 0.3 mg/0.3 mL IJ SOAJ injection Inject 0.3 mg into the muscle as needed for anaphylaxis. 1 each 1   escitalopram  (LEXAPRO ) 20 MG tablet Take 1 tablet (20 mg total) by mouth daily. 90 tablet 0   ezetimibe  (ZETIA ) 10 MG tablet Take 1 tablet (10 mg total) by mouth daily. TAKE 1 TABLET(10 MG) BY MOUTH DAILY 90 tablet 3   famotidine  (PEPCID ) 40 MG tablet TAKE 1 TABLET(40 MG) BY MOUTH DAILY 90 tablet 0   fluconazole  (DIFLUCAN ) 150 MG tablet Take 1 tablet (150 mg total) by mouth once a week. 12 tablet 1   Glucosamine 500 MG CAPS Take by mouth.     hydrALAZINE  (APRESOLINE ) 100 MG tablet Take 1 tablet (100 mg total) by mouth 3 (three) times daily. 270 tablet 3   hyoscyamine (LEVSIN) 0.125 MG tablet Take by mouth.     Krill Oil (OMEGA-3) 500 MG CAPS Take by mouth.     lisinopril   (ZESTRIL ) 5 MG tablet Take 1 tablet (5 mg total) by mouth daily. 90 tablet 3   loperamide  (IMODIUM ) 2 MG capsule Take 2 capsules (4 mg total) by mouth as needed for diarrhea or loose stools. 30 capsule 0   melatonin 3 MG TABS tablet Take 1 tablet (3 mg total) by mouth at bedtime. 30 tablet 0   methocarbamol  (ROBAXIN ) 500 MG tablet Take 500 mg by mouth.     Multiple Vitamin (MULTIVITAMIN WITH MINERALS) TABS Take 1 tablet by mouth daily.     Omega-3 Fatty Acids (FISH OIL ) 1000 MG CAPS Take by mouth.     ondansetron  (ZOFRAN -ODT) 4 MG disintegrating tablet Take 4 mg by mouth every 8 (eight) hours as needed.     pantoprazole  (PROTONIX ) 40 MG tablet TAKE 1 TABLET(40 MG) BY MOUTH DAILY 90 tablet 0   psyllium (METAMUCIL) 58.6 % packet Take 1 packet by mouth daily as needed (For fiber/thickener).      QUEtiapine  (SEROQUEL ) 100 MG tablet Take 1 tablet (100 mg total) by mouth at bedtime. 90 tablet 0   rifaximin  (XIFAXAN ) 550 MG TABS tablet Take 1 tablet (550 mg total) by mouth 2 (two) times daily. 42 tablet 4   SKYRIZI 360 MG/2.4ML SOCT Inject the contents of 1 cartridge (360mg ) under the skin every 8 weeks     Turmeric (QC TUMERIC COMPLEX PO) Take 1 tablet by mouth daily as needed (For inflammatory).     UNABLE TO FIND Med Name: IVERMETIN paste reported by patient OTC     vitamin B-12 (CYANOCOBALAMIN ) 100 MCG tablet Take 1 tablet (100 mcg total) by mouth daily. 30 tablet 0   No current facility-administered medications for this visit.     Objective: Psychiatric Specialty Exam: General Appearance: Casual, fairly groomed  Eye Contact:  Good    Speech:  Clear, coherent, normal rate, spontaneous  Volume:  Normal   Mood:  see above  Affect:  Appropriate, congruent, full range  Thought Content: Logical, rumination  ***  Suicidal Thoughts: see subjective  Thought Process:  Coherent, goal-directed,  circumstantial ***  Orientation:  A&Ox4   Memory:  Immediate good  Judgment:  Fair   Insight:  Fair***   Concentration:  Attention and concentration good   Recall:  Good  Fund of Knowledge: Good  Language: Good, fluent  Psychomotor Activity: Normal  Akathisia:  NA   AIMS (if indicated): NA   Assets:   {Assets (PAA):22698}  ADL's:  Intact  Cognition: WNL  Sleep: see above  Appetite: see above    Physical Exam    Metabolic Disorder Labs: Lab Results  Component Value Date   HGBA1C 5.9 (A) 01/27/2024   MPG 123 12/12/2020   No results found for: PROLACTIN Lab Results  Component Value Date   CHOL 142 12/26/2021   TRIG 100 12/26/2021   HDL 66 12/26/2021   CHOLHDL 2.2 12/26/2021   VLDL 14 12/12/2020   LDLCALC 58 12/26/2021   LDLCALC 76 07/03/2021   Lab Results  Component Value Date   TSH 0.222 (L) 12/15/2020    Therapeutic Level Labs: No results found for: LITHIUM No results found for: CBMZ No results found for: VALPROATE   Marlo Masson, MD 8/17/202512:47 PM

## 2024-02-01 ENCOUNTER — Ambulatory Visit (HOSPITAL_COMMUNITY): Payer: Self-pay | Admitting: Student in an Organized Health Care Education/Training Program

## 2024-02-01 ENCOUNTER — Encounter (HOSPITAL_COMMUNITY): Payer: Self-pay

## 2024-02-01 NOTE — Unmapped (Signed)
 HARINDER ROMAS 's SKYRIZI  360 mg/2.4 mL (150 mg/mL) Injt (risankizumab -rzaa) shipment will be rescheduled as a result of credit card on file and copay collected.     I have reached out to the patient  at [phone] and communicated the delay. We will reschedule the medication for the delivery date that the patient agreed upon.  We have confirmed the delivery date as 02/02/24

## 2024-02-03 NOTE — Unmapped (Signed)
 2nd level appeal approved. [facility] was able to ship to patient on 02/02/2024.  Request for [facility] PAP discontinued.

## 2024-03-04 ENCOUNTER — Other Ambulatory Visit: Payer: Self-pay | Admitting: Family Medicine

## 2024-03-04 DIAGNOSIS — K219 Gastro-esophageal reflux disease without esophagitis: Secondary | ICD-10-CM

## 2024-03-18 NOTE — Unmapped (Signed)
 [facility] has been contacted in regards to their refill of Skyrizi . At this time, they have declined refill due to pt wanting to speak with Dr. Lucian regarding recent hospitalization and Skyrizi  therapy . Refill assessment call date has been updated per the patient's request.

## 2024-03-20 NOTE — Progress Notes (Unsigned)
 Psychiatric Initial Adult Assessment  Patient Identification: Matthew Francis MRN:  [id number] Date of Evaluation:  03/20/2024 Referral Source: ***  Assessment:  Matthew Francis is a 63 y.o. male with a history of *** who presents to [facility] for initial evaluation of anxiety.  Patient reports ***  The patient's presentation is most consistent with a principal diagnosis of ***, as evidenced by ***.   Relevant Chart Review Notes/Encounters: *** Labs/Imaging: ***   Risk Assessment: A suicide and violence risk assessment was performed as part of this evaluation. There patient is deemed to be at chronic elevated risk for self-harm/suicide given the following factors: {SABSUICIDERISKFACTORS:29780}. These risk factors are mitigated by the following factors: {SABSUICIDEPROTECTIVEFACTORS:29779}. The patient is deemed to be at chronic elevated risk for violence given the following factors: {SABVIOLENCERISKFACTORS:29781}. These risk factors are mitigated by the following factors: {SABVIOLENCEPROTECTIVEFACTORS:29782}. There is no *** acute risk for suicide or violence at this time. The patient was educated about relevant modifiable risk factors including following recommendations for treatment of psychiatric illness and abstaining from substance abuse.  While future psychiatric events cannot be accurately predicted, the patient does not *** currently require  acute inpatient psychiatric care and does not *** currently meet [place]  involuntary commitment criteria.    Plan:  # *** Past medication trials:  Status of problem: new to me Interventions: -- ***  # *** Past medication trials:  Status of problem: new to me Interventions: -- ***  # *** Past medication trials:  Status of problem: new to me Interventions: -- ***  Health Maintenance PCP: Sebastian Beverley NOVAK, MD @ No data recorded  Patient was given contact information for behavioral health clinic and was  instructed to call 911 for emergencies.  Patient and plan of care will be discussed with the Attending MD ,Dr. ***, who agrees with the above statement and plan.   Subjective:  Chief Complaint: No chief complaint on file.   History of Present Illness:   *** The patient is here for ***. The patient reports a *** history of ***.  The patient's stressors include ***.   Sleep: *** Snoring: *** Caffeine : ***   Psychiatric ROS Depression: *** The patient denied any current symptoms of depression/***The patient reports a *** history of low mood and anhedonia, characterized by {Depression:32915}. Suicidal thoughts are ***. The patient denies any symptoms of ***.   Anxiety: *** The patient denied any current symptoms of anxiety/ ***The patient reports a *** history of excessive anxiety and worry about a number of events, characterized by {HJI:67083}.   Panic attacks: *** The patient denies any current or past history of panic attacks/*** The patient reports experiencing of intense surge of fear, in which the following symptoms develop: {Panic Attacks:32917}  Mania/Hypomania: *** Negative for any past or current symptoms of expansive energy or mood/ The patient reports a *** history of episodes of expansive energy and mood that can last up to *** days characterized by {MANIA:32922}    Last episode reported by patient: ***   PTSD: *** Negative for any history of trauma / ***The patient reports past exposure to ***.  Intrusions s/xs: ***  Hyperarousal s/xs: ***  Avoidance s/xs: ***  Negative effects on cognition and mood: ***  Eating Disorder: The patient denies any current or past history of disordered eating that includes restrictive, binging, purging behaviors, or any other compensatory behaviors / ***   IED: ***/ The patient reports a ** history of impulsive aggression characterized by {  PZI:67078}  Psychosis: *** The patient denies any history of hallucinations, disorganized thinking,  paranoia, or delusions. / ***    Safety: Active SI: ***Denied Passive SI: *** Access to firearms: *** Psychosis: as above  ROS   Past Psychiatric History:  Diagnoses: *** Medication trials: *** Previous psychiatrist/therapist: *** Hospitalizations: *** Suicide attempts: *** NSSIB Hx: *** Hx of violence towards others: *** Hx of trauma/abuse: ***  Substance Abuse History: Alcohol:  Hx of withdrawal seizures/Dts: *** Tobacco: *** Cannabis: *** Other Illicit Substance Use: IVDU: denied Detox Hx: *** Rehab Hx: ***  Past Medical History: PCP: Sebastian Beverley NOVAK, MD  Dx: *** ALL: *** Asacol [mesalamine], Penicillins, Sulfa antibiotics, Atorvastatin, Bee pollen, Bee venom, and Iodinated contrast media  Head trauma: *** Seizures: ***   Family Psychiatric History: *** Psychiatric disorders: *** Suicide hx: *** Homicide: ***  Social History: Living situation: *** Occupational status: *** Educational history: *** Marital Status: *** Children: *** Legal Hx: *** DUI/DWI: *** Military Hx: *** Developmental Hx: School Performance: Upbringing/Relationship with parents: Major Family stressors:  Past Medical History:  Past Medical History:  Diagnosis Date   Arthritis    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 02/09/2012   GERD (gastroesophageal reflux disease)    H/O ulcerative colitis    Hyperlipidemia    Hypertension    ICH (intracerebral hemorrhage) (HCC) 12/11/2020   Inguinal hernia    Nontraumatic subcortical hemorrhage of brain (HCC)    Severe sepsis (HCC) 01/17/2020   Thrombocytopenia 01/17/2020   Ulcer    ulcerative colitis   Ulcerative colitis    UTI due to extended-spectrum beta lactamase (ESBL) producing Escherichia coli 01/19/2020   Wears glasses     Past Surgical History:  Procedure Laterality Date   APPENDECTOMY     COLECTOMY     Proctocolectomy with IPAA   COLECTOMY     FOREIGN BODY REMOVAL ABDOMINAL  02/17/2012   Procedure: REMOVAL  FOREIGN BODY ABDOMINAL;  Surgeon: Krystal JINNY Russell, MD;  Location: [facility];  Service: General;  Laterality: N/A;  abdominal wound exploration and removal of foreign body   HERNIA REPAIR     Multiple incisional hernias.   ileoanal anastomosis      Family History:  Family History  Problem Relation Age of Onset   Neurodegenerative disease Mother    Cancer Father        prostate cancer   Cancer Sister    Diabetes Paternal Grandmother    Stroke Paternal Grandfather     Social History:   Social History   Socioeconomic History   Marital status: Single    Spouse name: Not on file   Number of children: Not on file   Years of education: Not on file   Highest education level: Some college, no degree  Occupational History    Comment: restaurant   Occupation: [employer]  Tobacco Use   Smoking status: Never   Smokeless tobacco: Never  Vaping Use   Vaping status: Never Used  Substance and Sexual Activity   Alcohol use: Yes    Alcohol/week: 21.0 standard drinks of alcohol    Types: 21 Cans of beer per week   Drug use: Not Currently    Types: Cocaine   Sexual activity: Not Currently  Other Topics Concern   Not on file  Social History Narrative   Not on file   Social Drivers of Health   Financial Resource Strain: Medium Risk (11/15/2023)   Overall Financial Resource Strain (CARDIA)  Difficulty of Paying Living Expenses: Somewhat hard  Food Insecurity: Low Risk  (02/22/2024)   Received from [facility]   Hunger Vital Sign    Within the past 12 months, you worried that your food would run out before you got money to buy more: Never true    Within the past 12 months, the food you bought just didn't last and you didn't have money to get more. : Never true  Transportation Needs: No Transportation Needs (02/22/2024)   Received from [facility]    In the past 12 months, has lack of reliable transportation kept you from medical appointments,  meetings, work or from getting things needed for daily living? : No  Physical Activity: Unknown (11/15/2023)   Exercise Vital Sign    Days of Exercise per Week: 0 days    Minutes of Exercise per Session: Not on file  Stress: Stress Concern Present (11/15/2023)   [employer] of Occupational Health - Occupational Stress Questionnaire    Feeling of Stress : Rather much  Social Connections: Moderately Isolated (11/15/2023)   Social Connection and Isolation Panel    Frequency of Communication with Friends and Family: More than three times a week    Frequency of Social Gatherings with Friends and Family: Once a week    Attends Religious Services: 1 to 4 times per year    Active Member of [employer] or Organizations: No    Attends Engineer, structural: Not on file    Marital Status: Never married    Allergies:   Allergies  Allergen Reactions   Asacol [Mesalamine] Swelling    Throat    Penicillins Anaphylaxis    Has patient had a PCN reaction causing immediate rash, facial/tongue/throat swelling, SOB or lightheadedness with hypotension: Yes Has patient had a PCN reaction causing severe rash involving mucus membranes or skin necrosis: No Has patient had a PCN reaction that required hospitalization: Yes Has patient had a PCN reaction occurring within the last 10 years: No If all of the above answers are NO, then may proceed with Cephalosporin use.   Sulfa Antibiotics Anaphylaxis   Atorvastatin Other (See Comments)    Myalgia, muscle cramps   Bee Pollen Hives   Bee Venom Hives and Swelling   Iodinated Contrast Media     Pt reports allergy at [place]. Rapid heartbeat and dyspnea    Current Medications: Current Outpatient Medications  Medication Sig Dispense Refill   acetaminophen  (TYLENOL ) 325 MG tablet Take 2 tablets (650 mg total) by mouth every 6 (six) hours as needed for moderate pain or mild pain.     amLODipine  (NORVASC ) 10 MG tablet TAKE 1 TABLET(10 MG) BY MOUTH DAILY  90 tablet 0   ascorbic acid  (VITAMIN C) 500 MG tablet Take 1 tablet (500 mg total) by mouth daily. 30 tablet 0   celecoxib  (CELEBREX ) 50 MG capsule TAKE 1 CAPSULE(50 MG) BY MOUTH TWICE DAILY AS NEEDED FOR PAIN 180 capsule 0   cetirizine (ZYRTEC) 10 MG tablet Take 10 mg by mouth daily as needed for allergies.      Cholecalciferol (VITAMIN D3) 1.25 MG (50000 UT) CAPS Take 1 capsule (50,000 Units total) by mouth once a week. 4 capsule 5   ciprofloxacin  (CIPRO ) 500 MG tablet Take 500 mg by mouth.     cloNIDine  (CATAPRES ) 0.1 MG tablet TAKE 1 TABLET(0.1 MG) BY MOUTH THREE TIMES DAILY 90 tablet 11   diclofenac  Sodium (VOLTAREN ) 1 % GEL Apply 4 g topically  4 (four) times daily as needed. 100 g 3   dicyclomine  (BENTYL ) 10 MG capsule Take 10 mg by mouth in the morning, at noon, in the evening, and at bedtime.     EPINEPHrine  (EPIPEN  2-PAK) 0.3 mg/0.3 mL IJ SOAJ injection Inject 0.3 mg into the muscle as needed for anaphylaxis. 1 each 1   escitalopram  (LEXAPRO ) 20 MG tablet Take 1 tablet (20 mg total) by mouth daily. 90 tablet 0   ezetimibe  (ZETIA ) 10 MG tablet Take 1 tablet (10 mg total) by mouth daily. TAKE 1 TABLET(10 MG) BY MOUTH DAILY 90 tablet 3   famotidine  (PEPCID ) 40 MG tablet TAKE 1 TABLET(40 MG) BY MOUTH DAILY 90 tablet 0   fluconazole  (DIFLUCAN ) 150 MG tablet Take 1 tablet (150 mg total) by mouth once a week. 12 tablet 1   Glucosamine 500 MG CAPS Take by mouth.     hydrALAZINE  (APRESOLINE ) 100 MG tablet Take 1 tablet (100 mg total) by mouth 3 (three) times daily. 270 tablet 3   hyoscyamine (LEVSIN) 0.125 MG tablet Take by mouth.     Krill Oil (OMEGA-3) 500 MG CAPS Take by mouth.     lisinopril  (ZESTRIL ) 5 MG tablet Take 1 tablet (5 mg total) by mouth daily. 90 tablet 3   loperamide  (IMODIUM ) 2 MG capsule Take 2 capsules (4 mg total) by mouth as needed for diarrhea or loose stools. 30 capsule 0   melatonin 3 MG TABS tablet Take 1 tablet (3 mg total) by mouth at bedtime. 30 tablet 0    methocarbamol  (ROBAXIN ) 500 MG tablet Take 500 mg by mouth.     Multiple Vitamin (MULTIVITAMIN WITH MINERALS) TABS Take 1 tablet by mouth daily.     Omega-3 Fatty Acids (FISH OIL ) 1000 MG CAPS Take by mouth.     ondansetron  (ZOFRAN -ODT) 4 MG disintegrating tablet Take 4 mg by mouth every 8 (eight) hours as needed.     pantoprazole  (PROTONIX ) 40 MG tablet TAKE 1 TABLET(40 MG) BY MOUTH DAILY 90 tablet 0   psyllium (METAMUCIL) 58.6 % packet Take 1 packet by mouth daily as needed (For fiber/thickener).      QUEtiapine  (SEROQUEL ) 100 MG tablet Take 1 tablet (100 mg total) by mouth at bedtime. 90 tablet 0   rifaximin  (XIFAXAN ) 550 MG TABS tablet Take 1 tablet (550 mg total) by mouth 2 (two) times daily. 42 tablet 4   SKYRIZI 360 MG/2.4ML SOCT Inject the contents of 1 cartridge (360mg ) under the skin every 8 weeks     Turmeric (QC TUMERIC COMPLEX PO) Take 1 tablet by mouth daily as needed (For inflammatory).     UNABLE TO FIND Med Name: IVERMETIN paste reported by patient OTC     vitamin B-12 (CYANOCOBALAMIN ) 100 MCG tablet Take 1 tablet (100 mcg total) by mouth daily. 30 tablet 0   No current facility-administered medications for this visit.     Objective: Psychiatric Specialty Exam: General Appearance: Casual, fairly groomed  Eye Contact:  Good    Speech:  Clear, coherent, normal rate, spontaneous  Volume:  Normal   Mood:  see above  Affect:  Appropriate, congruent, full range  Thought Content: Logical, rumination  ***  Suicidal Thoughts: see subjective  Thought Process:  Coherent, goal-directed, circumstantial ***  Orientation:  A&Ox4   Memory:  Immediate good  Judgment:  Fair   Insight:  Fair***  Concentration:  Attention and concentration good   Recall:  Good  Fund of Knowledge: Good  Language: Good, fluent  Psychomotor Activity: Normal  Akathisia:  NA   AIMS (if indicated): NA   Assets:   {Assets (PAA):22698}  ADL's:  Intact  Cognition: WNL  Sleep: see above  Appetite: see  above    Physical Exam    Metabolic Disorder Labs: Lab Results  Component Value Date   HGBA1C 5.9 (A) 01/27/2024   MPG 123 12/12/2020   No results found for: PROLACTIN Lab Results  Component Value Date   CHOL 142 12/26/2021   TRIG 100 12/26/2021   HDL 66 12/26/2021   CHOLHDL 2.2 12/26/2021   VLDL 14 12/12/2020   LDLCALC 58 12/26/2021   LDLCALC 76 07/03/2021   Lab Results  Component Value Date   TSH 0.222 (L) 12/15/2020    Therapeutic Level Labs: No results found for: LITHIUM No results found for: CBMZ No results found for: VALPROATE   Marlo Masson, MD 10/5/20258:40 PM This encounter was created in error - please disregard.

## 2024-03-21 ENCOUNTER — Encounter (HOSPITAL_COMMUNITY): Payer: Self-pay

## 2024-03-21 ENCOUNTER — Encounter (HOSPITAL_COMMUNITY): Payer: Self-pay | Admitting: Student in an Organized Health Care Education/Training Program

## 2024-03-22 NOTE — Unmapped (Signed)
 Patient contacted unclear if he should continue skyrizi  post recent local hospitalization for infection and cancer diagnosis.  Dr. Lucian will need to discuss with him but plan for continuation, appt TBD.  Patient instructed to order medication now.

## 2024-03-23 ENCOUNTER — Other Ambulatory Visit: Payer: Self-pay | Admitting: Family Medicine

## 2024-03-23 DIAGNOSIS — K219 Gastro-esophageal reflux disease without esophagitis: Secondary | ICD-10-CM

## 2024-03-23 NOTE — Progress Notes (Signed)
 Erroneous encounter    No show

## 2024-03-23 NOTE — Unmapped (Addendum)
 [facility] Clinical Assessment & Refill Coordination Note    Received okay from provider to set-up next shipment and will reach out to pt to discuss further    Tony Andrade, DOB: [birth year]  Phone: [phone] (home)     All above HIPAA information was verified with patient.     Was a Nurse, learning disability used for this call? No    Specialty Medication(s):   Inflammatory Disorders: Skyrizi      Current Medications[1]     Changes to medications: Mikle reports no changes at this time.    Medication list has been reviewed and updated in Epic: Yes    Allergies[2]    Changes to allergies: No    Allergies have been reviewed and updated in Epic: Yes    SPECIALTY MEDICATION ADHERENCE     Skyrizi  360 mg/2.52mL: 0 doses of medicine on hand     Medication Adherence    Patient reported X missed doses in the last month: 0  Specialty Medication: Skyrizi  360mg /2.56mL  Patient is on additional specialty medications: No  Informant: patient          Specialty medication(s) dose(s) confirmed: Regimen is correct and unchanged.     Are there any concerns with adherence? No    Adherence counseling provided? Not needed    CLINICAL MANAGEMENT AND INTERVENTION      Clinical Benefit Assessment:    Do you feel the medicine is effective or helping your condition? Yes    Clinical Benefit counseling provided? Not needed    Adverse Effects Assessment:    Are you experiencing any side effects? No    Are you experiencing difficulty administering your medicine? No    Quality of Life Assessment:    Quality of Life    Rheumatology  Oncology  Dermatology  Cystic Fibrosis          How many days over the past month did your crohn's  keep you from your normal activities? For example, brushing your teeth or getting up in the morning. 0    Have you discussed this with your provider? Not needed    Acute Infection Status:    Acute infections noted within Epic:  No active infections    Patient reported infection: None    Therapy Appropriateness:    Is the medication and dose appropriate considering the patient???s diagnosis, treatment, and disease journey, comorbidities, medical history, current medications, allergies, therapeutic goals, self-administration ability, and access barriers? Yes, therapy is appropriate and should be continued     Clinical Intervention:    Was an intervention completed as part of this clinical assessment? No    DISEASE/MEDICATION-SPECIFIC INFORMATION      For patients on injectable medications: Next injection is scheduled for 10/14.    Chronic Inflammatory Diseases: Have you experienced any flares in the last month? No  Has this been reported to your provider? Not applicable    PATIENT SPECIFIC NEEDS     Does the patient have any physical, cognitive, or cultural barriers? No    Is the patient high risk? No    Does the patient require physician intervention or other additional services (i.e., nutrition, smoking cessation, social work)? No    Does the patient have an additional or emergency contact listed in their chart? Yes    SOCIAL DETERMINANTS OF HEALTH     At the [facility], we have learned that life circumstances - like trouble affording food, housing, utilities, or transportation can affect  the health of many of our patients.   That is why we wanted to ask: are you currently experiencing any life circumstances that are negatively impacting your health and/or quality of life? No    Social Drivers of Health     Food Insecurity: Low Risk  (02/22/2024)    Received from [facility]    Hunger Vital Sign     Within the past 12 months, you worried that your food would run out before you got money to buy more: Never true     Within the past 12 months, the food you bought just didn't last and you didn't have money to get more. : Never true   Tobacco Use: Low Risk  (02/20/2024)    Received from [facility]    Patient History     Smoking Tobacco Use: Never     Smokeless Tobacco Use: Never     Passive Exposure: Not on file   Transportation Needs: No Transportation Needs (02/22/2024)    Received from [employer]     In the past 12 months, has lack of reliable transportation kept you from medical appointments, meetings, work or from getting things needed for daily living? : No   Alcohol Use: Not on file   Housing: Low Risk  (02/22/2024)    Received from [facility]    Housing Stability Vital Sign     What is your living situation today?: I have a steady place to live     Think about the place you live. Do you have problems with any of the following? Choose all that apply:: None/None on this list   Physical Activity: Unknown (11/15/2023)    Received from [facility]    Exercise Vital Sign     On average, how many days per week do you engage in moderate to strenuous exercise (like a brisk walk)?: 0 days     Minutes of Exercise per Session: Not on file   Utilities: Low Risk  (02/22/2024)    Received from [facility]    Utilities     In the past 12 months has the electric, gas, oil, or water company threatened to shut off services in your home? : No   Stress: Stress Concern Present (11/15/2023)    Received from [facility] - Occupational Stress Questionnaire     Feeling of Stress : Rather much   Interpersonal Safety: Not on file   Substance Use: Not on file (04/26/2023)   Intimate Partner Violence: Not At Risk (11/28/2022)    Humiliation, Afraid, Rape, and Kick questionnaire     Fear of Current or Ex-Partner: No     Emotionally Abused: No     Physically Abused: No     Sexually Abused: No   Social Connections: Moderately Isolated (11/15/2023)    Received from [facility]    Social Connection and Isolation Panel     In a typical week, how many times do you talk on the phone with family, friends, or neighbors?: More than three times a week     How often do you get together with friends or relatives?: Once a week     How often do you attend church or religious services?: 1 to 4 times per year     Do you belong to any clubs or organizations such as church groups, unions, fraternal or athletic groups, or school groups?: No  Attends Banker Meetings: Not on file     Are you married, widowed, divorced, separated, never married, or living with a partner?: Never married   Physicist, medical Strain: Medium Risk (11/15/2023)    Received from [facility]    Overall Financial Resource Strain (CARDIA)     Difficulty of Paying Living Expenses: Somewhat hard   Health Literacy: Not on file   Internet Connectivity: Not on file       Would you be willing to receive help with any of the needs that you have identified today? Not applicable       SHIPPING     Specialty Medication(s) to be Shipped:   Inflammatory Disorders: Skyrizi     Other medication(s) to be shipped: No additional medications requested for fill at this time    Specialty Medications not needed at this time: N/A     Changes to insurance: No    Cost and Payment: Patient has a copay of $4. They are aware and have authorized the pharmacy to charge the credit card on file.    Delivery Scheduled: Yes, Expected medication delivery date: 10/10.     Medication will be delivered via UPS to the confirmed prescription address in [facility].    The patient will receive a drug information handout for each medication shipped and additional FDA Medication Guides as required.  Verified that patient has previously received a Conservation officer, historic buildings and a Surveyor, mining.    The patient or caregiver noted above participated in the development of this care plan and knows that they can request review of or adjustments to the care plan at any time.      All of the patient's questions and concerns have been addressed.    Harlene DELENA Elder, PharmD   [facility] Specialty Pharmacist         [1]   Current Outpatient Medications   Medication Sig Dispense Refill    acetaminophen (TYLENOL) 500 MG tablet Take 1 tablet (500 mg total) by mouth every six (6) hours as needed for pain.      activated charcoal, bulk, Powd by Miscellaneous route daily as needed.      amlodipine  (NORVASC ) 10 MG tablet Take 1 tablet (10 mg total) by mouth daily. TAKE 1 TABLET (10 MG TOTAL) BY MOUTH DAILY.      ascorbic acid, vitamin C, (VITAMIN C) 500 MG tablet Take 1 tablet (500 mg total) by mouth two (2) times a day.      cetirizine (ZYRTEC) 10 MG tablet Take 1 tablet (10 mg total) by mouth daily.      cholecalciferol , vitamin D3-1,250 mcg, 50,000 unit,, 1,250 mcg (50,000 unit) capsule Take 1 capsule (1,250 mcg total) by mouth once a week. 12 capsule 1    ciprofloxacin  HCl (CIPRO ) 500 MG tablet TAKE 1 TABLET(500 MG) BY MOUTH TWICE DAILY 60 tablet 2    cyanocobalamin 100 MCG tablet Take 1 tablet (100 mcg total) by mouth daily.      dicyclomine  (BENTYL ) 10 mg capsule Take 1 capsule (10 mg total) by mouth four (4) times a day as needed. 360 capsule 3    diphenoxylate -atropine  (LOMOTIL ) 2.5-0.025 mg per tablet TAKE 1 TABLET BY MOUTH FOUR TIMES DAILY AS NEEDED FOR DIARRHEA 240 tablet 1    empty container Misc Use as directed 1 each 3    EPINEPHrine (EPIPEN) 0.3 mg/0.3 mL injection Inject 0.3 mL (0.3 mg total) into the muscle once.  escitalopram oxalate (LEXAPRO) 10 MG tablet Take 1 tablet (10 mg total) by mouth daily. TAKE 1 TABLET (10 MG TOTAL) BY MOUTH DAILY.      ezetimibe (ZETIA) 10 mg tablet Take 1 tablet (10 mg total) by mouth daily.      famotidine (PEPCID) 40 MG tablet Take 1 tablet (40 mg total) by mouth daily as needed for heartburn.      fluconazole (DIFLUCAN) 150 MG tablet Take 1 tablet (150 mg total) by mouth once a week.      glucosamine sulfate (GLUCOSAMINE ORAL) Take 1 tablet by mouth daily at 0600.      hydrALAZINE (APRESOLINE) 50 MG tablet Take 2 tablets (100 mg total) by mouth two (2) times a day.      hydrocortisone  (ANUSOL -HC) 2.5 % rectal cream Insert into the rectum two (2) times a day. 30 g 2    hyoscyamine  (LEVSIN ) 0.125 mg tablet Take 1 tablet (0.125 mg total) by mouth every four (4) hours as needed for cramping. 90 tablet 3    ketoconazole (NIZORAL) 2 % cream Two (2) times a day.      Lactobacillus acidophilus (PROBIOTIC ORAL) Take 1 capsule by mouth daily at 0600.      lisinopril (PRINIVIL,ZESTRIL) 5 MG tablet Take 1 tablet (5 mg total) by mouth daily.      loperamide  (IMODIUM ) 2 mg capsule Take 2 capsules (4 mg total) by mouth four (4) times a day as needed for diarrhea. 720 capsule 2    melatonin 5 mg cap Take 10 mg by mouth nightly.      methocarbamoL (ROBAXIN) 500 MG tablet Take 1 tablet (500 mg total) by mouth every eight (8) hours as needed.      multivitamin with minerals tablet Take 1 tablet by mouth at bedtime.      naproxen sodium (ALEVE) 220 MG tablet Take 1 tablet (220 mg total) by mouth daily as needed.      OMEGA-3/DHA/EPA/FISH OIL (FISH OIL-OMEGA-3 FATTY ACIDS) 300-1,000 mg capsule Take 2 capsules (2 g total) by mouth Two (2) times a day.      pantoprazole (PROTONIX) 40 MG tablet Take 1 tablet (40 mg total) by mouth daily.      QUEtiapine (SEROQUEL) 25 MG tablet Take 1 tablet (25 mg total) by mouth nightly.      rifAXIMin  (XIFAXAN ) 550 mg Tab Take 1 tablet (550 mg total) by mouth two (2) times a day. 28 tablet 0    risankizumab -rzaa (SKYRIZI ) 360 mg/2.4 mL (150 mg/mL) Injt Inject the contents of 1 cartridge (360mg ) under the skin every 8 weeks 2.4 mL 5    UNABLE TO FIND Take 1 packet by mouth daily. Electrolyte Stamina  Power Pack       No current facility-administered medications for this visit.   [2]   Allergies  Allergen Reactions    Mesalamine Hives and Swelling     Throat    Penicillins Anaphylaxis and Swelling     In throat    Sulfa (Sulfonamide Antibiotics) Swelling     throat    Venom-Honey Bee Hives and Swelling    Atorvastatin Other (See Comments)     Myalgia, muscle cramps    Iodinated Contrast Media      Pt reports allergy at [place]. Rapid heartbeat and dyspnea

## 2024-03-24 MED FILL — SKYRIZI 360 MG/2.4 ML (150 MG/ML) SUBCUTANEOUS WEARABLE INJECTOR: SUBCUTANEOUS | 56 days supply | Qty: 2.4 | Fill #1

## 2024-03-30 DIAGNOSIS — K610 Anal abscess: Principal | ICD-10-CM

## 2024-03-30 DIAGNOSIS — K603 Perianal fistula: Principal | ICD-10-CM

## 2024-03-30 MED ORDER — CHOLECALCIFEROL (VITAMIN D3) 1,250 MCG (50,000 UNIT) CAPSULE
ORAL_CAPSULE | ORAL | 1 refills | 84.00000 days | Status: CP
Start: 2024-03-30 — End: ?

## 2024-03-30 MED ORDER — CIPROFLOXACIN 500 MG TABLET
ORAL_TABLET | Freq: Two times a day (BID) | ORAL | 2 refills | 30.00000 days | Status: CP
Start: 2024-03-30 — End: ?

## 2024-03-30 NOTE — Unmapped (Signed)
 Encounter for refill request:  Last clinic visit: 12/17/2023  Colonoscopy:   Appointments which have been scheduled for you      Apr 21, 2024 2:00 PM  (Arrive by 1:45 PM)  RETURN IBD with Mikle Cleora Martinez, MD  [facility] [place] [facility]) [address]  [facility] [address]  [place] [address]  [phone]             Lab Results   Component Value Date    WBC 5.5 11/11/2023    RBC 3.81 (L) 11/11/2023    HGB 11.7 (L) 11/11/2023    HCT 35.4 (L) 11/11/2023    PLT 189 11/11/2023    ALT 11 11/11/2023    AST 25 11/11/2023    ALKPHOS 79 11/11/2023    CRP 3 11/11/2023    CREATININE 1.38 (H) 11/11/2023       Cipro  and Vitamin D refills authorized x   vit d not authorized, cipro    Cipro - sent 90 days on 10/01/23  Cipro  was last filled on 01/13/24, sent to [facility] to review  Vitamin D- last sent in on 11/30/23 (6 mo supply)  Next appnt is 04/21/24 with [facility]

## 2024-04-04 ENCOUNTER — Other Ambulatory Visit: Payer: Self-pay | Admitting: Family Medicine

## 2024-04-04 DIAGNOSIS — I10 Essential (primary) hypertension: Secondary | ICD-10-CM

## 2024-04-04 NOTE — Telephone Encounter (Unsigned)
 Copied from CRM [id number]. Topic: Clinical - Medication Refill >> Apr 04, 2024  5:08 PM Shereese L wrote: Medication: amLODipine  (NORVASC ) 10 MG tablet  Has the patient contacted their pharmacy? Yes (Agent: If no, request that the patient contact the pharmacy for the refill. If patient does not wish to contact the pharmacy document the reason why and proceed with request.) (Agent: If yes, when and what did the pharmacy advise?)  This is the patient's preferred pharmacy:  [facility] [phone] - [place], [place] - [address] AT [facility] [address] [place] [address] Phone: [phone] Fax: [phone]  Is this the correct pharmacy for this prescription? Yes If no, delete pharmacy and type the correct one.   Has the prescription been filled recently? Yes  Is the patient out of the medication? Yes  Has the patient been seen for an appointment in the last year OR does the patient have an upcoming appointment? Yes  Can we respond through MyChart? Yes  Agent: Please be advised that Rx refills may take up to 3 business days. We ask that you follow-up with your pharmacy.

## 2024-04-12 ENCOUNTER — Other Ambulatory Visit: Payer: Self-pay | Admitting: Family Medicine

## 2024-04-12 DIAGNOSIS — I10 Essential (primary) hypertension: Secondary | ICD-10-CM

## 2024-04-12 NOTE — Telephone Encounter (Signed)
 Copied from CRM [id number]. Topic: Clinical - Prescription Issue >> Apr 12, 2024  4:31 PM China J wrote: Reason for CRM: The patient spoke with a triage nurse and a medical assistant about his amlodipine  being refilled. During the discussion, the patient said that the medical assistant/nurse promised that they would have Dr. Sebastian send in enough medication to last him until his follow up appointment.   The patient is now at the pharmacy and is wondering what is going on with his medication.

## 2024-04-13 MED ORDER — AMLODIPINE BESYLATE 10 MG PO TABS: 10.0000 mg | ORAL_TABLET | Freq: Every day | ORAL | 0 refills | Status: DC

## 2024-04-13 NOTE — Telephone Encounter (Signed)
 Contacted patient this morning and he informed me he was completely out of his amlodipine . Patient states he has been taking only 1 a day, but for some reason he ran out. Sent in him a refill for his amlodipine .

## 2024-04-21 ENCOUNTER — Ambulatory Visit
Admit: 2024-04-21 | Discharge: 2024-04-22 | Payer: Medicaid (Managed Care) | Attending: Gastroenterology | Primary: Gastroenterology

## 2024-04-21 DIAGNOSIS — K91850 Pouchitis: Principal | ICD-10-CM

## 2024-04-21 DIAGNOSIS — K603 Perianal fistula: Principal | ICD-10-CM

## 2024-04-21 DIAGNOSIS — K610 Anal abscess: Principal | ICD-10-CM

## 2024-04-21 DIAGNOSIS — K50018 Crohn's disease of small intestine with other complication: Principal | ICD-10-CM

## 2024-04-21 DIAGNOSIS — D84821 Immunosuppression due to drug therapy (HHS-HCC): Principal | ICD-10-CM

## 2024-04-21 DIAGNOSIS — K580 Irritable bowel syndrome with diarrhea: Principal | ICD-10-CM

## 2024-04-21 LAB — CBC W/ AUTO DIFF
BASOPHILS ABSOLUTE COUNT: 0 10*9/L (ref 0.0–0.1)
BASOPHILS RELATIVE PERCENT: 0.8 %
EOSINOPHILS ABSOLUTE COUNT: 0.3 10*9/L (ref 0.0–0.5)
EOSINOPHILS RELATIVE PERCENT: 7.1 %
HEMATOCRIT: 36.9 % — ABNORMAL LOW (ref 39.0–48.0)
HEMOGLOBIN: 12.3 g/dL — ABNORMAL LOW (ref 12.9–16.5)
LYMPHOCYTES ABSOLUTE COUNT: 1.3 10*9/L (ref 1.1–3.6)
LYMPHOCYTES RELATIVE PERCENT: 29.4 %
MEAN CORPUSCULAR HEMOGLOBIN CONC: 33.4 g/dL (ref 32.0–36.0)
MEAN CORPUSCULAR HEMOGLOBIN: 29.7 pg (ref 25.9–32.4)
MEAN CORPUSCULAR VOLUME: 88.9 fL (ref 77.6–95.7)
MEAN PLATELET VOLUME: 7.9 fL (ref 6.8–10.7)
MONOCYTES ABSOLUTE COUNT: 0.4 10*9/L (ref 0.3–0.8)
MONOCYTES RELATIVE PERCENT: 7.7 %
NEUTROPHILS ABSOLUTE COUNT: 2.5 10*9/L (ref 1.8–7.8)
NEUTROPHILS RELATIVE PERCENT: 55 %
PLATELET COUNT: 180 10*9/L (ref 150–450)
RED BLOOD CELL COUNT: 4.15 10*12/L — ABNORMAL LOW (ref 4.26–5.60)
RED CELL DISTRIBUTION WIDTH: 15.3 % — ABNORMAL HIGH (ref 12.2–15.2)
WBC ADJUSTED: 4.6 10*9/L (ref 3.6–11.2)

## 2024-04-21 LAB — VITAMIN B12: VITAMIN B-12: 8199 pg/mL — ABNORMAL HIGH (ref 211–911)

## 2024-04-21 LAB — COMPREHENSIVE METABOLIC PANEL
ALBUMIN: 4.1 g/dL (ref 3.4–5.0)
ALKALINE PHOSPHATASE: 73 U/L (ref 46–116)
ALT (SGPT): 12 U/L (ref 10–49)
ANION GAP: 14 mmol/L (ref 5–14)
AST (SGOT): 17 U/L (ref <=34)
BILIRUBIN TOTAL: 0.7 mg/dL (ref 0.3–1.2)
BLOOD UREA NITROGEN: 15 mg/dL (ref 9–23)
BUN / CREAT RATIO: 10
CALCIUM: 9.9 mg/dL (ref 8.7–10.4)
CHLORIDE: 107 mmol/L (ref 98–107)
CO2: 23.2 mmol/L (ref 20.0–31.0)
CREATININE: 1.56 mg/dL — ABNORMAL HIGH (ref 0.73–1.18)
EGFR CKD-EPI (2021) MALE: 50 mL/min/1.73m2 — ABNORMAL LOW (ref >=60)
GLUCOSE RANDOM: 112 mg/dL — ABNORMAL HIGH (ref 70–99)
POTASSIUM: 4.3 mmol/L (ref 3.4–4.8)
PROTEIN TOTAL: 7.5 g/dL (ref 5.7–8.2)
SODIUM: 144 mmol/L (ref 135–145)

## 2024-04-21 LAB — IRON PANEL
IRON SATURATION: 25 % (ref 20–55)
IRON: 74 ug/dL (ref 65–175)
TOTAL IRON BINDING CAPACITY: 293 ug/dL (ref 250–425)

## 2024-04-21 LAB — FOLATE: FOLATE: 9.2 ng/mL (ref >=5.4)

## 2024-04-21 LAB — C-REACTIVE PROTEIN: C-REACTIVE PROTEIN: <5 mg/L (ref <=10.0)

## 2024-04-21 LAB — FERRITIN: FERRITIN: 96.2 ng/mL (ref 10.5–307.3)

## 2024-04-21 MED ORDER — DIPHENOXYLATE-ATROPINE 2.5 MG-0.025 MG TABLET
ORAL_TABLET | Freq: Two times a day (BID) | ORAL | 3 refills | 90.00000 days | Status: CP
Start: 2024-04-21 — End: 2025-04-21

## 2024-04-21 MED ORDER — CIPROFLOXACIN 500 MG TABLET
ORAL_TABLET | Freq: Two times a day (BID) | ORAL | 3 refills | 90.00000 days | Status: CP
Start: 2024-04-21 — End: 2025-04-21

## 2024-04-21 MED ORDER — DICYCLOMINE 10 MG CAPSULE
ORAL_CAPSULE | Freq: Four times a day (QID) | ORAL | 3 refills | 90.00000 days | Status: CP | PRN
Start: 2024-04-21 — End: 2025-04-21

## 2024-04-21 MED ORDER — HYOSCYAMINE SULFATE 0.125 MG TABLET
ORAL_TABLET | ORAL | 3 refills | 15.00000 days | Status: CP | PRN
Start: 2024-04-21 — End: 2025-04-21

## 2024-04-21 MED ORDER — QUETIAPINE 100 MG TABLET
ORAL_TABLET | Freq: Every evening | ORAL | 11 refills | 30.00000 days
Start: 2024-04-21 — End: 2025-04-21

## 2024-04-21 NOTE — Progress Notes (Signed)
 [facility] CONSULTATION VISIT      REFERRING PROVIDER:  Pcp, None Per Patient  [address]  [place],  [place] [address]    PRIMARY CARE PROVIDER:  Sebastian Beverley Cain, MD      PATIENT PROFILE:        Tony Andrade is a 63 y.o. 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     CHIEF COMPLAINT: follow-up      HISTORY OF PRESENT ILLNESS: Tony Andrade is a 63 y.o. 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      History of Present Illness  Tony Andrade is a 62 year old male who presents for follow-up on Skyrizi .    He was recently hospitalized due to an ESBL E. coli infection, which caused significant pain and swelling. Initial treatment with Levaquin  provided minimal relief, leading to a switch to ertapenem. He received ertapenem in the hospital and continued treatment at home via a PICC line for a week, noting improvement in symptoms following this treatment.    Regarding his pouch, there is an improvement in bowel frequency, currently experiencing bowel movements approximately four times a day and twice at night. He attributes this improvement to the recent antibiotic treatment.    He is currently back on Skyrizi , having taken it last month. His medication regimen includes Lomotil  once daily, hyoscyamine  once daily, and Cipro  once daily. He also takes Celebrex and famotidine. He does not take his medications or eat before appointments to avoid interference with the evaluation.    He reports a hemoglobin A1c of 6.1. He drinks sodas but does not consume much sweet food.    He has a history of anemia and underwent extensive lab work during his recent hospitalization, which included frequent blood draws. He still has scars from the IVs used during his hospital stay.    He is not currently on dicyclomine  (Bentyl ) but uses it occasionally as needed. He has backup medications including rifaximin , Xifaxan , and Flagyl , which he keeps on hand for potential future use.          Bowel frequency /24 hours 6-10  Bowel frequency during night > 4  Blood in bowel movements none  Abdominal pain  Mild = 1  Bloating frequent  Incontinence none  Bodyweight stable  Loperamide  Yes 2 tablets  Lomotil  Yes 2 tablets  Hyoscyamine :  No  Antibiotic Other  Cipro        PROBLEMS:  Patient Active Problem List   Diagnosis    Essential hypertension    H/O total colectomy    Pouchitis    (CMS-HCC)    Gastroesophageal reflux disease without esophagitis    S/P hernia repair    Foreign body (FB) in soft tissue    Crohn's disease of small intestine with other complication (CMS-HCC)    Renal cell carcinoma of right kidney    (CMS-HCC)     REVIEW OF SYSTEMS:     The balance of 12 systems reviewed is negative except as noted in the HPI.     PAST MEDICAL HISTORY:    Past Medical History:   Diagnosis Date    GERD (gastroesophageal reflux disease)     H/O colectomy     Hypertension     Pouchitis    (CMS-HCC)     Stroke    (  CMS-HCC)     Ulcerative colitis (CMS-HCC)        MEDICATIONS:      Current Outpatient Medications:     acetaminophen (TYLENOL) 500 MG tablet, Take 1 tablet (500 mg total) by mouth every six (6) hours as needed for pain., Disp: , Rfl:     activated charcoal, bulk, Powd, by Miscellaneous route daily as needed., Disp: , Rfl:     amlodipine  (NORVASC ) 10 MG tablet, Take 1 tablet (10 mg total) by mouth daily. TAKE 1 TABLET (10 MG TOTAL) BY MOUTH DAILY., Disp: , Rfl:     ascorbic acid, vitamin C, (VITAMIN C) 500 MG tablet, Take 1 tablet (500 mg total) by mouth two (2) times a day., Disp: , Rfl:     celecoxib (CELEBREX) 50 MG capsule, Take 2 capsules (100 mg total) by mouth in the morning., Disp: , Rfl:     cetirizine (ZYRTEC) 10 MG tablet, Take 1 tablet (10 mg total) by mouth daily., Disp: , Rfl:     cholecalciferol , vitamin D3-1,250 mcg, 50,000 unit,, 1,250 mcg (50,000 unit) capsule, Take 1 capsule (1,250 mcg total) by mouth once a week., Disp: 12 capsule, Rfl: 1    cyanocobalamin 100 MCG tablet, Take 1 tablet (100 mcg total) by mouth daily., Disp: , Rfl:     empty container Misc, Use as directed, Disp: 1 each, Rfl: 3    EPINEPHrine (EPIPEN) 0.3 mg/0.3 mL injection, Inject 0.3 mL (0.3 mg total) into the muscle once., Disp: , Rfl:     escitalopram oxalate (LEXAPRO) 10 MG tablet, Take 1 tablet (10 mg total) by mouth daily. TAKE 1 TABLET (10 MG TOTAL) BY MOUTH DAILY., Disp: , Rfl:     ezetimibe (ZETIA) 10 mg tablet, Take 1 tablet (10 mg total) by mouth daily., Disp: , Rfl:     famotidine (PEPCID) 40 MG tablet, Take 1 tablet (40 mg total) by mouth daily as needed for heartburn., Disp: , Rfl:     fluconazole (DIFLUCAN) 150 MG tablet, Take 1 tablet (150 mg total) by mouth once a week., Disp: , Rfl:     glucosamine sulfate (GLUCOSAMINE ORAL), Take 1 tablet by mouth daily at 0600., Disp: , Rfl:     hydrALAZINE (APRESOLINE) 50 MG tablet, Take 2 tablets (100 mg total) by mouth two (2) times a day., Disp: , Rfl:     hydrocortisone  (ANUSOL -HC) 2.5 % rectal cream, Insert into the rectum two (2) times a day., Disp: 30 g, Rfl: 2    ketoconazole (NIZORAL) 2 % cream, Two (2) times a day., Disp: , Rfl:     Lactobacillus acidophilus (PROBIOTIC ORAL), Take 1 capsule by mouth daily at 0600., Disp: , Rfl:     lisinopril (PRINIVIL,ZESTRIL) 5 MG tablet, Take 1 tablet (5 mg total) by mouth daily., Disp: , Rfl:     loperamide  (IMODIUM ) 2 mg capsule, Take 2 capsules (4 mg total) by mouth four (4) times a day as needed for diarrhea., Disp: 720 capsule, Rfl: 2    melatonin 5 mg cap, Take 10 mg by mouth nightly., Disp: , Rfl:     multivitamin with minerals tablet, Take 1 tablet by mouth at bedtime., Disp: , Rfl:     OMEGA-3/DHA/EPA/FISH OIL (FISH OIL-OMEGA-3 FATTY ACIDS) 300-1,000 mg capsule, Take 2 capsules (2 g total) by mouth Two (2) times a day., Disp: , Rfl:     pantoprazole (PROTONIX) 40 MG tablet, Take 1 tablet (40 mg total) by mouth daily., Disp: , Rfl:  rifAXIMin  (XIFAXAN ) 550 mg Tab, Take 1 tablet (550 mg total) by mouth two (2) times a day., Disp: 28 tablet, Rfl: 0    risankizumab -rzaa (SKYRIZI ) 360 mg/2.4 mL (150 mg/mL) Injt, Inject the contents of 1 cartridge (360mg ) under the skin every 8 weeks, Disp: 2.4 mL, Rfl: 5    UNABLE TO FIND, Take 1 packet by mouth daily. Electrolyte Stamina  Power Pack, Disp: , Rfl:     ciprofloxacin  HCl (CIPRO ) 500 MG tablet, Take 1 tablet (500 mg total) by mouth two (2) times a day., Disp: 180 tablet, Rfl: 3    dicyclomine  (BENTYL ) 10 mg capsule, Take 1 capsule (10 mg total) by mouth four (4) times a day as needed., Disp: 360 capsule, Rfl: 3    diphenoxylate -atropine  (LOMOTIL ) 2.5-0.025 mg per tablet, Take 2 tablets by mouth two (2) times a day., Disp: 360 tablet, Rfl: 3    hyoscyamine  (LEVSIN ) 0.125 mg tablet, Take 1 tablet (0.125 mg total) by mouth every four (4) hours as needed for cramping., Disp: 90 tablet, Rfl: 3    QUEtiapine (SEROQUEL) 100 MG tablet, Take 1 tablet (100 mg total) by mouth nightly., Disp: 30 tablet, Rfl: 11      ALLERGIES:    Mesalamine, Penicillins, Sulfa (sulfonamide antibiotics), Venom-honey bee, Atorvastatin, and Iodinated contrast media    SOCIAL HISTORY:    Social History[1]      FAMILY HISTORY:    family history includes Other in his mother; Stroke in his father.     VITAL SIGNS:    BP 127/70 (BP Position: Sitting)  - Pulse 93  - Temp 36.8 ??C (98.3 ??F)  - Ht 180.3 cm (5' 11)  - Wt 97.5 kg (215 lb)  - BMI 29.99 kg/m??       Wt Readings from Last 3 Encounters:   04/21/24 97.5 kg (215 lb)   12/17/23 95.7 kg (211 lb)   07/09/23 97.7 kg (215 lb 6.4 oz)      Telehealth - NOT DONE    PHYSICAL EXAM:    Constitutional:   Alert, oriented x 3, no acute distress, well nourished, and well hydrated.   Mental Status:   Thought organized, appropriate affect, pleasantly interactive, not anxious appearing.   HEENT:   PEERL, conjunctiva clear, anicteric, oropharynx clear, neck supple, no LAD.   Respiratory: Rales bilateral upper lobes, unlabored breathing, + cough     Cardiac: Euvolemic, regular rate and rhythm, normal S1 and S2, no murmur.     Abdomen: Soft, normal bowel sounds, non-distended, non-tender, no organomegaly or masses.     Perianal/Rectal Exam Not done      Extremities:   No edema, well perfused.   Musculoskeletal: No joint swelling or tenderness noted, no deformities.     Skin: No rashes, jaundice or skin lesions noted. Scar left armpit, history of boils.      Neuro: No focal deficits.            DIAGNOSTIC STUDIES:  I have reviewed all pertinent diagnostic studies, including:    Imaging:    MRI Abdomen 06/2023  Impression: Postablation changes of the right kidney without evidence of locally   recurrent or metastatic disease to the abdomen.     MRI Pelvis 07/2022:  Impression: Posterior perianal abscess in the intersphincteric space measuring up   to 2.1 cm.   This is associated with a left posterolateral, intersphincteric perianal   fistula which also extends inferiorly, terminating at the skin surface of   the medial  left gluteal fold where there is a superficial, subcentimeter   fluid collection, likely the fistula drainage site and/or small   subcutaneous abscess.     CT 07/2022  Inflammatory changes are seen adjacent to the rectum. These findings are nonspecific and could represent underlying infectious or inflammatory process. No evidence of fistula or abscess, as clinically questioned.      - The proximal bowel anastomosis measures up to 7.3 cm. There is no upstream bowel dilation. Recommend correlation with symptoms.      - 1.7 cm exophytic right renal mass, which is suspicious for cortical neoplasm. Recommend dedicated MRI or CT (with renal protocol) for further evaluation.     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     Last pouchoscopy 07/2022:  Perianal exam notable for perianal fistula with                          active purulent drainage.                         - Rectal exam showed NO anal anastomotic stricture.                         - Rectal cuff was normal.                         - Mild inflammation in the pouch body with a several,                          shallow broad ulcers near the pouch inlet and at the                          pouch inlet                         - Neo-terminal ileum was normal for 40+ cm.                         - Ileo-ileal anastomosis was reached with likely                          chronic bowel dilation at the anastomotic site.        08/2018:   The perianal and digital rectal examinations were normal.       The neo-terminal ileum appeared normal. Biopsies were taken with a cold        forceps for histology.       Mild inflammation characterized by erosions was found in the distal        ileoanal pouch. This was graded as Pouchitis Disease Activity Index        (Endoscopic) Score 2. Biopsies were taken with a cold forceps for        histology.       There was a rectal cuff beginning at 1 cm from the anal verge,        characterized by healthy appearing mucosa. The cuff extended 2 cm in        length.    A: Pre-pouch, biopsy  - Small intestinal mucosa  with no specific pathologic abnormality     B: Pouch, biopsy  - Small intestinal mucosa with mild Paneth cell hyperplasia and focal mild superficial acute inflammation, negative for dysplasia  - No CMV cytopathic effect or granulomas identified  01/2015: No significant inflammation pouch, prepouch or cuff.     Pathology from his colectomy (2003):   Colon, colectomy  - Moderate chronic active colitis with morphologic features consistent with ulcerative colitis  - Distal margin of resection involved with chronic active colitis  - Acute serositis      Labs    Office Visit on 04/21/2024   Component Date Value Ref Range Status    Folate 04/21/2024 9.2  >=5.4 ng/mL Final    Vitamin B-12 04/21/2024 8,199 (H)  211 - 911 pg/ml Final    Iron 04/21/2024 74  65 - 175 ug/dL Final    TIBC 88/93/7974 293  250 - 425 ug/dL Final    Iron Saturation (%) 04/21/2024 25  20 - 55 % Final    Ferritin 04/21/2024 96.2  10.5 - 307.3 ng/mL Final    CRP 04/21/2024 <5.0  <=10.0 mg/L Final    Sodium 04/21/2024 144  135 - 145 mmol/L Final    Potassium 04/21/2024 4.3  3.4 - 4.8 mmol/L Final    Chloride 04/21/2024 107  98 - 107 mmol/L Final    CO2 04/21/2024 23.2  20.0 - 31.0 mmol/L Final    Anion Gap 04/21/2024 14  5 - 14 mmol/L Final    BUN 04/21/2024 15  9 - 23 mg/dL Final    Creatinine 88/93/7974 1.56 (H)  0.73 - 1.18 mg/dL Final    BUN/Creatinine Ratio 04/21/2024 10   Final    eGFR CKD-EPI (2021) Male 04/21/2024 50 (L)  >=60 mL/min/1.54m2 Final    Glucose 04/21/2024 112 (H)  70 - 99 mg/dL Final    Calcium 88/93/7974 9.9  8.7 - 10.4 mg/dL Final    Albumin 88/93/7974 4.1  3.4 - 5.0 g/dL Final    Total Protein 04/21/2024 7.5  5.7 - 8.2 g/dL Final    Total Bilirubin 04/21/2024 0.7  0.3 - 1.2 mg/dL Final    AST 88/93/7974 17  <=34 U/L Final    ALT 04/21/2024 12  10 - 49 U/L Final    Alkaline Phosphatase 04/21/2024 73  46 - 116 U/L Final    WBC 04/21/2024 4.6  3.6 - 11.2 10*9/L Final    RBC 04/21/2024 4.15 (L)  4.26 - 5.60 10*12/L Final    HGB 04/21/2024 12.3 (L)  12.9 - 16.5 g/dL Final    HCT 88/93/7974 36.9 (L)  39.0 - 48.0 % Final    MCV 04/21/2024 88.9  77.6 - 95.7 fL Final    MCH 04/21/2024 29.7  25.9 - 32.4 pg Final    MCHC 04/21/2024 33.4  32.0 - 36.0 g/dL Final    RDW 88/93/7974 15.3 (H)  12.2 - 15.2 % Final    MPV 04/21/2024 7.9  6.8 - 10.7 fL Final    Platelet 04/21/2024 180  150 - 450 10*9/L Final    Neutrophils % 04/21/2024 55.0  % Final    Lymphocytes % 04/21/2024 29.4  % Final    Monocytes % 04/21/2024 7.7  % Final    Eosinophils % 04/21/2024 7.1  % Final    Basophils % 04/21/2024 0.8  % Final    Absolute Neutrophils 04/21/2024 2.5  1.8 - 7.8 10*9/L Final    Absolute Lymphocytes 04/21/2024 1.3  1.1 -  3.6 10*9/L Final    Absolute Monocytes 04/21/2024 0.4  0.3 - 0.8 10*9/L Final    Absolute Eosinophils 04/21/2024 0.3  0.0 - 0.5 10*9/L Final    Absolute Basophils 04/21/2024 0.0  0.0 - 0.1 10*9/L Final        ASSESSMENT:        1. 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 Chronic antibiotics-dependent pouchitis. On chronic ciprofloxacin  therapy       Course of Disease:    Initially diagnosed with UC in 1995.    Between 1995 and 2002, he was treated with different 5-ASA compound, azathioprine, and systemic steroids.    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    Fast development of  chronic antibiotic dependent  pouchitis and maintained on ciprofloxacin  500 mg BID, sometimes add on metronidazole  .   07/2014 he underwent hernia repair and while hospitalized developed worsening diarrhea.  Treated with flagyl  for 14 days along with his chronic Cipro  therapy.  Responded initially but with recurrent symptoms as he completed the course.  Received a second round of flagyl  (3/27-4/5), then went off both Cipro  and flagyl  (ran out the prescription for Cipro ).  This resulted in worsening diarrhea.    10/04/2014 first presentation at the pouch clinic. He was started on Cipro  and flagyl  for 2 weeks, then alternate between the 2 for a week at a time.  Flagyl  no effect later on 01/2015 start alternating doxy and Cipro , attempted initiation of Hyoscyamine    04/2015 Cipro  mono due to no effectiveness of doxy, start of desipramine .   08/2014 his Desipramine  was tapered to 10 mg po daily, which he has tolerated much better. He is having 4-6 BM per day.    05/2017 doing well on the combination of Cipro , hyoscyamine , desipramine  low-dose 10 mg daily and Imodium    08/2018 severe diarrhea, switch to Entocort 9 mg and Cipro    10/2018 reoccurrence of diarrhea while on 6 mg of Entocort, increased bloating, Cipro  stops working on steroid taper 01/2019 desipramine  10 mg to amitriptyline  25 mg, cefdinir  1 tablet twice daily as well as continue the budesonide  9 mg daily, later on switch back to Cipro  twice daily   02/2019 steroid taper   03/2019 start of liraglutide  with decrease of bowel frequency as well as Levaquin .    04/2019 switch back to Cipro , budesonide  9 mg as well as continuation of liraglutide .  01/2020 E. coli sepsis, seizure, new onset of diabetes.   07/2020: Stopped Entocort due to new onset IDDM. Also developed severe joint pains that improved significantly after cutting back on Cipro  to 0.5 tab/day.  Later on trial of liraglutide  which seems to work.    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 But developed significant large joint pains and had to cut back on dose.  - Cefdinir : Responded well for a couple of weeks, then stopped working.  - Doxycycline : No response  - Flagyl : No response  - Bactrim, Augmentin: Not tried to due sulfa and penicillin allergies.  - Tinidazole : Prescribed but could not take due to cost issue.  - Vancomycin : Prescribed recently but could not take due to cost issue  - Xifaxan : responded well     Other medications:  - Entocort: Was on it for a long time, but had to stop due to new onset IDDM  - Using Imodium  prn    Pouch/ perianal abscess/ intersphincteric fistula: Continue Skyrizi  injections every 8 weeks which has helped resolve perianal drainage. He is stable on Cipro  once daily and Continue Lomotil  2 tabs QD -  BID as it prevents nocturnal awakenings.    If flare up plan to get a course of rifaximin .     Hidradenitis suppurative: Since age 24 recurrent boils axilla. Not a problem at this time.    Right papillary renal cell cancer: Cryotherapy in 09/2022, in follow-up     Stroke: Patient suffered hemorrhagic stroke end of June 2022, was hospitalized for 4 weeks with excellent rehabilitation.      Diabetes: Currently not on medication, last hemoglobin A1c 6.1      Labs : Mild anemia probably still recovering from sepsis (hemoglobin in hospital around 9). No iron deficiency,     Reflux: Currently no problem on PPI.    IBD health maintenance:  Influenza vaccine:  nd  Pneumonia vaccine: Prevnar 20 08/2022  Hepatitis B: pending  TB testing: pending  Chickenpox/Shingles history: 2nd Shingrix today  Bone densitometry:  nd       Recommendations:     Patient Instructions   VISIT SUMMARY:  Today, we reviewed your progress on Skyrizi  and discussed your recent hospitalization due to an ESBL E. coli infection. We also addressed your bowel frequency, anemia, and vitamin D levels.    YOUR PLAN:  CROHN'S DISEASE OF THE SMALL INTESTINE WITH POUCHITIS: Your bowel frequency has improved to four times a day. You are continuing your current medications with some adjustments for better symptom control.  -Continue Skyrizi  as scheduled.  -Continue Cipro  once daily.  -Continue Lomotil  once daily, with the option to increase to twice daily if needed.  -Continue hyoscyamine  once daily, with the option to increase to twice daily if needed.  -Refill provided for Bentyl  as needed.  -Refill provided for Lomotil .    ANEMIA: You have mild anemia noted during your recent hospitalization.  -We checked your hemoglobin levels today. No current need for iron supplementation.    VITAMIN D DEFICIENCY: You are continuing on vitamin D supplementation.  -We checked your vitamin D levels today.               [1]   Social History  Socioeconomic History    Marital status: Single     Spouse name: None    Number of children: None    Years of education: None    Highest education level: None   Tobacco Use    Smoking status: Never     Passive exposure: Never    Smokeless tobacco: Never   Substance and Sexual Activity    Alcohol use: Yes     Alcohol/week: 14.0 standard drinks of alcohol  Types: 14 Cans of beer per week    Drug use: No   Social History Narrative    Incarcerated in [employer] [facility].  No job currently.       Social Drivers of Psychologist, [employer] Strain: Low Risk  (03/30/2024)    Received from [facility]    Overall Financial Resource Strain (CARDIA)     How hard is it for you to pay for the very basics like food, housing, medical care, and heating?: Not very hard   Food Insecurity: Unknown (03/30/2024)    Received from [facility]    Hunger Vital Sign     Within the past 12 months, you worried that your food would run out before you got the money to buy more.: Never true     Within the past 12 months, the food you bought just didn't last and you didn't have money to get more.: Patient declined   Transportation Needs: Unmet Transportation Needs (03/30/2024)    Received from [facility] - Transportation     In the past 12 months, has lack of transportation kept you from medical appointments or from getting medications?: Yes     In the past 12 months, has lack of transportation kept you from meetings, work, or from getting things needed for daily living?: Yes   Physical Activity: Inactive (03/30/2024)    Received from [facility]    Exercise Vital Sign     On average, how many days per week do you engage in moderate to strenuous exercise (like a brisk walk)?: 0 days   Stress: Stress Concern Present (03/30/2024)    Received from [facility] - Occupational Stress Questionnaire     Do you feel stress - tense, restless, nervous, or anxious, or unable to sleep at night because your mind is troubled all the time - these days?: Rather much   Social Connections: Somewhat Isolated (03/30/2024)    Received from [facility]    Social Network     How would you rate your social network (family, work, friends)?: Restricted participation with some degree of social isolation   Housing: Unknown (03/30/2024)    Received from [facility] Stability Vital Sign     In the last 12 months, was there a time when you were not able to pay the mortgage or rent on time?: Patient declined     At any time in the past 12 months, were you homeless or living in a shelter (including now)?: No

## 2024-04-21 NOTE — Patient Instructions (Signed)
 VISIT SUMMARY:  Today, we reviewed your progress on Skyrizi  and discussed your recent hospitalization due to an ESBL E. coli infection. We also addressed your bowel frequency, anemia, and vitamin D levels.    YOUR PLAN:  CROHN'S DISEASE OF THE SMALL INTESTINE WITH POUCHITIS: Your bowel frequency has improved to four times a day. You are continuing your current medications with some adjustments for better symptom control.  -Continue Skyrizi  as scheduled.  -Continue Cipro  once daily.  -Continue Lomotil  once daily, with the option to increase to twice daily if needed.  -Continue hyoscyamine  once daily, with the option to increase to twice daily if needed.  -Refill provided for Bentyl  as needed.  -Refill provided for Lomotil .    ANEMIA: You have mild anemia noted during your recent hospitalization.  -We checked your hemoglobin levels today. No current need for iron supplementation.    VITAMIN D DEFICIENCY: You are continuing on vitamin D supplementation.  -We checked your vitamin D levels today.

## 2024-04-25 LAB — VITAMIN D 25 HYDROXY: VITAMIN D, TOTAL (25OH): 46.2 ng/mL (ref 20.0–80.0)

## 2024-04-27 NOTE — Progress Notes (Signed)
 Assessment & Plan   Assessment/Plan:  Assessment and Plan Assessment & Plan Type 2 diabetes mellitus with stage 3A chronic kidney disease Diabetes is worsening but remains glycemically controlled with an A1c of 6.3. Managed with lifestyle modifications. Stage 3A CKD with GFR of 50 is well-managed. Discussed potential benefits of GLP-1 agonists for diabetes management, weight loss, and possible improvement in ulcerative colitis and mood. GLP-1 agonists may also reduce inflammation and improve ulcerative colitis. - Checked microalbumin creatinine ratio to assess for proteinuria. - Started Ozempic 0.25 mg weekly for diabetes management and potential weight loss benefits.  Ulcerative colitis (chronic proctitis) Chronic ulcerative colitis managed with Skyrizi. Mild anemia associated with ulcerative colitis, but iron, B12, and folate levels are normal. Discussed potential benefits of GLP-1 agonists for reducing inflammation and improving ulcerative colitis. - Continue Skyrizi for ulcerative colitis management. - Continue follow-up with gastroenterology.  Depression with sleep disturbance and generalized anxiety disorder Depression with sleep disturbance and generalized anxiety disorder. Managed with escitalopram  20 mg and quetiapine  100 mg QHS. Reports difficulty attending behavioral health appointments due to life stressors. Experiences thoughts of being better off dead but no active suicidal ideation or plan. Discussed starting Avelity for resistant depression and anxiety, with potential rapid onset of action. Avelity may provide relief within a week and is taken twice daily after an initial week of once daily dosing. - Started Avelity for resistant depression and anxiety. - Increased quetiapine  to 150 mg QHS to improve sleep. - Referred to psychiatry for further management. - Provided resources for mental health support.  Essential hypertension Hypertension managed with amlodipine  10 mg daily,  hydralazine  100 mg three times daily, and lisinopril  5 mg daily. Lisinopril  also provides proteinuria protection. - Continue current antihypertensive regimen.  Mixed hyperlipidemia Managed with ezetimibe  10 mg daily. Myalgias to statins, specifically pravastatin. Discussed potential use of PCSK9 inhibitors as an alternative. Recommended fasting lipid panel and Apo A plus B plus ratio for cardiovascular risk stratification. - Ordered fasting lipid panel and Apo A plus B plus ratio. - Continue ezetimibe  10 mg daily. - Will consider PCSK9 inhibitors as an alternative to statins.  Obesity BMI has increased slightly. Discussed potential benefits of GLP-1 agonists for weight loss and overall health improvement. - Started Ozempic 0.25 mg weekly for weight loss benefits.  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 Pain is constant and exacerbated by stress. Currently using Celebrex  100 mg nightly for pain management. Discussed potential referral to oral surgery for TMJ evaluation and possible physical therapy. - Referred to oral surgery for TMJ evaluation. - Continue Celebrex  100 mg nightly for pain management.  Chronic joint pain and myalgia Managed with Celebrex  100 mg nightly. - Continue Celebrex  100 mg nightly for joint pain management.        There are no discontinued medications.  Return in about 1 month (around 05/28/2024) for mood.        Subjective:   Encounter date: 04/28/2024  Matthew Francis is a 63 y.o. male who has Essential hypertension; Ulcerative colitis (HCC); Sleep disorder; Hyperlipidemia; History of hemorrhagic cerebrovascular accident (CVA) with residual deficit; Muscle spasm; Gastroesophageal reflux disease; Acute right hip pain; Controlled type 2 diabetes mellitus with complication, without long-term current use of insulin  (HCC); Cocaine abuse (HCC); Alcohol abuse; Recurrent major  depressive disorder, in remission; Anxiety; Vitamin D  deficiency; B12 deficiency; Anemia; Lateral epicondylitis of left elbow; Ulnar neuropathy at elbow of right upper extremity; Vitreomacular adhesion  of both eyes; Type 2 diabetes mellitus with stage 3a chronic kidney disease, without long-term current use of insulin  (HCC); Nuclear sclerotic cataract of both eyes; Posterior subcapsular age-related cataract, both eyes; Renal lesion; Renal cell carcinoma (HCC); Onychomadesis of toenail; Right arm pain; Severe episode of recurrent major depressive disorder, without psychotic features (HCC); and Hymenoptera allergy on their problem list..   He  has a past medical history of Arthritis, 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 (02/09/2012), GERD (gastroesophageal reflux disease), H/O ulcerative colitis, Hyperlipidemia, Hypertension, ICH (intracerebral hemorrhage) (HCC) (12/11/2020), Inguinal hernia, Nontraumatic subcortical hemorrhage of brain (HCC), Severe sepsis (HCC) (01/17/2020), Thrombocytopenia (01/17/2020), Ulcer, Ulcerative colitis, UTI due to extended-spectrum beta lactamase (ESBL) producing Escherichia coli (01/19/2020), and Wears glasses.SABRA   He presents with chief complaint of Hypertension (3 month follow up. Pt is fasting today. Pt blood pressure reading at home range from 135/88//HM due- diabetic eye exam (referral is needed) ), Diabetes, and BEHAVORIAL HEALTH  (Pt is requested new referral due to anxiety ) .  Discussed the use of AI scribe software for clinical note transcription with the patient, who gave verbal consent to proceed.  History of Present Illness Matthew Francis Pronounced DIE LAN is a 63 year old male with diabetes, ulcerative colitis, depression, CKD 3A, and hyperlipidemia who presents for follow-up on chronic management.  Glycemic control - Diabetes mellitus managed with lifestyle modifications - Hemoglobin A1c 6.3, indicating glycemic control - Worsening of diabetes  symptoms today  Ulcerative colitis and associated anemia - Ulcerative colitis managed with Skyrizi - Recent gastroenterology labs show normal liver function tests - Mild anemia associated with ulcerative colitis - Hemoglobin 12.3 - Normal iron, B12, and folate levels  Depressive symptoms and medication adherence - Depression managed with escitalopram  20 mg daily and quetiapine  100 mg at bedtime - Unable to attend behavioral health appointments due to transportation and scheduling issues - Thoughts of self-harm without a plan - Forgets to take medications for several days at a time - Significant life stressors including car troubles and financial difficulties impacting mood and ability to attend appointments - Frustration with healthcare system and life circumstances ongoing since February  Chronic kidney disease - Chronic kidney disease stage 3A - Stable GFR of 50  Hyperlipidemia and statin intolerance - Hyperlipidemia managed with ezetimibe  10 mg daily - History of myalgias with statins, specifically pravastatin  Hypertension - Hypertension managed with amlodipine  10 mg daily, hydralazine  100 mg three times daily, and lisinopril  5 mg daily  Orofacial pain - Jaw pain attributed to nocturnal bruxism - Significant discomfort from jaw pain  Exercise intolerance and dyspnea - No chest pain, shortness of breath, or palpitations - Mild shortness of breath when walking uphill, attributed to lack of exercise      04/28/2024   11:40 AM 01/27/2024    3:21 PM 09/24/2023    3:56 PM 05/13/2022   11:53 AM  GAD 7 : Generalized Anxiety Score  Nervous, Anxious, on Edge 2 2 2 1   Control/stop worrying 3 2 3 1   Worry too much - different things 3 2 3 2   Trouble relaxing 3 3 3 2   Restless 2 1 1 1   Easily annoyed or irritable 1 1 2 2   Afraid - awful might happen 2 2 2 2   Total GAD 7 Score 16 13 16 11   Anxiety Difficulty Somewhat difficult Somewhat difficult Somewhat difficult Somewhat  difficult       04/28/2024   11:39 AM 01/27/2024  3:21 PM 11/19/2023    9:19 AM 09/24/2023    3:56 PM 05/13/2022   11:53 AM  Depression screen PHQ 2/9  Decreased Interest 2 2 3 3 1   Down, Depressed, Hopeless 3 2 2 2 1   PHQ - 2 Score 5 4 5 5 2   Altered sleeping 3 2 3 3 2   Tired, decreased energy 3 2 3 3 2   Change in appetite 2 1 3 3 1   Feeling bad or failure about yourself  3 2 2 2 2   Trouble concentrating 2 2 2 2 1   Moving slowly or fidgety/restless 1 0 1 1 1   Suicidal thoughts 2 1 0 1 0  PHQ-9 Score 21 14  19  20  11    Difficult doing work/chores Somewhat difficult Somewhat difficult Somewhat difficult Somewhat difficult Somewhat difficult     Data saved with a previous flowsheet row definition     ROS  Past Surgical History:  Procedure Laterality Date   APPENDECTOMY     COLECTOMY     Proctocolectomy with IPAA   COLECTOMY     FOREIGN BODY REMOVAL ABDOMINAL  02/17/2012   Procedure: REMOVAL FOREIGN BODY ABDOMINAL;  Surgeon: Krystal JINNY Russell, MD;  Location: [facility];  Service: General;  Laterality: N/A;  abdominal wound exploration and removal of foreign body   HERNIA REPAIR     Multiple incisional hernias.   ileoanal anastomosis      Current Outpatient Medications on File Prior to Visit  Medication Sig Dispense Refill   acetaminophen  (TYLENOL ) 325 MG tablet Take 2 tablets (650 mg total) by mouth every 6 (six) hours as needed for moderate pain or mild pain.     amLODipine  (NORVASC ) 10 MG tablet Take 1 tablet (10 mg total) by mouth daily. 90 tablet 0   ascorbic acid  (VITAMIN C) 500 MG tablet Take 1 tablet (500 mg total) by mouth daily. 30 tablet 0   celecoxib  (CELEBREX ) 50 MG capsule TAKE 1 CAPSULE(50 MG) BY MOUTH TWICE DAILY AS NEEDED FOR PAIN 180 capsule 0   cetirizine (ZYRTEC) 10 MG tablet Take 10 mg by mouth daily as needed for allergies.      Charcoal Activated (ACTIVATED CHARCOAL) POWD 1 Application by Other route.     Cholecalciferol (VITAMIN D3)  1.25 MG (50000 UT) CAPS Take 1 capsule (50,000 Units total) by mouth once a week. 4 capsule 5   ciprofloxacin  (CIPRO ) 500 MG tablet Take 500 mg by mouth.     diclofenac  Sodium (VOLTAREN ) 1 % GEL Apply 4 g topically 4 (four) times daily as needed. 100 g 3   dicyclomine  (BENTYL ) 10 MG capsule Take 10 mg by mouth in the morning, at noon, in the evening, and at bedtime.     EPINEPHrine  (EPIPEN  2-PAK) 0.3 mg/0.3 mL IJ SOAJ injection Inject 0.3 mg into the muscle as needed for anaphylaxis. 1 each 1   escitalopram  (LEXAPRO ) 20 MG tablet Take 1 tablet (20 mg total) by mouth daily. 90 tablet 0   ezetimibe  (ZETIA ) 10 MG tablet Take 1 tablet (10 mg total) by mouth daily. TAKE 1 TABLET(10 MG) BY MOUTH DAILY 90 tablet 3   famotidine  (PEPCID ) 40 MG tablet TAKE 1 TABLET(40 MG) BY MOUTH DAILY 90 tablet 0   fluconazole  (DIFLUCAN ) 150 MG tablet Take 1 tablet (150 mg total) by mouth once a week. 12 tablet 1   Glucosamine 500 MG CAPS Take by mouth.     hydrALAZINE  (APRESOLINE ) 100 MG tablet Take 1 tablet (  100 mg total) by mouth 3 (three) times daily. 270 tablet 3   hyoscyamine (LEVSIN) 0.125 MG tablet Take by mouth.     Krill Oil (OMEGA-3) 500 MG CAPS Take by mouth.     lisinopril  (ZESTRIL ) 5 MG tablet Take 1 tablet (5 mg total) by mouth daily. 90 tablet 3   loperamide  (IMODIUM ) 2 MG capsule Take 2 capsules (4 mg total) by mouth as needed for diarrhea or loose stools. 30 capsule 0   melatonin 3 MG TABS tablet Take 1 tablet (3 mg total) by mouth at bedtime. 30 tablet 0   Multiple Vitamin (MULTIVITAMIN WITH MINERALS) TABS Take 1 tablet by mouth daily.     Omega-3 Fatty Acids (FISH OIL ) 1000 MG CAPS Take by mouth.     ondansetron  (ZOFRAN -ODT) 4 MG disintegrating tablet Take 4 mg by mouth every 8 (eight) hours as needed.     pantoprazole  (PROTONIX ) 40 MG tablet TAKE 1 TABLET(40 MG) BY MOUTH DAILY 90 tablet 0   psyllium (METAMUCIL) 58.6 % packet Take 1 packet by mouth daily as needed (For fiber/thickener).       QUEtiapine  (SEROQUEL ) 100 MG tablet Take 1 tablet (100 mg total) by mouth at bedtime. 90 tablet 0   rifaximin  (XIFAXAN ) 550 MG TABS tablet Take 1 tablet (550 mg total) by mouth 2 (two) times daily. 42 tablet 4   SKYRIZI 360 MG/2.4ML SOCT Inject the contents of 1 cartridge (360mg ) under the skin every 8 weeks     Turmeric (QC TUMERIC COMPLEX PO) Take 1 tablet by mouth daily as needed (For inflammatory).     UNABLE TO FIND Med Name: IVERMETIN paste reported by patient OTC     vitamin B-12 (CYANOCOBALAMIN ) 100 MCG tablet Take 1 tablet (100 mcg total) by mouth daily. 30 tablet 0   cloNIDine  (CATAPRES ) 0.1 MG tablet TAKE 1 TABLET(0.1 MG) BY MOUTH THREE TIMES DAILY (Patient not taking: Reported on 04/28/2024) 90 tablet 11   methocarbamol  (ROBAXIN ) 500 MG tablet Take 500 mg by mouth. (Patient not taking: Reported on 04/28/2024)     No current facility-administered medications on file prior to visit.    Family History  Problem Relation Age of Onset   Neurodegenerative disease Mother    Cancer Father        prostate cancer   Cancer Sister    Diabetes Paternal Grandmother    Stroke Paternal Grandfather     Social History   Socioeconomic History   Marital status: Single    Spouse name: Not on file   Number of children: Not on file   Years of education: Not on file   Highest education level: Some college, no degree  Occupational History    Comment: restaurant   Occupation: [employer]  Tobacco Use   Smoking status: Never   Smokeless tobacco: Never  Vaping Use   Vaping status: Never Used  Substance and Sexual Activity   Alcohol use: Yes    Alcohol/week: 21.0 standard drinks of alcohol    Types: 21 Cans of beer per week   Drug use: Not Currently    Types: Cocaine   Sexual activity: Not Currently  Other Topics Concern   Not on file  Social History Narrative   Not on file   Social Drivers of Health   Financial Resource Strain: Low Risk  (03/30/2024)   Received from [facility]    Overall Financial Resource Strain (CARDIA)    How hard is it for you to pay for the  very basics like food, housing, medical care, and heating?: Not very hard  Food Insecurity: Unknown (03/30/2024)   Received from [facility]   Hunger Vital Sign    Within the past 12 months, you worried that your food would run out before you got the money to buy more.: Never true    Within the past 12 months, the food you bought just didn't last and you didn't have money to get more.: Patient declined  Transportation Needs: Unmet Transportation Needs (03/30/2024)   Received from [facility] - Transportation    In the past 12 months, has lack of transportation kept you from medical appointments or from getting medications?: Yes    In the past 12 months, has lack of transportation kept you from meetings, work, or from getting things needed for daily living?: Yes  Physical Activity: Inactive (03/30/2024)   Received from [facility]   Exercise Vital Sign    On average, how many days per week do you engage in moderate to strenuous exercise (like a brisk walk)?: 0 days    Minutes of Exercise per Session: Not on file  Stress: Stress Concern Present (03/30/2024)   Received from [facility] - Occupational Stress Questionnaire    Do you feel stress - tense, restless, nervous, or anxious, or unable to sleep at night because your mind is troubled all the time - these days?: Rather much  Social Connections: Somewhat Isolated (03/30/2024)   Received from [facility]   Social Network    How would you rate your social network (family, work, friends)?: Restricted participation with some degree of social isolation  Intimate Partner Violence: Not At Risk (03/30/2024)   Received from [facility]   HITS    Over the last 12 months how often did your partner physically hurt you?: Never    Over the last 12 months how often did your partner insult you or talk  down to you?: Patient declined    Over the last 12 months how often did your partner threaten you with physical harm?: Patient declined    Over the last 12 months how often did your partner scream or curse at you?: Patient declined                                                                                                  Objective:  Physical Exam: BP 128/82 (BP Location: Left Arm, Patient Position: Sitting, Cuff Size: Large)   Pulse 76   Temp (!) 97 F (36.1 C) (Temporal)   Resp 18   Wt 215 lb 6.4 oz (97.7 kg)   SpO2 100%   BMI 30.04 kg/m    Physical Exam           Physical Exam Constitutional:      Appearance: Normal appearance.  HENT:     Head: Normocephalic and atraumatic.     Right Ear: Hearing normal.     Left Ear: Hearing normal.     Nose: Nose normal.  Eyes:     General: No  scleral icterus.       Right eye: No discharge.        Left eye: No discharge.     Extraocular Movements: Extraocular movements intact.  Cardiovascular:     Rate and Rhythm: Normal rate and regular rhythm.     Heart sounds: Normal heart sounds.  Pulmonary:     Effort: Pulmonary effort is normal.     Breath sounds: Normal breath sounds.  Abdominal:     Palpations: Abdomen is soft.     Tenderness: There is no abdominal tenderness.  Skin:    General: Skin is warm.     Findings: No rash.  Neurological:     General: No focal deficit present.     Mental Status: He is alert.     Cranial Nerves: No cranial nerve deficit.  Psychiatric:        Mood and Affect: Mood normal.        Behavior: Behavior normal.        Thought Content: Thought content normal.        Judgment: Judgment normal.     No results found.  Recent Results (from the past 2160 hours)  POCT glycosylated hemoglobin (Hb A1C)     Status: Abnormal   Collection Time: 04/28/24 10:47 AM  Result Value Ref Range   Hemoglobin A1C 6.2 (A) 4.0 - 5.6 %   HbA1c POC (<> result, manual entry)     HbA1c, POC (prediabetic  range)     HbA1c, POC (controlled diabetic range)          Beverley KATHEE Hummer, MD  I,Emily Lagle,acting as a scribe for Beverley KATHEE Hummer, MD.,have documented all relevant documentation on the behalf of Beverley KATHEE Hummer, MD.  LILLETTE Beverley KATHEE Hummer, MD, have reviewed all documentation for this visit. The documentation on 04/28/2024 for the exam, diagnosis, procedures, and orders are all accurate and complete.

## 2024-04-28 ENCOUNTER — Encounter: Payer: Self-pay | Admitting: Family Medicine

## 2024-04-28 ENCOUNTER — Ambulatory Visit: Admitting: Family Medicine

## 2024-04-28 ENCOUNTER — Telehealth: Payer: Self-pay

## 2024-04-28 ENCOUNTER — Other Ambulatory Visit (HOSPITAL_COMMUNITY): Payer: Self-pay

## 2024-04-28 ENCOUNTER — Other Ambulatory Visit: Payer: Self-pay

## 2024-04-28 VITALS — BP 128/82 | HR 76 | Temp 97.0°F | Resp 18 | Wt 215.4 lb

## 2024-04-28 DIAGNOSIS — G8929 Other chronic pain: Secondary | ICD-10-CM

## 2024-04-28 DIAGNOSIS — E782 Mixed hyperlipidemia: Secondary | ICD-10-CM | POA: Diagnosis not present

## 2024-04-28 DIAGNOSIS — F411 Generalized anxiety disorder: Secondary | ICD-10-CM

## 2024-04-28 DIAGNOSIS — K5120 Ulcerative (chronic) proctitis without complications: Secondary | ICD-10-CM

## 2024-04-28 DIAGNOSIS — Z125 Encounter for screening for malignant neoplasm of prostate: Secondary | ICD-10-CM | POA: Diagnosis not present

## 2024-04-28 DIAGNOSIS — F332 Major depressive disorder, recurrent severe without psychotic features: Secondary | ICD-10-CM

## 2024-04-28 DIAGNOSIS — I10 Essential (primary) hypertension: Secondary | ICD-10-CM

## 2024-04-28 DIAGNOSIS — Z7985 Long-term (current) use of injectable non-insulin antidiabetic drugs: Secondary | ICD-10-CM | POA: Diagnosis not present

## 2024-04-28 DIAGNOSIS — T466X5A Adverse effect of antihyperlipidemic and antiarteriosclerotic drugs, initial encounter: Secondary | ICD-10-CM

## 2024-04-28 DIAGNOSIS — N1831 Chronic kidney disease, stage 3a: Secondary | ICD-10-CM

## 2024-04-28 DIAGNOSIS — Z23 Encounter for immunization: Secondary | ICD-10-CM

## 2024-04-28 DIAGNOSIS — E1122 Type 2 diabetes mellitus with diabetic chronic kidney disease: Secondary | ICD-10-CM | POA: Diagnosis not present

## 2024-04-28 DIAGNOSIS — M791 Myalgia, unspecified site: Secondary | ICD-10-CM

## 2024-04-28 DIAGNOSIS — G479 Sleep disorder, unspecified: Secondary | ICD-10-CM | POA: Diagnosis not present

## 2024-04-28 DIAGNOSIS — G4763 Sleep related bruxism: Secondary | ICD-10-CM

## 2024-04-28 LAB — POCT GLYCOSYLATED HEMOGLOBIN (HGB A1C): Hemoglobin A1C: 6.2 % — AB (ref 4.0–5.6)

## 2024-04-28 LAB — LIPID PANEL
Cholesterol: 233 mg/dL — ABNORMAL HIGH (ref 0–200)
HDL: 72.7 mg/dL (ref >39.00)
LDL Cholesterol: 129 mg/dL — ABNORMAL HIGH (ref 0–99)
NonHDL: 160.38
Total CHOL/HDL Ratio: 3
Triglycerides: 157 mg/dL — ABNORMAL HIGH (ref 0.0–149.0)
VLDL: 31.4 mg/dL (ref 0.0–40.0)

## 2024-04-28 LAB — MICROALBUMIN / CREATININE URINE RATIO
Creatinine,U: 117 mg/dL
Microalb Creat Ratio: 14.9 mg/g (ref 0.0–30.0)
Microalb, Ur: 1.7 mg/dL (ref 0.0–1.9)

## 2024-04-28 LAB — PSA: PSA: 3.8 ng/mL (ref 0.10–4.00)

## 2024-04-28 MED ORDER — CELECOXIB 100 MG PO CAPS
100.0000 mg | ORAL_CAPSULE | Freq: Two times a day (BID) | ORAL | 3 refills | Status: AC
Start: 2024-04-28 — End: 2025-04-23

## 2024-04-28 MED ORDER — BUPROPION HCL ER (XL) 150 MG PO TB24: 150.0000 mg | ORAL_TABLET | ORAL | 0 refills | Status: DC

## 2024-04-28 MED ORDER — OZEMPIC (0.25 OR 0.5 MG/DOSE) 2 MG/3ML ~~LOC~~ SOPN: 0.2500 mg | PEN_INJECTOR | SUBCUTANEOUS | 3 refills | Status: DC

## 2024-04-28 MED ORDER — QUETIAPINE FUMARATE 150 MG PO TABS: 150.0000 mg | ORAL_TABLET | Freq: Every day | ORAL | 0 refills | Status: DC

## 2024-04-28 MED ORDER — AUVELITY 45-105 MG PO TBCR: EXTENDED_RELEASE_TABLET | ORAL | 3 refills | Status: DC

## 2024-04-28 NOTE — Telephone Encounter (Signed)
 Received fax from patient's pharmacy for Auvelity 45-105 mg.   Message: Plan does not cover this medication. Please call [phone] to initiate prior authorization or call/fax pharmacy to change medication. Patient ID# is [id number]

## 2024-04-28 NOTE — Telephone Encounter (Signed)
 Pharmacy Patient Advocate Encounter   Received notification from Pt Calls Messages that prior authorization for (OZEMPIC, 0.25 OR 0.5 MG/DOSE,) 2 MG/3ML SOPN  is required/requested.   Insurance verification completed.   The patient is insured through [employer].   Per test claim: PA required; PA submitted to above mentioned insurance via Latent Key/confirmation #/EOC [id number] Status is pending

## 2024-04-28 NOTE — Addendum Note (Signed)
 Addended by: SEBASTIAN BEVERLEY NOVAK on: 04/28/2024 03:37 PM   Modules accepted: Orders

## 2024-04-28 NOTE — Patient Instructions (Addendum)
 It was very nice to see you today!  VISIT SUMMARY: Today, we discussed the management of your diabetes, ulcerative colitis, depression, chronic kidney disease, hyperlipidemia, hypertension, jaw pain, and overall health. We made some adjustments to your medications and discussed new treatment options to help manage your conditions more effectively.  YOUR PLAN: TYPE 2 DIABETES MELLITUS WITH STAGE 3A CHRONIC KIDNEY DISEASE: Your diabetes is worsening, but your blood sugar levels are still under control. Your chronic kidney disease is stable. -We checked your microalbumin creatinine ratio to assess for protein in your urine. -We started you on Ozempic 0.25 mg weekly to help manage your diabetes and assist with weight loss.  ULCERATIVE COLITIS (CHRONIC PROCTITIS): Your ulcerative colitis is being managed with Skyrizi. You have mild anemia, but your iron, B12, and folate levels are normal. -Continue taking Skyrizi for your ulcerative colitis. -Keep following up with your gastroenterologist.  DEPRESSION WITH SLEEP DISTURBANCE AND GENERALIZED ANXIETY DISORDER: You are experiencing depression and anxiety, and have had trouble attending behavioral health appointments. You have thoughts of self-harm but no active plan. -We started you on Avelity for your depression and anxiety. -We increased your quetiapine  to 150 mg at bedtime to help with sleep. -We referred you to psychiatry for further management. -We provided you with resources for mental health support.  ESSENTIAL HYPERTENSION: Your high blood pressure is being managed with your current medications. -Continue taking amlodipine  10 mg daily, hydralazine  100 mg three times daily, and lisinopril  5 mg daily.  MIXED HYPERLIPIDEMIA: You have high cholesterol and cannot tolerate statins due to muscle pain. -We ordered a fasting lipid panel and Apo A plus B plus ratio to better understand your cardiovascular risk. -Continue taking ezetimibe  10 mg  daily. -We will consider PCSK9 inhibitors as an alternative to statins.  OBESITY: Your BMI has increased slightly. -We started you on Ozempic 0.25 mg weekly to help with weight loss.  SLEEP RELATED BRUXISM WITH TEMPOROMANDIBULAR JOINT DISORDER (JAW PAIN): You have jaw pain likely due to grinding your teeth at night. -We referred you to oral surgery for a TMJ evaluation. -Continue taking Celebrex  100 mg nightly for pain management.  CHRONIC JOINT PAIN AND MYALGIA: You have ongoing joint pain and muscle aches. -Continue taking Celebrex  100 mg nightly for pain management.  Return in about 1 month (around 05/28/2024) for mood.   Take care, Arvella Hummer, MD, MS   PLEASE NOTE:  If you had any lab tests, please let us  know if you have not heard back within a few days. You may see your results on mychart before we have a chance to review them but we will give you a call once they are reviewed by us .   If we ordered any referrals today, please let us  know if you have not heard from their office within the next week.   If you had any urgent prescriptions sent in today, please check with the pharmacy within an hour of our visit to make sure the prescription was transmitted appropriately.   Please try these tips to maintain a healthy lifestyle:  Eat at least 3 REAL meals and 1-2 snacks per day.  Aim for no more than 5 hours between eating.  If you eat breakfast, please do so within one hour of getting up.   Each meal should contain half fruits/vegetables, one quarter protein, and one quarter carbs (no bigger than a computer mouse)  Cut down on sweet beverages. This includes juice, soda, and sweet tea.   Drink  at least 1 glass of water with each meal and aim for at least 8 glasses per day For urgent psychiatric assistance you can go to   [facility] Phone: [phone]  [address] [place], [place] [address]  Hours: Open 24/7, No appointment  required.  [facility]  [address] [place], [place] [address] Phone: [phone]  When you're going through tough times, it's easy to feel lonely and overwhelmed. Remember that YOU ARE NOT ALONE and we at [facility] want to support you during this difficult time.  There are many ways to get help and support when you are ready.  You can call the following help lines: - National suicide hotline ((709)478-3232) - National Hopeline Network (1-800-SUICIDE)   You can schedule an appointment with a team member with your primary care doc or one of our counselors.  We are all here to support you.  A doctor is on call 24/7   There are many treatments that can help people during difficult times, and we can talk with you about medications, counseling, diet, and other options. We want to offer you HOPE that it won't always feel this bad.   If you are seriously thinking about hurting yourself or have a plan, please call 911 or go to any emergency room right away for immediate help.    Exercise at least 150 minutes every week.

## 2024-04-28 NOTE — Telephone Encounter (Signed)
 Received a fax from patient's pharmacy for Ozempic 0.25 or 0.5 mg stating: Plan does not cover this medication. Please call [phone] to initiate prior authorization or call/fax pharmacy to change medication.

## 2024-04-29 ENCOUNTER — Other Ambulatory Visit (HOSPITAL_COMMUNITY): Payer: Self-pay

## 2024-04-29 NOTE — Telephone Encounter (Signed)
 Patient called and informed of the medication change. Patient verbalized understanding and all (if any) questions were answered.

## 2024-04-29 NOTE — Telephone Encounter (Signed)
 Pharmacy Patient Advocate Encounter  Received notification from [facility] MEDICAID that Prior Authorization for Ozempic 2mg /69ml has been APPROVED from 04/28/24 to 04/28/25   PA #/Case ID/Reference #: [id number]

## 2024-04-30 LAB — APO A1 + B + RATIO
Apolipo. B/A-1 Ratio: 0.5 ratio (ref 0.0–0.7)
Apolipoprotein A-1: 240 mg/dL — ABNORMAL HIGH (ref 101–178)
Apolipoprotein B: 131 mg/dL — ABNORMAL HIGH (ref <90)

## 2024-05-03 NOTE — Telephone Encounter (Signed)
 Tried calling patient and left a voice message per DPR on file asking to give me a call back at [phone]

## 2024-05-04 ENCOUNTER — Ambulatory Visit: Payer: Self-pay | Admitting: Family Medicine

## 2024-05-04 DIAGNOSIS — E782 Mixed hyperlipidemia: Secondary | ICD-10-CM

## 2024-05-04 MED ORDER — ROSUVASTATIN CALCIUM 5 MG PO TABS: 5.0000 mg | ORAL_TABLET | Freq: Every day | ORAL | 3 refills | Status: DC

## 2024-05-05 ENCOUNTER — Telehealth: Payer: Self-pay

## 2024-05-05 NOTE — Telephone Encounter (Signed)
 Copied from CRM [id number]. Topic: Referral - Status >> May 04, 2024 11:40 AM Revonda D wrote: Reason for CRM: Ludivina with [facility] is calling to verify if the referral for the pt was sent to them by accident. Ludivina stated that they specialize in teeth extractions, and implants. Ludivina stated that she doesn't see anything on the referral that shows they need to see the pt. Ludivina stated that they are going to delete the referral and if the pt does need to see them, the office can just resend it. [phone]

## 2024-05-05 NOTE — Telephone Encounter (Signed)
Please review and advise. Thanks. Dm/cma  

## 2024-05-10 NOTE — Telephone Encounter (Signed)
 they specialize in teeth extractions, and implants    Please change the referral.   Thanks. Dm/cma

## 2024-05-10 NOTE — Progress Notes (Signed)
 [facility] Home Delivery Pharmacy Refill Coordination Note    Specialty Medication(s) to be Shipped:   Inflammatory Disorders: Skyrizi     Other medication(s) to be shipped: No additional medications requested for fill at this time    Specialty Medications not needed at this time: N/A     Tony Andrade, DOB: August 10, 1960  Phone: [phone] (home)       All above HIPAA information was verified with patient.     Was a nurse, learning disability used for this call? No    Completed refill call assessment today to schedule patient's medication shipment from the [facility] Delivery Pharm[facility]  [phone]).  All relevant notes have been reviewed.     Specialty medication(s) and dose(s) confirmed: Regimen is correct and unchanged.   Changes to medications: Lydell reports no changes at this time.  Changes to insurance: No  New side effects reported not previously addressed with a pharmacist or physician: None reported  Questions for the pharmacist: No    Confirmed patient received a Conservation Officer, Historic Buildings and a Surveyor, Mining with first shipment. The patient will receive a drug information handout for each medication shipped and additional FDA Medication Guides as required.       DISEASE/MEDICATION-SPECIFIC INFORMATION        For patients on injectable medications: Next injection is scheduled for 12/15.    SPECIALTY MEDICATION ADHERENCE     Medication Adherence    Patient reported X missed doses in the last month: 0  Specialty Medication: SKYRIZI  360 mg/2.4 mL  Patient is on additional specialty medications: No              Were doses missed due to medication being on hold? No    Skyrizi  360/2.4 mg/ml: 0 doses of medicine on hand        REFERRAL TO PHARMACIST     Referral to the pharmacist: Not needed      [facility]     Shipping address confirmed in Epic.     Cost and Payment: Patient has a $0 copay, payment information is not required.    Delivery Scheduled: Yes, Expected medication delivery date: 05/24/24. Medication will be delivered via UPS to the prescription address in Epic WAM.    [name]   [facility]  Specialty Technician

## 2024-05-16 ENCOUNTER — Telehealth: Payer: Self-pay

## 2024-05-16 NOTE — Telephone Encounter (Signed)
 LVM for pt to call and make a appointment for oral surgery   Gustav BIRCH [facility] [address] [place] [address] [phone]

## 2024-05-16 NOTE — Telephone Encounter (Signed)
 Spoke with pt gave him referral information for him to make appointment.

## 2024-05-17 ENCOUNTER — Telehealth: Payer: Self-pay

## 2024-05-17 NOTE — Telephone Encounter (Signed)
Unable to leave VM due to mailbox is full. Will try later.  Dm/cma  

## 2024-05-17 NOTE — Telephone Encounter (Signed)
 Copied from CRM #[id number]. Topic: Referral - Question >> May 17, 2024  1:53 PM Lonell PEDLAR wrote: Reason for CRM: Patient returning call. States that he is waiting on someone to provide infor for therapist referral. Please c/b to advise. [phone]

## 2024-05-23 MED FILL — SKYRIZI 360 MG/2.4 ML (150 MG/ML) SUBCUTANEOUS WEARABLE INJECTOR: SUBCUTANEOUS | 56 days supply | Qty: 2.4 | Fill #2

## 2024-05-23 NOTE — Telephone Encounter (Signed)
 Atc patient to inform him of message below, unable to speak to patient. LVMTCB

## 2024-05-26 ENCOUNTER — Ambulatory Visit: Admitting: Family Medicine

## 2024-05-26 ENCOUNTER — Encounter: Payer: Self-pay | Admitting: Family Medicine

## 2024-05-26 VITALS — BP 111/76 | HR 74 | Temp 98.3°F | Ht 71.0 in | Wt 214.2 lb

## 2024-05-26 DIAGNOSIS — N1831 Chronic kidney disease, stage 3a: Secondary | ICD-10-CM

## 2024-05-26 DIAGNOSIS — G479 Sleep disorder, unspecified: Secondary | ICD-10-CM

## 2024-05-26 DIAGNOSIS — F419 Anxiety disorder, unspecified: Secondary | ICD-10-CM

## 2024-05-26 DIAGNOSIS — I10 Essential (primary) hypertension: Secondary | ICD-10-CM

## 2024-05-26 DIAGNOSIS — K219 Gastro-esophageal reflux disease without esophagitis: Secondary | ICD-10-CM

## 2024-05-26 DIAGNOSIS — M791 Myalgia, unspecified site: Secondary | ICD-10-CM

## 2024-05-26 DIAGNOSIS — E559 Vitamin D deficiency, unspecified: Secondary | ICD-10-CM

## 2024-05-26 DIAGNOSIS — E782 Mixed hyperlipidemia: Secondary | ICD-10-CM

## 2024-05-26 DIAGNOSIS — F332 Major depressive disorder, recurrent severe without psychotic features: Secondary | ICD-10-CM

## 2024-05-26 MED ORDER — ESCITALOPRAM OXALATE 20 MG PO TABS: 20.0000 mg | ORAL_TABLET | Freq: Every day | ORAL | 3 refills | Status: DC

## 2024-05-26 MED ORDER — VITAMIN D3 1.25 MG (50000 UT) PO CAPS: 50000.0000 [IU] | ORAL_CAPSULE | ORAL | 3 refills | Status: AC

## 2024-05-26 MED ORDER — REPATHA SURECLICK 140 MG/ML ~~LOC~~ SOAJ: 140.0000 mg | SUBCUTANEOUS | 3 refills | Status: DC

## 2024-05-26 MED ORDER — OZEMPIC (0.25 OR 0.5 MG/DOSE) 2 MG/3ML ~~LOC~~ SOPN: 0.5000 mg | PEN_INJECTOR | SUBCUTANEOUS | 3 refills | Status: AC

## 2024-05-26 MED ORDER — BUPROPION HCL ER (XL) 300 MG PO TB24: 300.0000 mg | ORAL_TABLET | ORAL | 3 refills | Status: DC

## 2024-05-26 MED ORDER — AMLODIPINE BESYLATE 10 MG PO TABS: 10.0000 mg | ORAL_TABLET | Freq: Every day | ORAL | 3 refills | Status: DC

## 2024-05-26 MED ORDER — QUETIAPINE FUMARATE 150 MG PO TABS: 150.0000 mg | ORAL_TABLET | Freq: Every day | ORAL | 3 refills | Status: DC

## 2024-05-26 MED ORDER — FAMOTIDINE 40 MG PO TABS: ORAL_TABLET | ORAL | 3 refills | Status: DC

## 2024-05-26 NOTE — Patient Instructions (Addendum)
 It was very nice to see you today!     Return in about 3 months (around 08/24/2024) for DM.   Take care, Arvella Hummer, MD, MS   PLEASE NOTE:  If you had any lab tests, please let us  know if you have not heard back within a few days. You may see your results on mychart before we have a chance to review them but we will give you a call once they are reviewed by us .   If we ordered any referrals today, please let us  know if you have not heard from their office within the next week.   If you had any urgent prescriptions sent in today, please check with the pharmacy within an hour of our visit to make sure the prescription was transmitted appropriately.   Please try these tips to maintain a healthy lifestyle:  Eat at least 3 REAL meals and 1-2 snacks per day.  Aim for no more than 5 hours between eating.  If you eat breakfast, please do so within one hour of getting up.   Each meal should contain half fruits/vegetables, one quarter protein, and one quarter carbs (no bigger than a computer mouse)  Cut down on sweet beverages. This includes juice, soda, and sweet tea.   Drink at least 1 glass of water with each meal and aim for at least 8 glasses per day  Exercise at least 150 minutes every week.

## 2024-05-26 NOTE — Progress Notes (Signed)
 Assessment & Plan   Assessment/Plan:    Assessment and Plan Assessment & Plan Major depressive disorder, recurrent, severe Mood has improved slightly but remains suboptimal. No suicidal ideation. Current medications include escitalopram  20 mg and bupropion  150 mg. Bupropion  has been beneficial for energy levels. He expresses interest in increasing bupropion  dosage for further improvement. - Increased bupropion  to 300 mg once daily in the morning. - Continued escitalopram  20 mg daily. - Provided referral information for behavioral health services.  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 Stage 3a chronic kidney disease noted with a recent change in creatinine levels. Semaglutide  (Ozempic ) is being used for diabetes management and provides kidney protection. - Increased semaglutide  (Ozempic ) from 0.25 mg to 0.5 mg weekly. - Continue monitoring kidney function and diabetes control.  Essential hypertension Blood pressure is well-controlled at 111/76 mmHg. Improvement may be related to mood stabilization. - Continue current antihypertensive regimen.  Mixed hyperlipidemia with statin-induced myalgia Statin-induced myalgia with significant arm pain. Rosuvastatin  5 mg was discontinued due to adverse effects. He is interested in trying PCSK9 inhibitor therapy. - Discontinued rosuvastatin . - Initiated Repatha  (PCSK9 inhibitor) 140 mg injection every two weeks.  Sleep disorder Sleep has improved slightly with quetiapine  150 mg at night. - Continue quetiapine  150 mg at night.  Anxiety disorder - Continue current medication regimen.         Medications Discontinued During This Encounter  Medication Reason   cloNIDine  (CATAPRES ) 0.1 MG tablet    QUEtiapine  150 MG TABS    rosuvastatin  (CRESTOR ) 5 MG tablet    methocarbamol  (ROBAXIN ) 500 MG tablet    fluconazole  (DIFLUCAN ) 150 MG tablet    escitalopram   (LEXAPRO ) 20 MG tablet Reorder   amLODipine  (NORVASC ) 10 MG tablet Reorder   Semaglutide ,0.25 or 0.5MG /DOS, (OZEMPIC , 0.25 OR 0.5 MG/DOSE,) 2 MG/3ML SOPN Reorder   buPROPion  (WELLBUTRIN  XL) 150 MG 24 hr tablet    Cholecalciferol (VITAMIN D3) 1.25 MG (50000 UT) CAPS Reorder   famotidine  (PEPCID ) 40 MG tablet Reorder   UNABLE TO FIND     Return in about 3 months (around 08/24/2024) for DM.        Subjective:   Encounter date: 05/26/2024  Matthew Francis is a 63 y.o. male who has Essential hypertension; Ulcerative colitis (HCC); Sleep disorder; Hyperlipidemia; Muscle spasm; Gastroesophageal reflux disease; Acute right hip pain; Controlled type 2 diabetes mellitus with complication, without long-term current use of insulin  (HCC); Anxiety; Vitamin D  deficiency; B12 deficiency; Anemia; Type 2 diabetes mellitus with stage 3a chronic kidney disease, without long-term current use of insulin  (HCC); Renal cell carcinoma (HCC); Onychomadesis of toenail; Right arm pain; Severe episode of recurrent major depressive disorder, without psychotic features (HCC); and Hymenoptera allergy on their problem list..   He  has a past medical history of Alcohol abuse (11/12/2021), Arthritis, Cocaine abuse (HCC) (11/12/2021), 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 (02/09/2012), GERD (gastroesophageal reflux disease), H/O ulcerative colitis, History of hemorrhagic cerebrovascular accident (CVA) with residual deficit (11/12/2021), Hyperlipidemia, Hypertension, ICH (intracerebral hemorrhage) (HCC) (12/11/2020), Inguinal hernia, Lateral epicondylitis of left elbow (01/03/2022), Nontraumatic subcortical hemorrhage of brain [facility]), Nuclear sclerotic cataract of both eyes (03/10/2022), Posterior subcapsular age-related cataract, both eyes (03/10/2022), Severe sepsis (HCC) (01/17/2020), Thrombocytopenia (01/17/2020), Ulcer, Ulcerative colitis, 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 Has questions he wants to ask directly to the provider.) .   Discussed the use of AI scribe software for clinical note transcription with the patient, who gave verbal consent to proceed.  History of Present Illness Matthew Francis is a 63 year old male with hypertension and depression who presents for a blood pressure follow-up and medication refill.  Blood pressure control - Blood pressure today is 111/76 mmHg. - No symptoms of hypotension or hypertension reported.  Mood disturbance and sleep - Depression with slight improvement in mood and energy levels on escitalopram  20 mg and bupropion  150 mg. - No suicidal ideation despite holiday-related stress. - Awaiting contact from [facility] for mental health referral. - Seroquel  150 mg at night has slightly improved sleep. - Vivid dreams associated with current medications. - Recent stress related to previous vehicle issues, now alleviated after acquiring a new, reliable car.  Musculoskeletal symptoms - Elbow joint pain attributed to rosuvastatin  use. - Discontinued rosuvastatin  due to side effects.  Gastrointestinal symptoms - On Skyrizi for gastrointestinal issues. - No recent bowel movements, which is a new symptom.  Appetite and weight changes - On Ozempic , resulting in significantly reduced appetite and eating only once daily. - No significant weight loss despite decreased oral intake.  Diabetes mellitus and renal function - History of diabetes mellitus. - Stage 3A chronic kidney disease based on November laboratory results.   ROS  Past Surgical History:  Procedure Laterality Date   APPENDECTOMY     COLECTOMY      Proctocolectomy with IPAA   COLECTOMY     FOREIGN BODY REMOVAL ABDOMINAL  02/17/2012   Procedure: REMOVAL FOREIGN BODY ABDOMINAL;  Surgeon: Krystal JINNY Russell, MD;  Location: [facility];  Service: General;  Laterality: N/A;  abdominal wound exploration and removal of foreign body   HERNIA REPAIR     Multiple incisional hernias.   ileoanal anastomosis      Medications Ordered Prior to Encounter[1]  Family History  Problem Relation Age of Onset   Neurodegenerative disease Mother    Cancer Father        prostate cancer   Cancer Sister    Diabetes Paternal Grandmother    Stroke Paternal Grandfather     Social History   Socioeconomic History   Marital status: Single    Spouse name: Not on file   Number of children: Not on file   Years of education: Not on file   Highest education level: Some college, no degree  Occupational History    Comment: restaurant   Occupation: [employer]  Tobacco Use   Smoking status: Never   Smokeless tobacco: Never  Vaping Use   Vaping status: Never Used  Substance and Sexual Activity   Alcohol use: Yes    Alcohol/week: 21.0 standard drinks of alcohol    Types: 21 Cans of beer per week   Drug use: Not Currently    Types: Cocaine   Sexual activity: Not Currently  Other Topics Concern   Not on file  Social History Narrative   Not on file   Social Drivers of Health   Tobacco Use: Low Risk (04/28/2024)   Patient History    Smoking Tobacco Use: Never    Smokeless Tobacco Use: Never    Passive Exposure: Not on file  Financial Resource Strain: Low Risk (03/30/2024)   Received from  [facility]   Overall Financial Resource Strain (CARDIA)    How hard is it for you to pay for the very basics like food, housing, medical care, and heating?: Not very hard  Food Insecurity: Unknown (03/30/2024)   Received from [facility]   Epic    Within the past 12 months, you worried that your food would run out before you got the money to buy  more.: Never true    Within the past 12 months, the food you bought just didn't last and you didn't have money to get more.: Patient declined  Transportation Needs: Unmet Transportation Needs (03/30/2024)   Received from [facility]    In the past 12 months, has lack of transportation kept you from medical appointments or from getting medications?: Yes    In the past 12 months, has lack of transportation kept you from meetings, work, or from getting things needed for daily living?: Yes  Physical Activity: Inactive (03/30/2024)   Received from [facility]   Exercise Vital Sign    On average, how many days per week do you engage in moderate to strenuous exercise (like a brisk walk)?: 0 days    Minutes of Exercise per Session: Not on file  Stress: Stress Concern Present (03/30/2024)   Received from [facility] - Occupational Stress Questionnaire    Do you feel stress - tense, restless, nervous, or anxious, or unable to sleep at night because your mind is troubled all the time - these days?: Rather much  Social Connections: Somewhat Isolated (03/30/2024)   Received from [facility]   Social Network    How would you rate your social network (family, work, friends)?: Restricted participation with some degree of social isolation  Intimate Partner Violence: Not At Risk (03/30/2024)   Received from [facility]   HITS    Over the last 12 months how often did your partner physically hurt you?: Never    Over the last 12 months how often did your partner insult you or talk down to you?: Patient declined    Over the last 12 months how often did your partner threaten you with physical harm?: Patient declined    Over the last 12 months how often did your partner scream or curse at you?: Patient declined  Depression (PHQ2-9): High Risk (04/28/2024)   Depression (PHQ2-9)    PHQ-2 Score: 21  Alcohol Screen: Low Risk (11/15/2023)   Alcohol Screen     Last Alcohol Screening Score (AUDIT): 7  Housing: Unknown (03/30/2024)   Received from [facility]    In the last 12 months, was there a time when you were not able to pay the mortgage or rent on time?: Patient declined    Number of Times Moved in the Last Year: Not on file    At any time in the past 12 months, were you homeless or living in a shelter (including now)?: No  Utilities: Not At Risk (03/30/2024)   Received from [facility]    In the past 12 months has the electric, gas, oil, or water company threatened to shut off services in your home?: No  Health Literacy: Not on file  Objective:  Physical Exam: BP 111/76   Pulse 74   Temp 98.3 F (36.8 C)   Ht 5' 11 (1.803 m)   Wt 214 lb 3.2 oz (97.2 kg)   SpO2 97%   BMI 29.87 kg/m   Wt Readings from Last 3 Encounters:  05/26/24 214 lb 3.2 oz (97.2 kg)  04/28/24 215 lb 6.4 oz (97.7 kg)  12/15/23 210 lb 12.8 oz (95.6 kg)    Physical Exam           Physical Exam Constitutional:      Appearance: Normal appearance.  HENT:     Head: Normocephalic and atraumatic.     Right Ear: Hearing normal.     Left Ear: Hearing normal.     Nose: Nose normal.  Eyes:     General: No scleral icterus.       Right eye: No discharge.        Left eye: No discharge.     Extraocular Movements: Extraocular movements intact.  Cardiovascular:     Rate and Rhythm: Normal rate and regular rhythm.     Heart sounds: Normal heart sounds.  Pulmonary:     Effort: Pulmonary effort is normal.     Breath sounds: Normal breath sounds.  Abdominal:     Palpations: Abdomen is soft.     Tenderness: There is no abdominal tenderness.  Skin:    General: Skin is warm.     Findings: No rash.  Neurological:     General: No focal deficit present.     Mental Status: He is alert.     Cranial Nerves: No cranial nerve deficit.   Psychiatric:        Mood and Affect: Mood normal.        Behavior: Behavior normal.        Thought Content: Thought content normal.        Judgment: Judgment normal.     No results found.  Recent Results (from the past 2160 hours)  POCT glycosylated hemoglobin (Hb A1C)     Status: Abnormal   Collection Time: 04/28/24 10:47 AM  Result Value Ref Range   Hemoglobin A1C 6.2 (A) 4.0 - 5.6 %   HbA1c POC (<> result, manual entry)     HbA1c, POC (prediabetic range)     HbA1c, POC (controlled diabetic range)    Apo A1 + B + Ratio     Status: Abnormal   Collection Time: 04/28/24 11:37 AM  Result Value Ref Range   Apolipoprotein A-1 240 (H) 101 - 178 mg/dL   Apolipoprotein B 868 (H) <90 mg/dL    Comment:                          Desirable               < 90                          Borderline High     90 -  99                          High               100 - 130                          Very High               >  130      --------------------------------------------------           ASCVD RISK              THERAPEUTIC TARGET            CATEGORY                  APO B (mg/dL)         Very High Risk        <80 (if extreme risk <70)         High Risk             <90         Moderate Risk         <90    Apolipo. B/A-1 Ratio 0.5 0.0 - 0.7 ratio    Comment:                              Apolipoprotein B/A-1 Ratio                                            Male  Male                                   Avg.Risk  0.7   0.6                                2X Avg.Risk  0.9   0.9                                3X Avg.Risk  1.0   1.0   Microalbumin / creatinine urine ratio     Status: None   Collection Time: 04/28/24 11:37 AM  Result Value Ref Range   Microalb, Ur 1.7 0.0 - 1.9 mg/dL   Creatinine,U 882.9 mg/dL   Microalb Creat Ratio 14.9 0.0 - 30.0 mg/g  PSA     Status: None   Collection Time: 04/28/24 11:37 AM  Result Value Ref Range   PSA 3.80 0.10 - 4.00 ng/mL    Comment: Test performed  using Access Hybritech PSA Assay, a parmagnetic partical, chemiluminecent immunoassay.  Lipid panel     Status: Abnormal   Collection Time: 04/28/24 11:37 AM  Result Value Ref Range   Cholesterol 233 (H) 0 - 200 mg/dL    Comment: ATP III Classification       Desirable:  < 200 mg/dL               Borderline High:  200 - 239 mg/dL          High:  > = 759 mg/dL   Triglycerides 842.9 (H) 0.0 - 149.0 mg/dL    Comment: Normal:  <849 mg/dLBorderline High:  150 - 199 mg/dL   HDL 27.29 >60.99 mg/dL   VLDL 68.5 0.0 - 59.9 mg/dL   LDL Cholesterol 870 (H) 0 - 99 mg/dL   Total CHOL/HDL Ratio 3     Comment:                Men  Women1/2 Average Risk     3.4          3.3Average Risk          5.0          4.42X Average Risk          9.6          7.13X Average Risk          15.0          11.0                       NonHDL 160.38     Comment: NOTE:  Non-HDL goal should be 30 mg/dL higher than patient's LDL goal (i.e. LDL goal of < 70 mg/dL, would have non-HDL goal of < 100 mg/dL)        Beverley KATHEE Hummer, MD  I,Emily Lagle,acting as a scribe for Beverley KATHEE Hummer, MD.,have documented all relevant documentation on the behalf of Beverley KATHEE Hummer, MD.  LILLETTE Beverley KATHEE Hummer, MD, have reviewed all documentation for this visit. The documentation on 05/26/2024 for the exam, diagnosis, procedures, and orders are all accurate and complete.     [1]  Current Outpatient Medications on File Prior to Visit  Medication Sig Dispense Refill   acetaminophen  (TYLENOL ) 325 MG tablet Take 2 tablets (650 mg total) by mouth every 6 (six) hours as needed for moderate pain or mild pain.     ascorbic acid  (VITAMIN C) 500 MG tablet Take 1 tablet (500 mg total) by mouth daily. 30 tablet 0   celecoxib  (CELEBREX ) 100 MG capsule Take 1-2 capsules (100-200 mg total) by mouth 2 (two) times daily. 360 capsule 3   cetirizine (ZYRTEC) 10 MG tablet Take 10 mg by mouth daily as needed for allergies.      Charcoal Activated (ACTIVATED  CHARCOAL) POWD 1 Application by Other route.     ciprofloxacin  (CIPRO ) 500 MG tablet Take 500 mg by mouth.     diclofenac  Sodium (VOLTAREN ) 1 % GEL Apply 4 g topically 4 (four) times daily as needed. 100 g 3   dicyclomine  (BENTYL ) 10 MG capsule Take 10 mg by mouth in the morning, at noon, in the evening, and at bedtime.     EPINEPHrine  (EPIPEN  2-PAK) 0.3 mg/0.3 mL IJ SOAJ injection Inject 0.3 mg into the muscle as needed for anaphylaxis. 1 each 1   ezetimibe  (ZETIA ) 10 MG tablet Take 1 tablet (10 mg total) by mouth daily. TAKE 1 TABLET(10 MG) BY MOUTH DAILY 90 tablet 3   Glucosamine 500 MG CAPS Take by mouth.     hydrALAZINE  (APRESOLINE ) 100 MG tablet Take 1 tablet (100 mg total) by mouth 3 (three) times daily. 270 tablet 3   hyoscyamine (LEVSIN) 0.125 MG tablet Take by mouth.     Krill Oil (OMEGA-3) 500 MG CAPS Take by mouth.     lisinopril  (ZESTRIL ) 5 MG tablet Take 1 tablet (5 mg total) by mouth daily. 90 tablet 3   loperamide  (IMODIUM ) 2 MG capsule Take 2 capsules (4 mg total) by mouth as needed for diarrhea or loose stools. 30 capsule 0   melatonin 3 MG TABS tablet Take 1 tablet (3 mg total) by mouth at bedtime. 30 tablet 0   Multiple Vitamin (MULTIVITAMIN WITH MINERALS) TABS Take 1 tablet by mouth daily.     Omega-3 Fatty Acids (FISH OIL ) 1000 MG CAPS Take by mouth.     ondansetron  (ZOFRAN -ODT) 4 MG disintegrating tablet  Take 4 mg by mouth every 8 (eight) hours as needed.     pantoprazole  (PROTONIX ) 40 MG tablet TAKE 1 TABLET(40 MG) BY MOUTH DAILY 90 tablet 0   psyllium (METAMUCIL) 58.6 % packet Take 1 packet by mouth daily as needed (For fiber/thickener).      rifaximin  (XIFAXAN ) 550 MG TABS tablet Take 1 tablet (550 mg total) by mouth 2 (two) times daily. 42 tablet 4   SKYRIZI 360 MG/2.4ML SOCT Inject the contents of 1 cartridge (360mg ) under the skin every 8 weeks     Turmeric (QC TUMERIC COMPLEX PO) Take 1 tablet by mouth daily as needed (For inflammatory).     vitamin B-12  (CYANOCOBALAMIN ) 100 MCG tablet Take 1 tablet (100 mcg total) by mouth daily. 30 tablet 0   No current facility-administered medications on file prior to visit.

## 2024-05-27 ENCOUNTER — Telehealth: Payer: Self-pay

## 2024-05-27 ENCOUNTER — Other Ambulatory Visit (HOSPITAL_COMMUNITY): Payer: Self-pay

## 2024-05-27 NOTE — Telephone Encounter (Signed)
 Good morning,   Pt needs a PA for the Evolocumab [facility] SURECLICK) 140 MG/ML SOAJ [[id number]]  Thank you,  [facility]

## 2024-05-30 ENCOUNTER — Telehealth: Payer: Self-pay

## 2024-05-30 NOTE — Telephone Encounter (Signed)
° ° ° °  Determination:  Sent to [employer]

## 2024-05-30 NOTE — Telephone Encounter (Signed)
 Pharmacy Patient Advocate Encounter  Received notification from [facility] MEDICAID  that Prior Authorization for Repatha  has been DENIED.  Full denial letter will be uploaded to the media tab. See denial reason below.      Please note that our office does see that the pt has trialed and failed statin medications and has adverse effects. Therefore we will attempt an Appeal to see if the medication will get approve.

## 2024-05-30 NOTE — Telephone Encounter (Signed)
Unable to leave VM due to mailbox is full. Will try later.  Dm/cma  

## 2024-05-31 ENCOUNTER — Telehealth: Payer: Self-pay | Admitting: Pharmacist

## 2024-05-31 DIAGNOSIS — T466X5A Adverse effect of antihyperlipidemic and antiarteriosclerotic drugs, initial encounter: Secondary | ICD-10-CM | POA: Insufficient documentation

## 2024-05-31 MED ORDER — AMLODIPINE BESYLATE 10 MG PO TABS: 10.0000 mg | ORAL_TABLET | Freq: Every day | ORAL | 3 refills | Status: AC

## 2024-05-31 MED ORDER — QUETIAPINE FUMARATE 150 MG PO TABS: 150.0000 mg | ORAL_TABLET | Freq: Every day | ORAL | 3 refills | Status: AC

## 2024-05-31 MED ORDER — FAMOTIDINE 40 MG PO TABS: ORAL_TABLET | ORAL | 3 refills | Status: AC

## 2024-05-31 MED ORDER — ESCITALOPRAM OXALATE 20 MG PO TABS: 20.0000 mg | ORAL_TABLET | Freq: Every day | ORAL | 3 refills | Status: AC

## 2024-05-31 MED ORDER — BUPROPION HCL ER (XL) 300 MG PO TB24: 300.0000 mg | ORAL_TABLET | ORAL | 3 refills | Status: AC

## 2024-05-31 MED ORDER — REPATHA SURECLICK 140 MG/ML ~~LOC~~ SOAJ: 140.0000 mg | SUBCUTANEOUS | 3 refills | Status: AC

## 2024-05-31 NOTE — Telephone Encounter (Signed)
 Hey Dr. Sebastian,    We received a request for a PA for Repatha . Patient PA denied. Please see message below.

## 2024-05-31 NOTE — Telephone Encounter (Signed)
 Appeal has been submitted. Will advise when response is received, please be advised that most companies may take 30 days to make a decision. Appeal letter and supporting documentation have been faxed to [phone] on 05/31/2024 @11 :54 am.  Thank you, Devere Pandy, PharmD Clinical Pharmacist  [place]  Direct Dial: [phone]

## 2024-06-01 ENCOUNTER — Telehealth: Payer: Self-pay

## 2024-06-01 NOTE — Telephone Encounter (Signed)
 Copied from CRM #[id number]. Topic: Clinical - Medication Question >> May 31, 2024  2:15 PM Nessti S wrote: Reason for CRM: felicia called in because she wanted to know if pt had a trial and error with generic Lipitor and generic crestor  documented. info can be faxed to [phone].

## 2024-06-01 NOTE — Telephone Encounter (Signed)
 Please review and advise. Request for documentation if patient has tried generic Lipitor and Crestor .

## 2024-06-02 NOTE — Telephone Encounter (Signed)
 Insurance has APPROVED the appeal for Repatha -    Thank you, Devere Pandy, PharmD Clinical Pharmacist  [place]  Direct Dial: [phone]

## 2024-06-03 NOTE — Telephone Encounter (Signed)
 Informed patient Via MyChart message his repatha  has been approved and to pick up his rx from pharmacy

## 2024-06-06 NOTE — Telephone Encounter (Signed)
 ATC patient to inform him his Repatha  is ready for pick up since patient has not read message on MyChart. LVM, closing encounter nothing further needed.

## 2024-06-07 ENCOUNTER — Telehealth: Payer: Self-pay

## 2024-06-07 NOTE — Telephone Encounter (Signed)
 Copied from CRM #[id number]. Topic: Clinical - Medication Prior Auth >> Jun 07, 2024  1:54 PM Rea C wrote: Reason for CRM: Patient called in and stated that he went to the pharmacy to pick up his prescription and was told that a PA needs to be submitted for Repatha  medicine.

## 2024-06-07 NOTE — Telephone Encounter (Signed)
 Called pharmacy and had them run the RX for Repatha  due to it was approved.  It went through and will get it ready for patient.   Called patient and notified him VIA phone. Dm/cma

## 2024-06-10 ENCOUNTER — Other Ambulatory Visit: Payer: Self-pay | Admitting: Family Medicine

## 2024-06-10 DIAGNOSIS — K219 Gastro-esophageal reflux disease without esophagitis: Secondary | ICD-10-CM

## 2024-06-21 DIAGNOSIS — K580 Irritable bowel syndrome with diarrhea: Principal | ICD-10-CM

## 2024-06-21 DIAGNOSIS — K91850 Pouchitis: Principal | ICD-10-CM

## 2024-06-21 MED ORDER — HYOSCYAMINE SULFATE 0.125 MG TABLET
ORAL_TABLET | 0.00000 days
Start: 2024-06-21 — End: ?

## 2024-06-22 MED ORDER — HYOSCYAMINE SULFATE 0.125 MG TABLET
ORAL_TABLET | ORAL | 2 refills | 0.00000 days | Status: CP
Start: 2024-06-22 — End: ?

## 2024-06-28 ENCOUNTER — Other Ambulatory Visit: Payer: Self-pay | Admitting: Family Medicine

## 2024-06-28 DIAGNOSIS — E782 Mixed hyperlipidemia: Secondary | ICD-10-CM

## 2024-07-05 ENCOUNTER — Other Ambulatory Visit

## 2024-07-05 DIAGNOSIS — E782 Mixed hyperlipidemia: Secondary | ICD-10-CM

## 2024-07-05 LAB — LIPID PANEL
Cholesterol: 131 mg/dL (ref 28–200)
HDL: 56.3 mg/dL
LDL Cholesterol: 61 mg/dL (ref 10–99)
NonHDL: 74.63
Total CHOL/HDL Ratio: 2
Triglycerides: 66 mg/dL (ref 10.0–149.0)
VLDL: 13.2 mg/dL (ref 0.0–40.0)

## 2024-07-05 LAB — HEPATIC FUNCTION PANEL
ALT: 12 U/L (ref 3–53)
AST: 18 U/L (ref 5–37)
Albumin: 4.4 g/dL (ref 3.5–5.2)
Alkaline Phosphatase: 66 U/L (ref 39–117)
Bilirubin, Direct: 0.1 mg/dL (ref 0.1–0.3)
Total Bilirubin: 0.7 mg/dL (ref 0.2–1.2)
Total Protein: 7.3 g/dL (ref 6.0–8.3)

## 2024-07-09 ENCOUNTER — Ambulatory Visit: Payer: Self-pay | Admitting: Family Medicine

## 2024-08-24 ENCOUNTER — Ambulatory Visit: Admitting: Family Medicine
